# Patient Record
Sex: Female | Born: 1960 | Race: White | Hispanic: No | Marital: Married | Smoking: Current every day smoker
Health system: Southern US, Community
[De-identification: ages and names within clinical notes are randomized; demographics above are authoritative.]

## PROBLEM LIST (undated history)

## (undated) DIAGNOSIS — F32A Depression, unspecified: Secondary | ICD-10-CM

## (undated) DIAGNOSIS — K5792 Diverticulitis of intestine, part unspecified, without perforation or abscess without bleeding: Secondary | ICD-10-CM

## (undated) DIAGNOSIS — G8929 Other chronic pain: Secondary | ICD-10-CM

## (undated) DIAGNOSIS — M545 Low back pain, unspecified: Secondary | ICD-10-CM

## (undated) DIAGNOSIS — E785 Hyperlipidemia, unspecified: Secondary | ICD-10-CM

## (undated) DIAGNOSIS — IMO0002 Reserved for concepts with insufficient information to code with codable children: Secondary | ICD-10-CM

## (undated) DIAGNOSIS — M329 Systemic lupus erythematosus, unspecified: Secondary | ICD-10-CM

## (undated) DIAGNOSIS — F419 Anxiety disorder, unspecified: Secondary | ICD-10-CM

## (undated) DIAGNOSIS — N3281 Overactive bladder: Secondary | ICD-10-CM

## (undated) DIAGNOSIS — F329 Major depressive disorder, single episode, unspecified: Secondary | ICD-10-CM

## (undated) DIAGNOSIS — N3090 Cystitis, unspecified without hematuria: Secondary | ICD-10-CM

## (undated) DIAGNOSIS — K219 Gastro-esophageal reflux disease without esophagitis: Secondary | ICD-10-CM

## (undated) DIAGNOSIS — M359 Systemic involvement of connective tissue, unspecified: Secondary | ICD-10-CM

## (undated) DIAGNOSIS — M542 Cervicalgia: Secondary | ICD-10-CM

## (undated) DIAGNOSIS — U071 COVID-19: Secondary | ICD-10-CM

## (undated) HISTORY — PX: COLONOSCOPY: SHX174

## (undated) HISTORY — PX: TONSILLECTOMY: SUR1361

## (undated) HISTORY — DX: Overactive bladder: N32.81

## (undated) HISTORY — PX: ABDOMINAL HYSTERECTOMY: SHX81

## (undated) HISTORY — DX: Cystitis, unspecified without hematuria: N30.90

---

## 2003-11-11 ENCOUNTER — Emergency Department: Payer: Self-pay | Admitting: Unknown Physician Specialty

## 2003-11-15 ENCOUNTER — Ambulatory Visit: Payer: Self-pay | Admitting: Unknown Physician Specialty

## 2004-11-02 ENCOUNTER — Emergency Department: Payer: Self-pay | Admitting: General Practice

## 2004-11-28 ENCOUNTER — Emergency Department: Payer: Self-pay | Admitting: Emergency Medicine

## 2006-04-23 ENCOUNTER — Emergency Department: Payer: Self-pay | Admitting: Emergency Medicine

## 2006-05-15 ENCOUNTER — Emergency Department: Payer: Self-pay | Admitting: Emergency Medicine

## 2012-01-07 DIAGNOSIS — F411 Generalized anxiety disorder: Secondary | ICD-10-CM | POA: Insufficient documentation

## 2012-02-18 DIAGNOSIS — M329 Systemic lupus erythematosus, unspecified: Secondary | ICD-10-CM | POA: Insufficient documentation

## 2012-06-04 DIAGNOSIS — M797 Fibromyalgia: Secondary | ICD-10-CM | POA: Insufficient documentation

## 2012-08-17 DIAGNOSIS — R32 Unspecified urinary incontinence: Secondary | ICD-10-CM | POA: Insufficient documentation

## 2012-08-17 DIAGNOSIS — G8929 Other chronic pain: Secondary | ICD-10-CM | POA: Insufficient documentation

## 2012-10-04 DIAGNOSIS — M1990 Unspecified osteoarthritis, unspecified site: Secondary | ICD-10-CM | POA: Insufficient documentation

## 2012-10-04 DIAGNOSIS — K5792 Diverticulitis of intestine, part unspecified, without perforation or abscess without bleeding: Secondary | ICD-10-CM | POA: Insufficient documentation

## 2013-11-11 DIAGNOSIS — L930 Discoid lupus erythematosus: Secondary | ICD-10-CM | POA: Insufficient documentation

## 2014-01-25 DIAGNOSIS — E785 Hyperlipidemia, unspecified: Secondary | ICD-10-CM | POA: Insufficient documentation

## 2014-11-24 DIAGNOSIS — Z0289 Encounter for other administrative examinations: Secondary | ICD-10-CM | POA: Insufficient documentation

## 2015-06-06 DIAGNOSIS — L930 Discoid lupus erythematosus: Secondary | ICD-10-CM | POA: Insufficient documentation

## 2015-06-06 DIAGNOSIS — Z79899 Other long term (current) drug therapy: Secondary | ICD-10-CM | POA: Insufficient documentation

## 2015-06-09 ENCOUNTER — Ambulatory Visit: Payer: Self-pay | Admitting: Family Medicine

## 2015-06-20 DIAGNOSIS — E7800 Pure hypercholesterolemia, unspecified: Secondary | ICD-10-CM | POA: Insufficient documentation

## 2015-09-07 ENCOUNTER — Other Ambulatory Visit: Payer: Self-pay | Admitting: Family Medicine

## 2015-09-07 DIAGNOSIS — G8929 Other chronic pain: Secondary | ICD-10-CM

## 2015-09-07 DIAGNOSIS — M542 Cervicalgia: Principal | ICD-10-CM

## 2015-09-07 DIAGNOSIS — M546 Pain in thoracic spine: Secondary | ICD-10-CM

## 2015-09-07 DIAGNOSIS — R9389 Abnormal findings on diagnostic imaging of other specified body structures: Secondary | ICD-10-CM

## 2015-09-20 ENCOUNTER — Ambulatory Visit
Admission: RE | Admit: 2015-09-20 | Discharge: 2015-09-20 | Disposition: A | Discharge status: Home / Self Care | Payer: Medicaid Other | Source: Ambulatory Visit | Attending: Family Medicine | Admitting: Family Medicine

## 2015-09-20 DIAGNOSIS — M542 Cervicalgia: Secondary | ICD-10-CM | POA: Insufficient documentation

## 2015-09-20 DIAGNOSIS — G8929 Other chronic pain: Secondary | ICD-10-CM

## 2015-09-20 DIAGNOSIS — M5124 Other intervertebral disc displacement, thoracic region: Secondary | ICD-10-CM | POA: Diagnosis not present

## 2015-09-20 DIAGNOSIS — R9389 Abnormal findings on diagnostic imaging of other specified body structures: Secondary | ICD-10-CM

## 2015-09-20 DIAGNOSIS — M519 Unspecified thoracic, thoracolumbar and lumbosacral intervertebral disc disorder: Secondary | ICD-10-CM | POA: Diagnosis not present

## 2015-09-20 DIAGNOSIS — R937 Abnormal findings on diagnostic imaging of other parts of musculoskeletal system: Secondary | ICD-10-CM | POA: Diagnosis not present

## 2015-09-20 DIAGNOSIS — M4802 Spinal stenosis, cervical region: Secondary | ICD-10-CM | POA: Diagnosis not present

## 2015-09-20 DIAGNOSIS — M546 Pain in thoracic spine: Secondary | ICD-10-CM

## 2015-10-09 ENCOUNTER — Ambulatory Visit
Admission: EM | Admit: 2015-10-09 | Discharge: 2015-10-09 | Disposition: A | Discharge status: Home / Self Care | Payer: Medicaid Other | Attending: Family Medicine | Admitting: Family Medicine

## 2015-10-09 DIAGNOSIS — M5090 Cervical disc disorder, unspecified, unspecified cervical region: Secondary | ICD-10-CM

## 2015-10-09 DIAGNOSIS — M542 Cervicalgia: Secondary | ICD-10-CM | POA: Diagnosis not present

## 2015-10-09 HISTORY — DX: Systemic lupus erythematosus, unspecified: M32.9

## 2015-10-09 HISTORY — DX: Major depressive disorder, single episode, unspecified: F32.9

## 2015-10-09 HISTORY — DX: Anxiety disorder, unspecified: F41.9

## 2015-10-09 HISTORY — DX: Depression, unspecified: F32.A

## 2015-10-09 HISTORY — DX: Hyperlipidemia, unspecified: E78.5

## 2015-10-09 HISTORY — DX: Reserved for concepts with insufficient information to code with codable children: IMO0002

## 2015-10-09 MED ORDER — CYCLOBENZAPRINE HCL 10 MG PO TABS: 10.0000 mg | ORAL_TABLET | Freq: Three times a day (TID) | ORAL | 0 refills | Status: DC | PRN

## 2015-10-09 MED ORDER — KETOROLAC TROMETHAMINE 60 MG/2ML IM SOLN
60.0000 mg | Freq: Once | INTRAMUSCULAR | Status: AC
  Administered 2015-10-09: 60 mg via INTRAMUSCULAR

## 2015-10-09 NOTE — ED Triage Notes (Signed)
Patient complains of anxiety and stress. Patient states that she went off of her Cymbalta cold [place] one month ago. Patient states that she is going to see her primary MD tomorrow about starting something else. Patient states that the neck pain has been a constant pain for a while and is scheduled to see a pain management provider in November. Patient states that she cannot handle the stress or pain any longer.

## 2015-10-09 NOTE — ED Provider Notes (Signed)
[facility]    CSN: [id number] Arrival date & time: 10/09/15  1242  First Provider Contact:  None       History   Chief Complaint Chief Complaint  Patient presents with  . Neck Pain  . Anxiety    HPI Tanya [name] is a 55 y.o. female.   The history is provided by the patient.  Neck Pain  Pain location:  Generalized neck Quality:  Stiffness and aching Stiffness is present:  All day Pain radiates to:  Head Pain severity:  Moderate Pain is:  Same all the time Onset quality:  Gradual Timing:  Constant Progression:  Worsening Chronicity:  Chronic Context: not fall, not jumping from heights, not lifting a heavy object, not MCA, not MVC, not pedestrian accident and not recent injury   Relieved by:  Nothing Associated symptoms: no bladder incontinence, no bowel incontinence, no headaches, no numbness, no paresis, no tingling and no weakness   Anxiety  Pertinent negatives include no headaches.    Past Medical History:  Diagnosis Date  . Anxiety   . Depression   . Hyperlipidemia   . Lupus ([place])     There are no active problems to display for this patient.   Past Surgical History:  Procedure Laterality Date  . ABDOMINAL HYSTERECTOMY      OB History    No data available       Home Medications    Prior to Admission medications   Medication Sig Start Date End Date Taking? Authorizing Provider  aspirin 81 MG chewable tablet Chew 81 mg by mouth daily.   Yes Historical Provider, MD  atorvastatin (LIPITOR) 40 MG tablet Take 40 mg by mouth daily.   Yes Historical Provider, MD  gabapentin (NEURONTIN) 300 MG capsule Take 300 mg by mouth 3 (three) times daily.   Yes Historical Provider, MD  hydrOXYzine (ATARAX/VISTARIL) 25 MG tablet Take 25 mg by mouth 3 (three) times daily as needed.   Yes Historical Provider, MD  cyclobenzaprine (FLEXERIL) 10 MG tablet Take 1 tablet (10 mg total) by mouth 3 (three) times daily as needed for muscle spasms. 10/09/15    Norval Gable, MD    Family History History reviewed. No pertinent family history.  Social History Social History  Substance Use Topics  . Smoking status: Current Every Day Smoker    Packs/day: 0.50    Types: Cigarettes  . Smokeless tobacco: Never Used  . Alcohol use No     Allergies   Trazodone and nefazodone and Tramadol   Review of Systems Review of Systems  Gastrointestinal: Negative for bowel incontinence.  Genitourinary: Negative for bladder incontinence.  Musculoskeletal: Positive for neck pain.  Neurological: Negative for tingling, weakness, numbness and headaches.     Physical Exam Triage Vital Signs ED Triage Vitals  Enc Vitals Group     BP 10/09/15 1313 (!) 149/92     Pulse Rate 10/09/15 1313 88     Resp 10/09/15 1313 18     Temp 10/09/15 1313 97.2 F (36.2 C)     Temp Source 10/09/15 1313 Tympanic     SpO2 10/09/15 1313 99 %     Weight 10/09/15 1315 145 lb (65.8 kg)     Height 10/09/15 1315 5\' 3"  (1.6 m)     Head Circumference --      Peak Flow --      Pain Score 10/09/15 1321 10     Pain Loc --      Pain  Edu? --      Excl. in [place]? --    No data found.   Updated Vital Signs BP (!) 149/92 (BP Location: Left Arm)   Pulse 88   Temp 97.2 F (36.2 C) (Tympanic)   Resp 18   Ht 5\' 3"  (1.6 m)   Wt 145 lb (65.8 kg)   SpO2 99%   BMI 25.69 kg/m   Visual Acuity Right Eye Distance:   Left Eye Distance:   Bilateral Distance:    Right Eye Near:   Left Eye Near:    Bilateral Near:     Physical Exam  Constitutional: She appears well-developed and well-nourished. No distress.  Musculoskeletal:       Cervical back: She exhibits tenderness (over the paraspinous muscles bilaterally) and spasm. She exhibits normal range of motion, no bony tenderness, no swelling, no edema, no deformity, no laceration, no pain and normal pulse.  Skin: She is not diaphoretic.  Nursing note and vitals reviewed.    UC Treatments / Results  Labs (all labs ordered  are listed, but only abnormal results are displayed) Labs Reviewed - No data to display  EKG  EKG Interpretation None       Radiology No results found.  Procedures Procedures (including critical care time)  Medications Ordered in UC Medications  ketorolac (TORADOL) injection 60 mg (60 mg Intramuscular Given 10/09/15 1404)     Initial Impression / Assessment and Plan / UC Course  I have reviewed the triage vital signs and the nursing notes.  Pertinent labs & imaging results that were available during my care of the patient were reviewed by me and considered in my medical decision making (see chart for details).  Clinical Course      Final Clinical Impressions(s) / UC Diagnoses   Final diagnoses:  Neck pain  Cervical disc disease    New Prescriptions Discharge Medication List as of 10/09/2015  2:30 PM    START taking these medications   Details  cyclobenzaprine (FLEXERIL) 10 MG tablet Take 1 tablet (10 mg total) by mouth 3 (three) times daily as needed for muscle spasms., Starting Mon 10/09/2015, Normal       1. diagnosis reviewed with patient; patient given toradol 60mg  IM x1 with improvement of symptoms 2. rx as per orders above; reviewed possible side effects, interactions, risks and benefits  3. Recommend supportive treatment with otc analgesics 4. Follow-up prn if symptoms worsen or don't improve   Norval Gable, MD 10/09/15 2115

## 2015-11-23 ENCOUNTER — Encounter: Payer: Self-pay | Admitting: Pain Medicine

## 2015-11-23 ENCOUNTER — Ambulatory Visit: Payer: Medicaid Other | Attending: Pain Medicine | Admitting: Pain Medicine

## 2015-11-23 ENCOUNTER — Other Ambulatory Visit
Admission: RE | Admit: 2015-11-23 | Discharge: 2015-11-23 | Disposition: A | Discharge status: Home / Self Care | Payer: Medicaid Other | Source: Ambulatory Visit | Attending: Pain Medicine | Admitting: Pain Medicine

## 2015-11-23 ENCOUNTER — Ambulatory Visit
Admission: RE | Admit: 2015-11-23 | Discharge: 2015-11-23 | Disposition: A | Discharge status: Home / Self Care | Payer: Medicaid Other | Source: Ambulatory Visit | Attending: Pain Medicine | Admitting: Pain Medicine

## 2015-11-23 VITALS — BP 128/80 | HR 80 | Temp 97.7°F | Resp 16 | Ht 62.0 in | Wt 145.0 lb

## 2015-11-23 DIAGNOSIS — Z0001 Encounter for general adult medical examination with abnormal findings: Secondary | ICD-10-CM | POA: Insufficient documentation

## 2015-11-23 DIAGNOSIS — M25512 Pain in left shoulder: Secondary | ICD-10-CM

## 2015-11-23 DIAGNOSIS — Z79899 Other long term (current) drug therapy: Secondary | ICD-10-CM | POA: Diagnosis not present

## 2015-11-23 DIAGNOSIS — M150 Primary generalized (osteo)arthritis: Secondary | ICD-10-CM

## 2015-11-23 DIAGNOSIS — M79602 Pain in left arm: Secondary | ICD-10-CM

## 2015-11-23 DIAGNOSIS — F1190 Opioid use, unspecified, uncomplicated: Secondary | ICD-10-CM

## 2015-11-23 DIAGNOSIS — M25511 Pain in right shoulder: Secondary | ICD-10-CM | POA: Diagnosis not present

## 2015-11-23 DIAGNOSIS — M19011 Primary osteoarthritis, right shoulder: Secondary | ICD-10-CM | POA: Insufficient documentation

## 2015-11-23 DIAGNOSIS — M159 Polyosteoarthritis, unspecified: Secondary | ICD-10-CM

## 2015-11-23 DIAGNOSIS — M79603 Pain in arm, unspecified: Secondary | ICD-10-CM

## 2015-11-23 DIAGNOSIS — M545 Low back pain, unspecified: Secondary | ICD-10-CM | POA: Insufficient documentation

## 2015-11-23 DIAGNOSIS — M549 Dorsalgia, unspecified: Secondary | ICD-10-CM

## 2015-11-23 DIAGNOSIS — M5412 Radiculopathy, cervical region: Secondary | ICD-10-CM | POA: Insufficient documentation

## 2015-11-23 DIAGNOSIS — M797 Fibromyalgia: Secondary | ICD-10-CM | POA: Insufficient documentation

## 2015-11-23 DIAGNOSIS — Z79891 Long term (current) use of opiate analgesic: Secondary | ICD-10-CM | POA: Insufficient documentation

## 2015-11-23 DIAGNOSIS — F419 Anxiety disorder, unspecified: Secondary | ICD-10-CM | POA: Insufficient documentation

## 2015-11-23 DIAGNOSIS — G8929 Other chronic pain: Secondary | ICD-10-CM | POA: Insufficient documentation

## 2015-11-23 DIAGNOSIS — G894 Chronic pain syndrome: Secondary | ICD-10-CM | POA: Diagnosis not present

## 2015-11-23 DIAGNOSIS — M542 Cervicalgia: Secondary | ICD-10-CM

## 2015-11-23 DIAGNOSIS — M19012 Primary osteoarthritis, left shoulder: Secondary | ICD-10-CM | POA: Diagnosis not present

## 2015-11-23 DIAGNOSIS — M546 Pain in thoracic spine: Secondary | ICD-10-CM | POA: Insufficient documentation

## 2015-11-23 DIAGNOSIS — M79601 Pain in right arm: Secondary | ICD-10-CM

## 2015-11-23 LAB — C-REACTIVE PROTEIN

## 2015-11-23 LAB — COMPREHENSIVE METABOLIC PANEL
ALK PHOS: 35 U/L — AB (ref 38–126)
ALT: 15 U/L (ref 14–54)
ANION GAP: 12 (ref 5–15)
AST: 23 U/L (ref 15–41)
Albumin: 4.6 g/dL (ref 3.5–5.0)
BUN: 10 mg/dL (ref 6–20)
CALCIUM: 9.5 mg/dL (ref 8.9–10.3)
CO2: 24 mmol/L (ref 22–32)
CREATININE: 0.67 mg/dL (ref 0.44–1.00)
Chloride: 102 mmol/L (ref 101–111)
Glucose, Bld: 79 mg/dL (ref 65–99)
Potassium: 4 mmol/L (ref 3.5–5.1)
SODIUM: 138 mmol/L (ref 135–145)
Total Bilirubin: 0.5 mg/dL (ref 0.3–1.2)
Total Protein: 7.6 g/dL (ref 6.5–8.1)

## 2015-11-23 LAB — SEDIMENTATION RATE: SED RATE: 8 mm/h (ref 0–30)

## 2015-11-23 LAB — MAGNESIUM: MAGNESIUM: 2.1 mg/dL (ref 1.7–2.4)

## 2015-11-23 LAB — VITAMIN B12: Vitamin B-12: 318 pg/mL (ref 180–914)

## 2015-11-23 NOTE — Patient Instructions (Signed)

## 2015-11-23 NOTE — Progress Notes (Signed)
Patient's Name: Tanya [name]  MRN: [id number]  Referring Provider: Ricardo [name]*  DOB: [birth year]  PCP: Tanya Mouse, NP  DOS: 11/23/2015  Note by: Kathlen Brunswick. Dossie Arbour, MD  Service setting: Ambulatory outpatient  Specialty: Interventional Pain Management  Location: [facility] (AMB) Pain Management Facility    Patient type: New Patient   Primary Reason(s) for Visit: Initial Patient Evaluation CC: Neck Pain; Back Pain (all of back); and Shoulder Pain (bilateral right is worse)  HPI  [name] is a 55 y.o. year old, female patient, who comes today for an initial evaluation. She has Anxiety; Chronic upper back pain (Location of Tertiary source of pain) (Bilateral) (R>L); Chronic discoid lupus erythematosus; Chronic low back pain (Bilateral) (R>L); Chronic neck pain (Location of Secondary source of pain) (Bilateral) (R>L); Discoid lupus; Diverticulitis; Encounter for long-term (current) use of high-risk medication; Fibromyalgia; Generalized anxiety disorder; Hypercholesterolemia; Hyperlipidemia; Major depressive disorder, recurrent ([place]); Pain medication agreement signed; Osteoarthritis; SLE (systemic lupus erythematosus) ([place]); Urinary incontinence; Long term prescription benzodiazepine use; Long term current use of opiate analgesic; Long term prescription opiate use; Opiate use (80 MME/Day); Chronic pain syndrome; Chronic upper extremity pain (Right); Chronic cervical radicular pain (Right); Chronic shoulder pain (Location of Primary Source of Pain) (Bilateral) (R>L); and 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 Her primarily concern today is the Neck Pain; Back Pain (all of back); and Shoulder Pain (bilateral right is worse)  Pain Assessment: Self-Reported Pain Score: 5 /10             Reported level is compatible with observation.       Pain Type: Chronic pain Pain Location: Neck (entire back) Pain Orientation: Right Pain Descriptors / Indicators: Numbness,  Constant, Burning, Aching Pain Frequency: Constant  Onset and Duration: Gradual and Date of onset: 2012 Cause of pain: Unknown Severity: Getting worse, NAS-11 at its worse: 10/10, NAS-11 at its best: 7/10, NAS-11 now: 10/10 and NAS-11 on the average: 9/10 Timing: Not influenced by the time of the day and During activity or exercise Aggravating Factors: sitting Alleviating Factors: Lying down Associated Problems: Depression, Inability to concentrate, Numbness, Sadness, Spasms, Sweating, Tingling and Pain that wakes patient up Quality of Pain: Aching, Burning, Deep, Throbbing and Tingling Previous Examinations or Tests: MRI scan and X-rays Previous Treatments: The patient denies treatments  The patient comes into the clinics today for the first time for a chronic pain management evaluation. The patient describes the primary pain to be that of the shoulders with the right side being worst on the left. This pain started around 2012. The onset was gradual and the etiology was unknown. The patient denies having had any shoulder surgeries or nerve blocks. The patient's second worst pain is that of the neck which is located in the posterior aspect of the neck and with the right side being worst on the left. She denies any surgeries or nerve blocks. The patient indicates having headaches through the posterior and lateral aspects of the head with the right side being worse than the left in the back and the left being worst on the right in the sites. Next is the upper back pain which is worse on the right side. The patient denies any prior surgeries or nerve blocks. Next is the low back pain with the right being worst on the left no prior surgeries but the patient does admit to having had nerve blocks at [facility]. She indicates having had some lumbar epidural steroid injections which  did not help but she also indicated no side effects from them. In addition to this the patient complains of right upper  extremity pain down to the elbow to the back of the upper arm and through the inside portion of the upper arm she indicates having some numbness and weakness with decreased grip strength.  When asked about the medications the patient indicated taking 900 mg of gabapentin 3 times a day, to extra strength Tylenol, Flexeril 10 2-3 tablets per day, meloxicam 15 mg daily, and seemed about the 30 mg by mouth daily. She denies any prior physical therapy.  Today I took the time to provide the patient with information regarding my pain practice. The patient was informed that my practice is divided into two sections: an interventional pain management section, as well as a completely separate and distinct medication management section. The interventional portion of my practice takes place on Tuesdays and Thursdays, while the medication management is conducted on Mondays and Wednesdays. Because of the amount of documentation required on both them, they are kept separated. This means that there is the possibility that the patient may be scheduled for a procedure on Tuesday, while also having a medication management appointment on Wednesday. I have also informed the patient that because of current staffing and facility limitations, I no longer take patients for medication management only. To illustrate the reasons for this, I gave the patient the example of a surgeon and how inappropriate it would be to refer a patient to his/her practice so that they write for the post-procedure antibiotics on a surgery done by someone else.   The patient was informed that joining my practice means that they are open to any and all interventional therapies. I clarified for the patient that this does not mean that they will be forced to have any procedures done. What it means is that patients looking for a practitioner to simply write for their pain medications and not take advantage of other interventional techniques will be better served  by a different practitioner, other than myself. I made it clear that I prefer to spend my time providing those services that I specialize in.  The patient was also made aware of my Comprehensive Pain Management Safety Guidelines where by joining my practice, they limit all of their nerve blocks and joint injections to those done by our practice, for as long as we are retained to manage their care.   Historic Controlled Substance Pharmacotherapy Review  PMP and historical list of controlled substances: Hydrocodone/APAP 5/325; diazepam 2 mg; oxycodone 5 mg; oxycodone/APAP 5/325; alprazolam 0.5 mg; tramadol 50 mg to my: And lorazepam 0.5 mg Highest analgesic regimen found: Hydrocodone/APAP 5/325 16 tablets per day. Most recent analgesic: Hydrocodone/APAP 5/325 Highest recorded MME/day: 80 mg/day MME/day: 0 mg/day Medications: The patient did not bring the medication(s) to the appointment, as requested in our "New Patient Package" Pharmacodynamics: Desired effects: Analgesia: The patient reports >50% benefit. Reported improvement in function: The patient reports medication allows her to accomplish basic ADLs. Clinically meaningful improvement in function (CMIF): Sustained CMIF goals met Perceived effectiveness: Described as relatively effective, allowing for increase in activities of daily living (ADL) Undesirable effects: Side-effects or Adverse reactions: None reported Historical Monitoring: The patient  reports that she does not use drugs.. No results found for: MDMA, COCAINSCRNUR, PCPSCRNUR, THCU, ETH Historical Background Evaluation: [place] PDMP: Six (6) year initial data search conducted.             [place] Department of  public safety, offender search: Editor, commissioning Information) Non-contributory Risk Assessment Profile: Aberrant behavior: None observed or detected today Risk factors for fatal opioid overdose: None identified today Fatal overdose hazard ratio (HR): Calculation deferred Non-fatal  overdose hazard ratio (HR): Calculation deferred Risk of opioid abuse or dependence: 0.7-3.0% with doses ? 36 MME/day and 6.1-26% with doses ? 120 MME/day. Substance use disorder (SUD) risk level: Pending results of Medical Psychology Evaluation for SUD Opioid risk tool (ORT) (Total Score): 4  ORT Scoring interpretation table:  Score <3 = Low Risk for SUD  Score between 4-7 = Moderate Risk for SUD  Score >8 = High Risk for Opioid Abuse   PHQ-2 Depression Scale:  Total score: 0  PHQ-2 Scoring interpretation table: (Score and probability of major depressive disorder)  Score 0 = No depression  Score 1 = 15.4% Probability  Score 2 = 21.1% Probability  Score 3 = 38.4% Probability  Score 4 = 45.5% Probability  Score 5 = 56.4% Probability  Score 6 = 78.6% Probability   PHQ-9 Depression Scale:  Total score: 0  PHQ-9 Scoring interpretation table:  Score 0-4 = No depression  Score 5-9 = Mild depression  Score 10-14 = Moderate depression  Score 15-19 = Moderately severe depression  Score 20-27 = Severe depression (2.4 times higher risk of SUD and 2.89 times higher risk of overuse)   Pharmacologic Plan: Pending ordered tests and/or consults  Meds  The patient has a current medication list which includes the following prescription(s): atorvastatin, cyclobenzaprine, duloxetine, gabapentin, and meloxicam.  Current Outpatient Prescriptions on File Prior to Visit  Medication Sig  . atorvastatin (LIPITOR) 40 MG tablet Take 40 mg by mouth daily.  . cyclobenzaprine (FLEXERIL) 10 MG tablet Take 1 tablet (10 mg total) by mouth 3 (three) times daily as needed for muscle spasms.  Marland Kitchen gabapentin (NEURONTIN) 300 MG capsule Take 300 mg by mouth 3 (three) times daily.   No current facility-administered medications on file prior to visit.    Imaging Review  Cervical Imaging: Cervical MR wo contrast:  Results for orders placed during the hospital encounter of 09/20/15  MR Cervical Spine Wo Contrast    Narrative CLINICAL DATA:  Long history of neck and back pain.  EXAM: MRI CERVICAL, THORACIC  WITHOUT CONTRAST  TECHNIQUE: Multiplanar and multiecho pulse sequences of the cervical spine, to include the craniocervical junction and cervicothoracic junction and thoracic spine were obtained without intravenous contrast.  COMPARISON:  None.  FINDINGS: MRI CERVICAL SPINE FINDINGS  Examination is limited due to patient motion and poor image quality.  Alignment: Normal.  Vertebrae: Grossly normal.  Cord: Grossly normal.  Posterior Fossa, vertebral arteries, paraspinal tissues: No significant findings.  Disc levels:  C2-3:  No significant findings.  C3-4: Shallow central disc protrusion with mild mass effect on the ventral thecal sac. Mild narrowing of the ventral CSF space. Mild right foraminal narrowing due to uncinate spurring.  C4-5: Bulging annulus, osteophytic ridging and uncinate spurring with mass effect on the ventral thecal sac, asymmetric right. Narrowing of the ventral CSF space. Moderate bilateral foraminal stenosis.  C5-6: Shallow central disc protrusion, osteophytic ridging and uncinate spurring with flattening of the ventral thecal sac and narrowing of the ventral CSF space. Moderate left and mild right foraminal stenosis.  C6-7: Broad-based disc protrusion asymmetrical left with mass effect on the thecal sac and narrowing of the ventral CSF space. No significant foraminal stenosis.  C7-T1: No significant findings.  MRI THORACIC SPINE FINDINGS  Alignment:  Normal  Vertebrae: Normal  Cord:  Normal  Paraspinal and other soft tissues: Unremarkable  Disc levels:  No significant thoracic disc protrusions, spinal or foraminal stenosis. Shallow central disc protrusion noted at T7-8 with minimal impression on the ventral thecal sac.  IMPRESSION: 1. Multilevel disc disease in the cervical spine with disc protrusions, bulging degenerated discs,  osteophytic ridging and uncinate spurring with mild multilevel spinal and foraminal stenosis as discussed above at the individual levels. 2. Normal thoracic spine examination except for mild disc disease and facet disease and a shallow central disc protrusion at T7-8.   Electronically Signed   By: Marijo Sanes M.D.   On: 09/20/2015 11:04    Thoracic Imaging: Thoracic MR wo contrast:  Results for orders placed during the hospital encounter of 09/20/15  MR Thoracic Spine Wo Contrast   Narrative CLINICAL DATA:  Long history of neck and back pain.  EXAM: MRI CERVICAL, THORACIC  WITHOUT CONTRAST  TECHNIQUE: Multiplanar and multiecho pulse sequences of the cervical spine, to include the craniocervical junction and cervicothoracic junction and thoracic spine were obtained without intravenous contrast.  COMPARISON:  None.  FINDINGS: MRI CERVICAL SPINE FINDINGS  Examination is limited due to patient motion and poor image quality.  Alignment: Normal.  Vertebrae: Grossly normal.  Cord: Grossly normal.  Posterior Fossa, vertebral arteries, paraspinal tissues: No significant findings.  Disc levels:  C2-3:  No significant findings.  C3-4: Shallow central disc protrusion with mild mass effect on the ventral thecal sac. Mild narrowing of the ventral CSF space. Mild right foraminal narrowing due to uncinate spurring.  C4-5: Bulging annulus, osteophytic ridging and uncinate spurring with mass effect on the ventral thecal sac, asymmetric right. Narrowing of the ventral CSF space. Moderate bilateral foraminal stenosis.  C5-6: Shallow central disc protrusion, osteophytic ridging and uncinate spurring with flattening of the ventral thecal sac and narrowing of the ventral CSF space. Moderate left and mild right foraminal stenosis.  C6-7: Broad-based disc protrusion asymmetrical left with mass effect on the thecal sac and narrowing of the ventral CSF space. No significant  foraminal stenosis.  C7-T1: No significant findings.  MRI THORACIC SPINE FINDINGS  Alignment:  Normal  Vertebrae: Normal  Cord:  Normal  Paraspinal and other soft tissues: Unremarkable  Disc levels:  No significant thoracic disc protrusions, spinal or foraminal stenosis. Shallow central disc protrusion noted at T7-8 with minimal impression on the ventral thecal sac.  IMPRESSION: 1. 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 2. Normal thoracic spine examination except for mild disc disease and facet disease and a shallow central disc protrusion at T7-8.   Electronically Signed   By: Marijo Sanes M.D.   On: 09/20/2015 11:04    Note: Imaging results reviewed.  ROS  Cardiovascular History: Negative for hypertension, coronary artery diseas, myocardial infraction, anticoagulant therapy or heart failure Pulmonary or Respiratory History: Negative for bronchial asthma, emphysema, chronic smoking, chronic bronchitis, sarcoidosis, tuberculosis or sleep apena Neurological History: Negative for epilepsy, stroke, urinary or fecal inontinence, spina bifida or tethered cord syndrome Review of Past Neurological Studies: No results found for this or any previous visit. Psychological-Psychiatric History: Anxiety, Depression and Panic Attacks Gastrointestinal History: Negative for peptic ulcer disease, hiatal hernia, GERD, IBS, hepatitis, cirrhosis or pancreatitis Genitourinary History: Negative for nephrolithiasis, hematuria, renal failure or chronic kidney disease Hematological History: Negative for anticoagulant therapy, anemia, bruising  or bleeding easily, hemophilia, sickle cell disease or trait, thrombocytopenia or coagulupathies Endocrine History: Negative for diabetes or thyroid  disease Rheumatologic History: Lupus, Rheumatoid arthritis and Fibromyalgia Musculoskeletal History: Negative for myasthenia gravis, muscular dystrophy, multiple sclerosis or malignant hyperthermia Work History: Disabled  Allergies  Ms. Lingenfelter is allergic to trazodone and nefazodone and tramadol.  Laboratory Chemistry  Inflammation Markers Lab Results  Component Value Date   ESRSEDRATE 8 11/23/2015   CRP <0.8 11/23/2015   Renal Function Lab Results  Component Value Date   BUN 10 11/23/2015   CREATININE 0.67 11/23/2015   GFRAA >60 11/23/2015   GFRNONAA >60 11/23/2015   Hepatic Function Lab Results  Component Value Date   AST 23 11/23/2015   ALT 15 11/23/2015   ALBUMIN 4.6 11/23/2015   Electrolytes Lab Results  Component Value Date   NA 138 11/23/2015   K 4.0 11/23/2015   CL 102 11/23/2015   CALCIUM 9.5 11/23/2015   MG 2.1 11/23/2015   Pain Modulating Vitamins Lab Results  Component Value Date   25OHVITD1 28 (L) 11/23/2015   25OHVITD2 <1.0 11/23/2015   25OHVITD3 28 11/23/2015   VITAMINB12 318 11/23/2015   Coagulation Parameters No results found for: INR, LABPROT, APTT, PLT Cardiovascular No results found for: BNP, HGB, HCT Note: Lab results reviewed.  [place]  Drug: Ms. Wolaver  reports that she does not use drugs. Alcohol:  reports that she does not drink alcohol. Tobacco:  reports that she has been smoking Cigarettes.  She has been smoking about 0.50 packs per day. She has never used smokeless tobacco. Medical:  has a past medical history of Anxiety; Depression; Hyperlipidemia; and Lupus. Family: family history includes Cancer in her mother; Diabetes in her brother; Emphysema in her mother; Glaucoma in her father; Heart disease in her father; Hypertension in her mother; Stroke in her mother.  Past Surgical History:  Procedure Laterality Date  . ABDOMINAL HYSTERECTOMY     Active Ambulatory Problems    Diagnosis Date Noted  . Anxiety 11/23/2015  .  Chronic upper back pain (Location of Tertiary source of pain) (Bilateral) (R>L) 08/17/2012  . 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 Chronic low back pain (Bilateral) (R>L) 11/23/2015  . Chronic neck pain (Location of Secondary source of pain) (Bilateral) (R>L) 11/23/2015  . 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 Diverticulitis 10/04/2012  . Encounter for long-term (current) use of high-risk medication 06/06/2015  . Fibromyalgia 06/04/2012  . Generalized anxiety disorder 01/07/2012  . Hypercholesterolemia 06/20/2015  . Hyperlipidemia 01/25/2014  . Major depressive disorder, recurrent ([place]) 09/29/2009  . Pain medication agreement signed 11/24/2014  . Osteoarthritis 10/04/2012  . SLE (systemic lupus erythematosus) ([place]) 02/18/2012  . Urinary incontinence 08/17/2012  . Long term prescription benzodiazepine use 11/23/2015  . Long term current use of opiate analgesic 11/23/2015  . Long term prescription opiate use 11/23/2015  . Opiate use (80 MME/Day) 11/23/2015  . Chronic pain syndrome 11/23/2015  . Chronic upper extremity pain (Right) 11/23/2015  . Chronic cervical radicular pain (Right) 11/23/2015  . Chronic shoulder pain (Location of Primary Source of Pain) (Bilateral) (R>L) 11/23/2015  . 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 No Resolved Ambulatory Problems   Past Medical History:  Diagnosis Date  . Anxiety   . Depression   . Hyperlipidemia   . Lupus    Constitutional Exam  General appearance: Well nourished, well developed, and well hydrated. In no apparent acute distress  Vitals:   11/23/15 1150  BP: 128/80  Pulse: 80  Resp: 16  Temp: 97.7 F (36.5 C)  TempSrc: Oral  SpO2: 97%  Weight: 145 lb (65.8 kg)  Height: 5' 2"  (1.575 m)   BMI Assessment: Estimated body mass index is 26.52 kg/m as calculated from the following:   Height as of this encounter: 5' 2"  (1.575 m).   Weight as of this  encounter: 145 lb (65.8 kg).  BMI interpretation table: BMI level Category Range association with higher incidence of chronic pain  <18 kg/m2 Underweight   18.5-24.9 kg/m2 Ideal body weight   25-29.9 kg/m2 Overweight Increased incidence by 20%  30-34.9 kg/m2 Obese (Class I) Increased incidence by 68%  35-39.9 kg/m2 Severe obesity (Class II) Increased incidence by 136%  >40 kg/m2 Extreme obesity (Class III) Increased incidence by 254%   BMI Readings from Last 4 Encounters:  11/23/15 26.52 kg/m  10/09/15 25.69 kg/m   Wt Readings from Last 4 Encounters:  11/23/15 145 lb (65.8 kg)  10/09/15 145 lb (65.8 kg)  Psych/Mental status: Alert, oriented x 3 (person, place, & time) Eyes: PERLA Respiratory: No evidence of acute respiratory distress  Cervical Spine Exam  Inspection: No masses, redness, or swelling Alignment: Symmetrical Functional ROM: Unrestricted ROM Stability: No instability detected Muscle strength & Tone: Functionally intact Sensory: Unimpaired Palpation: Non-contributory  Upper Extremity (UE) Exam    Side: Right upper extremity  Side: Left upper extremity  Inspection: No masses, redness, swelling, or asymmetry  Inspection: No masses, redness, swelling, or asymmetry  Functional ROM: Unrestricted ROM         Functional ROM: Unrestricted ROM          Muscle strength & Tone: Functionally intact  Muscle strength & Tone: Functionally intact  Sensory: Unimpaired  Sensory: Unimpaired  Palpation: Non-contributory  Palpation: Non-contributory   Thoracic Spine Exam  Inspection: No masses, redness, or swelling Alignment: Symmetrical Functional ROM: Unrestricted ROM Stability: No instability detected Sensory: Unimpaired Muscle strength & Tone: Functionally intact Palpation: Non-contributory  Lumbar Spine Exam  Inspection: No masses, redness, or swelling Alignment: Symmetrical Functional ROM: Unrestricted ROM Stability: No instability detected Muscle strength & Tone:  Functionally intact Sensory: Unimpaired Palpation: Non-contributory Provocative Tests: Lumbar Hyperextension and rotation test: evaluation deferred today       Patrick's Maneuver: evaluation deferred today              Gait & Posture Assessment  Ambulation: Unassisted Gait: Relatively normal for age and body habitus Posture: WNL   Lower Extremity Exam    Side: Right lower extremity  Side: Left lower extremity  Inspection: No masses, redness, swelling, or asymmetry  Inspection: No masses, redness, swelling, or asymmetry  Functional ROM: Unrestricted ROM          Functional ROM: Unrestricted ROM          Muscle strength & Tone: Functionally intact  Muscle strength & Tone: Functionally intact  Sensory: Unimpaired  Sensory: Unimpaired  Palpation: Non-contributory  Palpation: Non-contributory   Assessment  Primary Diagnosis & Pertinent Problem List: The primary encounter diagnosis was Chronic pain syndrome. Diagnoses of Long term prescription benzodiazepine use, Long term current use of opiate analgesic, Long term prescription opiate use, Opiate use, Chronic neck pain, Chronic pain of both upper extremities, Chronic cervical radicular pain, Chronic pain of both shoulders, Chronic upper back pain, Chronic bilateral low back pain without sciatica, Fibromyalgia, Primary osteoarthritis involving multiple joints, and Primary osteoarthritis of both shoulders were also pertinent to this  visit.  Visit Diagnosis: 1. Chronic pain syndrome   2. Long term prescription benzodiazepine use   3. Long term current use of opiate analgesic   4. Long term prescription opiate use   5. Opiate use   6. Chronic neck pain   7. Chronic pain of both upper extremities   8. Chronic cervical radicular pain   9. Chronic pain of both shoulders   10. Chronic upper back pain   11. Chronic bilateral low back pain without sciatica   12. Fibromyalgia   13. Primary osteoarthritis involving multiple joints   14. Primary  osteoarthritis of both shoulders    Plan of Care  Initial Treatment Plan:  Please be advised that as per protocol, today's visit has been an evaluation only. We have not taken over the patient's controlled substance management.  Problem-Specific Plan: No problem-specific Assessment & Plan notes found for this encounter.  Ordered Lab-work, Procedure(s), Referral(s), & Consult(s): Orders Placed This Encounter  Procedures  . DG Shoulder Left  . DG Shoulder Right  . Compliance Drug Analysis, Ur  . Comprehensive metabolic panel  . C-reactive protein  . Magnesium  . Sedimentation rate  . Vitamin B12  . 25-Hydroxyvitamin D Lcms D2+D3  . Ambulatory referral to Psychology   Pharmacotherapy: Medications ordered:  No orders of the defined types were placed in this encounter.  Medications administered during this visit: Ms. Machnik had no medications administered during this visit.   Pharmacotherapy under consideration:  Opioid Analgesics: The patient was informed that there is no guarantee that she would be a candidate for opioid analgesics. The decision will be made following CDC guidelines. This decision will be based on the results of diagnostic studies, as well as Ms. Stryker's risk profile.    Interventional therapies: Ms. Brickhouse was informed that there is no guarantee that she would be a candidate for interventional therapies. The decision will be based on the results of diagnostic studies, as well as Ms. Regis's risk profile.  Procedures under consideration include: Diagnostic bilateral intra-articular shoulder joint injection  Diagnostic bilateral suprascapular nerve block  Possible bilateral suprascapular nerve RFA  Right cervical epidural steroid injection  Diagnostic bilateral cervical facet block possible bilateral cervical facet radiofrequency ablation  Diagnostic bilateral lumbar facet block  Possible bilateral lumbar facet radiofrequency ablation     Requested PM Follow-up: Return for 2nd Visit Eval, After [facility]  Future Appointments Date Time Provider Phillips  12/21/2015 2:00 PM Milinda Pointer, MD [facility] None    Primary Care Physician: Tanya Mouse, NP Location: [facility] Note by: Kathlen Brunswick. Dossie Arbour, M.D, DABA, DABAPM, DABPM, DABIPP, FIPP  Pain Score Disclaimer: We use the NRS-11 scale. This is a self-reported, subjective measurement of pain severity with only modest accuracy. It is used primarily to identify changes within a particular patient. It must be understood that outpatient pain scales are significantly less accurate that those used for research, where they can be applied under ideal controlled circumstances with minimal exposure to variables. In reality, the score is likely to be a combination of pain intensity and pain affect, where pain affect describes the degree of emotional arousal or changes in action readiness caused by the sensory experience of pain. Factors such as social and work situation, setting, emotional state, anxiety levels, expectation, and prior pain experience may influence pain perception and show large inter-individual differences that may also be affected by time variables.  Patient instructions provided during this appointment: Patient Instructions  Pain  Management Discharge Instructions  General Discharge Instructions :  If you need to reach your doctor call: Monday-Friday 8:00 am - 4:00 pm at [phone] or toll free [phone].  After clinic hours [phone] to have operator reach doctor.  Bring all of your medication bottles to all your appointments in the pain clinic.  To cancel or reschedule your appointment with Pain Management please remember to call 24 hours in advance to avoid a fee.  Refer to the educational materials which you have been given on: General Risks, I had my Procedure. Discharge Instructions, Post  Sedation.  Post Procedure Instructions:  The drugs you were given will stay in your system until tomorrow, so for the next 24 hours you should not drive, make any legal decisions or drink any alcoholic beverages.  You may eat anything you prefer, but it is better to start with liquids then soups and crackers, and gradually work up to solid foods.  Please notify your doctor immediately if you have any unusual bleeding, trouble breathing or pain that is not related to your normal pain.  Depending on the type of procedure that was done, some parts of your body may feel week and/or numb.  This usually clears up by tonight or the next day.  Walk with the use of an assistive device or accompanied by an adult for the 24 hours.  You may use ice on the affected area for the first 24 hours.  Put ice in a Ziploc bag and cover with a towel and place against area 15 minutes on 15 minutes off.  You may switch to heat after 24 hours.

## 2015-11-23 NOTE — Progress Notes (Signed)
Safety precautions to be maintained throughout the outpatient stay will include: orient to surroundings, keep bed in low position, maintain call bell within reach at all times, provide assistance with transfer out of bed and ambulation.  

## 2015-11-26 LAB — 25-HYDROXYVITAMIN D LCMS D2+D3: 25-HYDROXY, VITAMIN D-3: 28 ng/mL

## 2015-11-26 LAB — 25-HYDROXY VITAMIN D LCMS D2+D3
25-Hydroxy, Vitamin D-2: <1 ng/mL
25-Hydroxy, Vitamin D: 28 ng/mL — ABNORMAL LOW

## 2015-12-01 LAB — COMPLIANCE DRUG ANALYSIS, UR

## 2015-12-04 DIAGNOSIS — M19012 Primary osteoarthritis, left shoulder: Secondary | ICD-10-CM

## 2015-12-04 DIAGNOSIS — M19011 Primary osteoarthritis, right shoulder: Secondary | ICD-10-CM

## 2015-12-19 ENCOUNTER — Other Ambulatory Visit: Payer: Self-pay | Admitting: Pain Medicine

## 2015-12-19 ENCOUNTER — Encounter: Payer: Self-pay | Admitting: Pain Medicine

## 2015-12-19 DIAGNOSIS — E559 Vitamin D deficiency, unspecified: Secondary | ICD-10-CM | POA: Insufficient documentation

## 2015-12-19 MED ORDER — VITAMIN D3 50 MCG (2000 UT) PO CAPS: ORAL_CAPSULE | ORAL | 99 refills | Status: DC

## 2015-12-19 MED ORDER — VITAMIN D (ERGOCALCIFEROL) 1.25 MG (50000 UNIT) PO CAPS: ORAL_CAPSULE | ORAL | 0 refills | Status: DC

## 2015-12-19 NOTE — Progress Notes (Signed)
-   Low levels of ALP may be seen temporarily after blood transfusions or heart bypass surgery. A deficiency in zinc may cause decreased levels. A rare genetic disorder of bone metabolism called hypophosphatasia can cause severe, protracted low levels of ALP. Malnutrition or protein deficiency as well as Wilson disease could also be possible causes for lowered ALP.

## 2015-12-19 NOTE — Progress Notes (Signed)

## 2015-12-21 ENCOUNTER — Encounter: Payer: Self-pay | Admitting: Pain Medicine

## 2015-12-21 ENCOUNTER — Ambulatory Visit: Payer: Medicaid Other | Attending: Pain Medicine | Admitting: Pain Medicine

## 2015-12-21 VITALS — BP 138/86 | HR 71 | Temp 98.0°F | Resp 18 | Ht 63.0 in | Wt 150.0 lb

## 2015-12-21 DIAGNOSIS — M159 Polyosteoarthritis, unspecified: Secondary | ICD-10-CM

## 2015-12-21 DIAGNOSIS — Z90710 Acquired absence of both cervix and uterus: Secondary | ICD-10-CM | POA: Insufficient documentation

## 2015-12-21 DIAGNOSIS — G8929 Other chronic pain: Secondary | ICD-10-CM

## 2015-12-21 DIAGNOSIS — M549 Dorsalgia, unspecified: Secondary | ICD-10-CM

## 2015-12-21 DIAGNOSIS — M797 Fibromyalgia: Secondary | ICD-10-CM | POA: Diagnosis not present

## 2015-12-21 DIAGNOSIS — M150 Primary generalized (osteo)arthritis: Secondary | ICD-10-CM

## 2015-12-21 DIAGNOSIS — M25512 Pain in left shoulder: Secondary | ICD-10-CM

## 2015-12-21 DIAGNOSIS — M25511 Pain in right shoulder: Secondary | ICD-10-CM | POA: Diagnosis not present

## 2015-12-21 DIAGNOSIS — G894 Chronic pain syndrome: Secondary | ICD-10-CM | POA: Diagnosis not present

## 2015-12-21 DIAGNOSIS — Z79891 Long term (current) use of opiate analgesic: Secondary | ICD-10-CM | POA: Diagnosis not present

## 2015-12-21 DIAGNOSIS — M5412 Radiculopathy, cervical region: Secondary | ICD-10-CM | POA: Insufficient documentation

## 2015-12-21 DIAGNOSIS — Z5189 Encounter for other specified aftercare: Secondary | ICD-10-CM | POA: Insufficient documentation

## 2015-12-21 DIAGNOSIS — M1991 Primary osteoarthritis, unspecified site: Secondary | ICD-10-CM | POA: Insufficient documentation

## 2015-12-21 DIAGNOSIS — F17210 Nicotine dependence, cigarettes, uncomplicated: Secondary | ICD-10-CM | POA: Insufficient documentation

## 2015-12-21 DIAGNOSIS — F1190 Opioid use, unspecified, uncomplicated: Secondary | ICD-10-CM

## 2015-12-21 DIAGNOSIS — M542 Cervicalgia: Secondary | ICD-10-CM

## 2015-12-21 MED ORDER — HYDROCODONE-ACETAMINOPHEN 5-325 MG PO TABS: 1.0000 | ORAL_TABLET | Freq: Two times a day (BID) | ORAL | 0 refills | Status: DC

## 2015-12-21 MED ORDER — GABAPENTIN 300 MG PO CAPS: 300.0000 mg | ORAL_CAPSULE | Freq: Three times a day (TID) | ORAL | 2 refills | Status: DC

## 2015-12-21 MED ORDER — MELOXICAM 15 MG PO TABS: 15.0000 mg | ORAL_TABLET | Freq: Every day | ORAL | 2 refills | Status: DC

## 2015-12-21 MED ORDER — CYCLOBENZAPRINE HCL 10 MG PO TABS: 10.0000 mg | ORAL_TABLET | Freq: Three times a day (TID) | ORAL | 2 refills | Status: DC | PRN

## 2015-12-21 NOTE — Progress Notes (Signed)
Patient's Name: Tanya [name]  MRN: [id number]  Referring Provider: Luciana Axe, NP  DOB: 1960/02/15  PCP: Ricardo Jericho, NP  DOS: 12/21/2015  Note by: Kathlen Brunswick. Dossie Arbour, MD  Service setting: Ambulatory outpatient  Specialty: Interventional Pain Management  Location: [facility] (AMB) Pain Management Facility    Patient type: Established   Primary Reason(s) for Visit: Encounter for evaluation before starting new chronic pain management plan of care (Level of risk: moderate) CC: Neck Pain (bilateral) and Back Pain (bilateral)  HPI  Tanya [name] is a 55 y.o. year old, female patient, who comes today for a follow-up evaluation to review the test results and decide on a treatment plan. She has Anxiety; Chronic upper back pain (Location of Tertiary source of pain) (Bilateral) (R>L); Chronic discoid lupus erythematosus; Chronic low back pain (Bilateral) (R>L); Chronic neck pain (Location of Secondary source of pain) (Bilateral) (R>L); Discoid lupus; Diverticulitis; Encounter for long-term (current) use of high-risk medication; Fibromyalgia; Generalized anxiety disorder; Hypercholesterolemia; Hyperlipidemia; Major depressive disorder, recurrent ([place]); Pain medication agreement signed; Osteoarthritis; SLE (systemic lupus erythematosus) ([place]); Urinary incontinence; Long term prescription benzodiazepine use; Long term current use of opiate analgesic; Long term prescription opiate use; Opiate use (80 MME/Day); Chronic pain syndrome; Chronic upper extremity pain (Right); Chronic cervical radicular pain (Right); Chronic shoulder pain (Location of Primary Source of Pain) (Bilateral) (R>L); 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 Her primarily concern today is the Neck Pain (bilateral) and Back Pain (bilateral)  Pain Assessment: Self-Reported Pain Score: 4 /10             Reported level is compatible with observation.       Pain Type: Chronic  pain Pain Location: Neck (back) Pain Descriptors / Indicators: Numbness, Aching, Constant, Burning Pain Frequency: Constant  Tanya [name] comes in today for a follow-up visit after her initial evaluation on 12/19/2015. Today we went over the results of her tests. These were explained in "Layman's terms". During today's appointment we went over my diagnostic impression, as well as the proposed treatment plan.  In considering the treatment plan options, Tanya [name] was reminded that I no longer take patients for medication management only. I asked her to let me know if she had no intention of taking advantage of the interventional therapies, so that we could make arrangements to provide this space to someone interested. I also made it clear that undergoing interventional therapies for the purpose of getting pain medications is very inappropriate on the part of a patient, and it will not be tolerated in this practice. This type of behavior would suggest true addiction and therefore it requires referral to an addiction specialist.   Further details on both, my assessment(s), as well as the proposed treatment plan, please see below. Controlled Substance Pharmacotherapy Assessment REMS (Risk Evaluation and Mitigation Strategy)  Analgesic: Hydrocodone/APAP 5/325 MME/day: 0 mg/day Pill Count: None expected due to no prior prescriptions written by our practice. Pharmacokinetics: Liberation and absorption (onset of action): WNL Distribution (time to peak effect): WNL Metabolism and excretion (duration of action): WNL         Pharmacodynamics: Desired effects: Analgesia: Tanya [name] reports >50% benefit. Functional ability: Patient reports that medication allows her to accomplish basic ADLs Clinically meaningful improvement in function (CMIF): Sustained CMIF goals met Perceived effectiveness: Described as relatively effective, allowing for increase in activities of daily living  (ADL) Undesirable effects: Side-effects or Adverse reactions: None reported Monitoring: [place] PMP: Online review of  the past 8-monthperiod previously conducted. Not applicable at this point since we have not taken over the patient's medication management yet. List of all UDS test(s) done:  Lab Results  Component Value Date   SUMMARY FINAL 11/23/2015   Last UDS on record: Summary  Date Value Ref Range Status  11/23/2015 FINAL  Final    Comment:    ==================================================================== TOXAS[facility]RE COMP DRUG ANALYSIS,UR ==================================================================== Test                             Result       Flag       Units Drug Present and Declared for Prescription Verification   Gabapentin                     PRESENT      EXPECTED Drug Absent but Declared for Prescription Verification   Cyclobenzaprine                Not Detected UNEXPECTED   Duloxetine                     Not Detected UNEXPECTED ==================================================================== Test                      Result    Flag   Units      Ref Range   Creatinine              22               mg/dL      >=20 ==================================================================== Declared Medications:  The flagging and interpretation on this report are based on the  following declared medications.  Unexpected results may arise from  inaccuracies in the declared medications.  **Note: The testing scope of this panel includes these medications:  Cyclobenzaprine (Flexeril)  Duloxetine (Cymbalta)  Gabapentin (Neurontin)  **Note: The testing scope of this panel does not include following  reported medications:  Atorvastatin (Lipitor)  Docusate (Colace)  Meloxicam (Mobic) ==================================================================== For clinical consultation, please call [phone]  [phone] ====================================================================    UDS interpretation: No unexpected findings.          Medication Assessment Form: Patient introduced to form today Treatment compliance: Treatment may start today if patient agrees with proposed plan. Evaluation of compliance is not applicable at this point Risk Assessment Profile: Aberrant behavior: See initial evaluations. None observed or detected today Comorbid factors increasing risk of overdose: See initial evaluation. No additional risks detected today Risk Mitigation Strategies:  Patient opioid safety counseling: Completed today. Counseling provided to patient as per "Patient Counseling Document". Document signed by patient, attesting to counseling and understanding Patient-Prescriber Agreement (PPA): Obtained today  Controlled substance notification to other providers: Written and sent today  Pharmacologic Plan: Today we may be taking over the patient's pharmacological regimen. See below  Laboratory Chemistry  Inflammation Markers Lab Results  Component Value Date   ESRSEDRATE 8 11/23/2015   CRP <0.8 11/23/2015   Renal Function Lab Results  Component Value Date   BUN 10 11/23/2015   CREATININE 0.67 11/23/2015   GFRAA >60 11/23/2015   GFRNONAA >60 11/23/2015   Hepatic Function Lab Results  Component Value Date   AST 23 11/23/2015   ALT 15 11/23/2015   ALBUMIN 4.6 11/23/2015   Electrolytes Lab Results  Component Value Date   NA 138 11/23/2015   K 4.0 11/23/2015   CL 102 11/23/2015  CALCIUM 9.5 11/23/2015   MG 2.1 11/23/2015   Pain Modulating Vitamins Lab Results  Component Value Date   25OHVITD1 28 (L) 11/23/2015   25OHVITD2 <1.0 11/23/2015   25OHVITD3 28 11/23/2015   VITAMINB12 318 11/23/2015   Coagulation Parameters No results found for: INR, LABPROT, APTT, PLT Cardiovascular No results found for: BNP, HGB, HCT Note: Lab results reviewed.  Recent Diagnostic Imaging  Review  Dg Shoulder Right Result Date: 11/23/2015 CLINICAL DATA:  Gradual worsening of bilateral shoulder pain. History of lupus. No history of previous shoulder injury nor of surgery. EXAM: LEFT SHOULDER - 2+ VIEW; RIGHT SHOULDER - 2+ VIEW COMPARISON:  None in PACs FINDINGS: Right shoulder: The bones are subjectively osteopenic. The glenohumeral and AC joint spaces are reasonably well maintained. The articular surfaces of the humeral head and adjacent glenoid appear normal. The subacromial subdeltoid space is normal. The observed portions of the right clavicle and upper right ribs are normal. Left shoulder: The bones are subjectively osteopenic similar to that on the right. The glenohumeral and AC joint spaces are preserved. The articular surfaces of the humeral head and acetabulum remains smoothly rounded. The subacromial subdeltoid space is normal. The observed portions of the left clavicle and upper left ribs are normal. IMPRESSION: There is no acute or significant chronic bony abnormality of either shoulder. Electronically Signed   By: David  Martinique M.D.   On: 11/23/2015 14:26   Dg Shoulder Left Result Date: 11/23/2015 CLINICAL DATA:  Gradual worsening of bilateral shoulder pain. History of lupus. No history of previous shoulder injury nor of surgery. EXAM: LEFT SHOULDER - 2+ VIEW; RIGHT SHOULDER - 2+ VIEW COMPARISON:  None in PACs FINDINGS: Right shoulder: The bones are subjectively osteopenic. The glenohumeral and AC joint spaces are reasonably well maintained. The articular surfaces of the humeral head and adjacent glenoid appear normal. The subacromial subdeltoid space is normal. The observed portions of the right clavicle and upper right ribs are normal. Left shoulder: The bones are subjectively osteopenic similar to that on the right. The glenohumeral and AC joint spaces are preserved. The articular surfaces of the humeral head and acetabulum remains smoothly rounded. The subacromial subdeltoid space  is normal. The observed portions of the left clavicle and upper left ribs are normal. IMPRESSION: There is no acute or significant chronic bony abnormality of either shoulder. Electronically Signed   By: David  Martinique M.D.   On: 11/23/2015 14:26   Cervical Imaging: Cervical MR wo contrast:  Results for orders placed during the hospital encounter of 09/20/15  MR Cervical Spine Wo Contrast   Narrative CLINICAL DATA:  Long history of neck and back pain.  EXAM: MRI CERVICAL, THORACIC  WITHOUT CONTRAST  TECHNIQUE: Multiplanar and multiecho pulse sequences of the cervical spine, to include the craniocervical junction and cervicothoracic junction and thoracic spine were obtained without intravenous contrast.  COMPARISON:  None.  FINDINGS: MRI CERVICAL SPINE FINDINGS  Examination is limited due to patient motion and poor image quality.  Alignment: Normal.  Vertebrae: Grossly normal.  Cord: Grossly normal.  Posterior Fossa, vertebral arteries, paraspinal tissues: No significant findings.  Disc levels:  C2-3:  No significant findings.  C3-4: Shallow central disc protrusion with mild mass effect on the ventral thecal sac. Mild narrowing of the ventral CSF space. Mild right foraminal narrowing due to uncinate spurring.  C4-5: Bulging annulus, osteophytic ridging and uncinate spurring with mass effect on the ventral thecal sac, asymmetric right. Narrowing of the ventral CSF space. Moderate bilateral foraminal stenosis.  C5-6: Shallow central disc protrusion, osteophytic ridging and uncinate spurring with flattening of the ventral thecal sac and narrowing of the ventral CSF space. Moderate left and mild right foraminal stenosis.  C6-7: Broad-based disc protrusion asymmetrical left with mass effect on the thecal sac and narrowing of the ventral CSF space. No significant foraminal stenosis.  C7-T1: No significant findings.  MRI THORACIC SPINE FINDINGS  Alignment:   Normal  Vertebrae: Normal  Cord:  Normal  Paraspinal and other soft tissues: Unremarkable  Disc levels:  No significant thoracic disc protrusions, spinal or foraminal stenosis. Shallow central disc protrusion noted at T7-8 with minimal impression on the ventral thecal sac.  IMPRESSION: 1. 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 2. Normal thoracic spine examination except for mild disc disease and facet disease and a shallow central disc protrusion at T7-8.   Electronically Signed   By: Marijo Sanes M.D.   On: 09/20/2015 11:04    Shoulder Imaging: Shoulder-R DG:  Results for orders placed during the hospital encounter of 11/23/15  DG Shoulder Right   Narrative CLINICAL DATA:  Gradual worsening of bilateral shoulder pain. History of lupus. No history of previous shoulder injury nor of surgery.  EXAM: LEFT SHOULDER - 2+ VIEW; RIGHT SHOULDER - 2+ VIEW  COMPARISON:  None in PACs  FINDINGS: Right shoulder: The bones are subjectively osteopenic. The glenohumeral and AC joint spaces are reasonably well maintained. The articular surfaces of the humeral head and adjacent glenoid appear normal. The subacromial subdeltoid space is normal. The observed portions of the right clavicle and upper right ribs are normal.  Left shoulder: The bones are subjectively osteopenic similar to that on the right. The glenohumeral and AC joint spaces are preserved. The articular surfaces of the humeral head and acetabulum remains smoothly rounded. The subacromial subdeltoid space is normal. The observed portions of the left clavicle and upper left ribs are normal.  IMPRESSION: There is no acute or significant chronic bony abnormality of either shoulder.   Electronically Signed   By: David   Martinique M.D.   On: 11/23/2015 14:26    Shoulder-L DG:  Results for orders placed during the hospital encounter of 11/23/15  DG Shoulder Left   Narrative CLINICAL DATA:  Gradual worsening of bilateral shoulder pain. History of lupus. No history of previous shoulder injury nor of surgery.  EXAM: LEFT SHOULDER - 2+ VIEW; RIGHT SHOULDER - 2+ VIEW  COMPARISON:  None in PACs  FINDINGS: Right shoulder: The bones are subjectively osteopenic. The glenohumeral and AC joint spaces are reasonably well maintained. The articular surfaces of the humeral head and adjacent glenoid appear normal. The subacromial subdeltoid space is normal. The observed portions of the right clavicle and upper right ribs are normal.  Left shoulder: The bones are subjectively osteopenic similar to that on the right. The glenohumeral and AC joint spaces are preserved. The articular surfaces of the humeral head and acetabulum remains smoothly rounded. The subacromial subdeltoid space is normal. The observed portions of the left clavicle and upper left ribs are normal.  IMPRESSION: There is no acute or significant chronic bony abnormality of either shoulder.   Electronically Signed   By: David  Martinique M.D.   On: 11/23/2015 14:26    Thoracic Imaging: Thoracic MR wo contrast:  Results for orders placed during the hospital encounter of 09/20/15  MR  Thoracic Spine Wo Contrast   Narrative CLINICAL DATA:  Long history of neck and back pain.  EXAM: MRI CERVICAL, THORACIC  WITHOUT CONTRAST  TECHNIQUE: Multiplanar and multiecho pulse sequences of the cervical spine, to include the craniocervical junction and cervicothoracic junction and thoracic spine were obtained without intravenous contrast.  COMPARISON:  None.  FINDINGS: MRI CERVICAL SPINE FINDINGS  Examination is limited due to patient motion and poor image quality.  Alignment: Normal.  Vertebrae: Grossly normal.  Cord: Grossly  normal.  Posterior Fossa, vertebral arteries, paraspinal tissues: No significant findings.  Disc levels:  C2-3:  No significant findings.  C3-4: Shallow central disc protrusion with mild mass effect on the ventral thecal sac. Mild narrowing of the ventral CSF space. Mild right foraminal narrowing due to uncinate spurring.  C4-5: Bulging annulus, osteophytic ridging and uncinate spurring with mass effect on the ventral thecal sac, asymmetric right. Narrowing of the ventral CSF space. Moderate bilateral foraminal stenosis.  C5-6: Shallow central disc protrusion, osteophytic ridging and uncinate spurring with flattening of the ventral thecal sac and narrowing of the ventral CSF space. Moderate left and mild right foraminal stenosis.  C6-7: Broad-based disc protrusion asymmetrical left with mass effect on the thecal sac and narrowing of the ventral CSF space. No significant foraminal stenosis.  C7-T1: No significant findings.  MRI THORACIC SPINE FINDINGS  Alignment:  Normal  Vertebrae: Normal  Cord:  Normal  Paraspinal and other soft tissues: Unremarkable  Disc levels:  No significant thoracic disc protrusions, spinal or foraminal stenosis. Shallow central disc protrusion noted at T7-8 with minimal impression on the ventral thecal sac.  IMPRESSION: 1. 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 2. Normal thoracic spine examination except for mild disc disease and facet disease and a shallow central disc protrusion at T7-8.   Electronically Signed   By: Marijo Sanes M.D.   On: 09/20/2015 11:04    Note: Imaging results reviewed.  Meds  The patient has a current medication list which includes the following prescription(s): vitamin d3, cyclobenzaprine, duloxetine, gabapentin,  hydrocodone-acetaminophen, ibuprofen, meloxicam, mometasone, pimecrolimus, and vitamin d (ergocalciferol).  Current Outpatient Prescriptions on File Prior to Visit  Medication Sig  . Cholecalciferol (VITAMIN D3) 2000 units capsule Take 1 capsule (2,000 Units total) by mouth daily.  . DULoxetine (CYMBALTA) 30 MG capsule TAKE 1 CAPSULE(30 MG) BY MOUTH EVERY DAY  . Vitamin D, Ergocalciferol, (DRISDOL) 50000 units CAPS capsule Take 1 capsule (50,000 Units total) by mouth 2 (two) times a week. X 6 weeks.   No current facility-administered medications on file prior to visit.    ROS  Constitutional: Denies any fever or chills Gastrointestinal: No reported hemesis, hematochezia, vomiting, or acute GI distress Musculoskeletal: Denies any acute onset joint swelling, redness, loss of ROM, or weakness Neurological: No reported episodes of acute onset apraxia, aphasia, dysarthria, agnosia, amnesia, paralysis, loss of coordination, or loss of consciousness  Allergies  Tanya [name] is allergic to cyclobenzaprine; methocarbamol; trazodone and nefazodone; bupropion; oxycodone-acetaminophen; and tramadol.  [employer]  Drug: Tanya [name]  reports that she does not use drugs. Alcohol:  reports that she does not drink alcohol. Tobacco:  reports that she has been smoking Cigarettes.  She has been smoking about 0.50 packs per day. She has never used smokeless tobacco. Medical:  has a past medical history of Anxiety; Depression; Hyperlipidemia; and Lupus. Family:  family history includes Cancer in her mother; Diabetes in her brother; Emphysema in her mother; Glaucoma in her father; Heart disease in her father; Hypertension in her mother; Stroke in her mother.  Past Surgical History:  Procedure Laterality Date  . ABDOMINAL HYSTERECTOMY     Constitutional Exam  General appearance: Well nourished, well developed, and well hydrated. In no apparent acute distress Vitals:   12/21/15 1352  BP: 138/86  Pulse: 71   Resp: 18  Temp: 98 F (36.7 C)  SpO2: 100%  Weight: 150 lb (68 kg)  Height: 5' 3"  (1.6 m)   BMI Assessment: Estimated body mass index is 26.57 kg/m as calculated from the following:   Height as of this encounter: 5' 3"  (1.6 m).   Weight as of this encounter: 150 lb (68 kg).  BMI interpretation table: BMI level Category Range association with higher incidence of chronic pain  <18 kg/m2 Underweight   18.5-24.9 kg/m2 Ideal body weight   25-29.9 kg/m2 Overweight Increased incidence by 20%  30-34.9 kg/m2 Obese (Class I) Increased incidence by 68%  35-39.9 kg/m2 Severe obesity (Class II) Increased incidence by 136%  >40 kg/m2 Extreme obesity (Class III) Increased incidence by 254%   BMI Readings from Last 4 Encounters:  12/21/15 26.57 kg/m  11/23/15 26.52 kg/m  10/09/15 25.69 kg/m   Wt Readings from Last 4 Encounters:  12/21/15 150 lb (68 kg)  11/23/15 145 lb (65.8 kg)  10/09/15 145 lb (65.8 kg)  Psych/Mental status: Alert, oriented x 3 (person, place, & time) Eyes: PERLA Respiratory: No evidence of acute respiratory distress  Cervical Spine Exam  Inspection: No masses, redness, or swelling Alignment: Symmetrical Functional ROM: Unrestricted ROM Stability: No instability detected Muscle strength & Tone: Functionally intact Sensory: Unimpaired Palpation: Non-contributory  Upper Extremity (UE) Exam    Side: Right upper extremity  Side: Left upper extremity  Inspection: No masses, redness, swelling, or asymmetry  Inspection: No masses, redness, swelling, or asymmetry  Functional ROM: Unrestricted ROM          Functional ROM: Unrestricted ROM          Muscle strength & Tone: Functionally intact  Muscle strength & Tone: Functionally intact  Sensory: Unimpaired  Sensory: Unimpaired  Palpation: Non-contributory  Palpation: Non-contributory   Thoracic Spine Exam  Inspection: No masses, redness, or swelling Alignment: Symmetrical Functional ROM: Unrestricted  ROM Stability: No instability detected Sensory: Unimpaired Muscle strength & Tone: Functionally intact Palpation: Non-contributory  Lumbar Spine Exam  Inspection: No masses, redness, or swelling Alignment: Symmetrical Functional ROM: Unrestricted ROM Stability: No instability detected Muscle strength & Tone: Functionally intact Sensory: Unimpaired Palpation: Non-contributory Provocative Tests: Lumbar Hyperextension and rotation test: evaluation deferred today       Patrick's Maneuver: evaluation deferred today              Gait & Posture Assessment  Ambulation: Unassisted Gait: Relatively normal for age and body habitus Posture: WNL   Lower Extremity Exam    Side: Right lower extremity  Side: Left lower extremity  Inspection: No masses, redness, swelling, or asymmetry  Inspection: No masses, redness, swelling, or asymmetry  Functional ROM: Unrestricted ROM          Functional ROM: Unrestricted ROM          Muscle strength & Tone: Functionally intact  Muscle strength & Tone: Functionally intact  Sensory: Unimpaired  Sensory: Unimpaired  Palpation: Non-contributory  Palpation: Non-contributory   Assessment & Plan  Primary Diagnosis & Pertinent Problem List:  The primary encounter diagnosis was Chronic pain syndrome. Diagnoses of Long term prescription opiate use, Opiate use (80 MME/Day), Chronic shoulder pain (Location of Primary Source of Pain) (Bilateral) (R>L), Chronic neck pain (Location of Secondary source of pain) (Bilateral) (R>L), Chronic upper back pain (Location of Tertiary source of pain) (Bilateral) (R>L), Primary osteoarthritis involving multiple joints, and Fibromyalgia were also pertinent to this visit.  Visit Diagnosis: 1. Chronic pain syndrome   2. Long term prescription opiate use   3. Opiate use (80 MME/Day)   4. Chronic shoulder pain (Location of Primary Source of Pain) (Bilateral) (R>L)   5. Chronic neck pain (Location of Secondary source of pain) (Bilateral)  (R>L)   6. Chronic upper back pain (Location of Tertiary source of pain) (Bilateral) (R>L)   7. Primary osteoarthritis involving multiple joints   8. Fibromyalgia    Problems updated and reviewed during this visit: No problems updated. Problem-specific Plan(s): No problem-specific Assessment & Plan notes found for this encounter.  No new Assessment & Plan notes have been filed under this hospital service since the last note was generated. Service: Pain Management  Plan of Care  Pharmacotherapy (Medications Ordered): Meds ordered this encounter  Medications  . meloxicam (MOBIC) 15 MG tablet    Sig: Take 1 tablet (15 mg total) by mouth daily.    Dispense:  30 tablet    Refill:  2    Do not add this medication to the electronic "Automatic Refill" notification system. Patient may have prescription filled one day early if pharmacy is closed on scheduled refill date.  . cyclobenzaprine (FLEXERIL) 10 MG tablet    Sig: Take 1 tablet (10 mg total) by mouth 3 (three) times daily as needed for muscle spasms.    Dispense:  90 tablet    Refill:  2    Do not add this medication to the electronic "Automatic Refill" notification system. Patient may have prescription filled one day early if pharmacy is closed on scheduled refill date.  . gabapentin (NEURONTIN) 300 MG capsule    Sig: Take 1 capsule (300 mg total) by mouth 3 (three) times daily.    Dispense:  90 capsule    Refill:  2    Do not add this medication to the electronic "Automatic Refill" notification system. Patient may have prescription filled one day early if pharmacy is closed on scheduled refill date.  Marland Kitchen HYDROcodone-acetaminophen (NORCO/VICODIN) 5-325 MG tablet    Sig: Take 1 tablet by mouth 2 (two) times daily.    Dispense:  60 tablet    Refill:  0    Do not place this medication, or any other prescription from our practice, on "Automatic Refill". Patient may have prescription filled one day early if pharmacy is closed on scheduled  refill date. Do not fill until: 12/21/15 To last until:01/20/16   Lab-work, procedure(s), and/or referral(s): Orders Placed This Encounter  Procedures  . Cervical Epidural Injection    Pharmacotherapy: Opioid Analgesics: We'll take over management today. See above orders Membrane stabilizer: We have discussed the possibility of optimizing this mode of therapy, if tolerated Muscle relaxant: We have discussed the possibility of a trial NSAID: We have discussed the possibility of a trial Other analgesic(s): To be determined at a later time   Interventional therapies: Planned, scheduled, and/or pending:    Diagnostic right-sided cervical epidural steroid injection under fluoroscopic guidance and IV sedation.    Considering:   Diagnostic bilateral intra-articular shoulder joint injection  Diagnostic bilateral suprascapular nerve block  Possible bilateral suprascapular nerve RFA  Right cervical epidural steroid injection  Diagnostic bilateral cervical facet block possible bilateral cervical facet radiofrequency ablation  Diagnostic bilateral lumbar facet block  Possible bilateral lumbar facet radiofrequency ablation    PRN Procedures:   To be determined at a later time   Provider-requested follow-up: Return in about 2 months (around 02/20/2016) for in addition, procedure (ASAP).  Future Appointments Date Time Provider [place]  12/27/2015 9:00 AM Milinda Pointer, MD [facility] None  02/06/2016 1:45 PM Milinda Pointer, MD [facility] None    Primary Care Physician: Ricardo Jericho, NP Location: [facility] Note by: Kathlen Brunswick. Dossie Arbour, M.D, DABA, DABAPM, DABPM, DABIPP, FIPP  Pain Score Disclaimer: We use the NRS-11 scale. This is a self-reported, subjective measurement of pain severity with only modest accuracy. It is used primarily to identify changes within a particular patient. It must be understood that outpatient pain scales are  significantly less accurate that those used for research, where they can be applied under ideal controlled circumstances with minimal exposure to variables. In reality, the score is likely to be a combination of pain intensity and pain affect, where pain affect describes the degree of emotional arousal or changes in action readiness caused by the sensory experience of pain. Factors such as social and work situation, setting, emotional state, anxiety levels, expectation, and prior pain experience may influence pain perception and show large inter-individual differences that may also be affected by time variables.  Patient instructions provided during this appointment: Patient Instructions   Steps to Quit Smoking Smoking tobacco can be bad for your health. It can also affect almost every organ in your body. Smoking puts you and people around you at risk for many serious long-lasting (chronic) diseases. Quitting smoking is hard, but it is one of the best things that you can do for your health. It is never too late to quit. What are the benefits of quitting smoking? When you quit smoking, you lower your risk for getting serious diseases and conditions. They can include:  Lung cancer or lung disease.  Heart disease.  Stroke.  Heart attack.  Not being able to have children (infertility).  Weak bones (osteoporosis) and broken bones (fractures). If you have coughing, wheezing, and shortness of breath, those symptoms may get better when you quit. You may also get sick less often. If you are pregnant, quitting smoking can help to lower your chances of having a baby of low birth weight. What can I do to help me quit smoking? Talk with your doctor about what can help you quit smoking. Some things you can do (strategies) include:  Quitting smoking totally, instead of slowly cutting back how much you smoke over a period of time.  Going to in-person counseling. You are more likely to quit if you go to  many counseling sessions.  Using resources and support systems, such as:  Online chats with a Social worker.  Phone quitlines.  Printed Furniture conservator/restorer.  Support groups or group counseling.  Text messaging programs.  Mobile phone apps or applications.  Taking medicines. Some of these medicines may have nicotine in them. If you are pregnant or breastfeeding, do not take any medicines to quit smoking unless your doctor says it is okay. Talk with your doctor about counseling or other things that can help you. Talk with your doctor about using more than one strategy at the same time, such as taking medicines while you are also going to in-person counseling.  This can help make quitting easier. What things can I do to make it easier to quit? Quitting smoking might feel very hard at first, but there is a lot that you can do to make it easier. Take these steps:  Talk to your family and friends. Ask them to support and encourage you.  Call phone quitlines, reach out to support groups, or work with a Social worker.  Ask people who smoke to not smoke around you.  Avoid places that make you want (trigger) to smoke, such as:  Bars.  Parties.  Smoke-break areas at work.  Spend time with people who do not smoke.  Lower the stress in your life. Stress can make you want to smoke. Try these things to help your stress:  Getting regular exercise.  Deep-breathing exercises.  Yoga.  Meditating.  Doing a body scan. To do this, close your eyes, focus on one area of your body at a time from head to toe, and notice which parts of your body are tense. Try to relax the muscles in those areas.  Download or buy apps on your mobile phone or tablet that can help you stick to your quit plan. There are many free apps, such as QuitGuide from the [employer] Office manager for Disease Control and Prevention). You can find more support from smokefree.gov and other websites. This information is not intended to replace  advice given to you by your health care provider. Make sure you discuss any questions you have with your health care provider. Document Released: 11/03/2008 Document Revised: 09/05/2015 Document Reviewed: 05/24/2014 Elsevier Interactive Patient Education  2017 [place]  What are the risk, side effects and possible complications? Generally speaking, most procedures are safe.  However, with any procedure there are risks, side effects, and the possibility of complications.  The risks and complications are dependent upon the sites that are lesioned, or the type of nerve block to be performed.  The closer the procedure is to the spine, the more serious the risks are.  Great care is taken when placing the radio frequency needles, block needles or lesioning probes, but sometimes complications can occur. 1. Infection: Any time there is an injection through the skin, there is a risk of infection.  This is why sterile conditions are used for these blocks.  There are four possible types of infection. 1. Localized skin infection. 2. Central Nervous System Infection-This can be in the form of Meningitis, which can be deadly. 3. Epidural Infections-This can be in the form of an epidural abscess, which can cause pressure inside of the spine, causing compression of the spinal cord with subsequent paralysis. This would require an emergency surgery to decompress, and there are no guarantees that the patient would recover from the paralysis. 4. Discitis-This is an infection of the intervertebral discs.  It occurs in about 1% of discography procedures.  It is difficult to treat and it may lead to surgery.        2. Pain: the needles have to go through skin and soft tissues, will cause soreness.       3. Damage to internal structures:  The nerves to be lesioned may be near blood vessels or    other nerves which can be potentially damaged.       4. Bleeding: Bleeding is more common if  the patient is taking blood thinners such as  aspirin, Coumadin, Ticiid, Plavix, etc., or if he/she have some genetic predisposition  such as hemophilia.  Bleeding into the spinal canal can cause compression of the spinal  cord with subsequent paralysis.  This would require an emergency surgery to  decompress and there are no guarantees that the patient would recover from the  paralysis.       5. Pneumothorax:  Puncturing of a lung is a possibility, every time a needle is introduced in  the area of the chest or upper back.  Pneumothorax refers to free air around the  collapsed lung(s), inside of the thoracic cavity (chest cavity).  Another two possible  complications related to a similar event would include: Hemothorax and Chylothorax.   These are variations of the Pneumothorax, where instead of air around the collapsed  lung(s), you may have blood or chyle, respectively.       6. Spinal headaches: They may occur with any procedures in the area of the spine.       7. Persistent CSF (Cerebro-Spinal Fluid) leakage: This is a rare problem, but may occur  with prolonged intrathecal or epidural catheters either due to the formation of a fistulous  track or a dural tear.       8. Nerve damage: By working so close to the spinal cord, there is always a possibility of  nerve damage, which could be as serious as a permanent spinal cord injury with  paralysis.       9. Death:  Although rare, severe deadly allergic reactions known as "Anaphylactic  reaction" can occur to any of the medications used.      10. Worsening of the symptoms:  We can always make thing worse.  What are the chances of something like this happening? Chances of any of this occuring are extremely low.  By statistics, you have more of a chance of getting killed in a motor vehicle accident: while driving to the hospital than any of the above occurring .  Nevertheless, you should be aware that they are possibilities.  In general, it is similar to  taking a shower.  Everybody knows that you can slip, hit your head and get killed.  Does that mean that you should not shower again?  Nevertheless always keep in mind that statistics do not mean anything if you happen to be on the wrong side of them.  Even if a procedure has a 1 (one) in a 1,000,000 (million) chance of going wrong, it you happen to be that one..Also, keep in mind that by statistics, you have more of a chance of having something go wrong when taking medications.  Who should not have this procedure? If you are on a blood thinning medication (e.g. Coumadin, Plavix, see list of "Blood Thinners"), or if you have an active infection going on, you should not have the procedure.  If you are taking any blood thinners, please inform your physician.  How should I prepare for this procedure?  Do not eat or drink anything at least six hours prior to the procedure.  Bring a driver with you .  It cannot be a taxi.  Come accompanied by an adult that can drive you back, and that is strong enough to help you if your legs get weak or numb from the local anesthetic.  Take all of your medicines the morning of the procedure with just enough water to swallow them.  If you have diabetes, make sure that you are scheduled to have your procedure done first thing in the morning, whenever possible.  If you have diabetes, take only  half of your insulin dose and notify our nurse that you have done so as soon as you arrive at the clinic.  If you are diabetic, but only take blood sugar pills (oral hypoglycemic), then do not take them on the morning of your procedure.  You may take them after you have had the procedure.  Do not take aspirin or any aspirin-containing medications, at least eleven (11) days prior to the procedure.  They may prolong bleeding.  Wear loose fitting clothing that may be easy to take off and that you would not mind if it got stained with Betadine or blood.  Do not wear any jewelry or  perfume  Remove any nail coloring.  It will interfere with some of our monitoring equipment.  NOTE: Remember that this is not meant to be interpreted as a complete list of all possible complications.  Unforeseen problems may occur.  BLOOD THINNERS The following drugs contain aspirin or other products, which can cause increased bleeding during surgery and should not be taken for 2 weeks prior to and 1 week after surgery.  If you should need take something for relief of minor pain, you may take acetaminophen which is found in Tylenol,m Datril, Anacin-3 and Panadol. It is not blood thinner. The products listed below are.  Do not take any of the products listed below in addition to any listed on your instruction sheet.  A.P.C or A.P.C with Codeine Codeine Phosphate Capsules #3 Ibuprofen Ridaura  ABC compound Congesprin Imuran rimadil  Advil Cope Indocin Robaxisal  Alka-Seltzer Effervescent Pain Reliever and Antacid Coricidin or Coricidin-D  Indomethacin Rufen  Alka-Seltzer plus Cold Medicine Cosprin Ketoprofen S-A-C Tablets  Anacin Analgesic Tablets or Capsules Coumadin Korlgesic Salflex  Anacin Extra Strength Analgesic tablets or capsules CP-2 Tablets Lanoril Salicylate  Anaprox Cuprimine Capsules Levenox Salocol  Anexsia-D Dalteparin Magan Salsalate  Anodynos Darvon compound Magnesium Salicylate Sine-off  Ansaid Dasin Capsules Magsal Sodium Salicylate  Anturane Depen Capsules Marnal Soma  APF Arthritis pain formula Dewitt's Pills Measurin Stanback  Argesic Dia-Gesic Meclofenamic Sulfinpyrazone  Arthritis Bayer Timed Release Aspirin Diclofenac Meclomen Sulindac  Arthritis pain formula Anacin Dicumarol Medipren Supac  Analgesic (Safety coated) Arthralgen Diffunasal Mefanamic Suprofen  Arthritis Strength Bufferin Dihydrocodeine Mepro Compound Suprol  Arthropan liquid Dopirydamole Methcarbomol with Aspirin Synalgos  ASA tablets/Enseals Disalcid Micrainin Tagament  Ascriptin Doan's Midol  Talwin  Ascriptin A/D Dolene Mobidin Tanderil  Ascriptin Extra Strength Dolobid Moblgesic Ticlid  Ascriptin with Codeine Doloprin or Doloprin with Codeine Momentum Tolectin  Asperbuf Duoprin Mono-gesic Trendar  Aspergum Duradyne Motrin or Motrin IB Triminicin  Aspirin plain, buffered or enteric coated Durasal Myochrisine Trigesic  Aspirin Suppositories Easprin Nalfon Trillsate  Aspirin with Codeine Ecotrin Regular or Extra Strength Naprosyn Uracel  Atromid-S Efficin Naproxen Ursinus  Auranofin Capsules Elmiron Neocylate Vanquish  Axotal Emagrin Norgesic Verin  Azathioprine Empirin or Empirin with Codeine Normiflo Vitamin E  Azolid Emprazil Nuprin Voltaren  Bayer Aspirin plain, buffered or children's or timed BC Tablets or powders Encaprin Orgaran Warfarin Sodium  Buff-a-Comp Enoxaparin Orudis Zorpin  Buff-a-Comp with Codeine Equegesic Os-Cal-Gesic   Buffaprin Excedrin plain, buffered or Extra Strength Oxalid   Bufferin Arthritis Strength Feldene Oxphenbutazone   Bufferin plain or Extra Strength Feldene Capsules Oxycodone with Aspirin   Bufferin with Codeine Fenoprofen Fenoprofen Pabalate or Pabalate-SF   Buffets II Flogesic Panagesic   Buffinol plain or Extra Strength Florinal or Florinal with Codeine Panwarfarin   Buf-Tabs Flurbiprofen Penicillamine   Butalbital Compound Four-way cold tablets Penicillin  Butazolidin Fragmin Pepto-Bismol   Carbenicillin Geminisyn Percodan   Carna Arthritis Reliever Geopen Persantine   Carprofen Gold's salt Persistin   Chloramphenicol Goody's Phenylbutazone   Chloromycetin Haltrain Piroxlcam   Clmetidine heparin Plaquenil   Cllnoril Hyco-pap Ponstel   Clofibrate Hydroxy chloroquine Propoxyphen         Before stopping any of these medications, be sure to consult the physician who ordered them.  Some, such as Coumadin (Warfarin) are ordered to prevent or treat serious conditions such as "deep thrombosis", "pumonary embolisms", and other heart  problems.  The amount of time that you may need off of the medication may also vary with the medication and the reason for which you were taking it.  If you are taking any of these medications, please make sure you notify your pain physician before you undergo any procedures.         Epidural Steroid Injection Patient Information  Description: The epidural space surrounds the nerves as they exit the spinal cord.  In some patients, the nerves can be compressed and inflamed by a bulging disc or a tight spinal canal (spinal stenosis).  By injecting steroids into the epidural space, we can bring irritated nerves into direct contact with a potentially helpful medication.  These steroids act directly on the irritated nerves and can reduce swelling and inflammation which often leads to decreased pain.  Epidural steroids may be injected anywhere along the spine and from the neck to the low back depending upon the location of your pain.   After numbing the skin with local anesthetic (like Novocaine), a small needle is passed into the epidural space slowly.  You may experience a sensation of pressure while this is being done.  The entire block usually last less than 10 minutes.  Conditions which may be treated by epidural steroids:   Low back and leg pain  Neck and arm pain  Spinal stenosis  Post-laminectomy syndrome  Herpes zoster (shingles) pain  Pain from compression fractures  Preparation for the injection:  1. Do not eat any solid food or dairy products within 8 hours of your appointment.  2. You may drink clear liquids up to 3 hours before appointment.  Clear liquids include water, black coffee, juice or soda.  No milk or cream please. 3. You may take your regular medication, including pain medications, with a sip of water before your appointment  Diabetics should hold regular insulin (if taken separately) and take 1/2 normal NPH dos the morning of the procedure.  Carry some sugar  containing items with you to your appointment. 4. A driver must accompany you and be prepared to drive you home after your procedure.  5. Bring all your current medications with your. 6. An IV may be inserted and sedation may be given at the discretion of the physician.   7. A blood pressure cuff, EKG and other monitors will often be applied during the procedure.  Some patients may need to have extra oxygen administered for a short period. 8. You will be asked to provide medical information, including your allergies, prior to the procedure.  We must know immediately if you are taking blood thinners (like Coumadin/Warfarin)  Or if you are allergic to IV iodine contrast (dye). We must know if you could possible be pregnant.  Possible side-effects:  Bleeding from needle site  Infection (rare, may require surgery)  Nerve injury (rare)  Numbness & tingling (temporary)  Difficulty urinating (rare, temporary)  Spinal headache ( a headache  worse with upright posture)  Light -headedness (temporary)  Pain at injection site (several days)  Decreased blood pressure (temporary)  Weakness in arm/leg (temporary)  Pressure sensation in back/neck (temporary)  Call if you experience:  Fever/chills associated with headache or increased back/neck pain.  Headache worsened by an upright position.  New onset weakness or numbness of an extremity below the injection site  Hives or difficulty breathing (go to the emergency room)  Inflammation or drainage at the infection site  Severe back/neck pain  Any new symptoms which are concerning to you  Please note:  Although the local anesthetic injected can often make your back or neck feel good for several hours after the injection, the pain will likely return.  It takes 3-7 days for steroids to work in the epidural space.  You may not notice any pain relief for at least that one week.  If effective, we will often do a series of three injections  spaced 3-6 weeks apart to maximally decrease your pain.  After the initial series, we generally will wait several months before considering a repeat injection of the same type.  If you have any questions, please call [phone] [facility]

## 2015-12-21 NOTE — Progress Notes (Signed)
Safety precautions to be maintained throughout the outpatient stay will include: orient to surroundings, keep bed in low position, maintain call bell within reach at all times, provide assistance with transfer out of bed and ambulation.  

## 2015-12-21 NOTE — Patient Instructions (Addendum)
Steps to Quit Smoking Smoking tobacco can be bad for your health. It can also affect almost every organ in your body. Smoking puts you and people around you at risk for many serious long-lasting (chronic) diseases. Quitting smoking is hard, but it is one of the best things that you can do for your health. It is never too late to quit. What are the benefits of quitting smoking? When you quit smoking, you lower your risk for getting serious diseases and conditions. They can include:  Lung cancer or lung disease.  Heart disease.  Stroke.  Heart attack.  Not being able to have children (infertility).  Weak bones (osteoporosis) and broken bones (fractures). If you have coughing, wheezing, and shortness of breath, those symptoms may get better when you quit. You may also get sick less often. If you are pregnant, quitting smoking can help to lower your chances of having a baby of low birth weight. What can I do to help me quit smoking? Talk with your doctor about what can help you quit smoking. Some things you can do (strategies) include:  Quitting smoking totally, instead of slowly cutting back how much you smoke over a period of time.  Going to in-person counseling. You are more likely to quit if you go to many counseling sessions.  Using resources and support systems, such as:  Online chats with a counselor.  Phone quitlines.  Printed self-help materials.  Support groups or group counseling.  Text messaging programs.  Mobile phone apps or applications.  Taking medicines. Some of these medicines may have nicotine in them. If you are pregnant or breastfeeding, do not take any medicines to quit smoking unless your doctor says it is okay. Talk with your doctor about counseling or other things that can help you. Talk with your doctor about using more than one strategy at the same time, such as taking medicines while you are also going to in-person counseling. This can help make quitting  easier. What things can I do to make it easier to quit? Quitting smoking might feel very hard at first, but there is a lot that you can do to make it easier. Take these steps:  Talk to your family and friends. Ask them to support and encourage you.  Call phone quitlines, reach out to support groups, or work with a counselor.  Ask people who smoke to not smoke around you.  Avoid places that make you want (trigger) to smoke, such as:  Bars.  Parties.  Smoke-break areas at work.  Spend time with people who do not smoke.  Lower the stress in your life. Stress can make you want to smoke. Try these things to help your stress:  Getting regular exercise.  Deep-breathing exercises.  Yoga.  Meditating.  Doing a body scan. To do this, close your eyes, focus on one area of your body at a time from head to toe, and notice which parts of your body are tense. Try to relax the muscles in those areas.  Download or buy apps on your mobile phone or tablet that can help you stick to your quit plan. There are many free apps, such as QuitGuide from the CDC (Centers for Disease Control and Prevention). You can find more support from smokefree.gov and other websites. This information is not intended to replace advice given to you by your health care provider. Make sure you discuss any questions you have with your health care provider. Document Released: 11/03/2008 Document Revised: 09/05/2015 Document   Reviewed: 05/24/2014 Elsevier Interactive Patient Education  2017 [place]  What are the risk, side effects and possible complications? Generally speaking, most procedures are safe.  However, with any procedure there are risks, side effects, and the possibility of complications.  The risks and complications are dependent upon the sites that are lesioned, or the type of nerve block to be performed.  The closer the procedure is to the spine, the more serious the risks  are.  Great care is taken when placing the radio frequency needles, block needles or lesioning probes, but sometimes complications can occur. 1. Infection: Any time there is an injection through the skin, there is a risk of infection.  This is why sterile conditions are used for these blocks.  There are four possible types of infection. 1. Localized skin infection. 2. Central Nervous System Infection-This can be in the form of Meningitis, which can be deadly. 3. Epidural Infections-This can be in the form of an epidural abscess, which can cause pressure inside of the spine, causing compression of the spinal cord with subsequent paralysis. This would require an emergency surgery to decompress, and there are no guarantees that the patient would recover from the paralysis. 4. Discitis-This is an infection of the intervertebral discs.  It occurs in about 1% of discography procedures.  It is difficult to treat and it may lead to surgery.        2. Pain: the needles have to go through skin and soft tissues, will cause soreness.       3. Damage to internal structures:  The nerves to be lesioned may be near blood vessels or    other nerves which can be potentially damaged.       4. Bleeding: Bleeding is more common if the patient is taking blood thinners such as  aspirin, Coumadin, Ticiid, Plavix, etc., or if he/she have some genetic predisposition  such as hemophilia. Bleeding into the spinal canal can cause compression of the spinal  cord with subsequent paralysis.  This would require an emergency surgery to  decompress and there are no guarantees that the patient would recover from the  paralysis.       5. Pneumothorax:  Puncturing of a lung is a possibility, every time a needle is introduced in  the area of the chest or upper back.  Pneumothorax refers to free air around the  collapsed lung(s), inside of the thoracic cavity (chest cavity).  Another two possible  complications related to a similar event would  include: Hemothorax and Chylothorax.   These are variations of the Pneumothorax, where instead of air around the collapsed  lung(s), you may have blood or chyle, respectively.       6. Spinal headaches: They may occur with any procedures in the area of the spine.       7. Persistent CSF (Cerebro-Spinal Fluid) leakage: This is a rare problem, but may occur  with prolonged intrathecal or epidural catheters either due to the formation of a fistulous  track or a dural tear.       8. Nerve damage: By working so close to the spinal cord, there is always a possibility of  nerve damage, which could be as serious as a permanent spinal cord injury with  paralysis.       9. Death:  Although rare, severe deadly allergic reactions known as "Anaphylactic  reaction" can occur to any of the medications used.      10.  Worsening of the symptoms:  We can always make thing worse.  What are the chances of something like this happening? Chances of any of this occuring are extremely low.  By statistics, you have more of a chance of getting killed in a motor vehicle accident: while driving to the hospital than any of the above occurring .  Nevertheless, you should be aware that they are possibilities.  In general, it is similar to taking a shower.  Everybody knows that you can slip, hit your head and get killed.  Does that mean that you should not shower again?  Nevertheless always keep in mind that statistics do not mean anything if you happen to be on the wrong side of them.  Even if a procedure has a 1 (one) in a 1,000,000 (million) chance of going wrong, it you happen to be that one..Also, keep in mind that by statistics, you have more of a chance of having something go wrong when taking medications.  Who should not have this procedure? If you are on a blood thinning medication (e.g. Coumadin, Plavix, see list of "Blood Thinners"), or if you have an active infection going on, you should not have the procedure.  If you are  taking any blood thinners, please inform your physician.  How should I prepare for this procedure?  Do not eat or drink anything at least six hours prior to the procedure.  Bring a driver with you .  It cannot be a taxi.  Come accompanied by an adult that can drive you back, and that is strong enough to help you if your legs get weak or numb from the local anesthetic.  Take all of your medicines the morning of the procedure with just enough water to swallow them.  If you have diabetes, make sure that you are scheduled to have your procedure done first thing in the morning, whenever possible.  If you have diabetes, take only half of your insulin dose and notify our nurse that you have done so as soon as you arrive at the clinic.  If you are diabetic, but only take blood sugar pills (oral hypoglycemic), then do not take them on the morning of your procedure.  You may take them after you have had the procedure.  Do not take aspirin or any aspirin-containing medications, at least eleven (11) days prior to the procedure.  They may prolong bleeding.  Wear loose fitting clothing that may be easy to take off and that you would not mind if it got stained with Betadine or blood.  Do not wear any jewelry or perfume  Remove any nail coloring.  It will interfere with some of our monitoring equipment.  NOTE: Remember that this is not meant to be interpreted as a complete list of all possible complications.  Unforeseen problems may occur.  BLOOD THINNERS The following drugs contain aspirin or other products, which can cause increased bleeding during surgery and should not be taken for 2 weeks prior to and 1 week after surgery.  If you should need take something for relief of minor pain, you may take acetaminophen which is found in Tylenol,m Datril, Anacin-3 and Panadol. It is not blood thinner. The products listed below are.  Do not take any of the products listed below in addition to any listed on  your instruction sheet.  A.P.C or A.P.C with Codeine Codeine Phosphate Capsules #3 Ibuprofen Ridaura  ABC compound Congesprin Imuran rimadil  Advil Cope Indocin Robaxisal  Alka-Seltzer  Effervescent Pain Reliever and Antacid Coricidin or Coricidin-D  Indomethacin Rufen  Alka-Seltzer plus Cold Medicine Cosprin Ketoprofen S-A-C Tablets  Anacin Analgesic Tablets or Capsules Coumadin Korlgesic Salflex  Anacin Extra Strength Analgesic tablets or capsules CP-2 Tablets Lanoril Salicylate  Anaprox Cuprimine Capsules Levenox Salocol  Anexsia-D Dalteparin Magan Salsalate  Anodynos Darvon compound Magnesium Salicylate Sine-off  Ansaid Dasin Capsules Magsal Sodium Salicylate  Anturane Depen Capsules Marnal Soma  APF Arthritis pain formula Dewitt's Pills Measurin Stanback  Argesic Dia-Gesic Meclofenamic Sulfinpyrazone  Arthritis Bayer Timed Release Aspirin Diclofenac Meclomen Sulindac  Arthritis pain formula Anacin Dicumarol Medipren Supac  Analgesic (Safety coated) Arthralgen Diffunasal Mefanamic Suprofen  Arthritis Strength Bufferin Dihydrocodeine Mepro Compound Suprol  Arthropan liquid Dopirydamole Methcarbomol with Aspirin Synalgos  ASA tablets/Enseals Disalcid Micrainin Tagament  Ascriptin Doan's Midol Talwin  Ascriptin A/D Dolene Mobidin Tanderil  Ascriptin Extra Strength Dolobid Moblgesic Ticlid  Ascriptin with Codeine Doloprin or Doloprin with Codeine Momentum Tolectin  Asperbuf Duoprin Mono-gesic Trendar  Aspergum Duradyne Motrin or Motrin IB Triminicin  Aspirin plain, buffered or enteric coated Durasal Myochrisine Trigesic  Aspirin Suppositories Easprin Nalfon Trillsate  Aspirin with Codeine Ecotrin Regular or Extra Strength Naprosyn Uracel  Atromid-S Efficin Naproxen Ursinus  Auranofin Capsules Elmiron Neocylate Vanquish  Axotal Emagrin Norgesic Verin  Azathioprine Empirin or Empirin with Codeine Normiflo Vitamin E  Azolid Emprazil Nuprin Voltaren  Bayer Aspirin plain, buffered or  children's or timed BC Tablets or powders Encaprin Orgaran Warfarin Sodium  Buff-a-Comp Enoxaparin Orudis Zorpin  Buff-a-Comp with Codeine Equegesic Os-Cal-Gesic   Buffaprin Excedrin plain, buffered or Extra Strength Oxalid   Bufferin Arthritis Strength Feldene Oxphenbutazone   Bufferin plain or Extra Strength Feldene Capsules Oxycodone with Aspirin   Bufferin with Codeine Fenoprofen Fenoprofen Pabalate or Pabalate-SF   Buffets II Flogesic Panagesic   Buffinol plain or Extra Strength Florinal or Florinal with Codeine Panwarfarin   Buf-Tabs Flurbiprofen Penicillamine   Butalbital Compound Four-way cold tablets Penicillin   Butazolidin Fragmin Pepto-Bismol   Carbenicillin Geminisyn Percodan   Carna Arthritis Reliever Geopen Persantine   Carprofen Gold's salt Persistin   Chloramphenicol Goody's Phenylbutazone   Chloromycetin Haltrain Piroxlcam   Clmetidine heparin Plaquenil   Cllnoril Hyco-pap Ponstel   Clofibrate Hydroxy chloroquine Propoxyphen         Before stopping any of these medications, be sure to consult the physician who ordered them.  Some, such as Coumadin (Warfarin) are ordered to prevent or treat serious conditions such as "deep thrombosis", "pumonary embolisms", and other heart problems.  The amount of time that you may need off of the medication may also vary with the medication and the reason for which you were taking it.  If you are taking any of these medications, please make sure you notify your pain physician before you undergo any procedures.         Epidural Steroid Injection Patient Information  Description: The epidural space surrounds the nerves as they exit the spinal cord.  In some patients, the nerves can be compressed and inflamed by a bulging disc or a tight spinal canal (spinal stenosis).  By injecting steroids into the epidural space, we can bring irritated nerves into direct contact with a potentially helpful medication.  These steroids act directly on  the irritated nerves and can reduce swelling and inflammation which often leads to decreased pain.  Epidural steroids may be injected anywhere along the spine and from the neck to the low back depending upon the location of your pain.   After numbing  the skin with local anesthetic (like Novocaine), a small needle is passed into the epidural space slowly.  You may experience a sensation of pressure while this is being done.  The entire block usually last less than 10 minutes.  Conditions which may be treated by epidural steroids:   Low back and leg pain  Neck and arm pain  Spinal stenosis  Post-laminectomy syndrome  Herpes zoster (shingles) pain  Pain from compression fractures  Preparation for the injection:  1. Do not eat any solid food or dairy products within 8 hours of your appointment.  2. You may drink clear liquids up to 3 hours before appointment.  Clear liquids include water, black coffee, juice or soda.  No milk or cream please. 3. You may take your regular medication, including pain medications, with a sip of water before your appointment  Diabetics should hold regular insulin (if taken separately) and take 1/2 normal NPH dos the morning of the procedure.  Carry some sugar containing items with you to your appointment. 4. A driver must accompany you and be prepared to drive you home after your procedure.  5. Bring all your current medications with your. 6. An IV may be inserted and sedation may be given at the discretion of the physician.   7. A blood pressure cuff, EKG and other monitors will often be applied during the procedure.  Some patients may need to have extra oxygen administered for a short period. 8. You will be asked to provide medical information, including your allergies, prior to the procedure.  We must know immediately if you are taking blood thinners (like Coumadin/Warfarin)  Or if you are allergic to IV iodine contrast (dye). We must know if you could possible  be pregnant.  Possible side-effects:  Bleeding from needle site  Infection (rare, may require surgery)  Nerve injury (rare)  Numbness & tingling (temporary)  Difficulty urinating (rare, temporary)  Spinal headache ( a headache worse with upright posture)  Light -headedness (temporary)  Pain at injection site (several days)  Decreased blood pressure (temporary)  Weakness in arm/leg (temporary)  Pressure sensation in back/neck (temporary)  Call if you experience:  Fever/chills associated with headache or increased back/neck pain.  Headache worsened by an upright position.  New onset weakness or numbness of an extremity below the injection site  Hives or difficulty breathing (go to the emergency room)  Inflammation or drainage at the infection site  Severe back/neck pain  Any new symptoms which are concerning to you  Please note:  Although the local anesthetic injected can often make your back or neck feel good for several hours after the injection, the pain will likely return.  It takes 3-7 days for steroids to work in the epidural space.  You may not notice any pain relief for at least that one week.  If effective, we will often do a series of three injections spaced 3-6 weeks apart to maximally decrease your pain.  After the initial series, we generally will wait several months before considering a repeat injection of the same type.  If you have any questions, please call [phone] [facility]

## 2015-12-25 ENCOUNTER — Telehealth: Payer: Self-pay

## 2015-12-25 NOTE — Telephone Encounter (Signed)
Patient left a voicemail stating her medication needs prior authorization at [facility].

## 2015-12-27 ENCOUNTER — Ambulatory Visit
Admission: RE | Admit: 2015-12-27 | Discharge: 2015-12-27 | Disposition: A | Discharge status: Home / Self Care | Payer: Medicaid Other | Source: Ambulatory Visit | Attending: Pain Medicine | Admitting: Pain Medicine

## 2015-12-27 ENCOUNTER — Encounter: Payer: Self-pay | Admitting: Pain Medicine

## 2015-12-27 ENCOUNTER — Ambulatory Visit (HOSPITAL_BASED_OUTPATIENT_CLINIC_OR_DEPARTMENT_OTHER): Payer: Medicaid Other | Admitting: Pain Medicine

## 2015-12-27 VITALS — BP 131/74 | HR 62 | Temp 98.2°F | Resp 18 | Ht 63.0 in | Wt 150.0 lb

## 2015-12-27 DIAGNOSIS — M25511 Pain in right shoulder: Secondary | ICD-10-CM | POA: Insufficient documentation

## 2015-12-27 DIAGNOSIS — M542 Cervicalgia: Secondary | ICD-10-CM

## 2015-12-27 DIAGNOSIS — M5412 Radiculopathy, cervical region: Secondary | ICD-10-CM

## 2015-12-27 DIAGNOSIS — M25512 Pain in left shoulder: Secondary | ICD-10-CM | POA: Diagnosis not present

## 2015-12-27 DIAGNOSIS — G8929 Other chronic pain: Secondary | ICD-10-CM

## 2015-12-27 MED ORDER — SODIUM CHLORIDE 0.9 % IJ SOLN
INTRAMUSCULAR | Status: AC
  Filled 2015-12-27: qty 10

## 2015-12-27 MED ORDER — DEXAMETHASONE SODIUM PHOSPHATE 4 MG/ML IJ SOLN
10.0000 mg | Freq: Once | INTRAMUSCULAR | Status: AC
  Administered 2015-12-27: 10 mg
  Filled 2015-12-27: qty 3

## 2015-12-27 MED ORDER — MIDAZOLAM HCL 5 MG/5ML IJ SOLN
1.0000 mg | INTRAMUSCULAR | Status: DC | PRN
  Administered 2015-12-27: 2 mg via INTRAVENOUS
  Filled 2015-12-27: qty 5

## 2015-12-27 MED ORDER — ROPIVACAINE HCL 2 MG/ML IJ SOLN
2.0000 mL | Freq: Once | INTRAMUSCULAR | Status: AC
Start: 2015-12-27 — End: 2015-12-27
  Administered 2015-12-27: 2 mL via EPIDURAL
  Filled 2015-12-27: qty 10

## 2015-12-27 MED ORDER — LIDOCAINE HCL (PF) 1 % IJ SOLN
10.0000 mL | Freq: Once | INTRAMUSCULAR | Status: AC
  Administered 2015-12-27: 10 mL
  Filled 2015-12-27: qty 10

## 2015-12-27 MED ORDER — LACTATED RINGERS IV SOLN: 1000.0000 mL | Freq: Once | INTRAVENOUS | Status: DC

## 2015-12-27 MED ORDER — FENTANYL CITRATE (PF) 100 MCG/2ML IJ SOLN
25.0000 ug | INTRAMUSCULAR | Status: DC | PRN
  Administered 2015-12-27: 50 ug via INTRAVENOUS
  Filled 2015-12-27: qty 2

## 2015-12-27 MED ORDER — IOPAMIDOL (ISOVUE-M 200) INJECTION 41%
10.0000 mL | Freq: Once | INTRAMUSCULAR | Status: AC
  Administered 2015-12-27: 10 mL via EPIDURAL
  Filled 2015-12-27: qty 10

## 2015-12-27 MED ORDER — SODIUM CHLORIDE 0.9% FLUSH
2.0000 mL | Freq: Once | INTRAVENOUS | Status: AC
  Administered 2015-12-27: 2 mL

## 2015-12-27 NOTE — Progress Notes (Signed)
Patient's Name: Tanya [name]  MRN: [id number]  Referring Provider: Ricardo Jericho*  DOB: April 26, 1960  PCP: Ricardo Jericho, NP  DOS: 12/27/2015  Note by: Kathlen Brunswick. Dossie Arbour, MD  Service setting: Ambulatory outpatient  Location: [facility] (AMB) Pain Management Facility  Visit type: Procedure  Specialty: Interventional Pain Management  Patient type: Established   Primary Reason for Visit: Interventional Pain Management Treatment. CC: Neck Pain (mid) and Back Pain (lower)  Procedure:  Anesthesia, Analgesia, Anxiolysis:  Type: Diagnostic, Inter-Laminar, Epidural Steroid Injection Region: Posterior Cervico-thoracic Region Level: C7-T1 Laterality: Right-Sided Paramedial  Type: Local Anesthesia with Moderate (Conscious) Sedation Local Anesthetic: Lidocaine 1% Route: Intravenous (IV) IV Access: Secured Sedation: Meaningful verbal contact was maintained at all times during the procedure  Indication(s): Analgesia and Anxiety  Indications: 1. Chronic cervical radicular pain (Right)   2. Chronic neck pain (Location of Secondary source of pain) (Bilateral) (R>L)   3. Chronic shoulder pain (Location of Primary Source of Pain) (Bilateral) (R>L)    Pain Score: Pre-procedure: 4 /10 Post-procedure: 0-No pain/10  Pre-Procedure Assessment:  Tanya [name] is a 55 y.o. (year old), female patient, seen today for interventional treatment. She  has a past surgical history that includes Abdominal hysterectomy.. Her primarily concern today is the Neck Pain (mid) and Back Pain (lower) The primary encounter diagnosis was Chronic cervical radicular pain (Right). Diagnoses of Chronic neck pain (Location of Secondary source of pain) (Bilateral) (R>L) and Chronic shoulder pain (Location of Primary Source of Pain) (Bilateral) (R>L) were also pertinent to this visit.  Pain Type: Chronic pain Pain Location: Neck Pain Orientation: Right Pain Descriptors / Indicators: Numbness, Aching, Constant,  Burning Pain Frequency: Constant  Date of Last Visit: 12/21/15 Service Provided on Last Visit: Med Refill  Coagulation Parameters No results found for: INR, LABPROT, APTT, PLT Verification of the correct person, correct site (including marking of site), and correct procedure were performed and confirmed by the patient.  Consent: Before the procedure and under the influence of no sedative(s), amnesic(s), or anxiolytics, the patient was informed of the treatment options, risks and possible complications. To fulfill our ethical and legal obligations, as recommended by the American Medical Association's Code of Ethics, I have informed the patient of my clinical impression; the nature and purpose of the treatment or procedure; the risks, benefits, and possible complications of the intervention; the alternatives, including doing nothing; the risk(s) and benefit(s) of the alternative treatment(s) or procedure(s); and the risk(s) and benefit(s) of doing nothing. The patient was provided information about the general risks and possible complications associated with the procedure. These may include, but are not limited to: failure to achieve desired goals, infection, bleeding, organ or nerve damage, allergic reactions, paralysis, and death. In addition, the patient was informed of those risks and complications associated to Spine-related procedures, such as failure to decrease pain; infection (i.e.: Meningitis, epidural or intraspinal abscess); bleeding (i.e.: epidural hematoma, subarachnoid hemorrhage, or any other type of intraspinal or peri-dural bleeding); organ or nerve damage (i.e.: Any type of peripheral nerve, nerve root, or spinal cord injury) with subsequent damage to sensory, motor, and/or autonomic systems, resulting in permanent pain, numbness, and/or weakness of one or several areas of the body; allergic reactions; (i.e.: anaphylactic reaction); and/or death. Furthermore, the patient was informed  of those risks and complications associated with the medications. These include, but are not limited to: allergic reactions (i.e.: anaphylactic or anaphylactoid reaction(s)); adrenal axis suppression; blood sugar elevation that in diabetics may result in ketoacidosis or comma;  water retention that in patients with history of congestive heart failure may result in shortness of breath, pulmonary edema, and decompensation with resultant heart failure; weight gain; swelling or edema; medication-induced neural toxicity; particulate matter embolism and blood vessel occlusion with resultant organ, and/or nervous system infarction; and/or aseptic necrosis of one or more joints. Finally, the patient was informed that Medicine is not an exact science; therefore, there is also the possibility of unforeseen or unpredictable risks and/or possible complications that may result in a catastrophic outcome. The patient indicated having understood very clearly. We have given the patient no guarantees and we have made no promises. Enough time was given to the patient to ask questions, all of which were answered to the patient's satisfaction. Tanya [name] has indicated that she wanted to continue with the procedure.  Consent Attestation: I, the ordering provider, attest that I have discussed with the patient the benefits, risks, side-effects, alternatives, likelihood of achieving goals, and potential problems during recovery for the procedure that I have provided informed consent.  Pre-Procedure Preparation:  Safety Precautions: Allergies reviewed. The patient was asked about blood thinners, or active infections, both of which were denied. The patient was asked to confirm the procedure and laterality, before marking the site, and again before commencing the procedure. Appropriate site, procedure, and patient were confirmed by following the Joint Commission's Universal Protocol (UP.01.01.01), in the form of a "Time Out". The  patient was asked to participate by confirming the accuracy of the "Time Out" information. Patient was assessed for positional comfort and pressure points before starting the procedure. Allergies: She is allergic to cyclobenzaprine; methocarbamol; trazodone and nefazodone; bupropion; oxycodone-acetaminophen; and tramadol. Allergy Precautions: None required Infection Control Precautions: Sterile technique used. Standard Universal Precautions were taken as recommended by the Department of [facility] for Disease Control and Prevention (CDC). Standard pre-surgical skin prep was conducted. Respiratory hygiene and cough etiquette was practiced. Hand hygiene observed. Safe injection practices and needle disposal techniques followed. SDV (single dose vial) medications used. Medications properly checked for expiration dates and contaminants. Personal protective equipment (PPE) used as per protocol. Monitoring:  As per clinic protocol. Vitals:   12/27/15 1024 12/27/15 1029 12/27/15 1034 12/27/15 1039  BP: 115/79 131/72 121/67 119/74  Pulse: 63 63 69 62  Resp: 11 13 14 11   Temp:      TempSrc:      SpO2: 95% 91% 95% 94%  Weight:      Height:      Calculated BMI: Body mass index is 26.57 kg/m. Time-out: "Time-out" completed before starting procedure, as per protocol.  Imaging Review  Cervical Imaging: Cervical MR wo contrast:  Results for orders placed during the hospital encounter of 09/20/15  MR Cervical Spine Wo Contrast   Narrative CLINICAL DATA:  Long history of neck and back pain.  EXAM: MRI CERVICAL, THORACIC  WITHOUT CONTRAST  TECHNIQUE: Multiplanar and multiecho pulse sequences of the cervical spine, to include the craniocervical junction and cervicothoracic junction and thoracic spine were obtained without intravenous contrast.  COMPARISON:  None.  FINDINGS: MRI CERVICAL SPINE FINDINGS  Examination is limited due to patient motion and poor image quality.  Alignment:  Normal.  Vertebrae: Grossly normal.  Cord: Grossly normal.  Posterior Fossa, vertebral arteries, paraspinal tissues: No significant findings.  Disc levels:  C2-3:  No significant findings.  C3-4: Shallow central disc protrusion with mild mass effect on the ventral thecal sac. Mild narrowing of the ventral CSF space. Mild right foraminal narrowing due to  uncinate spurring.  C4-5: Bulging annulus, osteophytic ridging and uncinate spurring with mass effect on the ventral thecal sac, asymmetric right. Narrowing of the ventral CSF space. Moderate bilateral foraminal stenosis.  C5-6: Shallow central disc protrusion, osteophytic ridging and uncinate spurring with flattening of the ventral thecal sac and narrowing of the ventral CSF space. Moderate left and mild right foraminal stenosis.  C6-7: Broad-based disc protrusion asymmetrical left with mass effect on the thecal sac and narrowing of the ventral CSF space. No significant foraminal stenosis.  C7-T1: No significant findings.  MRI THORACIC SPINE FINDINGS  Alignment:  Normal  Vertebrae: Normal  Cord:  Normal  Paraspinal and other soft tissues: Unremarkable  Disc levels:  No significant thoracic disc protrusions, spinal or foraminal stenosis. Shallow central disc protrusion noted at T7-8 with minimal impression on the ventral thecal sac.  IMPRESSION: 1. 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 2. Normal thoracic spine examination except for mild disc disease and facet disease and a shallow central disc protrusion at T7-8.   Electronically Signed   By: Marijo Sanes M.D.   On: 09/20/2015 11:04    Description of Procedure Process:   Time-out: "Time-out" completed before starting procedure, as per protocol. Position:  Prone with head of the table was raised to facilitate breathing. Target Area: For Epidural Steroid injections the target is the interlaminar space, initially targeting the lower border of the superior vertebral body lamina. Approach: Paramedial approach. Area Prepped: Entire PosteriorCervical Region Prepping solution: ChloraPrep (2% chlorhexidine gluconate and 70% isopropyl alcohol) Safety Precautions: Aspiration looking for blood return was conducted prior to all injections. At no point did we inject any substances, as a needle was being advanced. No attempts were made at seeking any paresthesias. Safe injection practices and needle disposal techniques used. Medications properly checked for expiration dates. SDV (single dose vial) medications used. Description of the Procedure: Protocol guidelines were followed. The procedure needle was introduced through the skin, ipsilateral to the reported pain, and advanced to the target area. Bone was contacted and the needle walked caudad, until the lamina was cleared. The epidural space was identified using "loss-of-resistance technique" with 2-3 ml of PF-NaCl (0.9% NSS), in a 5cc LOR glass syringe. EBL: None Materials & Medications:  Needle(s) Used: 20g - 10cm, Tuohy-style epidural needle Medication(s): see below.  Imaging Guidance (Spinal):  Type of Imaging Technique: Fluoroscopy Guidance (Spinal) Indication(s): Assistance in needle guidance and placement for procedures requiring needle placement in or near specific anatomical locations not easily accessible without such assistance. Exposure Time: Please see nurses notes. Contrast: Before injecting any contrast, we confirmed that the patient did not have an allergy to iodine, shellfish, or radiological contrast. Once satisfactory needle placement was completed at the desired level, radiological contrast was injected. Contrast injected under live fluoroscopy. No contrast complications. See chart for type and  volume of contrast used. Fluoroscopic Guidance: I was personally present during the use of fluoroscopy. "Tunnel Vision Technique" used to obtain the best possible view of the target area. Parallax error corrected before commencing the procedure. "Direction-depth-direction" technique used to introduce the needle under continuous pulsed fluoroscopy. Once target was reached, antero-posterior, oblique, and lateral fluoroscopic projection used confirm needle placement in all planes. Images permanently stored in EMR. Interpretation: I personally interpreted the imaging intraoperatively. Adequate needle placement confirmed in multiple planes. Appropriate spread of contrast into  desired area was observed. No evidence of afferent or efferent intravascular uptake. No intrathecal or subarachnoid spread observed. Permanent images saved into the patient's record.  Antibiotic Prophylaxis:  Indication(s): No indications identified. Type:  Antibiotics Given (last 72 hours)    None      Post-operative Assessment:  Complications: No immediate post-treatment complications observed by team, or reported by patient. Disposition: The patient tolerated the entire procedure well. A repeat set of vitals were taken after the procedure and the patient was kept under observation following institutional policy, for this type of procedure. Post-procedural neurological assessment was performed, showing return to baseline, prior to discharge. The patient was provided with post-procedure discharge instructions, including a section on how to identify potential problems. Should any problems arise concerning this procedure, the patient was given instructions to immediately contact us, at any time, without hesitation. In any case, we plan to contact the patient by telephone for a follow-up status report regarding this interventional procedure. Comments:  No additional relevant information.  Plan of Care  Discharge to: Discharge  home  Medications ordered for procedure: Meds ordered this encounter  Medications  . fentaNYL (SUBLIMAZE) injection 25-50 mcg    Make sure Narcan is available in the pyxis when using this medication. In the event of respiratory depression (RR< 8/min): Titrate NARCAN (naloxone) in increments of 0.1 to 0.2 mg IV at 2-3 minute intervals, until desired degree of reversal.  . lactated ringers infusion 1,000 mL  . midazolam (VERSED) 5 MG/5ML injection 1-2 mg    Make sure Flumazenil is available in the pyxis when using this medication. If oversedation occurs, administer 0.2 mg IV over 15 sec. If after 45 sec no response, administer 0.2 mg again over 1 min; may repeat at 1 min intervals; not to exceed 4 doses (1 mg)  . dexamethasone (DECADRON) injection 10 mg  . iopamidol (ISOVUE-M) 41 % intrathecal injection 10 mL  . lidocaine (PF) (XYLOCAINE) 1 % injection 10 mL  . sodium chloride flush (NS) 0.9 % injection 2 mL  . ropivacaine (PF) 2 mg/mL (0.2%) (NAROPIN) injection 2 mL  . sodium chloride 0.9 % injection    Florene Glen, Patti: cabinet override   Medications administered: (For more details, see medical record) We administered fentaNYL, midazolam, dexamethasone, iopamidol, lidocaine (PF), sodium chloride flush, and ropivacaine (PF) 2 mg/mL (0.2%). Lab-work, Procedure(s), & Referral(s) Ordered: Orders Placed This Encounter  Procedures  . Cervical Epidural Injection  . DG C-Arm 1-60 Min-No Report   Imaging Ordered: No results found for this or any previous visit. New Prescriptions   No medications on file   Physician-requested Follow-up:  Return in about 2 weeks (around 01/10/2016) for Post-Procedure evaluation.  Future Appointments Date Time Provider [place]  02/06/2016 1:45 PM Milinda Pointer, MD [facility] None   Primary Care Physician: Ricardo Jericho, NP Location: [facility] Note by: Kathlen Brunswick. Dossie Arbour, M.D, DABA, DABAPM, DABPM, DABIPP,  FIPP Date: 12/27/15; Time: 10:55 AM  Disclaimer:  Medicine is not an exact science. The only guarantee in medicine is that nothing is guaranteed. It is important to note that the decision to proceed with this intervention was based on the information collected from the patient. The Data and conclusions were drawn from the patient's questionnaire, the interview, and the physical examination. Because the information was provided in large part by the patient, it cannot be guaranteed that it has not been purposely or unconsciously manipulated. Every effort has been made to obtain as much relevant  data as possible for this evaluation. It is important to note that the conclusions that lead to this procedure are derived in large part from the available data. Always take into account that the treatment will also be dependent on availability of resources and existing treatment guidelines, considered by other Pain Management Practitioners as being common knowledge and practice, at the time of the intervention. For Medico-Legal purposes, it is also important to point out that variation in procedural techniques and pharmacological choices are the acceptable norm. The indications, contraindications, technique, and results of the above procedure should only be interpreted and judged by a Board-Certified Interventional Pain Specialist with extensive familiarity and expertise in the same exact procedure and technique. Attempts at providing opinions without similar or greater experience and expertise than that of the treating physician will be considered as inappropriate and unethical, and shall result in a formal complaint to the state medical board and applicable specialty societies.  Instructions provided at this appointment: Patient Instructions  Epidural Steroid Injection Patient Information  Description: The epidural space surrounds the nerves as they exit the spinal cord.  In some patients, the nerves can be  compressed and inflamed by a bulging disc or a tight spinal canal (spinal stenosis).  By injecting steroids into the epidural space, we can bring irritated nerves into direct contact with a potentially helpful medication.  These steroids act directly on the irritated nerves and can reduce swelling and inflammation which often leads to decreased pain.  Epidural steroids may be injected anywhere along the spine and from the neck to the low back depending upon the location of your pain.   After numbing the skin with local anesthetic (like Novocaine), a small needle is passed into the epidural space slowly.  You may experience a sensation of pressure while this is being done.  The entire block usually last less than 10 minutes.  Conditions which may be treated by epidural steroids:   Low back and leg pain  Neck and arm pain  Spinal stenosis  Post-laminectomy syndrome  Herpes zoster (shingles) pain  Pain from compression fractures  Preparation for the injection:  1. Do not eat any solid food or dairy products within 8 hours of your appointment.  2. You may drink clear liquids up to 3 hours before appointment.  Clear liquids include water, black coffee, juice or soda.  No milk or cream please. 3. You may take your regular medication, including pain medications, with a sip of water before your appointment  Diabetics should hold regular insulin (if taken separately) and take 1/2 normal NPH dos the morning of the procedure.  Carry some sugar containing items with you to your appointment. 4. A driver must accompany you and be prepared to drive you home after your procedure.  5. Bring all your current medications with your. 6. An IV may be inserted and sedation may be given at the discretion of the physician.   7. A blood pressure cuff, EKG and other monitors will often be applied during the procedure.  Some patients may need to have extra oxygen administered for a short period. 8. You will be asked  to provide medical information, including your allergies, prior to the procedure.  We must know immediately if you are taking blood thinners (like Coumadin/Warfarin)  Or if you are allergic to IV iodine contrast (dye). We must know if you could possible be pregnant.  Possible side-effects:  Bleeding from needle site  Infection (rare, may require surgery)  Nerve  injury (rare)  Numbness & tingling (temporary)  Difficulty urinating (rare, temporary)  Spinal headache ( a headache worse with upright posture)  Light -headedness (temporary)  Pain at injection site (several days)  Decreased blood pressure (temporary)  Weakness in arm/leg (temporary)  Pressure sensation in back/neck (temporary)  Call if you experience:  Fever/chills associated with headache or increased back/neck pain.  Headache worsened by an upright position.  New onset weakness or numbness of an extremity below the injection site  Hives or difficulty breathing (go to the emergency room)  Inflammation or drainage at the infection site  Severe back/neck pain  Any new symptoms which are concerning to you  Please note:  Although the local anesthetic injected can often make your back or neck feel good for several hours after the injection, the pain will likely return.  It takes 3-7 days for steroids to work in the epidural space.  You may not notice any pain relief for at least that one week.  If effective, we will often do a series of three injections spaced 3-6 weeks apart to maximally decrease your pain.  After the initial series, we generally will wait several months before considering a repeat injection of the same type.  If you have any questions, please call [phone] [facility]  General Discharge Instructions :  If you need to reach your doctor call: Monday-Friday 8:00 am - 4:00 pm at [phone] or toll free  [phone].  After clinic hours [phone] to have operator reach doctor.  Bring all of your medication bottles to all your appointments in the pain clinic.  To cancel or reschedule your appointment with Pain Management please remember to call 24 hours in advance to avoid a fee.  Refer to the educational materials which you have been given on: General Risks, I had my Procedure. Discharge Instructions, Post Sedation.  Post Procedure Instructions:  The drugs you were given will stay in your system until tomorrow, so for the next 24 hours you should not drive, make any legal decisions or drink any alcoholic beverages.  You may eat anything you prefer, but it is better to start with liquids then soups and crackers, and gradually work up to solid foods.  Please notify your doctor immediately if you have any unusual bleeding, trouble breathing or pain that is not related to your normal pain.  Depending on the type of procedure that was done, some parts of your body may feel week and/or numb.  This usually clears up by tonight or the next day.  Walk with the use of an assistive device or accompanied by an adult for the 24 hours.  You may use ice on the affected area for the first 24 hours.  Put ice in a Ziploc bag and cover with a towel and place against area 15 minutes on 15 minutes off.  You may switch to heat after 24 hours.

## 2015-12-27 NOTE — Patient Instructions (Signed)
Epidural Steroid Injection Patient Information  Description: The epidural space surrounds the nerves as they exit the spinal cord.  In some patients, the nerves can be compressed and inflamed by a bulging disc or a tight spinal canal (spinal stenosis).  By injecting steroids into the epidural space, we can bring irritated nerves into direct contact with a potentially helpful medication.  These steroids act directly on the irritated nerves and can reduce swelling and inflammation which often leads to decreased pain.  Epidural steroids may be injected anywhere along the spine and from the neck to the low back depending upon the location of your pain.   After numbing the skin with local anesthetic (like Novocaine), a small needle is passed into the epidural space slowly.  You may experience a sensation of pressure while this is being done.  The entire block usually last less than 10 minutes.  Conditions which may be treated by epidural steroids:   Low back and leg pain  Neck and arm pain  Spinal stenosis  Post-laminectomy syndrome  Herpes zoster (shingles) pain  Pain from compression fractures  Preparation for the injection:  1. Do not eat any solid food or dairy products within 8 hours of your appointment.  2. You may drink clear liquids up to 3 hours before appointment.  Clear liquids include water, black coffee, juice or soda.  No milk or cream please. 3. You may take your regular medication, including pain medications, with a sip of water before your appointment  Diabetics should hold regular insulin (if taken separately) and take 1/2 normal NPH dos the morning of the procedure.  Carry some sugar containing items with you to your appointment. 4. A driver must accompany you and be prepared to drive you home after your procedure.  5. Bring all your current medications with your. 6. An IV may be inserted and sedation may be given at the discretion of the physician.   7. A blood pressure  cuff, EKG and other monitors will often be applied during the procedure.  Some patients may need to have extra oxygen administered for a short period. 8. You will be asked to provide medical information, including your allergies, prior to the procedure.  We must know immediately if you are taking blood thinners (like Coumadin/Warfarin)  Or if you are allergic to IV iodine contrast (dye). We must know if you could possible be pregnant.  Possible side-effects:  Bleeding from needle site  Infection (rare, may require surgery)  Nerve injury (rare)  Numbness & tingling (temporary)  Difficulty urinating (rare, temporary)  Spinal headache ( a headache worse with upright posture)  Light -headedness (temporary)  Pain at injection site (several days)  Decreased blood pressure (temporary)  Weakness in arm/leg (temporary)  Pressure sensation in back/neck (temporary)  Call if you experience:  Fever/chills associated with headache or increased back/neck pain.  Headache worsened by an upright position.  New onset weakness or numbness of an extremity below the injection site  Hives or difficulty breathing (go to the emergency room)  Inflammation or drainage at the infection site  Severe back/neck pain  Any new symptoms which are concerning to you  Please note:  Although the local anesthetic injected can often make your back or neck feel good for several hours after the injection, the pain will likely return.  It takes 3-7 days for steroids to work in the epidural space.  You may not notice any pain relief for at least that one week.    If effective, we will often do a series of three injections spaced 3-6 weeks apart to maximally decrease your pain.  After the initial series, we generally will wait several months before considering a repeat injection of the same type.  If you have any questions, please call [phone] [facility]  Discharge Instructions  General Discharge Instructions :  If you need to reach your doctor call: Monday-Friday 8:00 am - 4:00 pm at [phone] or toll free [phone].  After clinic hours [phone] to have operator reach doctor.  Bring all of your medication bottles to all your appointments in the pain clinic.  To cancel or reschedule your appointment with Pain Management please remember to call 24 hours in advance to avoid a fee.  Refer to the educational materials which you have been given on: General Risks, I had my Procedure. Discharge Instructions, Post Sedation.  Post Procedure Instructions:  The drugs you were given will stay in your system until tomorrow, so for the next 24 hours you should not drive, make any legal decisions or drink any alcoholic beverages.  You may eat anything you prefer, but it is better to start with liquids then soups and crackers, and gradually work up to solid foods.  Please notify your doctor immediately if you have any unusual bleeding, trouble breathing or pain that is not related to your normal pain.  Depending on the type of procedure that was done, some parts of your body may feel week and/or numb.  This usually clears up by tonight or the next day.  Walk with the use of an assistive device or accompanied by an adult for the 24 hours.  You may use ice on the affected area for the first 24 hours.  Put ice in a Ziploc bag and cover with a towel and place against area 15 minutes on 15 minutes off.  You may switch to heat after 24 hours. 

## 2015-12-27 NOTE — Progress Notes (Signed)
Safety precautions to be maintained throughout the outpatient stay will include: orient to surroundings, keep bed in low position, maintain call bell within reach at all times, provide assistance with transfer out of bed and ambulation.  

## 2015-12-28 ENCOUNTER — Telehealth: Payer: Self-pay | Admitting: *Deleted

## 2015-12-28 NOTE — Telephone Encounter (Signed)
No problems post procedure. 

## 2016-02-06 ENCOUNTER — Ambulatory Visit: Payer: Medicaid Other | Attending: Pain Medicine | Admitting: Pain Medicine

## 2016-02-06 ENCOUNTER — Encounter (INDEPENDENT_AMBULATORY_CARE_PROVIDER_SITE_OTHER): Payer: Self-pay

## 2016-02-06 ENCOUNTER — Encounter: Payer: Self-pay | Admitting: Pain Medicine

## 2016-02-06 ENCOUNTER — Ambulatory Visit: Payer: Medicaid Other | Admitting: Pain Medicine

## 2016-02-06 VITALS — BP 160/79 | HR 90 | Temp 97.7°F | Resp 18 | Ht 63.0 in | Wt 143.0 lb

## 2016-02-06 DIAGNOSIS — M542 Cervicalgia: Secondary | ICD-10-CM | POA: Diagnosis not present

## 2016-02-06 DIAGNOSIS — Z79899 Other long term (current) drug therapy: Secondary | ICD-10-CM | POA: Diagnosis not present

## 2016-02-06 DIAGNOSIS — G8929 Other chronic pain: Secondary | ICD-10-CM

## 2016-02-06 DIAGNOSIS — Z823 Family history of stroke: Secondary | ICD-10-CM | POA: Insufficient documentation

## 2016-02-06 DIAGNOSIS — M19011 Primary osteoarthritis, right shoulder: Secondary | ICD-10-CM | POA: Insufficient documentation

## 2016-02-06 DIAGNOSIS — M549 Dorsalgia, unspecified: Secondary | ICD-10-CM

## 2016-02-06 DIAGNOSIS — M5412 Radiculopathy, cervical region: Secondary | ICD-10-CM | POA: Diagnosis not present

## 2016-02-06 DIAGNOSIS — M19012 Primary osteoarthritis, left shoulder: Secondary | ICD-10-CM | POA: Diagnosis not present

## 2016-02-06 DIAGNOSIS — G894 Chronic pain syndrome: Secondary | ICD-10-CM

## 2016-02-06 DIAGNOSIS — Z833 Family history of diabetes mellitus: Secondary | ICD-10-CM | POA: Insufficient documentation

## 2016-02-06 DIAGNOSIS — F419 Anxiety disorder, unspecified: Secondary | ICD-10-CM | POA: Diagnosis not present

## 2016-02-06 DIAGNOSIS — Z79891 Long term (current) use of opiate analgesic: Secondary | ICD-10-CM | POA: Insufficient documentation

## 2016-02-06 DIAGNOSIS — F3340 Major depressive disorder, recurrent, in remission, unspecified: Secondary | ICD-10-CM | POA: Insufficient documentation

## 2016-02-06 DIAGNOSIS — M329 Systemic lupus erythematosus, unspecified: Secondary | ICD-10-CM | POA: Insufficient documentation

## 2016-02-06 DIAGNOSIS — F411 Generalized anxiety disorder: Secondary | ICD-10-CM | POA: Insufficient documentation

## 2016-02-06 DIAGNOSIS — M797 Fibromyalgia: Secondary | ICD-10-CM | POA: Diagnosis not present

## 2016-02-06 DIAGNOSIS — F17210 Nicotine dependence, cigarettes, uncomplicated: Secondary | ICD-10-CM | POA: Diagnosis not present

## 2016-02-06 DIAGNOSIS — E785 Hyperlipidemia, unspecified: Secondary | ICD-10-CM | POA: Insufficient documentation

## 2016-02-06 DIAGNOSIS — M25512 Pain in left shoulder: Secondary | ICD-10-CM

## 2016-02-06 DIAGNOSIS — Z825 Family history of asthma and other chronic lower respiratory diseases: Secondary | ICD-10-CM | POA: Insufficient documentation

## 2016-02-06 DIAGNOSIS — Z809 Family history of malignant neoplasm, unspecified: Secondary | ICD-10-CM | POA: Diagnosis not present

## 2016-02-06 DIAGNOSIS — M25511 Pain in right shoulder: Secondary | ICD-10-CM

## 2016-02-06 DIAGNOSIS — Z8249 Family history of ischemic heart disease and other diseases of the circulatory system: Secondary | ICD-10-CM | POA: Diagnosis not present

## 2016-02-06 DIAGNOSIS — F339 Major depressive disorder, recurrent, unspecified: Secondary | ICD-10-CM

## 2016-02-06 DIAGNOSIS — F1190 Opioid use, unspecified, uncomplicated: Secondary | ICD-10-CM

## 2016-02-06 MED ORDER — MELOXICAM 15 MG PO TABS: 15.0000 mg | ORAL_TABLET | Freq: Every day | ORAL | 2 refills | Status: DC

## 2016-02-06 MED ORDER — CYCLOBENZAPRINE HCL 10 MG PO TABS: 10.0000 mg | ORAL_TABLET | Freq: Three times a day (TID) | ORAL | 2 refills | Status: DC | PRN

## 2016-02-06 MED ORDER — HYDROCODONE-ACETAMINOPHEN 5-325 MG PO TABS: 1.0000 | ORAL_TABLET | Freq: Two times a day (BID) | ORAL | 0 refills | Status: DC

## 2016-02-06 MED ORDER — GABAPENTIN 300 MG PO CAPS: 300.0000 mg | ORAL_CAPSULE | Freq: Three times a day (TID) | ORAL | 2 refills | Status: DC

## 2016-02-06 NOTE — Progress Notes (Signed)
Patient's Name: Tanya [name]  MRN: [id number]  Referring Provider: Luciana Axe, NP  DOB: Apr 28, 1960  PCP: Ricardo Jericho, NP  DOS: 02/06/2016  Note by: Kathlen Brunswick. Dossie Arbour, MD  Service setting: Ambulatory outpatient  Specialty: Interventional Pain Management  Location: [facility] (AMB) Pain Management Facility    Patient type: Established   Primary Reason(s) for Visit: Encounter for prescription drug management & post-procedure evaluation of chronic illness with mild to moderate exacerbation(Level of risk: moderate) CC: Neck Pain  HPI  Tanya [name] is a 56 y.o. year old, female patient, who comes today for a post-procedure evaluation and medication management. She has Anxiety; Chronic upper back pain (Location of Tertiary source of pain) (Bilateral) (R>L); Chronic discoid lupus erythematosus; Chronic low back pain (Bilateral) (R>L); Chronic neck pain (Location of Secondary source of pain) (Bilateral) (R>L); Discoid lupus; Diverticulitis; Encounter for long-term (current) use of high-risk medication; Fibromyalgia; Generalized anxiety disorder; Hypercholesterolemia; Hyperlipidemia; Major depressive disorder, recurrent ([place]); Pain medication agreement signed; Osteoarthritis; SLE (systemic lupus erythematosus) ([place]); Urinary incontinence; Long term prescription benzodiazepine use; Long term current use of opiate analgesic; Long term prescription opiate use; Opiate use (80 MME/Day); Chronic pain syndrome; Chronic upper extremity pain (Right); Chronic cervical radicular pain (Right); Chronic shoulder pain (Location of Primary Source of Pain) (Bilateral) (R>L); 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 Her primarily concern today is the Neck Pain  Pain Assessment: Self-Reported Pain Score: 10-Worst pain ever (Pain scale description given and explained to patient, she maintains that here level is 10)/10 Clinically the patient looks like a  2/10 Reported level is inconsistent with clinical observations. Information on the proper use of the pain score provided to the patient today. Pain Type: Chronic pain Pain Location: Neck Pain Orientation: Right, Left Pain Descriptors / Indicators:  (twisting) Pain Frequency: Constant  Tanya [name] was last seen on 12/27/2015 for a procedure. During today's appointment we reviewed Ms. Toole's post-procedure results, as well as her outpatient medication regimen. Significant improvement in symptoms however, she is having a lot of problems at home that are affecting her psychologically.  Further details on both, my assessment(s), as well as the proposed treatment plan, please see below.  Controlled Substance Pharmacotherapy Assessment REMS (Risk Evaluation and Mitigation Strategy)  Analgesic: Hydrocodone/APAP 5/325 one tablet by mouth twice a day (10 mg/day of hydrocodone) MME/day:41m/day DLandis Martins RN  02/06/2016  2:00 PM  Sign at close encounter Nursing Pain Medication Assessment:  Safety precautions to be maintained throughout the outpatient stay will include: orient to surroundings, keep bed in low position, maintain call bell within reach at all times, provide assistance with transfer out of bed and ambulation.  Medication Inspection Compliance: Pill count conducted under aseptic conditions, in front of the patient. Neither the pills nor the bottle was removed from the patient's sight at any time. Once count was completed pills were immediately returned to the patient in their original bottle.  Medication: Hydrocodone/APAP Pill Count: 0 of 60 pills remain Bottle Appearance: Standard pharmacy container. Clearly labeled. Filled Date: 12 /01 / 2017 Medication last intake:02-06-16 at 1000  Requesting psych referral   Pharmacokinetics: Liberation and absorption (onset of action): WNL Distribution (time to peak effect): WNL Metabolism and excretion (duration of action): WNL          Pharmacodynamics: Desired effects: Analgesia: Ms. SHoreports >50% benefit. Functional ability: Patient reports that medication allows her to accomplish basic ADLs Clinically meaningful improvement in function (CMIF): Sustained CMIF  goals met Perceived effectiveness: Described as relatively effective, allowing for increase in activities of daily living (ADL) Undesirable effects: Side-effects or Adverse reactions: None reported Monitoring: [place] PMP: Online review of the past 49-monthperiod conducted. Compliant with practice rules and regulations List of all UDS test(s) done:  Lab Results  Component Value Date   SUMMARY FINAL 11/23/2015   Last UDS on record: No results found for: TOXASSSELUR UDS interpretation: Compliant          Medication Assessment Form: Reviewed. Patient indicates being compliant with therapy Treatment compliance: Compliant Risk Assessment Profile: Aberrant behavior: See prior evaluations. None observed or detected today Comorbid factors increasing risk of overdose: See prior notes. No additional risks detected today Risk of substance use disorder (SUD): Low Opioid Risk Tool (ORT) Total Score:    Interpretation Table:  Score <3 = Low Risk for SUD  Score between 4-7 = Moderate Risk for SUD  Score >8 = High Risk for Opioid Abuse   Risk Mitigation Strategies:  Patient Counseling: Covered Patient-Prescriber Agreement (PPA): Present and active  Notification to other healthcare providers: Done  Pharmacologic Plan: No change in therapy, at this time  Post-Procedure Assessment  12/27/2015 Procedure: Diagnostic right-sided cervical epidural steroid injection #1 under fluoroscopic guidance and IV sedation Post-procedure pain score: 0/10 (100% relief) Influential Factors: BMI: 25.33 kg/m Intra-procedural challenges: None observed Assessment challenges: None detected         Post-procedural side-effects, adverse reactions, or complications: None  reported Reported issues: None  Sedation: Sedation provided. When no sedatives are used, the analgesic levels obtained are directly associated to the effectiveness of the local anesthetics. However, when sedation is provided, the level of analgesia obtained during the initial 1 hour following the intervention, is believed to be the result of a combination of factors. These factors may include, but are not limited to: 1. The effectiveness of the local anesthetics used. 2. The effects of the analgesic(s) and/or anxiolytic(s) used. 3. The degree of discomfort experienced by the patient at the time of the procedure. 4. The patients ability and reliability in recalling and recording the events. 5. The presence and influence of possible secondary gains and/or psychosocial factors. Reported result: Relief experienced during the 1st hour after the procedure: 100 % (Ultra-Short Term Relief) Interpretative annotation: Analgesia during this period is likely to be Local Anesthetic and/or IV Sedative (Analgesic/Anxiolitic) related.          Effects of local anesthetic: The analgesic effects attained during this period are directly associated to the localized infiltration of local anesthetics and therefore cary significant diagnostic value as to the etiological location, or anatomical origin, of the pain. Expected duration of relief is directly dependent on the pharmacodynamics of the local anesthetic used. Long-acting (4-6 hours) anesthetics used.  Reported result: Relief during the next 4 to 6 hour after the procedure: 100 % (Short-Term Relief) Interpretative annotation: Complete relief would suggest area to be the source of the pain.          Long-term benefit: Defined as the period of time past the expected duration of local anesthetics. With the possible exception of prolonged sympathetic blockade from the local anesthetics, benefits during this period are typically attributed to, or associated with, other  factors such as analgesic sensory neuropraxia, antiinflammatory effects, or beneficial biochemical changes provided by agents other than the local anesthetics Reported result: Extended relief following procedure: 75 % (hurting bad for the last two weeks) (Long-Term Relief) Interpretative annotation: Good relief. This could suggest inflammation  to be a significant component in the etiology to the pain.          Current benefits: Defined as persistent relief that continues at this point in time.   Reported results: Treated area: >50 % Ms. Loyer reports improvement in function Interpretative annotation: Ongoing benefits would suggest effective therapeutic approach  Interpretation: Results would suggest that repeating the procedure may be necessary, for therapeutic reasons  Laboratory Chemistry  Inflammation Markers Lab Results  Component Value Date   ESRSEDRATE 8 11/23/2015   CRP <0.8 11/23/2015   Renal Function Lab Results  Component Value Date   BUN 10 11/23/2015   CREATININE 0.67 11/23/2015   GFRAA >60 11/23/2015   GFRNONAA >60 11/23/2015   Hepatic Function Lab Results  Component Value Date   AST 23 11/23/2015   ALT 15 11/23/2015   ALBUMIN 4.6 11/23/2015   Electrolytes Lab Results  Component Value Date   NA 138 11/23/2015   K 4.0 11/23/2015   CL 102 11/23/2015   CALCIUM 9.5 11/23/2015   MG 2.1 11/23/2015   Pain Modulating Vitamins Lab Results  Component Value Date   25OHVITD1 28 (L) 11/23/2015   25OHVITD2 <1.0 11/23/2015   25OHVITD3 28 11/23/2015   VITAMINB12 318 11/23/2015   Coagulation Parameters No results found for: INR, LABPROT, APTT, PLT Cardiovascular No results found for: BNP, HGB, HCT Note: Lab results reviewed.  Recent Diagnostic Imaging Review  Dg C-arm 1-60 Min-no Report  Result Date: 12/27/2015 There is no report for this exam.  Note: Imaging results reviewed.          Meds  The patient has a current medication list which includes the  following prescription(s): vitamin d3, cyclobenzaprine, duloxetine, gabapentin, ibuprofen, meloxicam, mometasone, pimecrolimus, and hydrocodone-acetaminophen.  Current Outpatient Prescriptions on File Prior to Visit  Medication Sig  . Cholecalciferol (VITAMIN D3) 2000 units capsule Take 1 capsule (2,000 Units total) by mouth daily.  . DULoxetine (CYMBALTA) 30 MG capsule TAKE 1 CAPSULE(30 MG) BY MOUTH EVERY DAY  . ibuprofen (ADVIL,MOTRIN) 200 MG tablet Take 400 mg by mouth as needed.  . mometasone (ELOCON) 0.1 % cream Apply 1 application topically daily.  . pimecrolimus (ELIDEL) 1 % cream Apply 1 application topically 2 (two) times daily.   No current facility-administered medications on file prior to visit.    ROS  Constitutional: Denies any fever or chills Gastrointestinal: No reported hemesis, hematochezia, vomiting, or acute GI distress Musculoskeletal: Denies any acute onset joint swelling, redness, loss of ROM, or weakness Neurological: No reported episodes of acute onset apraxia, aphasia, dysarthria, agnosia, amnesia, paralysis, loss of coordination, or loss of consciousness  Allergies  Ms. Onofrio is allergic to methocarbamol; trazodone and nefazodone; bupropion; oxycodone-acetaminophen; and tramadol.  [place]  Drug: Ms. Pixley  reports that she does not use drugs. Alcohol:  reports that she does not drink alcohol. Tobacco:  reports that she has been smoking Cigarettes.  She has been smoking about 0.50 packs per day. She has never used smokeless tobacco. Medical:  has a past medical history of Anxiety; Depression; Hyperlipidemia; and Lupus. Family: family history includes Cancer in her mother; Diabetes in her brother; Emphysema in her mother; Glaucoma in her father; Heart disease in her father; Hypertension in her mother; Stroke in her mother.  Past Surgical History:  Procedure Laterality Date  . ABDOMINAL HYSTERECTOMY     Constitutional Exam  General appearance: Well  nourished, well developed, and well hydrated. In no apparent acute distress Vitals:   02/06/16 1351  BP: (!) 160/79  Pulse: 90  Resp: 18  Temp: 97.7 F (36.5 C)  TempSrc: Oral  SpO2: 100%  Weight: 143 lb (64.9 kg)  Height: 5' 3" (1.6 m)   BMI Assessment: Estimated body mass index is 25.33 kg/m as calculated from the following:   Height as of this encounter: 5' 3" (1.6 m).   Weight as of this encounter: 143 lb (64.9 kg).  BMI interpretation table: BMI level Category Range association with higher incidence of chronic pain  <18 kg/m2 Underweight   18.5-24.9 kg/m2 Ideal body weight   25-29.9 kg/m2 Overweight Increased incidence by 20%  30-34.9 kg/m2 Obese (Class I) Increased incidence by 68%  35-39.9 kg/m2 Severe obesity (Class II) Increased incidence by 136%  >40 kg/m2 Extreme obesity (Class III) Increased incidence by 254%   BMI Readings from Last 4 Encounters:  02/06/16 25.33 kg/m  12/27/15 26.57 kg/m  12/21/15 26.57 kg/m  11/23/15 26.52 kg/m   Wt Readings from Last 4 Encounters:  02/06/16 143 lb (64.9 kg)  12/27/15 150 lb (68 kg)  12/21/15 150 lb (68 kg)  11/23/15 145 lb (65.8 kg)  Psych/Mental status: Alert, oriented x 3 (person, place, & time) Eyes: PERLA Respiratory: No evidence of acute respiratory distress  Cervical Spine Exam  Inspection: No masses, redness, or swelling Alignment: Symmetrical Functional ROM: Unrestricted ROM Stability: No instability detected Muscle strength & Tone: Functionally intact Sensory: Unimpaired Palpation: Non-contributory  Upper Extremity (UE) Exam    Side: Right upper extremity  Side: Left upper extremity  Inspection: No masses, redness, swelling, or asymmetry  Inspection: No masses, redness, swelling, or asymmetry  Functional ROM: Unrestricted ROM          Functional ROM: Unrestricted ROM          Muscle strength & Tone: Functionally intact  Muscle strength & Tone: Functionally intact  Sensory: Unimpaired  Sensory:  Unimpaired  Palpation: Non-contributory  Palpation: Non-contributory   Thoracic Spine Exam  Inspection: No masses, redness, or swelling Alignment: Symmetrical Functional ROM: Unrestricted ROM Stability: No instability detected Sensory: Unimpaired Muscle strength & Tone: Functionally intact Palpation: Non-contributory  Lumbar Spine Exam  Inspection: No masses, redness, or swelling Alignment: Symmetrical Functional ROM: Unrestricted ROM Stability: No instability detected Muscle strength & Tone: Functionally intact Sensory: Unimpaired Palpation: Non-contributory Provocative Tests: Lumbar Hyperextension and rotation test: evaluation deferred today       Patrick's Maneuver: evaluation deferred today              Gait & Posture Assessment  Ambulation: Unassisted Gait: Relatively normal for age and body habitus Posture: WNL   Lower Extremity Exam    Side: Right lower extremity  Side: Left lower extremity  Inspection: No masses, redness, swelling, or asymmetry  Inspection: No masses, redness, swelling, or asymmetry  Functional ROM: Unrestricted ROM          Functional ROM: Unrestricted ROM          Muscle strength & Tone: Functionally intact  Muscle strength & Tone: Functionally intact  Sensory: Unimpaired  Sensory: Unimpaired  Palpation: Non-contributory  Palpation: Non-contributory   Assessment  Primary Diagnosis & Pertinent Problem List: The primary encounter diagnosis was Chronic pain syndrome. Diagnoses of Fibromyalgia, Chronic shoulder pain (Location of Primary Source of Pain) (Bilateral) (R>L), Chronic neck pain (Location of Secondary source of pain) (Bilateral) (R>L), Chronic upper back pain (Location of Tertiary source of pain) (Bilateral) (R>L), Long term prescription opiate use, Opiate use (80 MME/Day), Generalized anxiety disorder, Recurrent major depressive  disorder, remission status unspecified ([place]), and Chronic cervical radicular pain (Right) were also pertinent to this  visit.  Status Diagnosis  Controlled Controlled Controlled 1. Chronic pain syndrome   2. Fibromyalgia   3. Chronic shoulder pain (Location of Primary Source of Pain) (Bilateral) (R>L)   4. Chronic neck pain (Location of Secondary source of pain) (Bilateral) (R>L)   5. Chronic upper back pain (Location of Tertiary source of pain) (Bilateral) (R>L)   6. Long term prescription opiate use   7. Opiate use (80 MME/Day)   8. Generalized anxiety disorder   9. Recurrent major depressive disorder, remission status unspecified ([place])   10. Chronic cervical radicular pain (Right)      Plan of Care  Pharmacotherapy (Medications Ordered): Meds ordered this encounter  Medications  . meloxicam (MOBIC) 15 MG tablet    Sig: Take 1 tablet (15 mg total) by mouth daily.    Dispense:  30 tablet    Refill:  2    Do not add this medication to the electronic "Automatic Refill" notification system. Patient may have prescription filled one day early if pharmacy is closed on scheduled refill date.  Marland Kitchen HYDROcodone-acetaminophen (NORCO/VICODIN) 5-325 MG tablet    Sig: Take 1 tablet by mouth 2 (two) times daily.    Dispense:  60 tablet    Refill:  0    Do not place this medication, or any other prescription from our practice, on "Automatic Refill". Patient may have prescription filled one day early if pharmacy is closed on scheduled refill date. Do not fill until: 02/06/16 To last until: 03/07/16  . gabapentin (NEURONTIN) 300 MG capsule    Sig: Take 1 capsule (300 mg total) by mouth 3 (three) times daily.    Dispense:  90 capsule    Refill:  2    Do not add this medication to the electronic "Automatic Refill" notification system. Patient may have prescription filled one day early if pharmacy is closed on scheduled refill date.  . cyclobenzaprine (FLEXERIL) 10 MG tablet    Sig: Take 1 tablet (10 mg total) by mouth 3 (three) times daily as needed for muscle spasms.    Dispense:  90 tablet    Refill:  2    Do  not add this medication to the electronic "Automatic Refill" notification system. Patient may have prescription filled one day early if pharmacy is closed on scheduled refill date.   New Prescriptions   No medications on file   Medications administered today: Ms. Coppinger had no medications administered during this visit. Lab-work, procedure(s), and/or referral(s): Orders Placed This Encounter  Procedures  . Cervical Epidural Injection  . Cervical Epidural Injection  . Ambulatory referral to Psychiatry  . Ambulatory referral to Psychology   Imaging and/or referral(s): AMB REFERRAL TO PSYCHIATRY AMB REFERRAL TO PSYCHOLOGY  Interventional therapies: Planned, scheduled, and/or pending:   Therapeutic right-sided cervical epidural steroid injection #2 under fluoroscopic guidance and IV sedation   Considering:   Diagnostic bilateral intra-articular shoulder joint injection  Diagnostic bilateral suprascapular nerve block  Possible bilateral suprascapular nerve RFA  right-sided cervical epidural steroid injection #1 under fluoroscopic guidance and IV sedation Diagnostic bilateral cervical facet block possible bilateral cervical facet radiofrequency ablation  Diagnostic bilateral lumbar facet block  Possible bilateral lumbar facet radiofrequency ablation    Palliative PRN treatment(s):   Palliative right-sided cervical epidural steroid injection under fluoroscopic guidance and IV sedation   Provider-requested follow-up: Return in about 1 month (around 03/08/2016) for (MD) Med-Mgmt, in  addition, procedure (ASAP).  Future Appointments Date Time Provider [place]  02/12/2016 8:45 AM Milinda Pointer, MD [facility] None  03/07/2016 11:30 AM Milinda Pointer, MD [facility] None   Primary Care Physician: Ricardo Jericho, NP Location: [facility] Outpatient Pain Management Facility Note by: Kathlen Brunswick. Dossie Arbour, M.D, DABA, DABAPM, DABPM, DABIPP, FIPP Date: 02/06/2016; Time: 3:28  PM  Pain Score Disclaimer: We use the NRS-11 scale. This is a self-reported, subjective measurement of pain severity with only modest accuracy. It is used primarily to identify changes within a particular patient. It must be understood that outpatient pain scales are significantly less accurate that those used for research, where they can be applied under ideal controlled circumstances with minimal exposure to variables. In reality, the score is likely to be a combination of pain intensity and pain affect, where pain affect describes the degree of emotional arousal or changes in action readiness caused by the sensory experience of pain. Factors such as social and work situation, setting, emotional state, anxiety levels, expectation, and prior pain experience may influence pain perception and show large inter-individual differences that may also be affected by time variables.  Patient instructions provided during this appointment: Patient Instructions  Epidural Steroid Injection Patient Information  Description: The epidural space surrounds the nerves as they exit the spinal cord.  In some patients, the nerves can be compressed and inflamed by a bulging disc or a tight spinal canal (spinal stenosis).  By injecting steroids into the epidural space, we can bring irritated nerves into direct contact with a potentially helpful medication.  These steroids act directly on the irritated nerves and can reduce swelling and inflammation which often leads to decreased pain.  Epidural steroids may be injected anywhere along the spine and from the neck to the low back depending upon the location of your pain.   After numbing the skin with local anesthetic (like Novocaine), a small needle is passed into the epidural space slowly.  You may experience a sensation of pressure while this is being done.  The entire block usually last less than 10 minutes.  Conditions which may be treated by epidural steroids:   Low back  and leg pain  Neck and arm pain  Spinal stenosis  Post-laminectomy syndrome  Herpes zoster (shingles) pain  Pain from compression fractures  Preparation for the injection:  1. Do not eat any solid food or dairy products within 8 hours of your appointment.  2. You may drink clear liquids up to 3 hours before appointment.  Clear liquids include water, black coffee, juice or soda.  No milk or cream please. 3. You may take your regular medication, including pain medications, with a sip of water before your appointment  Diabetics should hold regular insulin (if taken separately) and take 1/2 normal NPH dos the morning of the procedure.  Carry some sugar containing items with you to your appointment. 4. A driver must accompany you and be prepared to drive you home after your procedure.  5. Bring all your current medications with your. 6. An IV may be inserted and sedation may be given at the discretion of the physician.   7. A blood pressure cuff, EKG and other monitors will often be applied during the procedure.  Some patients may need to have extra oxygen administered for a short period. 8. You will be asked to provide medical information, including your allergies, prior to the procedure.  We must know immediately if you are taking blood thinners (like Coumadin/Warfarin)  Or if you are allergic to IV iodine contrast (dye). We must know if you could possible be pregnant.  Possible side-effects:  Bleeding from needle site  Infection (rare, may require surgery)  Nerve injury (rare)  Numbness & tingling (temporary)  Difficulty urinating (rare, temporary)  Spinal headache ( a headache worse with upright posture)  Light -headedness (temporary)  Pain at injection site (several days)  Decreased blood pressure (temporary)  Weakness in arm/leg (temporary)  Pressure sensation in back/neck (temporary)  Call if you experience:  Fever/chills associated with headache or increased  back/neck pain.  Headache worsened by an upright position.  New onset weakness or numbness of an extremity below the injection site  Hives or difficulty breathing (go to the emergency room)  Inflammation or drainage at the infection site  Severe back/neck pain  Any new symptoms which are concerning to you  Please note:  Although the local anesthetic injected can often make your back or neck feel good for several hours after the injection, the pain will likely return.  It takes 3-7 days for steroids to work in the epidural space.  You may not notice any pain relief for at least that one week.  If effective, we will often do a series of three injections spaced 3-6 weeks apart to maximally decrease your pain.  After the initial series, we generally will wait several months before considering a repeat injection of the same type.  If you have any questions, please call [phone] [facility]   Hydrocodone prescription given to last until 03-07-2016

## 2016-02-06 NOTE — Progress Notes (Signed)
Nursing Pain Medication Assessment:  Safety precautions to be maintained throughout the outpatient stay will include: orient to surroundings, keep bed in low position, maintain call bell within reach at all times, provide assistance with transfer out of bed and ambulation.  Medication Inspection Compliance: Pill count conducted under aseptic conditions, in front of the patient. Neither the pills nor the bottle was removed from the patient's sight at any time. Once count was completed pills were immediately returned to the patient in their original bottle.  Medication: Hydrocodone/APAP Pill Count: 0 of 60 pills remain Bottle Appearance: Standard pharmacy container. Clearly labeled. Filled Date: 12 /01 / 2017 Medication last intake:02-06-16 at 1000  Requesting psych referral

## 2016-02-06 NOTE — Patient Instructions (Addendum)
Epidural Steroid Injection Patient Information  Description: The epidural space surrounds the nerves as they exit the spinal cord.  In some patients, the nerves can be compressed and inflamed by a bulging disc or a tight spinal canal (spinal stenosis).  By injecting steroids into the epidural space, we can bring irritated nerves into direct contact with a potentially helpful medication.  These steroids act directly on the irritated nerves and can reduce swelling and inflammation which often leads to decreased pain.  Epidural steroids may be injected anywhere along the spine and from the neck to the low back depending upon the location of your pain.   After numbing the skin with local anesthetic (like Novocaine), a small needle is passed into the epidural space slowly.  You may experience a sensation of pressure while this is being done.  The entire block usually last less than 10 minutes.  Conditions which may be treated by epidural steroids:   Low back and leg pain  Neck and arm pain  Spinal stenosis  Post-laminectomy syndrome  Herpes zoster (shingles) pain  Pain from compression fractures  Preparation for the injection:  1. Do not eat any solid food or dairy products within 8 hours of your appointment.  2. You may drink clear liquids up to 3 hours before appointment.  Clear liquids include water, black coffee, juice or soda.  No milk or cream please. 3. You may take your regular medication, including pain medications, with a sip of water before your appointment  Diabetics should hold regular insulin (if taken separately) and take 1/2 normal NPH dos the morning of the procedure.  Carry some sugar containing items with you to your appointment. 4. A driver must accompany you and be prepared to drive you home after your procedure.  5. Bring all your current medications with your. 6. An IV may be inserted and sedation may be given at the discretion of the physician.   7. A blood pressure  cuff, EKG and other monitors will often be applied during the procedure.  Some patients may need to have extra oxygen administered for a short period. 8. You will be asked to provide medical information, including your allergies, prior to the procedure.  We must know immediately if you are taking blood thinners (like Coumadin/Warfarin)  Or if you are allergic to IV iodine contrast (dye). We must know if you could possible be pregnant.  Possible side-effects:  Bleeding from needle site  Infection (rare, may require surgery)  Nerve injury (rare)  Numbness & tingling (temporary)  Difficulty urinating (rare, temporary)  Spinal headache ( a headache worse with upright posture)  Light -headedness (temporary)  Pain at injection site (several days)  Decreased blood pressure (temporary)  Weakness in arm/leg (temporary)  Pressure sensation in back/neck (temporary)  Call if you experience:  Fever/chills associated with headache or increased back/neck pain.  Headache worsened by an upright position.  New onset weakness or numbness of an extremity below the injection site  Hives or difficulty breathing (go to the emergency room)  Inflammation or drainage at the infection site  Severe back/neck pain  Any new symptoms which are concerning to you  Please note:  Although the local anesthetic injected can often make your back or neck feel good for several hours after the injection, the pain will likely return.  It takes 3-7 days for steroids to work in the epidural space.  You may not notice any pain relief for at least that one week.    If effective, we will often do a series of three injections spaced 3-6 weeks apart to maximally decrease your pain.  After the initial series, we generally will wait several months before considering a repeat injection of the same type.  If you have any questions, please call [phone] [facility]   Hydrocodone  prescription given to last until 03-07-2016

## 2016-02-12 ENCOUNTER — Other Ambulatory Visit: Payer: Self-pay | Admitting: Pain Medicine

## 2016-02-12 ENCOUNTER — Ambulatory Visit
Admission: RE | Admit: 2016-02-12 | Discharge: 2016-02-12 | Disposition: A | Discharge status: Home / Self Care | Payer: Medicaid Other | Source: Ambulatory Visit | Attending: Pain Medicine | Admitting: Pain Medicine

## 2016-02-12 ENCOUNTER — Ambulatory Visit (HOSPITAL_BASED_OUTPATIENT_CLINIC_OR_DEPARTMENT_OTHER): Payer: Medicaid Other | Admitting: Pain Medicine

## 2016-02-12 ENCOUNTER — Encounter: Payer: Self-pay | Admitting: Pain Medicine

## 2016-02-12 VITALS — BP 130/86 | HR 77 | Temp 96.9°F | Resp 16 | Ht 63.0 in | Wt 145.0 lb

## 2016-02-12 DIAGNOSIS — M5412 Radiculopathy, cervical region: Secondary | ICD-10-CM

## 2016-02-12 DIAGNOSIS — Z79899 Other long term (current) drug therapy: Secondary | ICD-10-CM | POA: Insufficient documentation

## 2016-02-12 DIAGNOSIS — G8929 Other chronic pain: Secondary | ICD-10-CM | POA: Diagnosis not present

## 2016-02-12 DIAGNOSIS — F17210 Nicotine dependence, cigarettes, uncomplicated: Secondary | ICD-10-CM | POA: Diagnosis not present

## 2016-02-12 DIAGNOSIS — R52 Pain, unspecified: Secondary | ICD-10-CM | POA: Diagnosis present

## 2016-02-12 MED ORDER — IOPAMIDOL (ISOVUE-M 200) INJECTION 41%
10.0000 mL | Freq: Once | INTRAMUSCULAR | Status: AC
  Administered 2016-02-12: 10 mL via EPIDURAL
  Filled 2016-02-12: qty 10

## 2016-02-12 MED ORDER — LACTATED RINGERS IV SOLN
1000.0000 mL | Freq: Once | INTRAVENOUS | Status: AC
  Administered 2016-02-12: 1000 mL via INTRAVENOUS

## 2016-02-12 MED ORDER — DEXAMETHASONE SODIUM PHOSPHATE 4 MG/ML IJ SOLN
INTRAMUSCULAR | Status: AC
  Filled 2016-02-12: qty 3

## 2016-02-12 MED ORDER — FENTANYL CITRATE (PF) 100 MCG/2ML IJ SOLN
25.0000 ug | INTRAMUSCULAR | Status: DC | PRN
Start: 2016-02-12 — End: 2016-03-07
  Administered 2016-02-12: 50 ug via INTRAVENOUS
  Filled 2016-02-12: qty 2

## 2016-02-12 MED ORDER — DEXAMETHASONE SODIUM PHOSPHATE 10 MG/ML IJ SOLN
10.0000 mg | Freq: Once | INTRAMUSCULAR | Status: AC
  Administered 2016-02-12: 10 mg
  Filled 2016-02-12: qty 1

## 2016-02-12 MED ORDER — SODIUM CHLORIDE 0.9% FLUSH
1.0000 mL | Freq: Once | INTRAVENOUS | Status: AC
  Administered 2016-02-12: 1 mL

## 2016-02-12 MED ORDER — LIDOCAINE HCL (PF) 1 % IJ SOLN
10.0000 mL | Freq: Once | INTRAMUSCULAR | Status: AC
  Administered 2016-02-12: 10 mL
  Filled 2016-02-12: qty 10

## 2016-02-12 MED ORDER — MIDAZOLAM HCL 5 MG/5ML IJ SOLN
1.0000 mg | INTRAMUSCULAR | Status: DC | PRN
  Administered 2016-02-12: 3 mg via INTRAVENOUS
  Filled 2016-02-12: qty 5

## 2016-02-12 MED ORDER — ROPIVACAINE HCL 2 MG/ML IJ SOLN
1.0000 mL | Freq: Once | INTRAMUSCULAR | Status: AC
Start: 2016-02-12 — End: 2016-02-12
  Administered 2016-02-12: 1 mL via EPIDURAL
  Filled 2016-02-12: qty 10

## 2016-02-12 NOTE — Progress Notes (Signed)
Patient's Name: Tanya [name]  MRN: [id number]  Referring Provider: Milinda Pointer, MD  DOB: [birth year]  PCP: Ricardo Jericho, NP  DOS: 02/12/2016  Note by: Kathlen Brunswick. Dossie Arbour, MD  Service setting: Ambulatory outpatient  Location: [facility] (AMB) Pain Management Facility  Visit type: Procedure  Specialty: Interventional Pain Management  Patient type: Established   Primary Reason for Visit: Interventional Pain Management Treatment. CC: Neck Pain  Procedure:  Anesthesia, Analgesia, Anxiolysis:  Type: Therapeutic, Inter-Laminar, Epidural Steroid Injection Region: Posterior Cervico-thoracic Region Level: C7-T1 Laterality: Right-Sided Paramedial  Type: Local Anesthesia with Moderate (Conscious) Sedation Local Anesthetic: Lidocaine 1% Route: Intravenous (IV) IV Access: Secured Sedation: Meaningful verbal contact was maintained at all times during the procedure  Indication(s): Analgesia and Anxiety  Indications: 1. Chronic cervical radicular pain (Right)    Pain Score: Pre-procedure: 3 /10 Post-procedure: 0-No pain/10  Pre-op Assessment:  Previous date of service: 02/06/16 Service provided: Med Refill Ms. Gowen is a 56 y.o. (year old), female patient, seen today for interventional treatment. She  has a past surgical history that includes Abdominal hysterectomy. Her primarily concern today is the Neck Pain  Initial Vital Signs: Blood pressure 123/74, pulse 84, temperature (!) 96.2 F (35.7 C), resp. rate 18, height 5\' 3"  (1.6 m), weight 145 lb (65.8 kg), SpO2 99 %. BMI: 25.69 kg/m  Risk Assessment: Allergies: Reviewed. She is allergic to methocarbamol; trazodone and nefazodone; bupropion; oxycodone-acetaminophen; and tramadol. Allergy Precautions: None required Coagulopathies: "Reviewed. None identified.  Blood-thinner therapy: None at this time Active Infection(s): Reviewed. None identified. Ms. Stockman is afebrile  Site Confirmation: Ms. Fejes was asked to  confirm the procedure and laterality before marking the site Procedure checklist: Completed Consent: Before the procedure and under the influence of no sedative(s), amnesic(s), or anxiolytics, the patient was informed of the treatment options, risks and possible complications. To fulfill our ethical and legal obligations, as recommended by the American Medical Association's Code of Ethics, I have informed the patient of my clinical impression; the nature and purpose of the treatment or procedure; the risks, benefits, and possible complications of the intervention; the alternatives, including doing nothing; the risk(s) and benefit(s) of the alternative treatment(s) or procedure(s); and the risk(s) and benefit(s) of doing nothing. The patient was provided information about the general risks and possible complications associated with the procedure. These may include, but are not limited to: failure to achieve desired goals, infection, bleeding, organ or nerve damage, allergic reactions, paralysis, and death. In addition, the patient was informed of those risks and complications associated to Spine-related procedures, such as failure to decrease pain; infection (i.e.: Meningitis, epidural or intraspinal abscess); bleeding (i.e.: epidural hematoma, subarachnoid hemorrhage, or any other type of intraspinal or peri-dural bleeding); organ or nerve damage (i.e.: Any type of peripheral nerve, nerve root, or spinal cord injury) with subsequent damage to sensory, motor, and/or autonomic systems, resulting in permanent pain, numbness, and/or weakness of one or several areas of the body; allergic reactions; (i.e.: anaphylactic reaction); and/or death. Furthermore, the patient was informed of those risks and complications associated with the medications. These include, but are not limited to: allergic reactions (i.e.: anaphylactic or anaphylactoid reaction(s)); adrenal axis suppression; blood sugar elevation that in diabetics  may result in ketoacidosis or comma; water retention that in patients with history of congestive heart failure may result in shortness of breath, pulmonary edema, and decompensation with resultant heart failure; weight gain; swelling or edema; medication-induced neural toxicity; particulate matter embolism and blood vessel occlusion with resultant organ,  and/or nervous system infarction; and/or aseptic necrosis of one or more joints. Finally, the patient was informed that Medicine is not an exact science; therefore, there is also the possibility of unforeseen or unpredictable risks and/or possible complications that may result in a catastrophic outcome. The patient indicated having understood very clearly. We have given the patient no guarantees and we have made no promises. Enough time was given to the patient to ask questions, all of which were answered to the patient's satisfaction. Ms. Hemker has indicated that she wanted to continue with the procedure. Attestation: I, the ordering provider, attest that I have discussed with the patient the benefits, risks, side-effects, alternatives, likelihood of achieving goals, and potential problems during recovery for the procedure that I have provided informed consent. Date: 02/12/2016; Time: 9:39 AM  Pre-Procedure Preparation:  Monitoring: As per clinic protocol. Respiration, ETCO2, SpO2, BP, heart rate and rhythm monitor placed and checked for adequate function Safety Precautions: Patient was assessed for positional comfort and pressure points before starting the procedure. Time-out: I initiated and conducted the "Time-out" before starting the procedure, as per protocol. The patient was asked to participate by confirming the accuracy of the "Time Out" information. Verification of the correct person, site, and procedure were performed and confirmed by me, the nursing staff, and the patient. "Time-out" conducted as per Joint Commission's Universal Protocol  (UP.01.01.01). "Time-out" Date & Time: 02/12/2016; 1019 hrs.  Description of Procedure Process:   Position: Prone with head of the table was raised to facilitate breathing. Target Area: For Epidural Steroid injections the target is the interlaminar space, initially targeting the lower border of the superior vertebral body lamina. Approach: Paramedial approach. Area Prepped: Entire PosteriorCervical Region Prepping solution: ChloraPrep (2% chlorhexidine gluconate and 70% isopropyl alcohol) Safety Precautions: Aspiration looking for blood return was conducted prior to all injections. At no point did we inject any substances, as a needle was being advanced. No attempts were made at seeking any paresthesias. Safe injection practices and needle disposal techniques used. Medications properly checked for expiration dates. SDV (single dose vial) medications used. Description of the Procedure: Protocol guidelines were followed. The procedure needle was introduced through the skin, ipsilateral to the reported pain, and advanced to the target area. Bone was contacted and the needle walked caudad, until the lamina was cleared. The epidural space was identified using "loss-of-resistance technique" with 2-3 ml of PF-NaCl (0.9% NSS), in a 5cc LOR glass syringe. Start Time: 1020 hrs. End Time: 1025 hrs. Materials:  Needle(s) Type: Epidural needle Gauge: 22G Length: 3.5-in Medication(s): We administered fentaNYL, lactated ringers, midazolam, dexamethasone, iopamidol, lidocaine (PF), sodium chloride flush, and ropivacaine (PF) 2 mg/mL (0.2%). Please see chart orders for dosing details.  Imaging Guidance (Spinal):  Type of Imaging Technique: Fluoroscopy Guidance (Spinal) Indication(s): Assistance in needle guidance and placement for procedures requiring needle placement in or near specific anatomical locations not easily accessible without such assistance. Exposure Time: Please see nurses notes. Contrast: Before  injecting any contrast, we confirmed that the patient did not have an allergy to iodine, shellfish, or radiological contrast. Once satisfactory needle placement was completed at the desired level, radiological contrast was injected. Contrast injected under live fluoroscopy. No contrast complications. See chart for type and volume of contrast used. Fluoroscopic Guidance: I was personally present during the use of fluoroscopy. "Tunnel Vision Technique" used to obtain the best possible view of the target area. Parallax error corrected before commencing the procedure. "Direction-depth-direction" technique used to introduce the needle under continuous pulsed  fluoroscopy. Once target was reached, antero-posterior, oblique, and lateral fluoroscopic projection used confirm needle placement in all planes. Images permanently stored in EMR. Interpretation: I personally interpreted the imaging intraoperatively. Adequate needle placement confirmed in multiple planes. Appropriate spread of contrast into desired area was observed. No evidence of afferent or efferent intravascular uptake. No intrathecal or subarachnoid spread observed. Permanent images saved into the patient's record.  Antibiotic Prophylaxis:  Indication(s): None identified Antibiotic given: None  Post-operative Assessment:  EBL: None Complications: No immediate post-treatment complications observed by team, or reported by patient. Note: The patient tolerated the entire procedure well. A repeat set of vitals were taken after the procedure and the patient was kept under observation following institutional policy, for this type of procedure. Post-procedural neurological assessment was performed, showing return to baseline, prior to discharge. The patient was provided with post-procedure discharge instructions, including a section on how to identify potential problems. Should any problems arise concerning this procedure, the patient was given instructions  to immediately contact us, at any time, without hesitation. In any case, we plan to contact the patient by telephone for a follow-up status report regarding this interventional procedure. Comments:  No additional relevant information.  Plan of Care  Disposition: Discharge home  Discharge Date & Time: 02/12/2016; 1058 hrs.  Physician-requested Follow-up:  Return in about 2 weeks (around 02/26/2016) for Post-Procedure evaluation.  Future Appointments Date Time Provider [place]  03/07/2016 11:30 AM Milinda Pointer, MD [facility] None   Medications ordered for procedure: Meds ordered this encounter  Medications  . fentaNYL (SUBLIMAZE) injection 25-50 mcg    Make sure Narcan is available in the pyxis when using this medication. In the event of respiratory depression (RR< 8/min): Titrate NARCAN (naloxone) in increments of 0.1 to 0.2 mg IV at 2-3 minute intervals, until desired degree of reversal.  . lactated ringers infusion 1,000 mL  . midazolam (VERSED) 5 MG/5ML injection 1-2 mg    Make sure Flumazenil is available in the pyxis when using this medication. If oversedation occurs, administer 0.2 mg IV over 15 sec. If after 45 sec no response, administer 0.2 mg again over 1 min; may repeat at 1 min intervals; not to exceed 4 doses (1 mg)  . dexamethasone (DECADRON) injection 10 mg  . iopamidol (ISOVUE-M) 41 % intrathecal injection 10 mL  . lidocaine (PF) (XYLOCAINE) 1 % injection 10 mL  . sodium chloride flush (NS) 0.9 % injection 1 mL  . ropivacaine (PF) 2 mg/mL (0.2%) (NAROPIN) injection 1 mL  . dexamethasone (DECADRON) 4 MG/ML injection    Willeen Cass L: cabinet override   Medications administered: We administered fentaNYL, lactated ringers, midazolam, dexamethasone, iopamidol, lidocaine (PF), sodium chloride flush, and ropivacaine (PF) 2 mg/mL (0.2%).  See the medical record for exact dosing, route, and time of administration.  Lab-work, Procedure(s), & Referral(s)  Ordered: Orders Placed This Encounter  Procedures  . Discharge instructions  . Follow-up  . Informed Consent Details: Transcribe to consent form and obtain patient signature  . Provider attestation of informed consent for procedure/surgical case  . Verify informed consent   Imaging Ordered: Results for orders placed in visit on 12/27/15  DG C-Arm 1-60 Min-No Report   Narrative There is no report for this exam.   New Prescriptions   No medications on file   Primary Care Physician: Ricardo Jericho, NP Location: [facility] Note by: Kathlen Brunswick. Dossie Arbour, M.D, DABA, DABAPM, DABPM, DABIPP, FIPP Date: 02/12/2016; Time: 12:21 PM  Disclaimer:  Medicine is not an Chief Strategy Officer. The only guarantee in medicine is that nothing is guaranteed. It is important to note that the decision to proceed with this intervention was based on the information collected from the patient. The Data and conclusions were drawn from the patient's questionnaire, the interview, and the physical examination. Because the information was provided in large part by the patient, it cannot be guaranteed that it has not been purposely or unconsciously manipulated. Every effort has been made to obtain as much relevant data as possible for this evaluation. It is important to note that the conclusions that lead to this procedure are derived in large part from the available data. Always take into account that the treatment will also be dependent on availability of resources and existing treatment guidelines, considered by other Pain Management Practitioners as being common knowledge and practice, at the time of the intervention. For Medico-Legal purposes, it is also important to point out that variation in procedural techniques and pharmacological choices are the acceptable norm. The indications, contraindications, technique, and results of the above procedure should only be interpreted and judged by a  Board-Certified Interventional Pain Specialist with extensive familiarity and expertise in the same exact procedure and technique. Attempts at providing opinions without similar or greater experience and expertise than that of the treating physician will be considered as inappropriate and unethical, and shall result in a formal complaint to the state medical board and applicable specialty societies.  Instructions provided at this appointment: Patient Instructions  Steps to Quit Smoking Smoking tobacco can be bad for your health. It can also affect almost every organ in your body. Smoking puts you and people around you at risk for many serious long-lasting (chronic) diseases. Quitting smoking is hard, but it is one of the best things that you can do for your health. It is never too late to quit. What are the benefits of quitting smoking? When you quit smoking, you lower your risk for getting serious diseases and conditions. They can include:  Lung cancer or lung disease.  Heart disease.  Stroke.  Heart attack.  Not being able to have children (infertility).  Weak bones (osteoporosis) and broken bones (fractures). If you have coughing, wheezing, and shortness of breath, those symptoms may get better when you quit. You may also get sick less often. If you are pregnant, quitting smoking can help to lower your chances of having a baby of low birth weight. What can I do to help me quit smoking? Talk with your doctor about what can help you quit smoking. Some things you can do (strategies) include:  Quitting smoking totally, instead of slowly cutting back how much you smoke over a period of time.  Going to in-person counseling. You are more likely to quit if you go to many counseling sessions.  Using resources and support systems, such as:  Online chats with a Social worker.  Phone quitlines.  Printed Furniture conservator/restorer.  Support groups or group counseling.  Text messaging  programs.  Mobile phone apps or applications.  Taking medicines. Some of these medicines may have nicotine in them. If you are pregnant or breastfeeding, do not take any medicines to quit smoking unless your doctor says it is okay. Talk with your doctor about counseling or other things that can help you. Talk with your doctor about using more than one strategy at the same time, such as taking medicines while you are also going to in-person counseling. This can help make quitting easier. What  things can I do to make it easier to quit? Quitting smoking might feel very hard at first, but there is a lot that you can do to make it easier. Take these steps:  Talk to your family and friends. Ask them to support and encourage you.  Call phone quitlines, reach out to support groups, or work with a Social worker.  Ask people who smoke to not smoke around you.  Avoid places that make you want (trigger) to smoke, such as:  Bars.  Parties.  Smoke-break areas at work.  Spend time with people who do not smoke.  Lower the stress in your life. Stress can make you want to smoke. Try these things to help your stress:  Getting regular exercise.  Deep-breathing exercises.  Yoga.  Meditating.  Doing a body scan. To do this, close your eyes, focus on one area of your body at a time from head to toe, and notice which parts of your body are tense. Try to relax the muscles in those areas.  Download or buy apps on your mobile phone or tablet that can help you stick to your quit plan. There are many free apps, such as QuitGuide from the [employer] Office manager for Disease Control and Prevention). You can find more support from smokefree.gov and other websites. This information is not intended to replace advice given to you by your health care provider. Make sure you discuss any questions you have with your health care provider. Document Released: 11/03/2008 Document Revised: 09/05/2015 Document Reviewed:  05/24/2014 Elsevier Interactive Patient Education  2017 [place]. Pain Management Discharge Instructions  General Discharge Instructions :  If you need to reach your doctor call: Monday-Friday 8:00 am - 4:00 pm at [phone] or toll free [phone].  After clinic hours [phone] to have operator reach doctor.  Bring all of your medication bottles to all your appointments in the pain clinic.  To cancel or reschedule your appointment with Pain Management please remember to call 24 hours in advance to avoid a fee.  Refer to the educational materials which you have been given on: General Risks, I had my Procedure. Discharge Instructions, Post Sedation.  Post Procedure Instructions:  The drugs you were given will stay in your system until tomorrow, so for the next 24 hours you should not drive, make any legal decisions or drink any alcoholic beverages.  You may eat anything you prefer, but it is better to start with liquids then soups and crackers, and gradually work up to solid foods.  Please notify your doctor immediately if you have any unusual bleeding, trouble breathing or pain that is not related to your normal pain.  Depending on the type of procedure that was done, some parts of your body may feel week and/or numb.  This usually clears up by tonight or the next day.  Walk with the use of an assistive device or accompanied by an adult for the 24 hours.  You may use ice on the affected area for the first 24 hours.  Put ice in a Ziploc bag and cover with a towel and place against area 15 minutes on 15 minutes off.  You may switch to heat after 24 hours. Epidural Steroid Injection An epidural steroid injection is a shot of steroid medicine and numbing medicine that is given into the space between the spinal cord and the bones in your back (epidural space). The shot helps relieve pain caused by an irritated or swollen nerve root. The amount of pain  relief you get from the  injection depends on what is causing the nerve to be swollen and irritated, and how long your pain lasts. You are more likely to benefit from this injection if your pain is strong and comes on suddenly rather than if you have had pain for a long time. Tell a health care provider about: Any allergies you have. All medicines you are taking, including vitamins, herbs, eye drops, creams, and over-the-counter medicines. Any problems you or family members have had with anesthetic medicines. Any blood disorders you have. Any surgeries you have had. Any medical conditions you have. Whether you are pregnant or may be pregnant. What are the risks? Generally, this is a safe procedure. However, problems may occur, including: Headache. Bleeding. Infection. Allergic reaction to medicines. Damage to your nerves. What happens before the procedure? Staying hydrated  Follow instructions from your health care provider about hydration, which may include: Up to 2 hours before the procedure - you may continue to drink clear liquids, such as water, clear fruit juice, black coffee, and plain tea. Eating and drinking restrictions  Follow instructions from your health care provider about eating and drinking, which may include: 8 hours before the procedure - stop eating heavy meals or foods such as meat, fried foods, or fatty foods. 6 hours before the procedure - stop eating light meals or foods, such as toast or cereal. 6 hours before the procedure - stop drinking milk or drinks that contain milk. 2 hours before the procedure - stop drinking clear liquids. Medicine You may be given medicines to lower anxiety. Ask your health care provider about: Changing or stopping your regular medicines. This is especially important if you are taking diabetes medicines or blood thinners. Taking medicines such as aspirin and ibuprofen. These medicines can thin your blood. Do not take these medicines before your procedure if  your health care provider instructs you not to. General instructions Plan to have someone take you home from the hospital or clinic. What happens during the procedure? You may receive a medicine to help you relax (sedative). You will be asked to lie on your abdomen. The injection site will be cleaned. A numbing medicine (local anesthetic) will be used to numb the injection site. A needle will be inserted through your skin into the epidural space. You may feel some discomfort when this happens. An X-ray machine will be used to make sure the needle is put as close as possible to the affected nerve. A steroid medicine and a local anesthetic will be injected into the epidural space. The needle will be removed. A bandage (dressing) will be put over the injection site. What happens after the procedure? Your blood pressure, heart rate, breathing rate, and blood oxygen level will be monitored until the medicines you were given have worn off. Your arm or leg may feel weak or numb for a few hours. The injection site may feel sore. Do not drive for 24 hours if you received a sedative. This information is not intended to replace advice given to you by your health care provider. Make sure you discuss any questions you have with your health care provider. Document Released: 04/16/2007 Document Revised: 06/21/2015 Document Reviewed: 04/25/2015 Elsevier Interactive Patient Education  2017 [employer].

## 2016-02-12 NOTE — Patient Instructions (Addendum)
Steps to Quit Smoking Smoking tobacco can be bad for your health. It can also affect almost every organ in your body. Smoking puts you and people around you at risk for many serious long-lasting (chronic) diseases. Quitting smoking is hard, but it is one of the best things that you can do for your health. It is never too late to quit. What are the benefits of quitting smoking? When you quit smoking, you lower your risk for getting serious diseases and conditions. They can include:  Lung cancer or lung disease.  Heart disease.  Stroke.  Heart attack.  Not being able to have children (infertility).  Weak bones (osteoporosis) and broken bones (fractures). If you have coughing, wheezing, and shortness of breath, those symptoms may get better when you quit. You may also get sick less often. If you are pregnant, quitting smoking can help to lower your chances of having a baby of low birth weight. What can I do to help me quit smoking? Talk with your doctor about what can help you quit smoking. Some things you can do (strategies) include:  Quitting smoking totally, instead of slowly cutting back how much you smoke over a period of time.  Going to in-person counseling. You are more likely to quit if you go to many counseling sessions.  Using resources and support systems, such as:  Online chats with a counselor.  Phone quitlines.  Printed self-help materials.  Support groups or group counseling.  Text messaging programs.  Mobile phone apps or applications.  Taking medicines. Some of these medicines may have nicotine in them. If you are pregnant or breastfeeding, do not take any medicines to quit smoking unless your doctor says it is okay. Talk with your doctor about counseling or other things that can help you. Talk with your doctor about using more than one strategy at the same time, such as taking medicines while you are also going to in-person counseling. This can help make quitting  easier. What things can I do to make it easier to quit? Quitting smoking might feel very hard at first, but there is a lot that you can do to make it easier. Take these steps:  Talk to your family and friends. Ask them to support and encourage you.  Call phone quitlines, reach out to support groups, or work with a counselor.  Ask people who smoke to not smoke around you.  Avoid places that make you want (trigger) to smoke, such as:  Bars.  Parties.  Smoke-break areas at work.  Spend time with people who do not smoke.  Lower the stress in your life. Stress can make you want to smoke. Try these things to help your stress:  Getting regular exercise.  Deep-breathing exercises.  Yoga.  Meditating.  Doing a body scan. To do this, close your eyes, focus on one area of your body at a time from head to toe, and notice which parts of your body are tense. Try to relax the muscles in those areas.  Download or buy apps on your mobile phone or tablet that can help you stick to your quit plan. There are many free apps, such as QuitGuide from the CDC (Centers for Disease Control and Prevention). You can find more support from smokefree.gov and other websites. This information is not intended to replace advice given to you by your health care provider. Make sure you discuss any questions you have with your health care provider. Document Released: 11/03/2008 Document Revised: 09/05/2015 Document   Reviewed: 05/24/2014 Elsevier Interactive Patient Education  2017 [place]. Pain Management Discharge Instructions  General Discharge Instructions :  If you need to reach your doctor call: Monday-Friday 8:00 am - 4:00 pm at [phone] or toll free [phone].  After clinic hours [phone] to have operator reach doctor.  Bring all of your medication bottles to all your appointments in the pain clinic.  To cancel or reschedule your appointment with Pain Management please remember to call  24 hours in advance to avoid a fee.  Refer to the educational materials which you have been given on: General Risks, I had my Procedure. Discharge Instructions, Post Sedation.  Post Procedure Instructions:  The drugs you were given will stay in your system until tomorrow, so for the next 24 hours you should not drive, make any legal decisions or drink any alcoholic beverages.  You may eat anything you prefer, but it is better to start with liquids then soups and crackers, and gradually work up to solid foods.  Please notify your doctor immediately if you have any unusual bleeding, trouble breathing or pain that is not related to your normal pain.  Depending on the type of procedure that was done, some parts of your body may feel week and/or numb.  This usually clears up by tonight or the next day.  Walk with the use of an assistive device or accompanied by an adult for the 24 hours.  You may use ice on the affected area for the first 24 hours.  Put ice in a Ziploc bag and cover with a towel and place against area 15 minutes on 15 minutes off.  You may switch to heat after 24 hours. Epidural Steroid Injection An epidural steroid injection is a shot of steroid medicine and numbing medicine that is given into the space between the spinal cord and the bones in your back (epidural space). The shot helps relieve pain caused by an irritated or swollen nerve root. The amount of pain relief you get from the injection depends on what is causing the nerve to be swollen and irritated, and how long your pain lasts. You are more likely to benefit from this injection if your pain is strong and comes on suddenly rather than if you have had pain for a long time. Tell a health care provider about: Any allergies you have. All medicines you are taking, including vitamins, herbs, eye drops, creams, and over-the-counter medicines. Any problems you or family members have had with anesthetic medicines. Any blood  disorders you have. Any surgeries you have had. Any medical conditions you have. Whether you are pregnant or may be pregnant. What are the risks? Generally, this is a safe procedure. However, problems may occur, including: Headache. Bleeding. Infection. Allergic reaction to medicines. Damage to your nerves. What happens before the procedure? Staying hydrated  Follow instructions from your health care provider about hydration, which may include: Up to 2 hours before the procedure - you may continue to drink clear liquids, such as water, clear fruit juice, black coffee, and plain tea. Eating and drinking restrictions  Follow instructions from your health care provider about eating and drinking, which may include: 8 hours before the procedure - stop eating heavy meals or foods such as meat, fried foods, or fatty foods. 6 hours before the procedure - stop eating light meals or foods, such as toast or cereal. 6 hours before the procedure - stop drinking milk or drinks that contain milk. 2 hours before the procedure -  stop drinking clear liquids. Medicine You may be given medicines to lower anxiety. Ask your health care provider about: Changing or stopping your regular medicines. This is especially important if you are taking diabetes medicines or blood thinners. Taking medicines such as aspirin and ibuprofen. These medicines can thin your blood. Do not take these medicines before your procedure if your health care provider instructs you not to. General instructions Plan to have someone take you home from the hospital or clinic. What happens during the procedure? You may receive a medicine to help you relax (sedative). You will be asked to lie on your abdomen. The injection site will be cleaned. A numbing medicine (local anesthetic) will be used to numb the injection site. A needle will be inserted through your skin into the epidural space. You may feel some discomfort when this happens.  An X-ray machine will be used to make sure the needle is put as close as possible to the affected nerve. A steroid medicine and a local anesthetic will be injected into the epidural space. The needle will be removed. A bandage (dressing) will be put over the injection site. What happens after the procedure? Your blood pressure, heart rate, breathing rate, and blood oxygen level will be monitored until the medicines you were given have worn off. Your arm or leg may feel weak or numb for a few hours. The injection site may feel sore. Do not drive for 24 hours if you received a sedative. This information is not intended to replace advice given to you by your health care provider. Make sure you discuss any questions you have with your health care provider. Document Released: 04/16/2007 Document Revised: 06/21/2015 Document Reviewed: 04/25/2015 Elsevier Interactive Patient Education  2017 [employer].

## 2016-02-13 ENCOUNTER — Telehealth: Payer: Self-pay | Admitting: *Deleted

## 2016-02-13 NOTE — Telephone Encounter (Signed)
Spoke with patient re; procedure from yesterday.  Denies any questions or concerns.

## 2016-03-07 ENCOUNTER — Encounter: Payer: Self-pay | Admitting: Pain Medicine

## 2016-03-07 ENCOUNTER — Ambulatory Visit: Payer: Medicaid Other | Attending: Pain Medicine | Admitting: Pain Medicine

## 2016-03-07 VITALS — BP 116/76 | HR 108 | Temp 97.8°F | Resp 16 | Ht 63.0 in | Wt 145.0 lb

## 2016-03-07 DIAGNOSIS — L930 Discoid lupus erythematosus: Secondary | ICD-10-CM | POA: Diagnosis not present

## 2016-03-07 DIAGNOSIS — M791 Myalgia: Secondary | ICD-10-CM

## 2016-03-07 DIAGNOSIS — Z823 Family history of stroke: Secondary | ICD-10-CM | POA: Diagnosis not present

## 2016-03-07 DIAGNOSIS — M5412 Radiculopathy, cervical region: Secondary | ICD-10-CM

## 2016-03-07 DIAGNOSIS — M25512 Pain in left shoulder: Secondary | ICD-10-CM | POA: Diagnosis not present

## 2016-03-07 DIAGNOSIS — Z90710 Acquired absence of both cervix and uterus: Secondary | ICD-10-CM | POA: Insufficient documentation

## 2016-03-07 DIAGNOSIS — M797 Fibromyalgia: Secondary | ICD-10-CM | POA: Diagnosis not present

## 2016-03-07 DIAGNOSIS — M25511 Pain in right shoulder: Secondary | ICD-10-CM | POA: Diagnosis not present

## 2016-03-07 DIAGNOSIS — Z79891 Long term (current) use of opiate analgesic: Secondary | ICD-10-CM | POA: Insufficient documentation

## 2016-03-07 DIAGNOSIS — M542 Cervicalgia: Secondary | ICD-10-CM | POA: Diagnosis not present

## 2016-03-07 DIAGNOSIS — Z833 Family history of diabetes mellitus: Secondary | ICD-10-CM | POA: Diagnosis not present

## 2016-03-07 DIAGNOSIS — M1991 Primary osteoarthritis, unspecified site: Secondary | ICD-10-CM | POA: Diagnosis not present

## 2016-03-07 DIAGNOSIS — M549 Dorsalgia, unspecified: Secondary | ICD-10-CM | POA: Diagnosis not present

## 2016-03-07 DIAGNOSIS — F17210 Nicotine dependence, cigarettes, uncomplicated: Secondary | ICD-10-CM | POA: Insufficient documentation

## 2016-03-07 DIAGNOSIS — M159 Polyosteoarthritis, unspecified: Secondary | ICD-10-CM

## 2016-03-07 DIAGNOSIS — Z79899 Other long term (current) drug therapy: Secondary | ICD-10-CM | POA: Insufficient documentation

## 2016-03-07 DIAGNOSIS — M150 Primary generalized (osteo)arthritis: Secondary | ICD-10-CM

## 2016-03-07 DIAGNOSIS — G8929 Other chronic pain: Secondary | ICD-10-CM

## 2016-03-07 DIAGNOSIS — G894 Chronic pain syndrome: Secondary | ICD-10-CM | POA: Diagnosis not present

## 2016-03-07 DIAGNOSIS — Z5181 Encounter for therapeutic drug level monitoring: Secondary | ICD-10-CM | POA: Insufficient documentation

## 2016-03-07 DIAGNOSIS — F1190 Opioid use, unspecified, uncomplicated: Secondary | ICD-10-CM

## 2016-03-07 DIAGNOSIS — M7918 Myalgia, other site: Secondary | ICD-10-CM

## 2016-03-07 MED ORDER — GABAPENTIN 300 MG PO CAPS: 300.0000 mg | ORAL_CAPSULE | Freq: Three times a day (TID) | ORAL | 0 refills | Status: DC

## 2016-03-07 MED ORDER — HYDROCODONE-ACETAMINOPHEN 5-325 MG PO TABS: 1.0000 | ORAL_TABLET | Freq: Two times a day (BID) | ORAL | 0 refills | Status: DC

## 2016-03-07 MED ORDER — CYCLOBENZAPRINE HCL 10 MG PO TABS: 10.0000 mg | ORAL_TABLET | Freq: Three times a day (TID) | ORAL | 2 refills | Status: DC | PRN

## 2016-03-07 MED ORDER — MELOXICAM 15 MG PO TABS: 15.0000 mg | ORAL_TABLET | Freq: Every day | ORAL | 0 refills | Status: DC

## 2016-03-07 NOTE — Patient Instructions (Addendum)
Pain Score  Introduction: The pain score used by this practice is the Verbal Numerical Rating Scale (VNRS-11). This is an 11-point scale. It is for adults and children 10 years or older. There are significant differences in how the pain score is reported, used, and applied. Forget everything you learned in the past and learn this scoring system.  General Information: The scale should reflect your current level of pain. Unless you are specifically asked for the level of your worst pain, or your average pain. If you are asked for one of these two, then it should be understood that it is over the past 24 hours.  Basic Activities of Daily Living (ADL): Personal hygiene, dressing, eating, transferring, and using restroom.  Instructions: Most patients tend to report their level of pain as a combination of two factors, their physical pain and their psychosocial pain. This last one is also known as "suffering" and it is reflection of how physical pain affects you socially and psychologically. From now on, report them separately. From this point on, when asked to report your pain level, report only your physical pain. Use the following table for reference.  Pain Clinic Pain Levels (0-5/10)  Pain Level Score Description  No Pain 0   Mild pain 1 Nagging, annoying, but does not interfere with basic activities of daily living (ADL). Patients are able to eat, bathe, get dressed, toileting (being able to get on and off the toilet and perform personal hygiene functions), transfer (move in and out of bed or a chair without assistance), and maintain continence (able to control bladder and bowel functions). Blood pressure and heart rate are unaffected. A normal heart rate for a healthy adult ranges from 60 to 100 bpm (beats per minute).   Mild to moderate pain 2 Noticeable and distracting. Impossible to hide from other people. More frequent flare-ups. Still possible to adapt and function close to normal. It can be very  annoying and may have occasional stronger flare-ups. With discipline, patients may get used to it and adapt.   Moderate pain 3 Interferes significantly with activities of daily living (ADL). It becomes difficult to feed, bathe, get dressed, get on and off the toilet or to perform personal hygiene functions. Difficult to get in and out of bed or a chair without assistance. Very distracting. With effort, it can be ignored when deeply involved in activities.   Moderately severe pain 4 Impossible to ignore for more than a few minutes. With effort, patients may still be able to manage work or participate in some social activities. Very difficult to concentrate. Signs of autonomic nervous system discharge are evident: dilated pupils (mydriasis); mild sweating (diaphoresis); sleep interference. Heart rate becomes elevated (>115 bpm). Diastolic blood pressure (lower number) rises above 100 mmHg. Patients find relief in laying down and not moving.   Severe pain 5 Intense and extremely unpleasant. Associated with frowning face and frequent crying. Pain overwhelms the senses.  Ability to do any activity or maintain social relationships becomes significantly limited. Conversation becomes difficult. Pacing back and forth is common, as getting into a comfortable position is nearly impossible. Pain wakes you up from deep sleep. Physical signs will be obvious: pupillary dilation; increased sweating; goosebumps; brisk reflexes; cold, clammy hands and feet; nausea, vomiting or dry heaves; loss of appetite; significant sleep disturbance with inability to fall asleep or to remain asleep. When persistent, significant weight loss is observed due to the complete loss of appetite and sleep deprivation.  Blood pressure and heart   rate becomes significantly elevated. Caution: If elevated blood pressure triggers a pounding headache associated with blurred vision, then the patient should immediately seek attention at an urgent or  emergency care unit, as these may be signs of an impending stroke.    Emergency Department Pain Levels (6-10/10)  Emergency Room Pain 6 Severely limiting. Requires emergency care and should not be seen or managed at an outpatient pain management facility. Communication becomes difficult and requires great effort. Assistance to reach the emergency department may be required. Facial flushing and profuse sweating along with potentially dangerous increases in heart rate and blood pressure will be evident.   Distressing pain 7 Self-care is very difficult. Assistance is required to transport, or use restroom. Assistance to reach the emergency department will be required. Tasks requiring coordination, such as bathing and getting dressed become very difficult.   Disabling pain 8 Self-care is no longer possible. At this level, pain is disabling. The individual is unable to do even the most "basic" activities such as walking, eating, bathing, dressing, transferring to a bed, or toileting. Fine motor skills are lost. It is difficult to think clearly.   Incapacitating pain 9 Pain becomes incapacitating. Thought processing is no longer possible. Difficult to remember your own name. Control of movement and coordination are lost.   The worst pain imaginable 10 At this level, most patients pass out from pain. When this level is reached, collapse of the autonomic nervous system occurs, leading to a sudden drop in blood pressure and heart rate. This in turn results in a temporary and dramatic drop in blood flow to the brain, leading to a loss of consciousness. Fainting is one of the body's self defense mechanisms. Passing out puts the brain in a calmed state and causes it to shut down for a while, in order to begin the healing process.    Summary: 1. Refer to this scale when providing [place] with your pain level. 2. Be accurate and careful when reporting your pain level. This will help with your care. 3. Over-reporting  your pain level will lead to loss of credibility. 4. Even a level of 1/10 means that there is pain and will be treated at our facility. 5. High, inaccurate reporting will be documented as "Symptom Exaggeration", leading to loss of credibility and suspicions of possible secondary gains such as obtaining more narcotics, or wanting to appear disabled, for fraudulent reasons. 6. Only pain levels of 5 or below will be seen at our facility. 7. Pain levels of 6 and above will be sent to the Emergency Department and the appointment cancelled.  GENERAL RISKS AND COMPLICATIONS  What are the risk, side effects and possible complications? Generally speaking, most procedures are safe.  However, with any procedure there are risks, side effects, and the possibility of complications.  The risks and complications are dependent upon the sites that are lesioned, or the type of nerve block to be performed.  The closer the procedure is to the spine, the more serious the risks are.  Great care is taken when placing the radio frequency needles, block needles or lesioning probes, but sometimes complications can occur. Infection: Any time there is an injection through the skin, there is a risk of infection.  This is why sterile conditions are used for these blocks.  There are four possible types of infection. Localized skin infection. Central Nervous System Infection-This can be in the form of Meningitis, which can be deadly. Epidural Infections-This can be in the form of  an epidural abscess, which can cause pressure inside of the spine, causing compression of the spinal cord with subsequent paralysis. This would require an emergency surgery to decompress, and there are no guarantees that the patient would recover from the paralysis. Discitis-This is an infection of the intervertebral discs.  It occurs in about 1% of discography procedures.  It is difficult to treat and it may lead to surgery.        2. Pain: the needles  have to go through skin and soft tissues, will cause soreness.       3. Damage to internal structures:  The nerves to be lesioned may be near blood vessels or    other nerves which can be potentially damaged.       4. Bleeding: Bleeding is more common if the patient is taking blood thinners such as  aspirin, Coumadin, Ticiid, Plavix, etc., or if he/she have some genetic predisposition  such as hemophilia. Bleeding into the spinal canal can cause compression of the spinal  cord with subsequent paralysis.  This would require an emergency surgery to  decompress and there are no guarantees that the patient would recover from the  paralysis.       5. Pneumothorax:  Puncturing of a lung is a possibility, every time a needle is introduced in  the area of the chest or upper back.  Pneumothorax refers to free air around the  collapsed lung(s), inside of the thoracic cavity (chest cavity).  Another two possible  complications related to a similar event would include: Hemothorax and Chylothorax.   These are variations of the Pneumothorax, where instead of air around the collapsed  lung(s), you may have blood or chyle, respectively.       6. Spinal headaches: They may occur with any procedures in the area of the spine.       7. Persistent CSF (Cerebro-Spinal Fluid) leakage: This is a rare problem, but may occur  with prolonged intrathecal or epidural catheters either due to the formation of a fistulous  track or a dural tear.       8. Nerve damage: By working so close to the spinal cord, there is always a possibility of  nerve damage, which could be as serious as a permanent spinal cord injury with  paralysis.       9. Death:  Although rare, severe deadly allergic reactions known as "Anaphylactic  reaction" can occur to any of the medications used.      10. Worsening of the symptoms:  We can always make thing worse.  What are the chances of something like this happening? Chances of any of this occuring are extremely  low.  By statistics, you have more of a chance of getting killed in a motor vehicle accident: while driving to the hospital than any of the above occurring .  Nevertheless, you should be aware that they are possibilities.  In general, it is similar to taking a shower.  Everybody knows that you can slip, hit your head and get killed.  Does that mean that you should not shower again?  Nevertheless always keep in mind that statistics do not mean anything if you happen to be on the wrong side of them.  Even if a procedure has a 1 (one) in a 1,000,000 (million) chance of going wrong, it you happen to be that one..Also, keep in mind that by statistics, you have more of a chance of having something go wrong when taking medications.  Who should not have this procedure? If you are on a blood thinning medication (e.g. Coumadin, Plavix, see list of "Blood Thinners"), or if you have an active infection going on, you should not have the procedure.  If you are taking any blood thinners, please inform your physician.  How should I prepare for this procedure? Do not eat or drink anything at least six hours prior to the procedure. Bring a driver with you .  It cannot be a taxi. Come accompanied by an adult that can drive you back, and that is strong enough to help you if your legs get weak or numb from the local anesthetic. Take all of your medicines the morning of the procedure with just enough water to swallow them. If you have diabetes, make sure that you are scheduled to have your procedure done first thing in the morning, whenever possible. If you have diabetes, take only half of your insulin dose and notify our nurse that you have done so as soon as you arrive at the clinic. If you are diabetic, but only take blood sugar pills (oral hypoglycemic), then do not take them on the morning of your procedure.  You may take them after you have had the procedure. Do not take aspirin or any aspirin-containing medications,  at least eleven (11) days prior to the procedure.  They may prolong bleeding. Wear loose fitting clothing that may be easy to take off and that you would not mind if it got stained with Betadine or blood. Do not wear any jewelry or perfume Remove any nail coloring.  It will interfere with some of our monitoring equipment.  NOTE: Remember that this is not meant to be interpreted as a complete list of all possible complications.  Unforeseen problems may occur.  BLOOD THINNERS The following drugs contain aspirin or other products, which can cause increased bleeding during surgery and should not be taken for 2 weeks prior to and 1 week after surgery.  If you should need take something for relief of minor pain, you may take acetaminophen which is found in Tylenol,m Datril, Anacin-3 and Panadol. It is not blood thinner. The products listed below are.  Do not take any of the products listed below in addition to any listed on your instruction sheet.  A.P.C or A.P.C with Codeine Codeine Phosphate Capsules #3 Ibuprofen Ridaura  ABC compound Congesprin Imuran rimadil  Advil Cope Indocin Robaxisal  Alka-Seltzer Effervescent Pain Reliever and Antacid Coricidin or Coricidin-D  Indomethacin Rufen  Alka-Seltzer plus Cold Medicine Cosprin Ketoprofen S-A-C Tablets  Anacin Analgesic Tablets or Capsules Coumadin Korlgesic Salflex  Anacin Extra Strength Analgesic tablets or capsules CP-2 Tablets Lanoril Salicylate  Anaprox Cuprimine Capsules Levenox Salocol  Anexsia-D Dalteparin Magan Salsalate  Anodynos Darvon compound Magnesium Salicylate Sine-off  Ansaid Dasin Capsules Magsal Sodium Salicylate  Anturane Depen Capsules Marnal Soma  APF Arthritis pain formula Dewitt's Pills Measurin Stanback  Argesic Dia-Gesic Meclofenamic Sulfinpyrazone  Arthritis Bayer Timed Release Aspirin Diclofenac Meclomen Sulindac  Arthritis pain formula Anacin Dicumarol Medipren Supac  Analgesic (Safety coated) Arthralgen Diffunasal  Mefanamic Suprofen  Arthritis Strength Bufferin Dihydrocodeine Mepro Compound Suprol  Arthropan liquid Dopirydamole Methcarbomol with Aspirin Synalgos  ASA tablets/Enseals Disalcid Micrainin Tagament  Ascriptin Doan's Midol Talwin  Ascriptin A/D Dolene Mobidin Tanderil  Ascriptin Extra Strength Dolobid Moblgesic Ticlid  Ascriptin with Codeine Doloprin or Doloprin with Codeine Momentum Tolectin  Asperbuf Duoprin Mono-gesic Trendar  Aspergum Duradyne Motrin or Motrin IB Triminicin  Aspirin plain, buffered or enteric  coated Durasal Myochrisine Trigesic  Aspirin Suppositories Easprin Nalfon Trillsate  Aspirin with Codeine Ecotrin Regular or Extra Strength Naprosyn Uracel  Atromid-S Efficin Naproxen Ursinus  Auranofin Capsules Elmiron Neocylate Vanquish  Axotal Emagrin Norgesic Verin  Azathioprine Empirin or Empirin with Codeine Normiflo Vitamin E  Azolid Emprazil Nuprin Voltaren  Bayer Aspirin plain, buffered or children's or timed BC Tablets or powders Encaprin Orgaran Warfarin Sodium  Buff-a-Comp Enoxaparin Orudis Zorpin  Buff-a-Comp with Codeine Equegesic Os-Cal-Gesic   Buffaprin Excedrin plain, buffered or Extra Strength Oxalid   Bufferin Arthritis Strength Feldene Oxphenbutazone   Bufferin plain or Extra Strength Feldene Capsules Oxycodone with Aspirin   Bufferin with Codeine Fenoprofen Fenoprofen Pabalate or Pabalate-SF   Buffets II Flogesic Panagesic   Buffinol plain or Extra Strength Florinal or Florinal with Codeine Panwarfarin   Buf-Tabs Flurbiprofen Penicillamine   Butalbital Compound Four-way cold tablets Penicillin   Butazolidin Fragmin Pepto-Bismol   Carbenicillin Geminisyn Percodan   Carna Arthritis Reliever Geopen Persantine   Carprofen Gold's salt Persistin   Chloramphenicol Goody's Phenylbutazone   Chloromycetin Haltrain Piroxlcam   Clmetidine heparin Plaquenil   Cllnoril Hyco-pap Ponstel   Clofibrate Hydroxy chloroquine Propoxyphen         Before stopping any of  these medications, be sure to consult the physician who ordered them.  Some, such as Coumadin (Warfarin) are ordered to prevent or treat serious conditions such as "deep thrombosis", "pumonary embolisms", and other heart problems.  The amount of time that you may need off of the medication may also vary with the medication and the reason for which you were taking it.  If you are taking any of these medications, please make sure you notify your pain physician before you undergo any procedures.         Epidural Steroid Injection Patient Information  Description: The epidural space surrounds the nerves as they exit the spinal cord.  In some patients, the nerves can be compressed and inflamed by a bulging disc or a tight spinal canal (spinal stenosis).  By injecting steroids into the epidural space, we can bring irritated nerves into direct contact with a potentially helpful medication.  These steroids act directly on the irritated nerves and can reduce swelling and inflammation which often leads to decreased pain.  Epidural steroids may be injected anywhere along the spine and from the neck to the low back depending upon the location of your pain.   After numbing the skin with local anesthetic (like Novocaine), a small needle is passed into the epidural space slowly.  You may experience a sensation of pressure while this is being done.  The entire block usually last less than 10 minutes.  Conditions which may be treated by epidural steroids:  Low back and leg pain Neck and arm pain Spinal stenosis Post-laminectomy syndrome Herpes zoster (shingles) pain Pain from compression fractures  Preparation for the injection:  Do not eat any solid food or dairy products within 8 hours of your appointment.  You may drink clear liquids up to 3 hours before appointment.  Clear liquids include water, black coffee, juice or soda.  No milk or cream please. You may take your regular medication, including pain  medications, with a sip of water before your appointment  Diabetics should hold regular insulin (if taken separately) and take 1/2 normal NPH dos the morning of the procedure.  Carry some sugar containing items with you to your appointment. A driver must accompany you and be prepared to drive you home after  your procedure.  Bring all your current medications with your. An IV may be inserted and sedation may be given at the discretion of the physician.   A blood pressure cuff, EKG and other monitors will often be applied during the procedure.  Some patients may need to have extra oxygen administered for a short period. You will be asked to provide medical information, including your allergies, prior to the procedure.  We must know immediately if you are taking blood thinners (like Coumadin/Warfarin)  Or if you are allergic to IV iodine contrast (dye). We must know if you could possible be pregnant.  Possible side-effects: Bleeding from needle site Infection (rare, may require surgery) Nerve injury (rare) Numbness & tingling (temporary) Difficulty urinating (rare, temporary) Spinal headache ( a headache worse with upright posture) Light -headedness (temporary) Pain at injection site (several days) Decreased blood pressure (temporary) Weakness in arm/leg (temporary) Pressure sensation in back/neck (temporary)  Call if you experience: Fever/chills associated with headache or increased back/neck pain. Headache worsened by an upright position. New onset weakness or numbness of an extremity below the injection site Hives or difficulty breathing (go to the emergency room) Inflammation or drainage at the infection site Severe back/neck pain Any new symptoms which are concerning to you  Please note:  Although the local anesthetic injected can often make your back or neck feel good for several hours after the injection, the pain will likely return.  It takes 3-7 days for steroids to work in the  epidural space.  You may not notice any pain relief for at least that one week.  If effective, we will often do a series of three injections spaced 3-6 weeks apart to maximally decrease your pain.  After the initial series, we generally will wait several months before considering a repeat injection of the same type.  If you have any questions, please call [phone] [facility]

## 2016-03-07 NOTE — Progress Notes (Signed)
Patient's Name: Tanya [name]  MRN: [id number]  Referring Provider: Ricardo Jericho*  DOB: 10-14-1960  PCP: Ricardo Jericho, NP  DOS: 03/07/2016  Note by: Kathlen Brunswick. Dossie Arbour, MD  Service setting: Ambulatory outpatient  Specialty: Interventional Pain Management  Location: [facility] (AMB) Pain Management Facility    Patient type: Established   Primary Reason(s) for Visit: Encounter for prescription drug management & post-procedure evaluation of chronic illness with mild to moderate exacerbation(Level of risk: moderate) CC: Neck Pain  HPI  [name] is a 56 y.o. year old, female patient, who comes today for a post-procedure evaluation and medication management. She has Anxiety; Chronic upper back pain (Location of Tertiary source of pain) (Bilateral) (R>L); Chronic discoid lupus erythematosus; Chronic low back pain (Bilateral) (R>L); Chronic neck pain (Location of Secondary source of pain) (Bilateral) (R>L); Discoid lupus; Diverticulitis; Encounter for long-term (current) use of high-risk medication; Fibromyalgia; Generalized anxiety disorder; Hypercholesterolemia; Hyperlipidemia; Major depressive disorder, recurrent ([place]); Pain medication agreement signed; Osteoarthritis; SLE (systemic lupus erythematosus) ([place]); Urinary incontinence; Long term prescription benzodiazepine use; Long term current use of opiate analgesic; Long term prescription opiate use; Opiate use (80 MME/Day); Chronic pain syndrome; Chronic upper extremity pain (Right); Chronic cervical radicular pain (Right); Chronic shoulder pain (Location of Primary Source of Pain) (Bilateral) (R>L); 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 Her primarily concern today is the Neck Pain  Pain Assessment: Self-Reported Pain Score: 4 /10 Clinically the patient looks like a 1/10 Reported level is inconsistent with clinical observations. Information on the  proper use of the pain scale provided to the patient today Pain Type: Chronic pain Pain Location: Neck Pain Orientation: Mid Pain Descriptors / Indicators: Throbbing, Tightness Pain Frequency: Constant  [name] was last seen on 02/12/2016 for a procedure. During today's appointment we reviewed [name]'s post-procedure results, as well as her outpatient medication regimen.  Further details on both, my assessment(s), as well as the proposed treatment plan, please see below.  Controlled Substance Pharmacotherapy Assessment REMS (Risk Evaluation and Mitigation Strategy)  Analgesic:Hydrocodone/APAP 5/325 one tablet by mouth twice a day (10 mg/day of hydrocodone) MME/day:31m/day DLandis Martins RN  03/07/2016 11:49 AM  Sign at close encounter Nursing Pain Medication Assessment:  Safety precautions to be maintained throughout the outpatient stay will include: orient to surroundings, keep bed in low position, maintain call bell within reach at all times, provide assistance with transfer out of bed and ambulation.  Medication Inspection Compliance: Pill count conducted under aseptic conditions, in front of the patient. Neither the pills nor the bottle was removed from the patient's sight at any time. Once count was completed pills were immediately returned to the patient in their original bottle.  Medication: Hydrocodone/APAP Pill/Patch Count: 0 of 60 pills remain Bottle Appearance: Standard pharmacy container. Clearly labeled. Filled Date: 01 / 16 / 2018 Last Medication intake:  Ran out of medicine more than 48 hours ago  LHumphreys;dose was 3 days ago.   Pharmacokinetics: Liberation and absorption (onset of action): WNL Distribution (time to peak effect): WNL Metabolism and excretion (duration of action): WNL         Pharmacodynamics: Desired effects: Analgesia: Ms. SColclasurereports >50% benefit. Functional ability: Patient reports that medication allows her to accomplish  basic ADLs Clinically meaningful improvement in function (CMIF): Sustained CMIF goals met Perceived effectiveness: Described as relatively effective, allowing for increase in activities of daily living (ADL) Undesirable effects: Side-effects or Adverse reactions: None reported  Monitoring: [place] PMP: Online review of the past 35-monthperiod conducted. Compliant with practice rules and regulations List of all UDS test(s) done:  Lab Results  Component Value Date   SUMMARY FINAL 11/23/2015   Last UDS on record: No results found for: TOXASSSELUR UDS interpretation: Compliant          Medication Assessment Form: Reviewed. Patient indicates being compliant with therapy Treatment compliance: Compliant Risk Assessment Profile: Aberrant behavior: See prior evaluations. None observed or detected today Comorbid factors increasing risk of overdose: See prior notes. No additional risks detected today Risk of substance use disorder (SUD): Low Opioid Risk Tool (ORT) Total Score: 1  Interpretation Table:  Score <3 = Low Risk for SUD  Score between 4-7 = Moderate Risk for SUD  Score >8 = High Risk for Opioid Abuse   Risk Mitigation Strategies:  Patient Counseling: Covered Patient-Prescriber Agreement (PPA): Present and active  Notification to other healthcare providers: Done  Pharmacologic Plan: No change in therapy, at this time  Post-Procedure Assessment  02/12/2016 Procedure: Right-sided cervical epidural steroid injection #2 under fluoroscopic guidance and IV sedation Post-procedure pain score: 0/10 (100% relief) Influential Factors: BMI: 25.69 kg/m Intra-procedural challenges: None observed Assessment challenges: Results reported today are inconsistent with those reported on procedure day, immediately before discharge. Previously the patient had reported 100% relief of the pain, before leaving the facility Post-procedural side-effects, adverse reactions, or complications: None  reported Reported issues: None  Sedation: Sedation provided. When no sedatives are used, the analgesic levels obtained are directly associated to the effectiveness of the local anesthetics. However, when sedation is provided, the level of analgesia obtained during the initial 1 hour following the intervention, is believed to be the result of a combination of factors. These factors may include, but are not limited to: 1. The effectiveness of the local anesthetics used. 2. The effects of the analgesic(s) and/or anxiolytic(s) used. 3. The degree of discomfort experienced by the patient at the time of the procedure. 4. The patients ability and reliability in recalling and recording the events. 5. The presence and influence of possible secondary gains and/or psychosocial factors. Reported result: Relief experienced during the 1st hour after the procedure: 50 % (Ultra-Short Term Relief) Interpretative annotation: Inaccurate and unreliable report. Patient does not appear to have understood instructions on differential evaluation of treated vs untreated area, leading to an inaccurate global report  Effects of local anesthetic: The analgesic effects attained during this period are directly associated to the localized infiltration of local anesthetics and therefore cary significant diagnostic value as to the etiological location, or anatomical origin, of the pain. Expected duration of relief is directly dependent on the pharmacodynamics of the local anesthetic used. Long-acting (4-6 hours) anesthetics used.  Reported result: Relief during the next 4 to 6 hour after the procedure: 50 % (Short-Term Relief) Interpretative annotation: Inaccurate and unreliable report. Patient does not appear to have understood instructions on differential evaluation of treated vs untreated area, leading to an inaccurate global report  Long-term benefit: Defined as the period of time past the expected duration of local anesthetics.  With the possible exception of prolonged sympathetic blockade from the local anesthetics, benefits during this period are typically attributed to, or associated with, other factors such as analgesic sensory neuropraxia, antiinflammatory effects, or beneficial biochemical changes provided by agents other than the local anesthetics Reported result: Extended relief following procedure: 100 % (100% lasted until 2 days ago) (Long-Term Relief) Interpretative annotation: Good relief. This could suggest inflammation to  be a significant component in the etiology to the pain.          Current benefits: Defined as persistent relief that continues at this point in time.   Reported results: Treated area: <50 %       100% of upper extremity pain remains gone. The only pain left is in the neck, suggesting possible facet etiology. Interpretative annotation: Partial benefit. This would suggest further treatment needed  Interpretation: Results would suggest a successful diagnostic intervention.          Laboratory Chemistry  Inflammation Markers Lab Results  Component Value Date   ESRSEDRATE 8 11/23/2015   CRP <0.8 11/23/2015   Renal Function Lab Results  Component Value Date   BUN 10 11/23/2015   CREATININE 0.67 11/23/2015   GFRAA >60 11/23/2015   GFRNONAA >60 11/23/2015   Hepatic Function Lab Results  Component Value Date   AST 23 11/23/2015   ALT 15 11/23/2015   ALBUMIN 4.6 11/23/2015   Electrolytes Lab Results  Component Value Date   NA 138 11/23/2015   K 4.0 11/23/2015   CL 102 11/23/2015   CALCIUM 9.5 11/23/2015   MG 2.1 11/23/2015   Pain Modulating Vitamins Lab Results  Component Value Date   25OHVITD1 28 (L) 11/23/2015   25OHVITD2 <1.0 11/23/2015   25OHVITD3 28 11/23/2015   VITAMINB12 318 11/23/2015   Coagulation Parameters No results found for: INR, LABPROT, APTT, PLT Cardiovascular No results found for: BNP, HGB, HCT Note: Lab results reviewed.  Recent Diagnostic  Imaging Review  Dg C-arm 1-60 Min-no Report  Result Date: 02/12/2016 There is no Radiologist interpretation  for this exam.  Note: Imaging results reviewed.          Meds  The patient has a current medication list which includes the following prescription(s): vitamin d3, cyclobenzaprine, duloxetine, gabapentin, hydrocodone-acetaminophen, meloxicam, mometasone, and pimecrolimus.  Current Outpatient Prescriptions on File Prior to Visit  Medication Sig  . Cholecalciferol (VITAMIN D3) 2000 units capsule Take 1 capsule (2,000 Units total) by mouth daily.  . DULoxetine (CYMBALTA) 30 MG capsule TAKE 1 CAPSULE(30 MG) BY MOUTH EVERY DAY  . mometasone (ELOCON) 0.1 % cream Apply 1 application topically daily.  . pimecrolimus (ELIDEL) 1 % cream Apply 1 application topically 2 (two) times daily.   No current facility-administered medications on file prior to visit.    ROS  Constitutional: Denies any fever or chills Gastrointestinal: No reported hemesis, hematochezia, vomiting, or acute GI distress Musculoskeletal: Denies any acute onset joint swelling, redness, loss of ROM, or weakness Neurological: No reported episodes of acute onset apraxia, aphasia, dysarthria, agnosia, amnesia, paralysis, loss of coordination, or loss of consciousness  Allergies  Ms. Vosler is allergic to methocarbamol; trazodone and nefazodone; bupropion; oxycodone-acetaminophen; and tramadol.  [place]  Drug: Ms. Lama  reports that she does not use drugs. Alcohol:  reports that she does not drink alcohol. Tobacco:  reports that she has been smoking Cigarettes.  She has been smoking about 0.50 packs per day. She has never used smokeless tobacco. Medical:  has a past medical history of Anxiety; Depression; Hyperlipidemia; and Lupus. Family: family history includes Cancer in her mother; Diabetes in her brother; Emphysema in her mother; Glaucoma in her father; Heart disease in her father; Hypertension in her mother;  Stroke in her mother.  Past Surgical History:  Procedure Laterality Date  . ABDOMINAL HYSTERECTOMY     Constitutional Exam  General appearance: Well nourished, well developed, and well hydrated. In no apparent  acute distress Vitals:   03/07/16 1141  BP: 116/76  Pulse: (!) 108  Resp: 16  Temp: 97.8 F (36.6 C)  TempSrc: Oral  SpO2: 97%  Weight: 145 lb (65.8 kg)  Height: 5' 3"  (1.6 m)   BMI Assessment: Estimated body mass index is 25.69 kg/m as calculated from the following:   Height as of this encounter: 5' 3"  (1.6 m).   Weight as of this encounter: 145 lb (65.8 kg).  BMI interpretation table: BMI level Category Range association with higher incidence of chronic pain  <18 kg/m2 Underweight   18.5-24.9 kg/m2 Ideal body weight   25-29.9 kg/m2 Overweight Increased incidence by 20%  30-34.9 kg/m2 Obese (Class I) Increased incidence by 68%  35-39.9 kg/m2 Severe obesity (Class II) Increased incidence by 136%  >40 kg/m2 Extreme obesity (Class III) Increased incidence by 254%   BMI Readings from Last 4 Encounters:  03/07/16 25.69 kg/m  02/12/16 25.69 kg/m  02/06/16 25.33 kg/m  12/27/15 26.57 kg/m   Wt Readings from Last 4 Encounters:  03/07/16 145 lb (65.8 kg)  02/12/16 145 lb (65.8 kg)  02/06/16 143 lb (64.9 kg)  12/27/15 150 lb (68 kg)  Psych/Mental status: Alert, oriented x 3 (person, place, & time)       Eyes: PERLA Respiratory: No evidence of acute respiratory distress  Cervical Spine Exam  Inspection: No masses, redness, or swelling Alignment: Symmetrical Functional ROM: Diminished ROM Stability: No instability detected Muscle strength & Tone: Functionally intact Sensory: Movement-associated discomfort Palpation: Tender  Upper Extremity (UE) Exam    Side: Right upper extremity  Side: Left upper extremity  Inspection: No masses, redness, swelling, or asymmetry. No contractures  Inspection: No masses, redness, swelling, or asymmetry. No contractures   Functional ROM: Unrestricted ROM          Functional ROM: Unrestricted ROM          Muscle strength & Tone: Functionally intact  Muscle strength & Tone: Functionally intact  Sensory: Unimpaired  Sensory: Unimpaired  Palpation: Euthermic  Palpation: Euthermic  Specialized Test(s): Deferred         Specialized Test(s): Deferred          Thoracic Spine Exam  Inspection: No masses, redness, or swelling Alignment: Symmetrical Functional ROM: Unrestricted ROM Stability: No instability detected Sensory: Unimpaired Muscle strength & Tone: Functionally intact Palpation: Non-contributory  Lumbar Spine Exam  Inspection: No masses, redness, or swelling Alignment: Symmetrical Functional ROM: Unrestricted ROM Stability: No instability detected Muscle strength & Tone: Functionally intact Sensory: Unimpaired Palpation: Non-contributory Provocative Tests: Lumbar Hyperextension and rotation test: evaluation deferred today       Patrick's Maneuver: evaluation deferred today              Gait & Posture Assessment  Ambulation: Unassisted Gait: Relatively normal for age and body habitus Posture: WNL   Lower Extremity Exam    Side: Right lower extremity  Side: Left lower extremity  Inspection: No masses, redness, swelling, or asymmetry. No contractures  Inspection: No masses, redness, swelling, or asymmetry. No contractures  Functional ROM: Unrestricted ROM          Functional ROM: Unrestricted ROM          Muscle strength & Tone: Functionally intact  Muscle strength & Tone: Functionally intact  Sensory: Unimpaired  Sensory: Unimpaired  Palpation: No palpable anomalies  Palpation: No palpable anomalies   Assessment  Primary Diagnosis & Pertinent Problem List: The primary encounter diagnosis was Chronic shoulder pain (Location of Primary  Source of Pain) (Bilateral) (R>L). Diagnoses of Chronic neck pain (Location of Secondary source of pain) (Bilateral) (R>L), Chronic upper back pain (Location of  Tertiary source of pain) (Bilateral) (R>L), Long term current use of opiate analgesic, Opiate use (80 MME/Day), Fibromyalgia, Primary osteoarthritis involving multiple joints, Musculoskeletal pain, Chronic cervical radicular pain (Right), and Chronic pain syndrome were also pertinent to this visit.  Status Diagnosis  Controlled Controlled Controlled 1. Chronic shoulder pain (Location of Primary Source of Pain) (Bilateral) (R>L)   2. Chronic neck pain (Location of Secondary source of pain) (Bilateral) (R>L)   3. Chronic upper back pain (Location of Tertiary source of pain) (Bilateral) (R>L)   4. Long term current use of opiate analgesic   5. Opiate use (80 MME/Day)   6. Fibromyalgia   7. Primary osteoarthritis involving multiple joints   8. Musculoskeletal pain   9. Chronic cervical radicular pain (Right)   10. Chronic pain syndrome      Plan of Care  Pharmacotherapy (Medications Ordered): Meds ordered this encounter  Medications  . meloxicam (MOBIC) 15 MG tablet    Sig: Take 1 tablet (15 mg total) by mouth daily.    Dispense:  90 tablet    Refill:  0    Do not add this medication to the electronic "Automatic Refill" notification system. Patient may have prescription filled one day early if pharmacy is closed on scheduled refill date.  Marland Kitchen HYDROcodone-acetaminophen (NORCO/VICODIN) 5-325 MG tablet    Sig: Take 1 tablet by mouth 2 (two) times daily.    Dispense:  60 tablet    Refill:  0    Do not place this medication, or any other prescription from our practice, on "Automatic Refill". Patient may have prescription filled one day early if pharmacy is closed on scheduled refill date. Do not fill until: 03/07/16 To last until: 04/06/16  . gabapentin (NEURONTIN) 300 MG capsule    Sig: Take 1 capsule (300 mg total) by mouth 3 (three) times daily.    Dispense:  270 capsule    Refill:  0    Do not add this medication to the electronic "Automatic Refill" notification system. Patient may have  prescription filled one day early if pharmacy is closed on scheduled refill date.  . cyclobenzaprine (FLEXERIL) 10 MG tablet    Sig: Take 1 tablet (10 mg total) by mouth 3 (three) times daily as needed for muscle spasms.    Dispense:  90 tablet    Refill:  2    Do not add this medication to the electronic "Automatic Refill" notification system. Patient may have prescription filled one day early if pharmacy is closed on scheduled refill date.   New Prescriptions   No medications on file   Medications administered today: Ms. Aleshire had no medications administered during this visit. Lab-work, procedure(s), and/or referral(s): Orders Placed This Encounter  Procedures  . Cervical Epidural Injection   Imaging and/or referral(s): None  Interventional therapies: Planned, scheduled, and/or pending:   Right-sided cervical epidural steroid injection #3 under fluoroscopic guidance and IV sedation   Considering:   Diagnostic bilateral intra-articular shoulder joint injection  Diagnostic bilateral suprascapular nerve block  Possible bilateral suprascapular nerve RFA  Right-sided cervical epidural steroid injection #3 under fluoroscopic guidance and IV sedation Diagnostic bilateral cervical facet block possible bilateral cervical facet radiofrequency ablation  Diagnostic bilateral lumbar facet block  Possible bilateral lumbar facet radiofrequency ablation    Palliative PRN treatment(s):   Palliative right-sided cervical epidural steroid injection under  fluoroscopic guidance and IV sedation   Provider-requested follow-up: Return for procedure (ASAP): Right CESI #3.  Future Appointments Date Time Provider [place]  03/18/2016 8:00 AM Milinda Pointer, MD [facility] None  04/15/2016 1:00 PM Milinda Pointer, MD [facility] None   Primary Care Physician: Ricardo Jericho, NP Location: [facility] Facility Note by: Kathlen Brunswick. Dossie Arbour, M.D, DABA, DABAPM,  DABPM, DABIPP, FIPP Date: 03/07/2016; Time: 8:27 AM  Pain Score Disclaimer: We use the NRS-11 scale. This is a self-reported, subjective measurement of pain severity with only modest accuracy. It is used primarily to identify changes within a particular patient. It must be understood that outpatient pain scales are significantly less accurate that those used for research, where they can be applied under ideal controlled circumstances with minimal exposure to variables. In reality, the score is likely to be a combination of pain intensity and pain affect, where pain affect describes the degree of emotional arousal or changes in action readiness caused by the sensory experience of pain. Factors such as social and work situation, setting, emotional state, anxiety levels, expectation, and prior pain experience may influence pain perception and show large inter-individual differences that may also be affected by time variables.  Patient instructions provided during this appointment: Patient Instructions   Pain Score  Introduction: The pain score used by this practice is the Verbal Numerical Rating Scale (VNRS-11). This is an 11-point scale. It is for adults and children 10 years or older. There are significant differences in how the pain score is reported, used, and applied. Forget everything you learned in the past and learn this scoring system.  General Information: The scale should reflect your current level of pain. Unless you are specifically asked for the level of your worst pain, or your average pain. If you are asked for one of these two, then it should be understood that it is over the past 24 hours.  Basic Activities of Daily Living (ADL): Personal hygiene, dressing, eating, transferring, and using restroom.  Instructions: Most patients tend to report their level of pain as a combination of two factors, their physical pain and their psychosocial pain. This last one is also known as "suffering"  and it is reflection of how physical pain affects you socially and psychologically. From now on, report them separately. From this point on, when asked to report your pain level, report only your physical pain. Use the following table for reference.  Pain Clinic Pain Levels (0-5/10)  Pain Level Score Description  No Pain 0   Mild pain 1 Nagging, annoying, but does not interfere with basic activities of daily living (ADL). Patients are able to eat, bathe, get dressed, toileting (being able to get on and off the toilet and perform personal hygiene functions), transfer (move in and out of bed or a chair without assistance), and maintain continence (able to control bladder and bowel functions). Blood pressure and heart rate are unaffected. A normal heart rate for a healthy adult ranges from 60 to 100 bpm (beats per minute).   Mild to moderate pain 2 Noticeable and distracting. Impossible to hide from other people. More frequent flare-ups. Still possible to adapt and function close to normal. It can be very annoying and may have occasional stronger flare-ups. With discipline, patients may get used to it and adapt.   Moderate pain 3 Interferes significantly with activities of daily living (ADL). It becomes difficult to feed, bathe, get dressed, get on and off the toilet or to perform personal  hygiene functions. Difficult to get in and out of bed or a chair without assistance. Very distracting. With effort, it can be ignored when deeply involved in activities.   Moderately severe pain 4 Impossible to ignore for more than a few minutes. With effort, patients may still be able to manage work or participate in some social activities. Very difficult to concentrate. Signs of autonomic nervous system discharge are evident: dilated pupils (mydriasis); mild sweating (diaphoresis); sleep interference. Heart rate becomes elevated (>115 bpm). Diastolic blood pressure (lower number) rises above 100 mmHg. Patients find  relief in laying down and not moving.   Severe pain 5 Intense and extremely unpleasant. Associated with frowning face and frequent crying. Pain overwhelms the senses.  Ability to do any activity or maintain social relationships becomes significantly limited. Conversation becomes difficult. Pacing back and forth is common, as getting into a comfortable position is nearly impossible. Pain wakes you up from deep sleep. Physical signs will be obvious: pupillary dilation; increased sweating; goosebumps; brisk reflexes; cold, clammy hands and feet; nausea, vomiting or dry heaves; loss of appetite; significant sleep disturbance with inability to fall asleep or to remain asleep. When persistent, significant weight loss is observed due to the complete loss of appetite and sleep deprivation.  Blood pressure and heart rate becomes significantly elevated. Caution: If elevated blood pressure triggers a pounding headache associated with blurred vision, then the patient should immediately seek attention at an urgent or emergency care unit, as these may be signs of an impending stroke.    Emergency Department Pain Levels (6-10/10)  Emergency Room Pain 6 Severely limiting. Requires emergency care and should not be seen or managed at an outpatient pain management facility. Communication becomes difficult and requires great effort. Assistance to reach the emergency department may be required. Facial flushing and profuse sweating along with potentially dangerous increases in heart rate and blood pressure will be evident.   Distressing pain 7 Self-care is very difficult. Assistance is required to transport, or use restroom. Assistance to reach the emergency department will be required. Tasks requiring coordination, such as bathing and getting dressed become very difficult.   Disabling pain 8 Self-care is no longer possible. At this level, pain is disabling. The individual is unable to do even the most "basic" activities such  as walking, eating, bathing, dressing, transferring to a bed, or toileting. Fine motor skills are lost. It is difficult to think clearly.   Incapacitating pain 9 Pain becomes incapacitating. Thought processing is no longer possible. Difficult to remember your own name. Control of movement and coordination are lost.   The worst pain imaginable 10 At this level, most patients pass out from pain. When this level is reached, collapse of the autonomic nervous system occurs, leading to a sudden drop in blood pressure and heart rate. This in turn results in a temporary and dramatic drop in blood flow to the brain, leading to a loss of consciousness. Fainting is one of the body's self defense mechanisms. Passing out puts the brain in a calmed state and causes it to shut down for a while, in order to begin the healing process.    Summary: 1. Refer to this scale when providing [place] with your pain level. 2. Be accurate and careful when reporting your pain level. This will help with your care. 3. Over-reporting your pain level will lead to loss of credibility. 4. Even a level of 1/10 means that there is pain and will be treated at our facility. 5.  High, inaccurate reporting will be documented as "Symptom Exaggeration", leading to loss of credibility and suspicions of possible secondary gains such as obtaining more narcotics, or wanting to appear disabled, for fraudulent reasons. 6. Only pain levels of 5 or below will be seen at our facility. 7. Pain levels of 6 and above will be sent to the Emergency Department and the appointment cancelled.  GENERAL RISKS AND COMPLICATIONS  What are the risk, side effects and possible complications? Generally speaking, most procedures are safe.  However, with any procedure there are risks, side effects, and the possibility of complications.  The risks and complications are dependent upon the sites that are lesioned, or the type of nerve block to be performed.  The closer the  procedure is to the spine, the more serious the risks are.  Great care is taken when placing the radio frequency needles, block needles or lesioning probes, but sometimes complications can occur. Infection: Any time there is an injection through the skin, there is a risk of infection.  This is why sterile conditions are used for these blocks.  There are four possible types of infection. Localized skin infection. Central Nervous System Infection-This can be in the form of Meningitis, which can be deadly. Epidural Infections-This can be in the form of an epidural abscess, which can cause pressure inside of the spine, causing compression of the spinal cord with subsequent paralysis. This would require an emergency surgery to decompress, and there are no guarantees that the patient would recover from the paralysis. Discitis-This is an infection of the intervertebral discs.  It occurs in about 1% of discography procedures.  It is difficult to treat and it may lead to surgery.        2. Pain: the needles have to go through skin and soft tissues, will cause soreness.       3. Damage to internal structures:  The nerves to be lesioned may be near blood vessels or    other nerves which can be potentially damaged.       4. Bleeding: Bleeding is more common if the patient is taking blood thinners such as  aspirin, Coumadin, Ticiid, Plavix, etc., or if he/she have some genetic predisposition  such as hemophilia. Bleeding into the spinal canal can cause compression of the spinal  cord with subsequent paralysis.  This would require an emergency surgery to  decompress and there are no guarantees that the patient would recover from the  paralysis.       5. Pneumothorax:  Puncturing of a lung is a possibility, every time a needle is introduced in  the area of the chest or upper back.  Pneumothorax refers to free air around the  collapsed lung(s), inside of the thoracic cavity (chest cavity).  Another two possible   complications related to a similar event would include: Hemothorax and Chylothorax.   These are variations of the Pneumothorax, where instead of air around the collapsed  lung(s), you may have blood or chyle, respectively.       6. Spinal headaches: They may occur with any procedures in the area of the spine.       7. Persistent CSF (Cerebro-Spinal Fluid) leakage: This is a rare problem, but may occur  with prolonged intrathecal or epidural catheters either due to the formation of a fistulous  track or a dural tear.       8. Nerve damage: By working so close to the spinal cord, there is always a possibility of  nerve damage, which could be as serious as a permanent spinal cord injury with  paralysis.       9. Death:  Although rare, severe deadly allergic reactions known as "Anaphylactic  reaction" can occur to any of the medications used.      10. Worsening of the symptoms:  We can always make thing worse.  What are the chances of something like this happening? Chances of any of this occuring are extremely low.  By statistics, you have more of a chance of getting killed in a motor vehicle accident: while driving to the hospital than any of the above occurring .  Nevertheless, you should be aware that they are possibilities.  In general, it is similar to taking a shower.  Everybody knows that you can slip, hit your head and get killed.  Does that mean that you should not shower again?  Nevertheless always keep in mind that statistics do not mean anything if you happen to be on the wrong side of them.  Even if a procedure has a 1 (one) in a 1,000,000 (million) chance of going wrong, it you happen to be that one..Also, keep in mind that by statistics, you have more of a chance of having something go wrong when taking medications.  Who should not have this procedure? If you are on a blood thinning medication (e.g. Coumadin, Plavix, see list of "Blood Thinners"), or if you have an active infection going on,  you should not have the procedure.  If you are taking any blood thinners, please inform your physician.  How should I prepare for this procedure? Do not eat or drink anything at least six hours prior to the procedure. Bring a driver with you .  It cannot be a taxi. Come accompanied by an adult that can drive you back, and that is strong enough to help you if your legs get weak or numb from the local anesthetic. Take all of your medicines the morning of the procedure with just enough water to swallow them. If you have diabetes, make sure that you are scheduled to have your procedure done first thing in the morning, whenever possible. If you have diabetes, take only half of your insulin dose and notify our nurse that you have done so as soon as you arrive at the clinic. If you are diabetic, but only take blood sugar pills (oral hypoglycemic), then do not take them on the morning of your procedure.  You may take them after you have had the procedure. Do not take aspirin or any aspirin-containing medications, at least eleven (11) days prior to the procedure.  They may prolong bleeding. Wear loose fitting clothing that may be easy to take off and that you would not mind if it got stained with Betadine or blood. Do not wear any jewelry or perfume Remove any nail coloring.  It will interfere with some of our monitoring equipment.  NOTE: Remember that this is not meant to be interpreted as a complete list of all possible complications.  Unforeseen problems may occur.  BLOOD THINNERS The following drugs contain aspirin or other products, which can cause increased bleeding during surgery and should not be taken for 2 weeks prior to and 1 week after surgery.  If you should need take something for relief of minor pain, you may take acetaminophen which is found in Tylenol,m Datril, Anacin-3 and Panadol. It is not blood thinner. The products listed below are.  Do not take any of  the products listed below in  addition to any listed on your instruction sheet.  A.P.C or A.P.C with Codeine Codeine Phosphate Capsules #3 Ibuprofen Ridaura  ABC compound Congesprin Imuran rimadil  Advil Cope Indocin Robaxisal  Alka-Seltzer Effervescent Pain Reliever and Antacid Coricidin or Coricidin-D  Indomethacin Rufen  Alka-Seltzer plus Cold Medicine Cosprin Ketoprofen S-A-C Tablets  Anacin Analgesic Tablets or Capsules Coumadin Korlgesic Salflex  Anacin Extra Strength Analgesic tablets or capsules CP-2 Tablets Lanoril Salicylate  Anaprox Cuprimine Capsules Levenox Salocol  Anexsia-D Dalteparin Magan Salsalate  Anodynos Darvon compound Magnesium Salicylate Sine-off  Ansaid Dasin Capsules Magsal Sodium Salicylate  Anturane Depen Capsules Marnal Soma  APF Arthritis pain formula Dewitt's Pills Measurin Stanback  Argesic Dia-Gesic Meclofenamic Sulfinpyrazone  Arthritis Bayer Timed Release Aspirin Diclofenac Meclomen Sulindac  Arthritis pain formula Anacin Dicumarol Medipren Supac  Analgesic (Safety coated) Arthralgen Diffunasal Mefanamic Suprofen  Arthritis Strength Bufferin Dihydrocodeine Mepro Compound Suprol  Arthropan liquid Dopirydamole Methcarbomol with Aspirin Synalgos  ASA tablets/Enseals Disalcid Micrainin Tagament  Ascriptin Doan's Midol Talwin  Ascriptin A/D Dolene Mobidin Tanderil  Ascriptin Extra Strength Dolobid Moblgesic Ticlid  Ascriptin with Codeine Doloprin or Doloprin with Codeine Momentum Tolectin  Asperbuf Duoprin Mono-gesic Trendar  Aspergum Duradyne Motrin or Motrin IB Triminicin  Aspirin plain, buffered or enteric coated Durasal Myochrisine Trigesic  Aspirin Suppositories Easprin Nalfon Trillsate  Aspirin with Codeine Ecotrin Regular or Extra Strength Naprosyn Uracel  Atromid-S Efficin Naproxen Ursinus  Auranofin Capsules Elmiron Neocylate Vanquish  Axotal Emagrin Norgesic Verin  Azathioprine Empirin or Empirin with Codeine Normiflo Vitamin E  Azolid Emprazil Nuprin Voltaren  Bayer  Aspirin plain, buffered or children's or timed BC Tablets or powders Encaprin Orgaran Warfarin Sodium  Buff-a-Comp Enoxaparin Orudis Zorpin  Buff-a-Comp with Codeine Equegesic Os-Cal-Gesic   Buffaprin Excedrin plain, buffered or Extra Strength Oxalid   Bufferin Arthritis Strength Feldene Oxphenbutazone   Bufferin plain or Extra Strength Feldene Capsules Oxycodone with Aspirin   Bufferin with Codeine Fenoprofen Fenoprofen Pabalate or Pabalate-SF   Buffets II Flogesic Panagesic   Buffinol plain or Extra Strength Florinal or Florinal with Codeine Panwarfarin   Buf-Tabs Flurbiprofen Penicillamine   Butalbital Compound Four-way cold tablets Penicillin   Butazolidin Fragmin Pepto-Bismol   Carbenicillin Geminisyn Percodan   Carna Arthritis Reliever Geopen Persantine   Carprofen Gold's salt Persistin   Chloramphenicol Goody's Phenylbutazone   Chloromycetin Haltrain Piroxlcam   Clmetidine heparin Plaquenil   Cllnoril Hyco-pap Ponstel   Clofibrate Hydroxy chloroquine Propoxyphen         Before stopping any of these medications, be sure to consult the physician who ordered them.  Some, such as Coumadin (Warfarin) are ordered to prevent or treat serious conditions such as "deep thrombosis", "pumonary embolisms", and other heart problems.  The amount of time that you may need off of the medication may also vary with the medication and the reason for which you were taking it.  If you are taking any of these medications, please make sure you notify your pain physician before you undergo any procedures.         Epidural Steroid Injection Patient Information  Description: The epidural space surrounds the nerves as they exit the spinal cord.  In some patients, the nerves can be compressed and inflamed by a bulging disc or a tight spinal canal (spinal stenosis).  By injecting steroids into the epidural space, we can bring irritated nerves into direct contact with a potentially helpful medication.   These steroids act directly on the irritated nerves and can  reduce swelling and inflammation which often leads to decreased pain.  Epidural steroids may be injected anywhere along the spine and from the neck to the low back depending upon the location of your pain.   After numbing the skin with local anesthetic (like Novocaine), a small needle is passed into the epidural space slowly.  You may experience a sensation of pressure while this is being done.  The entire block usually last less than 10 minutes.  Conditions which may be treated by epidural steroids:  Low back and leg pain Neck and arm pain Spinal stenosis Post-laminectomy syndrome Herpes zoster (shingles) pain Pain from compression fractures  Preparation for the injection:  Do not eat any solid food or dairy products within 8 hours of your appointment.  You may drink clear liquids up to 3 hours before appointment.  Clear liquids include water, black coffee, juice or soda.  No milk or cream please. You may take your regular medication, including pain medications, with a sip of water before your appointment  Diabetics should hold regular insulin (if taken separately) and take 1/2 normal NPH dos the morning of the procedure.  Carry some sugar containing items with you to your appointment. A driver must accompany you and be prepared to drive you home after your procedure.  Bring all your current medications with your. An IV may be inserted and sedation may be given at the discretion of the physician.   A blood pressure cuff, EKG and other monitors will often be applied during the procedure.  Some patients may need to have extra oxygen administered for a short period. You will be asked to provide medical information, including your allergies, prior to the procedure.  We must know immediately if you are taking blood thinners (like Coumadin/Warfarin)  Or if you are allergic to IV iodine contrast (dye). We must know if you could possible be  pregnant.  Possible side-effects: Bleeding from needle site Infection (rare, may require surgery) Nerve injury (rare) Numbness & tingling (temporary) Difficulty urinating (rare, temporary) Spinal headache ( a headache worse with upright posture) Light -headedness (temporary) Pain at injection site (several days) Decreased blood pressure (temporary) Weakness in arm/leg (temporary) Pressure sensation in back/neck (temporary)  Call if you experience: Fever/chills associated with headache or increased back/neck pain. Headache worsened by an upright position. New onset weakness or numbness of an extremity below the injection site Hives or difficulty breathing (go to the emergency room) Inflammation or drainage at the infection site Severe back/neck pain Any new symptoms which are concerning to you  Please note:  Although the local anesthetic injected can often make your back or neck feel good for several hours after the injection, the pain will likely return.  It takes 3-7 days for steroids to work in the epidural space.  You may not notice any pain relief for at least that one week.  If effective, we will often do a series of three injections spaced 3-6 weeks apart to maximally decrease your pain.  After the initial series, we generally will wait several months before considering a repeat injection of the same type.  If you have any questions, please call [phone] [facility]

## 2016-03-07 NOTE — Progress Notes (Signed)
Nursing Pain Medication Assessment:  Safety precautions to be maintained throughout the outpatient stay will include: orient to surroundings, keep bed in low position, maintain call bell within reach at all times, provide assistance with transfer out of bed and ambulation.  Medication Inspection Compliance: Pill count conducted under aseptic conditions, in front of the patient. Neither the pills nor the bottle was removed from the patient's sight at any time. Once count was completed pills were immediately returned to the patient in their original bottle.  Medication: Hydrocodone/APAP Pill/Patch Count: 0 of 60 pills remain Bottle Appearance: Standard pharmacy container. Clearly labeled. Filled Date: 01 / 16 / 2018 Last Medication intake:  Ran out of medicine more than 48 hours ago  Cabazon ;dose was 3 days ago.

## 2016-03-18 ENCOUNTER — Encounter: Payer: Self-pay | Admitting: Pain Medicine

## 2016-03-18 ENCOUNTER — Ambulatory Visit
Admission: RE | Admit: 2016-03-18 | Discharge: 2016-03-18 | Disposition: A | Discharge status: Home / Self Care | Payer: Medicaid Other | Source: Ambulatory Visit | Attending: Pain Medicine | Admitting: Pain Medicine

## 2016-03-18 ENCOUNTER — Ambulatory Visit (HOSPITAL_BASED_OUTPATIENT_CLINIC_OR_DEPARTMENT_OTHER): Payer: Medicaid Other | Admitting: Pain Medicine

## 2016-03-18 VITALS — BP 118/76 | HR 72 | Temp 98.3°F | Resp 15 | Ht 63.0 in | Wt 145.0 lb

## 2016-03-18 DIAGNOSIS — M542 Cervicalgia: Secondary | ICD-10-CM

## 2016-03-18 DIAGNOSIS — F17210 Nicotine dependence, cigarettes, uncomplicated: Secondary | ICD-10-CM | POA: Diagnosis not present

## 2016-03-18 DIAGNOSIS — Z79899 Other long term (current) drug therapy: Secondary | ICD-10-CM | POA: Insufficient documentation

## 2016-03-18 DIAGNOSIS — M5412 Radiculopathy, cervical region: Secondary | ICD-10-CM | POA: Insufficient documentation

## 2016-03-18 DIAGNOSIS — M25511 Pain in right shoulder: Secondary | ICD-10-CM

## 2016-03-18 DIAGNOSIS — G8929 Other chronic pain: Secondary | ICD-10-CM

## 2016-03-18 DIAGNOSIS — M25512 Pain in left shoulder: Secondary | ICD-10-CM

## 2016-03-18 MED ORDER — ROPIVACAINE HCL 2 MG/ML IJ SOLN
INTRAMUSCULAR | Status: AC
  Filled 2016-03-18: qty 20

## 2016-03-18 MED ORDER — MIDAZOLAM HCL 5 MG/5ML IJ SOLN
1.0000 mg | INTRAMUSCULAR | Status: DC | PRN
  Administered 2016-03-18: 2 mg via INTRAVENOUS

## 2016-03-18 MED ORDER — LACTATED RINGERS IV SOLN
1000.0000 mL | Freq: Once | INTRAVENOUS | Status: AC
  Administered 2016-03-18: 1000 mL via INTRAVENOUS

## 2016-03-18 MED ORDER — FENTANYL CITRATE (PF) 100 MCG/2ML IJ SOLN
INTRAMUSCULAR | Status: AC
  Filled 2016-03-18: qty 2

## 2016-03-18 MED ORDER — LIDOCAINE HCL (PF) 1 % IJ SOLN
10.0000 mL | Freq: Once | INTRAMUSCULAR | Status: AC
  Administered 2016-03-18: 5 mL

## 2016-03-18 MED ORDER — MIDAZOLAM HCL 5 MG/5ML IJ SOLN
INTRAMUSCULAR | Status: AC
  Filled 2016-03-18: qty 5

## 2016-03-18 MED ORDER — SODIUM CHLORIDE 0.9% FLUSH: 1.0000 mL | Freq: Once | INTRAVENOUS | Status: DC

## 2016-03-18 MED ORDER — SODIUM CHLORIDE 0.9 % IJ SOLN
INTRAMUSCULAR | Status: AC
  Filled 2016-03-18: qty 10

## 2016-03-18 MED ORDER — DEXAMETHASONE SODIUM PHOSPHATE 4 MG/ML IJ SOLN
10.0000 mg | Freq: Once | INTRAMUSCULAR | Status: AC
  Administered 2016-03-18: 10 mg

## 2016-03-18 MED ORDER — ROPIVACAINE HCL 5 MG/ML IJ SOLN: 0.5000 mL | Freq: Once | INTRAMUSCULAR | Status: DC

## 2016-03-18 MED ORDER — DEXAMETHASONE SODIUM PHOSPHATE 4 MG/ML IJ SOLN
INTRAMUSCULAR | Status: AC
  Filled 2016-03-18: qty 3

## 2016-03-18 MED ORDER — IOPAMIDOL (ISOVUE-M 200) INJECTION 41%
10.0000 mL | Freq: Once | INTRAMUSCULAR | Status: AC
  Administered 2016-03-18: 10 mL via EPIDURAL
  Filled 2016-03-18: qty 10

## 2016-03-18 MED ORDER — FENTANYL CITRATE (PF) 100 MCG/2ML IJ SOLN
25.0000 ug | INTRAMUSCULAR | Status: DC | PRN
  Administered 2016-03-18: 50 ug via INTRAVENOUS

## 2016-03-18 NOTE — Patient Instructions (Addendum)
Steps to Quit Smoking Smoking tobacco can be bad for your health. It can also affect almost every organ in your body. Smoking puts you and people around you at risk for many serious long-lasting (chronic) diseases. Quitting smoking is hard, but it is one of the best things that you can do for your health. It is never too late to quit. What are the benefits of quitting smoking? When you quit smoking, you lower your risk for getting serious diseases and conditions. They can include:  Lung cancer or lung disease.  Heart disease.  Stroke.  Heart attack.  Not being able to have children (infertility).  Weak bones (osteoporosis) and broken bones (fractures). If you have coughing, wheezing, and shortness of breath, those symptoms may get better when you quit. You may also get sick less often. If you are pregnant, quitting smoking can help to lower your chances of having a baby of low birth weight. What can I do to help me quit smoking? Talk with your doctor about what can help you quit smoking. Some things you can do (strategies) include:  Quitting smoking totally, instead of slowly cutting back how much you smoke over a period of time.  Going to in-person counseling. You are more likely to quit if you go to many counseling sessions.  Using resources and support systems, such as:  Online chats with a counselor.  Phone quitlines.  Printed self-help materials.  Support groups or group counseling.  Text messaging programs.  Mobile phone apps or applications.  Taking medicines. Some of these medicines may have nicotine in them. If you are pregnant or breastfeeding, do not take any medicines to quit smoking unless your doctor says it is okay. Talk with your doctor about counseling or other things that can help you. Talk with your doctor about using more than one strategy at the same time, such as taking medicines while you are also going to in-person counseling. This can help make quitting  easier. What things can I do to make it easier to quit? Quitting smoking might feel very hard at first, but there is a lot that you can do to make it easier. Take these steps:  Talk to your family and friends. Ask them to support and encourage you.  Call phone quitlines, reach out to support groups, or work with a counselor.  Ask people who smoke to not smoke around you.  Avoid places that make you want (trigger) to smoke, such as:  Bars.  Parties.  Smoke-break areas at work.  Spend time with people who do not smoke.  Lower the stress in your life. Stress can make you want to smoke. Try these things to help your stress:  Getting regular exercise.  Deep-breathing exercises.  Yoga.  Meditating.  Doing a body scan. To do this, close your eyes, focus on one area of your body at a time from head to toe, and notice which parts of your body are tense. Try to relax the muscles in those areas.  Download or buy apps on your mobile phone or tablet that can help you stick to your quit plan. There are many free apps, such as QuitGuide from the CDC (Centers for Disease Control and Prevention). You can find more support from smokefree.gov and other websites. This information is not intended to replace advice given to you by your health care provider. Make sure you discuss any questions you have with your health care provider. Document Released: 11/03/2008 Document Revised: 09/05/2015 Document   Reviewed: 05/24/2014 Elsevier Interactive Patient Education  2017 [employer]. Pain Management Discharge Instructions  General Discharge Instructions :  If you need to reach your doctor call: Monday-Friday 8:00 am - 4:00 pm at [phone] or toll free [phone].  After clinic hours [phone] to have operator reach doctor.  Bring all of your medication bottles to all your appointments in the pain clinic.  To cancel or reschedule your appointment with Pain Management please remember to call  24 hours in advance to avoid a fee.  Refer to the educational materials which you have been given on: General Risks, I had my Procedure. Discharge Instructions, Post Sedation.  Post Procedure Instructions:  The drugs you were given will stay in your system until tomorrow, so for the next 24 hours you should not drive, make any legal decisions or drink any alcoholic beverages.  You may eat anything you prefer, but it is better to start with liquids then soups and crackers, and gradually work up to solid foods.  Please notify your doctor immediately if you have any unusual bleeding, trouble breathing or pain that is not related to your normal pain.  Depending on the type of procedure that was done, some parts of your body may feel week and/or numb.  This usually clears up by tonight or the next day.  Walk with the use of an assistive device or accompanied by an adult for the 24 hours.  You may use ice on the affected area for the first 24 hours.  Put ice in a Ziploc bag and cover with a towel and place against area 15 minutes on 15 minutes off.  You may switch to heat after 24 hours. Epidural Steroid Injection An epidural steroid injection is a shot of steroid medicine and numbing medicine that is given into the space between the spinal cord and the bones in your back (epidural space). The shot helps relieve pain caused by an irritated or swollen nerve root. The amount of pain relief you get from the injection depends on what is causing the nerve to be swollen and irritated, and how long your pain lasts. You are more likely to benefit from this injection if your pain is strong and comes on suddenly rather than if you have had pain for a long time. Tell a health care provider about:  Any allergies you have.  All medicines you are taking, including vitamins, herbs, eye drops, creams, and over-the-counter medicines.  Any problems you or family members have had with anesthetic medicines.  Any  blood disorders you have.  Any surgeries you have had.  Any medical conditions you have.  Whether you are pregnant or may be pregnant. What are the risks? Generally, this is a safe procedure. However, problems may occur, including:  Headache.  Bleeding.  Infection.  Allergic reaction to medicines.  Damage to your nerves. What happens before the procedure? Staying hydrated  Follow instructions from your health care provider about hydration, which may include:  Up to 2 hours before the procedure - you may continue to drink clear liquids, such as water, clear fruit juice, black coffee, and plain tea. Eating and drinking restrictions  Follow instructions from your health care provider about eating and drinking, which may include:  8 hours before the procedure - stop eating heavy meals or foods such as meat, fried foods, or fatty foods.  6 hours before the procedure - stop eating light meals or foods, such as toast or cereal.  6 hours before  the procedure - stop drinking milk or drinks that contain milk.  2 hours before the procedure - stop drinking clear liquids. Medicine  You may be given medicines to lower anxiety.  Ask your health care provider about:  Changing or stopping your regular medicines. This is especially important if you are taking diabetes medicines or blood thinners.  Taking medicines such as aspirin and ibuprofen. These medicines can thin your blood. Do not take these medicines before your procedure if your health care provider instructs you not to. General instructions  Plan to have someone take you home from the hospital or clinic. What happens during the procedure?  You may receive a medicine to help you relax (sedative).  You will be asked to lie on your abdomen.  The injection site will be cleaned.  A numbing medicine (local anesthetic) will be used to numb the injection site.  A needle will be inserted through your skin into the epidural  space. You may feel some discomfort when this happens. An X-ray machine will be used to make sure the needle is put as close as possible to the affected nerve.  A steroid medicine and a local anesthetic will be injected into the epidural space.  The needle will be removed.  A bandage (dressing) will be put over the injection site. What happens after the procedure?  Your blood pressure, heart rate, breathing rate, and blood oxygen level will be monitored until the medicines you were given have worn off.  Your arm or leg may feel weak or numb for a few hours.  The injection site may feel sore.  Do not drive for 24 hours if you received a sedative. This information is not intended to replace advice given to you by your health care provider. Make sure you discuss any questions you have with your health care provider. Document Released: 04/16/2007 Document Revised: 06/21/2015 Document Reviewed: 04/25/2015 Elsevier Interactive Patient Education  2017 [employer].

## 2016-03-18 NOTE — Progress Notes (Signed)
Safety precautions to be maintained throughout the outpatient stay will include: orient to surroundings, keep bed in low position, maintain call bell within reach at all times, provide assistance with transfer out of bed and ambulation.  

## 2016-03-18 NOTE — Progress Notes (Signed)
Patient's Name: Tanya [name]  MRN: [id number]  Referring Provider: Milinda Pointer, MD  DOB: 26-Dec-1960  PCP: Ricardo Jericho, NP  DOS: 03/18/2016  Note by: Kathlen Brunswick. Dossie Arbour, MD  Service setting: Ambulatory outpatient  Location: [facility] (AMB) Pain Management Facility  Visit type: Procedure  Specialty: Interventional Pain Management  Patient type: Established   Primary Reason for Visit: Interventional Pain Management Treatment. CC: Neck Pain (left)  Procedure:  Anesthesia, Analgesia, Anxiolysis:  Type: Therapeutic, Inter-Laminar, Epidural Steroid Injection Region: Posterior Cervico-thoracic Region Level: C7-T1 Laterality: Left-Sided Paramedial (the patient was originally scheduled to come in for a right sided procedure, but today she indicates that her right side is doing well and she would like to have the left side done today.)   Type: Local Anesthesia with Moderate (Conscious) Sedation Local Anesthetic: Lidocaine 1% Route: Intravenous (IV) IV Access: Secured Sedation: Meaningful verbal contact was maintained at all times during the procedure  Indication(s): Analgesia and Anxiety  Indications: 1. Chronic cervical radicular pain (Right)   2. Chronic shoulder pain (Location of Primary Source of Pain) (Bilateral) (R>L)   3. Chronic neck pain (Location of Secondary source of pain) (Bilateral) (R>L)    Pain Score: Pre-procedure: 3 /10 Post-procedure: 0-No pain/10  Pre-op Assessment:  Previous date of service: 03/07/16 Service provided: Med Refill, Evaluation Tanya [name] is a 56 y.o. (year old), female patient, seen today for interventional treatment. She  has a past surgical history that includes Abdominal hysterectomy. Her primarily concern today is the Neck Pain (left)  Initial Vital Signs: Blood pressure 127/61, pulse 79, temperature (!) 96.2 F (35.7 C), resp. rate 20, height 5\' 3"  (1.6 m), weight 145 lb (65.8 kg), SpO2 100 %. BMI: 25.69 kg/m  Risk  Assessment: Allergies: Reviewed. She is allergic to methocarbamol; trazodone and nefazodone; bupropion; oxycodone-acetaminophen; and tramadol.  Allergy Precautions: None required Coagulopathies: "Reviewed. None identified.  Blood-thinner therapy: None at this time Active Infection(s): Reviewed. None identified. Tanya [name] is afebrile  Site Confirmation: Tanya [name] was asked to confirm the procedure and laterality before marking the site Procedure checklist: Completed Consent: Before the procedure and under the influence of no sedative(s), amnesic(s), or anxiolytics, the patient was informed of the treatment options, risks and possible complications. To fulfill our ethical and legal obligations, as recommended by the American Medical Association's Code of Ethics, I have informed the patient of my clinical impression; the nature and purpose of the treatment or procedure; the risks, benefits, and possible complications of the intervention; the alternatives, including doing nothing; the risk(s) and benefit(s) of the alternative treatment(s) or procedure(s); and the risk(s) and benefit(s) of doing nothing. The patient was provided information about the general risks and possible complications associated with the procedure. These may include, but are not limited to: failure to achieve desired goals, infection, bleeding, organ or nerve damage, allergic reactions, paralysis, and death. In addition, the patient was informed of those risks and complications associated to Spine-related procedures, such as failure to decrease pain; infection (i.e.: Meningitis, epidural or intraspinal abscess); bleeding (i.e.: epidural hematoma, subarachnoid hemorrhage, or any other type of intraspinal or peri-dural bleeding); organ or nerve damage (i.e.: Any type of peripheral nerve, nerve root, or spinal cord injury) with subsequent damage to sensory, motor, and/or autonomic systems, resulting in permanent pain, numbness,  and/or weakness of one or several areas of the body; allergic reactions; (i.e.: anaphylactic reaction); and/or death. Furthermore, the patient was informed of those risks and complications associated with the medications. These include, but are  not limited to: allergic reactions (i.e.: anaphylactic or anaphylactoid reaction(s)); adrenal axis suppression; blood sugar elevation that in diabetics may result in ketoacidosis or comma; water retention that in patients with history of congestive heart failure may result in shortness of breath, pulmonary edema, and decompensation with resultant heart failure; weight gain; swelling or edema; medication-induced neural toxicity; particulate matter embolism and blood vessel occlusion with resultant organ, and/or nervous system infarction; and/or aseptic necrosis of one or more joints. Finally, the patient was informed that Medicine is not an exact science; therefore, there is also the possibility of unforeseen or unpredictable risks and/or possible complications that may result in a catastrophic outcome. The patient indicated having understood very clearly. We have given the patient no guarantees and we have made no promises. Enough time was given to the patient to ask questions, all of which were answered to the patient's satisfaction. Tanya [name] has indicated that she wanted to continue with the procedure. Attestation: I, the ordering provider, attest that I have discussed with the patient the benefits, risks, side-effects, alternatives, likelihood of achieving goals, and potential problems during recovery for the procedure that I have provided informed consent. Date: 03/18/2016; Time: 8:08 AM  Pre-Procedure Preparation:  Monitoring: As per clinic protocol. Respiration, ETCO2, SpO2, BP, heart rate and rhythm monitor placed and checked for adequate function Safety Precautions: Patient was assessed for positional comfort and pressure points before starting the  procedure. Time-out: I initiated and conducted the "Time-out" before starting the procedure, as per protocol. The patient was asked to participate by confirming the accuracy of the "Time Out" information. Verification of the correct person, site, and procedure were performed and confirmed by me, the nursing staff, and the patient. "Time-out" conducted as per Joint Commission's Universal Protocol (UP.01.01.01). "Time-out" Date & Time: 03/18/2016; 0846 hrs.  Description of Procedure Process:   Position: Prone with head of the table was raised to facilitate breathing. Target Area: For Epidural Steroid injections the target is the interlaminar space, initially targeting the lower border of the superior vertebral body lamina. Approach: Paramedial approach. Area Prepped: Entire PosteriorCervical Region Prepping solution: ChloraPrep (2% chlorhexidine gluconate and 70% isopropyl alcohol) Safety Precautions: Aspiration looking for blood return was conducted prior to all injections. At no point did we inject any substances, as a needle was being advanced. No attempts were made at seeking any paresthesias. Safe injection practices and needle disposal techniques used. Medications properly checked for expiration dates. SDV (single dose vial) medications used. Description of the Procedure: Protocol guidelines were followed. The procedure needle was introduced through the skin, ipsilateral to the reported pain, and advanced to the target area. Bone was contacted and the needle walked caudad, until the lamina was cleared. The epidural space was identified using "loss-of-resistance technique" with 2-3 ml of PF-NaCl (0.9% NSS), in a 5cc LOR glass syringe. Vitals:   03/18/16 0856 03/18/16 0906 03/18/16 0916 03/18/16 0926  BP: 125/86 129/72 123/78 118/76  Pulse:  70 65 72  Resp: 16 16 16 15   Temp: 98.2 F (36.8 C)   98.3 F (36.8 C)  SpO2: 98% 99% 99% 98%  Weight:      Height:        Start Time: 0847 hrs. End  Time: 0853 hrs. Materials:  Needle(s) Type: Epidural needle Gauge: 17G Length: 3.5-in Medication(s): We administered fentaNYL, lactated ringers, midazolam, dexamethasone, iopamidol, and lidocaine (PF). Please see chart orders for dosing details.  Imaging Guidance (Spinal):  Type of Imaging Technique: Fluoroscopy Guidance (Spinal) Indication(s): Assistance  in needle guidance and placement for procedures requiring needle placement in or near specific anatomical locations not easily accessible without such assistance. Exposure Time: Please see nurses notes. Contrast: Before injecting any contrast, we confirmed that the patient did not have an allergy to iodine, shellfish, or radiological contrast. Once satisfactory needle placement was completed at the desired level, radiological contrast was injected. Contrast injected under live fluoroscopy. No contrast complications. See chart for type and volume of contrast used. Fluoroscopic Guidance: I was personally present during the use of fluoroscopy. "Tunnel Vision Technique" used to obtain the best possible view of the target area. Parallax error corrected before commencing the procedure. "Direction-depth-direction" technique used to introduce the needle under continuous pulsed fluoroscopy. Once target was reached, antero-posterior, oblique, and lateral fluoroscopic projection used confirm needle placement in all planes. Images permanently stored in EMR. Interpretation: I personally interpreted the imaging intraoperatively. Adequate needle placement confirmed in multiple planes. Appropriate spread of contrast into desired area was observed. No evidence of afferent or efferent intravascular uptake. No intrathecal or subarachnoid spread observed. Permanent images saved into the patient's record.  Antibiotic Prophylaxis:  Indication(s): None identified Antibiotic given: None  Post-operative Assessment:  EBL: None Complications: No immediate post-treatment  complications observed by team, or reported by patient. Note: The patient tolerated the entire procedure well. A repeat set of vitals were taken after the procedure and the patient was kept under observation following institutional policy, for this type of procedure. Post-procedural neurological assessment was performed, showing return to baseline, prior to discharge. The patient was provided with post-procedure discharge instructions, including a section on how to identify potential problems. Should any problems arise concerning this procedure, the patient was given instructions to immediately contact us, at any time, without hesitation. In any case, we plan to contact the patient by telephone for a follow-up status report regarding this interventional procedure. Comments:  No additional relevant information.  Plan of Care  Disposition: Discharge home  Discharge Date & Time: 03/18/2016; 0930 hrs.  Physician-requested Follow-up:  Return in about 2 weeks (around 04/01/2016) for Post-Procedure evaluation.  Future Appointments Date Time Provider [place]  04/15/2016 1:00 PM Milinda Pointer, MD [facility] None   Medications ordered for procedure: Meds ordered this encounter  Medications  . fentaNYL (SUBLIMAZE) injection 25-50 mcg    Make sure Narcan is available in the pyxis when using this medication. In the event of respiratory depression (RR< 8/min): Titrate NARCAN (naloxone) in increments of 0.1 to 0.2 mg IV at 2-3 minute intervals, until desired degree of reversal.  . lactated ringers infusion 1,000 mL  . midazolam (VERSED) 5 MG/5ML injection 1-2 mg    Make sure Flumazenil is available in the pyxis when using this medication. If oversedation occurs, administer 0.2 mg IV over 15 sec. If after 45 sec no response, administer 0.2 mg again over 1 min; may repeat at 1 min intervals; not to exceed 4 doses (1 mg)  . dexamethasone (DECADRON) injection 10 mg  . iopamidol (ISOVUE-M) 41 %  intrathecal injection 10 mL  . lidocaine (PF) (XYLOCAINE) 1 % injection 10 mL  . sodium chloride flush (NS) 0.9 % injection 1 mL  . ropivacaine (PF) 5 mg/mL (0.5%) (NAROPIN) injection 0.5 mL   Medications administered: We administered fentaNYL, lactated ringers, midazolam, dexamethasone, iopamidol, and lidocaine (PF).  See the medical record for exact dosing, route, and time of administration.  Lab-work, Procedure(s), & Referral(s) Ordered: Orders Placed This Encounter  Procedures  . DG C-Arm 1-60 Min-No Report  .  Discharge instructions  . Follow-up  . Provider attestation of informed consent for procedure/surgical case  . Verify informed consent  . Informed Consent Details: Transcribe to consent form and obtain patient signature   Imaging Ordered: Results for orders placed during the hospital encounter of 02/12/16  DG C-Arm 1-60 Min-No Report   Narrative There is no Radiologist interpretation  for this exam.   New Prescriptions   No medications on file   Primary Care Physician: Ricardo Jericho, NP Location: [facility] Note by: Kathlen Brunswick. Dossie Arbour, M.D, DABA, DABAPM, DABPM, DABIPP, FIPP Date: 03/18/2016; Time: 10:20 AM  Disclaimer:  Medicine is not an Chief Strategy Officer. The only guarantee in medicine is that nothing is guaranteed. It is important to note that the decision to proceed with this intervention was based on the information collected from the patient. The Data and conclusions were drawn from the patient's questionnaire, the interview, and the physical examination. Because the information was provided in large part by the patient, it cannot be guaranteed that it has not been purposely or unconsciously manipulated. Every effort has been made to obtain as much relevant data as possible for this evaluation. It is important to note that the conclusions that lead to this procedure are derived in large part from the available data. Always take into  account that the treatment will also be dependent on availability of resources and existing treatment guidelines, considered by other Pain Management Practitioners as being common knowledge and practice, at the time of the intervention. For Medico-Legal purposes, it is also important to point out that variation in procedural techniques and pharmacological choices are the acceptable norm. The indications, contraindications, technique, and results of the above procedure should only be interpreted and judged by a Board-Certified Interventional Pain Specialist with extensive familiarity and expertise in the same exact procedure and technique. Attempts at providing opinions without similar or greater experience and expertise than that of the treating physician will be considered as inappropriate and unethical, and shall result in a formal complaint to the state medical board and applicable specialty societies.  Instructions provided at this appointment: Patient Instructions  Steps to Quit Smoking Smoking tobacco can be bad for your health. It can also affect almost every organ in your body. Smoking puts you and people around you at risk for many serious long-lasting (chronic) diseases. Quitting smoking is hard, but it is one of the best things that you can do for your health. It is never too late to quit. What are the benefits of quitting smoking? When you quit smoking, you lower your risk for getting serious diseases and conditions. They can include:  Lung cancer or lung disease.  Heart disease.  Stroke.  Heart attack.  Not being able to have children (infertility).  Weak bones (osteoporosis) and broken bones (fractures). If you have coughing, wheezing, and shortness of breath, those symptoms may get better when you quit. You may also get sick less often. If you are pregnant, quitting smoking can help to lower your chances of having a baby of low birth weight. What can I do to help me quit  smoking? Talk with your doctor about what can help you quit smoking. Some things you can do (strategies) include:  Quitting smoking totally, instead of slowly cutting back how much you smoke over a period of time.  Going to in-person counseling. You are more likely to quit if you go to many counseling sessions.  Using resources and support systems, such  as:  Online chats with a Social worker.  Phone quitlines.  Printed Furniture conservator/restorer.  Support groups or group counseling.  Text messaging programs.  Mobile phone apps or applications.  Taking medicines. Some of these medicines may have nicotine in them. If you are pregnant or breastfeeding, do not take any medicines to quit smoking unless your doctor says it is okay. Talk with your doctor about counseling or other things that can help you. Talk with your doctor about using more than one strategy at the same time, such as taking medicines while you are also going to in-person counseling. This can help make quitting easier. What things can I do to make it easier to quit? Quitting smoking might feel very hard at first, but there is a lot that you can do to make it easier. Take these steps:  Talk to your family and friends. Ask them to support and encourage you.  Call phone quitlines, reach out to support groups, or work with a Social worker.  Ask people who smoke to not smoke around you.  Avoid places that make you want (trigger) to smoke, such as:  Bars.  Parties.  Smoke-break areas at work.  Spend time with people who do not smoke.  Lower the stress in your life. Stress can make you want to smoke. Try these things to help your stress:  Getting regular exercise.  Deep-breathing exercises.  Yoga.  Meditating.  Doing a body scan. To do this, close your eyes, focus on one area of your body at a time from head to toe, and notice which parts of your body are tense. Try to relax the muscles in those areas.  Download or buy  apps on your mobile phone or tablet that can help you stick to your quit plan. There are many free apps, such as QuitGuide from the [employer] Office manager for Disease Control and Prevention). You can find more support from smokefree.gov and other websites. This information is not intended to replace advice given to you by your health care provider. Make sure you discuss any questions you have with your health care provider. Document Released: 11/03/2008 Document Revised: 09/05/2015 Document Reviewed: 05/24/2014 Elsevier Interactive Patient Education  2017 [employer]. Pain Management Discharge Instructions  General Discharge Instructions :  If you need to reach your doctor call: Monday-Friday 8:00 am - 4:00 pm at [phone] or toll free [phone].  After clinic hours [phone] to have operator reach doctor.  Bring all of your medication bottles to all your appointments in the pain clinic.  To cancel or reschedule your appointment with Pain Management please remember to call 24 hours in advance to avoid a fee.  Refer to the educational materials which you have been given on: General Risks, I had my Procedure. Discharge Instructions, Post Sedation.  Post Procedure Instructions:  The drugs you were given will stay in your system until tomorrow, so for the next 24 hours you should not drive, make any legal decisions or drink any alcoholic beverages.  You may eat anything you prefer, but it is better to start with liquids then soups and crackers, and gradually work up to solid foods.  Please notify your doctor immediately if you have any unusual bleeding, trouble breathing or pain that is not related to your normal pain.  Depending on the type of procedure that was done, some parts of your body may feel week and/or numb.  This usually clears up by tonight or the next day.  Walk with the use  of an assistive device or accompanied by an adult for the 24 hours.  You may use ice on the affected  area for the first 24 hours.  Put ice in a Ziploc bag and cover with a towel and place against area 15 minutes on 15 minutes off.  You may switch to heat after 24 hours. Epidural Steroid Injection An epidural steroid injection is a shot of steroid medicine and numbing medicine that is given into the space between the spinal cord and the bones in your back (epidural space). The shot helps relieve pain caused by an irritated or swollen nerve root. The amount of pain relief you get from the injection depends on what is causing the nerve to be swollen and irritated, and how long your pain lasts. You are more likely to benefit from this injection if your pain is strong and comes on suddenly rather than if you have had pain for a long time. Tell a health care provider about:  Any allergies you have.  All medicines you are taking, including vitamins, herbs, eye drops, creams, and over-the-counter medicines.  Any problems you or family members have had with anesthetic medicines.  Any blood disorders you have.  Any surgeries you have had.  Any medical conditions you have.  Whether you are pregnant or may be pregnant. What are the risks? Generally, this is a safe procedure. However, problems may occur, including:  Headache.  Bleeding.  Infection.  Allergic reaction to medicines.  Damage to your nerves. What happens before the procedure? Staying hydrated  Follow instructions from your health care provider about hydration, which may include:  Up to 2 hours before the procedure - you may continue to drink clear liquids, such as water, clear fruit juice, black coffee, and plain tea. Eating and drinking restrictions  Follow instructions from your health care provider about eating and drinking, which may include:  8 hours before the procedure - stop eating heavy meals or foods such as meat, fried foods, or fatty foods.  6 hours before the procedure - stop eating light meals or foods, such as  toast or cereal.  6 hours before the procedure - stop drinking milk or drinks that contain milk.  2 hours before the procedure - stop drinking clear liquids. Medicine  You may be given medicines to lower anxiety.  Ask your health care provider about:  Changing or stopping your regular medicines. This is especially important if you are taking diabetes medicines or blood thinners.  Taking medicines such as aspirin and ibuprofen. These medicines can thin your blood. Do not take these medicines before your procedure if your health care provider instructs you not to. General instructions  Plan to have someone take you home from the hospital or clinic. What happens during the procedure?  You may receive a medicine to help you relax (sedative).  You will be asked to lie on your abdomen.  The injection site will be cleaned.  A numbing medicine (local anesthetic) will be used to numb the injection site.  A needle will be inserted through your skin into the epidural space. You may feel some discomfort when this happens. An X-ray machine will be used to make sure the needle is put as close as possible to the affected nerve.  A steroid medicine and a local anesthetic will be injected into the epidural space.  The needle will be removed.  A bandage (dressing) will be put over the injection site. What happens after the procedure?  Your blood pressure, heart rate, breathing rate, and blood oxygen level will be monitored until the medicines you were given have worn off.  Your arm or leg may feel weak or numb for a few hours.  The injection site may feel sore.  Do not drive for 24 hours if you received a sedative. This information is not intended to replace advice given to you by your health care provider. Make sure you discuss any questions you have with your health care provider. Document Released: 04/16/2007 Document Revised: 06/21/2015 Document Reviewed: 04/25/2015 Elsevier  Interactive Patient Education  2017 [employer].

## 2016-03-19 ENCOUNTER — Telehealth: Payer: Self-pay | Admitting: *Deleted

## 2016-03-19 NOTE — Telephone Encounter (Signed)
Patient verbalizes no questions or concerns re; procedure on yesterday. 

## 2016-04-15 ENCOUNTER — Ambulatory Visit: Payer: Medicaid Other | Attending: Pain Medicine | Admitting: Pain Medicine

## 2016-04-15 ENCOUNTER — Encounter: Payer: Self-pay | Admitting: Pain Medicine

## 2016-04-15 VITALS — BP 132/78 | HR 91 | Temp 97.7°F | Ht 63.0 in | Wt 153.0 lb

## 2016-04-15 DIAGNOSIS — Z90710 Acquired absence of both cervix and uterus: Secondary | ICD-10-CM | POA: Insufficient documentation

## 2016-04-15 DIAGNOSIS — Z823 Family history of stroke: Secondary | ICD-10-CM | POA: Diagnosis not present

## 2016-04-15 DIAGNOSIS — F1190 Opioid use, unspecified, uncomplicated: Secondary | ICD-10-CM

## 2016-04-15 DIAGNOSIS — G894 Chronic pain syndrome: Secondary | ICD-10-CM | POA: Insufficient documentation

## 2016-04-15 DIAGNOSIS — M47892 Other spondylosis, cervical region: Secondary | ICD-10-CM | POA: Diagnosis not present

## 2016-04-15 DIAGNOSIS — M47812 Spondylosis without myelopathy or radiculopathy, cervical region: Secondary | ICD-10-CM | POA: Diagnosis not present

## 2016-04-15 DIAGNOSIS — M791 Myalgia: Secondary | ICD-10-CM | POA: Diagnosis not present

## 2016-04-15 DIAGNOSIS — M25512 Pain in left shoulder: Secondary | ICD-10-CM | POA: Diagnosis not present

## 2016-04-15 DIAGNOSIS — E7800 Pure hypercholesterolemia, unspecified: Secondary | ICD-10-CM | POA: Insufficient documentation

## 2016-04-15 DIAGNOSIS — M25511 Pain in right shoulder: Secondary | ICD-10-CM | POA: Insufficient documentation

## 2016-04-15 DIAGNOSIS — M545 Low back pain: Secondary | ICD-10-CM | POA: Diagnosis not present

## 2016-04-15 DIAGNOSIS — M542 Cervicalgia: Secondary | ICD-10-CM | POA: Diagnosis not present

## 2016-04-15 DIAGNOSIS — M150 Primary generalized (osteo)arthritis: Secondary | ICD-10-CM | POA: Diagnosis not present

## 2016-04-15 DIAGNOSIS — G8929 Other chronic pain: Secondary | ICD-10-CM

## 2016-04-15 DIAGNOSIS — Z8249 Family history of ischemic heart disease and other diseases of the circulatory system: Secondary | ICD-10-CM | POA: Insufficient documentation

## 2016-04-15 DIAGNOSIS — Z809 Family history of malignant neoplasm, unspecified: Secondary | ICD-10-CM | POA: Insufficient documentation

## 2016-04-15 DIAGNOSIS — M1288 Other specific arthropathies, not elsewhere classified, other specified site: Secondary | ICD-10-CM

## 2016-04-15 DIAGNOSIS — Z833 Family history of diabetes mellitus: Secondary | ICD-10-CM | POA: Insufficient documentation

## 2016-04-15 DIAGNOSIS — M159 Polyosteoarthritis, unspecified: Secondary | ICD-10-CM

## 2016-04-15 DIAGNOSIS — F411 Generalized anxiety disorder: Secondary | ICD-10-CM | POA: Diagnosis not present

## 2016-04-15 DIAGNOSIS — M1991 Primary osteoarthritis, unspecified site: Secondary | ICD-10-CM | POA: Insufficient documentation

## 2016-04-15 DIAGNOSIS — Z5181 Encounter for therapeutic drug level monitoring: Secondary | ICD-10-CM | POA: Diagnosis present

## 2016-04-15 DIAGNOSIS — M5412 Radiculopathy, cervical region: Secondary | ICD-10-CM

## 2016-04-15 DIAGNOSIS — M797 Fibromyalgia: Secondary | ICD-10-CM | POA: Diagnosis not present

## 2016-04-15 DIAGNOSIS — M549 Dorsalgia, unspecified: Secondary | ICD-10-CM | POA: Diagnosis not present

## 2016-04-15 DIAGNOSIS — Z79891 Long term (current) use of opiate analgesic: Secondary | ICD-10-CM | POA: Diagnosis not present

## 2016-04-15 DIAGNOSIS — M7918 Myalgia, other site: Secondary | ICD-10-CM

## 2016-04-15 DIAGNOSIS — F17210 Nicotine dependence, cigarettes, uncomplicated: Secondary | ICD-10-CM | POA: Diagnosis not present

## 2016-04-15 MED ORDER — HYDROCODONE-ACETAMINOPHEN 5-325 MG PO TABS: 1.0000 | ORAL_TABLET | Freq: Two times a day (BID) | ORAL | 0 refills | Status: DC | PRN

## 2016-04-15 MED ORDER — HYDROCODONE-ACETAMINOPHEN 5-325 MG PO TABS: 1.0000 | ORAL_TABLET | Freq: Two times a day (BID) | ORAL | 0 refills | Status: DC

## 2016-04-15 MED ORDER — GABAPENTIN 300 MG PO CAPS: 300.0000 mg | ORAL_CAPSULE | Freq: Three times a day (TID) | ORAL | 0 refills | Status: DC

## 2016-04-15 MED ORDER — MELOXICAM 15 MG PO TABS: 15.0000 mg | ORAL_TABLET | Freq: Every day | ORAL | 0 refills | Status: DC

## 2016-04-15 MED ORDER — CYCLOBENZAPRINE HCL 10 MG PO TABS: 10.0000 mg | ORAL_TABLET | Freq: Three times a day (TID) | ORAL | 2 refills | Status: DC | PRN

## 2016-04-15 NOTE — Progress Notes (Signed)
Nursing Pain Medication Assessment:  Safety precautions to be maintained throughout the outpatient stay will include: orient to surroundings, keep bed in low position, maintain call bell within reach at all times, provide assistance with transfer out of bed and ambulation.  Medication Inspection Compliance: Pill count conducted under aseptic conditions, in front of the patient. Neither the pills nor the bottle was removed from the patient's sight at any time. Once count was completed pills were immediately returned to the patient in their original bottle.  Medication: Hydrocodone/APAP Pill/Patch Count: 0 of 60 pills remain Bottle Appearance: Standard pharmacy container. Clearly labeled. Filled [id number] 02/15 Last Medication intake:  Ran out of medicine more than 48 hours ago

## 2016-04-15 NOTE — Progress Notes (Signed)
Nursing Pain Medication Assessment:  Safety precautions to be maintained throughout the outpatient stay will include: orient to surroundings, keep bed in low position, maintain call bell within reach at all times, provide assistance with transfer out of bed and ambulation.  Medication Inspection Compliance: Tanya [name] did not comply with our request to bring her pills to be counted. She was reminded that bringing the medication bottles, even when empty, is a requirement. Pill/Patch Count: None available to be counted. Bottle Appearance: No container available. Did not bring bottle(s) to appointment. Medication: None brought in. Filled Date: N/A Last Medication intake:  Ran out of medicine more than 48 hours ago 04/12/16 pm

## 2016-04-15 NOTE — Progress Notes (Signed)
Patient's Name: Tanya [name]  MRN: [id number]  Referring Provider: Ricardo Jericho*  DOB: 1960-05-09  PCP: Ricardo Jericho, NP  DOS: 04/15/2016  Note by: Kathlen Brunswick. Dossie Arbour, MD  Service setting: Ambulatory outpatient  Specialty: Interventional Pain Management  Location: [facility] (AMB) Pain Management Facility    Patient type: Established   Primary Reason(s) for Visit: Encounter for prescription drug management & post-procedure evaluation of chronic illness with mild to moderate exacerbation(Level of risk: moderate) CC: Neck Pain (mid)  HPI  Tanya [name] is a 56 y.o. year old, female patient, who comes today for a post-procedure evaluation and medication management. She has Anxiety; Chronic upper back pain (Location of Secondary source of pain) (Bilateral) (R>L); Chronic discoid lupus erythematosus; Chronic low back pain (Location of Tertiary source of pain) (Bilateral) (R>L); Chronic neck pain (Location of Primary Source of Pain) (Bilateral) (R>L); Discoid lupus; Diverticulitis; Encounter for long-term (current) use of high-risk medication; Fibromyalgia; Generalized anxiety disorder; Hypercholesterolemia; Hyperlipidemia; Major depressive disorder, recurrent ([place]); Pain medication agreement signed; Osteoarthritis; SLE (systemic lupus erythematosus) ([place]); Urinary incontinence; Long term prescription benzodiazepine use; Long term current use of opiate analgesic; Long term prescription opiate use; Opiate use (10 MME/Day); Chronic pain syndrome; Chronic upper extremity pain (Right); Chronic cervical radicular pain (Right); Chronic shoulder pain (Bilateral) (R>L); 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 Her primarily concern today is the Neck Pain (mid)  Pain Assessment: Self-Reported Pain Score: 4 /10  Clinically the patient looks like a 2/10 Reported level is inconsistent with clinical observations. Information on the proper use of the pain scale provided to the patient today Pain Descriptors / Indicators: Constant, Aching, Discomfort, Sore, Throbbing (Swells base of neck) Pain Frequency: Constant  Tanya [name] was last seen on 03/18/2016 for a procedure. During today's appointment we reviewed Tanya [name]'s post-procedure results, as well as her outpatient medication regimen. At this point, we have completed a series of 3 cervical epidural steroid injections. The first 2 were done on the right side and the third on the left. She indicates that the first 2 done on the right side did work a lot better than the one on the left. The pain is currently limited to the neck region. The shoulder pain is no longer her primary pain, in fact he has been gone since we started doing the cervical epidural steroid injections suggesting that this shoulder pain was radicular in nature. In addition, the patient's upper extremity symptoms are also gone. Today we have reviewed her x-rays and cervical MRI and based on her symptoms, it is my opinion that her pain is originating in the cervical facet joints. I will be bringing her back for a diagnostic bilateral cervical facet block under fluoroscopic guidance. If she gets good relief of the pain with this, she may be a good candidate for radiofrequency ablation.  Further details on both, my assessment(s), as well as the proposed treatment plan, please see below.  Controlled Substance Pharmacotherapy Assessment REMS (Risk Evaluation and Mitigation Strategy)  Analgesic:Hydrocodone/APAP 5/325 one tablet by mouth twice a day (10 mg/dayof hydrocodone) MME/day:93m/day Tanya Specking RN  04/15/2016  1:19 PM  Sign at close encounter Nursing Pain Medication Assessment:  Safety precautions to be maintained throughout the outpatient stay will include: orient to  surroundings, keep bed in low position, maintain call bell within reach at all times, provide assistance with transfer out of bed  and ambulation.  Medication Inspection Compliance: Tanya [name] did not comply with our request to bring her pills to be counted. She was reminded that bringing the medication bottles, even when empty, is a requirement. Pill/Patch Count: None available to be counted. Bottle Appearance: No container available. Did not bring bottle(s) to appointment. Medication: None brought in. Filled Date: N/A Last Medication intake:  Ran out of medicine more than 48 hours ago 04/12/16 pm   Pharmacokinetics: Liberation and absorption (onset of action): WNL Distribution (time to peak effect): WNL Metabolism and excretion (duration of action): WNL         Pharmacodynamics: Desired effects: Analgesia: Tanya [name] reports >50% benefit. Functional ability: Patient reports that medication allows her to accomplish basic ADLs Clinically meaningful improvement in function (CMIF): Sustained CMIF goals met Perceived effectiveness: Described as relatively effective, allowing for increase in activities of daily living (ADL) Undesirable effects: Side-effects or Adverse reactions: None reported Monitoring: [place] PMP: Online review of the past 70-monthperiod conducted. Compliant with practice rules and regulations List of all UDS test(s) done:  Lab Results  Component Value Date   SUMMARY FINAL 11/23/2015   Last UDS on record: No results found for: TOXASSSELUR UDS interpretation: Compliant          Medication Assessment Form: Reviewed. Patient indicates being compliant with therapy Treatment compliance: Compliant Risk Assessment Profile: Aberrant behavior: See prior evaluations. None observed or detected today Comorbid factors increasing risk of overdose: See prior notes. No additional risks detected today Risk of substance use disorder (SUD): Low Opioid Risk Tool (ORT) Total Score:  1  Interpretation Table:  Score <3 = Low Risk for SUD  Score between 4-7 = Moderate Risk for SUD  Score >8 = High Risk for Opioid Abuse   Risk Mitigation Strategies:  Patient Counseling: Covered Patient-Prescriber Agreement (PPA): Present and active  Notification to other healthcare providers: Done  Pharmacologic Plan: No change in therapy, at this time  Post-Procedure Assessment  03/18/2016 Procedure: Left cervical epidural steroid injection under fluoroscopic guidance and IV sedation Pre-procedure pain score:  3/10 Post-procedure pain score: 0/10 (100% relief) Influential Factors: BMI: 27.10 kg/m Intra-procedural challenges: None observed Assessment challenges: None detected         Post-procedural side-effects, adverse reactions, or complications: None reported Reported issues: None  Sedation: Sedation provided. When no sedatives are used, the analgesic levels obtained are directly associated to the effectiveness of the local anesthetics. However, when sedation is provided, the level of analgesia obtained during the initial 1 hour following the intervention, is believed to be the result of a combination of factors. These factors may include, but are not limited to: 1. The effectiveness of the local anesthetics used. 2. The effects of the analgesic(s) and/or anxiolytic(s) used. 3. The degree of discomfort experienced by the patient at the time of the procedure. 4. The patients ability and reliability in recalling and recording the events. 5. The presence and influence of possible secondary gains and/or psychosocial factors. Reported result: Relief experienced during the 1st hour after the procedure: 100 % (Ultra-Short Term Relief) Interpretative annotation: Analgesia during this period is likely to be Local Anesthetic and/or IV Sedative (Analgesic/Anxiolitic) related.          Effects of local anesthetic: The analgesic effects attained during this period are directly associated to  the localized infiltration of local anesthetics and therefore cary significant diagnostic value as to the etiological location, or anatomical origin, of the pain. Expected duration of relief is directly dependent on  the pharmacodynamics of the local anesthetic used. Long-acting (4-6 hours) anesthetics used.  Reported result: Relief during the next 4 to 6 hour after the procedure: 50 % (Short-Term Relief) Interpretative annotation: Complete relief would suggest area to be the source of the pain.          Long-term benefit: Defined as the period of time past the expected duration of local anesthetics. With the possible exception of prolonged sympathetic blockade from the local anesthetics, benefits during this period are typically attributed to, or associated with, other factors such as analgesic sensory neuropraxia, antiinflammatory effects, or beneficial biochemical changes provided by agents other than the local anesthetics Reported result: Extended relief following procedure: 0 % (states procedure did not feel like the first two) (Long-Term Relief) Interpretative annotation: No long-term benefit. This could suggest limited inflammatory component to the pain with possible mechanical aggravating factors.          Current benefits: Defined as persistent relief that continues at this point in time.   Reported results: Treated area: 0 %       The patient indicates that the cervical epidural steroid injections done on the right side seemed to have worked a lot better than the one done on the left. This would suggest that in terms of the pathology, this is worse on the right side. Interpretative annotation: Recurrance of symptoms. This would suggest persistent aggravating factors  Interpretation: Results would suggest a successful diagnostic intervention.          Laboratory Chemistry  Inflammation Markers Lab Results  Component Value Date   CRP <0.8 11/23/2015   ESRSEDRATE 8 11/23/2015   (CRP: Acute  Phase) (ESR: Chronic Phase) Renal Function Markers Lab Results  Component Value Date   BUN 10 11/23/2015   CREATININE 0.67 11/23/2015   GFRAA >60 11/23/2015   GFRNONAA >60 11/23/2015   Hepatic Function Markers Lab Results  Component Value Date   AST 23 11/23/2015   ALT 15 11/23/2015   ALBUMIN 4.6 11/23/2015   ALKPHOS 35 (L) 11/23/2015   Electrolytes Lab Results  Component Value Date   NA 138 11/23/2015   K 4.0 11/23/2015   CL 102 11/23/2015   CALCIUM 9.5 11/23/2015   MG 2.1 11/23/2015   Neuropathy Markers Lab Results  Component Value Date   VITAMINB12 318 11/23/2015   Bone Pathology Markers Lab Results  Component Value Date   ALKPHOS 35 (L) 11/23/2015   25OHVITD1 28 (L) 11/23/2015   25OHVITD2 <1.0 11/23/2015   25OHVITD3 28 11/23/2015   CALCIUM 9.5 11/23/2015   Coagulation Parameters No results found for: INR, LABPROT, APTT, PLT Cardiovascular Markers No results found for: BNP, HGB, HCT Note: Lab results reviewed.  Recent Diagnostic Imaging Review  Dg C-arm 1-60 Min-no Report  Result Date: 03/18/2016 Fluoroscopy was utilized by the requesting physician.  No radiographic interpretation.   Note: Imaging results reviewed.          Meds  The patient has a current medication list which includes the following prescription(s): vitamin d3, cyclobenzaprine, duloxetine, gabapentin, hydrocodone-acetaminophen, hydrocodone-acetaminophen, hydrocodone-acetaminophen, meloxicam, mometasone, pimecrolimus, and solifenacin.  Current Outpatient Prescriptions on File Prior to Visit  Medication Sig  . Cholecalciferol (VITAMIN D3) 2000 units capsule Take 1 capsule (2,000 Units total) by mouth daily.  . DULoxetine (CYMBALTA) 30 MG capsule TAKE 1 CAPSULE(30 MG) BY MOUTH EVERY DAY  . mometasone (ELOCON) 0.1 % cream Apply 1 application topically daily.  . pimecrolimus (ELIDEL) 1 % cream Apply 1 application topically 2 (two) times  daily.   No current facility-administered medications  on file prior to visit.    ROS  Constitutional: Denies any fever or chills Gastrointestinal: No reported hemesis, hematochezia, vomiting, or acute GI distress Musculoskeletal: Denies any acute onset joint swelling, redness, loss of ROM, or weakness Neurological: No reported episodes of acute onset apraxia, aphasia, dysarthria, agnosia, amnesia, paralysis, loss of coordination, or loss of consciousness  Allergies  Ms. Abrams is allergic to trazodone; bupropion; methocarbamol; oxycodone-acetaminophen; tramadol; and trazodone and nefazodone.  Learned  Drug: Ms. Phang  reports that she does not use drugs. Alcohol:  reports that she does not drink alcohol. Tobacco:  reports that she has been smoking Cigarettes.  She has been smoking about 0.50 packs per day. She has never used smokeless tobacco. Medical:  has a past medical history of Anxiety; Depression; Hyperlipidemia; Lupus; and Overactive bladder. Family: family history includes Cancer in her mother; Diabetes in her brother; Emphysema in her mother; Glaucoma in her father; Heart disease in her father; Hypertension in her mother; Stroke in her mother.  Past Surgical History:  Procedure Laterality Date  . ABDOMINAL HYSTERECTOMY     Constitutional Exam  General appearance: Well nourished, well developed, and well hydrated. In no apparent acute distress Vitals:   04/15/16 1255  BP: 132/78  Pulse: 91  Temp: 97.7 F (36.5 C)  TempSrc: Oral  SpO2: 99%  Weight: 153 lb (69.4 kg)  Height: 5' 3"  (1.6 m)   BMI Assessment: Estimated body mass index is 27.1 kg/m as calculated from the following:   Height as of this encounter: 5' 3"  (1.6 m).   Weight as of this encounter: 153 lb (69.4 kg).  BMI interpretation table: BMI level Category Range association with higher incidence of chronic pain  <18 kg/m2 Underweight   18.5-24.9 kg/m2 Ideal body weight   25-29.9 kg/m2 Overweight Increased incidence by 20%  30-34.9 kg/m2 Obese (Class I)  Increased incidence by 68%  35-39.9 kg/m2 Severe obesity (Class II) Increased incidence by 136%  >40 kg/m2 Extreme obesity (Class III) Increased incidence by 254%   BMI Readings from Last 4 Encounters:  04/15/16 27.10 kg/m  03/18/16 25.69 kg/m  03/07/16 25.69 kg/m  02/12/16 25.69 kg/m   Wt Readings from Last 4 Encounters:  04/15/16 153 lb (69.4 kg)  03/18/16 145 lb (65.8 kg)  03/07/16 145 lb (65.8 kg)  02/12/16 145 lb (65.8 kg)  Psych/Mental status: Alert, oriented x 3 (person, place, & time)       Eyes: PERLA Respiratory: No evidence of acute respiratory distress  Cervical Spine Exam  Inspection: No masses, redness, or swelling Alignment: Symmetrical Functional ROM: Limited ROM Stability: No instability detected Muscle strength & Tone: Functionally intact Sensory: Movement-associated pain Palpation: Complains of area being tender to palpation  Upper Extremity (UE) Exam    Side: Right upper extremity  Side: Left upper extremity  Inspection: No masses, redness, swelling, or asymmetry. No contractures  Inspection: No masses, redness, swelling, or asymmetry. No contractures  Functional ROM: Unrestricted ROM          Functional ROM: Unrestricted ROM          Muscle strength & Tone: Functionally intact  Muscle strength & Tone: Functionally intact  Sensory: Unimpaired  Sensory: Unimpaired  Palpation: No palpable anomalies  Palpation: No palpable anomalies  Specialized Test(s): Deferred         Specialized Test(s): Deferred          Thoracic Spine Exam  Inspection: No masses, redness, or  swelling Alignment: Symmetrical Functional ROM: Unrestricted ROM Stability: No instability detected Sensory: Unimpaired Muscle strength & Tone: No palpable anomalies  Lumbar Spine Exam  Inspection: No masses, redness, or swelling Alignment: Symmetrical Functional ROM: Unrestricted ROM Stability: No instability detected Muscle strength & Tone: Functionally intact Sensory:  Unimpaired Palpation: No palpable anomalies Provocative Tests: Lumbar Hyperextension and rotation test: evaluation deferred today       Patrick's Maneuver: evaluation deferred today              Gait & Posture Assessment  Ambulation: Unassisted Gait: Relatively normal for age and body habitus Posture: WNL   Lower Extremity Exam    Side: Right lower extremity  Side: Left lower extremity  Inspection: No masses, redness, swelling, or asymmetry. No contractures  Inspection: No masses, redness, swelling, or asymmetry. No contractures  Functional ROM: Unrestricted ROM          Functional ROM: Unrestricted ROM          Muscle strength & Tone: Functionally intact  Muscle strength & Tone: Functionally intact  Sensory: Unimpaired  Sensory: Unimpaired  Palpation: No palpable anomalies  Palpation: No palpable anomalies   Assessment  Primary Diagnosis & Pertinent Problem List: The primary encounter diagnosis was Chronic neck pain (Location of Primary source of pain) (Bilateral) (R>L). Diagnoses of Shoulder radicular pain (Bilateral) (R>L), Chronic upper back pain (Location of Secondary source of pain) (Bilateral) (R>L), Chronic low back pain (Location of Tertiary source of pain) (Bilateral) (R>L), Cervical facet syndrome, Cervical spondylosis, Chronic pain syndrome, Fibromyalgia, Musculoskeletal pain, Primary osteoarthritis involving multiple joints, Long term prescription opiate use, and Opiate use (80 MME/Day) were also pertinent to this visit.  Status Diagnosis  Not improving Improved Improved 1. Chronic neck pain (Location of Primary source of pain) (Bilateral) (R>L)   2. Shoulder radicular pain (Bilateral) (R>L)   3. Chronic upper back pain (Location of Secondary source of pain) (Bilateral) (R>L)   4. Chronic low back pain (Location of Tertiary source of pain) (Bilateral) (R>L)   5. Cervical facet syndrome   6. Cervical spondylosis   7. Chronic pain syndrome   8. Fibromyalgia   9.  Musculoskeletal pain   10. Primary osteoarthritis involving multiple joints   11. Long term prescription opiate use   12. Opiate use (80 MME/Day)      Plan of Care  Pharmacotherapy (Medications Ordered): Meds ordered this encounter  Medications  . HYDROcodone-acetaminophen (NORCO/VICODIN) 5-325 MG tablet    Sig: Take 1 tablet by mouth 2 (two) times daily.    Dispense:  60 tablet    Refill:  0    Do not place this medication, or any other prescription from our practice, on "Automatic Refill". Patient may have prescription filled one day early if pharmacy is closed on scheduled refill date. Do not fill until: 04/15/16 To last until: 05/15/16  . gabapentin (NEURONTIN) 300 MG capsule    Sig: Take 1 capsule (300 mg total) by mouth 3 (three) times daily.    Dispense:  270 capsule    Refill:  0    Do not add this medication to the electronic "Automatic Refill" notification system. Patient may have prescription filled one day early if pharmacy is closed on scheduled refill date.  . cyclobenzaprine (FLEXERIL) 10 MG tablet    Sig: Take 1 tablet (10 mg total) by mouth 3 (three) times daily as needed for muscle spasms.    Dispense:  90 tablet    Refill:  2  Do not add this medication to the electronic "Automatic Refill" notification system. Patient may have prescription filled one day early if pharmacy is closed on scheduled refill date.  . meloxicam (MOBIC) 15 MG tablet    Sig: Take 1 tablet (15 mg total) by mouth daily.    Dispense:  90 tablet    Refill:  0    Do not add this medication to the electronic "Automatic Refill" notification system. Patient may have prescription filled one day early if pharmacy is closed on scheduled refill date.  Marland Kitchen HYDROcodone-acetaminophen (NORCO/VICODIN) 5-325 MG tablet    Sig: Take 1 tablet by mouth 2 (two) times daily as needed for moderate pain.    Dispense:  60 tablet    Refill:  0    Do not place this medication, or any other prescription from our  practice, on "Automatic Refill". Patient may have prescription filled one day early if pharmacy is closed on scheduled refill date. Do not fill until: 05/15/16 To last until: 06/14/16  . HYDROcodone-acetaminophen (NORCO/VICODIN) 5-325 MG tablet    Sig: Take 1 tablet by mouth 2 (two) times daily as needed for moderate pain.    Dispense:  60 tablet    Refill:  0    Do not place this medication, or any other prescription from our practice, on "Automatic Refill". Patient may have prescription filled one day early if pharmacy is closed on scheduled refill date. Do not fill until: 06/14/16 To last until: 07/14/16   New Prescriptions   HYDROCODONE-ACETAMINOPHEN (NORCO/VICODIN) 5-325 MG TABLET    Take 1 tablet by mouth 2 (two) times daily as needed for moderate pain.   HYDROCODONE-ACETAMINOPHEN (NORCO/VICODIN) 5-325 MG TABLET    Take 1 tablet by mouth 2 (two) times daily as needed for moderate pain.   Medications administered today: Ms. Kronberg had no medications administered during this visit. Lab-work, procedure(s), and/or referral(s): Orders Placed This Encounter  Procedures  . CERVICAL FACET (MEDIAL BRANCH NERVE BLOCK)    Imaging and/or referral(s): None  Interventional therapies: Planned, scheduled, and/or pending:   Diagnostic bilateral cervical facet block under fluoroscopic guidance and IV sedation    Considering:   Diagnostic bilateral intra-articular shoulder joint injection  Diagnostic bilateral suprascapular nerve block  Possible bilateral suprascapular nerve RFA  Right-sided cervical epidural steroid injection #3 Diagnostic bilateral cervical facet block  Possible bilateral cervical facet radiofrequency ablation  Diagnostic bilateral lumbar facet block  Possible bilateral lumbar facet radiofrequency ablation    Palliative PRN treatment(s):   Palliative right-sided cervical epidural steroid injection   Provider-requested follow-up: Return in about 3 months (around  07/16/2016) for (MD) Med-Mgmt, in addition, procedure (ASAA).  Future Appointments Date Time Provider [place]  04/24/2016 8:45 AM Milinda Pointer, MD [facility] None  07/04/2016 9:30 AM Milinda Pointer, MD [facility] [place] None   Primary Care Physician: Ricardo Jericho, NP Location: [facility] Outpatient Pain Management Facility Note by: Kathlen Brunswick. Dossie Arbour, M.D, DABA, DABAPM, DABPM, DABIPP, FIPP Date: 04/15/2016; Time: 2:55 PM  Pain Score Disclaimer: We use the NRS-11 scale. This is a self-reported, subjective measurement of pain severity with only modest accuracy. It is used primarily to identify changes within a particular patient. It must be understood that outpatient pain scales are significantly less accurate that those used for research, where they can be applied under ideal controlled circumstances with minimal exposure to variables. In reality, the score is likely to be a combination of pain intensity and pain affect, where pain affect describes the degree of emotional  arousal or changes in action readiness caused by the sensory experience of pain. Factors such as social and work situation, setting, emotional state, anxiety levels, expectation, and prior pain experience may influence pain perception and show large inter-individual differences that may also be affected by time variables.  Patient instructions provided during this appointment: Patient Instructions   Pain Score  Introduction: The pain score used by this practice is the Verbal Numerical Rating Scale (VNRS-11). This is an 11-point scale. It is for adults and children 10 years or older. There are significant differences in how the pain score is reported, used, and applied. Forget everything you learned in the past and learn this scoring system.  General Information: The scale should reflect your current level of pain. Unless you are specifically asked for the level of your worst pain, or your average pain. If you are  asked for one of these two, then it should be understood that it is over the past 24 hours.  Basic Activities of Daily Living (ADL): Personal hygiene, dressing, eating, transferring, and using restroom.  Instructions: Most patients tend to report their level of pain as a combination of two factors, their physical pain and their psychosocial pain. This last one is also known as "suffering" and it is reflection of how physical pain affects you socially and psychologically. From now on, report them separately. From this point on, when asked to report your pain level, report only your physical pain. Use the following table for reference.  Pain Clinic Pain Levels (0-5/10)  Pain Level Score Description  No Pain 0   Mild pain 1 Nagging, annoying, but does not interfere with basic activities of daily living (ADL). Patients are able to eat, bathe, get dressed, toileting (being able to get on and off the toilet and perform personal hygiene functions), transfer (move in and out of bed or a chair without assistance), and maintain continence (able to control bladder and bowel functions). Blood pressure and heart rate are unaffected. A normal heart rate for a healthy adult ranges from 60 to 100 bpm (beats per minute).   Mild to moderate pain 2 Noticeable and distracting. Impossible to hide from other people. More frequent flare-ups. Still possible to adapt and function close to normal. It can be very annoying and may have occasional stronger flare-ups. With discipline, patients may get used to it and adapt.   Moderate pain 3 Interferes significantly with activities of daily living (ADL). It becomes difficult to feed, bathe, get dressed, get on and off the toilet or to perform personal hygiene functions. Difficult to get in and out of bed or a chair without assistance. Very distracting. With effort, it can be ignored when deeply involved in activities.   Moderately severe pain 4 Impossible to ignore for more than a  few minutes. With effort, patients may still be able to manage work or participate in some social activities. Very difficult to concentrate. Signs of autonomic nervous system discharge are evident: dilated pupils (mydriasis); mild sweating (diaphoresis); sleep interference. Heart rate becomes elevated (>115 bpm). Diastolic blood pressure (lower number) rises above 100 mmHg. Patients find relief in laying down and not moving.   Severe pain 5 Intense and extremely unpleasant. Associated with frowning face and frequent crying. Pain overwhelms the senses.  Ability to do any activity or maintain social relationships becomes significantly limited. Conversation becomes difficult. Pacing back and forth is common, as getting into a comfortable position is nearly impossible. Pain wakes you up from deep sleep.  Physical signs will be obvious: pupillary dilation; increased sweating; goosebumps; brisk reflexes; cold, clammy hands and feet; nausea, vomiting or dry heaves; loss of appetite; significant sleep disturbance with inability to fall asleep or to remain asleep. When persistent, significant weight loss is observed due to the complete loss of appetite and sleep deprivation.  Blood pressure and heart rate becomes significantly elevated. Caution: If elevated blood pressure triggers a pounding headache associated with blurred vision, then the patient should immediately seek attention at an urgent or emergency care unit, as these may be signs of an impending stroke.    Emergency Department Pain Levels (6-10/10)  Emergency Room Pain 6 Severely limiting. Requires emergency care and should not be seen or managed at an outpatient pain management facility. Communication becomes difficult and requires great effort. Assistance to reach the emergency department may be required. Facial flushing and profuse sweating along with potentially dangerous increases in heart rate and blood pressure will be evident.   Distressing pain 7  Self-care is very difficult. Assistance is required to transport, or use restroom. Assistance to reach the emergency department will be required. Tasks requiring coordination, such as bathing and getting dressed become very difficult.   Disabling pain 8 Self-care is no longer possible. At this level, pain is disabling. The individual is unable to do even the most "basic" activities such as walking, eating, bathing, dressing, transferring to a bed, or toileting. Fine motor skills are lost. It is difficult to think clearly.   Incapacitating pain 9 Pain becomes incapacitating. Thought processing is no longer possible. Difficult to remember your own name. Control of movement and coordination are lost.   The worst pain imaginable 10 At this level, most patients pass out from pain. When this level is reached, collapse of the autonomic nervous system occurs, leading to a sudden drop in blood pressure and heart rate. This in turn results in a temporary and dramatic drop in blood flow to the brain, leading to a loss of consciousness. Fainting is one of the body's self defense mechanisms. Passing out puts the brain in a calmed state and causes it to shut down for a while, in order to begin the healing process.    Summary: 1. Refer to this scale when providing [place] with your pain level. 2. Be accurate and careful when reporting your pain level. This will help with your care. 3. Over-reporting your pain level will lead to loss of credibility. 4. Even a level of 1/10 means that there is pain and will be treated at our facility. 5. High, inaccurate reporting will be documented as "Symptom Exaggeration", leading to loss of credibility and suspicions of possible secondary gains such as obtaining more narcotics, or wanting to appear disabled, for fraudulent reasons. 6. Only pain levels of 5 or below will be seen at our facility. 7. Pain levels of 6 and above will be sent to the Emergency Department and the appointment  cancelled. _____________________________________________________________________________________________  GENERAL RISKS AND COMPLICATIONS  What are the risk, side effects and possible complications? Generally speaking, most procedures are safe.  However, with any procedure there are risks, side effects, and the possibility of complications.  The risks and complications are dependent upon the sites that are lesioned, or the type of nerve block to be performed.  The closer the procedure is to the spine, the more serious the risks are.  Great care is taken when placing the radio frequency needles, block needles or lesioning probes, but sometimes complications can occur. 1.  Infection: Any time there is an injection through the skin, there is a risk of infection.  This is why sterile conditions are used for these blocks.  There are four possible types of infection. 1. Localized skin infection. 2. Central Nervous System Infection-This can be in the form of Meningitis, which can be deadly. 3. Epidural Infections-This can be in the form of an epidural abscess, which can cause pressure inside of the spine, causing compression of the spinal cord with subsequent paralysis. This would require an emergency surgery to decompress, and there are no guarantees that the patient would recover from the paralysis. 4. Discitis-This is an infection of the intervertebral discs.  It occurs in about 1% of discography procedures.  It is difficult to treat and it may lead to surgery.        2. Pain: the needles have to go through skin and soft tissues, will cause soreness.       3. Damage to internal structures:  The nerves to be lesioned may be near blood vessels or    other nerves which can be potentially damaged.       4. Bleeding: Bleeding is more common if the patient is taking blood thinners such as  aspirin, Coumadin, Ticiid, Plavix, etc., or if he/she have some genetic predisposition  such as hemophilia. Bleeding into  the spinal canal can cause compression of the spinal  cord with subsequent paralysis.  This would require an emergency surgery to  decompress and there are no guarantees that the patient would recover from the  paralysis.       5. Pneumothorax:  Puncturing of a lung is a possibility, every time a needle is introduced in  the area of the chest or upper back.  Pneumothorax refers to free air around the  collapsed lung(s), inside of the thoracic cavity (chest cavity).  Another two possible  complications related to a similar event would include: Hemothorax and Chylothorax.   These are variations of the Pneumothorax, where instead of air around the collapsed  lung(s), you may have blood or chyle, respectively.       6. Spinal headaches: They may occur with any procedures in the area of the spine.       7. Persistent CSF (Cerebro-Spinal Fluid) leakage: This is a rare problem, but may occur  with prolonged intrathecal or epidural catheters either due to the formation of a fistulous  track or a dural tear.       8. Nerve damage: By working so close to the spinal cord, there is always a possibility of  nerve damage, which could be as serious as a permanent spinal cord injury with  paralysis.       9. Death:  Although rare, severe deadly allergic reactions known as "Anaphylactic  reaction" can occur to any of the medications used.      10. Worsening of the symptoms:  We can always make thing worse.  What are the chances of something like this happening? Chances of any of this occuring are extremely low.  By statistics, you have more of a chance of getting killed in a motor vehicle accident: while driving to the hospital than any of the above occurring .  Nevertheless, you should be aware that they are possibilities.  In general, it is similar to taking a shower.  Everybody knows that you can slip, hit your head and get killed.  Does that mean that you should not shower again?  Nevertheless always  keep in mind that  statistics do not mean anything if you happen to be on the wrong side of them.  Even if a procedure has a 1 (one) in a 1,000,000 (million) chance of going wrong, it you happen to be that one..Also, keep in mind that by statistics, you have more of a chance of having something go wrong when taking medications.  Who should not have this procedure? If you are on a blood thinning medication (e.g. Coumadin, Plavix, see list of "Blood Thinners"), or if you have an active infection going on, you should not have the procedure.  If you are taking any blood thinners, please inform your physician.  How should I prepare for this procedure?  Do not eat or drink anything at least six hours prior to the procedure.  Bring a driver with you .  It cannot be a taxi.  Come accompanied by an adult that can drive you back, and that is strong enough to help you if your legs get weak or numb from the local anesthetic.  Take all of your medicines the morning of the procedure with just enough water to swallow them.  If you have diabetes, make sure that you are scheduled to have your procedure done first thing in the morning, whenever possible.  If you have diabetes, take only half of your insulin dose and notify our nurse that you have done so as soon as you arrive at the clinic.  If you are diabetic, but only take blood sugar pills (oral hypoglycemic), then do not take them on the morning of your procedure.  You may take them after you have had the procedure.  Do not take aspirin or any aspirin-containing medications, at least eleven (11) days prior to the procedure.  They may prolong bleeding.  Wear loose fitting clothing that may be easy to take off and that you would not mind if it got stained with Betadine or blood.  Do not wear any jewelry or perfume  Remove any nail coloring.  It will interfere with some of our monitoring equipment.  NOTE: Remember that this is not meant to be interpreted as a complete  list of all possible complications.  Unforeseen problems may occur.  BLOOD THINNERS The following drugs contain aspirin or other products, which can cause increased bleeding during surgery and should not be taken for 2 weeks prior to and 1 week after surgery.  If you should need take something for relief of minor pain, you may take acetaminophen which is found in Tylenol,m Datril, Anacin-3 and Panadol. It is not blood thinner. The products listed below are.  Do not take any of the products listed below in addition to any listed on your instruction sheet.  A.P.C or A.P.C with Codeine Codeine Phosphate Capsules #3 Ibuprofen Ridaura  ABC compound Congesprin Imuran rimadil  Advil Cope Indocin Robaxisal  Alka-Seltzer Effervescent Pain Reliever and Antacid Coricidin or Coricidin-D  Indomethacin Rufen  Alka-Seltzer plus Cold Medicine Cosprin Ketoprofen S-A-C Tablets  Anacin Analgesic Tablets or Capsules Coumadin Korlgesic Salflex  Anacin Extra Strength Analgesic tablets or capsules CP-2 Tablets Lanoril Salicylate  Anaprox Cuprimine Capsules Levenox Salocol  Anexsia-D Dalteparin Magan Salsalate  Anodynos Darvon compound Magnesium Salicylate Sine-off  Ansaid Dasin Capsules Magsal Sodium Salicylate  Anturane Depen Capsules Marnal Soma  APF Arthritis pain formula Dewitt's Pills Measurin Stanback  Argesic Dia-Gesic Meclofenamic Sulfinpyrazone  Arthritis Bayer Timed Release Aspirin Diclofenac Meclomen Sulindac  Arthritis pain formula Anacin Dicumarol Medipren Supac  Analgesic (  Safety coated) Arthralgen Diffunasal Mefanamic Suprofen  Arthritis Strength Bufferin Dihydrocodeine Mepro Compound Suprol  Arthropan liquid Dopirydamole Methcarbomol with Aspirin Synalgos  ASA tablets/Enseals Disalcid Micrainin Tagament  Ascriptin Doan's Midol Talwin  Ascriptin A/D Dolene Mobidin Tanderil  Ascriptin Extra Strength Dolobid Moblgesic Ticlid  Ascriptin with Codeine Doloprin or Doloprin with Codeine Momentum  Tolectin  Asperbuf Duoprin Mono-gesic Trendar  Aspergum Duradyne Motrin or Motrin IB Triminicin  Aspirin plain, buffered or enteric coated Durasal Myochrisine Trigesic  Aspirin Suppositories Easprin Nalfon Trillsate  Aspirin with Codeine Ecotrin Regular or Extra Strength Naprosyn Uracel  Atromid-S Efficin Naproxen Ursinus  Auranofin Capsules Elmiron Neocylate Vanquish  Axotal Emagrin Norgesic Verin  Azathioprine Empirin or Empirin with Codeine Normiflo Vitamin E  Azolid Emprazil Nuprin Voltaren  Bayer Aspirin plain, buffered or children's or timed BC Tablets or powders Encaprin Orgaran Warfarin Sodium  Buff-a-Comp Enoxaparin Orudis Zorpin  Buff-a-Comp with Codeine Equegesic Os-Cal-Gesic   Buffaprin Excedrin plain, buffered or Extra Strength Oxalid   Bufferin Arthritis Strength Feldene Oxphenbutazone   Bufferin plain or Extra Strength Feldene Capsules Oxycodone with Aspirin   Bufferin with Codeine Fenoprofen Fenoprofen Pabalate or Pabalate-SF   Buffets II Flogesic Panagesic   Buffinol plain or Extra Strength Florinal or Florinal with Codeine Panwarfarin   Buf-Tabs Flurbiprofen Penicillamine   Butalbital Compound Four-way cold tablets Penicillin   Butazolidin Fragmin Pepto-Bismol   Carbenicillin Geminisyn Percodan   Carna Arthritis Reliever Geopen Persantine   Carprofen Gold's salt Persistin   Chloramphenicol Goody's Phenylbutazone   Chloromycetin Haltrain Piroxlcam   Clmetidine heparin Plaquenil   Cllnoril Hyco-pap Ponstel   Clofibrate Hydroxy chloroquine Propoxyphen         Before stopping any of these medications, be sure to consult the physician who ordered them.  Some, such as Coumadin (Warfarin) are ordered to prevent or treat serious conditions such as "deep thrombosis", "pumonary embolisms", and other heart problems.  The amount of time that you may need off of the medication may also vary with the medication and the reason for which you were taking it.  If you are taking any  of these medications, please make sure you notify your pain physician before you undergo any procedures.          Facet Joint Block The facet joints connect the bones of the spine (vertebrae). They make it possible for you to bend, twist, and make other movements with your spine. They also keep you from bending too far, twisting too far, and making other excessive movements. A facet joint block is a procedure where a numbing medicine (anesthetic) is injected into a facet joint. Often, a type of anti-inflammatory medicine called a steroid is also injected. A facet joint block may be done to diagnose neck or back pain. If the pain gets better after a facet joint block, it means the pain is probably coming from the facet joint. If the pain does not get better, it means the pain is probably not coming from the facet joint. A facet joint block may also be done to relieve neck or back pain caused by an inflamed facet joint. A facet joint block is only done to relieve pain if the pain does not improve with other methods, such as medicine, exercise programs, and physical therapy. Tell a health care provider about:  Any allergies you have.  All medicines you are taking, including vitamins, herbs, eye drops, creams, and over-the-counter medicines.  Any problems you or family members have had with anesthetic medicines.  Any blood  disorders you have.  Any surgeries you have had.  Any medical conditions you have.  Whether you are pregnant or may be pregnant. What are the risks? Generally, this is a safe procedure. However, problems may occur, including:  Bleeding.  Injury to a nerve near the injection site.  Pain at the injection site.  Weakness or numbness in areas controlled by nerves near the injection site.  Infection.  Temporary fluid retention.  Allergic reactions to medicines or dyes.  Injury to other structures or organs near the injection site. What happens before the  procedure?  Follow instructions from your health care provider about eating or drinking restrictions.  Ask your health care provider about:  Changing or stopping your regular medicines. This is especially important if you are taking diabetes medicines or blood thinners.  Taking medicines such as aspirin and ibuprofen. These medicines can thin your blood. Do not take these medicines before your procedure if your health care provider instructs you not to.  Do not take any new dietary supplements or medicines without asking your health care provider first.  Plan to have someone take you home after the procedure. What happens during the procedure?  You may need to remove your clothing and dress in an open-back gown.  The procedure will be done while you are lying on an X-ray table. You will most likely be asked to lie on your stomach, but you may be asked to lie in a different position if an injection will be made in your neck.  Machines will be used to monitor your oxygen levels, heart rate, and blood pressure.  If an injection will be made in your neck, an IV tube will be inserted into one of your veins. Fluids and medicine will flow directly into your body through the IV tube.  The area over the facet joint where the injection will be made will be cleaned with soap. The surrounding skin will be covered with clean drapes.  A numbing medicine (local anesthetic) will be applied to your skin. Your skin may sting or burn for a moment.  A video X-ray machine (fluoroscopy) will be used to locate the joint. In some cases, a CT scan may be used.  A contrast dye may be injected into the facet joint area to help locate the joint.  When the joint is located, an anesthetic will be injected into the joint through the needle.  Your health care provider will ask you whether you feel pain relief. If you do feel relief, a steroid may be injected to provide pain relief for a longer period of time. If  you do not feel relief or feel only partial relief, additional injections of an anesthetic may be made in other facet joints.  The needle will be removed.  Your skin will be cleaned.  A bandage (dressing) will be applied over each injection site. The procedure may vary among health care providers and hospitals. What happens after the procedure?  You will be observed for 15-30 minutes before being allowed to go home. This information is not intended to replace advice given to you by your health care provider. Make sure you discuss any questions you have with your health care provider. Document Released: 05/29/2006 Document Revised: 02/08/2015 Document Reviewed: 10/03/2014 [employer] Interactive Patient Education  2017 [employer].

## 2016-04-15 NOTE — Patient Instructions (Addendum)
Pain Score  Introduction: The pain score used by this practice is the Verbal Numerical Rating Scale (VNRS-11). This is an 11-point scale. It is for adults and children 10 years or older. There are significant differences in how the pain score is reported, used, and applied. Forget everything you learned in the past and learn this scoring system.  General Information: The scale should reflect your current level of pain. Unless you are specifically asked for the level of your worst pain, or your average pain. If you are asked for one of these two, then it should be understood that it is over the past 24 hours.  Basic Activities of Daily Living (ADL): Personal hygiene, dressing, eating, transferring, and using restroom.  Instructions: Most patients tend to report their level of pain as a combination of two factors, their physical pain and their psychosocial pain. This last one is also known as "suffering" and it is reflection of how physical pain affects you socially and psychologically. From now on, report them separately. From this point on, when asked to report your pain level, report only your physical pain. Use the following table for reference.  Pain Clinic Pain Levels (0-5/10)  Pain Level Score Description  No Pain 0   Mild pain 1 Nagging, annoying, but does not interfere with basic activities of daily living (ADL). Patients are able to eat, bathe, get dressed, toileting (being able to get on and off the toilet and perform personal hygiene functions), transfer (move in and out of bed or a chair without assistance), and maintain continence (able to control bladder and bowel functions). Blood pressure and heart rate are unaffected. A normal heart rate for a healthy adult ranges from 60 to 100 bpm (beats per minute).   Mild to moderate pain 2 Noticeable and distracting. Impossible to hide from other people. More frequent flare-ups. Still possible to adapt and function close to normal. It can be very  annoying and may have occasional stronger flare-ups. With discipline, patients may get used to it and adapt.   Moderate pain 3 Interferes significantly with activities of daily living (ADL). It becomes difficult to feed, bathe, get dressed, get on and off the toilet or to perform personal hygiene functions. Difficult to get in and out of bed or a chair without assistance. Very distracting. With effort, it can be ignored when deeply involved in activities.   Moderately severe pain 4 Impossible to ignore for more than a few minutes. With effort, patients may still be able to manage work or participate in some social activities. Very difficult to concentrate. Signs of autonomic nervous system discharge are evident: dilated pupils (mydriasis); mild sweating (diaphoresis); sleep interference. Heart rate becomes elevated (>115 bpm). Diastolic blood pressure (lower number) rises above 100 mmHg. Patients find relief in laying down and not moving.   Severe pain 5 Intense and extremely unpleasant. Associated with frowning face and frequent crying. Pain overwhelms the senses.  Ability to do any activity or maintain social relationships becomes significantly limited. Conversation becomes difficult. Pacing back and forth is common, as getting into a comfortable position is nearly impossible. Pain wakes you up from deep sleep. Physical signs will be obvious: pupillary dilation; increased sweating; goosebumps; brisk reflexes; cold, clammy hands and feet; nausea, vomiting or dry heaves; loss of appetite; significant sleep disturbance with inability to fall asleep or to remain asleep. When persistent, significant weight loss is observed due to the complete loss of appetite and sleep deprivation.  Blood pressure and heart   rate becomes significantly elevated. Caution: If elevated blood pressure triggers a pounding headache associated with blurred vision, then the patient should immediately seek attention at an urgent or  emergency care unit, as these may be signs of an impending stroke.    Emergency Department Pain Levels (6-10/10)  Emergency Room Pain 6 Severely limiting. Requires emergency care and should not be seen or managed at an outpatient pain management facility. Communication becomes difficult and requires great effort. Assistance to reach the emergency department may be required. Facial flushing and profuse sweating along with potentially dangerous increases in heart rate and blood pressure will be evident.   Distressing pain 7 Self-care is very difficult. Assistance is required to transport, or use restroom. Assistance to reach the emergency department will be required. Tasks requiring coordination, such as bathing and getting dressed become very difficult.   Disabling pain 8 Self-care is no longer possible. At this level, pain is disabling. The individual is unable to do even the most "basic" activities such as walking, eating, bathing, dressing, transferring to a bed, or toileting. Fine motor skills are lost. It is difficult to think clearly.   Incapacitating pain 9 Pain becomes incapacitating. Thought processing is no longer possible. Difficult to remember your own name. Control of movement and coordination are lost.   The worst pain imaginable 10 At this level, most patients pass out from pain. When this level is reached, collapse of the autonomic nervous system occurs, leading to a sudden drop in blood pressure and heart rate. This in turn results in a temporary and dramatic drop in blood flow to the brain, leading to a loss of consciousness. Fainting is one of the body's self defense mechanisms. Passing out puts the brain in a calmed state and causes it to shut down for a while, in order to begin the healing process.    Summary: 1. Refer to this scale when providing us with your pain level. 2. Be accurate and careful when reporting your pain level. This will help with your care. 3. Over-reporting  your pain level will lead to loss of credibility. 4. Even a level of 1/10 means that there is pain and will be treated at our facility. 5. High, inaccurate reporting will be documented as "Symptom Exaggeration", leading to loss of credibility and suspicions of possible secondary gains such as obtaining more narcotics, or wanting to appear disabled, for fraudulent reasons. 6. Only pain levels of 5 or below will be seen at our facility. 7. Pain levels of 6 and above will be sent to the Emergency Department and the appointment cancelled. _____________________________________________________________________________________________  GENERAL RISKS AND COMPLICATIONS  What are the risk, side effects and possible complications? Generally speaking, most procedures are safe.  However, with any procedure there are risks, side effects, and the possibility of complications.  The risks and complications are dependent upon the sites that are lesioned, or the type of nerve block to be performed.  The closer the procedure is to the spine, the more serious the risks are.  Great care is taken when placing the radio frequency needles, block needles or lesioning probes, but sometimes complications can occur. 1. Infection: Any time there is an injection through the skin, there is a risk of infection.  This is why sterile conditions are used for these blocks.  There are four possible types of infection. 1. Localized skin infection. 2. Central Nervous System Infection-This can be in the form of Meningitis, which can be deadly. 3. Epidural Infections-This can   be in the form of an epidural abscess, which can cause pressure inside of the spine, causing compression of the spinal cord with subsequent paralysis. This would require an emergency surgery to decompress, and there are no guarantees that the patient would recover from the paralysis. 4. Discitis-This is an infection of the intervertebral discs.  It occurs in about 1% of  discography procedures.  It is difficult to treat and it may lead to surgery.        2. Pain: the needles have to go through skin and soft tissues, will cause soreness.       3. Damage to internal structures:  The nerves to be lesioned may be near blood vessels or    other nerves which can be potentially damaged.       4. Bleeding: Bleeding is more common if the patient is taking blood thinners such as  aspirin, Coumadin, Ticiid, Plavix, etc., or if he/she have some genetic predisposition  such as hemophilia. Bleeding into the spinal canal can cause compression of the spinal  cord with subsequent paralysis.  This would require an emergency surgery to  decompress and there are no guarantees that the patient would recover from the  paralysis.       5. Pneumothorax:  Puncturing of a lung is a possibility, every time a needle is introduced in  the area of the chest or upper back.  Pneumothorax refers to free air around the  collapsed lung(s), inside of the thoracic cavity (chest cavity).  Another two possible  complications related to a similar event would include: Hemothorax and Chylothorax.   These are variations of the Pneumothorax, where instead of air around the collapsed  lung(s), you may have blood or chyle, respectively.       6. Spinal headaches: They may occur with any procedures in the area of the spine.       7. Persistent CSF (Cerebro-Spinal Fluid) leakage: This is a rare problem, but may occur  with prolonged intrathecal or epidural catheters either due to the formation of a fistulous  track or a dural tear.       8. Nerve damage: By working so close to the spinal cord, there is always a possibility of  nerve damage, which could be as serious as a permanent spinal cord injury with  paralysis.       9. Death:  Although rare, severe deadly allergic reactions known as "Anaphylactic  reaction" can occur to any of the medications used.      10. Worsening of the symptoms:  We can always make thing  worse.  What are the chances of something like this happening? Chances of any of this occuring are extremely low.  By statistics, you have more of a chance of getting killed in a motor vehicle accident: while driving to the hospital than any of the above occurring .  Nevertheless, you should be aware that they are possibilities.  In general, it is similar to taking a shower.  Everybody knows that you can slip, hit your head and get killed.  Does that mean that you should not shower again?  Nevertheless always keep in mind that statistics do not mean anything if you happen to be on the wrong side of them.  Even if a procedure has a 1 (one) in a 1,000,000 (million) chance of going wrong, it you happen to be that one..Also, keep in mind that by statistics, you have more of a chance of having   something go wrong when taking medications.  Who should not have this procedure? If you are on a blood thinning medication (e.g. Coumadin, Plavix, see list of "Blood Thinners"), or if you have an active infection going on, you should not have the procedure.  If you are taking any blood thinners, please inform your physician.  How should I prepare for this procedure?  Do not eat or drink anything at least six hours prior to the procedure.  Bring a driver with you .  It cannot be a taxi.  Come accompanied by an adult that can drive you back, and that is strong enough to help you if your legs get weak or numb from the local anesthetic.  Take all of your medicines the morning of the procedure with just enough water to swallow them.  If you have diabetes, make sure that you are scheduled to have your procedure done first thing in the morning, whenever possible.  If you have diabetes, take only half of your insulin dose and notify our nurse that you have done so as soon as you arrive at the clinic.  If you are diabetic, but only take blood sugar pills (oral hypoglycemic), then do not take them on the morning of your  procedure.  You may take them after you have had the procedure.  Do not take aspirin or any aspirin-containing medications, at least eleven (11) days prior to the procedure.  They may prolong bleeding.  Wear loose fitting clothing that may be easy to take off and that you would not mind if it got stained with Betadine or blood.  Do not wear any jewelry or perfume  Remove any nail coloring.  It will interfere with some of our monitoring equipment.  NOTE: Remember that this is not meant to be interpreted as a complete list of all possible complications.  Unforeseen problems may occur.  BLOOD THINNERS The following drugs contain aspirin or other products, which can cause increased bleeding during surgery and should not be taken for 2 weeks prior to and 1 week after surgery.  If you should need take something for relief of minor pain, you may take acetaminophen which is found in Tylenol,m Datril, Anacin-3 and Panadol. It is not blood thinner. The products listed below are.  Do not take any of the products listed below in addition to any listed on your instruction sheet.  A.P.C or A.P.C with Codeine Codeine Phosphate Capsules #3 Ibuprofen Ridaura  ABC compound Congesprin Imuran rimadil  Advil Cope Indocin Robaxisal  Alka-Seltzer Effervescent Pain Reliever and Antacid Coricidin or Coricidin-D  Indomethacin Rufen  Alka-Seltzer plus Cold Medicine Cosprin Ketoprofen S-A-C Tablets  Anacin Analgesic Tablets or Capsules Coumadin Korlgesic Salflex  Anacin Extra Strength Analgesic tablets or capsules CP-2 Tablets Lanoril Salicylate  Anaprox Cuprimine Capsules Levenox Salocol  Anexsia-D Dalteparin Magan Salsalate  Anodynos Darvon compound Magnesium Salicylate Sine-off  Ansaid Dasin Capsules Magsal Sodium Salicylate  Anturane Depen Capsules Marnal Soma  APF Arthritis pain formula Dewitt's Pills Measurin Stanback  Argesic Dia-Gesic Meclofenamic Sulfinpyrazone  Arthritis Bayer Timed Release Aspirin  Diclofenac Meclomen Sulindac  Arthritis pain formula Anacin Dicumarol Medipren Supac  Analgesic (Safety coated) Arthralgen Diffunasal Mefanamic Suprofen  Arthritis Strength Bufferin Dihydrocodeine Mepro Compound Suprol  Arthropan liquid Dopirydamole Methcarbomol with Aspirin Synalgos  ASA tablets/Enseals Disalcid Micrainin Tagament  Ascriptin Doan's Midol Talwin  Ascriptin A/D Dolene Mobidin Tanderil  Ascriptin Extra Strength Dolobid Moblgesic Ticlid  Ascriptin with Codeine Doloprin or Doloprin with Codeine Momentum Tolectin  Asperbuf Duoprin Mono-gesic Trendar  Aspergum Duradyne Motrin or Motrin IB Triminicin  Aspirin plain, buffered or enteric coated Durasal Myochrisine Trigesic  Aspirin Suppositories Easprin Nalfon Trillsate  Aspirin with Codeine Ecotrin Regular or Extra Strength Naprosyn Uracel  Atromid-S Efficin Naproxen Ursinus  Auranofin Capsules Elmiron Neocylate Vanquish  Axotal Emagrin Norgesic Verin  Azathioprine Empirin or Empirin with Codeine Normiflo Vitamin E  Azolid Emprazil Nuprin Voltaren  Bayer Aspirin plain, buffered or children's or timed BC Tablets or powders Encaprin Orgaran Warfarin Sodium  Buff-a-Comp Enoxaparin Orudis Zorpin  Buff-a-Comp with Codeine Equegesic Os-Cal-Gesic   Buffaprin Excedrin plain, buffered or Extra Strength Oxalid   Bufferin Arthritis Strength Feldene Oxphenbutazone   Bufferin plain or Extra Strength Feldene Capsules Oxycodone with Aspirin   Bufferin with Codeine Fenoprofen Fenoprofen Pabalate or Pabalate-SF   Buffets II Flogesic Panagesic   Buffinol plain or Extra Strength Florinal or Florinal with Codeine Panwarfarin   Buf-Tabs Flurbiprofen Penicillamine   Butalbital Compound Four-way cold tablets Penicillin   Butazolidin Fragmin Pepto-Bismol   Carbenicillin Geminisyn Percodan   Carna Arthritis Reliever Geopen Persantine   Carprofen Gold's salt Persistin   Chloramphenicol Goody's Phenylbutazone   Chloromycetin Haltrain Piroxlcam    Clmetidine heparin Plaquenil   Cllnoril Hyco-pap Ponstel   Clofibrate Hydroxy chloroquine Propoxyphen         Before stopping any of these medications, be sure to consult the physician who ordered them.  Some, such as Coumadin (Warfarin) are ordered to prevent or treat serious conditions such as "deep thrombosis", "pumonary embolisms", and other heart problems.  The amount of time that you may need off of the medication may also vary with the medication and the reason for which you were taking it.  If you are taking any of these medications, please make sure you notify your pain physician before you undergo any procedures.          Facet Joint Block The facet joints connect the bones of the spine (vertebrae). They make it possible for you to bend, twist, and make other movements with your spine. They also keep you from bending too far, twisting too far, and making other excessive movements. A facet joint block is a procedure where a numbing medicine (anesthetic) is injected into a facet joint. Often, a type of anti-inflammatory medicine called a steroid is also injected. A facet joint block may be done to diagnose neck or back pain. If the pain gets better after a facet joint block, it means the pain is probably coming from the facet joint. If the pain does not get better, it means the pain is probably not coming from the facet joint. A facet joint block may also be done to relieve neck or back pain caused by an inflamed facet joint. A facet joint block is only done to relieve pain if the pain does not improve with other methods, such as medicine, exercise programs, and physical therapy. Tell a health care provider about:  Any allergies you have.  All medicines you are taking, including vitamins, herbs, eye drops, creams, and over-the-counter medicines.  Any problems you or family members have had with anesthetic medicines.  Any blood disorders you have.  Any surgeries you have  had.  Any medical conditions you have.  Whether you are pregnant or may be pregnant. What are the risks? Generally, this is a safe procedure. However, problems may occur, including:  Bleeding.  Injury to a nerve near the injection site.  Pain at the injection site.  Weakness  or numbness in areas controlled by nerves near the injection site.  Infection.  Temporary fluid retention.  Allergic reactions to medicines or dyes.  Injury to other structures or organs near the injection site. What happens before the procedure?  Follow instructions from your health care provider about eating or drinking restrictions.  Ask your health care provider about:  Changing or stopping your regular medicines. This is especially important if you are taking diabetes medicines or blood thinners.  Taking medicines such as aspirin and ibuprofen. These medicines can thin your blood. Do not take these medicines before your procedure if your health care provider instructs you not to.  Do not take any new dietary supplements or medicines without asking your health care provider first.  Plan to have someone take you home after the procedure. What happens during the procedure?  You may need to remove your clothing and dress in an open-back gown.  The procedure will be done while you are lying on an X-ray table. You will most likely be asked to lie on your stomach, but you may be asked to lie in a different position if an injection will be made in your neck.  Machines will be used to monitor your oxygen levels, heart rate, and blood pressure.  If an injection will be made in your neck, an IV tube will be inserted into one of your veins. Fluids and medicine will flow directly into your body through the IV tube.  The area over the facet joint where the injection will be made will be cleaned with soap. The surrounding skin will be covered with clean drapes.  A numbing medicine (local anesthetic) will be  applied to your skin. Your skin may sting or burn for a moment.  A video X-ray machine (fluoroscopy) will be used to locate the joint. In some cases, a CT scan may be used.  A contrast dye may be injected into the facet joint area to help locate the joint.  When the joint is located, an anesthetic will be injected into the joint through the needle.  Your health care provider will ask you whether you feel pain relief. If you do feel relief, a steroid may be injected to provide pain relief for a longer period of time. If you do not feel relief or feel only partial relief, additional injections of an anesthetic may be made in other facet joints.  The needle will be removed.  Your skin will be cleaned.  A bandage (dressing) will be applied over each injection site. The procedure may vary among health care providers and hospitals. What happens after the procedure?  You will be observed for 15-30 minutes before being allowed to go home. This information is not intended to replace advice given to you by your health care provider. Make sure you discuss any questions you have with your health care provider. Document Released: 05/29/2006 Document Revised: 02/08/2015 Document Reviewed: 10/03/2014 [employer] Interactive Patient Education  2017 [employer].

## 2016-04-24 ENCOUNTER — Ambulatory Visit
Admission: RE | Admit: 2016-04-24 | Discharge: 2016-04-24 | Disposition: A | Discharge status: Home / Self Care | Payer: Medicaid Other | Source: Ambulatory Visit | Attending: Pain Medicine | Admitting: Pain Medicine

## 2016-04-24 ENCOUNTER — Ambulatory Visit (HOSPITAL_BASED_OUTPATIENT_CLINIC_OR_DEPARTMENT_OTHER): Payer: Medicaid Other | Admitting: Pain Medicine

## 2016-04-24 ENCOUNTER — Encounter: Payer: Self-pay | Admitting: Pain Medicine

## 2016-04-24 VITALS — BP 127/74 | HR 64 | Temp 98.3°F | Resp 15 | Ht 63.0 in | Wt 151.0 lb

## 2016-04-24 DIAGNOSIS — M542 Cervicalgia: Secondary | ICD-10-CM

## 2016-04-24 DIAGNOSIS — Z79899 Other long term (current) drug therapy: Secondary | ICD-10-CM | POA: Diagnosis not present

## 2016-04-24 DIAGNOSIS — M47812 Spondylosis without myelopathy or radiculopathy, cervical region: Secondary | ICD-10-CM | POA: Diagnosis present

## 2016-04-24 DIAGNOSIS — M1288 Other specific arthropathies, not elsewhere classified, other specified site: Secondary | ICD-10-CM | POA: Diagnosis not present

## 2016-04-24 DIAGNOSIS — G8929 Other chronic pain: Secondary | ICD-10-CM | POA: Insufficient documentation

## 2016-04-24 DIAGNOSIS — Z90710 Acquired absence of both cervix and uterus: Secondary | ICD-10-CM | POA: Insufficient documentation

## 2016-04-24 MED ORDER — ROPIVACAINE HCL 2 MG/ML IJ SOLN
9.0000 mL | Freq: Once | INTRAMUSCULAR | Status: DC
  Filled 2016-04-24: qty 10

## 2016-04-24 MED ORDER — LACTATED RINGERS IV SOLN
1000.0000 mL | Freq: Once | INTRAVENOUS | Status: AC
  Administered 2016-04-24: 1000 mL via INTRAVENOUS

## 2016-04-24 MED ORDER — DEXAMETHASONE SODIUM PHOSPHATE 4 MG/ML IJ SOLN
INTRAMUSCULAR | Status: AC
  Filled 2016-04-24: qty 1

## 2016-04-24 MED ORDER — FENTANYL CITRATE (PF) 100 MCG/2ML IJ SOLN
25.0000 ug | INTRAMUSCULAR | Status: DC | PRN
  Administered 2016-04-24: 100 ug via INTRAVENOUS
  Filled 2016-04-24: qty 2

## 2016-04-24 MED ORDER — MIDAZOLAM HCL 5 MG/5ML IJ SOLN
1.0000 mg | INTRAMUSCULAR | Status: DC | PRN
  Administered 2016-04-24: 5 mg via INTRAVENOUS
  Filled 2016-04-24: qty 5

## 2016-04-24 MED ORDER — LIDOCAINE HCL (PF) 1 % IJ SOLN
10.0000 mL | Freq: Once | INTRAMUSCULAR | Status: AC
  Administered 2016-04-24: 5 mL
  Filled 2016-04-24: qty 10

## 2016-04-24 MED ORDER — DEXAMETHASONE SODIUM PHOSPHATE 4 MG/ML IJ SOLN
10.0000 mg | Freq: Once | INTRAMUSCULAR | Status: AC
  Administered 2016-04-24: 4 mg
  Filled 2016-04-24: qty 3

## 2016-04-24 NOTE — Progress Notes (Signed)
Safety precautions to be maintained throughout the outpatient stay will include: orient to surroundings, keep bed in low position, maintain call bell within reach at all times, provide assistance with transfer out of bed and ambulation.  

## 2016-04-24 NOTE — Patient Instructions (Addendum)

## 2016-04-24 NOTE — Progress Notes (Signed)
Patient's Name: Tanya [name]  MRN: [id number]  Referring Provider: Milinda Pointer, MD  DOB: Jun 05, 1960  PCP: Ricardo Jericho, NP  DOS: 04/24/2016  Note by: Kathlen Brunswick. Dossie Arbour, MD  Service setting: Ambulatory outpatient  Location: [facility] (AMB) Pain Management Facility  Visit type: Procedure  Specialty: Interventional Pain Management  Patient type: Established   Primary Reason for Visit: Interventional Pain Management Treatment. CC: Neck Pain  Procedure:  Anesthesia, Analgesia, Anxiolysis:  Type: Diagnostic Cervical Facet Medial Branch Block(s) Region: Posterolateral cervical spine region Level: C3, C4, C5, C6, & C7 Medial Branch Level(s) Laterality: Bilateral Paraspinal  Type: Local Anesthesia with Moderate (Conscious) Sedation Local Anesthetic: Lidocaine 1% Route: Intravenous (IV) IV Access: Secured Sedation: Meaningful verbal contact was maintained at all times during the procedure  Indication(s): Analgesia and Anxiety  Indications: 1. Cervical facet syndrome   2. Cervical spondylosis   3. Chronic neck pain (Location of Primary source of pain) (Bilateral) (R>L)    Pain Score: Pre-procedure: 4 /10 Post-procedure: 0-No pain/10  Pre-op Assessment:  Previous date of service: 04/15/16 Service provided: Evaluation Tanya [name] is a 56 y.o. (year old), female patient, seen today for interventional treatment. She  has a past surgical history that includes Abdominal hysterectomy. Her primarily concern today is the Neck Pain  Initial Vital Signs: Blood pressure 135/77, pulse 88, temperature 97.8 F (36.6 C), temperature source Oral, resp. rate 18, height 5\' 3"  (1.6 m), weight 151 lb (68.5 kg), SpO2 100 %. BMI: 26.75 kg/m  Risk Assessment: Allergies: Reviewed. She is allergic to trazodone; bupropion; methocarbamol; oxycodone-acetaminophen; tramadol; and trazodone and nefazodone.  Allergy Precautions: None required Coagulopathies: "Reviewed. None identified.   Blood-thinner therapy: None at this time Active Infection(s): Reviewed. None identified. Tanya [name] is afebrile  Site Confirmation: Tanya [name] was asked to confirm the procedure and laterality before marking the site Procedure checklist: Completed Consent: Before the procedure and under the influence of no sedative(s), amnesic(s), or anxiolytics, the patient was informed of the treatment options, risks and possible complications. To fulfill our ethical and legal obligations, as recommended by the American Medical Association's Code of Ethics, I have informed the patient of my clinical impression; the nature and purpose of the treatment or procedure; the risks, benefits, and possible complications of the intervention; the alternatives, including doing nothing; the risk(s) and benefit(s) of the alternative treatment(s) or procedure(s); and the risk(s) and benefit(s) of doing nothing. The patient was provided information about the general risks and possible complications associated with the procedure. These may include, but are not limited to: failure to achieve desired goals, infection, bleeding, organ or nerve damage, allergic reactions, paralysis, and death. In addition, the patient was informed of those risks and complications associated to Spine-related procedures, such as failure to decrease pain; infection (i.e.: Meningitis, epidural or intraspinal abscess); bleeding (i.e.: epidural hematoma, subarachnoid hemorrhage, or any other type of intraspinal or peri-dural bleeding); organ or nerve damage (i.e.: Any type of peripheral nerve, nerve root, or spinal cord injury) with subsequent damage to sensory, motor, and/or autonomic systems, resulting in permanent pain, numbness, and/or weakness of one or several areas of the body; allergic reactions; (i.e.: anaphylactic reaction); and/or death. Furthermore, the patient was informed of those risks and complications associated with the medications. These  include, but are not limited to: allergic reactions (i.e.: anaphylactic or anaphylactoid reaction(s)); adrenal axis suppression; blood sugar elevation that in diabetics may result in ketoacidosis or comma; water retention that in patients with history of congestive heart failure may result  in shortness of breath, pulmonary edema, and decompensation with resultant heart failure; weight gain; swelling or edema; medication-induced neural toxicity; particulate matter embolism and blood vessel occlusion with resultant organ, and/or nervous system infarction; and/or aseptic necrosis of one or more joints. Finally, the patient was informed that Medicine is not an exact science; therefore, there is also the possibility of unforeseen or unpredictable risks and/or possible complications that may result in a catastrophic outcome. The patient indicated having understood very clearly. We have given the patient no guarantees and we have made no promises. Enough time was given to the patient to ask questions, all of which were answered to the patient's satisfaction. Tanya [name] has indicated that she wanted to continue with the procedure. Attestation: I, the ordering provider, attest that I have discussed with the patient the benefits, risks, side-effects, alternatives, likelihood of achieving goals, and potential problems during recovery for the procedure that I have provided informed consent. Date: 04/24/2016; Time: 9:20 AM  Pre-Procedure Preparation:  Monitoring: As per clinic protocol. Respiration, ETCO2, SpO2, BP, heart rate and rhythm monitor placed and checked for adequate function Safety Precautions: Patient was assessed for positional comfort and pressure points before starting the procedure. Time-out: I initiated and conducted the "Time-out" before starting the procedure, as per protocol. The patient was asked to participate by confirming the accuracy of the "Time Out" information. Verification of the correct  person, site, and procedure were performed and confirmed by me, the nursing staff, and the patient. "Time-out" conducted as per Joint Commission's Universal Protocol (UP.01.01.01). "Time-out" Date & Time: 04/24/2016; 1001 hrs.  Description of Procedure Process:   Position: Prone with head of the table was raised to facilitate breathing. Target Area: For Cervical Facet blocks, the target is the postero-lateral waist of the articular pillars at the C3, C4, C5, C6, & C7 levels. Approach: Posterior approach. Area Prepped: Entire Posterior Cervico-thoracic Region Prepping solution: ChloraPrep (2% chlorhexidine gluconate and 70% isopropyl alcohol) Safety Precautions: Aspiration looking for blood return was conducted prior to all injections. At no point did we inject any substances, as a needle was being advanced. No attempts were made at seeking any paresthesias. Safe injection practices and needle disposal techniques used. Medications properly checked for expiration dates. SDV (single dose vial) medications used. Description of the Procedure: Protocol guidelines were followed. The patient was placed in position over the fluoroscopy table. The target area was identified and the area prepped in the usual manner. Skin desensitized using vapocoolant spray. Skin & deeper tissues infiltrated with local anesthetic. Appropriate amount of time allowed to pass for local anesthetics to take effect. The procedure needle was introduced through the skin, ipsilateral to the reported pain, and advanced to the target area. Bone was contacted on the posterior aspect of the articular pillars and the needle walked lateral, until the border was cleared. Lateral views taken to make sure the needle tip did not advance past the posterior third of the lateral mass of the posterior columns. The procedure was repeated in identical fashion for each level. Negative aspiration confirmed. Solution injected in intermittent fashion, asking for  systemic symptoms every 0.5cc of injectate. The needles were then removed and the area cleansed, making sure to leave some of the prepping solution back to take advantage of its long term bactericidal properties. Vitals:   04/24/16 1017 04/24/16 1025 04/24/16 1035 04/24/16 1045  BP: 121/83 121/72 123/75 127/74  Pulse: 86 66 75 64  Resp: 20 18 17 15   Temp:  98.3 F (  36.8 C)    TempSrc:  Temporal    SpO2: 97% 95% 94% 99%  Weight:      Height:        Start Time: 1001 hrs. End Time: 1015 hrs. Materials:  Needle(s) Type: Regular needle Gauge: 22G Length: 3.5-in Medication(s): We administered fentaNYL, lactated ringers, midazolam, dexamethasone, dexamethasone, and lidocaine (PF). Please see chart orders for dosing details.  Imaging Guidance (Spinal):  Type of Imaging Technique: Fluoroscopy Guidance (Spinal) Indication(s): Assistance in needle guidance and placement for procedures requiring needle placement in or near specific anatomical locations not easily accessible without such assistance. Exposure Time: Please see nurses notes. Contrast: None used. Fluoroscopic Guidance: I was personally present during the use of fluoroscopy. "Tunnel Vision Technique" used to obtain the best possible view of the target area. Parallax error corrected before commencing the procedure. "Direction-depth-direction" technique used to introduce the needle under continuous pulsed fluoroscopy. Once target was reached, antero-posterior, oblique, and lateral fluoroscopic projection used confirm needle placement in all planes. Images permanently stored in EMR. Interpretation: No contrast injected. I personally interpreted the imaging intraoperatively. Adequate needle placement confirmed in multiple planes. Permanent images saved into the patient's record.  Antibiotic Prophylaxis:  Indication(s): None identified Antibiotic given: None  Post-operative Assessment:  EBL: None Complications: No immediate  post-treatment complications observed by team, or reported by patient. Note: The patient tolerated the entire procedure well. A repeat set of vitals were taken after the procedure and the patient was kept under observation following institutional policy, for this type of procedure. Post-procedural neurological assessment was performed, showing return to baseline, prior to discharge. The patient was provided with post-procedure discharge instructions, including a section on how to identify potential problems. Should any problems arise concerning this procedure, the patient was given instructions to immediately contact us, at any time, without hesitation. In any case, we plan to contact the patient by telephone for a follow-up status report regarding this interventional procedure. Comments:  No additional relevant information.  Plan of Care  Disposition: Discharge home  Discharge Date & Time: 04/24/2016; 1048 hrs.  Physician-requested Follow-up:  Return in about 2 weeks (around 05/08/2016) for Post-Procedure evaluation.  Future Appointments Date Time Provider [place]  05/22/2016 1:15 PM Milinda Pointer, MD [facility] None  07/04/2016 9:30 AM Milinda Pointer, MD [facility] None   Medications ordered for procedure: Meds ordered this encounter  Medications  . fentaNYL (SUBLIMAZE) injection 25-50 mcg    Make sure Narcan is available in the pyxis when using this medication. In the event of respiratory depression (RR< 8/min): Titrate NARCAN (naloxone) in increments of 0.1 to 0.2 mg IV at 2-3 minute intervals, until desired degree of reversal.  . lactated ringers infusion 1,000 mL  . midazolam (VERSED) 5 MG/5ML injection 1-2 mg    Make sure Flumazenil is available in the pyxis when using this medication. If oversedation occurs, administer 0.2 mg IV over 15 sec. If after 45 sec no response, administer 0.2 mg again over 1 min; may repeat at 1 min intervals; not to exceed 4 doses (1 mg)  .  dexamethasone (DECADRON) injection 10 mg  . dexamethasone (DECADRON) injection 10 mg  . lidocaine (PF) (XYLOCAINE) 1 % injection 10 mL  . ropivacaine (PF) 2 mg/mL (0.2%) (NAROPIN) injection 9 mL  . ropivacaine (PF) 2 mg/mL (0.2%) (NAROPIN) injection 9 mL   Medications administered: We administered fentaNYL, lactated ringers, midazolam, dexamethasone, dexamethasone, and lidocaine (PF).  See the medical record for exact dosing, route, and time of administration.  Lab-work, Procedure(s), & Referral(s) Ordered: Orders Placed This Encounter  Procedures  . DG C-Arm 1-60 Min-No Report  . Discharge instructions  . Follow-up  . Informed Consent Details: Transcribe to consent form and obtain patient signature  . Provider attestation of informed consent for procedure/surgical case  . Verify informed consent   Imaging Ordered: Results for orders placed in visit on 03/18/16  DG C-Arm 1-60 Min-No Report   Narrative Fluoroscopy was utilized by the requesting physician.  No radiographic  interpretation.    New Prescriptions   No medications on file   Primary Care Physician: Ricardo Jericho, NP Location: [facility] Note by: Kathlen Brunswick. Dossie Arbour, M.D, DABA, DABAPM, DABPM, DABIPP, FIPP Date: 04/24/2016; Time: 4:23 PM  Disclaimer:  Medicine is not an Chief Strategy Officer. The only guarantee in medicine is that nothing is guaranteed. It is important to note that the decision to proceed with this intervention was based on the information collected from the patient. The Data and conclusions were drawn from the patient's questionnaire, the interview, and the physical examination. Because the information was provided in large part by the patient, it cannot be guaranteed that it has not been purposely or unconsciously manipulated. Every effort has been made to obtain as much relevant data as possible for this evaluation. It is important to note that the conclusions that lead to  this procedure are derived in large part from the available data. Always take into account that the treatment will also be dependent on availability of resources and existing treatment guidelines, considered by other Pain Management Practitioners as being common knowledge and practice, at the time of the intervention. For Medico-Legal purposes, it is also important to point out that variation in procedural techniques and pharmacological choices are the acceptable norm. The indications, contraindications, technique, and results of the above procedure should only be interpreted and judged by a Board-Certified Interventional Pain Specialist with extensive familiarity and expertise in the same exact procedure and technique. Attempts at providing opinions without similar or greater experience and expertise than that of the treating physician will be considered as inappropriate and unethical, and shall result in a formal complaint to the state medical board and applicable specialty societies.  Instructions provided at this appointment: Patient Instructions  Post-Procedure instructions Instructions:  Apply ice: Fill a plastic sandwich bag with crushed ice. Cover it with a small towel and apply to injection site. Apply for 15 minutes then remove x 15 minutes. Repeat sequence on day of procedure, until you go to bed. The purpose is to minimize swelling and discomfort after procedure.  Apply heat: Apply heat to procedure site starting the day following the procedure. The purpose is to treat any soreness and discomfort from the procedure.  Food intake: Start with clear liquids (like water) and advance to regular food, as tolerated.   Physical activities: Keep activities to a minimum for the first 8 hours after the procedure.   Driving: If you have received any sedation, you are not allowed to drive for 24 hours after your procedure.  Blood thinner: Restart your blood thinner 6 hours after your procedure. (Only  for those taking blood thinners)  Insulin: As soon as you can eat, you may resume your normal dosing schedule. (Only for those taking insulin)  Infection prevention: Keep procedure site clean and dry.  Post-procedure Pain Diary: Extremely important that this be done correctly and accurately. Recorded information will be used to determine the next step in treatment.  Pain  evaluated is that of treated area only. Do not include pain from an untreated area.  Complete every hour, on the hour, for the initial 8 hours. Set an alarm to help you do this part accurately.  Do not go to sleep and have it completed later. It will not be accurate.  Follow-up appointment: Keep your follow-up appointment after the procedure. Usually 2 weeks for most procedures. (6 weeks in the case of radiofrequency.) Bring you pain diary.  Expect:  From numbing medicine (AKA: Local Anesthetics): Numbness or decrease in pain.  Onset: Full effect within 15 minutes of injected.  Duration: It will depend on the type of local anesthetic used. On the average, 1 to 8 hours.   From steroids: Decrease in swelling or inflammation. Once inflammation is improved, relief of the pain will follow.  Onset of benefits: Depends on the amount of swelling present. The more swelling, the longer it will take for the benefits to be seen.   Duration: Steroids will stay in the system x 2 weeks. Duration of benefits will depend on multiple posibilities including persistent irritating factors.  From procedure: Some discomfort is to be expected once the numbing medicine wears off. This should be minimal if ice and heat are applied as instructed. Call if:  You experience numbness and weakness that gets worse with time, as opposed to wearing off.  New onset bowel or bladder incontinence. (Spinal procedures only)  Emergency Numbers:  Durning business hours (Monday - Thursday, 8:00 AM - 4:00 PM) (Friday, 9:00 AM - 12:00 Noon): [phone]  [phone]  After hours: [phone]   __________________________________________________________________________________________   Pain Management Discharge Instructions  General Discharge Instructions :  If you need to reach your doctor call: Monday-Friday 8:00 am - 4:00 pm at [phone] or toll free [phone].  After clinic hours [phone] to have operator reach doctor.  Bring all of your medication bottles to all your appointments in the pain clinic.  To cancel or reschedule your appointment with Pain Management please remember to call 24 hours in advance to avoid a fee.  Refer to the educational materials which you have been given on: General Risks, I had my Procedure. Discharge Instructions, Post Sedation.  Post Procedure Instructions:  The drugs you were given will stay in your system until tomorrow, so for the next 24 hours you should not drive, make any legal decisions or drink any alcoholic beverages.  You may eat anything you prefer, but it is better to start with liquids then soups and crackers, and gradually work up to solid foods.  Please notify your doctor immediately if you have any unusual bleeding, trouble breathing or pain that is not related to your normal pain.  Depending on the type of procedure that was done, some parts of your body may feel week and/or numb.  This usually clears up by tonight or the next day.  Walk with the use of an assistive device or accompanied by an adult for the 24 hours.  You may use ice on the affected area for the first 24 hours.  Put ice in a Ziploc bag and cover with a towel and place against area 15 minutes on 15 minutes off.  You may switch to heat after 24 hours.

## 2016-04-25 ENCOUNTER — Telehealth: Payer: Self-pay | Admitting: *Deleted

## 2016-04-25 NOTE — Telephone Encounter (Signed)
Attempted to call for post procedure follow-up, message left. 

## 2016-05-22 ENCOUNTER — Encounter: Payer: Self-pay | Admitting: Pain Medicine

## 2016-05-22 ENCOUNTER — Ambulatory Visit: Payer: Medicaid Other | Attending: Pain Medicine | Admitting: Pain Medicine

## 2016-05-22 VITALS — BP 131/77 | HR 86 | Temp 98.0°F | Resp 18 | Ht 63.0 in | Wt 151.0 lb

## 2016-05-22 DIAGNOSIS — N3281 Overactive bladder: Secondary | ICD-10-CM | POA: Diagnosis not present

## 2016-05-22 DIAGNOSIS — G8929 Other chronic pain: Secondary | ICD-10-CM

## 2016-05-22 DIAGNOSIS — Z79899 Other long term (current) drug therapy: Secondary | ICD-10-CM | POA: Diagnosis not present

## 2016-05-22 DIAGNOSIS — M25512 Pain in left shoulder: Secondary | ICD-10-CM | POA: Diagnosis not present

## 2016-05-22 DIAGNOSIS — M5412 Radiculopathy, cervical region: Secondary | ICD-10-CM | POA: Diagnosis present

## 2016-05-22 DIAGNOSIS — Z825 Family history of asthma and other chronic lower respiratory diseases: Secondary | ICD-10-CM | POA: Diagnosis not present

## 2016-05-22 DIAGNOSIS — G894 Chronic pain syndrome: Secondary | ICD-10-CM | POA: Diagnosis not present

## 2016-05-22 DIAGNOSIS — M47812 Spondylosis without myelopathy or radiculopathy, cervical region: Secondary | ICD-10-CM | POA: Diagnosis not present

## 2016-05-22 DIAGNOSIS — Z823 Family history of stroke: Secondary | ICD-10-CM | POA: Diagnosis not present

## 2016-05-22 DIAGNOSIS — M479 Spondylosis, unspecified: Secondary | ICD-10-CM | POA: Insufficient documentation

## 2016-05-22 DIAGNOSIS — M329 Systemic lupus erythematosus, unspecified: Secondary | ICD-10-CM | POA: Diagnosis not present

## 2016-05-22 DIAGNOSIS — Z833 Family history of diabetes mellitus: Secondary | ICD-10-CM | POA: Insufficient documentation

## 2016-05-22 DIAGNOSIS — M25511 Pain in right shoulder: Secondary | ICD-10-CM | POA: Diagnosis not present

## 2016-05-22 DIAGNOSIS — F17210 Nicotine dependence, cigarettes, uncomplicated: Secondary | ICD-10-CM | POA: Diagnosis not present

## 2016-05-22 DIAGNOSIS — Z8249 Family history of ischemic heart disease and other diseases of the circulatory system: Secondary | ICD-10-CM | POA: Diagnosis not present

## 2016-05-22 DIAGNOSIS — F411 Generalized anxiety disorder: Secondary | ICD-10-CM | POA: Insufficient documentation

## 2016-05-22 DIAGNOSIS — Z809 Family history of malignant neoplasm, unspecified: Secondary | ICD-10-CM | POA: Diagnosis not present

## 2016-05-22 DIAGNOSIS — Z90710 Acquired absence of both cervix and uterus: Secondary | ICD-10-CM | POA: Diagnosis not present

## 2016-05-22 DIAGNOSIS — E7800 Pure hypercholesterolemia, unspecified: Secondary | ICD-10-CM | POA: Insufficient documentation

## 2016-05-22 DIAGNOSIS — M4692 Unspecified inflammatory spondylopathy, cervical region: Secondary | ICD-10-CM

## 2016-05-22 DIAGNOSIS — M542 Cervicalgia: Secondary | ICD-10-CM | POA: Diagnosis not present

## 2016-05-22 DIAGNOSIS — M797 Fibromyalgia: Secondary | ICD-10-CM | POA: Diagnosis not present

## 2016-05-22 DIAGNOSIS — Z888 Allergy status to other drugs, medicaments and biological substances status: Secondary | ICD-10-CM | POA: Insufficient documentation

## 2016-05-22 DIAGNOSIS — E559 Vitamin D deficiency, unspecified: Secondary | ICD-10-CM | POA: Insufficient documentation

## 2016-05-22 NOTE — Patient Instructions (Addendum)
Steps to Quit Smoking Smoking tobacco can be bad for your health. It can also affect almost every organ in your body. Smoking puts you and people around you at risk for many serious long-lasting (chronic) diseases. Quitting smoking is hard, but it is one of the best things that you can do for your health. It is never too late to quit. What are the benefits of quitting smoking? When you quit smoking, you lower your risk for getting serious diseases and conditions. They can include:  Lung cancer or lung disease.  Heart disease.  Stroke.  Heart attack.  Not being able to have children (infertility).  Weak bones (osteoporosis) and broken bones (fractures). If you have coughing, wheezing, and shortness of breath, those symptoms may get better when you quit. You may also get sick less often. If you are pregnant, quitting smoking can help to lower your chances of having a baby of low birth weight. What can I do to help me quit smoking? Talk with your doctor about what can help you quit smoking. Some things you can do (strategies) include:  Quitting smoking totally, instead of slowly cutting back how much you smoke over a period of time.  Going to in-person counseling. You are more likely to quit if you go to many counseling sessions.  Using resources and support systems, such as:  Online chats with a counselor.  Phone quitlines.  Printed self-help materials.  Support groups or group counseling.  Text messaging programs.  Mobile phone apps or applications.  Taking medicines. Some of these medicines may have nicotine in them. If you are pregnant or breastfeeding, do not take any medicines to quit smoking unless your doctor says it is okay. Talk with your doctor about counseling or other things that can help you. Talk with your doctor about using more than one strategy at the same time, such as taking medicines while you are also going to in-person counseling. This can help make quitting  easier. What things can I do to make it easier to quit? Quitting smoking might feel very hard at first, but there is a lot that you can do to make it easier. Take these steps:  Talk to your family and friends. Ask them to support and encourage you.  Call phone quitlines, reach out to support groups, or work with a counselor.  Ask people who smoke to not smoke around you.  Avoid places that make you want (trigger) to smoke, such as:  Bars.  Parties.  Smoke-break areas at work.  Spend time with people who do not smoke.  Lower the stress in your life. Stress can make you want to smoke. Try these things to help your stress:  Getting regular exercise.  Deep-breathing exercises.  Yoga.  Meditating.  Doing a body scan. To do this, close your eyes, focus on one area of your body at a time from head to toe, and notice which parts of your body are tense. Try to relax the muscles in those areas.  Download or buy apps on your mobile phone or tablet that can help you stick to your quit plan. There are many free apps, such as QuitGuide from the CDC (Centers for Disease Control and Prevention). You can find more support from smokefree.gov and other websites. This information is not intended to replace advice given to you by your health care provider. Make sure you discuss any questions you have with your health care provider. Document Released: 11/03/2008 Document Revised: 09/05/2015 Document   Reviewed: 05/24/2014 Elsevier Interactive Patient Education  2017 [employer]. Preparing for Procedure with Sedation Instructions: . Oral Intake: Do not eat or drink anything for at least 8 hours prior to your procedure. . Transportation: Public transportation is not allowed. Bring an adult driver. The driver must be physically present in our waiting room before any procedure can be started. [facility] Physical Assistance: Bring an adult physically capable of assisting you, in the event you need help. This  adult should keep you company at home for at least 6 hours after the procedure. . Blood Pressure Medicine: Take your blood pressure medicine with a sip of water the morning of the procedure. . Blood thinners:  . Diabetics on insulin: Notify the staff so that you can be scheduled 1st case in the morning. If your diabetes requires high dose insulin, take only  of your normal insulin dose the morning of the procedure and notify the staff that you have done so. . Preventing infections: Shower with an antibacterial soap the morning of your procedure. . Build-up your immune system: Take 1000 mg of Vitamin C with every meal (3 times a day) the day prior to your procedure. [facility] Antibiotics: Inform the staff if you have a condition or reason that requires you to take antibiotics before dental procedures. . Pregnancy: If you are pregnant, call and cancel the procedure. . Sickness: If you have a cold, fever, or any active infections, call and cancel the procedure. . Arrival: You must be in the facility at least 30 minutes prior to your scheduled procedure. . Children: Do not bring children with you. . Dress appropriately: Bring dark clothing that you would not mind if they get stained. . Valuables: Do not bring any jewelry or valuables. Procedure appointments are reserved for interventional treatments only. [facility] No Prescription Refills. . No medication changes will be discussed during procedure appointments. . No disability issues will be discussed. ______________________________________________________________________________________________  Preparing for Procedure with Sedation Instructions: . Oral Intake: Do not eat or drink anything for at least 8 hours prior to your procedure. . Transportation: Public transportation is not allowed. Bring an adult driver. The driver must be physically present in our waiting room before any procedure can be started. [facility] Physical Assistance: Bring an adult capable of  physically assisting you, in the event you need help. . Blood Pressure Medicine: Take your blood pressure medicine with a sip of water the morning of the procedure. . Insulin: Take only  of your normal insulin dose. . Preventing infections: Shower with an antibacterial soap the morning of your procedure. . Build-up your immune system: Take 1000 mg of Vitamin C with every meal (3 times a day) the day prior to your procedure. . Pregnancy: If you are pregnant, call and cancel the procedure. . Sickness: If you have a cold, fever, or any active infections, call and cancel the procedure. . Arrival: You must be in the facility at least 30 minutes prior to your scheduled procedure. . Children: Do not bring children with you. . Dress appropriately: Bring dark clothing that you would not mind if they get stained. . Valuables: Do not bring any jewelry or valuables. Procedure appointments are reserved for interventional treatments only. [facility] No Prescription Refills. . No medication changes will be discussed during procedure appointments. No disability issues will be discussed.Radiofrequency Lesioning Radiofrequency lesioning is a procedure that is performed to relieve pain. The procedure is often used for back, neck, or arm pain. Radiofrequency lesioning involves the use of  a machine that creates radio waves to make heat. During the procedure, the heat is applied to the nerve that carries the pain signal. The heat damages the nerve and interferes with the pain signal. Pain relief usually starts about 2 weeks after the procedure and lasts for 6 months to 1 year. Tell a health care provider about:  Any allergies you have.  All medicines you are taking, including vitamins, herbs, eye drops, creams, and over-the-counter medicines.  Any problems you or family members have had with anesthetic medicines.  Any blood disorders you have.  Any surgeries you have had.  Any medical conditions you have.  Whether  you are pregnant or may be pregnant. What are the risks? Generally, this is a safe procedure. However, problems may occur, including:  Pain or soreness at the injection site.  Infection at the injection site.  Damage to nerves or blood vessels. What happens before the procedure?  Ask your health care provider about:  Changing or stopping your regular medicines. This is especially important if you are taking diabetes medicines or blood thinners.  Taking medicines such as aspirin and ibuprofen. These medicines can thin your blood. Do not take these medicines before your procedure if your health care provider instructs you not to.  Follow instructions from your health care provider about eating or drinking restrictions.  Plan to have someone take you home after the procedure.  If you go home right after the procedure, plan to have someone with you for 24 hours. What happens during the procedure?  You will be given one or more of the following:  A medicine to help you relax (sedative).  A medicine to numb the area (local anesthetic).  You will be awake during the procedure. You will need to be able to talk with the health care provider during the procedure.  With the help of a type of X-ray (fluoroscopy), the health care provider will insert a radiofrequency needle into the area to be treated.  Next, a wire that carries the radio waves (electrode) will be put through the radiofrequency needle. An electrical pulse will be sent through the electrode to verify the correct nerve. You will feel a tingling sensation, and you may have muscle twitching.  Then, the tissue that is around the needle tip will be heated by an electric current that is passed using the radiofrequency machine. This will numb the nerves.  A bandage (dressing) will be put on the insertion area after the procedure is done. The procedure may vary among health care providers and hospitals. What happens after the  procedure?  Your blood pressure, heart rate, breathing rate, and blood oxygen level will be monitored often until the medicines you were given have worn off.  Return to your normal activities as directed by your health care provider. This information is not intended to replace advice given to you by your health care provider. Make sure you discuss any questions you have with your health care provider. Document Released: 09/05/2010 Document Revised: 06/15/2015 Document Reviewed: 02/14/2014 Elsevier Interactive Patient Education  2017 [employer].

## 2016-05-22 NOTE — Progress Notes (Signed)
Safety precautions to be maintained throughout the outpatient stay will include: orient to surroundings, keep bed in low position, maintain call bell within reach at all times, provide assistance with transfer out of bed and ambulation.  

## 2016-05-22 NOTE — Progress Notes (Signed)
Patient's Name: Tanya [name]  MRN: [id number]  Referring Provider: Ricardo Jericho*  DOB: 1960-05-26  PCP: Ricardo Jericho, NP  DOS: 05/22/2016  Note by: Kathlen Brunswick. Dossie Arbour, MD  Service setting: Ambulatory outpatient  Specialty: Interventional Pain Management  Location: [facility] (AMB) Pain Management Facility    Patient type: Established   Primary Reason(s) for Visit: Encounter for post-procedure evaluation of chronic illness with mild to moderate exacerbation CC: Neck Pain  HPI  Tanya [name] is a 56 y.o. year old, female patient, who comes today for a post-procedure evaluation. She has Anxiety; Chronic upper back pain (Location of Secondary source of pain) (Bilateral) (R>L); Chronic discoid lupus erythematosus; Chronic low back pain (Location of Tertiary source of pain) (Bilateral) (R>L); Chronic neck pain (Location of Primary Source of Pain) (Bilateral) (R>L); Discoid lupus; Diverticulitis; Encounter for long-term (current) use of high-risk medication; Fibromyalgia; Generalized anxiety disorder; Hypercholesterolemia; Hyperlipidemia; Major depressive disorder, recurrent ([place]); Pain medication agreement signed; Osteoarthritis; SLE (systemic lupus erythematosus) ([place]); Urinary incontinence; Long term prescription benzodiazepine use; Long term current use of opiate analgesic; Long term prescription opiate use; Opiate use (10 MME/Day); Chronic pain syndrome; Chronic upper extremity pain (Right); Chronic cervical radicular pain (Right); Chronic shoulder pain (Bilateral) (R>L); 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 Her primarily concern today is the Neck Pain  Pain Assessment: Self-Reported Pain Score: 3 /10             Reported level is compatible with observation.       Pain Type: Chronic pain Pain  Location: Neck Pain Orientation: Left Pain Descriptors / Indicators: Throbbing, Burning Pain Frequency: Constant  Tanya [name] comes in today for post-procedure evaluation after the treatment done on 04/24/2016.  Further details on both, my assessment(s), as well as the proposed treatment plan, please see below.  Post-Procedure Assessment  04/24/2016 Procedure: Diagnostic bilateral cervical facet block under fluoroscopic guidance and IV sedation Pre-procedure pain score:  4/10 Post-procedure pain score: 0/10 (100% relief) Influential Factors: BMI: 26.75 kg/m Intra-procedural challenges: None observed Assessment challenges: None detected         Post-procedural side-effects, adverse reactions, or complications: None reported Reported issues: None  Sedation: Sedation provided. When no sedatives are used, the analgesic levels obtained are directly associated to the effectiveness of the local anesthetics. However, when sedation is provided, the level of analgesia obtained during the initial 1 hour following the intervention, is believed to be the result of a combination of factors. These factors may include, but are not limited to: 1. The effectiveness of the local anesthetics used. 2. The effects of the analgesic(s) and/or anxiolytic(s) used. 3. The degree of discomfort experienced by the patient at the time of the procedure. 4. The patients ability and reliability in recalling and recording the events. 5. The presence and influence of possible secondary gains and/or psychosocial factors. Reported result: Relief experienced during the 1st hour after the procedure: 100 % (Ultra-Short Term Relief) Interpretative annotation: Analgesia during this period is likely to be Local Anesthetic and/or IV Sedative (Analgesic/Anxiolitic) related.          Effects of local anesthetic: The analgesic effects attained during this period are directly associated to the localized infiltration of local  anesthetics and therefore cary significant diagnostic value as to the etiological location, or anatomical origin, of the pain. Expected duration of relief is directly dependent on the pharmacodynamics of  the local anesthetic used. Long-acting (4-6 hours) anesthetics used.  Reported result: Relief during the next 4 to 6 hour after the procedure: 75 % (Short-Term Relief) Interpretative annotation: Complete relief would suggest area to be the source of the pain.          Long-term benefit: Defined as the period of time past the expected duration of local anesthetics. With the possible exception of prolonged sympathetic blockade from the local anesthetics, benefits during this period are typically attributed to, or associated with, other factors such as analgesic sensory neuropraxia, antiinflammatory effects, or beneficial biochemical changes provided by agents other than the local anesthetics Reported result: Extended relief following procedure: 50 % (lasted 2 days) (Long-Term Relief) Interpretative annotation: Good relief. This could suggest inflammation to be a significant component in the etiology to the pain.          Current benefits: Defined as persistent relief that continues at this point in time.   Reported results: Treated area: <25 %       Interpretative annotation: Ongoing benefits would suggest effective therapeutic approach  Interpretation: Results would suggest a successful diagnostic intervention.          Laboratory Chemistry  Inflammation Markers Lab Results  Component Value Date   CRP <0.8 11/23/2015   ESRSEDRATE 8 11/23/2015   (CRP: Acute Phase) (ESR: Chronic Phase) Renal Function Markers Lab Results  Component Value Date   BUN 10 11/23/2015   CREATININE 0.67 11/23/2015   GFRAA >60 11/23/2015   GFRNONAA >60 11/23/2015   Hepatic Function Markers Lab Results  Component Value Date   AST 23 11/23/2015   ALT 15 11/23/2015   ALBUMIN 4.6 11/23/2015   ALKPHOS 35 (L)  11/23/2015   Electrolytes Lab Results  Component Value Date   NA 138 11/23/2015   K 4.0 11/23/2015   CL 102 11/23/2015   CALCIUM 9.5 11/23/2015   MG 2.1 11/23/2015   Neuropathy Markers Lab Results  Component Value Date   VITAMINB12 318 11/23/2015   Bone Pathology Markers Lab Results  Component Value Date   ALKPHOS 35 (L) 11/23/2015   25OHVITD1 28 (L) 11/23/2015   25OHVITD2 <1.0 11/23/2015   25OHVITD3 28 11/23/2015   CALCIUM 9.5 11/23/2015   Coagulation Parameters No results found for: INR, LABPROT, APTT, PLT Cardiovascular Markers No results found for: BNP, HGB, HCT Note: Lab results reviewed.  Recent Diagnostic Imaging Review  Dg C-arm 1-60 Min-no Report  Result Date: 04/24/2016 Fluoroscopy was utilized by the requesting physician.  No radiographic interpretation.   Note: Imaging results reviewed.          Meds  The patient has a current medication list which includes the following prescription(s): vitamin d3, cyclobenzaprine, duloxetine, gabapentin, hydrocodone-acetaminophen, hydrocodone-acetaminophen, hydrocodone-acetaminophen, meloxicam, mometasone, mometasone, pimecrolimus, solifenacin, solifenacin, and hydrocodone-acetaminophen.  Current Outpatient Prescriptions on File Prior to Visit  Medication Sig  . Cholecalciferol (VITAMIN D3) 2000 units capsule Take 1 capsule (2,000 Units total) by mouth daily.  . cyclobenzaprine (FLEXERIL) 10 MG tablet Take 1 tablet (10 mg total) by mouth 3 (three) times daily as needed for muscle spasms.  . DULoxetine (CYMBALTA) 30 MG capsule TAKE 1 CAPSULE(30 MG) BY MOUTH EVERY DAY  . gabapentin (NEURONTIN) 300 MG capsule Take 1 capsule (300 mg total) by mouth 3 (three) times daily.  Marland Kitchen HYDROcodone-acetaminophen (NORCO/VICODIN) 5-325 MG tablet Take 1 tablet by mouth 2 (two) times daily as needed for moderate pain.  Derrill Memo ON 06/14/2016] HYDROcodone-acetaminophen (NORCO/VICODIN) 5-325 MG tablet Take 1 tablet by  mouth 2 (two) times daily as  needed for moderate pain.  . meloxicam (MOBIC) 15 MG tablet Take 1 tablet (15 mg total) by mouth daily.  . mometasone (ELOCON) 0.1 % cream Apply 1 application topically daily.  . pimecrolimus (ELIDEL) 1 % cream Apply 1 application topically 2 (two) times daily.  . solifenacin (VESICARE) 5 MG tablet Take 5 mg by mouth daily.  Marland Kitchen HYDROcodone-acetaminophen (NORCO/VICODIN) 5-325 MG tablet Take 1 tablet by mouth 2 (two) times daily.   No current facility-administered medications on file prior to visit.    ROS  Constitutional: Denies any fever or chills Gastrointestinal: No reported hemesis, hematochezia, vomiting, or acute GI distress Musculoskeletal: Denies any acute onset joint swelling, redness, loss of ROM, or weakness Neurological: No reported episodes of acute onset apraxia, aphasia, dysarthria, agnosia, amnesia, paralysis, loss of coordination, or loss of consciousness  Allergies  Ms. Larue is allergic to trazodone; bupropion; methocarbamol; oxycodone-acetaminophen; tramadol; and trazodone and nefazodone.  [place]  Drug: Ms. Baity  reports that she does not use drugs. Alcohol:  reports that she does not drink alcohol. Tobacco:  reports that she has been smoking Cigarettes.  She has been smoking about 0.50 packs per day. She has never used smokeless tobacco. Medical:  has a past medical history of Anxiety; Depression; Hyperlipidemia; Lupus; and Overactive bladder. Family: family history includes Cancer in her mother; Diabetes in her brother; Emphysema in her mother; Glaucoma in her father; Heart disease in her father; Hypertension in her mother; Stroke in her mother.  Past Surgical History:  Procedure Laterality Date  . ABDOMINAL HYSTERECTOMY     Constitutional Exam  General appearance: Well nourished, well developed, and well hydrated. In no apparent acute distress Vitals:   05/22/16 1327 05/22/16 1330  BP:  131/77  Pulse: 86   Resp: 18   Temp: 98 F (36.7 C)   SpO2:  100%   Weight: 151 lb (68.5 kg)   Height: 5' 3"  (1.6 m)    BMI Assessment: Estimated body mass index is 26.75 kg/m as calculated from the following:   Height as of this encounter: 5' 3"  (1.6 m).   Weight as of this encounter: 151 lb (68.5 kg).  BMI interpretation table: BMI level Category Range association with higher incidence of chronic pain  <18 kg/m2 Underweight   18.5-24.9 kg/m2 Ideal body weight   25-29.9 kg/m2 Overweight Increased incidence by 20%  30-34.9 kg/m2 Obese (Class I) Increased incidence by 68%  35-39.9 kg/m2 Severe obesity (Class II) Increased incidence by 136%  >40 kg/m2 Extreme obesity (Class III) Increased incidence by 254%   BMI Readings from Last 4 Encounters:  05/22/16 26.75 kg/m  04/24/16 26.75 kg/m  04/15/16 27.10 kg/m  03/18/16 25.69 kg/m   Wt Readings from Last 4 Encounters:  05/22/16 151 lb (68.5 kg)  04/24/16 151 lb (68.5 kg)  04/15/16 153 lb (69.4 kg)  03/18/16 145 lb (65.8 kg)  Psych/Mental status: Alert, oriented x 3 (person, place, & time)       Eyes: PERLA Respiratory: No evidence of acute respiratory distress  Cervical Spine Exam  Inspection: No masses, redness, or swelling Alignment: Symmetrical Functional ROM: Unrestricted ROM      Stability: No instability detected Muscle strength & Tone: Functionally intact Sensory: Unimpaired Palpation: No palpable anomalies              Upper Extremity (UE) Exam    Side: Right upper extremity  Side: Left upper extremity  Inspection: No masses, redness, swelling, or  asymmetry. No contractures  Inspection: No masses, redness, swelling, or asymmetry. No contractures  Functional ROM: Unrestricted ROM          Functional ROM: Unrestricted ROM          Muscle strength & Tone: Functionally intact  Muscle strength & Tone: Functionally intact  Sensory: Unimpaired  Sensory: Unimpaired  Palpation: No palpable anomalies              Palpation: No palpable anomalies              Specialized Test(s):  Deferred         Specialized Test(s): Deferred          Thoracic Spine Exam  Inspection: No masses, redness, or swelling Alignment: Symmetrical Functional ROM: Unrestricted ROM Stability: No instability detected Sensory: Unimpaired Muscle strength & Tone: No palpable anomalies  Lumbar Spine Exam  Inspection: No masses, redness, or swelling Alignment: Symmetrical Functional ROM: Unrestricted ROM      Stability: No instability detected Muscle strength & Tone: Functionally intact Sensory: Unimpaired Palpation: No palpable anomalies       Provocative Tests: Lumbar Hyperextension and rotation test: evaluation deferred today       Patrick's Maneuver: evaluation deferred today                    Gait & Posture Assessment  Ambulation: Unassisted Gait: Relatively normal for age and body habitus Posture: WNL   Lower Extremity Exam    Side: Right lower extremity  Side: Left lower extremity  Inspection: No masses, redness, swelling, or asymmetry. No contractures  Inspection: No masses, redness, swelling, or asymmetry. No contractures  Functional ROM: Unrestricted ROM          Functional ROM: Unrestricted ROM          Muscle strength & Tone: Functionally intact  Muscle strength & Tone: Functionally intact  Sensory: Unimpaired  Sensory: Unimpaired  Palpation: No palpable anomalies  Palpation: No palpable anomalies   Assessment  Primary Diagnosis & Pertinent Problem List: The primary encounter diagnosis was Cervical facet syndrome (Bilateral) (R>L). Diagnoses of Chronic neck pain (Location of Primary Source of Pain) (Bilateral) (R>L) and Cervical spondylosis were also pertinent to this visit.  Status Diagnosis  Improved Improved Controlled 1. Cervical facet syndrome (Bilateral) (R>L)   2. Chronic neck pain (Location of Primary Source of Pain) (Bilateral) (R>L)   3. Cervical spondylosis      Plan of Care  Pharmacotherapy (Medications Ordered): No orders of the defined types were  placed in this encounter.  New Prescriptions   No medications on file   Medications administered today: Ms. Speir had no medications administered during this visit. Lab-work, procedure(s), and/or referral(s): No orders of the defined types were placed in this encounter.  Imaging and/or referral(s): None  Interventional therapies: Planned, scheduled, and/or pending:   Left cervical facet RFA vs. Diagnostic bilateral cervical facet block under fluoroscopic guidance and IV sedation    Considering:   Diagnostic bilateral intra-articular shoulder joint injection  Diagnostic bilateral suprascapular nerve block  Possible bilateral suprascapular nerve RFA  Right-sided cervical epidural steroid injection (Series #2) Diagnostic bilateral cervical facet block  Possible bilateral cervical facet radiofrequency ablation  Diagnostic bilateral lumbar facet block  Possible bilateral lumbar facet radiofrequency ablation    Palliative PRN treatment(s):   Right-sided cervical epidural steroid injection (Series #2)   Provider-requested follow-up: Return for RFA, (ASAA), by MD, in addition, keep scheduled appointment.  Future Appointments Date Time Provider  [place]  06/24/2016 2:00 PM Milinda Pointer, MD [facility] None  07/04/2016 9:30 AM Milinda Pointer, MD [facility] None   Primary Care Physician: Ricardo Jericho, NP Location: [facility] Out[facility] Management Facility Note by: Kathlen Brunswick. Dossie Arbour, M.D, DABA, DABAPM, DABPM, DABIPP, FIPP Date: 05/22/2016; Time: 2:10 PM  Patient instructions provided during this appointment: Patient Instructions  Steps to Quit Smoking Smoking tobacco can be bad for your health. It can also affect almost every organ in your body. Smoking puts you and people around you at risk for many serious long-lasting (chronic) diseases. Quitting smoking is hard, but it is one of the best things that you can do for your health. It is never too late to  quit. What are the benefits of quitting smoking? When you quit smoking, you lower your risk for getting serious diseases and conditions. They can include:  Lung cancer or lung disease.  Heart disease.  Stroke.  Heart attack.  Not being able to have children (infertility).  Weak bones (osteoporosis) and broken bones (fractures). If you have coughing, wheezing, and shortness of breath, those symptoms may get better when you quit. You may also get sick less often. If you are pregnant, quitting smoking can help to lower your chances of having a baby of low birth weight. What can I do to help me quit smoking? Talk with your doctor about what can help you quit smoking. Some things you can do (strategies) include:  Quitting smoking totally, instead of slowly cutting back how much you smoke over a period of time.  Going to in-person counseling. You are more likely to quit if you go to many counseling sessions.  Using resources and support systems, such as:  Online chats with a Social worker.  Phone quitlines.  Printed Furniture conservator/restorer.  Support groups or group counseling.  Text messaging programs.  Mobile phone apps or applications.  Taking medicines. Some of these medicines may have nicotine in them. If you are pregnant or breastfeeding, do not take any medicines to quit smoking unless your doctor says it is okay. Talk with your doctor about counseling or other things that can help you. Talk with your doctor about using more than one strategy at the same time, such as taking medicines while you are also going to in-person counseling. This can help make quitting easier. What things can I do to make it easier to quit? Quitting smoking might feel very hard at first, but there is a lot that you can do to make it easier. Take these steps:  Talk to your family and friends. Ask them to support and encourage you.  Call phone quitlines, reach out to support groups, or work with a  Social worker.  Ask people who smoke to not smoke around you.  Avoid places that make you want (trigger) to smoke, such as:  Bars.  Parties.  Smoke-break areas at work.  Spend time with people who do not smoke.  Lower the stress in your life. Stress can make you want to smoke. Try these things to help your stress:  Getting regular exercise.  Deep-breathing exercises.  Yoga.  Meditating.  Doing a body scan. To do this, close your eyes, focus on one area of your body at a time from head to toe, and notice which parts of your body are tense. Try to relax the muscles in those areas.  Download or buy apps on your mobile phone or tablet that can help you stick to your quit plan. There  are many free apps, such as QuitGuide from the [employer] Office manager for Disease Control and Prevention). You can find more support from smokefree.gov and other websites. This information is not intended to replace advice given to you by your health care provider. Make sure you discuss any questions you have with your health care provider. Document Released: 11/03/2008 Document Revised: 09/05/2015 Document Reviewed: 05/24/2014 Elsevier Interactive Patient Education  2017 [employer]. Preparing for Procedure with Sedation Instructions: . Oral Intake: Do not eat or drink anything for at least 8 hours prior to your procedure. . Transportation: Public transportation is not allowed. Bring an adult driver. The driver must be physically present in our waiting room before any procedure can be started. Marland Kitchen Physical Assistance: Bring an adult physically capable of assisting you, in the event you need help. This adult should keep you company at home for at least 6 hours after the procedure. . Blood Pressure Medicine: Take your blood pressure medicine with a sip of water the morning of the procedure. . Blood thinners:  . Diabetics on insulin: Notify the staff so that you can be scheduled 1st case in the morning. If your diabetes  requires high dose insulin, take only  of your normal insulin dose the morning of the procedure and notify the staff that you have done so. . Preventing infections: Shower with an antibacterial soap the morning of your procedure. . Build-up your immune system: Take 1000 mg of Vitamin C with every meal (3 times a day) the day prior to your procedure. Marland Kitchen Antibiotics: Inform the staff if you have a condition or reason that requires you to take antibiotics before dental procedures. . Pregnancy: If you are pregnant, call and cancel the procedure. . Sickness: If you have a cold, fever, or any active infections, call and cancel the procedure. . Arrival: You must be in the facility at least 30 minutes prior to your scheduled procedure. . Children: Do not bring children with you. . Dress appropriately: Bring dark clothing that you would not mind if they get stained. . Valuables: Do not bring any jewelry or valuables. Procedure appointments are reserved for interventional treatments only. Marland Kitchen No Prescription Refills. . No medication changes will be discussed during procedure appointments. . No disability issues will be discussed. ______________________________________________________________________________________________  Preparing for Procedure with Sedation Instructions: . Oral Intake: Do not eat or drink anything for at least 8 hours prior to your procedure. . Transportation: Public transportation is not allowed. Bring an adult driver. The driver must be physically present in our waiting room before any procedure can be started. Marland Kitchen Physical Assistance: Bring an adult capable of physically assisting you, in the event you need help. . Blood Pressure Medicine: Take your blood pressure medicine with a sip of water the morning of the procedure. . Insulin: Take only  of your normal insulin dose. . Preventing infections: Shower with an antibacterial soap the morning of your procedure. . Build-up your  immune system: Take 1000 mg of Vitamin C with every meal (3 times a day) the day prior to your procedure. . Pregnancy: If you are pregnant, call and cancel the procedure. . Sickness: If you have a cold, fever, or any active infections, call and cancel the procedure. . Arrival: You must be in the facility at least 30 minutes prior to your scheduled procedure. . Children: Do not bring children with you. . Dress appropriately: Bring dark clothing that you would not mind if they get stained. . Valuables: Do not  bring any jewelry or valuables. Procedure appointments are reserved for interventional treatments only. Marland Kitchen No Prescription Refills. . No medication changes will be discussed during procedure appointments. No disability issues will be discussed.Radiofrequency Lesioning Radiofrequency lesioning is a procedure that is performed to relieve pain. The procedure is often used for back, neck, or arm pain. Radiofrequency lesioning involves the use of a machine that creates radio waves to make heat. During the procedure, the heat is applied to the nerve that carries the pain signal. The heat damages the nerve and interferes with the pain signal. Pain relief usually starts about 2 weeks after the procedure and lasts for 6 months to 1 year. Tell a health care provider about:  Any allergies you have.  All medicines you are taking, including vitamins, herbs, eye drops, creams, and over-the-counter medicines.  Any problems you or family members have had with anesthetic medicines.  Any blood disorders you have.  Any surgeries you have had.  Any medical conditions you have.  Whether you are pregnant or may be pregnant. What are the risks? Generally, this is a safe procedure. However, problems may occur, including:  Pain or soreness at the injection site.  Infection at the injection site.  Damage to nerves or blood vessels. What happens before the procedure?  Ask your health care provider  about:  Changing or stopping your regular medicines. This is especially important if you are taking diabetes medicines or blood thinners.  Taking medicines such as aspirin and ibuprofen. These medicines can thin your blood. Do not take these medicines before your procedure if your health care provider instructs you not to.  Follow instructions from your health care provider about eating or drinking restrictions.  Plan to have someone take you home after the procedure.  If you go home right after the procedure, plan to have someone with you for 24 hours. What happens during the procedure?  You will be given one or more of the following:  A medicine to help you relax (sedative).  A medicine to numb the area (local anesthetic).  You will be awake during the procedure. You will need to be able to talk with the health care provider during the procedure.  With the help of a type of X-ray (fluoroscopy), the health care provider will insert a radiofrequency needle into the area to be treated.  Next, a wire that carries the radio waves (electrode) will be put through the radiofrequency needle. An electrical pulse will be sent through the electrode to verify the correct nerve. You will feel a tingling sensation, and you may have muscle twitching.  Then, the tissue that is around the needle tip will be heated by an electric current that is passed using the radiofrequency machine. This will numb the nerves.  A bandage (dressing) will be put on the insertion area after the procedure is done. The procedure may vary among health care providers and hospitals. What happens after the procedure?  Your blood pressure, heart rate, breathing rate, and blood oxygen level will be monitored often until the medicines you were given have worn off.  Return to your normal activities as directed by your health care provider. This information is not intended to replace advice given to you by your health care  provider. Make sure you discuss any questions you have with your health care provider. Document Released: 09/05/2010 Document Revised: 06/15/2015 Document Reviewed: 02/14/2014 [employer] Interactive Patient Education  2017 [employer].

## 2016-06-24 ENCOUNTER — Ambulatory Visit: Payer: Medicaid Other | Admitting: Pain Medicine

## 2016-06-26 ENCOUNTER — Ambulatory Visit
Admission: RE | Admit: 2016-06-26 | Discharge: 2016-06-26 | Disposition: A | Discharge status: Home / Self Care | Payer: Medicaid Other | Source: Ambulatory Visit | Attending: Pain Medicine | Admitting: Pain Medicine

## 2016-06-26 ENCOUNTER — Ambulatory Visit (HOSPITAL_BASED_OUTPATIENT_CLINIC_OR_DEPARTMENT_OTHER): Payer: Medicaid Other | Admitting: Pain Medicine

## 2016-06-26 ENCOUNTER — Encounter: Payer: Self-pay | Admitting: Pain Medicine

## 2016-06-26 VITALS — BP 141/70 | HR 86 | Temp 97.3°F | Resp 16 | Ht 63.0 in | Wt 150.0 lb

## 2016-06-26 DIAGNOSIS — M542 Cervicalgia: Secondary | ICD-10-CM

## 2016-06-26 DIAGNOSIS — G8929 Other chronic pain: Secondary | ICD-10-CM | POA: Insufficient documentation

## 2016-06-26 DIAGNOSIS — M47812 Spondylosis without myelopathy or radiculopathy, cervical region: Secondary | ICD-10-CM | POA: Insufficient documentation

## 2016-06-26 DIAGNOSIS — M4692 Unspecified inflammatory spondylopathy, cervical region: Secondary | ICD-10-CM | POA: Diagnosis not present

## 2016-06-26 DIAGNOSIS — G8918 Other acute postprocedural pain: Secondary | ICD-10-CM | POA: Diagnosis not present

## 2016-06-26 MED ORDER — FENTANYL CITRATE (PF) 100 MCG/2ML IJ SOLN
25.0000 ug | INTRAMUSCULAR | Status: DC | PRN
  Administered 2016-06-26: 100 ug via INTRAVENOUS
  Filled 2016-06-26: qty 2

## 2016-06-26 MED ORDER — MIDAZOLAM HCL 5 MG/5ML IJ SOLN
1.0000 mg | INTRAMUSCULAR | Status: DC | PRN
  Administered 2016-06-26: 5 mg via INTRAVENOUS
  Filled 2016-06-26: qty 5

## 2016-06-26 MED ORDER — ROPIVACAINE HCL 2 MG/ML IJ SOLN
9.0000 mL | Freq: Once | INTRAMUSCULAR | Status: AC
  Administered 2016-06-26: 10 mL
  Filled 2016-06-26: qty 10

## 2016-06-26 MED ORDER — DEXAMETHASONE SODIUM PHOSPHATE 10 MG/ML IJ SOLN
10.0000 mg | Freq: Once | INTRAMUSCULAR | Status: AC
  Administered 2016-06-26: 10 mg
  Filled 2016-06-26: qty 1

## 2016-06-26 MED ORDER — LACTATED RINGERS IV SOLN
1000.0000 mL | Freq: Once | INTRAVENOUS | Status: AC
  Administered 2016-06-26: 1000 mL via INTRAVENOUS

## 2016-06-26 MED ORDER — LIDOCAINE HCL (PF) 1 % IJ SOLN
10.0000 mL | Freq: Once | INTRAMUSCULAR | Status: AC
  Administered 2016-06-26: 5 mL
  Filled 2016-06-26: qty 10

## 2016-06-26 MED ORDER — OXYCODONE-ACETAMINOPHEN 5-325 MG PO TABS: 1.0000 | ORAL_TABLET | Freq: Three times a day (TID) | ORAL | 0 refills | Status: DC | PRN

## 2016-06-26 NOTE — Progress Notes (Signed)
Patient's Name: Tanya [name]  MRN: [id number]  Referring Provider: Ricardo Jericho*  DOB: 1960-06-30  PCP: Ricardo Jericho, NP  DOS: 06/26/2016  Note by: Kathlen Brunswick. Dossie Arbour, MD  Service setting: Ambulatory outpatient  Location: [facility] (AMB) Pain Management Facility  Visit type: Procedure  Specialty: Interventional Pain Management  Patient type: Established   Primary Reason for Visit: Interventional Pain Management Treatment. CC: Neck Pain  Procedure:  Anesthesia, Analgesia, Anxiolysis:  Type: Therapeutic Medial Branch Facet Radiofrequency Ablation Region: Posterolateral Cervical Level: C3, C4, C5, C6, & C7 Medial Branch Level(s) Laterality: Left-Sided Paraspinal  Type: Local Anesthesia with Moderate (Conscious) Sedation Local Anesthetic: Lidocaine 1% Route: Intravenous (IV) IV Access: Secured Sedation: Meaningful verbal contact was maintained at all times during the procedure  Indication(s): Analgesia and Anxiety  Indications: 1. Cervical facet syndrome (Bilateral) (R>L)   2. Chronic neck pain (Location of Primary Source of Pain) (Bilateral) (R>L)   3. Cervical spondylosis   4. Acute postoperative pain    Ms. Ambriz has either failed to respond, was unable to tolerate, or simply did not get enough benefit from other more conservative therapies including, but not limited to: 1. Over-the-counter medications 2. Anti-inflammatory medications 3. Muscle relaxants 4. Membrane stabilizers 5. Opioids 6. Physical therapy 7. Modalities (Heat, ice, etc.) 8. Invasive techniques such as nerve blocks. Ms. Minckler has attained more than 50% relief of the pain from a series of diagnostic injections conducted in separate occasions.  Pain Score: Pre-procedure: 5 /10 Post-procedure: 0-No pain/10  Pre-op Assessment:  Previous date of service: 05/22/16 Service provided: Evaluation Ms. Tison is a 56 y.o. (year old), female patient, seen today for interventional  treatment. She  has a past surgical history that includes Abdominal hysterectomy. Her primarily concern today is the Neck Pain  Initial Vital Signs: Blood pressure 116/75, pulse 86, temperature 97.9 F (36.6 C), temperature source Oral, resp. rate 16, height 5\' 3"  (1.6 m), weight 150 lb (68 kg), SpO2 99 %. BMI: 26.57 kg/m  Risk Assessment: Allergies: Reviewed. She is allergic to trazodone; bupropion; methocarbamol; oxycodone-acetaminophen; tramadol; and trazodone and nefazodone.  Allergy Precautions: None required Coagulopathies: Reviewed. None identified.  Blood-thinner therapy: None at this time Active Infection(s): Reviewed. None identified. Ms. Fels is afebrile  Site Confirmation: Ms. Petree was asked to confirm the procedure and laterality before marking the site Procedure checklist: Completed Consent: Before the procedure and under the influence of no sedative(s), amnesic(s), or anxiolytics, the patient was informed of the treatment options, risks and possible complications. To fulfill our ethical and legal obligations, as recommended by the American Medical Association's Code of Ethics, I have informed the patient of my clinical impression; the nature and purpose of the treatment or procedure; the risks, benefits, and possible complications of the intervention; the alternatives, including doing nothing; the risk(s) and benefit(s) of the alternative treatment(s) or procedure(s); and the risk(s) and benefit(s) of doing nothing. The patient was provided information about the general risks and possible complications associated with the procedure. These may include, but are not limited to: failure to achieve desired goals, infection, bleeding, organ or nerve damage, allergic reactions, paralysis, and death. In addition, the patient was informed of those risks and complications associated to Spine-related procedures, such as failure to decrease pain; infection (i.e.: Meningitis, epidural  or intraspinal abscess); bleeding (i.e.: epidural hematoma, subarachnoid hemorrhage, or any other type of intraspinal or peri-dural bleeding); organ or nerve damage (i.e.: Any type of peripheral nerve, nerve root, or spinal cord injury) with subsequent  damage to sensory, motor, and/or autonomic systems, resulting in permanent pain, numbness, and/or weakness of one or several areas of the body; allergic reactions; (i.e.: anaphylactic reaction); and/or death. Furthermore, the patient was informed of those risks and complications associated with the medications. These include, but are not limited to: allergic reactions (i.e.: anaphylactic or anaphylactoid reaction(s)); adrenal axis suppression; blood sugar elevation that in diabetics may result in ketoacidosis or comma; water retention that in patients with history of congestive heart failure may result in shortness of breath, pulmonary edema, and decompensation with resultant heart failure; weight gain; swelling or edema; medication-induced neural toxicity; particulate matter embolism and blood vessel occlusion with resultant organ, and/or nervous system infarction; and/or aseptic necrosis of one or more joints. Finally, the patient was informed that Medicine is not an exact science; therefore, there is also the possibility of unforeseen or unpredictable risks and/or possible complications that may result in a catastrophic outcome. The patient indicated having understood very clearly. We have given the patient no guarantees and we have made no promises. Enough time was given to the patient to ask questions, all of which were answered to the patient's satisfaction. Ms. Wachtel has indicated that she wanted to continue with the procedure. Attestation: I, the ordering provider, attest that I have discussed with the patient the benefits, risks, side-effects, alternatives, likelihood of achieving goals, and potential problems during recovery for the procedure that I  have provided informed consent. Date: 06/26/2016; Time: 1:39 PM  Pre-Procedure Preparation:  Monitoring: As per clinic protocol. Respiration, ETCO2, SpO2, BP, heart rate and rhythm monitor placed and checked for adequate function Safety Precautions: Patient was assessed for positional comfort and pressure points before starting the procedure. Time-out: I initiated and conducted the "Time-out" before starting the procedure, as per protocol. The patient was asked to participate by confirming the accuracy of the "Time Out" information. Verification of the correct person, site, and procedure were performed and confirmed by me, the nursing staff, and the patient. "Time-out" conducted as per Joint Commission's Universal Protocol (UP.01.01.01). "Time-out" Date & Time: 06/26/2016; 1432 hrs.  Description of Procedure Process:   Position: Prone with head of the table was raised to facilitate breathing. Target Area: For Cervical Facet block(s), the target is the postero-lateral waist of the articular pillars at the C3, C4, C5, C6, & C7 levels. Approach: Posterior approach. Area Prepped: Entire Posterior Cervical Region Prepping solution: Hibiclens (4.0% Chlorhexidine gluconate solution) Safety Precautions: Aspiration looking for blood return was conducted prior to all injections. At no point did we inject any substances, as a needle was being advanced. Safe injection practices and needle disposal techniques used. Medications properly checked for expiration dates. SDV (single dose vial) medications used. Description of the Procedure: Protocol guidelines were followed. The patient was placed in position over the fluoroscopy table. The target area was identified and the area prepped in the usual manner. Skin & deeper tissues infiltrated with local anesthetic. Appropriate amount of time allowed to pass for local anesthetics to take effect. The Radiofrequency needles were used to reach the area of the medial branch at  the waist of the articular pillars using fluoroscopy. Using the [employer], sensory stimulation using 50 Hz was used to locate & identify the nerve, making sure that the needle was positioned such that there was no sensory stimulation below 0.3 V or above 0.7 V. Stimulation using 2 Hz was used to evaluate the motor component. Care was taken not to lesion any nerves that demonstrated motor  stimulation of the lower extremities at an output of less than 2.5 times that of the sensory threshold, or a maximum of 2.0 V. Once satisfactory placement of the needles was achieved, the below described solution was slowly injected after negative aspiration. After waiting for at least 2 minutes, the ablation was performed at 80 degrees C for 60 seconds. The needles were then removed and the area cleansed, making sure to leave some of the prepping solution back to take advantage of its long term bactericidal properties. Vitals:   06/26/16 1513 06/26/16 1523 06/26/16 1533 06/26/16 1543  BP: 107/65 116/75 110/90 (!) 141/70  Pulse:      Resp: 17 16 16 16   Temp:  97.4 F (36.3 C)  97.3 F (36.3 C)  TempSrc:      SpO2: 100% 99% 97% 98%  Weight:      Height:        Start Time: 1432 hrs. End Time: 1513 hrs. Materials & Medications:  Needle(s) Type: Teflon-coated, curved tip, Radiofrequency needle(s) Gauge: 22G Length: 10cm Medication(s): We administered lactated ringers, midazolam, fentaNYL, dexamethasone, lidocaine (PF), and ropivacaine (PF) 2 mg/mL (0.2%). Please see chart orders for dosing details.  Imaging Guidance (Spinal):  Type of Imaging Technique: Fluoroscopy Guidance (Spinal) Indication(s): Assistance in needle guidance and placement for procedures requiring needle placement in or near specific anatomical locations not easily accessible without such assistance. Exposure Time: Please see nurses notes. Contrast: None used. Fluoroscopic Guidance: I was personally present during the  use of fluoroscopy. "Tunnel Vision Technique" used to obtain the best possible view of the target area. Parallax error corrected before commencing the procedure. "Direction-depth-direction" technique used to introduce the needle under continuous pulsed fluoroscopy. Once target was reached, antero-posterior, oblique, and lateral fluoroscopic projection used confirm needle placement in all planes. Images permanently stored in EMR. Interpretation: No contrast injected. I personally interpreted the imaging intraoperatively. Adequate needle placement confirmed in multiple planes. Permanent images saved into the patient's record.  Antibiotic Prophylaxis:  Indication(s): None identified Antibiotic given: None  Post-operative Assessment:  EBL: None Complications: No immediate post-treatment complications observed by team, or reported by patient. Note: The patient tolerated the entire procedure well. A repeat set of vitals were taken after the procedure and the patient was kept under observation following institutional policy, for this type of procedure. Post-procedural neurological assessment was performed, showing return to baseline, prior to discharge. The patient was provided with post-procedure discharge instructions, including a section on how to identify potential problems. Should any problems arise concerning this procedure, the patient was given instructions to immediately contact us, at any time, without hesitation. In any case, we plan to contact the patient by telephone for a follow-up status report regarding this interventional procedure. Comments:  No additional relevant information.  Plan of Care  Disposition: Discharge home  Discharge Date & Time: 06/26/2016; 1548 hrs.  Physician-requested Follow-up:  Return for contralateral RF (in 2-wks), by MD.  Future Appointments Date Time Provider Jeff  07/04/2016 1:30 PM Milinda Pointer, MD [facility] None  09/11/2016 10:15 AM Milinda Pointer, MD [facility] None   Medications ordered for procedure: Meds ordered this encounter  Medications  . lactated ringers infusion 1,000 mL  . midazolam (VERSED) 5 MG/5ML injection 1-2 mg    Make sure Flumazenil is available in the pyxis when using this medication. If oversedation occurs, administer 0.2 mg IV over 15 sec. If after 45 sec no response, administer 0.2 mg again over 1 min; may repeat at 1  min intervals; not to exceed 4 doses (1 mg)  . fentaNYL (SUBLIMAZE) injection 25-50 mcg    Make sure Narcan is available in the pyxis when using this medication. In the event of respiratory depression (RR< 8/min): Titrate NARCAN (naloxone) in increments of 0.1 to 0.2 mg IV at 2-3 minute intervals, until desired degree of reversal.  . dexamethasone (DECADRON) injection 10 mg  . lidocaine (PF) (XYLOCAINE) 1 % injection 10 mL  . ropivacaine (PF) 2 mg/mL (0.2%) (NAROPIN) injection 9 mL  . oxyCODONE-acetaminophen (PERCOCET) 5-325 MG tablet    Sig: Take 1 tablet by mouth every 8 (eight) hours as needed for severe pain.    Dispense:  30 tablet    Refill:  0    For acute post-operative pain. Not to be refilled. To last until: 07/06/16   Medications administered: We administered lactated ringers, midazolam, fentaNYL, dexamethasone, lidocaine (PF), and ropivacaine (PF) 2 mg/mL (0.2%).  See the medical record for exact dosing, route, and time of administration.  Lab-work, Procedure(s), & Referral(s) Ordered: Orders Placed This Encounter  Procedures  . Radiofrequency,Cervical  . Radiofrequency,Cervical  . DG C-Arm 1-60 Min-No Report  . Informed Consent Details: Transcribe to consent form and obtain patient signature  . Provider attestation of informed consent for procedure/surgical case  . Verify informed consent  . Discharge instructions  . Follow-up   Imaging Ordered: Results for orders placed in visit on 04/24/16  DG C-Arm 1-60 Min-No Report   Narrative Fluoroscopy was utilized by the  requesting physician.  No radiographic  interpretation.    New Prescriptions   OXYCODONE-ACETAMINOPHEN (PERCOCET) 5-325 MG TABLET    Take 1 tablet by mouth every 8 (eight) hours as needed for severe pain.   Primary Care Physician: Ricardo Jericho, NP Location: [facility] Note by: Kathlen Brunswick. Dossie Arbour, M.D, DABA, DABAPM, DABPM, DABIPP, FIPP Date: 06/26/2016; Time: 4:12 PM  Disclaimer:  Medicine is not an Chief Strategy Officer. The only guarantee in medicine is that nothing is guaranteed. It is important to note that the decision to proceed with this intervention was based on the information collected from the patient. The Data and conclusions were drawn from the patient's questionnaire, the interview, and the physical examination. Because the information was provided in large part by the patient, it cannot be guaranteed that it has not been purposely or unconsciously manipulated. Every effort has been made to obtain as much relevant data as possible for this evaluation. It is important to note that the conclusions that lead to this procedure are derived in large part from the available data. Always take into account that the treatment will also be dependent on availability of resources and existing treatment guidelines, considered by other Pain Management Practitioners as being common knowledge and practice, at the time of the intervention. For Medico-Legal purposes, it is also important to point out that variation in procedural techniques and pharmacological choices are the acceptable norm. The indications, contraindications, technique, and results of the above procedure should only be interpreted and judged by a Board-Certified Interventional Pain Specialist with extensive familiarity and expertise in the same exact procedure and technique.  Instructions provided at this appointment: Patient Instructions    ____________________________________________________________________________________________  Post-Procedure instructions Instructions:  Apply ice: Fill a plastic sandwich bag with crushed ice. Cover it with a small towel and apply to injection site. Apply for 15 minutes then remove x 15 minutes. Repeat sequence on day of procedure, until you go to bed. The purpose is to  minimize swelling and discomfort after procedure.  Apply heat: Apply heat to procedure site starting the day following the procedure. The purpose is to treat any soreness and discomfort from the procedure.  Food intake: Start with clear liquids (like water) and advance to regular food, as tolerated.   Physical activities: Keep activities to a minimum for the first 8 hours after the procedure.   Driving: If you have received any sedation, you are not allowed to drive for 24 hours after your procedure.  Blood thinner: Restart your blood thinner 6 hours after your procedure. (Only for those taking blood thinners)  Insulin: As soon as you can eat, you may resume your normal dosing schedule. (Only for those taking insulin)  Infection prevention: Keep procedure site clean and dry.  Post-procedure Pain Diary: Extremely important that this be done correctly and accurately. Recorded information will be used to determine the next step in treatment.  Pain evaluated is that of treated area only. Do not include pain from an untreated area.  Complete every hour, on the hour, for the initial 8 hours. Set an alarm to help you do this part accurately.  Do not go to sleep and have it completed later. It will not be accurate.  Follow-up appointment: Keep your follow-up appointment after the procedure. Usually 2 weeks for most procedures. (6 weeks in the case of radiofrequency.) Bring you pain diary.  Expect:  From numbing medicine (AKA: Local Anesthetics): Numbness or decrease in pain.  Onset: Full effect within 15 minutes of  injected.  Duration: It will depend on the type of local anesthetic used. On the average, 1 to 8 hours.   From steroids: Decrease in swelling or inflammation. Once inflammation is improved, relief of the pain will follow.  Onset of benefits: Depends on the amount of swelling present. The more swelling, the longer it will take for the benefits to be seen.   Duration: Steroids will stay in the system x 2 weeks. Duration of benefits will depend on multiple posibilities including persistent irritating factors.  From procedure: Some discomfort is to be expected once the numbing medicine wears off. This should be minimal if ice and heat are applied as instructed. Call if:  You experience numbness and weakness that gets worse with time, as opposed to wearing off.  New onset bowel or bladder incontinence. (Spinal procedures only)  Emergency Numbers:  Durning business hours (Monday - Thursday, 8:00 AM - 4:00 PM) (Friday, 9:00 AM - 12:00 Noon): [phone]  After hours: [phone]  Oxycodone prescription given Post RF Discharge Instructions  You have just completed Radiofrequency Neurotomy.  The following instructions will provide you with information and guidelines for self-care upon discharge.  If at any time you have questions or concerns please call your physician.  General Instructions:  Be alert for signs of possible infection: redness, swelling, heat, red streaks, elevated temperature, fever.  Please notify your doctor immediately if you have unusual bleeding, trouble breathing, or loss of the ability to control your bowel or bladder.  What to expect:  Most procedures involve the use of local anesthetics (numbing medicine), steroids (anti-inflammatory medicines) and possibly sedation (relaxation or nerve medicine).  Sedation may affect your memory, not allowing you to remember the procedure, or the instructions that we give you after it.  Because of this, you doctor may want to  avoid providing you important information after the procedure, since you may not remember.  The doctor will be more than happy to  go over the information upon your return.  Local anesthetics, on the other hand, may cause temporary numbness and weakness of the legs or arms, depending on the location of the block.  This numbness/weaknes may last 4-6 hours ( the duration of the local anesthetic).  During this period of numbness, you must be more careful than usual to prevent any injuries to th extremity.  Steroids will begin to work immediately after injected, but on the average, it will take 6-10 days for the swelling to come down to the point where you will be able to tell a difference in terms of the pain.  In summary, you should expect for your pain to get better within 15-20 minutes after the procedure.  This relieve or numbness should last 4-6 hours, after which, it will wear off.  Once it wears off, you may experience more pain than usual for 4-6 days, or until the steroids "kick in".  This discomfort is due to the procedure itself.  To minimize this, we recommend applying ice (fill a plastic sandwich bag with ice and wrap it on a towel to prevent frostbite) to the area, 15 minutes on and 15 minutes off, the day of the procedure.  This will minimize any swelling.  Starting the next day, you should then start with heat (moist or dry, it does not matter).  Heat therapy should continue until the pain improves (4-6 days).  Be careful not to burn yourself.  In the case of Radiofrequency procedures, you should expect more pain than usual for 5 to 6 weeks after the procedure.  This is how long it takes the burned tissue to heal.  We cannot assess any definite results on the success of the procedure until this recovery period has elapsed.  Post-Procedure Care:  You will want to be careful in moving about.  Muscle spasms in the area of the injection may occur.  Use ice or heat to the area is often helpful.    Occasionally, a spinal headache can develop.  This is different from a normal headache in the sense that it will not go away on it's own, despite normal measures.  If you develop a headache that does not seem to respond to conservative therapy, please let your physician know.  This can be treated with an epidural blood patch.   You may experience some numbness or redness,m however it should be short lived.  If persistent numbness occurs, contact your physician. (Persistent numbness would be defined as lasting more than 4-6 hours).  Use care in moving the effected arm or leg to avoid further injury.  You may be given a sling or crutches to use temporarily.  Some discoloration may be present in your arms or leg for 24-48 hours.  If you experience shortness of breath or if your lips or fingers develop a dusky or "blue" appearance, contact your physician or go to the emergency room.  Diet  No eating limitations, unless stipulated above.  Nevertheless, if you have had sedation, you may experience some nausea.  In this case, it may be wise to wait at least two hours prior to resuming regular diet.  Comfort  You may experience a temporary increase in pain.  You will also experience tenderness at the site of injection, which may be accompanied with muscle spasms.  Use of cold or heat is usually helpful.  Take your prescription as directed.  Always exercise caution when taking pain pills.  Activity:  For the  first 24 hours after the procedure, do not drive a motor vehicle,  Operate heavy machinery or power tools or handle any weapons.  Consider walking with the use of an assistive device or accompanied by an adult for the next 24 hours.  Do not drink alcoholic beverages including beer.  Do not make any important decisions or sign any legal documents. Go home and rest today.  Resume activities tomorrow, as tolerated.  Use caution in moving about as you may experience mild leg weakness.  Use caution in cooking, use  of household electrical appliances and climbing steps.  Medications  May resume pre-procedure medications.  Do not take any drugs, other than what has been prescribed to you.   Other  GENERAL RISKS AND COMPLICATIONS  What are the risk, side effects and possible complications? Generally speaking, most procedures are safe.  However, with any procedure there are risks, side effects, and the possibility of complications.  The risks and complications are dependent upon the sites that are lesioned, or the type of nerve block to be performed.  The closer the procedure is to the spine, the more serious the risks are.  Great care is taken when placing the radio frequency needles, block needles or lesioning probes, but sometimes complications can occur. 1. Infection: Any time there is an injection through the skin, there is a risk of infection.  This is why sterile conditions are used for these blocks.  There are four possible types of infection. 1. Localized skin infection. 2. Central Nervous System Infection-This can be in the form of Meningitis, which can be deadly. 3. Epidural Infections-This can be in the form of an epidural abscess, which can cause pressure inside of the spine, causing compression of the spinal cord with subsequent paralysis. This would require an emergency surgery to decompress, and there are no guarantees that the patient would recover from the paralysis. 4. Discitis-This is an infection of the intervertebral discs.  It occurs in about 1% of discography procedures.  It is difficult to treat and it may lead to surgery.        2. Pain: the needles have to go through skin and soft tissues, will cause soreness.       3. Damage to internal structures:  The nerves to be lesioned may be near blood vessels or    other nerves which can be potentially damaged.       4. Bleeding: Bleeding is more common if the patient is taking blood thinners such as  aspirin, Coumadin, Ticiid, Plavix, etc.,  or if he/she have some genetic predisposition  such as hemophilia. Bleeding into the spinal canal can cause compression of the spinal  cord with subsequent paralysis.  This would require an emergency surgery to  decompress and there are no guarantees that the patient would recover from the  paralysis.       5. Pneumothorax:  Puncturing of a lung is a possibility, every time a needle is introduced in  the area of the chest or upper back.  Pneumothorax refers to free air around the  collapsed lung(s), inside of the thoracic cavity (chest cavity).  Another two possible  complications related to a similar event would include: Hemothorax and Chylothorax.   These are variations of the Pneumothorax, where instead of air around the collapsed  lung(s), you may have blood or chyle, respectively.       6. Spinal headaches: They may occur with any procedures in the area of the spine.  7. Persistent CSF (Cerebro-Spinal Fluid) leakage: This is a rare problem, but may occur  with prolonged intrathecal or epidural catheters either due to the formation of a fistulous  track or a dural tear.       8. Nerve damage: By working so close to the spinal cord, there is always a possibility of  nerve damage, which could be as serious as a permanent spinal cord injury with  paralysis.       9. Death:  Although rare, severe deadly allergic reactions known as "Anaphylactic  reaction" can occur to any of the medications used.      10. Worsening of the symptoms:  We can always make thing worse.  What are the chances of something like this happening? Chances of any of this occuring are extremely low.  By statistics, you have more of a chance of getting killed in a motor vehicle accident: while driving to the hospital than any of the above occurring .  Nevertheless, you should be aware that they are possibilities.  In general, it is similar to taking a shower.  Everybody knows that you can slip, hit your head and get killed.  Does  that mean that you should not shower again?  Nevertheless always keep in mind that statistics do not mean anything if you happen to be on the wrong side of them.  Even if a procedure has a 1 (one) in a 1,000,000 (million) chance of going wrong, it you happen to be that one..Also, keep in mind that by statistics, you have more of a chance of having something go wrong when taking medications.  Who should not have this procedure? If you are on a blood thinning medication (e.g. Coumadin, Plavix, see list of "Blood Thinners"), or if you have an active infection going on, you should not have the procedure.  If you are taking any blood thinners, please inform your physician.  How should I prepare for this procedure?  Do not eat or drink anything at least six hours prior to the procedure.  Bring a driver with you .  It cannot be a taxi.  Come accompanied by an adult that can drive you back, and that is strong enough to help you if your legs get weak or numb from the local anesthetic.  Take all of your medicines the morning of the procedure with just enough water to swallow them.  If you have diabetes, make sure that you are scheduled to have your procedure done first thing in the morning, whenever possible.  If you have diabetes, take only half of your insulin dose and notify our nurse that you have done so as soon as you arrive at the clinic.  If you are diabetic, but only take blood sugar pills (oral hypoglycemic), then do not take them on the morning of your procedure.  You may take them after you have had the procedure.  Do not take aspirin or any aspirin-containing medications, at least eleven (11) days prior to the procedure.  They may prolong bleeding.  Wear loose fitting clothing that may be easy to take off and that you would not mind if it got stained with Betadine or blood.  Do not wear any jewelry or perfume  Remove any nail coloring.  It will interfere with some of our monitoring  equipment.  NOTE: Remember that this is not meant to be interpreted as a complete list of all possible complications.  Unforeseen problems may occur.  BLOOD THINNERS  The following drugs contain aspirin or other products, which can cause increased bleeding during surgery and should not be taken for 2 weeks prior to and 1 week after surgery.  If you should need take something for relief of minor pain, you may take acetaminophen which is found in Tylenol,m Datril, Anacin-3 and Panadol. It is not blood thinner. The products listed below are.  Do not take any of the products listed below in addition to any listed on your instruction sheet.  A.P.C or A.P.C with Codeine Codeine Phosphate Capsules #3 Ibuprofen Ridaura  ABC compound Congesprin Imuran rimadil  Advil Cope Indocin Robaxisal  Alka-Seltzer Effervescent Pain Reliever and Antacid Coricidin or Coricidin-D  Indomethacin Rufen  Alka-Seltzer plus Cold Medicine Cosprin Ketoprofen S-A-C Tablets  Anacin Analgesic Tablets or Capsules Coumadin Korlgesic Salflex  Anacin Extra Strength Analgesic tablets or capsules CP-2 Tablets Lanoril Salicylate  Anaprox Cuprimine Capsules Levenox Salocol  Anexsia-D Dalteparin Magan Salsalate  Anodynos Darvon compound Magnesium Salicylate Sine-off  Ansaid Dasin Capsules Magsal Sodium Salicylate  Anturane Depen Capsules Marnal Soma  APF Arthritis pain formula Dewitt's Pills Measurin Stanback  Argesic Dia-Gesic Meclofenamic Sulfinpyrazone  Arthritis Bayer Timed Release Aspirin Diclofenac Meclomen Sulindac  Arthritis pain formula Anacin Dicumarol Medipren Supac  Analgesic (Safety coated) Arthralgen Diffunasal Mefanamic Suprofen  Arthritis Strength Bufferin Dihydrocodeine Mepro Compound Suprol  Arthropan liquid Dopirydamole Methcarbomol with Aspirin Synalgos  ASA tablets/Enseals Disalcid Micrainin Tagament  Ascriptin Doan's Midol Talwin  Ascriptin A/D Dolene Mobidin Tanderil  Ascriptin Extra Strength Dolobid  Moblgesic Ticlid  Ascriptin with Codeine Doloprin or Doloprin with Codeine Momentum Tolectin  Asperbuf Duoprin Mono-gesic Trendar  Aspergum Duradyne Motrin or Motrin IB Triminicin  Aspirin plain, buffered or enteric coated Durasal Myochrisine Trigesic  Aspirin Suppositories Easprin Nalfon Trillsate  Aspirin with Codeine Ecotrin Regular or Extra Strength Naprosyn Uracel  Atromid-S Efficin Naproxen Ursinus  Auranofin Capsules Elmiron Neocylate Vanquish  Axotal Emagrin Norgesic Verin  Azathioprine Empirin or Empirin with Codeine Normiflo Vitamin E  Azolid Emprazil Nuprin Voltaren  Bayer Aspirin plain, buffered or children's or timed BC Tablets or powders Encaprin Orgaran Warfarin Sodium  Buff-a-Comp Enoxaparin Orudis Zorpin  Buff-a-Comp with Codeine Equegesic Os-Cal-Gesic   Buffaprin Excedrin plain, buffered or Extra Strength Oxalid   Bufferin Arthritis Strength Feldene Oxphenbutazone   Bufferin plain or Extra Strength Feldene Capsules Oxycodone with Aspirin   Bufferin with Codeine Fenoprofen Fenoprofen Pabalate or Pabalate-SF   Buffets II Flogesic Panagesic   Buffinol plain or Extra Strength Florinal or Florinal with Codeine Panwarfarin   Buf-Tabs Flurbiprofen Penicillamine   Butalbital Compound Four-way cold tablets Penicillin   Butazolidin Fragmin Pepto-Bismol   Carbenicillin Geminisyn Percodan   Carna Arthritis Reliever Geopen Persantine   Carprofen Gold's salt Persistin   Chloramphenicol Goody's Phenylbutazone   Chloromycetin Haltrain Piroxlcam   Clmetidine heparin Plaquenil   Cllnoril Hyco-pap Ponstel   Clofibrate Hydroxy chloroquine Propoxyphen         Before stopping any of these medications, be sure to consult the physician who ordered them.  Some, such as Coumadin (Warfarin) are ordered to prevent or treat serious conditions such as "deep thrombosis", "pumonary embolisms", and other heart problems.  The amount of time that you may need off of the medication may also vary with  the medication and the reason for which you were taking it.  If you are taking any of these medications, please make sure you notify your pain physician before you undergo any procedures.          ____________________________________________________________________________________________

## 2016-06-26 NOTE — Progress Notes (Signed)
Safety precautions to be maintained throughout the outpatient stay will include: orient to surroundings, keep bed in low position, maintain call bell within reach at all times, provide assistance with transfer out of bed and ambulation.  

## 2016-06-26 NOTE — Patient Instructions (Addendum)
____________________________________________________________________________________________  Post-Procedure instructions Instructions:  Apply ice: Fill a plastic sandwich bag with crushed ice. Cover it with a small towel and apply to injection site. Apply for 15 minutes then remove x 15 minutes. Repeat sequence on day of procedure, until you go to bed. The purpose is to minimize swelling and discomfort after procedure.  Apply heat: Apply heat to procedure site starting the day following the procedure. The purpose is to treat any soreness and discomfort from the procedure.  Food intake: Start with clear liquids (like water) and advance to regular food, as tolerated.   Physical activities: Keep activities to a minimum for the first 8 hours after the procedure.   Driving: If you have received any sedation, you are not allowed to drive for 24 hours after your procedure.  Blood thinner: Restart your blood thinner 6 hours after your procedure. (Only for those taking blood thinners)  Insulin: As soon as you can eat, you may resume your normal dosing schedule. (Only for those taking insulin)  Infection prevention: Keep procedure site clean and dry.  Post-procedure Pain Diary: Extremely important that this be done correctly and accurately. Recorded information will be used to determine the next step in treatment.  Pain evaluated is that of treated area only. Do not include pain from an untreated area.  Complete every hour, on the hour, for the initial 8 hours. Set an alarm to help you do this part accurately.  Do not go to sleep and have it completed later. It will not be accurate.  Follow-up appointment: Keep your follow-up appointment after the procedure. Usually 2 weeks for most procedures. (6 weeks in the case of radiofrequency.) Bring you pain diary.  Expect:  From numbing medicine (AKA: Local Anesthetics): Numbness or decrease in pain.  Onset: Full effect within 15 minutes of  injected.  Duration: It will depend on the type of local anesthetic used. On the average, 1 to 8 hours.   From steroids: Decrease in swelling or inflammation. Once inflammation is improved, relief of the pain will follow.  Onset of benefits: Depends on the amount of swelling present. The more swelling, the longer it will take for the benefits to be seen.   Duration: Steroids will stay in the system x 2 weeks. Duration of benefits will depend on multiple posibilities including persistent irritating factors.  From procedure: Some discomfort is to be expected once the numbing medicine wears off. This should be minimal if ice and heat are applied as instructed. Call if:  You experience numbness and weakness that gets worse with time, as opposed to wearing off.  New onset bowel or bladder incontinence. (Spinal procedures only)  Emergency Numbers:  Durning business hours (Monday - Thursday, 8:00 AM - 4:00 PM) (Friday, 9:00 AM - 12:00 Noon): [phone]  After hours: [phone]  Oxycodone prescription given Post RF Discharge Instructions  You have just completed Radiofrequency Neurotomy.  The following instructions will provide you with information and guidelines for self-care upon discharge.  If at any time you have questions or concerns please call your physician.  General Instructions:  Be alert for signs of possible infection: redness, swelling, heat, red streaks, elevated temperature, fever.  Please notify your doctor immediately if you have unusual bleeding, trouble breathing, or loss of the ability to control your bowel or bladder.  What to expect:  Most procedures involve the use of local anesthetics (numbing medicine), steroids (anti-inflammatory medicines) and possibly sedation (relaxation or nerve medicine).  Sedation may  affect your memory, not allowing you to remember the procedure, or the instructions that we give you after it.  Because of this, you doctor may want to  avoid providing you important information after the procedure, since you may not remember.  The doctor will be more than happy to go over the information upon your return.  Local anesthetics, on the other hand, may cause temporary numbness and weakness of the legs or arms, depending on the location of the block.  This numbness/weaknes may last 4-6 hours ( the duration of the local anesthetic).  During this period of numbness, you must be more careful than usual to prevent any injuries to th extremity.  Steroids will begin to work immediately after injected, but on the average, it will take 6-10 days for the swelling to come down to the point where you will be able to tell a difference in terms of the pain.  In summary, you should expect for your pain to get better within 15-20 minutes after the procedure.  This relieve or numbness should last 4-6 hours, after which, it will wear off.  Once it wears off, you may experience more pain than usual for 4-6 days, or until the steroids "kick in".  This discomfort is due to the procedure itself.  To minimize this, we recommend applying ice (fill a plastic sandwich bag with ice and wrap it on a towel to prevent frostbite) to the area, 15 minutes on and 15 minutes off, the day of the procedure.  This will minimize any swelling.  Starting the next day, you should then start with heat (moist or dry, it does not matter).  Heat therapy should continue until the pain improves (4-6 days).  Be careful not to burn yourself.  In the case of Radiofrequency procedures, you should expect more pain than usual for 5 to 6 weeks after the procedure.  This is how long it takes the burned tissue to heal.  We cannot assess any definite results on the success of the procedure until this recovery period has elapsed.  Post-Procedure Care:  You will want to be careful in moving about.  Muscle spasms in the area of the injection may occur.  Use ice or heat to the area is often helpful.    Occasionally, a spinal headache can develop.  This is different from a normal headache in the sense that it will not go away on it's own, despite normal measures.  If you develop a headache that does not seem to respond to conservative therapy, please let your physician know.  This can be treated with an epidural blood patch.   You may experience some numbness or redness,m however it should be short lived.  If persistent numbness occurs, contact your physician. (Persistent numbness would be defined as lasting more than 4-6 hours).  Use care in moving the effected arm or leg to avoid further injury.  You may be given a sling or crutches to use temporarily.  Some discoloration may be present in your arms or leg for 24-48 hours.  If you experience shortness of breath or if your lips or fingers develop a dusky or "blue" appearance, contact your physician or go to the emergency room.  Diet  No eating limitations, unless stipulated above.  Nevertheless, if you have had sedation, you may experience some nausea.  In this case, it may be wise to wait at least two hours prior to resuming regular diet.  Comfort  You may experience  a temporary increase in pain.  You will also experience tenderness at the site of injection, which may be accompanied with muscle spasms.  Use of cold or heat is usually helpful.  Take your prescription as directed.  Always exercise caution when taking pain pills.  Activity:  For the first 24 hours after the procedure, do not drive a motor vehicle,  Operate heavy machinery or power tools or handle any weapons.  Consider walking with the use of an assistive device or accompanied by an adult for the next 24 hours.  Do not drink alcoholic beverages including beer.  Do not make any important decisions or sign any legal documents. Go home and rest today.  Resume activities tomorrow, as tolerated.  Use caution in moving about as you may experience mild leg weakness.  Use caution in cooking, use  of household electrical appliances and climbing steps.  Medications  May resume pre-procedure medications.  Do not take any drugs, other than what has been prescribed to you.   Other  GENERAL RISKS AND COMPLICATIONS  What are the risk, side effects and possible complications? Generally speaking, most procedures are safe.  However, with any procedure there are risks, side effects, and the possibility of complications.  The risks and complications are dependent upon the sites that are lesioned, or the type of nerve block to be performed.  The closer the procedure is to the spine, the more serious the risks are.  Great care is taken when placing the radio frequency needles, block needles or lesioning probes, but sometimes complications can occur. 1. Infection: Any time there is an injection through the skin, there is a risk of infection.  This is why sterile conditions are used for these blocks.  There are four possible types of infection. 1. Localized skin infection. 2. Central Nervous System Infection-This can be in the form of Meningitis, which can be deadly. 3. Epidural Infections-This can be in the form of an epidural abscess, which can cause pressure inside of the spine, causing compression of the spinal cord with subsequent paralysis. This would require an emergency surgery to decompress, and there are no guarantees that the patient would recover from the paralysis. 4. Discitis-This is an infection of the intervertebral discs.  It occurs in about 1% of discography procedures.  It is difficult to treat and it may lead to surgery.        2. Pain: the needles have to go through skin and soft tissues, will cause soreness.       3. Damage to internal structures:  The nerves to be lesioned may be near blood vessels or    other nerves which can be potentially damaged.       4. Bleeding: Bleeding is more common if the patient is taking blood thinners such as  aspirin, Coumadin, Ticiid, Plavix, etc.,  or if he/she have some genetic predisposition  such as hemophilia. Bleeding into the spinal canal can cause compression of the spinal  cord with subsequent paralysis.  This would require an emergency surgery to  decompress and there are no guarantees that the patient would recover from the  paralysis.       5. Pneumothorax:  Puncturing of a lung is a possibility, every time a needle is introduced in  the area of the chest or upper back.  Pneumothorax refers to free air around the  collapsed lung(s), inside of the thoracic cavity (chest cavity).  Another two possible  complications related to a similar  event would include: Hemothorax and Chylothorax.   These are variations of the Pneumothorax, where instead of air around the collapsed  lung(s), you may have blood or chyle, respectively.       6. Spinal headaches: They may occur with any procedures in the area of the spine.       7. Persistent CSF (Cerebro-Spinal Fluid) leakage: This is a rare problem, but may occur  with prolonged intrathecal or epidural catheters either due to the formation of a fistulous  track or a dural tear.       8. Nerve damage: By working so close to the spinal cord, there is always a possibility of  nerve damage, which could be as serious as a permanent spinal cord injury with  paralysis.       9. Death:  Although rare, severe deadly allergic reactions known as "Anaphylactic  reaction" can occur to any of the medications used.      10. Worsening of the symptoms:  We can always make thing worse.  What are the chances of something like this happening? Chances of any of this occuring are extremely low.  By statistics, you have more of a chance of getting killed in a motor vehicle accident: while driving to the hospital than any of the above occurring .  Nevertheless, you should be aware that they are possibilities.  In general, it is similar to taking a shower.  Everybody knows that you can slip, hit your head and get killed.  Does  that mean that you should not shower again?  Nevertheless always keep in mind that statistics do not mean anything if you happen to be on the wrong side of them.  Even if a procedure has a 1 (one) in a 1,000,000 (million) chance of going wrong, it you happen to be that one..Also, keep in mind that by statistics, you have more of a chance of having something go wrong when taking medications.  Who should not have this procedure? If you are on a blood thinning medication (e.g. Coumadin, Plavix, see list of "Blood Thinners"), or if you have an active infection going on, you should not have the procedure.  If you are taking any blood thinners, please inform your physician.  How should I prepare for this procedure?  Do not eat or drink anything at least six hours prior to the procedure.  Bring a driver with you .  It cannot be a taxi.  Come accompanied by an adult that can drive you back, and that is strong enough to help you if your legs get weak or numb from the local anesthetic.  Take all of your medicines the morning of the procedure with just enough water to swallow them.  If you have diabetes, make sure that you are scheduled to have your procedure done first thing in the morning, whenever possible.  If you have diabetes, take only half of your insulin dose and notify our nurse that you have done so as soon as you arrive at the clinic.  If you are diabetic, but only take blood sugar pills (oral hypoglycemic), then do not take them on the morning of your procedure.  You may take them after you have had the procedure.  Do not take aspirin or any aspirin-containing medications, at least eleven (11) days prior to the procedure.  They may prolong bleeding.  Wear loose fitting clothing that may be easy to take off and that you would not mind if it  got stained with Betadine or blood.  Do not wear any jewelry or perfume  Remove any nail coloring.  It will interfere with some of our monitoring  equipment.  NOTE: Remember that this is not meant to be interpreted as a complete list of all possible complications.  Unforeseen problems may occur.  BLOOD THINNERS The following drugs contain aspirin or other products, which can cause increased bleeding during surgery and should not be taken for 2 weeks prior to and 1 week after surgery.  If you should need take something for relief of minor pain, you may take acetaminophen which is found in Tylenol,m Datril, Anacin-3 and Panadol. It is not blood thinner. The products listed below are.  Do not take any of the products listed below in addition to any listed on your instruction sheet.  A.P.C or A.P.C with Codeine Codeine Phosphate Capsules #3 Ibuprofen Ridaura  ABC compound Congesprin Imuran rimadil  Advil Cope Indocin Robaxisal  Alka-Seltzer Effervescent Pain Reliever and Antacid Coricidin or Coricidin-D  Indomethacin Rufen  Alka-Seltzer plus Cold Medicine Cosprin Ketoprofen S-A-C Tablets  Anacin Analgesic Tablets or Capsules Coumadin Korlgesic Salflex  Anacin Extra Strength Analgesic tablets or capsules CP-2 Tablets Lanoril Salicylate  Anaprox Cuprimine Capsules Levenox Salocol  Anexsia-D Dalteparin Magan Salsalate  Anodynos Darvon compound Magnesium Salicylate Sine-off  Ansaid Dasin Capsules Magsal Sodium Salicylate  Anturane Depen Capsules Marnal Soma  APF Arthritis pain formula Dewitt's Pills Measurin Stanback  Argesic Dia-Gesic Meclofenamic Sulfinpyrazone  Arthritis Bayer Timed Release Aspirin Diclofenac Meclomen Sulindac  Arthritis pain formula Anacin Dicumarol Medipren Supac  Analgesic (Safety coated) Arthralgen Diffunasal Mefanamic Suprofen  Arthritis Strength Bufferin Dihydrocodeine Mepro Compound Suprol  Arthropan liquid Dopirydamole Methcarbomol with Aspirin Synalgos  ASA tablets/Enseals Disalcid Micrainin Tagament  Ascriptin Doan's Midol Talwin  Ascriptin A/D Dolene Mobidin Tanderil  Ascriptin Extra Strength Dolobid  Moblgesic Ticlid  Ascriptin with Codeine Doloprin or Doloprin with Codeine Momentum Tolectin  Asperbuf Duoprin Mono-gesic Trendar  Aspergum Duradyne Motrin or Motrin IB Triminicin  Aspirin plain, buffered or enteric coated Durasal Myochrisine Trigesic  Aspirin Suppositories Easprin Nalfon Trillsate  Aspirin with Codeine Ecotrin Regular or Extra Strength Naprosyn Uracel  Atromid-S Efficin Naproxen Ursinus  Auranofin Capsules Elmiron Neocylate Vanquish  Axotal Emagrin Norgesic Verin  Azathioprine Empirin or Empirin with Codeine Normiflo Vitamin E  Azolid Emprazil Nuprin Voltaren  Bayer Aspirin plain, buffered or children's or timed BC Tablets or powders Encaprin Orgaran Warfarin Sodium  Buff-a-Comp Enoxaparin Orudis Zorpin  Buff-a-Comp with Codeine Equegesic Os-Cal-Gesic   Buffaprin Excedrin plain, buffered or Extra Strength Oxalid   Bufferin Arthritis Strength Feldene Oxphenbutazone   Bufferin plain or Extra Strength Feldene Capsules Oxycodone with Aspirin   Bufferin with Codeine Fenoprofen Fenoprofen Pabalate or Pabalate-SF   Buffets II Flogesic Panagesic   Buffinol plain or Extra Strength Florinal or Florinal with Codeine Panwarfarin   Buf-Tabs Flurbiprofen Penicillamine   Butalbital Compound Four-way cold tablets Penicillin   Butazolidin Fragmin Pepto-Bismol   Carbenicillin Geminisyn Percodan   Carna Arthritis Reliever Geopen Persantine   Carprofen Gold's salt Persistin   Chloramphenicol Goody's Phenylbutazone   Chloromycetin Haltrain Piroxlcam   Clmetidine heparin Plaquenil   Cllnoril Hyco-pap Ponstel   Clofibrate Hydroxy chloroquine Propoxyphen         Before stopping any of these medications, be sure to consult the physician who ordered them.  Some, such as Coumadin (Warfarin) are ordered to prevent or treat serious conditions such as "deep thrombosis", "pumonary embolisms", and other heart problems.  The amount of time  that you may need off of the medication may also vary with  the medication and the reason for which you were taking it.  If you are taking any of these medications, please make sure you notify your pain physician before you undergo any procedures.          ____________________________________________________________________________________________

## 2016-06-27 ENCOUNTER — Telehealth: Payer: Self-pay

## 2016-06-27 ENCOUNTER — Telehealth: Payer: Self-pay | Admitting: Pain Medicine

## 2016-06-27 NOTE — Telephone Encounter (Signed)
Could not get meds filled at [facility] in [place], needs PA for meds

## 2016-06-27 NOTE — Telephone Encounter (Signed)
Denies any needs at this time. "I have no pain. Instructed to call if needed.

## 2016-06-27 NOTE — Telephone Encounter (Signed)
PA for oxycodone/acetaminophen faxed 06-27-16

## 2016-07-04 ENCOUNTER — Encounter: Payer: Medicaid Other | Admitting: Pain Medicine

## 2016-07-04 NOTE — Progress Notes (Deleted)
Patient's Name: Tanya [name]  MRN: [id number]  Referring Provider: Ricardo [name]*  DOB: 04/27/1960  PCP: Tanya Jericho, NP  DOS: 07/04/2016  Note by: Kathlen Brunswick. Dossie Arbour, MD  Service setting: Ambulatory outpatient  Specialty: Interventional Pain Management  Location: [facility] (AMB) Pain Management Facility    Patient type: Established   Primary Reason(s) for Visit: Encounter for prescription drug management & post-procedure evaluation of chronic illness with mild to moderate exacerbation(Level of risk: moderate) CC: No chief complaint on file.  HPI  [name] is a 56 y.o. year old, female patient, who comes today for a post-procedure evaluation and medication management. She has Anxiety; Chronic upper back pain (Location of Secondary source of pain) (Bilateral) (R>L); Chronic discoid lupus erythematosus; Chronic low back pain (Location of Tertiary source of pain) (Bilateral) (R>L); Chronic neck pain (Location of Primary Source of Pain) (Bilateral) (R>L); Discoid lupus; Diverticulitis; Encounter for long-term (current) use of high-risk medication; Fibromyalgia; Generalized anxiety disorder; Hypercholesterolemia; Hyperlipidemia; Major depressive disorder, recurrent ([place]); Pain medication agreement signed; Osteoarthritis; SLE (systemic lupus erythematosus) ([place]); Urinary incontinence; Long term prescription benzodiazepine use; Long term current use of opiate analgesic; Long term prescription opiate use; Opiate use (10 MME/Day); Chronic pain syndrome; Chronic upper extremity pain (Right); Chronic cervical radicular pain (Right); Chronic shoulder pain (Bilateral) (R>L); 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 Her primarily concern today is the No chief complaint on file.  Pain  Assessment: Self-Reported Pain Score:  /10             Reported level is compatible with observation.          [name] was last seen on 06/27/2016 for a procedure. During today's appointment we reviewed [name]'s post-procedure results, as well as her outpatient medication regimen.  Further details on both, my assessment(s), as well as the proposed treatment plan, please see below.  Controlled Substance Pharmacotherapy Assessment REMS (Risk Evaluation and Mitigation Strategy)  Analgesic:Hydrocodone/APAP 5/325 one tablet by mouth twice a day (10 mg/dayof hydrocodone) MME/day:51m/day  No notes on file Pharmacokinetics: Liberation and absorption (onset of action): WNL Distribution (time to peak effect): WNL Metabolism and excretion (duration of action): WNL         Pharmacodynamics: Desired effects: Analgesia: Ms. SCoggeshallreports >50% benefit. Functional ability: Patient reports that medication allows her to accomplish basic ADLs Clinically meaningful improvement in function (CMIF): Sustained CMIF goals met Perceived effectiveness: Described as relatively effective, allowing for increase in activities of daily living (ADL) Undesirable effects: Side-effects or Adverse reactions: None reported Monitoring: [place] PMP: Online review of the past 155-montheriod conducted. Compliant with practice rules and regulations List of all UDS test(s) done:  Lab Results  Component Value Date   SUMMARY FINAL 11/23/2015   Last UDS on record: No results found for: TOXASSSELUR UDS interpretation: Compliant          Medication Assessment Form: Reviewed. Patient indicates being compliant with therapy Treatment compliance: Compliant Risk Assessment Profile: Aberrant behavior: See prior evaluations. None observed or detected today Comorbid factors increasing risk of overdose: See prior notes. No additional risks detected today Risk of substance use disorder (SUD): Low Opioid Risk Tool  (ORT) Total Score:    Interpretation Table:  Score <3 = Low Risk for SUD  Score between 4-7 = Moderate Risk for SUD  Score >8 = High Risk for Opioid  Abuse   Risk Mitigation Strategies:  Patient Counseling: Covered Patient-Prescriber Agreement (PPA): Present and active  Notification to other healthcare providers: Done  Pharmacologic Plan: No change in therapy, at this time  Post-Procedure Assessment  06/27/2016 Procedure: Left cervical facet radiofrequency ablation under fluoroscopic guidance and IV sedation Pre-procedure pain score:  5/10 Post-procedure pain score: 0/10 (100% relief) Influential Factors: BMI:   Intra-procedural challenges: None observed Assessment challenges: None detected         Post-procedural adverse reactions or complications: None reported Reported side-effects: None  Sedation: Sedation provided. When no sedatives are used, the analgesic levels obtained are directly associated to the effectiveness of the local anesthetics. However, when sedation is provided, the level of analgesia obtained during the initial 1 hour following the intervention, is believed to be the result of a combination of factors. These factors may include, but are not limited to: 1. The effectiveness of the local anesthetics used. 2. The effects of the analgesic(s) and/or anxiolytic(s) used. 3. The degree of discomfort experienced by the patient at the time of the procedure. 4. The patients ability and reliability in recalling and recording the events. 5. The presence and influence of possible secondary gains and/or psychosocial factors. Reported result: Relief experienced during the 1st hour after the procedure:   (Ultra-Short Term Relief) Interpretative annotation: Analgesia during this period is likely to be Local Anesthetic and/or IV Sedative (Analgesic/Anxiolitic) related.          Effects of local anesthetic: The analgesic effects attained during this period are directly associated to  the localized infiltration of local anesthetics and therefore cary significant diagnostic value as to the etiological location, or anatomical origin, of the pain. Expected duration of relief is directly dependent on the pharmacodynamics of the local anesthetic used. Long-acting (4-6 hours) anesthetics used.  Reported result: Relief during the next 4 to 6 hour after the procedure:   (Short-Term Relief) Interpretative annotation: Complete relief would suggest area to be the source of the pain.          Long-term benefit: Defined as the period of time past the expected duration of local anesthetics. With the possible exception of prolonged sympathetic blockade from the local anesthetics, benefits during this period are typically attributed to, or associated with, other factors such as analgesic sensory neuropraxia, antiinflammatory effects, or beneficial biochemical changes provided by agents other than the local anesthetics Reported result: Extended relief following procedure:   (Long-Term Relief) Interpretative annotation: Good relief. This could suggest inflammation to be a significant component in the etiology to the pain.          Current benefits: Defined as persistent relief that continues at this point in time.   Reported results: Treated area: *** %       Interpretative annotation: Ongoing benefits would suggest effective therapeutic approach  Interpretation: Results would suggest a successful diagnostic intervention.          Laboratory Chemistry  Inflammation Markers Lab Results  Component Value Date   CRP <0.8 11/23/2015   ESRSEDRATE 8 11/23/2015   (CRP: Acute Phase) (ESR: Chronic Phase) Renal Function Markers Lab Results  Component Value Date   BUN 10 11/23/2015   CREATININE 0.67 11/23/2015   GFRAA >60 11/23/2015   GFRNONAA >60 11/23/2015   Hepatic Function Markers Lab Results  Component Value Date   AST 23 11/23/2015   ALT 15 11/23/2015   ALBUMIN 4.6 11/23/2015    ALKPHOS 35 (L) 11/23/2015   Electrolytes Lab Results  Component  Value Date   NA 138 11/23/2015   K 4.0 11/23/2015   CL 102 11/23/2015   CALCIUM 9.5 11/23/2015   MG 2.1 11/23/2015   Neuropathy Markers Lab Results  Component Value Date   VITAMINB12 318 11/23/2015   Bone Pathology Markers Lab Results  Component Value Date   ALKPHOS 35 (L) 11/23/2015   25OHVITD1 28 (L) 11/23/2015   25OHVITD2 <1.0 11/23/2015   25OHVITD3 28 11/23/2015   CALCIUM 9.5 11/23/2015   Coagulation Parameters No results found for: INR, LABPROT, APTT, PLT Cardiovascular Markers No results found for: BNP, HGB, HCT Note: Lab results reviewed.  Recent Diagnostic Imaging Review  Dg C-arm 1-60 Min-no Report  Result Date: 06/26/2016 Fluoroscopy was utilized by the requesting physician.  No radiographic interpretation.   Note: Imaging results reviewed.          Meds  The patient has a current medication list which includes the following prescription(s): vitamin d3, cyclobenzaprine, duloxetine, gabapentin, hydrocodone-acetaminophen, hydrocodone-acetaminophen, hydrocodone-acetaminophen, hydrocodone-acetaminophen, meloxicam, and oxycodone-acetaminophen.  Current Outpatient Prescriptions on File Prior to Visit  Medication Sig  . Cholecalciferol (VITAMIN D3) 2000 units capsule Take 1 capsule (2,000 Units total) by mouth daily.  . cyclobenzaprine (FLEXERIL) 10 MG tablet Take 1 tablet (10 mg total) by mouth 3 (three) times daily as needed for muscle spasms.  . DULoxetine (CYMBALTA) 30 MG capsule TAKE 1 CAPSULE(30 MG) BY MOUTH EVERY DAY  . gabapentin (NEURONTIN) 300 MG capsule Take 1 capsule (300 mg total) by mouth 3 (three) times daily.  Marland Kitchen HYDROcodone-acetaminophen (NORCO/VICODIN) 5-325 MG tablet Take 1 tablet by mouth 2 (two) times daily.  Marland Kitchen HYDROcodone-acetaminophen (NORCO/VICODIN) 5-325 MG tablet Take 1 tablet by mouth 2 (two) times daily as needed for moderate pain.  Marland Kitchen HYDROcodone-acetaminophen (NORCO/VICODIN)  5-325 MG tablet Take 1 tablet by mouth 2 (two) times daily as needed for moderate pain.  Marland Kitchen HYDROcodone-acetaminophen (NORCO/VICODIN) 5-325 MG tablet Take by mouth.  . meloxicam (MOBIC) 15 MG tablet Take 1 tablet (15 mg total) by mouth daily.  Marland Kitchen oxyCODONE-acetaminophen (PERCOCET) 5-325 MG tablet Take 1 tablet by mouth every 8 (eight) hours as needed for severe pain.   No current facility-administered medications on file prior to visit.    ROS  Constitutional: Denies any fever or chills Gastrointestinal: No reported hemesis, hematochezia, vomiting, or acute GI distress Musculoskeletal: Denies any acute onset joint swelling, redness, loss of ROM, or weakness Neurological: No reported episodes of acute onset apraxia, aphasia, dysarthria, agnosia, amnesia, paralysis, loss of coordination, or loss of consciousness  Allergies  Ms. Burleigh is allergic to trazodone; bupropion; methocarbamol; oxycodone-acetaminophen; tramadol; and trazodone and nefazodone.  [place]  Drug: Ms. Wrightson  reports that she does not use drugs. Alcohol:  reports that she does not drink alcohol. Tobacco:  reports that she has been smoking Cigarettes.  She has been smoking about 0.50 packs per day. She has never used smokeless tobacco. Medical:  has a past medical history of Anxiety; Depression; Hyperlipidemia; Lupus; and Overactive bladder. Family: family history includes Cancer in her mother; Diabetes in her brother; Emphysema in her mother; Glaucoma in her father; Heart disease in her father; Hypertension in her mother; Stroke in her mother.  Past Surgical History:  Procedure Laterality Date  . ABDOMINAL HYSTERECTOMY     Constitutional Exam  General appearance: Well nourished, well developed, and well hydrated. In no apparent acute distress There were no vitals filed for this visit. BMI Assessment: Estimated body mass index is 26.57 kg/m as calculated from the following:  Height as of 06/26/16: _0  (1.6 m).    Weight as of 06/26/16: 150 lb (68 kg).  BMI interpretation table: BMI level Category Range association with higher incidence of chronic pain  <18 kg/m2 Underweight   18.5-24.9 kg/m2 Ideal body weight   25-29.9 kg/m2 Overweight Increased incidence by 20%  30-34.9 kg/m2 Obese (Class I) Increased incidence by 68%  35-39.9 kg/m2 Severe obesity (Class II) Increased incidence by 136%  >40 kg/m2 Extreme obesity (Class III) Increased incidence by 254%   BMI Readings from Last 4 Encounters:  06/26/16 26.57 kg/m  05/22/16 26.75 kg/m  04/24/16 26.75 kg/m  04/15/16 27.10 kg/m   Wt Readings from Last 4 Encounters:  06/26/16 150 lb (68 kg)  05/22/16 151 lb (68.5 kg)  04/24/16 151 lb (68.5 kg)  04/15/16 153 lb (69.4 kg)  Psych/Mental status: Alert, oriented x 3 (person, place, & time)       Eyes: PERLA Respiratory: No evidence of acute respiratory distress  Cervical Spine Exam  Inspection: No masses, redness, or swelling Alignment: Symmetrical Functional ROM: Unrestricted ROM      Stability: No instability detected Muscle strength & Tone: Functionally intact Sensory: Unimpaired Palpation: No palpable anomalies              Upper Extremity (UE) Exam    Side: Right upper extremity  Side: Left upper extremity  Inspection: No masses, redness, swelling, or asymmetry. No contractures  Inspection: No masses, redness, swelling, or asymmetry. No contractures  Functional ROM: Unrestricted ROM          Functional ROM: Unrestricted ROM          Muscle strength & Tone: Functionally intact  Muscle strength & Tone: Functionally intact  Sensory: Unimpaired  Sensory: Unimpaired  Palpation: No palpable anomalies              Palpation: No palpable anomalies              Specialized Test(s): Deferred         Specialized Test(s): Deferred          Thoracic Spine Exam  Inspection: No masses, redness, or swelling Alignment: Symmetrical Functional ROM: Unrestricted ROM Stability: No instability  detected Sensory: Unimpaired Muscle strength & Tone: No palpable anomalies  Lumbar Spine Exam  Inspection: No masses, redness, or swelling Alignment: Symmetrical Functional ROM: Unrestricted ROM      Stability: No instability detected Muscle strength & Tone: Functionally intact Sensory: Unimpaired Palpation: No palpable anomalies       Provocative Tests: Lumbar Hyperextension and rotation test: evaluation deferred today       Patrick's Maneuver: evaluation deferred today                    Gait & Posture Assessment  Ambulation: Unassisted Gait: Relatively normal for age and body habitus Posture: WNL   Lower Extremity Exam    Side: Right lower extremity  Side: Left lower extremity  Inspection: No masses, redness, swelling, or asymmetry. No contractures  Inspection: No masses, redness, swelling, or asymmetry. No contractures  Functional ROM: Unrestricted ROM          Functional ROM: Unrestricted ROM          Muscle strength & Tone: Functionally intact  Muscle strength & Tone: Functionally intact  Sensory: Unimpaired  Sensory: Unimpaired  Palpation: No palpable anomalies  Palpation: No palpable anomalies   Assessment  Primary Diagnosis & Pertinent Problem List: There were no encounter diagnoses.  Status  Diagnosis  Controlled Controlled Controlled No diagnosis found.  Problems updated and reviewed during this visit: No problems updated. Plan of Care  Pharmacotherapy (Medications Ordered): No orders of the defined types were placed in this encounter.  New Prescriptions   No medications on file   Medications administered today: Ms. Beal had no medications administered during this visit. Lab-work, procedure(s), and/or referral(s): No orders of the defined types were placed in this encounter.  Imaging and/or referral(s): None  Interventional therapies: Planned, scheduled, and/or pending:   Not at this time.   Considering:   ***   Palliative PRN treatment(s):    ***   Provider-requested follow-up: No Follow-up on file.  Future Appointments Date Time Provider [place]  09/11/2016 10:15 AM Milinda Pointer, MD [facility] None   Primary Care Physician: Tanya Jericho, NP Location: [facility] Note by: Kathlen Brunswick Dossie Arbour, M.D, DABA, DABAPM, DABPM, DABIPP, FIPP Date: 07/04/2016; Time: 1:33 PM  Patient instructions provided during this appointment: There are no Patient Instructions on file for this visit.

## 2016-07-11 ENCOUNTER — Encounter: Payer: Self-pay | Admitting: Pain Medicine

## 2016-07-11 ENCOUNTER — Other Ambulatory Visit: Payer: Self-pay | Admitting: Pain Medicine

## 2016-07-11 ENCOUNTER — Ambulatory Visit: Payer: Medicaid Other | Attending: Pain Medicine | Admitting: Pain Medicine

## 2016-07-11 VITALS — BP 114/73 | HR 93 | Temp 98.0°F | Resp 16 | Ht 63.0 in | Wt 150.0 lb

## 2016-07-11 DIAGNOSIS — E7800 Pure hypercholesterolemia, unspecified: Secondary | ICD-10-CM | POA: Insufficient documentation

## 2016-07-11 DIAGNOSIS — M47812 Spondylosis without myelopathy or radiculopathy, cervical region: Secondary | ICD-10-CM

## 2016-07-11 DIAGNOSIS — M791 Myalgia: Secondary | ICD-10-CM | POA: Diagnosis not present

## 2016-07-11 DIAGNOSIS — M7918 Myalgia, other site: Secondary | ICD-10-CM

## 2016-07-11 DIAGNOSIS — M79601 Pain in right arm: Secondary | ICD-10-CM | POA: Diagnosis not present

## 2016-07-11 DIAGNOSIS — F329 Major depressive disorder, single episode, unspecified: Secondary | ICD-10-CM | POA: Insufficient documentation

## 2016-07-11 DIAGNOSIS — G894 Chronic pain syndrome: Secondary | ICD-10-CM | POA: Diagnosis present

## 2016-07-11 DIAGNOSIS — F1190 Opioid use, unspecified, uncomplicated: Secondary | ICD-10-CM

## 2016-07-11 DIAGNOSIS — M5489 Other dorsalgia: Secondary | ICD-10-CM | POA: Diagnosis not present

## 2016-07-11 DIAGNOSIS — G8929 Other chronic pain: Secondary | ICD-10-CM

## 2016-07-11 DIAGNOSIS — F419 Anxiety disorder, unspecified: Secondary | ICD-10-CM | POA: Diagnosis not present

## 2016-07-11 DIAGNOSIS — L930 Discoid lupus erythematosus: Secondary | ICD-10-CM | POA: Insufficient documentation

## 2016-07-11 DIAGNOSIS — Z79891 Long term (current) use of opiate analgesic: Secondary | ICD-10-CM

## 2016-07-11 DIAGNOSIS — F411 Generalized anxiety disorder: Secondary | ICD-10-CM | POA: Diagnosis not present

## 2016-07-11 DIAGNOSIS — F17210 Nicotine dependence, cigarettes, uncomplicated: Secondary | ICD-10-CM | POA: Insufficient documentation

## 2016-07-11 DIAGNOSIS — Z79899 Other long term (current) drug therapy: Secondary | ICD-10-CM | POA: Insufficient documentation

## 2016-07-11 DIAGNOSIS — M545 Low back pain: Secondary | ICD-10-CM | POA: Diagnosis not present

## 2016-07-11 DIAGNOSIS — M797 Fibromyalgia: Secondary | ICD-10-CM

## 2016-07-11 DIAGNOSIS — M549 Dorsalgia, unspecified: Secondary | ICD-10-CM | POA: Diagnosis not present

## 2016-07-11 DIAGNOSIS — K5792 Diverticulitis of intestine, part unspecified, without perforation or abscess without bleeding: Secondary | ICD-10-CM | POA: Diagnosis not present

## 2016-07-11 DIAGNOSIS — M4692 Unspecified inflammatory spondylopathy, cervical region: Secondary | ICD-10-CM

## 2016-07-11 DIAGNOSIS — R32 Unspecified urinary incontinence: Secondary | ICD-10-CM | POA: Diagnosis not present

## 2016-07-11 DIAGNOSIS — Z90710 Acquired absence of both cervix and uterus: Secondary | ICD-10-CM | POA: Insufficient documentation

## 2016-07-11 DIAGNOSIS — M542 Cervicalgia: Secondary | ICD-10-CM | POA: Insufficient documentation

## 2016-07-11 MED ORDER — GABAPENTIN 300 MG PO CAPS: 300.0000 mg | ORAL_CAPSULE | Freq: Three times a day (TID) | ORAL | 0 refills | Status: DC

## 2016-07-11 MED ORDER — CYCLOBENZAPRINE HCL 10 MG PO TABS: 10.0000 mg | ORAL_TABLET | Freq: Three times a day (TID) | ORAL | 2 refills | Status: DC | PRN

## 2016-07-11 MED ORDER — OXYCODONE HCL 5 MG PO TABS: 5.0000 mg | ORAL_TABLET | Freq: Two times a day (BID) | ORAL | 0 refills | Status: DC

## 2016-07-11 MED ORDER — OXYCODONE HCL 5 MG PO TABS
5.0000 mg | ORAL_TABLET | Freq: Two times a day (BID) | ORAL | 0 refills | Status: DC
Start: 2016-09-12 — End: 2016-10-08

## 2016-07-11 NOTE — Progress Notes (Signed)
Patient's Name: Tanya [name]  MRN: [id number]  Referring Provider: Ricardo [name]*  DOB: January 24, 1960  PCP: Tanya Jericho, NP  DOS: 07/11/2016  Note by: Tanya [name]. Tanya Arbour, MD  Service setting: Ambulatory outpatient  Specialty: Interventional Pain Management  Location: [facility] (AMB) Pain Management Facility    Patient type: Established   Primary Reason(s) for Visit: Encounter for prescription drug management & post-procedure evaluation of chronic illness with mild to moderate exacerbation(Level of risk: moderate) CC: Neck Pain  HPI  Tanya [name] is a 56 y.o. year old, female patient, who comes today for a post-procedure evaluation and medication management. She has Anxiety; Chronic upper back pain (Location of Secondary source of pain) (Bilateral) (R>L); Chronic discoid lupus erythematosus; Chronic low back pain (Location of Tertiary source of pain) (Bilateral) (R>L); Chronic neck pain (Location of Primary Source of Pain) (Bilateral) (R>L); Discoid lupus; Diverticulitis; Encounter for long-term (current) use of high-risk medication; Fibromyalgia; Generalized anxiety disorder; Hypercholesterolemia; Hyperlipidemia; Major depressive disorder, recurrent ([place]); Pain medication agreement signed; Osteoarthritis; SLE (systemic lupus erythematosus) ([place]); Urinary incontinence; Long term prescription benzodiazepine use; Long term current use of opiate analgesic; Long term prescription opiate use; Opiate use (10 MME/Day); Chronic pain syndrome; Chronic upper extremity pain (Right); Chronic cervical radicular pain (Right); Chronic shoulder pain (Bilateral) (R>L); 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 Her primarily concern today is the Neck Pain  Pain Assessment: Self-Reported Pain  Score: 1 /10             Reported level is compatible with observation.       Pain Location: Neck Pain Orientation: Right Pain Descriptors / Indicators: Dull Pain Frequency: Constant  Tanya [name] was last seen on 07/04/2016 for a procedure. During today's appointment we reviewed Tanya [name]'s post-procedure results, as well as her outpatient medication regimen.  Further details on both, my assessment(s), as well as the proposed treatment plan, please see below.  Controlled Substance Pharmacotherapy Assessment REMS (Risk Evaluation and Mitigation Strategy)  Analgesic: Oxycodone IR 5 mg 1 tablet by mouth twice a day (10 mg/day of oxycodone) (15 MME/Day). The patient was previously taking hydrocodone 5/325 one tablet by mouth twice a day but we will be switching her today to the oxycodone to eliminate the APAP. MME/day: 15 mg/day.  Tanya Specking, RN  07/11/2016  1:22 PM  Sign at close encounter Nursing Pain Medication Assessment:  Safety precautions to be maintained throughout the outpatient stay will include: orient to surroundings, keep bed in low position, maintain call bell within reach at all times, provide assistance with transfer out of bed and ambulation.  Medication Inspection Compliance: Pill count conducted under aseptic conditions, in front of the patient. Neither the pills nor the bottle was removed from the patient's sight at any time. Once count was completed pills were immediately returned to the patient in their original bottle.  Medication #1: Hydrocodone/APAP Pill/Patch Count: 0 of 60 pills remain Pill/Patch Appearance: Markings consistent with prescribed medication Bottle Appearance: Standard pharmacy container. Clearly labeled. Filled Date: 05 / 27 / 2018 Last Medication intake:  Ran out of medicine more than 48 hours ago  Medication #2: Oxycodone/APAP Pill/Patch Count: 0 of 30 pills remain Pill/Patch Appearance: Markings consistent with prescribed  medication Bottle Appearance: Standard pharmacy container. Clearly labeled. Filled Date: 06 / 06 / 2018 Last Medication intake:  Today   Pharmacokinetics: Liberation  and absorption (onset of action): WNL Distribution (time to peak effect): WNL Metabolism and excretion (duration of action): WNL         Pharmacodynamics: Desired effects: Analgesia: Tanya [name] reports >50% benefit. Functional ability: Patient reports that medication allows her to accomplish basic ADLs Clinically meaningful improvement in function (CMIF): Sustained CMIF goals met Perceived effectiveness: Described as relatively effective, allowing for increase in activities of daily living (ADL) Undesirable effects: Side-effects or Adverse reactions: None reported Monitoring: [place] PMP: Online review of the past 67-monthperiod conducted. Compliant with practice rules and regulations List of all UDS test(s) done:  Lab Results  Component Value Date   SUMMARY FINAL 11/23/2015   Last UDS on record: Summary  Date Value Ref Range Status  11/23/2015 FINAL  Final    Comment:    ==================================================================== TOXASSURE COMP DRUG ANALYSIS,UR ==================================================================== Test                             Result       Flag       Units Drug Present and Declared for Prescription Verification   Gabapentin                     PRESENT      EXPECTED Drug Absent but Declared for Prescription Verification   Cyclobenzaprine                Not Detected UNEXPECTED   Duloxetine                     Not Detected UNEXPECTED ==================================================================== Test                      Result    Flag   Units      Ref Range   Creatinine              22               mg/dL      >=20 ==================================================================== Declared Medications:  The flagging and interpretation on this report are based  on the  following declared medications.  Unexpected results may arise from  inaccuracies in the declared medications.  **Note: The testing scope of this panel includes these medications:  Cyclobenzaprine (Flexeril)  Duloxetine (Cymbalta)  Gabapentin (Neurontin)  **Note: The testing scope of this panel does not include following  reported medications:  Atorvastatin (Lipitor)  Docusate (Colace)  Meloxicam (Mobic) ==================================================================== For clinical consultation, please call ([phone] ====================================================================    UDS interpretation: Compliant          Medication Assessment Form: Reviewed. Patient indicates being compliant with therapy Treatment compliance: Compliant Risk Assessment Profile: Aberrant behavior: See prior evaluations. None observed or detected today Comorbid factors increasing risk of overdose: See prior notes. No additional risks detected today Risk of substance use disorder (SUD): Low Opioid Risk Tool (ORT) Total Score: 3  Interpretation Table:  Score <3 = Low Risk for SUD  Score between 4-7 = Moderate Risk for SUD  Score >8 = High Risk for Opioid Abuse   Risk Mitigation Strategies:  Patient Counseling: Covered Patient-Prescriber Agreement (PPA): Present and active  Notification to other healthcare providers: Done  Pharmacologic Plan: No change in therapy, at this time  Post-Procedure Assessment  07/04/2016 Procedure: Therapeutic left sided cervical facet RFA under fluoroscopic guidance and IV sedation Pre-procedure pain score:  5/10 Post-procedure pain score: 0/10 (  100% relief) Influential Factors: BMI: 26.57 kg/m Intra-procedural challenges: None observed Assessment challenges: None detected         Post-procedural adverse reactions or complications: None reported Reported side-effects: None  Sedation: Sedation provided. When no sedatives are used, the  analgesic levels obtained are directly associated to the effectiveness of the local anesthetics. However, when sedation is provided, the level of analgesia obtained during the initial 1 hour following the intervention, is believed to be the result of a combination of factors. These factors may include, but are not limited to: 1. The effectiveness of the local anesthetics used. 2. The effects of the analgesic(s) and/or anxiolytic(s) used. 3. The degree of discomfort experienced by the patient at the time of the procedure. 4. The patients ability and reliability in recalling and recording the events. 5. The presence and influence of possible secondary gains and/or psychosocial factors. Reported result: Relief experienced during the 1st hour after the procedure: 100 % (Ultra-Short Term Relief) Interpretative annotation: Analgesia during this period is likely to be Local Anesthetic and/or IV Sedative (Analgesic/Anxiolitic) related.          Effects of local anesthetic: The analgesic effects attained during this period are directly associated to the localized infiltration of local anesthetics and therefore cary significant diagnostic value as to the etiological location, or anatomical origin, of the pain. Expected duration of relief is directly dependent on the pharmacodynamics of the local anesthetic used. Long-acting (4-6 hours) anesthetics used.  Reported result: Relief during the next 4 to 6 hour after the procedure: 100 % (Short-Term Relief) Interpretative annotation: Complete relief would suggest area to be the source of the pain.          Long-term benefit: Defined as the period of time past the expected duration of local anesthetics. With the possible exception of prolonged sympathetic blockade from the local anesthetics, benefits during this period are typically attributed to, or associated with, other factors such as analgesic sensory neuropraxia, antiinflammatory effects, or beneficial biochemical  changes provided by agents other than the local anesthetics Reported result: Extended relief following procedure: 75 % (Long-Term Relief) Interpretative annotation: Good relief. This could suggest inflammation to be a significant component in the etiology to the pain.          Current benefits: Defined as persistent relief that continues at this point in time.   Reported results: Treated area: 100 % relief of the pain on the treated left side. She continues to have pain on the right side and she would like for [place] to proceed with a radiofrequency on that side. Ms. Friberg reports improvement in function Interpretative annotation: Long-term benefit would suggest adequate RF ablation  Interpretation: Results would suggest a successful therapeutic intervention. Today we will schedule the patient to return for a right sided Cervical facet RFA  Laboratory Chemistry  Inflammation Markers Lab Results  Component Value Date   CRP 6.0 (H) 07/11/2016   ESRSEDRATE 7 07/11/2016   (CRP: Acute Phase) (ESR: Chronic Phase) Renal Function Markers Lab Results  Component Value Date   BUN 11 07/11/2016   CREATININE 0.70 07/11/2016   GFRAA 114 07/11/2016   GFRNONAA 99 07/11/2016   Hepatic Function Markers Lab Results  Component Value Date   AST 14 07/11/2016   ALT 10 07/11/2016   ALBUMIN 4.4 07/11/2016   ALKPHOS 35 (L) 07/11/2016   Electrolytes Lab Results  Component Value Date   NA 142 07/11/2016   K 4.6 07/11/2016   CL 103 07/11/2016  CALCIUM 9.7 07/11/2016   MG 2.2 07/11/2016   Neuropathy Markers Lab Results  Component Value Date   VITAMINB12 310 07/11/2016   Bone Pathology Markers Lab Results  Component Value Date   ALKPHOS 35 (L) 07/11/2016   25OHVITD1 WILL FOLLOW 07/11/2016   25OHVITD2 WILL FOLLOW 07/11/2016   25OHVITD3 WILL FOLLOW 07/11/2016   CALCIUM 9.7 07/11/2016   Coagulation Parameters No results found for: INR, LABPROT, APTT, PLT Cardiovascular Markers No results  found for: BNP, HGB, HCT Note: Lab results reviewed.  Recent Diagnostic Imaging Review  Dg C-arm 1-60 Min-no Report  Result Date: 06/26/2016 Fluoroscopy was utilized by the requesting physician.  No radiographic interpretation.   Note: Imaging results reviewed.          Meds  The patient has a current medication list which includes the following prescription(s): vitamin d3, cyclobenzaprine, duloxetine, gabapentin, oxycodone, oxycodone, and oxycodone.  Current Outpatient Prescriptions on File Prior to Visit  Medication Sig  . Cholecalciferol (VITAMIN D3) 2000 units capsule Take 1 capsule (2,000 Units total) by mouth daily.  . DULoxetine (CYMBALTA) 30 MG capsule TAKE 1 CAPSULE(30 MG) BY MOUTH EVERY DAY   No current facility-administered medications on file prior to visit.    ROS  Constitutional: Denies any fever or chills Gastrointestinal: No reported hemesis, hematochezia, vomiting, or acute GI distress Musculoskeletal: Denies any acute onset joint swelling, redness, loss of ROM, or weakness Neurological: No reported episodes of acute onset apraxia, aphasia, dysarthria, agnosia, amnesia, paralysis, loss of coordination, or loss of consciousness  Allergies  Ms. Haralson is allergic to trazodone; bupropion; methocarbamol; oxycodone-acetaminophen; tramadol; and trazodone and nefazodone.  [place]  Drug: Ms. Bowerman  reports that she does not use drugs. Alcohol:  reports that she does not drink alcohol. Tobacco:  reports that she has been smoking Cigarettes.  She has been smoking about 0.50 packs per day. She has never used smokeless tobacco. Medical:  has a past medical history of Anxiety; Depression; Hyperlipidemia; Lupus; and Overactive bladder. Family: family history includes Cancer in her mother; Diabetes in her brother; Emphysema in her mother; Glaucoma in her father; Heart disease in her father; Hypertension in her mother; Stroke in her mother.  Past Surgical History:   Procedure Laterality Date  . ABDOMINAL HYSTERECTOMY     Constitutional Exam  General appearance: Well nourished, well developed, and well hydrated. In no apparent acute distress Vitals:   07/11/16 1306  BP: 114/73  Pulse: 93  Resp: 16  Temp: 98 F (36.7 C)  TempSrc: Oral  SpO2: 99%  Weight: 150 lb (68 kg)  Height: 5' 3"  (1.6 m)   BMI Assessment: Estimated body mass index is 26.57 kg/m as calculated from the following:   Height as of this encounter: 5' 3"  (1.6 m).   Weight as of this encounter: 150 lb (68 kg).  BMI interpretation table: BMI level Category Range association with higher incidence of chronic pain  <18 kg/m2 Underweight   18.5-24.9 kg/m2 Ideal body weight   25-29.9 kg/m2 Overweight Increased incidence by 20%  30-34.9 kg/m2 Obese (Class I) Increased incidence by 68%  35-39.9 kg/m2 Severe obesity (Class II) Increased incidence by 136%  >40 kg/m2 Extreme obesity (Class III) Increased incidence by 254%   BMI Readings from Last 4 Encounters:  07/11/16 26.57 kg/m  06/26/16 26.57 kg/m  05/22/16 26.75 kg/m  04/24/16 26.75 kg/m   Wt Readings from Last 4 Encounters:  07/11/16 150 lb (68 kg)  06/26/16 150 lb (68 kg)  05/22/16 151 lb (  68.5 kg)  04/24/16 151 lb (68.5 kg)  Psych/Mental status: Alert, oriented x 3 (person, place, & time)       Eyes: PERLA Respiratory: No evidence of acute respiratory distress  Cervical Spine Exam  Inspection: No masses, redness, or swelling Alignment: Symmetrical Functional ROM: Improved after treatment      Stability: No instability detected Muscle strength & Tone: Functionally intact Sensory: Movement-associated pain Palpation: No palpable anomalies              Upper Extremity (UE) Exam    Side: Right upper extremity  Side: Left upper extremity  Inspection: No masses, redness, swelling, or asymmetry. No contractures  Inspection: No masses, redness, swelling, or asymmetry. No contractures  Functional ROM: Unrestricted  ROM          Functional ROM: Unrestricted ROM          Muscle strength & Tone: Functionally intact  Muscle strength & Tone: Functionally intact  Sensory: Unimpaired  Sensory: Unimpaired  Palpation: No palpable anomalies              Palpation: No palpable anomalies              Specialized Test(s): Deferred         Specialized Test(s): Deferred          Thoracic Spine Exam  Inspection: No masses, redness, or swelling Alignment: Symmetrical Functional ROM: Unrestricted ROM Stability: No instability detected Sensory: Unimpaired Muscle strength & Tone: No palpable anomalies  Lumbar Spine Exam  Inspection: No masses, redness, or swelling Alignment: Symmetrical Functional ROM: Unrestricted ROM      Stability: No instability detected Muscle strength & Tone: Functionally intact Sensory: Unimpaired Palpation: No palpable anomalies       Provocative Tests: Lumbar Hyperextension and rotation test: evaluation deferred today       Patrick's Maneuver: evaluation deferred today                    Gait & Posture Assessment  Ambulation: Unassisted Gait: Relatively normal for age and body habitus Posture: WNL   Lower Extremity Exam    Side: Right lower extremity  Side: Left lower extremity  Inspection: No masses, redness, swelling, or asymmetry. No contractures  Inspection: No masses, redness, swelling, or asymmetry. No contractures  Functional ROM: Unrestricted ROM          Functional ROM: Unrestricted ROM          Muscle strength & Tone: Functionally intact  Muscle strength & Tone: Functionally intact  Sensory: Unimpaired  Sensory: Unimpaired  Palpation: No palpable anomalies  Palpation: No palpable anomalies   Assessment  Primary Diagnosis & Pertinent Problem List: The primary encounter diagnosis was Chronic neck pain (Location of Primary Source of Pain) (Bilateral) (R>L). Diagnoses of Chronic upper back pain (Location of Secondary source of pain) (Bilateral) (R>L), Chronic low back pain  (Location of Tertiary source of pain) (Bilateral) (R>L), Cervical facet syndrome (Bilateral) (R>L), Fibromyalgia, Musculoskeletal pain, Chronic pain syndrome, Long term prescription opiate use, and Opiate use (10 MME/Day) were also pertinent to this visit.  Status Diagnosis  Improving Improving Controlled 1. Chronic neck pain (Location of Primary Source of Pain) (Bilateral) (R>L)   2. Chronic upper back pain (Location of Secondary source of pain) (Bilateral) (R>L)   3. Chronic low back pain (Location of Tertiary source of pain) (Bilateral) (R>L)   4. Cervical facet syndrome (Bilateral) (R>L)   5. Fibromyalgia   6. Musculoskeletal pain   7.  Chronic pain syndrome   8. Long term prescription opiate use   9. Opiate use (10 MME/Day)     Problems updated and reviewed during this visit: No problems updated. Plan of Care  Pharmacotherapy (Medications Ordered): Meds ordered this encounter  Medications  . oxyCODONE (OXY IR/ROXICODONE) 5 MG immediate release tablet    Sig: Take 1 tablet (5 mg total) by mouth 2 (two) times daily.    Dispense:  60 tablet    Refill:  0    Do not place this medication, or any other prescription from our practice, on "Automatic Refill". Patient may have prescription filled one day early if pharmacy is closed on scheduled refill date. Do not fill until: 07/14/16 To last until: 08/13/16  . oxyCODONE (OXY IR/ROXICODONE) 5 MG immediate release tablet    Sig: Take 1 tablet (5 mg total) by mouth 2 (two) times daily.    Dispense:  60 tablet    Refill:  0    Do not place this medication, or any other prescription from our practice, on "Automatic Refill". Patient may have prescription filled one day early if pharmacy is closed on scheduled refill date. Do not fill until: 08/13/16 To last until: 09/12/16  . oxyCODONE (OXY IR/ROXICODONE) 5 MG immediate release tablet    Sig: Take 1 tablet (5 mg total) by mouth 2 (two) times daily.    Dispense:  60 tablet    Refill:  0     Do not place this medication, or any other prescription from our practice, on "Automatic Refill". Patient may have prescription filled one day early if pharmacy is closed on scheduled refill date. Do not fill until: 09/12/16 To last until: 10/12/16  . gabapentin (NEURONTIN) 300 MG capsule    Sig: Take 1 capsule (300 mg total) by mouth 3 (three) times daily.    Dispense:  270 capsule    Refill:  0    Do not add this medication to the electronic "Automatic Refill" notification system. Patient may have prescription filled one day early if pharmacy is closed on scheduled refill date.  . cyclobenzaprine (FLEXERIL) 10 MG tablet    Sig: Take 1 tablet (10 mg total) by mouth 3 (three) times daily as needed for muscle spasms.    Dispense:  90 tablet    Refill:  2    Do not add this medication to the electronic "Automatic Refill" notification system. Patient may have prescription filled one day early if pharmacy is closed on scheduled refill date.   New Prescriptions   OXYCODONE (OXY IR/ROXICODONE) 5 MG IMMEDIATE RELEASE TABLET    Take 1 tablet (5 mg total) by mouth 2 (two) times daily.   OXYCODONE (OXY IR/ROXICODONE) 5 MG IMMEDIATE RELEASE TABLET    Take 1 tablet (5 mg total) by mouth 2 (two) times daily.   OXYCODONE (OXY IR/ROXICODONE) 5 MG IMMEDIATE RELEASE TABLET    Take 1 tablet (5 mg total) by mouth 2 (two) times daily.   Medications administered today: Ms. Aerts had no medications administered during this visit. Lab-work, procedure(s), and/or referral(s): Orders Placed This Encounter  Procedures  . Radiofrequency,Cervical  . ToxASSURE Select 13 (MW), Urine  . Comprehensive metabolic panel  . C-reactive protein  . Sedimentation rate  . Magnesium  . 25-Hydroxyvitamin D Lcms D2+D3  . Vitamin B12   Imaging and/or referral(s): None  Interventional therapies: Planned, scheduled, and/or pending:   Therapeutic right-sided cervical facet RFA under fluoroscopic guidance and IV sedation  Considering:   Diagnostic bilateral intra-articular shoulder joint injection Diagnostic bilateral suprascapular nerve block Possible bilateral suprascapular nerve RFA Right-sided cervical epidural steroid injection(Series #2) Diagnostic bilateral cervical facet block Possible bilateral cervical facet radiofrequencyablation  Diagnostic bilateral lumbar facet block Possible bilateral lumbar facet radiofrequency ablation    Palliative PRN treatment(s):   Right-sided cervical epidural steroid injection(Series #2)   Provider-requested follow-up: Return for RFA, procedure (w/ sedation):, (ASAA), by MD, in addition, Med-Mgmt, (3 mo), by NP.  Future Appointments Date Time Provider [place]  09/11/2016 10:15 AM Milinda Pointer, MD [facility] None  10/08/2016 1:30 PM Vevelyn Francois, NP [facility] None   Primary Care Physician: Tanya Jericho, NP Location: [facility] Note by: Tanya [name] Tanya [name], M.D, DABA, DABAPM, DABPM, DABIPP, FIPP Date: 07/11/2016; Time: 2:03 PM  Patient instructions provided during this appointment: Patient Instructions    ____________________________________________________________________________________________  Appointment Policy Summary  It is our goal and responsibility to provide the medical community with assistance in the evaluation and management of patients with chronic pain. Unfortunately our resources are limited. Because we do not have an unlimited amount of time, or available appointments, we are required to closely monitor and manage their use. The following rules exist to maximize their use:  Patient's responsibilities: 1. Punctuality: You are required to be physically present and registered in our facility at least 30 minutes before your appointment. 2. Tardiness: The cutoff is your appointment time. If you have an appointment scheduled for 10:00 AM and you arrive at 10:01, you will be  required to reschedule your appointment.  3. Plan ahead: Always assume that you will encounter traffic on your way in. Plan for it. If you are dependent on a driver, make sure they understand these rules and the need to arrive early. 4. Other appointments and responsibilities: Avoid scheduling any other appointments before or after your pain clinic appointments.  5. Be prepared: Write down everything that you need to discuss with your healthcare provider and give this information to the admitting nurse. Write down the medications that you will need refilled. Bring your pills and bottles (even the empty ones), to all of your appointments, except for those where a procedure is scheduled. 6. No children or pets: Find someone to take care of them. It is not appropriate to bring them in. 7. Scheduling changes: We request "advanced notification" of any changes or cancellations. 8. Advanced notification: Defined as a time period of more than 24 hours prior to the originally scheduled appointment. This allows for the appointment to be offered to other patients. 9. Rescheduling: When a visit is rescheduled, it will require the cancellation of the original appointment. For this reason they both fall within the category of "Cancellations".  10. Cancellations: They require advanced notification. Any cancellation less than 24 hours before the  appointment will be recorded as a "No Show". 11. No Show: Defined as an unkept appointment where the patient failed to notify or declare to the practice their intention or inability to keep the appointment.  Corrective process for repeat offenders:  1. Tardiness: Three (3) episodes of rescheduling due to late arrivals will be recorded as one (1) "No Show". 2. Cancellation or reschedule: Three (3) cancellations or rescheduling will be recorded as one (1) "No Show". 3. "No Shows": Three (3) "No Shows" within a 12 month period will result in discharge from the  practice.  ____________________________________________________________________________________________   ____________________________________________________________________________________________  Medication Rules  Applies to: All patients receiving prescriptions (written or  electronic).  Pharmacy of record: Pharmacy where electronic prescriptions will be sent. If written prescriptions are taken to a different pharmacy, please inform the nursing staff. The pharmacy listed in the electronic medical record should be the one where you would like electronic prescriptions to be sent.  Prescription refills: Only during scheduled appointments. Applies to both, written and electronic prescriptions.  NOTE: The following applies primarily to controlled substances (Opioid Pain Medications)  Patient's responsibilities: 1. Pain Pills: Bring all pain pills to every appointment (except for procedure appointments). 2. Pill Bottles: Bring pills in original pharmacy bottle. Always bring newest bottle. Bring bottle, even if empty. 3. Medication refills: You are responsible for knowing and keeping track of what medications you need refilled. The day before your appointment, write a list of all prescriptions that need to be refilled. Bring that list to your appointment and give it to the admitting nurse. Prescriptions will be written only during appointments. If you forget a medication, it will not be "Called in", "Faxed", or "electronically sent". You will need to get another appointment to get these prescribed. 4. Prescription Accuracy: You are responsible for carefully inspecting your prescriptions before leaving our office. Have the discharge nurse carefully go over each prescription with you, before taking them home. Make sure that your name is accurately spelled, that your address is correct. Check the name and dose of your medication to make sure it is accurate. Check the number of pills, and the written  instructions to make sure they are clear and accurate. Make sure that you are given enough medication to last until your next medication refill appointment. 5. Taking Medication: Take medication as prescribed. Never take more pills than instructed. Never take medication more frequently than prescribed. Taking less pills or less frequently is permitted and encouraged, when it comes to controlled substances (written prescriptions).  6. Inform other Doctors: Always inform, all of your healthcare providers, of all the medications you take. 7. Pain Medication from other Providers: You are not allowed to accept any additional pain medication from any other Doctor or Healthcare provider. There are two exceptions to this rule. (see below) In the event that you require additional pain medication, you are responsible for notifying us, as stated below. 8. Medication Agreement: You are responsible for carefully reading and following our Medication Agreement. This must be signed before receiving any prescriptions from our practice. Safely store a copy of your signed Agreement. Violations to the Agreement will result in no further prescriptions. (Additional copies of our Medication Agreement are available upon request.) 9. Laws, Rules, & Regulations: All patients are expected to follow all Federal and [employer], Rules, [employer]. Ignorance of the Laws does not constitute a valid excuse.  Exceptions: There are only two exceptions to the rule of not receiving pain medications from other Healthcare Providers. 1. Exception #1 (Emergencies): In the event of an emergency (i.e.: accident requiring emergency care), you are allowed to receive additional pain medication. However, you are responsible for: As soon as you are able, call our office [phone], at any time of the day or night, and leave a message stating your name, the date and nature of the emergency, and the name and dose of the medication  prescribed. In the event that your call is answered by a member of our staff, make sure to document and save the date, time, and the name of the person that took your information.  2. Exception #2 (Planned Surgery): In the event that  you are scheduled by another doctor or dentist to have any type of surgery or procedure, you are allowed (for a period no longer than 30 days), to receive additional pain medication, for the acute post-op pain. However, in this case, you are responsible for picking up a copy of our "Post-op Pain Management for Surgeons" handout, and giving it to your surgeon or dentist. This document is available at our office, and does not require an appointment to obtain it. Simply go to our office during business hours (Monday-Thursday from 8:00 AM to 4:00 PM) (Friday 8:00 AM to 12:00 Noon) or if you have a scheduled appointment with [place], prior to your surgery, and ask for it by name. In addition, you will need to provide [place] with your name, name of your surgeon, type of surgery, and date of procedure or surgery.  ____________________________________________________________________________________________ ____________________________________________________________________________________________  Pain Scale  Introduction: The pain score used by this practice is the Verbal Numerical Rating Scale (VNRS-11). This is an 11-point scale. It is for adults and children 10 years or older. There are significant differences in how the pain score is reported, used, and applied. Forget everything you learned in the past and learn this scoring system.  General Information: The scale should reflect your current level of pain. Unless you are specifically asked for the level of your worst pain, or your average pain. If you are asked for one of these two, then it should be understood that it is over the past 24 hours.  Basic Activities of Daily Living (ADL): Personal hygiene, dressing, eating, transferring,  and using restroom.  Instructions: Most patients tend to report their level of pain as a combination of two factors, their physical pain and their psychosocial pain. This last one is also known as "suffering" and it is reflection of how physical pain affects you socially and psychologically. From now on, report them separately. From this point on, when asked to report your pain level, report only your physical pain. Use the following table for reference.  Pain Clinic Pain Levels (0-5/10)  Pain Level Score  Description  No Pain 0   Mild pain 1 Nagging, annoying, but does not interfere with basic activities of daily living (ADL). Patients are able to eat, bathe, get dressed, toileting (being able to get on and off the toilet and perform personal hygiene functions), transfer (move in and out of bed or a chair without assistance), and maintain continence (able to control bladder and bowel functions). Blood pressure and heart rate are unaffected. A normal heart rate for a healthy adult ranges from 60 to 100 bpm (beats per minute).   Mild to moderate pain 2 Noticeable and distracting. Impossible to hide from other people. More frequent flare-ups. Still possible to adapt and function close to normal. It can be very annoying and may have occasional stronger flare-ups. With discipline, patients may get used to it and adapt.   Moderate pain 3 Interferes significantly with activities of daily living (ADL). It becomes difficult to feed, bathe, get dressed, get on and off the toilet or to perform personal hygiene functions. Difficult to get in and out of bed or a chair without assistance. Very distracting. With effort, it can be ignored when deeply involved in activities.   Moderately severe pain 4 Impossible to ignore for more than a few minutes. With effort, patients may still be able to manage work or participate in some social activities. Very difficult to concentrate. Signs of autonomic nervous system  discharge  are evident: dilated pupils (mydriasis); mild sweating (diaphoresis); sleep interference. Heart rate becomes elevated (>115 bpm). Diastolic blood pressure (lower number) rises above 100 mmHg. Patients find relief in laying down and not moving.   Severe pain 5 Intense and extremely unpleasant. Associated with frowning face and frequent crying. Pain overwhelms the senses.  Ability to do any activity or maintain social relationships becomes significantly limited. Conversation becomes difficult. Pacing back and forth is common, as getting into a comfortable position is nearly impossible. Pain wakes you up from deep sleep. Physical signs will be obvious: pupillary dilation; increased sweating; goosebumps; brisk reflexes; cold, clammy hands and feet; nausea, vomiting or dry heaves; loss of appetite; significant sleep disturbance with inability to fall asleep or to remain asleep. When persistent, significant weight loss is observed due to the complete loss of appetite and sleep deprivation.  Blood pressure and heart rate becomes significantly elevated. Caution: If elevated blood pressure triggers a pounding headache associated with blurred vision, then the patient should immediately seek attention at an urgent or emergency care unit, as these may be signs of an impending stroke.    Emergency Department Pain Levels (6-10/10)  Emergency Room Pain 6 Severely limiting. Requires emergency care and should not be seen or managed at an outpatient pain management facility. Communication becomes difficult and requires great effort. Assistance to reach the emergency department may be required. Facial flushing and profuse sweating along with potentially dangerous increases in heart rate and blood pressure will be evident.   Distressing pain 7 Self-care is very difficult. Assistance is required to transport, or use restroom. Assistance to reach the emergency department will be required. Tasks requiring coordination,  such as bathing and getting dressed become very difficult.   Disabling pain 8 Self-care is no longer possible. At this level, pain is disabling. The individual is unable to do even the most "basic" activities such as walking, eating, bathing, dressing, transferring to a bed, or toileting. Fine motor skills are lost. It is difficult to think clearly.   Incapacitating pain 9 Pain becomes incapacitating. Thought processing is no longer possible. Difficult to remember your own name. Control of movement and coordination are lost.   The worst pain imaginable 10 At this level, most patients pass out from pain. When this level is reached, collapse of the autonomic nervous system occurs, leading to a sudden drop in blood pressure and heart rate. This in turn results in a temporary and dramatic drop in blood flow to the brain, leading to a loss of consciousness. Fainting is one of the body's self defense mechanisms. Passing out puts the brain in a calmed state and causes it to shut down for a while, in order to begin the healing process.    Summary: 1. Refer to this scale when providing [place] with your pain level. 2. Be accurate and careful when reporting your pain level. This will help with your care. 3. Over-reporting your pain level will lead to loss of credibility. 4. Even a level of 1/10 means that there is pain and will be treated at our facility. 5. High, inaccurate reporting will be documented as "Symptom Exaggeration", leading to loss of credibility and suspicions of possible secondary gains such as obtaining more narcotics, or wanting to appear disabled, for fraudulent reasons. 6. Only pain levels of 5 or below will be seen at our facility. 7. Pain levels of 6 and above will be sent to the Emergency Department and the appointment  cancelled. ____________________________________________________________________________________________  ____________________________________________________________________________________________  Preparing for Procedure with Sedation Instructions: . Oral Intake: Do not eat or drink anything for at least 8 hours prior to your procedure. . Transportation: Public transportation is not allowed. Bring an adult driver. The driver must be physically present in our waiting room before any procedure can be started. Marland Kitchen Physical Assistance: Bring an adult physically capable of assisting you, in the event you need help. This adult should keep you company at home for at least 6 hours after the procedure. . Blood Pressure Medicine: Take your blood pressure medicine with a sip of water the morning of the procedure. . Blood thinners:  . Diabetics on insulin: Notify the staff so that you can be scheduled 1st case in the morning. If your diabetes requires high dose insulin, take only  of your normal insulin dose the morning of the procedure and notify the staff that you have done so. . Preventing infections: Shower with an antibacterial soap the morning of your procedure. . Build-up your immune system: Take 1000 mg of Vitamin C with every meal (3 times a day) the day prior to your procedure. Marland Kitchen Antibiotics: Inform the staff if you have a condition or reason that requires you to take antibiotics before dental procedures. . Pregnancy: If you are pregnant, call and cancel the procedure. . Sickness: If you have a cold, fever, or any active infections, call and cancel the procedure. . Arrival: You must be in the facility at least 30 minutes prior to your scheduled procedure. . Children: Do not bring children with you. . Dress appropriately: Bring dark clothing that you would not mind if they get stained. . Valuables: Do not bring any jewelry or valuables. Procedure appointments are reserved for interventional  treatments only. Marland Kitchen No Prescription Refills. . No medication changes will be discussed during procedure appointments. . No disability issues will be discussed. ____________________________________________________________________________________________  GENERAL RISKS AND COMPLICATIONS  What are the risk, side effects and possible complications? Generally speaking, most procedures are safe.  However, with any procedure there are risks, side effects, and the possibility of complications.  The risks and complications are dependent upon the sites that are lesioned, or the type of nerve block to be performed.  The closer the procedure is to the spine, the more serious the risks are.  Great care is taken when placing the radio frequency needles, block needles or lesioning probes, but sometimes complications can occur. 1. Infection: Any time there is an injection through the skin, there is a risk of infection.  This is why sterile conditions are used for these blocks.  There are four possible types of infection. 1. Localized skin infection. 2. Central Nervous System Infection-This can be in the form of Meningitis, which can be deadly. 3. Epidural Infections-This can be in the form of an epidural abscess, which can cause pressure inside of the spine, causing compression of the spinal cord with subsequent paralysis. This would require an emergency surgery to decompress, and there are no guarantees that the patient would recover from the paralysis. 4. Discitis-This is an infection of the intervertebral discs.  It occurs in about 1% of discography procedures.  It is difficult to treat and it may lead to surgery.        2. Pain: the needles have to go through skin and soft tissues, will cause soreness.       3. Damage to internal structures:  The nerves to be lesioned may be near blood vessels or    other nerves  which can be potentially damaged.       4. Bleeding: Bleeding is more common if the patient is  taking blood thinners such as  aspirin, Coumadin, Ticiid, Plavix, etc., or if he/she have some genetic predisposition  such as hemophilia. Bleeding into the spinal canal can cause compression of the spinal  cord with subsequent paralysis.  This would require an emergency surgery to  decompress and there are no guarantees that the patient would recover from the  paralysis.       5. Pneumothorax:  Puncturing of a lung is a possibility, every time a needle is introduced in  the area of the chest or upper back.  Pneumothorax refers to free air around the  collapsed lung(s), inside of the thoracic cavity (chest cavity).  Another two possible  complications related to a similar event would include: Hemothorax and Chylothorax.   These are variations of the Pneumothorax, where instead of air around the collapsed  lung(s), you may have blood or chyle, respectively.       6. Spinal headaches: They may occur with any procedures in the area of the spine.       7. Persistent CSF (Cerebro-Spinal Fluid) leakage: This is a rare problem, but may occur  with prolonged intrathecal or epidural catheters either due to the formation of a fistulous  track or a dural tear.       8. Nerve damage: By working so close to the spinal cord, there is always a possibility of  nerve damage, which could be as serious as a permanent spinal cord injury with  paralysis.       9. Death:  Although rare, severe deadly allergic reactions known as "Anaphylactic  reaction" can occur to any of the medications used.      10. Worsening of the symptoms:  We can always make thing worse.  What are the chances of something like this happening? Chances of any of this occuring are extremely low.  By statistics, you have more of a chance of getting killed in a motor vehicle accident: while driving to the hospital than any of the above occurring .  Nevertheless, you should be aware that they are possibilities.  In general, it is similar to taking a shower.   Everybody knows that you can slip, hit your head and get killed.  Does that mean that you should not shower again?  Nevertheless always keep in mind that statistics do not mean anything if you happen to be on the wrong side of them.  Even if a procedure has a 1 (one) in a 1,000,000 (million) chance of going wrong, it you happen to be that one..Also, keep in mind that by statistics, you have more of a chance of having something go wrong when taking medications.  Who should not have this procedure? If you are on a blood thinning medication (e.g. Coumadin, Plavix, see list of "Blood Thinners"), or if you have an active infection going on, you should not have the procedure.  If you are taking any blood thinners, please inform your physician.  How should I prepare for this procedure?  Do not eat or drink anything at least six hours prior to the procedure.  Bring a driver with you .  It cannot be a taxi.  Come accompanied by an adult that can drive you back, and that is strong enough to help you if your legs get weak or numb from the local anesthetic.  Take all of  your medicines the morning of the procedure with just enough water to swallow them.  If you have diabetes, make sure that you are scheduled to have your procedure done first thing in the morning, whenever possible.  If you have diabetes, take only half of your insulin dose and notify our nurse that you have done so as soon as you arrive at the clinic.  If you are diabetic, but only take blood sugar pills (oral hypoglycemic), then do not take them on the morning of your procedure.  You may take them after you have had the procedure.  Do not take aspirin or any aspirin-containing medications, at least eleven (11) days prior to the procedure.  They may prolong bleeding.  Wear loose fitting clothing that may be easy to take off and that you would not mind if it got stained with Betadine or blood.  Do not wear any jewelry or perfume  Remove  any nail coloring.  It will interfere with some of our monitoring equipment.  NOTE: Remember that this is not meant to be interpreted as a complete list of all possible complications.  Unforeseen problems may occur.  BLOOD THINNERS The following drugs contain aspirin or other products, which can cause increased bleeding during surgery and should not be taken for 2 weeks prior to and 1 week after surgery.  If you should need take something for relief of minor pain, you may take acetaminophen which is found in Tylenol,m Datril, Anacin-3 and Panadol. It is not blood thinner. The products listed below are.  Do not take any of the products listed below in addition to any listed on your instruction sheet.  A.P.C or A.P.C with Codeine Codeine Phosphate Capsules #3 Ibuprofen Ridaura  ABC compound Congesprin Imuran rimadil  Advil Cope Indocin Robaxisal  Alka-Seltzer Effervescent Pain Reliever and Antacid Coricidin or Coricidin-D  Indomethacin Rufen  Alka-Seltzer plus Cold Medicine Cosprin Ketoprofen S-A-C Tablets  Anacin Analgesic Tablets or Capsules Coumadin Korlgesic Salflex  Anacin Extra Strength Analgesic tablets or capsules CP-2 Tablets Lanoril Salicylate  Anaprox Cuprimine Capsules Levenox Salocol  Anexsia-D Dalteparin Magan Salsalate  Anodynos Darvon compound Magnesium Salicylate Sine-off  Ansaid Dasin Capsules Magsal Sodium Salicylate  Anturane Depen Capsules Marnal Soma  APF Arthritis pain formula Dewitt's Pills Measurin Stanback  Argesic Dia-Gesic Meclofenamic Sulfinpyrazone  Arthritis Bayer Timed Release Aspirin Diclofenac Meclomen Sulindac  Arthritis pain formula Anacin Dicumarol Medipren Supac  Analgesic (Safety coated) Arthralgen Diffunasal Mefanamic Suprofen  Arthritis Strength Bufferin Dihydrocodeine Mepro Compound Suprol  Arthropan liquid Dopirydamole Methcarbomol with Aspirin Synalgos  ASA tablets/Enseals Disalcid Micrainin Tagament  Ascriptin Doan's Midol Talwin  Ascriptin A/D  Dolene Mobidin Tanderil  Ascriptin Extra Strength Dolobid Moblgesic Ticlid  Ascriptin with Codeine Doloprin or Doloprin with Codeine Momentum Tolectin  Asperbuf Duoprin Mono-gesic Trendar  Aspergum Duradyne Motrin or Motrin IB Triminicin  Aspirin plain, buffered or enteric coated Durasal Myochrisine Trigesic  Aspirin Suppositories Easprin Nalfon Trillsate  Aspirin with Codeine Ecotrin Regular or Extra Strength Naprosyn Uracel  Atromid-S Efficin Naproxen Ursinus  Auranofin Capsules Elmiron Neocylate Vanquish  Axotal Emagrin Norgesic Verin  Azathioprine Empirin or Empirin with Codeine Normiflo Vitamin E  Azolid Emprazil Nuprin Voltaren  Bayer Aspirin plain, buffered or children's or timed BC Tablets or powders Encaprin Orgaran Warfarin Sodium  Buff-a-Comp Enoxaparin Orudis Zorpin  Buff-a-Comp with Codeine Equegesic Os-Cal-Gesic   Buffaprin Excedrin plain, buffered or Extra Strength Oxalid   Bufferin Arthritis Strength Feldene Oxphenbutazone   Bufferin plain or Extra Strength Feldene Capsules Oxycodone with  Aspirin   Bufferin with Codeine Fenoprofen Fenoprofen Pabalate or Pabalate-SF   Buffets II Flogesic Panagesic   Buffinol plain or Extra Strength Florinal or Florinal with Codeine Panwarfarin   Buf-Tabs Flurbiprofen Penicillamine   Butalbital Compound Four-way cold tablets Penicillin   Butazolidin Fragmin Pepto-Bismol   Carbenicillin Geminisyn Percodan   Carna Arthritis Reliever Geopen Persantine   Carprofen Gold's salt Persistin   Chloramphenicol Goody's Phenylbutazone   Chloromycetin Haltrain Piroxlcam   Clmetidine heparin Plaquenil   Cllnoril Hyco-pap Ponstel   Clofibrate Hydroxy chloroquine Propoxyphen         Before stopping any of these medications, be sure to consult the physician who ordered them.  Some, such as Coumadin (Warfarin) are ordered to prevent or treat serious conditions such as "deep thrombosis", "pumonary embolisms", and other heart problems.  The amount of time  that you may need off of the medication may also vary with the medication and the reason for which you were taking it.  If you are taking any of these medications, please make sure you notify your pain physician before you undergo any procedures.         Radiofrequency Lesioning Radiofrequency lesioning is a procedure that is performed to relieve pain. The procedure is often used for back, neck, or arm pain. Radiofrequency lesioning involves the use of a machine that creates radio waves to make heat. During the procedure, the heat is applied to the nerve that carries the pain signal. The heat damages the nerve and interferes with the pain signal. Pain relief usually starts about 2 weeks after the procedure and lasts for 6 months to 1 year. Tell a health care provider about:  Any allergies you have.  All medicines you are taking, including vitamins, herbs, eye drops, creams, and over-the-counter medicines.  Any problems you or family members have had with anesthetic medicines.  Any blood disorders you have.  Any surgeries you have had.  Any medical conditions you have.  Whether you are pregnant or may be pregnant. What are the risks? Generally, this is a safe procedure. However, problems may occur, including:  Pain or soreness at the injection site.  Infection at the injection site.  Damage to nerves or blood vessels.  What happens before the procedure?  Ask your health care provider about: ? Changing or stopping your regular medicines. This is especially important if you are taking diabetes medicines or blood thinners. ? Taking medicines such as aspirin and ibuprofen. These medicines can thin your blood. Do not take these medicines before your procedure if your health care provider instructs you not to.  Follow instructions from your health care provider about eating or drinking restrictions.  Plan to have someone take you home after the procedure.  If you go home right  after the procedure, plan to have someone with you for 24 hours. What happens during the procedure?  You will be given one or more of the following: ? A medicine to help you relax (sedative). ? A medicine to numb the area (local anesthetic).  You will be awake during the procedure. You will need to be able to talk with the health care provider during the procedure.  With the help of a type of X-ray (fluoroscopy), the health care provider will insert a radiofrequency needle into the area to be treated.  Next, a wire that carries the radio waves (electrode) will be put through the radiofrequency needle. An electrical pulse will be sent through the electrode to verify the  correct nerve. You will feel a tingling sensation, and you may have muscle twitching.  Then, the tissue that is around the needle tip will be heated by an electric current that is passed using the radiofrequency machine. This will numb the nerves.  A bandage (dressing) will be put on the insertion area after the procedure is done. The procedure may vary among health care providers and hospitals. What happens after the procedure?  Your blood pressure, heart rate, breathing rate, and blood oxygen level will be monitored often until the medicines you were given have worn off.  Return to your normal activities as directed by your health care provider. This information is not intended to replace advice given to you by your health care provider. Make sure you discuss any questions you have with your health care provider. Document Released: 09/05/2010 Document Revised: 06/15/2015 Document Reviewed: 02/14/2014 [employer]vier Interactive Patient Education  [employer].

## 2016-07-11 NOTE — Progress Notes (Signed)
Nursing Pain Medication Assessment:  Safety precautions to be maintained throughout the outpatient stay will include: orient to surroundings, keep bed in low position, maintain call bell within reach at all times, provide assistance with transfer out of bed and ambulation.  Medication Inspection Compliance: Pill count conducted under aseptic conditions, in front of the patient. Neither the pills nor the bottle was removed from the patient's sight at any time. Once count was completed pills were immediately returned to the patient in their original bottle.  Medication #1: Hydrocodone/APAP Pill/Patch Count: 0 of 60 pills remain Pill/Patch Appearance: Markings consistent with prescribed medication Bottle Appearance: Standard pharmacy container. Clearly labeled. Filled Date: 05 / 27 / 2018 Last Medication intake:  Ran out of medicine more than 48 hours ago  Medication #2: Oxycodone/APAP Pill/Patch Count: 0 of 30 pills remain Pill/Patch Appearance: Markings consistent with prescribed medication Bottle Appearance: Standard pharmacy container. Clearly labeled. Filled Date: 06 / 06 / 2018 Last Medication intake:  Today

## 2016-07-11 NOTE — Patient Instructions (Addendum)
____________________________________________________________________________________________  Appointment Policy Summary  It is our goal and responsibility to provide the medical community with assistance in the evaluation and management of patients with chronic pain. Unfortunately our resources are limited. Because we do not have an unlimited amount of time, or available appointments, we are required to closely monitor and manage their use. The following rules exist to maximize their use:  Patient's responsibilities: 1. Punctuality: You are required to be physically present and registered in our facility at least 30 minutes before your appointment. 2. Tardiness: The cutoff is your appointment time. If you have an appointment scheduled for 10:00 AM and you arrive at 10:01, you will be required to reschedule your appointment.  3. Plan ahead: Always assume that you will encounter traffic on your way in. Plan for it. If you are dependent on a driver, make sure they understand these rules and the need to arrive early. 4. Other appointments and responsibilities: Avoid scheduling any other appointments before or after your pain clinic appointments.  5. Be prepared: Write down everything that you need to discuss with your healthcare provider and give this information to the admitting nurse. Write down the medications that you will need refilled. Bring your pills and bottles (even the empty ones), to all of your appointments, except for those where a procedure is scheduled. 6. No children or pets: Find someone to take care of them. It is not appropriate to bring them in. 7. Scheduling changes: We request "advanced notification" of any changes or cancellations. 8. Advanced notification: Defined as a time period of more than 24 hours prior to the originally scheduled appointment. This allows for the appointment to be offered to other patients. 9. Rescheduling: When a visit is rescheduled, it will require the  cancellation of the original appointment. For this reason they both fall within the category of "Cancellations".  10. Cancellations: They require advanced notification. Any cancellation less than 24 hours before the  appointment will be recorded as a "No Show". 11. No Show: Defined as an unkept appointment where the patient failed to notify or declare to the practice their intention or inability to keep the appointment.  Corrective process for repeat offenders:  1. Tardiness: Three (3) episodes of rescheduling due to late arrivals will be recorded as one (1) "No Show". 2. Cancellation or reschedule: Three (3) cancellations or rescheduling will be recorded as one (1) "No Show". 3. "No Shows": Three (3) "No Shows" within a 12 month period will result in discharge from the practice.  ____________________________________________________________________________________________   ____________________________________________________________________________________________  Medication Rules  Applies to: All patients receiving prescriptions (written or electronic).  Pharmacy of record: Pharmacy where electronic prescriptions will be sent. If written prescriptions are taken to a different pharmacy, please inform the nursing staff. The pharmacy listed in the electronic medical record should be the one where you would like electronic prescriptions to be sent.  Prescription refills: Only during scheduled appointments. Applies to both, written and electronic prescriptions.  NOTE: The following applies primarily to controlled substances (Opioid Pain Medications)  Patient's responsibilities: 1. Pain Pills: Bring all pain pills to every appointment (except for procedure appointments). 2. Pill Bottles: Bring pills in original pharmacy bottle. Always bring newest bottle. Bring bottle, even if empty. 3. Medication refills: You are responsible for knowing and keeping track of what medications you need  refilled. The day before your appointment, write a list of all prescriptions that need to be refilled. Bring that list to your appointment and give it to  the admitting nurse. Prescriptions will be written only during appointments. If you forget a medication, it will not be "Called in", "Faxed", or "electronically sent". You will need to get another appointment to get these prescribed. 4. Prescription Accuracy: You are responsible for carefully inspecting your prescriptions before leaving our office. Have the discharge nurse carefully go over each prescription with you, before taking them home. Make sure that your name is accurately spelled, that your address is correct. Check the name and dose of your medication to make sure it is accurate. Check the number of pills, and the written instructions to make sure they are clear and accurate. Make sure that you are given enough medication to last until your next medication refill appointment. 5. Taking Medication: Take medication as prescribed. Never take more pills than instructed. Never take medication more frequently than prescribed. Taking less pills or less frequently is permitted and encouraged, when it comes to controlled substances (written prescriptions).  6. Inform other Doctors: Always inform, all of your healthcare providers, of all the medications you take. 7. Pain Medication from other Providers: You are not allowed to accept any additional pain medication from any other Doctor or Healthcare provider. There are two exceptions to this rule. (see below) In the event that you require additional pain medication, you are responsible for notifying us, as stated below. 8. Medication Agreement: You are responsible for carefully reading and following our Medication Agreement. This must be signed before receiving any prescriptions from our practice. Safely store a copy of your signed Agreement. Violations to the Agreement will result in no further prescriptions.  (Additional copies of our Medication Agreement are available upon request.) 9. Laws, Rules, & Regulations: All patients are expected to follow all Federal and [employer], Rules, [employer]. Ignorance of the Laws does not constitute a valid excuse.  Exceptions: There are only two exceptions to the rule of not receiving pain medications from other Healthcare Providers. 1. Exception #1 (Emergencies): In the event of an emergency (i.e.: accident requiring emergency care), you are allowed to receive additional pain medication. However, you are responsible for: As soon as you are able, call our office [phone], at any time of the day or night, and leave a message stating your name, the date and nature of the emergency, and the name and dose of the medication prescribed. In the event that your call is answered by a member of our staff, make sure to document and save the date, time, and the name of the person that took your information.  2. Exception #2 (Planned Surgery): In the event that you are scheduled by another doctor or dentist to have any type of surgery or procedure, you are allowed (for a period no longer than 30 days), to receive additional pain medication, for the acute post-op pain. However, in this case, you are responsible for picking up a copy of our "Post-op Pain Management for Surgeons" handout, and giving it to your surgeon or dentist. This document is available at our office, and does not require an appointment to obtain it. Simply go to our office during business hours (Monday-Thursday from 8:00 AM to 4:00 PM) (Friday 8:00 AM to 12:00 Noon) or if you have a scheduled appointment with [place], prior to your surgery, and ask for it by name. In addition, you will need to provide [place] with your name, name of your surgeon, type of surgery, and date of procedure or  surgery.  ____________________________________________________________________________________________ ____________________________________________________________________________________________  Pain Scale  Introduction: The pain score used by this practice is the Verbal Numerical Rating Scale (VNRS-11). This is an 11-point scale. It is for adults and children 10 years or older. There are significant differences in how the pain score is reported, used, and applied. Forget everything you learned in the past and learn this scoring system.  General Information: The scale should reflect your current level of pain. Unless you are specifically asked for the level of your worst pain, or your average pain. If you are asked for one of these two, then it should be understood that it is over the past 24 hours.  Basic Activities of Daily Living (ADL): Personal hygiene, dressing, eating, transferring, and using restroom.  Instructions: Most patients tend to report their level of pain as a combination of two factors, their physical pain and their psychosocial pain. This last one is also known as "suffering" and it is reflection of how physical pain affects you socially and psychologically. From now on, report them separately. From this point on, when asked to report your pain level, report only your physical pain. Use the following table for reference.  Pain Clinic Pain Levels (0-5/10)  Pain Level Score  Description  No Pain 0   Mild pain 1 Nagging, annoying, but does not interfere with basic activities of daily living (ADL). Patients are able to eat, bathe, get dressed, toileting (being able to get on and off the toilet and perform personal hygiene functions), transfer (move in and out of bed or a chair without assistance), and maintain continence (able to control bladder and bowel functions). Blood pressure and heart rate are unaffected. A normal heart rate for a healthy adult ranges from 60 to 100 bpm  (beats per minute).   Mild to moderate pain 2 Noticeable and distracting. Impossible to hide from other people. More frequent flare-ups. Still possible to adapt and function close to normal. It can be very annoying and may have occasional stronger flare-ups. With discipline, patients may get used to it and adapt.   Moderate pain 3 Interferes significantly with activities of daily living (ADL). It becomes difficult to feed, bathe, get dressed, get on and off the toilet or to perform personal hygiene functions. Difficult to get in and out of bed or a chair without assistance. Very distracting. With effort, it can be ignored when deeply involved in activities.   Moderately severe pain 4 Impossible to ignore for more than a few minutes. With effort, patients may still be able to manage work or participate in some social activities. Very difficult to concentrate. Signs of autonomic nervous system discharge are evident: dilated pupils (mydriasis); mild sweating (diaphoresis); sleep interference. Heart rate becomes elevated (>115 bpm). Diastolic blood pressure (lower number) rises above 100 mmHg. Patients find relief in laying down and not moving.   Severe pain 5 Intense and extremely unpleasant. Associated with frowning face and frequent crying. Pain overwhelms the senses.  Ability to do any activity or maintain social relationships becomes significantly limited. Conversation becomes difficult. Pacing back and forth is common, as getting into a comfortable position is nearly impossible. Pain wakes you up from deep sleep. Physical signs will be obvious: pupillary dilation; increased sweating; goosebumps; brisk reflexes; cold, clammy hands and feet; nausea, vomiting or dry heaves; loss of appetite; significant sleep disturbance with inability to fall asleep or to remain asleep. When persistent, significant weight loss is observed due to the complete loss of appetite and sleep deprivation.  Blood pressure and heart  rate  becomes significantly elevated. Caution: If elevated blood pressure triggers a pounding headache associated with blurred vision, then the patient should immediately seek attention at an urgent or emergency care unit, as these may be signs of an impending stroke.    Emergency Department Pain Levels (6-10/10)  Emergency Room Pain 6 Severely limiting. Requires emergency care and should not be seen or managed at an outpatient pain management facility. Communication becomes difficult and requires great effort. Assistance to reach the emergency department may be required. Facial flushing and profuse sweating along with potentially dangerous increases in heart rate and blood pressure will be evident.   Distressing pain 7 Self-care is very difficult. Assistance is required to transport, or use restroom. Assistance to reach the emergency department will be required. Tasks requiring coordination, such as bathing and getting dressed become very difficult.   Disabling pain 8 Self-care is no longer possible. At this level, pain is disabling. The individual is unable to do even the most "basic" activities such as walking, eating, bathing, dressing, transferring to a bed, or toileting. Fine motor skills are lost. It is difficult to think clearly.   Incapacitating pain 9 Pain becomes incapacitating. Thought processing is no longer possible. Difficult to remember your own name. Control of movement and coordination are lost.   The worst pain imaginable 10 At this level, most patients pass out from pain. When this level is reached, collapse of the autonomic nervous system occurs, leading to a sudden drop in blood pressure and heart rate. This in turn results in a temporary and dramatic drop in blood flow to the brain, leading to a loss of consciousness. Fainting is one of the body's self defense mechanisms. Passing out puts the brain in a calmed state and causes it to shut down for a while, in order to begin the  healing process.    Summary: 1. Refer to this scale when providing [place] with your pain level. 2. Be accurate and careful when reporting your pain level. This will help with your care. 3. Over-reporting your pain level will lead to loss of credibility. 4. Even a level of 1/10 means that there is pain and will be treated at our facility. 5. High, inaccurate reporting will be documented as "Symptom Exaggeration", leading to loss of credibility and suspicions of possible secondary gains such as obtaining more narcotics, or wanting to appear disabled, for fraudulent reasons. 6. Only pain levels of 5 or below will be seen at our facility. 7. Pain levels of 6 and above will be sent to the Emergency Department and the appointment cancelled. ____________________________________________________________________________________________  ____________________________________________________________________________________________  Preparing for Procedure with Sedation Instructions: . Oral Intake: Do not eat or drink anything for at least 8 hours prior to your procedure. . Transportation: Public transportation is not allowed. Bring an adult driver. The driver must be physically present in our waiting room before any procedure can be started. Marland Kitchen Physical Assistance: Bring an adult physically capable of assisting you, in the event you need help. This adult should keep you company at home for at least 6 hours after the procedure. . Blood Pressure Medicine: Take your blood pressure medicine with a sip of water the morning of the procedure. . Blood thinners:  . Diabetics on insulin: Notify the staff so that you can be scheduled 1st case in the morning. If your diabetes requires high dose insulin, take only  of your normal insulin dose the morning of the procedure and notify the staff that you have done  so. . Preventing infections: Shower with an antibacterial soap the morning of your procedure. . Build-up your  immune system: Take 1000 mg of Vitamin C with every meal (3 times a day) the day prior to your procedure. Marland Kitchen Antibiotics: Inform the staff if you have a condition or reason that requires you to take antibiotics before dental procedures. . Pregnancy: If you are pregnant, call and cancel the procedure. . Sickness: If you have a cold, fever, or any active infections, call and cancel the procedure. . Arrival: You must be in the facility at least 30 minutes prior to your scheduled procedure. . Children: Do not bring children with you. . Dress appropriately: Bring dark clothing that you would not mind if they get stained. . Valuables: Do not bring any jewelry or valuables. Procedure appointments are reserved for interventional treatments only. Marland Kitchen No Prescription Refills. . No medication changes will be discussed during procedure appointments. . No disability issues will be discussed. ____________________________________________________________________________________________  GENERAL RISKS AND COMPLICATIONS  What are the risk, side effects and possible complications? Generally speaking, most procedures are safe.  However, with any procedure there are risks, side effects, and the possibility of complications.  The risks and complications are dependent upon the sites that are lesioned, or the type of nerve block to be performed.  The closer the procedure is to the spine, the more serious the risks are.  Great care is taken when placing the radio frequency needles, block needles or lesioning probes, but sometimes complications can occur. 1. Infection: Any time there is an injection through the skin, there is a risk of infection.  This is why sterile conditions are used for these blocks.  There are four possible types of infection. 1. Localized skin infection. 2. Central Nervous System Infection-This can be in the form of Meningitis, which can be deadly. 3. Epidural Infections-This can be in the form of an  epidural abscess, which can cause pressure inside of the spine, causing compression of the spinal cord with subsequent paralysis. This would require an emergency surgery to decompress, and there are no guarantees that the patient would recover from the paralysis. 4. Discitis-This is an infection of the intervertebral discs.  It occurs in about 1% of discography procedures.  It is difficult to treat and it may lead to surgery.        2. Pain: the needles have to go through skin and soft tissues, will cause soreness.       3. Damage to internal structures:  The nerves to be lesioned may be near blood vessels or    other nerves which can be potentially damaged.       4. Bleeding: Bleeding is more common if the patient is taking blood thinners such as  aspirin, Coumadin, Ticiid, Plavix, etc., or if he/she have some genetic predisposition  such as hemophilia. Bleeding into the spinal canal can cause compression of the spinal  cord with subsequent paralysis.  This would require an emergency surgery to  decompress and there are no guarantees that the patient would recover from the  paralysis.       5. Pneumothorax:  Puncturing of a lung is a possibility, every time a needle is introduced in  the area of the chest or upper back.  Pneumothorax refers to free air around the  collapsed lung(s), inside of the thoracic cavity (chest cavity).  Another two possible  complications related to a similar event would include: Hemothorax and Chylothorax.   These  are variations of the Pneumothorax, where instead of air around the collapsed  lung(s), you may have blood or chyle, respectively.       6. Spinal headaches: They may occur with any procedures in the area of the spine.       7. Persistent CSF (Cerebro-Spinal Fluid) leakage: This is a rare problem, but may occur  with prolonged intrathecal or epidural catheters either due to the formation of a fistulous  track or a dural tear.       8. Nerve damage: By working so close  to the spinal cord, there is always a possibility of  nerve damage, which could be as serious as a permanent spinal cord injury with  paralysis.       9. Death:  Although rare, severe deadly allergic reactions known as "Anaphylactic  reaction" can occur to any of the medications used.      10. Worsening of the symptoms:  We can always make thing worse.  What are the chances of something like this happening? Chances of any of this occuring are extremely low.  By statistics, you have more of a chance of getting killed in a motor vehicle accident: while driving to the hospital than any of the above occurring .  Nevertheless, you should be aware that they are possibilities.  In general, it is similar to taking a shower.  Everybody knows that you can slip, hit your head and get killed.  Does that mean that you should not shower again?  Nevertheless always keep in mind that statistics do not mean anything if you happen to be on the wrong side of them.  Even if a procedure has a 1 (one) in a 1,000,000 (million) chance of going wrong, it you happen to be that one..Also, keep in mind that by statistics, you have more of a chance of having something go wrong when taking medications.  Who should not have this procedure? If you are on a blood thinning medication (e.g. Coumadin, Plavix, see list of "Blood Thinners"), or if you have an active infection going on, you should not have the procedure.  If you are taking any blood thinners, please inform your physician.  How should I prepare for this procedure?  Do not eat or drink anything at least six hours prior to the procedure.  Bring a driver with you .  It cannot be a taxi.  Come accompanied by an adult that can drive you back, and that is strong enough to help you if your legs get weak or numb from the local anesthetic.  Take all of your medicines the morning of the procedure with just enough water to swallow them.  If you have diabetes, make sure that you  are scheduled to have your procedure done first thing in the morning, whenever possible.  If you have diabetes, take only half of your insulin dose and notify our nurse that you have done so as soon as you arrive at the clinic.  If you are diabetic, but only take blood sugar pills (oral hypoglycemic), then do not take them on the morning of your procedure.  You may take them after you have had the procedure.  Do not take aspirin or any aspirin-containing medications, at least eleven (11) days prior to the procedure.  They may prolong bleeding.  Wear loose fitting clothing that may be easy to take off and that you would not mind if it got stained with Betadine or blood.  Do  not wear any jewelry or perfume  Remove any nail coloring.  It will interfere with some of our monitoring equipment.  NOTE: Remember that this is not meant to be interpreted as a complete list of all possible complications.  Unforeseen problems may occur.  BLOOD THINNERS The following drugs contain aspirin or other products, which can cause increased bleeding during surgery and should not be taken for 2 weeks prior to and 1 week after surgery.  If you should need take something for relief of minor pain, you may take acetaminophen which is found in Tylenol,m Datril, Anacin-3 and Panadol. It is not blood thinner. The products listed below are.  Do not take any of the products listed below in addition to any listed on your instruction sheet.  A.P.C or A.P.C with Codeine Codeine Phosphate Capsules #3 Ibuprofen Ridaura  ABC compound Congesprin Imuran rimadil  Advil Cope Indocin Robaxisal  Alka-Seltzer Effervescent Pain Reliever and Antacid Coricidin or Coricidin-D  Indomethacin Rufen  Alka-Seltzer plus Cold Medicine Cosprin Ketoprofen S-A-C Tablets  Anacin Analgesic Tablets or Capsules Coumadin Korlgesic Salflex  Anacin Extra Strength Analgesic tablets or capsules CP-2 Tablets Lanoril Salicylate  Anaprox Cuprimine Capsules  Levenox Salocol  Anexsia-D Dalteparin Magan Salsalate  Anodynos Darvon compound Magnesium Salicylate Sine-off  Ansaid Dasin Capsules Magsal Sodium Salicylate  Anturane Depen Capsules Marnal Soma  APF Arthritis pain formula Dewitt's Pills Measurin Stanback  Argesic Dia-Gesic Meclofenamic Sulfinpyrazone  Arthritis Bayer Timed Release Aspirin Diclofenac Meclomen Sulindac  Arthritis pain formula Anacin Dicumarol Medipren Supac  Analgesic (Safety coated) Arthralgen Diffunasal Mefanamic Suprofen  Arthritis Strength Bufferin Dihydrocodeine Mepro Compound Suprol  Arthropan liquid Dopirydamole Methcarbomol with Aspirin Synalgos  ASA tablets/Enseals Disalcid Micrainin Tagament  Ascriptin Doan's Midol Talwin  Ascriptin A/D Dolene Mobidin Tanderil  Ascriptin Extra Strength Dolobid Moblgesic Ticlid  Ascriptin with Codeine Doloprin or Doloprin with Codeine Momentum Tolectin  Asperbuf Duoprin Mono-gesic Trendar  Aspergum Duradyne Motrin or Motrin IB Triminicin  Aspirin plain, buffered or enteric coated Durasal Myochrisine Trigesic  Aspirin Suppositories Easprin Nalfon Trillsate  Aspirin with Codeine Ecotrin Regular or Extra Strength Naprosyn Uracel  Atromid-S Efficin Naproxen Ursinus  Auranofin Capsules Elmiron Neocylate Vanquish  Axotal Emagrin Norgesic Verin  Azathioprine Empirin or Empirin with Codeine Normiflo Vitamin E  Azolid Emprazil Nuprin Voltaren  Bayer Aspirin plain, buffered or children's or timed BC Tablets or powders Encaprin Orgaran Warfarin Sodium  Buff-a-Comp Enoxaparin Orudis Zorpin  Buff-a-Comp with Codeine Equegesic Os-Cal-Gesic   Buffaprin Excedrin plain, buffered or Extra Strength Oxalid   Bufferin Arthritis Strength Feldene Oxphenbutazone   Bufferin plain or Extra Strength Feldene Capsules Oxycodone with Aspirin   Bufferin with Codeine Fenoprofen Fenoprofen Pabalate or Pabalate-SF   Buffets II Flogesic Panagesic   Buffinol plain or Extra Strength Florinal or Florinal with  Codeine Panwarfarin   Buf-Tabs Flurbiprofen Penicillamine   Butalbital Compound Four-way cold tablets Penicillin   Butazolidin Fragmin Pepto-Bismol   Carbenicillin Geminisyn Percodan   Carna Arthritis Reliever Geopen Persantine   Carprofen Gold's salt Persistin   Chloramphenicol Goody's Phenylbutazone   Chloromycetin Haltrain Piroxlcam   Clmetidine heparin Plaquenil   Cllnoril Hyco-pap Ponstel   Clofibrate Hydroxy chloroquine Propoxyphen         Before stopping any of these medications, be sure to consult the physician who ordered them.  Some, such as Coumadin (Warfarin) are ordered to prevent or treat serious conditions such as "deep thrombosis", "pumonary embolisms", and other heart problems.  The amount of time that you may need off of the medication  may also vary with the medication and the reason for which you were taking it.  If you are taking any of these medications, please make sure you notify your pain physician before you undergo any procedures.         Radiofrequency Lesioning Radiofrequency lesioning is a procedure that is performed to relieve pain. The procedure is often used for back, neck, or arm pain. Radiofrequency lesioning involves the use of a machine that creates radio waves to make heat. During the procedure, the heat is applied to the nerve that carries the pain signal. The heat damages the nerve and interferes with the pain signal. Pain relief usually starts about 2 weeks after the procedure and lasts for 6 months to 1 year. Tell a health care provider about:  Any allergies you have.  All medicines you are taking, including vitamins, herbs, eye drops, creams, and over-the-counter medicines.  Any problems you or family members have had with anesthetic medicines.  Any blood disorders you have.  Any surgeries you have had.  Any medical conditions you have.  Whether you are pregnant or may be pregnant. What are the risks? Generally, this is a safe  procedure. However, problems may occur, including:  Pain or soreness at the injection site.  Infection at the injection site.  Damage to nerves or blood vessels.  What happens before the procedure?  Ask your health care provider about: ? Changing or stopping your regular medicines. This is especially important if you are taking diabetes medicines or blood thinners. ? Taking medicines such as aspirin and ibuprofen. These medicines can thin your blood. Do not take these medicines before your procedure if your health care provider instructs you not to.  Follow instructions from your health care provider about eating or drinking restrictions.  Plan to have someone take you home after the procedure.  If you go home right after the procedure, plan to have someone with you for 24 hours. What happens during the procedure?  You will be given one or more of the following: ? A medicine to help you relax (sedative). ? A medicine to numb the area (local anesthetic).  You will be awake during the procedure. You will need to be able to talk with the health care provider during the procedure.  With the help of a type of X-ray (fluoroscopy), the health care provider will insert a radiofrequency needle into the area to be treated.  Next, a wire that carries the radio waves (electrode) will be put through the radiofrequency needle. An electrical pulse will be sent through the electrode to verify the correct nerve. You will feel a tingling sensation, and you may have muscle twitching.  Then, the tissue that is around the needle tip will be heated by an electric current that is passed using the radiofrequency machine. This will numb the nerves.  A bandage (dressing) will be put on the insertion area after the procedure is done. The procedure may vary among health care providers and hospitals. What happens after the procedure?  Your blood pressure, heart rate, breathing rate, and blood oxygen level  will be monitored often until the medicines you were given have worn off.  Return to your normal activities as directed by your health care provider. This information is not intended to replace advice given to you by your health care provider. Make sure you discuss any questions you have with your health care provider. Document Released: 09/05/2010 Document Revised: 06/15/2015 Document Reviewed: 02/14/2014 Elsevier Interactive  Patient Education  [employer].

## 2016-07-16 LAB — COMPREHENSIVE METABOLIC PANEL
ALT: 10 IU/L (ref 0–32)
AST: 14 IU/L (ref 0–40)
Albumin/Globulin Ratio: 1.8 (ref 1.2–2.2)
Albumin: 4.4 g/dL (ref 3.5–5.5)
Alkaline Phosphatase: 35 IU/L — ABNORMAL LOW (ref 39–117)
BILIRUBIN TOTAL: 0.3 mg/dL (ref 0.0–1.2)
BUN / CREAT RATIO: 16 (ref 9–23)
BUN: 11 mg/dL (ref 6–24)
CHLORIDE: 103 mmol/L (ref 96–106)
CO2: 23 mmol/L (ref 20–29)
Calcium: 9.7 mg/dL (ref 8.7–10.2)
Creatinine, Ser: 0.7 mg/dL (ref 0.57–1.00)
GFR calc non Af Amer: 99 mL/min/{1.73_m2} (ref >59)
GFR, EST AFRICAN AMERICAN: 114 mL/min/{1.73_m2} (ref >59)
GLOBULIN, TOTAL: 2.4 g/dL (ref 1.5–4.5)
Glucose: 78 mg/dL (ref 65–99)
POTASSIUM: 4.6 mmol/L (ref 3.5–5.2)
SODIUM: 142 mmol/L (ref 134–144)
TOTAL PROTEIN: 6.8 g/dL (ref 6.0–8.5)

## 2016-07-16 LAB — 25-HYDROXY VITAMIN D LCMS D2+D3
25-Hydroxy, Vitamin D-2: <1 ng/mL
25-Hydroxy, Vitamin D: 9.6 ng/mL — ABNORMAL LOW

## 2016-07-16 LAB — 25-HYDROXYVITAMIN D LCMS D2+D3: 25-HYDROXY, VITAMIN D-3: 9.4 ng/mL

## 2016-07-16 LAB — MAGNESIUM: Magnesium: 2.2 mg/dL (ref 1.6–2.3)

## 2016-07-16 LAB — C-REACTIVE PROTEIN: CRP: 6 mg/L — AB (ref 0.0–4.9)

## 2016-07-16 LAB — VITAMIN B12: Vitamin B-12: 310 pg/mL (ref 232–1245)

## 2016-07-16 LAB — SEDIMENTATION RATE: Sed Rate: 7 mm/hr (ref 0–40)

## 2016-07-17 LAB — TOXASSURE SELECT 13 (MW), URINE

## 2016-09-08 ENCOUNTER — Emergency Department: Payer: Medicaid Other

## 2016-09-08 ENCOUNTER — Emergency Department
Admission: EM | Admit: 2016-09-08 | Discharge: 2016-09-08 | Disposition: A | Discharge status: Home / Self Care | Payer: Medicaid Other | Attending: Emergency Medicine | Admitting: Emergency Medicine

## 2016-09-08 DIAGNOSIS — N390 Urinary tract infection, site not specified: Secondary | ICD-10-CM

## 2016-09-08 DIAGNOSIS — Z79899 Other long term (current) drug therapy: Secondary | ICD-10-CM | POA: Diagnosis not present

## 2016-09-08 DIAGNOSIS — F17210 Nicotine dependence, cigarettes, uncomplicated: Secondary | ICD-10-CM | POA: Insufficient documentation

## 2016-09-08 DIAGNOSIS — G8929 Other chronic pain: Secondary | ICD-10-CM | POA: Diagnosis not present

## 2016-09-08 DIAGNOSIS — Z79891 Long term (current) use of opiate analgesic: Secondary | ICD-10-CM | POA: Diagnosis not present

## 2016-09-08 DIAGNOSIS — R319 Hematuria, unspecified: Secondary | ICD-10-CM | POA: Diagnosis present

## 2016-09-08 LAB — CBC
HEMATOCRIT: 40 % (ref 35.0–47.0)
HEMOGLOBIN: 13.4 g/dL (ref 12.0–16.0)
MCH: 29.9 pg (ref 26.0–34.0)
MCHC: 33.4 g/dL (ref 32.0–36.0)
MCV: 89.5 fL (ref 80.0–100.0)
Platelets: 375 10*3/uL (ref 150–440)
RBC: 4.47 MIL/uL (ref 3.80–5.20)
RDW: 13.1 % (ref 11.5–14.5)
WBC: 19.4 10*3/uL — AB (ref 3.6–11.0)

## 2016-09-08 LAB — URINALYSIS, COMPLETE (UACMP) WITH MICROSCOPIC
BILIRUBIN URINE: NEGATIVE
GLUCOSE, UA: NEGATIVE mg/dL
KETONES UR: NEGATIVE mg/dL
NITRITE: NEGATIVE
PH: 6 (ref 5.0–8.0)
Protein, ur: NEGATIVE mg/dL
Specific Gravity, Urine: 1.004 — ABNORMAL LOW (ref 1.005–1.030)

## 2016-09-08 LAB — COMPREHENSIVE METABOLIC PANEL
ALBUMIN: 4.4 g/dL (ref 3.5–5.0)
ALK PHOS: 36 U/L — AB (ref 38–126)
ALT: 16 U/L (ref 14–54)
ANION GAP: 9 (ref 5–15)
AST: 21 U/L (ref 15–41)
BILIRUBIN TOTAL: 0.2 mg/dL — AB (ref 0.3–1.2)
BUN: 14 mg/dL (ref 6–20)
CALCIUM: 9.2 mg/dL (ref 8.9–10.3)
CO2: 26 mmol/L (ref 22–32)
Chloride: 102 mmol/L (ref 101–111)
Creatinine, Ser: 0.85 mg/dL (ref 0.44–1.00)
GFR calc Af Amer: >60 mL/min (ref >60)
GLUCOSE: 95 mg/dL (ref 65–99)
Potassium: 3.4 mmol/L — ABNORMAL LOW (ref 3.5–5.1)
Sodium: 137 mmol/L (ref 135–145)
TOTAL PROTEIN: 7.4 g/dL (ref 6.5–8.1)

## 2016-09-08 LAB — LIPASE, BLOOD: Lipase: 37 U/L (ref 11–51)

## 2016-09-08 MED ORDER — SODIUM CHLORIDE 0.9 % IV SOLN
1000.0000 mL | Freq: Once | INTRAVENOUS | Status: AC
  Administered 2016-09-08: 1000 mL via INTRAVENOUS

## 2016-09-08 MED ORDER — DEXTROSE 5 % IV SOLN
1.0000 g | Freq: Once | INTRAVENOUS | Status: AC
  Administered 2016-09-08: 1 g via INTRAVENOUS
  Filled 2016-09-08: qty 10

## 2016-09-08 MED ORDER — CEPHALEXIN 500 MG PO CAPS: 500.0000 mg | ORAL_CAPSULE | Freq: Two times a day (BID) | ORAL | 0 refills | Status: DC

## 2016-09-08 NOTE — ED Provider Notes (Signed)
[facility] Emergency Department Provider Note   ____________________________________________    I have reviewed the triage vital signs and the nursing notes.   HISTORY  Chief Complaint Hematuria and Pelvic Pain     HPI Tanya [name] is a 56 y.o. female who presents with complaints of dysuria. She also noted when she wiped today after urinating small amount of blood on the tissue. She denies vaginal bleeding. She denies pain in her back. She complains primarily of cramping in her suprapubic area. No fevers or chills reported. No nausea or vomiting. Tolerating by mouth's well. Denies vaginal discharge   Past Medical History:  Diagnosis Date  . Anxiety   . Depression   . Hyperlipidemia   . Lupus   . Overactive bladder     Patient Active Problem List   Diagnosis Date Noted  . Acute postoperative pain 06/26/2016  . Cervical spondylosis 04/15/2016  . Cervical facet syndrome (Bilateral) (R>L) 04/15/2016  . Shoulder radicular pain (Bilateral) (R>L) 04/15/2016  . Musculoskeletal pain 03/07/2016  . Vitamin D insufficiency 12/19/2015  . 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 Anxiety 11/23/2015  . Chronic low back pain (Location of Tertiary source of pain) (Bilateral) (R>L) 11/23/2015  . Chronic neck pain (Location of Primary Source of Pain) (Bilateral) (R>L) 11/23/2015  . Long term prescription benzodiazepine use 11/23/2015  . Long term current use of opiate analgesic 11/23/2015  . Long term prescription opiate use 11/23/2015  . Opiate use (10 MME/Day) 11/23/2015  . Chronic pain syndrome 11/23/2015  . Chronic upper extremity pain (Right) 11/23/2015  . Chronic cervical radicular pain (Right) 11/23/2015  . Chronic shoulder pain (Bilateral) (R>L) 11/23/2015  . Hypercholesterolemia 06/20/2015  . 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 Encounter for long-term (current) use of high-risk medication 06/06/2015  . Pain  medication agreement signed 11/24/2014  . Hyperlipidemia 01/25/2014  . 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 Diverticulitis 10/04/2012  . Osteoarthritis 10/04/2012  . Chronic upper back pain (Location of Secondary source of pain) (Bilateral) (R>L) 08/17/2012  . Urinary incontinence 08/17/2012  . Fibromyalgia 06/04/2012  . SLE (systemic lupus erythematosus) ([place]) 02/18/2012  . Generalized anxiety disorder 01/07/2012  . Major depressive disorder, recurrent ([place]) 09/29/2009    Past Surgical History:  Procedure Laterality Date  . ABDOMINAL HYSTERECTOMY      Prior to Admission medications   Medication Sig Start Date End Date Taking? Authorizing Provider  cephALEXin (KEFLEX) 500 MG capsule Take 1 capsule (500 mg total) by mouth 2 (two) times daily. 09/08/16   Lavonia Drafts, MD  Cholecalciferol (VITAMIN D3) 2000 units capsule Take 1 capsule (2,000 Units total) by mouth daily. 12/19/15   Milinda Pointer, MD  cyclobenzaprine (FLEXERIL) 10 MG tablet Take 1 tablet (10 mg total) by mouth 3 (three) times daily as needed for muscle spasms. 07/14/16 10/12/16  Milinda Pointer, MD  DULoxetine (CYMBALTA) 30 MG capsule TAKE 1 CAPSULE(30 MG) BY MOUTH EVERY DAY 10/18/15 10/07/16  [provider]  gabapentin (NEURONTIN) 300 MG capsule Take 1 capsule (300 mg total) by mouth 3 (three) times daily. 07/14/16 10/12/16  Milinda Pointer, MD  oxyCODONE (OXY IR/ROXICODONE) 5 MG immediate release tablet Take 1 tablet (5 mg total) by mouth 2 (two) times daily. 07/14/16 08/13/16  Milinda Pointer, MD  oxyCODONE (OXY IR/ROXICODONE) 5 MG immediate release tablet Take 1 tablet (5 mg total) by mouth 2 (two) times daily. 08/13/16 09/12/16  Milinda Pointer, MD  oxyCODONE (OXY IR/ROXICODONE) 5 MG immediate release tablet Take  1 tablet (5 mg total) by mouth 2 (two) times daily. 09/12/16 10/12/16  Milinda Pointer, MD     Allergies Trazodone; Bupropion; Methocarbamol; Oxycodone-acetaminophen; Tramadol; and Trazodone and  nefazodone  Family History  Problem Relation Age of Onset  . Cancer Mother   . Hypertension Mother   . Emphysema Mother   . Stroke Mother   . Glaucoma Father   . Heart disease Father   . Diabetes Brother     Social History Social History  Substance Use Topics  . Smoking status: Current Every Day Smoker    Packs/day: 0.50    Types: Cigarettes  . Smokeless tobacco: Never Used  . Alcohol use No    Review of Systems  Constitutional: No fever/chills Eyes: No visual changes.  ENT: No sore throat. Cardiovascular: Denies chest pain. Respiratory: Denies shortness of breath. Gastrointestinal: As above Genitourinary: As above Musculoskeletal: Negative for back pain. Skin: Negative for rash. Neurological: Negative for headaches or weakness   ____________________________________________   PHYSICAL EXAM:  VITAL SIGNS: ED Triage Vitals [09/08/16 1737]  Enc Vitals Group     BP 119/75     Pulse Rate 98     Resp 18     Temp 98.4 F (36.9 C)     Temp Source Oral     SpO2 100 %     Weight 65.8 kg (145 lb)     Height 1.6 m (5\' 3" )     Head Circumference      Peak Flow      Pain Score 9     Pain Loc      Pain Edu?      Excl. in [place]?     Constitutional: Alert and oriented. No acute distress. Pleasant and interactive Eyes: Conjunctivae are normal.   Nose: No congestion/rhinnorhea. Mouth/Throat: Mucous membranes are moist.    Cardiovascular: Normal rate, regular rhythm. Grossly normal heart sounds.  Good peripheral circulation. Respiratory: Normal respiratory effort.  No retractions. Lungs CTAB. Gastrointestinal: Soft and nontender. No distention.  No CVA tenderness.Normal exam Genitourinary: deferred Musculoskeletal: No lower extremity tenderness nor edema.  Warm and well perfused Neurologic:  Normal speech and language. No gross focal neurologic deficits are appreciated.  Skin:  Skin is warm, dry and intact. No rash noted. Psychiatric: Mood and affect are normal.  Speech and behavior are normal.  ____________________________________________   LABS (all labs ordered are listed, but only abnormal results are displayed)  Labs Reviewed  URINALYSIS, COMPLETE (UACMP) WITH MICROSCOPIC - Abnormal; Notable for the following:       Result Value   Color, Urine STRAW (*)    APPearance CLEAR (*)    Specific Gravity, Urine 1.004 (*)    Hgb urine dipstick MODERATE (*)    Leukocytes, UA LARGE (*)    Bacteria, UA RARE (*)    Squamous Epithelial / LPF 0-5 (*)    All other components within normal limits  COMPREHENSIVE METABOLIC PANEL - Abnormal; Notable for the following:    Potassium 3.4 (*)    Alkaline Phosphatase 36 (*)    Total Bilirubin 0.2 (*)    All other components within normal limits  CBC - Abnormal; Notable for the following:    WBC 19.4 (*)    All other components within normal limits  LIPASE, BLOOD   ____________________________________________  EKG  None ____________________________________________  RADIOLOGY  CT renal stone study ____________________________________________   PROCEDURES  Procedure(s) performed: No    Critical Care performed: No ____________________________________________   INITIAL  IMPRESSION / ASSESSMENT AND PLAN / ED COURSE  Pertinent labs & imaging results that were available during my care of the patient were reviewed by me and considered in my medical decision making (see chart for details).  Patient well-appearing and in no acute distress. Exam is reassuring. Vital signs are normal. Urinalysis most consistent with UTI. Patient does have an elevated white blood cell count but again normal vitals, no evidence of sepsis. We will give a dose of IV Rocephin in the emergency department obtain CT renal stone study and reevaluate  Patient CT scan is unremarkable. Patient feels much better after IV fluids. Feel she is appropriate for discharge with antibiotics. She knows to return if any worsening of her  condition    ____________________________________________   FINAL CLINICAL IMPRESSION(S) / ED DIAGNOSES  Final diagnoses:  Lower urinary tract infectious disease      NEW MEDICATIONS STARTED DURING THIS VISIT:  Discharge Medication List as of 09/08/2016 10:35 PM    START taking these medications   Details  cephALEXin (KEFLEX) 500 MG capsule Take 1 capsule (500 mg total) by mouth 2 (two) times daily., Starting Sun 09/08/2016, Print         Note:  This document was prepared using Dragon voice recognition software and may include unintentional dictation errors.    Lavonia Drafts, MD 09/08/16 [id number]

## 2016-09-08 NOTE — ED Triage Notes (Signed)
PT reporting hematuria and pelvic pain that started yesterday. Pt alert and oriented X4, active, cooperative, pt in NAD. RR even and unlabored, color WNL.

## 2016-09-11 ENCOUNTER — Ambulatory Visit: Payer: Medicaid Other | Admitting: Pain Medicine

## 2016-09-12 ENCOUNTER — Ambulatory Visit: Payer: Medicaid Other | Admitting: Pain Medicine

## 2016-10-08 ENCOUNTER — Encounter: Payer: Self-pay | Admitting: Nurse Practitioner

## 2016-10-08 ENCOUNTER — Ambulatory Visit: Payer: Medicaid Other | Attending: Nurse Practitioner | Admitting: Nurse Practitioner

## 2016-10-08 VITALS — BP 126/73 | HR 93 | Temp 98.3°F | Resp 16 | Ht 63.0 in | Wt 142.8 lb

## 2016-10-08 DIAGNOSIS — Z79891 Long term (current) use of opiate analgesic: Secondary | ICD-10-CM | POA: Insufficient documentation

## 2016-10-08 DIAGNOSIS — M791 Myalgia: Secondary | ICD-10-CM | POA: Diagnosis not present

## 2016-10-08 DIAGNOSIS — M797 Fibromyalgia: Secondary | ICD-10-CM | POA: Diagnosis not present

## 2016-10-08 DIAGNOSIS — M4722 Other spondylosis with radiculopathy, cervical region: Secondary | ICD-10-CM | POA: Diagnosis not present

## 2016-10-08 DIAGNOSIS — F17210 Nicotine dependence, cigarettes, uncomplicated: Secondary | ICD-10-CM | POA: Diagnosis not present

## 2016-10-08 DIAGNOSIS — M4692 Unspecified inflammatory spondylopathy, cervical region: Secondary | ICD-10-CM

## 2016-10-08 DIAGNOSIS — Z833 Family history of diabetes mellitus: Secondary | ICD-10-CM | POA: Insufficient documentation

## 2016-10-08 DIAGNOSIS — Z79899 Other long term (current) drug therapy: Secondary | ICD-10-CM | POA: Insufficient documentation

## 2016-10-08 DIAGNOSIS — Z809 Family history of malignant neoplasm, unspecified: Secondary | ICD-10-CM | POA: Insufficient documentation

## 2016-10-08 DIAGNOSIS — F329 Major depressive disorder, single episode, unspecified: Secondary | ICD-10-CM | POA: Insufficient documentation

## 2016-10-08 DIAGNOSIS — Z8249 Family history of ischemic heart disease and other diseases of the circulatory system: Secondary | ICD-10-CM | POA: Insufficient documentation

## 2016-10-08 DIAGNOSIS — Z825 Family history of asthma and other chronic lower respiratory diseases: Secondary | ICD-10-CM | POA: Insufficient documentation

## 2016-10-08 DIAGNOSIS — R32 Unspecified urinary incontinence: Secondary | ICD-10-CM | POA: Diagnosis not present

## 2016-10-08 DIAGNOSIS — K7689 Other specified diseases of liver: Secondary | ICD-10-CM | POA: Insufficient documentation

## 2016-10-08 DIAGNOSIS — Z888 Allergy status to other drugs, medicaments and biological substances status: Secondary | ICD-10-CM | POA: Diagnosis not present

## 2016-10-08 DIAGNOSIS — N3281 Overactive bladder: Secondary | ICD-10-CM | POA: Diagnosis not present

## 2016-10-08 DIAGNOSIS — M25512 Pain in left shoulder: Secondary | ICD-10-CM | POA: Insufficient documentation

## 2016-10-08 DIAGNOSIS — F411 Generalized anxiety disorder: Secondary | ICD-10-CM | POA: Insufficient documentation

## 2016-10-08 DIAGNOSIS — I700 Atherosclerosis of aorta: Secondary | ICD-10-CM | POA: Diagnosis not present

## 2016-10-08 DIAGNOSIS — E559 Vitamin D deficiency, unspecified: Secondary | ICD-10-CM | POA: Insufficient documentation

## 2016-10-08 DIAGNOSIS — G8929 Other chronic pain: Secondary | ICD-10-CM

## 2016-10-08 DIAGNOSIS — M5412 Radiculopathy, cervical region: Secondary | ICD-10-CM

## 2016-10-08 DIAGNOSIS — Z885 Allergy status to narcotic agent status: Secondary | ICD-10-CM | POA: Insufficient documentation

## 2016-10-08 DIAGNOSIS — G8918 Other acute postprocedural pain: Secondary | ICD-10-CM | POA: Diagnosis not present

## 2016-10-08 DIAGNOSIS — M542 Cervicalgia: Secondary | ICD-10-CM

## 2016-10-08 DIAGNOSIS — Z8489 Family history of other specified conditions: Secondary | ICD-10-CM | POA: Insufficient documentation

## 2016-10-08 DIAGNOSIS — M25511 Pain in right shoulder: Secondary | ICD-10-CM | POA: Insufficient documentation

## 2016-10-08 DIAGNOSIS — G894 Chronic pain syndrome: Secondary | ICD-10-CM | POA: Diagnosis not present

## 2016-10-08 DIAGNOSIS — Z823 Family history of stroke: Secondary | ICD-10-CM | POA: Diagnosis not present

## 2016-10-08 DIAGNOSIS — M47812 Spondylosis without myelopathy or radiculopathy, cervical region: Secondary | ICD-10-CM

## 2016-10-08 DIAGNOSIS — Z90710 Acquired absence of both cervix and uterus: Secondary | ICD-10-CM | POA: Diagnosis not present

## 2016-10-08 DIAGNOSIS — E7800 Pure hypercholesterolemia, unspecified: Secondary | ICD-10-CM | POA: Insufficient documentation

## 2016-10-08 DIAGNOSIS — L930 Discoid lupus erythematosus: Secondary | ICD-10-CM | POA: Insufficient documentation

## 2016-10-08 DIAGNOSIS — M7918 Myalgia, other site: Secondary | ICD-10-CM

## 2016-10-08 MED ORDER — GABAPENTIN 300 MG PO CAPS: 300.0000 mg | ORAL_CAPSULE | Freq: Three times a day (TID) | ORAL | 0 refills | Status: DC

## 2016-10-08 MED ORDER — CYCLOBENZAPRINE HCL 10 MG PO TABS: 10.0000 mg | ORAL_TABLET | Freq: Three times a day (TID) | ORAL | 2 refills | Status: DC | PRN

## 2016-10-08 MED ORDER — OXYCODONE HCL 5 MG PO TABS: 5.0000 mg | ORAL_TABLET | Freq: Two times a day (BID) | ORAL | 0 refills | Status: DC

## 2016-10-08 MED ORDER — OXYCODONE HCL 5 MG PO TABS
5.0000 mg | ORAL_TABLET | Freq: Two times a day (BID) | ORAL | 0 refills | Status: DC
Start: 2016-11-11 — End: 2017-01-07

## 2016-10-08 NOTE — Progress Notes (Signed)
Patient's Name: Tanya [name]  MRN: [id number]  Referring Provider: Ricardo Jericho*  DOB: Aug 15, 1960  PCP: Ricardo Jericho, NP  DOS: 10/08/2016  Note by: Vevelyn Francois NP  Service setting: Ambulatory outpatient  Specialty: Interventional Pain Management  Location: [facility] (AMB) Pain Management Facility    Patient type: Established    Primary Reason(s) for Visit: Encounter for prescription drug management. (Level of risk: moderate)  CC: Neck Pain (left)  HPI  Tanya [name] is a 56 y.o. year old, female patient, who comes today for a medication management evaluation. She has Anxiety; Chronic upper back pain (Location of Secondary source of pain) (Bilateral) (R>L); Chronic discoid lupus erythematosus; Chronic low back pain (Location of Tertiary source of pain) (Bilateral) (R>L); Chronic neck pain (Location of Primary Source of Pain) (Bilateral) (R>L); Discoid lupus; Diverticulitis; Encounter for long-term (current) use of high-risk medication; Fibromyalgia; Generalized anxiety disorder; Hypercholesterolemia; Hyperlipidemia; Major depressive disorder, recurrent ([place]); Pain medication agreement signed; Osteoarthritis; SLE (systemic lupus erythematosus) ([place]); Urinary incontinence; Long term prescription benzodiazepine use; Long term current use of opiate analgesic; Long term prescription opiate use; Opiate use (10 MME/Day); Chronic pain syndrome; Chronic upper extremity pain (Right); Chronic cervical radicular pain (Right); Chronic shoulder pain (Bilateral) (R>L); 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 Her primarily concern today is the Neck Pain (left)  Pain Assessment: Location: Left Neck Radiating: shoulders Onset:   Duration: Chronic pain Quality: Aching, Burning,  Constant, Discomfort, Sore Severity: 7 /10 (self-reported pain score)  Note: Reported level is compatible with observation.                   Effect on ADL: can use arm much Timing:   Modifying factors: rest  Tanya [name] was last scheduled for an appointment on Visit date not found for medication management. During today's appointment we reviewed Tanya [name]'s chronic pain status, as well as her outpatient medication regimen. She states that the pain radiates into her upper back into her shoulder. She denies any arm or hand pain. She has burning and tingling. She admits that the left side is worse than the right. She does not feel like she needs a right RFA.    The patient  reports that she does not use drugs. Her body mass index is 25.3 kg/m.  Further details on both, my assessment(s), as well as the proposed treatment plan, please see below.  Controlled Substance Pharmacotherapy Assessment REMS (Risk Evaluation and Mitigation Strategy)  Analgesic:Oxycodone IR 5 mg 1 tablet by mouth twice a day (10 mg/day of oxycodone). MME/day: 21m/day.  GIgnatius Specking RN  10/08/2016  1:54 PM  Sign at close encounter Nursing Pain Medication Assessment:  Safety precautions to be maintained throughout the outpatient stay will include: orient to surroundings, keep bed in low position, maintain call bell within reach at all times, provide assistance with transfer out of bed and ambulation.  Medication Inspection Compliance: Pill count conducted under aseptic conditions, in front of the patient. Neither the pills nor the bottle was removed from the patient's sight at any time. Once count was completed pills were immediately returned to the patient in their original bottle.  Medication: See above Pill/Patch Count: 0 of 60 pills remain Pill/Patch Appearance: Markings consistent with prescribed medication Bottle Appearance: Standard pharmacy container. Clearly labeled. Filled Date: 08 / 23 / 2018 Last  Medication  intake:  Today   Pharmacokinetics: Liberation and absorption (onset of action): WNL Distribution (time to peak effect): WNL Metabolism and excretion (duration of action): WNL         Pharmacodynamics: Desired effects: Analgesia: Tanya [name] reports >50% benefit. Functional ability: Patient reports that medication allows her to accomplish basic ADLs Clinically meaningful improvement in function (CMIF): Sustained CMIF goals met Perceived effectiveness: Described as relatively effective, allowing for increase in activities of daily living (ADL) Undesirable effects: Side-effects or Adverse reactions: None reported Monitoring: [place] PMP: Online review of the past 11-monthperiod conducted. Compliant with practice rules and regulations List of all UDS test(s) done:  Lab Results  Component Value Date   SUMMARY FINAL 07/11/2016   SUMMARY FINAL 11/23/2015   Last UDS on record: Summary  Date Value Ref Range Status  07/11/2016 FINAL  Final    Comment:    ==================================================================== TOXASSURE SELECT 13 (MW) ==================================================================== Test                             Result       Flag       Units   NO DRUGS DETECTED. ==================================================================== Test                      Result    Flag   Units      Ref Range   Creatinine              26               mg/dL      >=20 ==================================================================== Declared Medications:  Medication list was not provided. ==================================================================== For clinical consultation, please call [phone] ====================================================================    UDS interpretation: Compliant          Medication Assessment Form: Reviewed. Patient indicates being compliant with therapy Treatment compliance: Compliant Risk Assessment  Profile: Aberrant behavior: See prior evaluations. None observed or detected today Comorbid factors increasing risk of overdose: See prior notes. No additional risks detected today Risk of substance use disorder (SUD): Low     Opioid Risk Tool - 10/08/16 1352      Family History of Substance Abuse   Alcohol Positive Female   Illegal Drugs Negative   Rx Drugs Negative     Personal History of Substance Abuse   Alcohol Negative   Illegal Drugs Negative     Age   Age between 137-45years  No     Psychological Disease   Psychological Disease Positive   ADD Negative   OCD Negative   Bipolar Negative   Schizophrenia Negative   Depression Positive     Total Score   Opioid Risk Tool Scoring 4   Opioid Risk Interpretation Moderate Risk     ORT Scoring interpretation table:  Score <3 = Low Risk for SUD  Score between 4-7 = Moderate Risk for SUD  Score >8 = High Risk for Opioid Abuse   Risk Mitigation Strategies:  Patient Counseling: Covered Patient-Prescriber Agreement (PPA): Present and active  Notification to other healthcare providers: Done  Pharmacologic Plan: No change in therapy, at this time  Laboratory Chemistry  Inflammation Markers (CRP: Acute Phase) (ESR: Chronic Phase) Lab Results  Component Value Date   CRP 6.0 (H) 07/11/2016   ESRSEDRATE 7 07/11/2016                 Renal Function Markers Lab Results  Component  Value Date   BUN 14 09/08/2016   CREATININE 0.85 09/08/2016   GFRAA >60 09/08/2016   GFRNONAA >60 09/08/2016                 Hepatic Function Markers Lab Results  Component Value Date   AST 21 09/08/2016   ALT 16 09/08/2016   ALBUMIN 4.4 09/08/2016   ALKPHOS 36 (L) 09/08/2016                 Electrolytes Lab Results  Component Value Date   NA 137 09/08/2016   K 3.4 (L) 09/08/2016   CL 102 09/08/2016   CALCIUM 9.2 09/08/2016   MG 2.2 07/11/2016                 Neuropathy Markers Lab Results  Component Value Date   VITAMINB12  310 07/11/2016                 Bone Pathology Markers Lab Results  Component Value Date   ALKPHOS 36 (L) 09/08/2016   25OHVITD1 9.6 (L) 07/11/2016   25OHVITD2 <1.0 07/11/2016   25OHVITD3 9.4 07/11/2016   CALCIUM 9.2 09/08/2016                 Coagulation Parameters Lab Results  Component Value Date   PLT 375 09/08/2016                 Cardiovascular Markers Lab Results  Component Value Date   HGB 13.4 09/08/2016   HCT 40.0 09/08/2016                 Note: Lab results reviewed.  Recent Diagnostic Imaging Review  Ct Renal Stone Study  Result Date: 09/08/2016 CLINICAL DATA:  Hematuria.  Abdominopelvic pain. EXAM: CT ABDOMEN AND PELVIS WITHOUT CONTRAST TECHNIQUE: Multidetector CT imaging of the abdomen and pelvis was performed following the standard protocol without IV contrast. COMPARISON:  None. FINDINGS: Lower chest: Motion artifact.  Scattered subsegmental atelectasis. Hepatobiliary: Scattered low-density lesions throughout the liver largest measuring 1.5 cm in the anterior right lobe. Larger lesions represent simple cysts, all smaller lesions are too small to characterize but likely also cysts. Gallbladder physiologically distended, no calcified stone. No biliary dilatation. Pancreas: No ductal dilatation or inflammation. Spleen: Normal in size without focal abnormality. Adrenals/Urinary Tract: No adrenal nodule. No perinephric edema. Prominence of both renal collecting systems likely secondary to distended urinary bladder. No bladder wall thickening. No urolithiasis. Stomach/Bowel: Tiny hiatal hernia. Stomach is otherwise unremarkable. No bowel obstruction, wall thickening or inflammation. Normal appendix. Moderate colonic stool burden with diffuse colonic diverticulosis, most prominent in the distal descending sigmoid colon. No acute diverticulitis. Vascular/Lymphatic: Aortic and branch atherosclerosis. No aneurysm. No adenopathy. Reproductive: Status post hysterectomy. No adnexal  masses. Other: No free air, free fluid, or intra-abdominal fluid collection. Musculoskeletal: There are no acute or suspicious osseous abnormalities. Multilevel facet arthropathy in the lumbar spine. Degenerative disc disease at L5-S1. IMPRESSION: 1. No renal stones or obstructive uropathy. No acute abnormality in the abdomen/pelvis. 2. Moderate diffuse colonic diverticulosis without acute inflammation. 3. Multiple hepatic cysts. 4.  Aortic Atherosclerosis (ICD10-I70.0). Electronically Signed   By: Jeb Levering M.D.   On: 09/08/2016 21:59   Note: Imaging results reviewed.          Meds   Current Outpatient Prescriptions:  .  Cholecalciferol (VITAMIN D3) 2000 units capsule, Take 1 capsule (2,000 Units total) by mouth daily., Disp: 30 capsule, Rfl: PRN .  [START ON 10/12/2016] cyclobenzaprine (  FLEXERIL) 10 MG tablet, Take 1 tablet (10 mg total) by mouth 3 (three) times daily as needed for muscle spasms., Disp: 90 tablet, Rfl: 2 .  [START ON 10/12/2016] gabapentin (NEURONTIN) 300 MG capsule, Take 1 capsule (300 mg total) by mouth 3 (three) times daily., Disp: 270 capsule, Rfl: 0 .  [START ON 10/12/2016] oxyCODONE (OXY IR/ROXICODONE) 5 MG immediate release tablet, Take 1 tablet (5 mg total) by mouth 2 (two) times daily., Disp: 60 tablet, Rfl: 0 .  DULoxetine (CYMBALTA) 30 MG capsule, TAKE 1 CAPSULE(30 MG) BY MOUTH EVERY DAY, Disp: , Rfl:  .  [START ON 11/11/2016] oxyCODONE (OXY IR/ROXICODONE) 5 MG immediate release tablet, Take 1 tablet (5 mg total) by mouth 2 (two) times daily., Disp: 60 tablet, Rfl: 0 .  [START ON 12/11/2016] oxyCODONE (OXY IR/ROXICODONE) 5 MG immediate release tablet, Take 1 tablet (5 mg total) by mouth 2 (two) times daily., Disp: 60 tablet, Rfl: 0  ROS  Constitutional: Denies any fever or chills Gastrointestinal: No reported hemesis, hematochezia, vomiting, or acute GI distress Musculoskeletal: Denies any acute onset joint swelling, redness, loss of ROM, or weakness Neurological:  No reported episodes of acute onset apraxia, aphasia, dysarthria, agnosia, amnesia, paralysis, loss of coordination, or loss of consciousness  Allergies  Tanya [name] is allergic to trazodone; bupropion; methocarbamol; oxycodone-acetaminophen; tramadol; and trazodone and nefazodone.  [place]  Drug: Tanya [name]  reports that she does not use drugs. Alcohol:  reports that she does not drink alcohol. Tobacco:  reports that she has been smoking Cigarettes.  She has been smoking about 0.50 packs per day. She has never used smokeless tobacco. Medical:  has a past medical history of Anxiety; Bladder infection; Depression; Hyperlipidemia; Lupus; and Overactive bladder. Surgical: Tanya [name]  has a past surgical history that includes Abdominal hysterectomy. Family: family history includes Cancer in her mother; Diabetes in her brother; Emphysema in her mother; Glaucoma in her father; Heart disease in her father; Hypertension in her mother; Stroke in her mother.  Constitutional Exam  General appearance: Well nourished, well developed, and well hydrated. In no apparent acute distress Vitals:   10/08/16 1344  BP: 126/73  Pulse: 93  Resp: 16  Temp: 98.3 F (36.8 C)  SpO2: 99%  Weight: 142 lb 12.8 oz (64.8 kg)  Height: 5' 3"  (1.6 m)   BMI Assessment: Estimated body mass index is 25.3 kg/m as calculated from the following:   Height as of this encounter: 5' 3"  (1.6 m).   Weight as of this encounter: 142 lb 12.8 oz (64.8 kg). Psych/Mental status: Alert, oriented x 3 (person, place, & time)       Eyes: PERLA Respiratory: No evidence of acute respiratory distress  Cervical Spine Area Exam  Skin & Axial Inspection: No masses, redness, edema, swelling, or associated skin lesions Alignment: Symmetrical Functional ROM: Unrestricted ROM      Stability: No instability detected Muscle Tone/Strength: Functionally intact. No obvious neuro-muscular anomalies detected. Sensory (Neurological):  Unimpaired Palpation: Complains of area being tender to palpation              Upper Extremity (UE) Exam    Side: Right upper extremity  Side: Left upper extremity  Skin & Extremity Inspection: Skin color, temperature, and hair growth are WNL. No peripheral edema or cyanosis. No masses, redness, swelling, asymmetry, or associated skin lesions. No contractures.  Skin & Extremity Inspection: Skin color, temperature, and hair growth are WNL. No peripheral edema or cyanosis. No masses, redness, swelling, asymmetry, or  associated skin lesions. No contractures.  Functional ROM: Unrestricted ROM          Functional ROM: Unrestricted ROM          Muscle Tone/Strength: Functionally intact. No obvious neuro-muscular anomalies detected.  Muscle Tone/Strength: Functionally intact. No obvious neuro-muscular anomalies detected.  Sensory (Neurological): Unimpaired          Sensory (Neurological): Unimpaired          Palpation: No palpable anomalies              Palpation: No palpable anomalies              Specialized Test(s): Deferred         Specialized Test(s): Deferred          Thoracic Spine Area Exam  Skin & Axial Inspection: No masses, redness, or swelling Alignment: Symmetrical Functional ROM: Unrestricted ROM Stability: No instability detected Muscle Tone/Strength: Functionally intact. No obvious neuro-muscular anomalies detected. Sensory (Neurological): Unimpaired Muscle strength & Tone: No palpable anomalies  Assessment  Primary Diagnosis & Pertinent Problem List: The primary encounter diagnosis was Chronic neck pain (Location of Primary Source of Pain) (Bilateral) (R>L). Diagnoses of Chronic cervical radicular pain (Right), Cervical spondylosis, Cervical facet syndrome (Bilateral) (R>L), Musculoskeletal pain, Fibromyalgia, and Chronic pain syndrome were also pertinent to this visit.  Status Diagnosis  Controlled Controlled Controlled 1. Chronic neck pain (Location of Primary Source of Pain)  (Bilateral) (R>L)   2. Chronic cervical radicular pain (Right)   3. Cervical spondylosis   4. Cervical facet syndrome (Bilateral) (R>L)   5. Musculoskeletal pain   6. Fibromyalgia   7. Chronic pain syndrome     Problems updated and reviewed during this visit: No problems updated. Plan of Care  Pharmacotherapy (Medications Ordered): Meds ordered this encounter  Medications  . oxyCODONE (OXY IR/ROXICODONE) 5 MG immediate release tablet    Sig: Take 1 tablet (5 mg total) by mouth 2 (two) times daily.    Dispense:  60 tablet    Refill:  0    Do not place this medication, or any other prescription from our practice, on "Automatic Refill". Patient may have prescription filled one day early if pharmacy is closed on scheduled refill date. Do not fill until: 10/12/2016 To last until:11/11/2016    Order Specific Question:   Supervising Provider    Answer:   Milinda Pointer [phone]  . oxyCODONE (OXY IR/ROXICODONE) 5 MG immediate release tablet    Sig: Take 1 tablet (5 mg total) by mouth 2 (two) times daily.    Dispense:  60 tablet    Refill:  0    Do not place this medication, or any other prescription from our practice, on "Automatic Refill". Patient may have prescription filled one day early if pharmacy is closed on scheduled refill date. Do not fill until: 11/11/2016 To last until:12/11/2016    Order Specific Question:   Supervising Provider    Answer:   Milinda Pointer [phone]  . oxyCODONE (OXY IR/ROXICODONE) 5 MG immediate release tablet    Sig: Take 1 tablet (5 mg total) by mouth 2 (two) times daily.    Dispense:  60 tablet    Refill:  0    Do not place this medication, or any other prescription from our practice, on "Automatic Refill". Patient may have prescription filled one day early if pharmacy is closed on scheduled refill date. Do not fill until: 12/11/2016 To last until: 01/10/2017    Order Specific Question:  Supervising Provider    Answer:   Milinda Pointer  [phone]  . cyclobenzaprine (FLEXERIL) 10 MG tablet    Sig: Take 1 tablet (10 mg total) by mouth 3 (three) times daily as needed for muscle spasms.    Dispense:  90 tablet    Refill:  2    Do not add this medication to the electronic "Automatic Refill" notification system. Patient may have prescription filled one day early if pharmacy is closed on scheduled refill date.    Order Specific Question:   Supervising Provider    Answer:   Milinda Pointer [phone]  . gabapentin (NEURONTIN) 300 MG capsule    Sig: Take 1 capsule (300 mg total) by mouth 3 (three) times daily.    Dispense:  270 capsule    Refill:  0    Do not add this medication to the electronic "Automatic Refill" notification system. Patient may have prescription filled one day early if pharmacy is closed on scheduled refill date.    Order Specific Question:   Supervising Provider    Answer:   Milinda Pointer [[id number]]   New Prescriptions   No medications on file   Medications administered today: Tanya [name] had no medications administered during this visit. Lab-work, procedure(s), and/or referral(s): No orders of the defined types were placed in this encounter.  Imaging and/or referral(s): None  Interventional therapies: Planned, scheduled, and/or pending:   PT would like to have the left cervical facet RFA repeated instead of having the right RFA   Considering:   Diagnostic bilateral intra-articular shoulder joint injection Diagnostic bilateral suprascapular nerve block Possible bilateral suprascapular nerve RFA Right-sided cervical epidural steroid injection(Series #2) Diagnostic bilateral cervical facet block Possible bilateral cervical facet radiofrequencyablation  Diagnostic bilateral lumbar facet block Possible bilateral lumbar facet radiofrequency ablation    Palliative PRN treatment(s):   Right-sided cervical epidural steroid injection(Series #2)   Provider-requested follow-up: Return in  about 3 months (around 01/07/2017) for MedMgmt.  Future Appointments Date Time Provider [place]  12/24/2016 10:30 AM Milinda Pointer, MD [facility] None  01/07/2017 1:45 PM Vevelyn Francois, NP [facility] None   Primary Care Physician: Ricardo Jericho, NP Location: [facility] Outpatient Pain Management Facility Note by: Vevelyn Francois NP Date: 10/08/2016; Time: 4:02 PM  Pain Score Disclaimer: We use the NRS-11 scale. This is a self-reported, subjective measurement of pain severity with only modest accuracy. It is used primarily to identify changes within a particular patient. It must be understood that outpatient pain scales are significantly less accurate that those used for research, where they can be applied under ideal controlled circumstances with minimal exposure to variables. In reality, the score is likely to be a combination of pain intensity and pain affect, where pain affect describes the degree of emotional arousal or changes in action readiness caused by the sensory experience of pain. Factors such as social and work situation, setting, emotional state, anxiety levels, expectation, and prior pain experience may influence pain perception and show large inter-individual differences that may also be affected by time variables.  Patient instructions provided during this appointment: Patient Instructions    ____________________________________________________________________________________________  Medication Rules  Applies to: All patients receiving prescriptions (written or electronic).  Pharmacy of record: Pharmacy where electronic prescriptions will be sent. If written prescriptions are taken to a different pharmacy, please inform the nursing staff. The pharmacy listed in the electronic medical record should be the one where you would like electronic prescriptions to be sent.  Prescription refills: Only  during scheduled appointments. Applies to both, written and  electronic prescriptions.  NOTE: The following applies primarily to controlled substances (Opioid* Pain Medications).   Patient's responsibilities: 1. Pain Pills: Bring all pain pills to every appointment (except for procedure appointments). 2. Pill Bottles: Bring pills in original pharmacy bottle. Always bring newest bottle. Bring bottle, even if empty. 3. Medication refills: You are responsible for knowing and keeping track of what medications you need refilled. The day before your appointment, write a list of all prescriptions that need to be refilled. Bring that list to your appointment and give it to the admitting nurse. Prescriptions will be written only during appointments. If you forget a medication, it will not be "Called in", "Faxed", or "electronically sent". You will need to get another appointment to get these prescribed. 4. Prescription Accuracy: You are responsible for carefully inspecting your prescriptions before leaving our office. Have the discharge nurse carefully go over each prescription with you, before taking them home. Make sure that your name is accurately spelled, that your address is correct. Check the name and dose of your medication to make sure it is accurate. Check the number of pills, and the written instructions to make sure they are clear and accurate. Make sure that you are given enough medication to last until your next medication refill appointment. 5. Taking Medication: Take medication as prescribed. Never take more pills than instructed. Never take medication more frequently than prescribed. Taking less pills or less frequently is permitted and encouraged, when it comes to controlled substances (written prescriptions).  6. Inform other Doctors: Always inform, all of your healthcare providers, of all the medications you take. 7. Pain Medication from other Providers: You are not allowed to accept any additional pain medication from any other Doctor or Healthcare  provider. There are two exceptions to this rule. (see below) In the event that you require additional pain medication, you are responsible for notifying us, as stated below. 8. Medication Agreement: You are responsible for carefully reading and following our Medication Agreement. This must be signed before receiving any prescriptions from our practice. Safely store a copy of your signed Agreement. Violations to the Agreement will result in no further prescriptions. (Additional copies of our Medication Agreement are available upon request.) 9. Laws, Rules, & Regulations: All patients are expected to follow all Federal and [employer], Rules, [employer]. Ignorance of the Laws does not constitute a valid excuse. The use of any illegal substances is prohibited. 10. Adopted CDC guidelines & recommendations: Target dosing levels will be at or below 60 MME/day. Use of benzodiazepines** is not recommended.  Exceptions: There are only two exceptions to the rule of not receiving pain medications from other Healthcare Providers. 1. Exception #1 (Emergencies): In the event of an emergency (i.e.: accident requiring emergency care), you are allowed to receive additional pain medication. However, you are responsible for: As soon as you are able, call our office [phone], at any time of the day or night, and leave a message stating your name, the date and nature of the emergency, and the name and dose of the medication prescribed. In the event that your call is answered by a member of our staff, make sure to document and save the date, time, and the name of the person that took your information.  2. Exception #2 (Planned Surgery): In the event that you are scheduled by another doctor or dentist to have any type of surgery or procedure, you are allowed (for  a period no longer than 30 days), to receive additional pain medication, for the acute post-op pain. However, in this case, you are responsible for  picking up a copy of our "Post-op Pain Management for Surgeons" handout, and giving it to your surgeon or dentist. This document is available at our office, and does not require an appointment to obtain it. Simply go to our office during business hours (Monday-Thursday from 8:00 AM to 4:00 PM) (Friday 8:00 AM to 12:00 Noon) or if you have a scheduled appointment with [place], prior to your surgery, and ask for it by name. In addition, you will need to provide [place] with your name, name of your surgeon, type of surgery, and date of procedure or surgery.  *Opioid medications include: morphine, codeine, oxycodone, oxymorphone, hydrocodone, hydromorphone, meperidine, tramadol, tapentadol, buprenorphine, fentanyl, methadone. **Benzodiazepine medications include: diazepam (Valium), alprazolam (Xanax), clonazepam (Klonopine), lorazepam (Ativan), clorazepate (Tranxene), chlordiazepoxide (Librium), estazolam (Prosom), oxazepam (Serax), temazepam (Restoril), triazolam (Halcion)  ____________________________________________________________________________________________  BMI Assessment: Estimated body mass index is 25.3 kg/m as calculated from the following:   Height as of this encounter: 5' 3"  (1.6 m).   Weight as of this encounter: 142 lb 12.8 oz (64.8 kg).  BMI interpretation table: BMI level Category Range association with higher incidence of chronic pain  <18 kg/m2 Underweight   18.5-24.9 kg/m2 Ideal body weight   25-29.9 kg/m2 Overweight Increased incidence by 20%  30-34.9 kg/m2 Obese (Class I) Increased incidence by 68%  35-39.9 kg/m2 Severe obesity (Class II) Increased incidence by 136%  >40 kg/m2 Extreme obesity (Class III) Increased incidence by 254%   BMI Readings from Last 4 Encounters:  10/08/16 25.30 kg/m  09/08/16 25.69 kg/m  07/11/16 26.57 kg/m  06/26/16 26.57 kg/m   Wt Readings from Last 4 Encounters:  10/08/16 142 lb 12.8 oz (64.8 kg)  09/08/16 145 lb (65.8 kg)  07/11/16 150 lb (68  kg)  06/26/16 150 lb (68 kg)

## 2016-10-08 NOTE — Progress Notes (Signed)
Nursing Pain Medication Assessment:  Safety precautions to be maintained throughout the outpatient stay will include: orient to surroundings, keep bed in low position, maintain call bell within reach at all times, provide assistance with transfer out of bed and ambulation.  Medication Inspection Compliance: Pill count conducted under aseptic conditions, in front of the patient. Neither the pills nor the bottle was removed from the patient's sight at any time. Once count was completed pills were immediately returned to the patient in their original bottle.  Medication: See above Pill/Patch Count: 0 of 60 pills remain Pill/Patch Appearance: Markings consistent with prescribed medication Bottle Appearance: Standard pharmacy container. Clearly labeled. Filled Date: 08 / 23 / 2018 Last Medication intake:  Today

## 2016-10-08 NOTE — Patient Instructions (Addendum)
____________________________________________________________________________________________  Medication Rules  Applies to: All patients receiving prescriptions (written or electronic).  Pharmacy of record: Pharmacy where electronic prescriptions will be sent. If written prescriptions are taken to a different pharmacy, please inform the nursing staff. The pharmacy listed in the electronic medical record should be the one where you would like electronic prescriptions to be sent.  Prescription refills: Only during scheduled appointments. Applies to both, written and electronic prescriptions.  NOTE: The following applies primarily to controlled substances (Opioid* Pain Medications).   Patient's responsibilities: 1. Pain Pills: Bring all pain pills to every appointment (except for procedure appointments). 2. Pill Bottles: Bring pills in original pharmacy bottle. Always bring newest bottle. Bring bottle, even if empty. 3. Medication refills: You are responsible for knowing and keeping track of what medications you need refilled. The day before your appointment, write a list of all prescriptions that need to be refilled. Bring that list to your appointment and give it to the admitting nurse. Prescriptions will be written only during appointments. If you forget a medication, it will not be "Called in", "Faxed", or "electronically sent". You will need to get another appointment to get these prescribed. 4. Prescription Accuracy: You are responsible for carefully inspecting your prescriptions before leaving our office. Have the discharge nurse carefully go over each prescription with you, before taking them home. Make sure that your name is accurately spelled, that your address is correct. Check the name and dose of your medication to make sure it is accurate. Check the number of pills, and the written instructions to make sure they are clear and accurate. Make sure that you are given enough medication to  last until your next medication refill appointment. 5. Taking Medication: Take medication as prescribed. Never take more pills than instructed. Never take medication more frequently than prescribed. Taking less pills or less frequently is permitted and encouraged, when it comes to controlled substances (written prescriptions).  6. Inform other Doctors: Always inform, all of your healthcare providers, of all the medications you take. 7. Pain Medication from other Providers: You are not allowed to accept any additional pain medication from any other Doctor or Healthcare provider. There are two exceptions to this rule. (see below) In the event that you require additional pain medication, you are responsible for notifying us, as stated below. 8. Medication Agreement: You are responsible for carefully reading and following our Medication Agreement. This must be signed before receiving any prescriptions from our practice. Safely store a copy of your signed Agreement. Violations to the Agreement will result in no further prescriptions. (Additional copies of our Medication Agreement are available upon request.) 9. Laws, Rules, & Regulations: All patients are expected to follow all Federal and State Laws, Statutes, Rules, & Regulations. Ignorance of the Laws does not constitute a valid excuse. The use of any illegal substances is prohibited. 10. Adopted CDC guidelines & recommendations: Target dosing levels will be at or below 60 MME/day. Use of benzodiazepines** is not recommended.  Exceptions: There are only two exceptions to the rule of not receiving pain medications from other Healthcare Providers. 1. Exception #1 (Emergencies): In the event of an emergency (i.e.: accident requiring emergency care), you are allowed to receive additional pain medication. However, you are responsible for: As soon as you are able, call our office [phone], at any time of the day or night, and leave a message stating your  name, the date and nature of the emergency, and the name and dose of the medication   prescribed. In the event that your call is answered by a member of our staff, make sure to document and save the date, time, and the name of the person that took your information.  2. Exception #2 (Planned Surgery): In the event that you are scheduled by another doctor or dentist to have any type of surgery or procedure, you are allowed (for a period no longer than 30 days), to receive additional pain medication, for the acute post-op pain. However, in this case, you are responsible for picking up a copy of our "Post-op Pain Management for Surgeons" handout, and giving it to your surgeon or dentist. This document is available at our office, and does not require an appointment to obtain it. Simply go to our office during business hours (Monday-Thursday from 8:00 AM to 4:00 PM) (Friday 8:00 AM to 12:00 Noon) or if you have a scheduled appointment with [place], prior to your surgery, and ask for it by name. In addition, you will need to provide [place] with your name, name of your surgeon, type of surgery, and date of procedure or surgery.  *Opioid medications include: morphine, codeine, oxycodone, oxymorphone, hydrocodone, hydromorphone, meperidine, tramadol, tapentadol, buprenorphine, fentanyl, methadone. **Benzodiazepine medications include: diazepam (Valium), alprazolam (Xanax), clonazepam (Klonopine), lorazepam (Ativan), clorazepate (Tranxene), chlordiazepoxide (Librium), estazolam (Prosom), oxazepam (Serax), temazepam (Restoril), triazolam (Halcion)  ____________________________________________________________________________________________  BMI Assessment: Estimated body mass index is 25.3 kg/m as calculated from the following:   Height as of this encounter: 5\' 3"  (1.6 m).   Weight as of this encounter: 142 lb 12.8 oz (64.8 kg).  BMI interpretation table: BMI level Category Range association with higher incidence of chronic  pain  <18 kg/m2 Underweight   18.5-24.9 kg/m2 Ideal body weight   25-29.9 kg/m2 Overweight Increased incidence by 20%  30-34.9 kg/m2 Obese (Class I) Increased incidence by 68%  35-39.9 kg/m2 Severe obesity (Class II) Increased incidence by 136%  >40 kg/m2 Extreme obesity (Class III) Increased incidence by 254%   BMI Readings from Last 4 Encounters:  10/08/16 25.30 kg/m  09/08/16 25.69 kg/m  07/11/16 26.57 kg/m  06/26/16 26.57 kg/m   Wt Readings from Last 4 Encounters:  10/08/16 142 lb 12.8 oz (64.8 kg)  09/08/16 145 lb (65.8 kg)  07/11/16 150 lb (68 kg)  06/26/16 150 lb (68 kg)

## 2016-12-24 ENCOUNTER — Ambulatory Visit: Payer: Medicaid Other | Admitting: Pain Medicine

## 2016-12-24 ENCOUNTER — Encounter: Payer: Self-pay | Admitting: Pain Medicine

## 2016-12-24 ENCOUNTER — Ambulatory Visit
Admission: RE | Admit: 2016-12-24 | Discharge: 2016-12-24 | Disposition: A | Discharge status: Home / Self Care | Payer: Medicaid Other | Source: Ambulatory Visit | Attending: Pain Medicine | Admitting: Pain Medicine

## 2016-12-24 ENCOUNTER — Other Ambulatory Visit: Payer: Self-pay

## 2016-12-24 VITALS — BP 111/74 | HR 59 | Temp 97.8°F | Resp 20 | Ht 63.0 in | Wt 135.0 lb

## 2016-12-24 DIAGNOSIS — Z885 Allergy status to narcotic agent status: Secondary | ICD-10-CM | POA: Diagnosis not present

## 2016-12-24 DIAGNOSIS — M542 Cervicalgia: Secondary | ICD-10-CM | POA: Insufficient documentation

## 2016-12-24 DIAGNOSIS — G8929 Other chronic pain: Secondary | ICD-10-CM | POA: Diagnosis not present

## 2016-12-24 DIAGNOSIS — Z888 Allergy status to other drugs, medicaments and biological substances status: Secondary | ICD-10-CM | POA: Insufficient documentation

## 2016-12-24 DIAGNOSIS — Z90710 Acquired absence of both cervix and uterus: Secondary | ICD-10-CM | POA: Diagnosis not present

## 2016-12-24 DIAGNOSIS — F419 Anxiety disorder, unspecified: Secondary | ICD-10-CM | POA: Diagnosis not present

## 2016-12-24 DIAGNOSIS — G8918 Other acute postprocedural pain: Secondary | ICD-10-CM | POA: Diagnosis not present

## 2016-12-24 DIAGNOSIS — M488X2 Other specified spondylopathies, cervical region: Secondary | ICD-10-CM | POA: Diagnosis not present

## 2016-12-24 DIAGNOSIS — M47812 Spondylosis without myelopathy or radiculopathy, cervical region: Secondary | ICD-10-CM | POA: Insufficient documentation

## 2016-12-24 DIAGNOSIS — Z79891 Long term (current) use of opiate analgesic: Secondary | ICD-10-CM | POA: Insufficient documentation

## 2016-12-24 DIAGNOSIS — Z79899 Other long term (current) drug therapy: Secondary | ICD-10-CM | POA: Diagnosis not present

## 2016-12-24 MED ORDER — HYDROCODONE-ACETAMINOPHEN 5-325 MG PO TABS: 1.0000 | ORAL_TABLET | Freq: Three times a day (TID) | ORAL | 0 refills | Status: AC | PRN

## 2016-12-24 MED ORDER — LACTATED RINGERS IV SOLN
1000.0000 mL | Freq: Once | INTRAVENOUS | Status: AC
  Administered 2016-12-24: 1000 mL via INTRAVENOUS

## 2016-12-24 MED ORDER — ROPIVACAINE HCL 2 MG/ML IJ SOLN
9.0000 mL | Freq: Once | INTRAMUSCULAR | Status: AC
  Administered 2016-12-24: 10 mL via PERINEURAL
  Filled 2016-12-24: qty 10

## 2016-12-24 MED ORDER — FENTANYL CITRATE (PF) 100 MCG/2ML IJ SOLN
25.0000 ug | INTRAMUSCULAR | Status: DC | PRN
Start: 2016-12-24 — End: 2016-12-24
  Administered 2016-12-24: 100 ug via INTRAVENOUS
  Filled 2016-12-24: qty 2

## 2016-12-24 MED ORDER — LIDOCAINE HCL 2 % IJ SOLN
10.0000 mL | Freq: Once | INTRAMUSCULAR | Status: AC
  Administered 2016-12-24: 400 mg
  Filled 2016-12-24: qty 20

## 2016-12-24 MED ORDER — MIDAZOLAM HCL 5 MG/5ML IJ SOLN
1.0000 mg | INTRAMUSCULAR | Status: DC | PRN
  Administered 2016-12-24: 3 mg via INTRAVENOUS
  Filled 2016-12-24: qty 5

## 2016-12-24 MED ORDER — DEXAMETHASONE SODIUM PHOSPHATE 10 MG/ML IJ SOLN
10.0000 mg | Freq: Once | INTRAMUSCULAR | Status: AC
  Administered 2016-12-24: 10 mg
  Filled 2016-12-24: qty 1

## 2016-12-24 NOTE — Progress Notes (Signed)
Patient's Name: Tanya [name]  MRN: [id number]  Referring Provider: Ricardo Jericho*  DOB: May 18, 1960  PCP: Ricardo Jericho, NP  DOS: 12/24/2016  Note by: Gaspar Cola, MD  Service setting: Ambulatory outpatient  Specialty: Interventional Pain Management  Patient type: Established  Location: [facility] (AMB) Pain Management Facility  Visit type: Interventional Procedure   Primary Reason for Visit: Interventional Pain Management Treatment. CC: Neck Pain (lower)  Procedure:  Anesthesia, Analgesia, Anxiolysis:  Type: Therapeutic Medial Branch Facet Radiofrequency Ablation Region: Posterolateral Cervical Level: C3, C4, C5, C6, & C7 Medial Branch Level(s) Laterality: Right-Sided Paraspinal  The left-sided radiofrequency was done on 06/26/2016  Type: Local Anesthesia with Moderate (Conscious) Sedation Local Anesthetic: Lidocaine 1% Route: Intravenous (IV) IV Access: Secured Sedation: Meaningful verbal contact was maintained at all times during the procedure  Indication(s): Analgesia and Anxiety   Indications: 1. Cervical facet syndrome (Bilateral) (R>L)   2. Chronic neck pain (Location of Primary Source of Pain) (Bilateral) (R>L)   3. Cervical spondylosis   4. Acute postoperative pain    Tanya [name] has either failed to respond, was unable to tolerate, or simply did not get enough benefit from other more conservative therapies including, but not limited to: 1. Over-the-counter medications 2. Anti-inflammatory medications 3. Muscle relaxants 4. Membrane stabilizers 5. Opioids 6. Physical therapy 7. Modalities (Heat, ice, etc.) 8. Invasive techniques such as nerve blocks. Tanya [name] has attained more than 50% relief of the pain from a series of diagnostic injections conducted in separate occasions.  Pain Score: Pre-procedure: 8 /10 Post-procedure: 0-No pain/10  Pre-op Assessment:  Tanya [name] is a 56 y.o. (year old), female patient, seen today for  interventional treatment. She  has a past surgical history that includes Abdominal hysterectomy. Tanya [name] has a current medication list which includes the following prescription(s): vitamin d3, cyclobenzaprine, gabapentin, oxycodone, duloxetine, hydrocodone-acetaminophen, oxycodone, and oxycodone, and the following Facility-Administered Medications: fentanyl and midazolam. Her primarily concern today is the Neck Pain (lower)  Initial Vital Signs: There were no vitals taken for this visit. BMI: Estimated body mass index is 23.91 kg/m as calculated from the following:   Height as of this encounter: 5\' 3"  (1.6 m).   Weight as of this encounter: 135 lb (61.2 kg).  Risk Assessment: Allergies: Reviewed. She is allergic to trazodone; bupropion; methocarbamol; oxycodone-acetaminophen; tramadol; and trazodone and nefazodone.  Allergy Precautions: None required Coagulopathies: Reviewed. None identified.  Blood-thinner therapy: None at this time Active Infection(s): Reviewed. None identified. Tanya [name] is afebrile  Site Confirmation: Tanya [name] was asked to confirm the procedure and laterality before marking the site Procedure checklist: Completed Consent: Before the procedure and under the influence of no sedative(s), amnesic(s), or anxiolytics, the patient was informed of the treatment options, risks and possible complications. To fulfill our ethical and legal obligations, as recommended by the American Medical Association's Code of Ethics, I have informed the patient of my clinical impression; the nature and purpose of the treatment or procedure; the risks, benefits, and possible complications of the intervention; the alternatives, including doing nothing; the risk(s) and benefit(s) of the alternative treatment(s) or procedure(s); and the risk(s) and benefit(s) of doing nothing. The patient was provided information about the general risks and possible complications associated with the  procedure. These may include, but are not limited to: failure to achieve desired goals, infection, bleeding, organ or nerve damage, allergic reactions, paralysis, and death. In addition, the patient was informed of those risks and complications associated to Spine-related procedures, such  as failure to decrease pain; infection (i.e.: Meningitis, epidural or intraspinal abscess); bleeding (i.e.: epidural hematoma, subarachnoid hemorrhage, or any other type of intraspinal or peri-dural bleeding); organ or nerve damage (i.e.: Any type of peripheral nerve, nerve root, or spinal cord injury) with subsequent damage to sensory, motor, and/or autonomic systems, resulting in permanent pain, numbness, and/or weakness of one or several areas of the body; allergic reactions; (i.e.: anaphylactic reaction); and/or death. Furthermore, the patient was informed of those risks and complications associated with the medications. These include, but are not limited to: allergic reactions (i.e.: anaphylactic or anaphylactoid reaction(s)); adrenal axis suppression; blood sugar elevation that in diabetics may result in ketoacidosis or comma; water retention that in patients with history of congestive heart failure may result in shortness of breath, pulmonary edema, and decompensation with resultant heart failure; weight gain; swelling or edema; medication-induced neural toxicity; particulate matter embolism and blood vessel occlusion with resultant organ, and/or nervous system infarction; and/or aseptic necrosis of one or more joints. Finally, the patient was informed that Medicine is not an exact science; therefore, there is also the possibility of unforeseen or unpredictable risks and/or possible complications that may result in a catastrophic outcome. The patient indicated having understood very clearly. We have given the patient no guarantees and we have made no promises. Enough time was given to the patient to ask questions, all of  which were answered to the patient's satisfaction. Ms. Verbeke has indicated that she wanted to continue with the procedure. Attestation: I, the ordering provider, attest that I have discussed with the patient the benefits, risks, side-effects, alternatives, likelihood of achieving goals, and potential problems during recovery for the procedure that I have provided informed consent. Date: 12/24/2016; Time: 7:33 AM  Pre-Procedure Preparation:  Monitoring: As per clinic protocol. Respiration, ETCO2, SpO2, BP, heart rate and rhythm monitor placed and checked for adequate function Safety Precautions: Patient was assessed for positional comfort and pressure points before starting the procedure. Time-out: I initiated and conducted the "Time-out" before starting the procedure, as per protocol. The patient was asked to participate by confirming the accuracy of the "Time Out" information. Verification of the correct person, site, and procedure were performed and confirmed by me, the nursing staff, and the patient. "Time-out" conducted as per Joint Commission's Universal Protocol (UP.01.01.01). "Time-out" Date & Time: 12/24/2016; 1233 hrs.  Description of Procedure Process:   Position: Prone with head of the table was raised to facilitate breathing. Target Area: For Cervical Facet block(s), the target is the postero-lateral waist of the articular pillars at the C3, C4, C5, C6, & C7 levels. Approach: Posterior approach. Area Prepped: Entire Posterior Cervical Region Prepping solution: Hibiclens (4.0% Chlorhexidine gluconate solution) Safety Precautions: Aspiration looking for blood return was conducted prior to all injections. At no point did we inject any substances, as a needle was being advanced. Safe injection practices and needle disposal techniques used. Medications properly checked for expiration dates. SDV (single dose vial) medications used. Description of the Procedure: Protocol guidelines were  followed. The patient was placed in position over the procedure table. The target area was identified and the area prepped in the usual manner. The skin and muscle were infiltrated with local anesthetic. Appropriate amount of time allowed to pass for local anesthetics to take effect. Radiofrequency needles were introduced to the target area using fluoroscopic guidance. Using the NeuroTherm NT1100 Radiofrequency Generator, sensory stimulation using 50 Hz was used to locate & identify the nerve, making sure that the needle was positioned  such that there was no sensory stimulation below 0.3 V or above 0.7 V. Stimulation using 2 Hz was used to evaluate the motor component. Care was taken not to lesion any nerves that demonstrated motor stimulation of the lower extremities at an output of less than 2.5 times that of the sensory threshold, or a maximum of 2.0 V. Once satisfactory placement of the needles was achieved, the numbing solution was slowly injected after negative aspiration. After waiting for at least 2 minutes, the ablation was performed at 80 degrees C for 60 seconds, using regular Radiofrequency settings. Once the procedure was completed, the needles were then removed and the area cleansed, making sure to leave some of the prepping solution back to take advantage of its long term bactericidal properties. Intra-operative Compliance: Compliant Vitals:   12/24/16 1258 12/24/16 1308 12/24/16 1313 12/24/16 1323  BP:  109/66 108/62 111/74  Pulse: 72 60 (!) 53 (!) 59  Resp: 10 17 11 20   Temp:    97.8 F (36.6 C)  SpO2: 95% 97% 95% 100%  Weight:      Height:        Start Time: 1234 hrs. End Time: 1257 hrs. Materials & Medications:  Needle(s) Type: Teflon-coated, curved tip, Radiofrequency needle(s) Gauge: 22G Length: 10cm Medication(s): We administered lactated ringers, midazolam, fentaNYL, lidocaine, ropivacaine (PF) 2 mg/mL (0.2%), and dexamethasone. Please see chart orders for dosing  details.  Imaging Guidance (Spinal):  Type of Imaging Technique: Fluoroscopy Guidance (Spinal) Indication(s): Assistance in needle guidance and placement for procedures requiring needle placement in or near specific anatomical locations not easily accessible without such assistance. Exposure Time: Please see nurses notes. Contrast: None used. Fluoroscopic Guidance: I was personally present during the use of fluoroscopy. "Tunnel Vision Technique" used to obtain the best possible view of the target area. Parallax error corrected before commencing the procedure. "Direction-depth-direction" technique used to introduce the needle under continuous pulsed fluoroscopy. Once target was reached, antero-posterior, oblique, and lateral fluoroscopic projection used confirm needle placement in all planes. Images permanently stored in EMR. Interpretation: No contrast injected. I personally interpreted the imaging intraoperatively. Adequate needle placement confirmed in multiple planes. Permanent images saved into the patient's record.  Antibiotic Prophylaxis:  Indication(s): None identified Antibiotic given: None  Post-operative Assessment:  EBL: None Complications: No immediate post-treatment complications observed by team, or reported by patient. Note: The patient tolerated the entire procedure well. A repeat set of vitals were taken after the procedure and the patient was kept under observation following institutional policy, for this type of procedure. Post-procedural neurological assessment was performed, showing return to baseline, prior to discharge. The patient was provided with post-procedure discharge instructions, including a section on how to identify potential problems. Should any problems arise concerning this procedure, the patient was given instructions to immediately contact us, at any time, without hesitation. In any case, we plan to contact the patient by telephone for a follow-up status report  regarding this interventional procedure. Comments:  No additional relevant information.  Plan of Care    Imaging Orders     DG C-Arm 1-60 Min-No Report  Procedure Orders     Radiofrequency,Cervical  Medications ordered for procedure: Meds ordered this encounter  Medications  . lactated ringers infusion 1,000 mL  . midazolam (VERSED) 5 MG/5ML injection 1-2 mg    Make sure Flumazenil is available in the pyxis when using this medication. If oversedation occurs, administer 0.2 mg IV over 15 sec. If after 45 sec no response, administer 0.2  mg again over 1 min; may repeat at 1 min intervals; not to exceed 4 doses (1 mg)  . fentaNYL (SUBLIMAZE) injection 25-50 mcg    Make sure Narcan is available in the pyxis when using this medication. In the event of respiratory depression (RR< 8/min): Titrate NARCAN (naloxone) in increments of 0.1 to 0.2 mg IV at 2-3 minute intervals, until desired degree of reversal.  . lidocaine (XYLOCAINE) 2 % (with pres) injection 200 mg  . ropivacaine (PF) 2 mg/mL (0.2%) (NAROPIN) injection 9 mL  . dexamethasone (DECADRON) injection 10 mg  . HYDROcodone-acetaminophen (NORCO/VICODIN) 5-325 MG tablet    Sig: Take 1 tablet by mouth every 8 (eight) hours as needed for up to 7 days for severe pain.    Dispense:  21 tablet    Refill:  0    For acute post-operative pain. Not to be refilled. To last 7 days.   Medications administered: We administered lactated ringers, midazolam, fentaNYL, lidocaine, ropivacaine (PF) 2 mg/mL (0.2%), and dexamethasone.  See the medical record for exact dosing, route, and time of administration.  This SmartLink is deprecated. Use AVSMEDLIST instead to display the medication list for a patient. Disposition: Discharge home  Discharge Date & Time: 12/24/2016; 1325 hrs.   Physician-requested Follow-up: Return for post-RFA eval (6 wks)(w/ Dionisio David, NP). Future Appointments  Date Time Provider [place]  01/07/2017  1:45 PM Vevelyn Francois, NP [facility] None  02/05/2017  1:15 PM Milinda Pointer, MD [facility] None   Primary Care Physician: Ricardo Jericho, NP Location: [facility] Outpatient Pain Management Facility Note by: Gaspar Cola, MD Date: 12/24/2016; Time: 1:50 PM  Disclaimer:  Medicine is not an exact science. The only guarantee in medicine is that nothing is guaranteed. It is important to note that the decision to proceed with this intervention was based on the information collected from the patient. The Data and conclusions were drawn from the patient's questionnaire, the interview, and the physical examination. Because the information was provided in large part by the patient, it cannot be guaranteed that it has not been purposely or unconsciously manipulated. Every effort has been made to obtain as much relevant data as possible for this evaluation. It is important to note that the conclusions that lead to this procedure are derived in large part from the available data. Always take into account that the treatment will also be dependent on availability of resources and existing treatment guidelines, considered by other Pain Management Practitioners as being common knowledge and practice, at the time of the intervention. For Medico-Legal purposes, it is also important to point out that variation in procedural techniques and pharmacological choices are the acceptable norm. The indications, contraindications, technique, and results of the above procedure should only be interpreted and judged by a Board-Certified Interventional Pain Specialist with extensive familiarity and expertise in the same exact procedure and technique.

## 2016-12-24 NOTE — Patient Instructions (Signed)

## 2016-12-25 ENCOUNTER — Telehealth: Payer: Self-pay

## 2016-12-25 NOTE — Telephone Encounter (Signed)
Denies any needs at this time. States she is feeling better. Instructed to call if needed.

## 2017-01-07 ENCOUNTER — Ambulatory Visit: Payer: Medicaid Other | Attending: Nurse Practitioner | Admitting: Nurse Practitioner

## 2017-01-07 ENCOUNTER — Other Ambulatory Visit: Payer: Self-pay

## 2017-01-07 ENCOUNTER — Encounter: Payer: Self-pay | Admitting: Nurse Practitioner

## 2017-01-07 VITALS — BP 130/75 | HR 84 | Temp 97.6°F | Resp 16 | Ht 63.0 in | Wt 143.0 lb

## 2017-01-07 DIAGNOSIS — F411 Generalized anxiety disorder: Secondary | ICD-10-CM | POA: Insufficient documentation

## 2017-01-07 DIAGNOSIS — M25511 Pain in right shoulder: Secondary | ICD-10-CM | POA: Diagnosis not present

## 2017-01-07 DIAGNOSIS — E559 Vitamin D deficiency, unspecified: Secondary | ICD-10-CM | POA: Insufficient documentation

## 2017-01-07 DIAGNOSIS — M479 Spondylosis, unspecified: Secondary | ICD-10-CM | POA: Insufficient documentation

## 2017-01-07 DIAGNOSIS — R32 Unspecified urinary incontinence: Secondary | ICD-10-CM | POA: Diagnosis not present

## 2017-01-07 DIAGNOSIS — G894 Chronic pain syndrome: Secondary | ICD-10-CM

## 2017-01-07 DIAGNOSIS — G8929 Other chronic pain: Secondary | ICD-10-CM | POA: Diagnosis not present

## 2017-01-07 DIAGNOSIS — N3281 Overactive bladder: Secondary | ICD-10-CM | POA: Diagnosis not present

## 2017-01-07 DIAGNOSIS — F17210 Nicotine dependence, cigarettes, uncomplicated: Secondary | ICD-10-CM | POA: Diagnosis not present

## 2017-01-07 DIAGNOSIS — G8918 Other acute postprocedural pain: Secondary | ICD-10-CM | POA: Diagnosis not present

## 2017-01-07 DIAGNOSIS — M5412 Radiculopathy, cervical region: Secondary | ICD-10-CM

## 2017-01-07 DIAGNOSIS — M25512 Pain in left shoulder: Secondary | ICD-10-CM | POA: Diagnosis not present

## 2017-01-07 DIAGNOSIS — M79601 Pain in right arm: Secondary | ICD-10-CM | POA: Insufficient documentation

## 2017-01-07 DIAGNOSIS — E7800 Pure hypercholesterolemia, unspecified: Secondary | ICD-10-CM | POA: Insufficient documentation

## 2017-01-07 DIAGNOSIS — M542 Cervicalgia: Secondary | ICD-10-CM

## 2017-01-07 DIAGNOSIS — Z79899 Other long term (current) drug therapy: Secondary | ICD-10-CM | POA: Insufficient documentation

## 2017-01-07 DIAGNOSIS — F329 Major depressive disorder, single episode, unspecified: Secondary | ICD-10-CM | POA: Insufficient documentation

## 2017-01-07 DIAGNOSIS — M47812 Spondylosis without myelopathy or radiculopathy, cervical region: Secondary | ICD-10-CM | POA: Insufficient documentation

## 2017-01-07 DIAGNOSIS — M797 Fibromyalgia: Secondary | ICD-10-CM

## 2017-01-07 DIAGNOSIS — M545 Low back pain: Secondary | ICD-10-CM | POA: Diagnosis not present

## 2017-01-07 DIAGNOSIS — M329 Systemic lupus erythematosus, unspecified: Secondary | ICD-10-CM | POA: Insufficient documentation

## 2017-01-07 DIAGNOSIS — Z79891 Long term (current) use of opiate analgesic: Secondary | ICD-10-CM

## 2017-01-07 DIAGNOSIS — M7918 Myalgia, other site: Secondary | ICD-10-CM

## 2017-01-07 MED ORDER — CYCLOBENZAPRINE HCL 10 MG PO TABS
10.0000 mg | ORAL_TABLET | Freq: Three times a day (TID) | ORAL | 2 refills | Status: DC | PRN
Start: 2017-01-07 — End: 2017-04-08

## 2017-01-07 MED ORDER — OXYCODONE HCL 5 MG PO TABS
5.0000 mg | ORAL_TABLET | Freq: Two times a day (BID) | ORAL | 0 refills | Status: DC
Start: 2017-03-11 — End: 2017-04-08

## 2017-01-07 MED ORDER — GABAPENTIN 300 MG PO CAPS: 300.0000 mg | ORAL_CAPSULE | Freq: Three times a day (TID) | ORAL | 0 refills | Status: DC

## 2017-01-07 MED ORDER — OXYCODONE HCL 5 MG PO TABS: 5.0000 mg | ORAL_TABLET | Freq: Two times a day (BID) | ORAL | 0 refills | Status: DC

## 2017-01-07 MED ORDER — HYDROCODONE-ACETAMINOPHEN 5-325 MG PO TABS: 1.0000 | ORAL_TABLET | Freq: Three times a day (TID) | ORAL | 0 refills | Status: AC

## 2017-01-07 NOTE — Progress Notes (Signed)
Patient's Name: Tanya [name]  MRN: [id number]  Referring Provider: Ricardo Jericho*  DOB: 30-Dec-1960  PCP: Ricardo Jericho, NP  DOS: 01/07/2017  Note by: Vevelyn Francois NP  Service setting: Ambulatory outpatient  Specialty: Interventional Pain Management  Location: [facility] (AMB) Pain Management Facility    Patient type: Established    Primary Reason(s) for Visit: Encounter for prescription drug management. (Level of risk: moderate)  CC: Neck Pain  HPI  Tanya [name] is a 55 y.o. year old, female patient, who comes today for a medication management evaluation. She has Anxiety; Chronic upper back pain (Location of Secondary source of pain) (Bilateral) (R>L); Chronic discoid lupus erythematosus; Chronic low back pain (Location of Tertiary source of pain) (Bilateral) (R>L); Chronic neck pain (Location of Primary Source of Pain) (Bilateral) (R>L); Discoid lupus; Diverticulitis; Encounter for long-term (current) use of high-risk medication; Fibromyalgia; Generalized anxiety disorder; Hypercholesterolemia; Hyperlipidemia; Major depressive disorder, recurrent ([place]); Pain medication agreement signed; Osteoarthritis; SLE (systemic lupus erythematosus) ([place]); Urinary incontinence; Long term prescription benzodiazepine use; Long term current use of opiate analgesic; Long term prescription opiate use; Opiate use (10 MME/Day); Chronic pain syndrome; Chronic upper extremity pain (Right); Chronic cervical radicular pain (Right); Chronic shoulder pain (Bilateral) (R>L); 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 Her primarily concern today is the Neck Pain  Pain Assessment: Location:   Neck Radiating: n/a Onset: More than a month ago Duration: Chronic pain Quality: Burning,  Stabbing Severity: 6 /10 (self-reported pain score)  Note: Reported level is compatible with observation. Clinically the patient looks like a 2/10 A 2/10 is viewed as "Mild to Moderate" and described as noticeable and distracting. Impossible to hide from other people. More frequent flare-ups. Still possible to adapt and function close to normal. It can be very annoying and may have occasional stronger flare-ups. With discipline, patients may get used to it and adapt. Information on the proper use of the pain scale provided to the patient today. When using our objective Pain Scale, levels between 6 and 10/10 are said to belong in an emergency room, as it progressively worsens from a 6/10, described as severely limiting, requiring emergency care not usually available at an outpatient pain management facility. At a 6/10 level, communication becomes difficult and requires great effort. Assistance to reach the emergency department may be required. Facial flushing and profuse sweating along with potentially dangerous increases in heart rate and blood pressure will be evident. Effect on ADL:   Timing: Constant Modifying factors: lying , medications  Tanya [name] was last scheduled for an appointment on 10/08/2016 for medication management. During today's appointment we reviewed Tanya [name]'s chronic pain status, as well as her outpatient medication regimen. She is SP cervical facet RFA a few weeks ago. She denies any radiating pain . She states that this has resolved. She has weakness today. She states that she was told that the RFA would be like this. She states that it is painful to touch. She states that she was not sure of what to expect.   The patient  [name] that she does not use drugs. Her body mass index is 25.33 kg/m.  Further details on both, my assessment(s), as well as the proposed treatment plan, please see below.  Controlled Substance Pharmacotherapy Assessment REMS (Risk Evaluation and  Mitigation Strategy)  Analgesic:Oxycodone IR 5 mg 1 tablet by mouth twice  a day (10 mg/dayof oxycodone). MME/day: 33m/day.  WLandis Martins RN  01/07/2017  1:53 PM  Sign at close encounter Nursing Pain Medication Assessment:  Safety precautions to be maintained throughout the outpatient stay will include: orient to surroundings, keep bed in low position, maintain call bell within reach at all times, provide assistance with transfer out of bed and ambulation.  Medication Inspection Compliance: Pill count conducted under aseptic conditions, in front of the patient. Neither the pills nor the bottle was removed from the patient's sight at any time. Once count was completed pills were immediately returned to the patient in their original bottle.  Medication: Oxycodone IR Pill/Patch Count: 0 of 60 pills remain Pill/Patch Appearance: Markings consistent with prescribed medication Bottle Appearance: Standard pharmacy container. Clearly labeled. Filled Date: 11/23 / 2018 Last Medication intake:  Ran out of medicine more than 48 hours ago  Received Hydrocodone for post RF, #21. Ran out 1 week ago.   Pharmacokinetics: Liberation and absorption (onset of action): WNL Distribution (time to peak effect): WNL Metabolism and excretion (duration of action): WNL         Pharmacodynamics: Desired effects: Analgesia: Ms. SMccarthyreports >50% benefit. Functional ability: [name] that medication allows her to accomplish basic ADLs Clinically meaningful improvement in function (CMIF): Sustained CMIF goals met Perceived effectiveness: Described as relatively effective, allowing for increase in activities of daily living (ADL) Undesirable effects: Side-effects or Adverse reactions: None reported Monitoring: [place] PMP: Online review of the past 1109-montheriod conducted. Compliant with practice rules and regulations Last UDS on record: Summary  Date Value Ref Range Status  07/11/2016 FINAL  Final     Comment:    ==================================================================== TOXASSURE SELECT 13 (MW) ==================================================================== Test                             Result       Flag       Units   NO DRUGS DETECTED. ==================================================================== Test                      Result    Flag   Units      Ref Range   Creatinine              26               mg/dL      >=20 ==================================================================== Declared Medications:  Medication list was not provided. ==================================================================== For clinical consultation, please call [phone]====    UDS interpretation: Compliant          Medication Assessment Form: Reviewed. Patient indicates being compliant with therapy Treatment compliance: Compliant Risk Assessment Profile: Aberrant behavior: See prior evaluations. None observed or detected today Comorbid factors increasing risk of overdose: See prior notes. No additional risks detected today Risk of substance use disorder (SUD): Low Opioid Risk Tool - 12/24/16 1048      Family History of Substance Abuse   Alcohol  Negative    Illegal Drugs  Negative    Rx Drugs  Negative      Personal History of Substance Abuse   Alcohol  Negative    Illegal Drugs  Negative    Rx Drugs  Negative      Age   Age between 1649-45ears   No      History of Preadolescent Sexual Abuse   History of Preadolescent Sexual Abuse  Negative or Female  Psychological Disease   Psychological Disease  Negative    Depression  Positive      Total Score   Opioid Risk Tool Scoring  1    Opioid Risk Interpretation  Low Risk      ORT Scoring interpretation table:  Score <3 = Low Risk for SUD  Score between 4-7 = Moderate Risk for SUD  Score >8 = High Risk for Opioid Abuse   Risk  Mitigation Strategies:  Patient Counseling: Covered Patient-Prescriber Agreement (PPA): Present and active  Notification to other healthcare providers: Done  Pharmacologic Plan: No change in therapy, at this time  Laboratory Chemistry  Inflammation Markers (CRP: Acute Phase) (ESR: Chronic Phase) Lab Results  Component Value Date   CRP 6.0 (H) 07/11/2016   ESRSEDRATE 7 07/11/2016                 Rheumatology Markers No results found for: Elayne Guerin, [facility]              Renal Function Markers Lab Results  Component Value Date   BUN 14 09/08/2016   CREATININE 0.85 09/08/2016   GFRAA >60 09/08/2016   GFRNONAA >60 09/08/2016                 Hepatic Function Markers Lab Results  Component Value Date   AST 21 09/08/2016   ALT 16 09/08/2016   ALBUMIN 4.4 09/08/2016   ALKPHOS 36 (L) 09/08/2016   LIPASE 37 09/08/2016                 Electrolytes Lab Results  Component Value Date   NA 137 09/08/2016   K 3.4 (L) 09/08/2016   CL 102 09/08/2016   CALCIUM 9.2 09/08/2016   MG 2.2 07/11/2016                 Neuropathy Markers Lab Results  Component Value Date   VITAMINB12 310 07/11/2016                 Bone Pathology Markers Lab Results  Component Value Date   25OHVITD1 9.6 (L) 07/11/2016   25OHVITD2 <1.0 07/11/2016   25OHVITD3 9.4 07/11/2016                 Coagulation Parameters Lab Results  Component Value Date   PLT 375 09/08/2016                 Cardiovascular Markers Lab Results  Component Value Date   HGB 13.4 09/08/2016   HCT 40.0 09/08/2016                 CA Markers No results found for: CEA, CA125, LABCA2               Note: Lab results reviewed.  Recent Diagnostic Imaging Results  DG C-Arm 1-60 Min-No Report Fluoroscopy was utilized by the requesting physician.  No radiographic  interpretation.   Complexity Note: Imaging results reviewed. Results shared with Ms. Buras, using Layman's terms.                          Meds   Current Outpatient Medications:  .  Cholecalciferol (VITAMIN D3) 2000 units capsule, Take 1 capsule (2,000 Units total) by mouth daily., Disp: 30 capsule, Rfl: PRN .  cyclobenzaprine (FLEXERIL) 10 MG tablet, Take 1 tablet (10 mg total) by mouth 3 (three) times daily as needed for muscle spasms., Disp: 90 tablet,  Rfl: 2 .  gabapentin (NEURONTIN) 300 MG capsule, Take 1 capsule (300 mg total) by mouth 3 (three) times daily., Disp: 270 capsule, Rfl: 0 .  [START ON 03/11/2017] oxyCODONE (OXY IR/ROXICODONE) 5 MG immediate release tablet, Take 1 tablet (5 mg total) by mouth 2 (two) times daily., Disp: 60 tablet, Rfl: 0 .  DULoxetine (CYMBALTA) 30 MG capsule, TAKE 1 CAPSULE(30 MG) BY MOUTH EVERY DAY, Disp: , Rfl:  .  HYDROcodone-acetaminophen (NORCO/VICODIN) 5-325 MG tablet, Take 1 tablet by mouth 3 (three) times daily for 7 days., Disp: 21 tablet, Rfl: 0 .  [START ON 02/09/2017] oxyCODONE (OXY IR/ROXICODONE) 5 MG immediate release tablet, Take 1 tablet (5 mg total) by mouth 2 (two) times daily., Disp: 60 tablet, Rfl: 0 .  [START ON 01/10/2017] oxyCODONE (OXY IR/ROXICODONE) 5 MG immediate release tablet, Take 1 tablet (5 mg total) by mouth 2 (two) times daily., Disp: 60 tablet, Rfl: 0  ROS  Constitutional: Denies any fever or chills Gastrointestinal: No reported hemesis, hematochezia, vomiting, or acute GI distress Musculoskeletal: Denies any acute onset joint swelling, redness, loss of ROM, or weakness Neurological: No reported episodes of acute onset apraxia, aphasia, dysarthria, agnosia, amnesia, paralysis, loss of coordination, or loss of consciousness  Allergies  Tanya [name] is allergic to trazodone; bupropion; methocarbamol; oxycodone-acetaminophen; tramadol; and trazodone and nefazodone.  [place]  Drug: Tanya [name]  [name] that she does not use drugs. Alcohol:  [name] that she does not drink alcohol. Tobacco:  [name] that she has been smoking cigarettes.  She has  been smoking about 0.50 packs per day. she has never used smokeless tobacco. Medical:  has a past medical history of Anxiety, Bladder infection, Depression, Hyperlipidemia, Lupus, and Overactive bladder. Surgical: Tanya [name]  has a past surgical history that includes Abdominal hysterectomy. Family: family history includes Cancer in her mother; Diabetes in her brother; Emphysema in her mother; Glaucoma in her father; Heart disease in her father; Hypertension in her mother; Stroke in her mother.  Constitutional Exam  General appearance: alert, cooperative and in mild distress Vitals:   01/07/17 1346  BP: 130/75  Pulse: 84  Resp: 16  Temp: 97.6 F (36.4 C)  TempSrc: Oral  SpO2: 100%  Weight: 143 lb (64.9 kg)  Height: 5' 3"  (1.6 m)   BMI Assessment: Estimated body mass index is 25.33 kg/m as calculated from the following:   Height as of this encounter: 5' 3"  (1.6 m).   Weight as of this encounter: 143 lb (64.9 kg). Psych/Mental status: Alert, oriented x 3 (person, place, & time)       Eyes: PERLA Respiratory: No evidence of acute respiratory distress  Cervical Spine Area Exam  Skin & Axial Inspection: No masses, redness, edema, swelling, or associated skin lesions Alignment: Symmetrical Functional ROM: Unrestricted ROM      Stability: No instability detected Muscle Tone/Strength: Functionally intact. No obvious neuro-muscular anomalies detected. Sensory (Neurological): Unimpaired Palpation: Complains of area being tender to palpation              Upper Extremity (UE) Exam    Side: Right upper extremity  Side: Left upper extremity  Skin & Extremity Inspection: Skin color, temperature, and hair growth are WNL. No peripheral edema or cyanosis. No masses, redness, swelling, asymmetry, or associated skin lesions. No contractures.  Skin & Extremity Inspection: Skin color, temperature, and hair growth are WNL. No peripheral edema or cyanosis. No masses, redness, swelling, asymmetry, or  associated skin lesions. No contractures.  Functional ROM: Unrestricted ROM  Functional ROM: Unrestricted ROM          Muscle Tone/Strength: Functionally intact. No obvious neuro-muscular anomalies detected.  Muscle Tone/Strength: Functionally intact. No obvious neuro-muscular anomalies detected.  Sensory (Neurological): Unimpaired          Sensory (Neurological): Unimpaired          Palpation: No palpable anomalies              Palpation: No palpable anomalies              Specialized Test(s): Deferred         Specialized Test(s): Deferred          Thoracic Spine Area Exam  Skin & Axial Inspection: No masses, redness, or swelling Alignment: Symmetrical Functional ROM: Unrestricted ROM Stability: No instability detected Muscle Tone/Strength: Functionally intact. No obvious neuro-muscular anomalies detected. Sensory (Neurological): Unimpaired Muscle strength & Tone: No palpable anomalies  Lumbar Spine Area Exam  Skin & Axial Inspection: No masses, redness, or swelling Alignment: Symmetrical Functional ROM: Unrestricted ROM      Stability: No instability detected Muscle Tone/Strength: Functionally intact. No obvious neuro-muscular anomalies detected. Sensory (Neurological): Unimpaired Palpation: No palpable anomalies       Provocative Tests: Lumbar Hyperextension and rotation test: evaluation deferred today       Lumbar Lateral bending test: evaluation deferred today       Patrick's Maneuver: evaluation deferred today                    Gait & Posture Assessment  Ambulation: Unassisted Gait: Relatively normal for age and body habitus Posture: WNL   Lower Extremity Exam    Side: Right lower extremity  Side: Left lower extremity  Skin & Extremity Inspection: Skin color, temperature, and hair growth are WNL. No peripheral edema or cyanosis. No masses, redness, swelling, asymmetry, or associated skin lesions. No contractures.  Skin & Extremity Inspection: Skin color,  temperature, and hair growth are WNL. No peripheral edema or cyanosis. No masses, redness, swelling, asymmetry, or associated skin lesions. No contractures.  Functional ROM: Unrestricted ROM          Functional ROM: Unrestricted ROM          Muscle Tone/Strength: Functionally intact. No obvious neuro-muscular anomalies detected.  Muscle Tone/Strength: Functionally intact. No obvious neuro-muscular anomalies detected.  Sensory (Neurological): Unimpaired  Sensory (Neurological): Unimpaired  Palpation: No palpable anomalies  Palpation: No palpable anomalies   Assessment  Primary Diagnosis & Pertinent Problem List: The primary encounter diagnosis was Chronic neck pain (Location of Primary Source of Pain) (Bilateral) (R>L). Diagnoses of Chronic cervical radicular pain (Right), Chronic low back pain (Location of Tertiary source of pain) (Bilateral) (R>L), Fibromyalgia, Musculoskeletal pain, Chronic pain syndrome, and Long term prescription opiate use were also pertinent to this visit.  Status Diagnosis  Unimproved Unimproved Controlled 1. Chronic neck pain (Location of Primary Source of Pain) (Bilateral) (R>L)   2. Chronic cervical radicular pain (Right)   3. Chronic low back pain (Location of Tertiary source of pain) (Bilateral) (R>L)   4. Fibromyalgia   5. Musculoskeletal pain   6. Chronic pain syndrome   7. Long term prescription opiate use     Problems updated and reviewed during this visit: No problems updated. Plan of Care  Pharmacotherapy (Medications Ordered): Meds ordered this encounter  Medications  . oxyCODONE (OXY IR/ROXICODONE) 5 MG immediate release tablet    Sig: Take 1 tablet (5 mg total) by mouth 2 (two) times daily.  Dispense:  60 tablet    Refill:  0    Do not place this medication, or any other prescription from our practice, on "Automatic Refill". Patient may have prescription filled one day early if pharmacy is closed on scheduled refill date. Do not fill until:  03/11/2017 To last until: 04/10/2017    Order Specific Question:   Supervising Provider    Answer:   Milinda Pointer [phone]  . oxyCODONE (OXY IR/ROXICODONE) 5 MG immediate release tablet    Sig: Take 1 tablet (5 mg total) by mouth 2 (two) times daily.    Dispense:  60 tablet    Refill:  0    Do not place this medication, or any other prescription from our practice, on "Automatic Refill". Patient may have prescription filled one day early if pharmacy is closed on scheduled refill date. Do not fill until:02/09/2017  To last until:03/11/2017    Order Specific Question:   Supervising Provider    Answer:   Milinda Pointer [phone]  . oxyCODONE (OXY IR/ROXICODONE) 5 MG immediate release tablet    Sig: Take 1 tablet (5 mg total) by mouth 2 (two) times daily.    Dispense:  60 tablet    Refill:  0    Do not place this medication, or any other prescription from our practice, on "Automatic Refill". Patient may have prescription filled one day early if pharmacy is closed on scheduled refill date. Do not fill until: 01/10/2017 To last until:02/09/2017    Order Specific Question:   Supervising Provider    Answer:   Milinda Pointer [phone]  . gabapentin (NEURONTIN) 300 MG capsule    Sig: Take 1 capsule (300 mg total) by mouth 3 (three) times daily.    Dispense:  270 capsule    Refill:  0    Do not add this medication to the electronic "Automatic Refill" notification system. Patient may have prescription filled one day early if pharmacy is closed on scheduled refill date.    Order Specific Question:   Supervising Provider    Answer:   Milinda Pointer [phone]  . cyclobenzaprine (FLEXERIL) 10 MG tablet    Sig: Take 1 tablet (10 mg total) by mouth 3 (three) times daily as needed for muscle spasms.    Dispense:  90 tablet    Refill:  2    Do not add this medication to the electronic "Automatic Refill" notification system. Patient may have prescription filled one day early if pharmacy is  closed on scheduled refill date.    Order Specific Question:   Supervising Provider    Answer:   Milinda Pointer [phone]  . HYDROcodone-acetaminophen (NORCO/VICODIN) 5-325 MG tablet    Sig: Take 1 tablet by mouth 3 (three) times daily for 7 days.    Dispense:  21 tablet    Refill:  0    Do not place this medication, or any other prescription from our practice, on "Automatic Refill". Patient may have prescription filled one day early if pharmacy is closed on scheduled refill date. Do not fill until:  To last until:    Order Specific Question:   Supervising Provider    Answer:   Milinda Pointer [phone]  This SmartLink is deprecated. Use AVSMEDLIST instead to display the medication list for a patient. Medications administered today: Tanya [name] had no medications administered during this visit. Lab-work, procedure(s), and/or referral(s): Orders Placed This Encounter  Procedures  . ToxASSURE Select 13 (MW), Urine   Imaging and/or referral(s): None  Interventional therapies: Planned, scheduled, and/or pending:  Not at this time. Norco refilled for the acute pain from RFA   Considering:  Diagnostic bilateral intra-articular shoulder joint injection Diagnostic bilateral suprascapular nerve block Possible bilateral suprascapular nerve RFA Right-sided cervical epidural steroid injection(Series #2) Diagnostic bilateral cervical facet block Possible bilateral cervical facet radiofrequencyablation  Diagnostic bilateral lumbar facet block Possible bilateral lumbar facet radiofrequency ablation    Palliative PRN treatment(s):  Right-sided cervical epidural steroid injection(Series #2)   Provider-requested follow-up: Return in about 3 months (around 04/07/2017) for MedMgmt with Me Donella Stade Edison Pace).  Future Appointments  Date Time Provider [place]  02/05/2017  1:15 PM Milinda Pointer, MD [facility] None  04/03/2017  1:45 PM Vevelyn Francois, NP  [facility] None   Primary Care Physician: Ricardo Jericho, NP Location: [facility] Management Facility Note by: Vevelyn Francois NP Date: 01/07/2017; Time: 1:32 PM  Pain Score Disclaimer: We use the NRS-11 scale. This is a self-reported, subjective measurement of pain severity with only modest accuracy. It is used primarily to identify changes within a particular patient. It must be understood that outpatient pain scales are significantly less accurate that those used for research, where they can be applied under ideal controlled circumstances with minimal exposure to variables. In reality, the score is likely to be a combination of pain intensity and pain affect, where pain affect describes the degree of emotional arousal or changes in action readiness caused by the sensory experience of pain. Factors such as social and work situation, setting, emotional state, anxiety levels, expectation, and prior pain experience may influence pain perception and show large inter-individual differences that may also be affected by time variables.  Patient instructions provided during this appointment: Patient Instructions    ____________________________________________________________________________________________  Medication Rules  Applies to: All patients receiving prescriptions (written or electronic).  Pharmacy of record: Pharmacy where electronic prescriptions will be sent. If written prescriptions are taken to a different pharmacy, please inform the nursing staff. The pharmacy listed in the electronic medical record should be the one where you would like electronic prescriptions to be sent.  Prescription refills: Only during scheduled appointments. Applies to both, written and electronic prescriptions.  NOTE: The following applies primarily to controlled substances (Opioid* Pain Medications).   Patient's responsibilities: 1. Pain Pills: Bring all pain pills to every appointment  (except for procedure appointments). 2. Pill Bottles: Bring pills in original pharmacy bottle. Always bring newest bottle. Bring bottle, even if empty. 3. Medication refills: You are responsible for knowing and keeping track of what medications you need refilled. The day before your appointment, write a list of all prescriptions that need to be refilled. Bring that list to your appointment and give it to the admitting nurse. Prescriptions will be written only during appointments. If you forget a medication, it will not be "Called in", "Faxed", or "electronically sent". You will need to get another appointment to get these prescribed. 4. Prescription Accuracy: You are responsible for carefully inspecting your prescriptions before leaving our office. Have the discharge nurse carefully go over each prescription with you, before taking them home. Make sure that your name is accurately spelled, that your address is correct. Check the name and dose of your medication to make sure it is accurate. Check the number of pills, and the written instructions to make sure they are clear and accurate. Make sure that you are given enough medication to last until your next medication refill appointment. 5. Taking Medication: Take medication as prescribed.  Never take more pills than instructed. Never take medication more frequently than prescribed. Taking less pills or less frequently is permitted and encouraged, when it comes to controlled substances (written prescriptions).  6. Inform other Doctors: Always inform, all of your healthcare providers, of all the medications you take. 7. Pain Medication from other Providers: You are not allowed to accept any additional pain medication from any other Doctor or Healthcare provider. There are two exceptions to this rule. (see below) In the event that you require additional pain medication, you are responsible for notifying us, as stated below. 8. Medication Agreement: You are  responsible for carefully reading and following our Medication Agreement. This must be signed before receiving any prescriptions from our practice. Safely store a copy of your signed Agreement. Violations to the Agreement will result in no further prescriptions. (Additional copies of our Medication Agreement are available upon request.) 9. Laws, Rules, & Regulations: All patients are expected to follow all Federal and [employer], [employer], Rules, [employer]. Ignorance of the Laws does not constitute a valid excuse. The use of any illegal substances is prohibited. 10. Adopted CDC guidelines & recommendations: Target dosing levels will be at or below 60 MME/day. Use of benzodiazepines** is not recommended.  Exceptions: There are only two exceptions to the rule of not receiving pain medications from other Healthcare Providers. 1. Exception #1 (Emergencies): In the event of an emergency (i.e.: accident requiring emergency care), you are allowed to receive additional pain medication. However, you are responsible for: As soon as you are able, call our office [phone], at any time of the day or night, and leave a message stating your name, the date and nature of the emergency, and the name and dose of the medication prescribed. In the event that your call is answered by a member of our staff, make sure to document and save the date, time, and the name of the person that took your information.  2. Exception #2 (Planned Surgery): In the event that you are scheduled by another doctor or dentist to have any type of surgery or procedure, you are allowed (for a period no longer than 30 days), to receive additional pain medication, for the acute post-op pain. However, in this case, you are responsible for picking up a copy of our "Post-op Pain Management for Surgeons" handout, and giving it to your surgeon or dentist. This document is available at our office, and does not require an appointment to obtain it. Simply  go to our office during business hours (Monday-Thursday from 8:00 AM to 4:00 PM) (Friday 8:00 AM to 12:00 Noon) or if you have a scheduled appointment with [place], prior to your surgery, and ask for it by name. In addition, you will need to provide [place] with your name, name of your surgeon, type of surgery, and date of procedure or surgery.  *Opioid medications include: morphine, codeine, oxycodone, oxymorphone, hydrocodone, hydromorphone, meperidine, tramadol, tapentadol, buprenorphine, fentanyl, methadone. **Benzodiazepine medications include: diazepam (Valium), alprazolam (Xanax), clonazepam (Klonopine), lorazepam (Ativan), clorazepate (Tranxene), chlordiazepoxide (Librium), estazolam (Prosom), oxazepam (Serax), temazepam (Restoril), triazolam (Halcion)  ____________________________________________________________________________________________  ____________________________________________________________________________________________  Pain Scale  Introduction: The pain score used by this practice is the Verbal Numerical Rating Scale (VNRS-11). This is an 11-point scale. It is for adults and children 10 years or older. There are significant differences in how the pain score is reported, used, and applied. Forget everything you learned in the past and learn this scoring system.  General Information: The scale should  reflect your current level of pain. Unless you are specifically asked for the level of your worst pain, or your average pain. If you are asked for one of these two, then it should be understood that it is over the past 24 hours.  Basic Activities of Daily Living (ADL): Personal hygiene, dressing, eating, transferring, and using restroom.  Instructions: Most patients tend to report their level of pain as a combination of two factors, their physical pain and their psychosocial pain. This last one is also known as "suffering" and it is reflection of how physical pain affects you socially and  psychologically. From now on, report them separately. From this point on, when asked to report your pain level, report only your physical pain. Use the following table for reference.  Pain Clinic Pain Levels (0-5/10)  Pain Level Score  Description  No Pain 0   Mild pain 1 Nagging, annoying, but does not interfere with basic activities of daily living (ADL). Patients are able to eat, bathe, get dressed, toileting (being able to get on and off the toilet and perform personal hygiene functions), transfer (move in and out of bed or a chair without assistance), and maintain continence (able to control bladder and bowel functions). Blood pressure and heart rate are unaffected. A normal heart rate for a healthy adult ranges from 60 to 100 bpm (beats per minute).   Mild to moderate pain 2 Noticeable and distracting. Impossible to hide from other people. More frequent flare-ups. Still possible to adapt and function close to normal. It can be very annoying and may have occasional stronger flare-ups. With discipline, patients may get used to it and adapt.   Moderate pain 3 Interferes significantly with activities of daily living (ADL). It becomes difficult to feed, bathe, get dressed, get on and off the toilet or to perform personal hygiene functions. Difficult to get in and out of bed or a chair without assistance. Very distracting. With effort, it can be ignored when deeply involved in activities.   Moderately severe pain 4 Impossible to ignore for more than a few minutes. With effort, patients may still be able to manage work or participate in some social activities. Very difficult to concentrate. Signs of autonomic nervous system discharge are evident: dilated pupils (mydriasis); mild sweating (diaphoresis); sleep interference. Heart rate becomes elevated (>115 bpm). Diastolic blood pressure (lower number) rises above 100 mmHg. Patients find relief in laying down and not moving.   Severe pain 5 Intense and  extremely unpleasant. Associated with frowning face and frequent crying. Pain overwhelms the senses.  Ability to do any activity or maintain social relationships becomes significantly limited. Conversation becomes difficult. Pacing back and forth is common, as getting into a comfortable position is nearly impossible. Pain wakes you up from deep sleep. Physical signs will be obvious: pupillary dilation; increased sweating; goosebumps; brisk reflexes; cold, clammy hands and feet; nausea, vomiting or dry heaves; loss of appetite; significant sleep disturbance with inability to fall asleep or to remain asleep. When persistent, significant weight loss is observed due to the complete loss of appetite and sleep deprivation.  Blood pressure and heart rate becomes significantly elevated. Caution: If elevated blood pressure triggers a pounding headache associated with blurred vision, then the patient should immediately seek attention at an urgent or emergency care unit, as these may be signs of an impending stroke.    Emergency Department Pain Levels (6-10/10)  Emergency Room Pain 6 Severely limiting. Requires emergency care and should not be seen  or managed at an outpatient pain management facility. Communication becomes difficult and requires great effort. Assistance to reach the emergency department may be required. Facial flushing and profuse sweating along with potentially dangerous increases in heart rate and blood pressure will be evident.   Distressing pain 7 Self-care is very difficult. Assistance is required to transport, or use restroom. Assistance to reach the emergency department will be required. Tasks requiring coordination, such as bathing and getting dressed become very difficult.   Disabling pain 8 Self-care is no longer possible. At this level, pain is disabling. The individual is unable to do even the most "basic" activities such as walking, eating, bathing, dressing, transferring to a bed, or  toileting. Fine motor skills are lost. It is difficult to think clearly.   Incapacitating pain 9 Pain becomes incapacitating. Thought processing is no longer possible. Difficult to remember your own name. Control of movement and coordination are lost.   The worst pain imaginable 10 At this level, most patients pass out from pain. When this level is reached, collapse of the autonomic nervous system occurs, leading to a sudden drop in blood pressure and heart rate. This in turn results in a temporary and dramatic drop in blood flow to the brain, leading to a loss of consciousness. Fainting is one of the body's self defense mechanisms. Passing out puts the brain in a calmed state and causes it to shut down for a while, in order to begin the healing process.    Summary: 1. Refer to this scale when providing [place] with your pain level. 2. Be accurate and careful when reporting your pain level. This will help with your care. 3. Over-reporting your pain level will lead to loss of credibility. 4. Even a level of 1/10 means that there is pain and will be treated at our facility. 5. High, inaccurate reporting will be documented as "Symptom Exaggeration", leading to loss of credibility and suspicions of possible secondary gains such as obtaining more narcotics, or wanting to appear disabled, for fraudulent reasons. 6. Only pain levels of 5 or below will be seen at our facility. 7. Pain levels of 6 and above will be sent to the Emergency Department and the appointment cancelled. ____________________________________________________________________________________________  Prescription given for hydrocodone and oxycodone x3

## 2017-01-07 NOTE — Progress Notes (Signed)
Nursing Pain Medication Assessment:  Safety precautions to be maintained throughout the outpatient stay will include: orient to surroundings, keep bed in low position, maintain call bell within reach at all times, provide assistance with transfer out of bed and ambulation.  Medication Inspection Compliance: Pill count conducted under aseptic conditions, in front of the patient. Neither the pills nor the bottle was removed from the patient's sight at any time. Once count was completed pills were immediately returned to the patient in their original bottle.  Medication: Oxycodone IR Pill/Patch Count: 0 of 60 pills remain Pill/Patch Appearance: Markings consistent with prescribed medication Bottle Appearance: Standard pharmacy container. Clearly labeled. Filled Date: 11/23 / 2018 Last Medication intake:  Ran out of medicine more than 48 hours ago  Received Hydrocodone for post RF, #21. Ran out 1 week ago.

## 2017-01-07 NOTE — Patient Instructions (Addendum)
____________________________________________________________________________________________  Medication Rules  Applies to: All patients receiving prescriptions (written or electronic).  Pharmacy of record: Pharmacy where electronic prescriptions will be sent. If written prescriptions are taken to a different pharmacy, please inform the nursing staff. The pharmacy listed in the electronic medical record should be the one where you would like electronic prescriptions to be sent.  Prescription refills: Only during scheduled appointments. Applies to both, written and electronic prescriptions.  NOTE: The following applies primarily to controlled substances (Opioid* Pain Medications).   Patient's responsibilities: 1. Pain Pills: Bring all pain pills to every appointment (except for procedure appointments). 2. Pill Bottles: Bring pills in original pharmacy bottle. Always bring newest bottle. Bring bottle, even if empty. 3. Medication refills: You are responsible for knowing and keeping track of what medications you need refilled. The day before your appointment, write a list of all prescriptions that need to be refilled. Bring that list to your appointment and give it to the admitting nurse. Prescriptions will be written only during appointments. If you forget a medication, it will not be "Called in", "Faxed", or "electronically sent". You will need to get another appointment to get these prescribed. 4. Prescription Accuracy: You are responsible for carefully inspecting your prescriptions before leaving our office. Have the discharge nurse carefully go over each prescription with you, before taking them home. Make sure that your name is accurately spelled, that your address is correct. Check the name and dose of your medication to make sure it is accurate. Check the number of pills, and the written instructions to make sure they are clear and accurate. Make sure that you are given enough medication to  last until your next medication refill appointment. 5. Taking Medication: Take medication as prescribed. Never take more pills than instructed. Never take medication more frequently than prescribed. Taking less pills or less frequently is permitted and encouraged, when it comes to controlled substances (written prescriptions).  6. Inform other Doctors: Always inform, all of your healthcare providers, of all the medications you take. 7. Pain Medication from other Providers: You are not allowed to accept any additional pain medication from any other Doctor or Healthcare provider. There are two exceptions to this rule. (see below) In the event that you require additional pain medication, you are responsible for notifying us, as stated below. 8. Medication Agreement: You are responsible for carefully reading and following our Medication Agreement. This must be signed before receiving any prescriptions from our practice. Safely store a copy of your signed Agreement. Violations to the Agreement will result in no further prescriptions. (Additional copies of our Medication Agreement are available upon request.) 9. Laws, Rules, & Regulations: All patients are expected to follow all Federal and State Laws, Statutes, Rules, & Regulations. Ignorance of the Laws does not constitute a valid excuse. The use of any illegal substances is prohibited. 10. Adopted CDC guidelines & recommendations: Target dosing levels will be at or below 60 MME/day. Use of benzodiazepines** is not recommended.  Exceptions: There are only two exceptions to the rule of not receiving pain medications from other Healthcare Providers. 1. Exception #1 (Emergencies): In the event of an emergency (i.e.: accident requiring emergency care), you are allowed to receive additional pain medication. However, you are responsible for: As soon as you are able, call our office [phone], at any time of the day or night, and leave a message stating your  name, the date and nature of the emergency, and the name and dose of the medication   prescribed. In the event that your call is answered by a member of our staff, make sure to document and save the date, time, and the name of the person that took your information.  2. Exception #2 (Planned Surgery): In the event that you are scheduled by another doctor or dentist to have any type of surgery or procedure, you are allowed (for a period no longer than 30 days), to receive additional pain medication, for the acute post-op pain. However, in this case, you are responsible for picking up a copy of our "Post-op Pain Management for Surgeons" handout, and giving it to your surgeon or dentist. This document is available at our office, and does not require an appointment to obtain it. Simply go to our office during business hours (Monday-Thursday from 8:00 AM to 4:00 PM) (Friday 8:00 AM to 12:00 Noon) or if you have a scheduled appointment with us, prior to your surgery, and ask for it by name. In addition, you will need to provide us with your name, name of your surgeon, type of surgery, and date of procedure or surgery.  *Opioid medications include: morphine, codeine, oxycodone, oxymorphone, hydrocodone, hydromorphone, meperidine, tramadol, tapentadol, buprenorphine, fentanyl, methadone. **Benzodiazepine medications include: diazepam (Valium), alprazolam (Xanax), clonazepam (Klonopine), lorazepam (Ativan), clorazepate (Tranxene), chlordiazepoxide (Librium), estazolam (Prosom), oxazepam (Serax), temazepam (Restoril), triazolam (Halcion)  ____________________________________________________________________________________________  ____________________________________________________________________________________________  Pain Scale  Introduction: The pain score used by this practice is the Verbal Numerical Rating Scale (VNRS-11). This is an 11-point scale. It is for adults and children 10 years or older. There are  significant differences in how the pain score is reported, used, and applied. Forget everything you learned in the past and learn this scoring system.  General Information: The scale should reflect your current level of pain. Unless you are specifically asked for the level of your worst pain, or your average pain. If you are asked for one of these two, then it should be understood that it is over the past 24 hours.  Basic Activities of Daily Living (ADL): Personal hygiene, dressing, eating, transferring, and using restroom.  Instructions: Most patients tend to report their level of pain as a combination of two factors, their physical pain and their psychosocial pain. This last one is also known as "suffering" and it is reflection of how physical pain affects you socially and psychologically. From now on, report them separately. From this point on, when asked to report your pain level, report only your physical pain. Use the following table for reference.  Pain Clinic Pain Levels (0-5/10)  Pain Level Score  Description  No Pain 0   Mild pain 1 Nagging, annoying, but does not interfere with basic activities of daily living (ADL). Patients are able to eat, bathe, get dressed, toileting (being able to get on and off the toilet and perform personal hygiene functions), transfer (move in and out of bed or a chair without assistance), and maintain continence (able to control bladder and bowel functions). Blood pressure and heart rate are unaffected. A normal heart rate for a healthy adult ranges from 60 to 100 bpm (beats per minute).   Mild to moderate pain 2 Noticeable and distracting. Impossible to hide from other people. More frequent flare-ups. Still possible to adapt and function close to normal. It can be very annoying and may have occasional stronger flare-ups. With discipline, patients may get used to it and adapt.   Moderate pain 3 Interferes significantly with activities of daily living (ADL). It  becomes difficult   to feed, bathe, get dressed, get on and off the toilet or to perform personal hygiene functions. Difficult to get in and out of bed or a chair without assistance. Very distracting. With effort, it can be ignored when deeply involved in activities.   Moderately severe pain 4 Impossible to ignore for more than a few minutes. With effort, patients may still be able to manage work or participate in some social activities. Very difficult to concentrate. Signs of autonomic nervous system discharge are evident: dilated pupils (mydriasis); mild sweating (diaphoresis); sleep interference. Heart rate becomes elevated (>115 bpm). Diastolic blood pressure (lower number) rises above 100 mmHg. Patients find relief in laying down and not moving.   Severe pain 5 Intense and extremely unpleasant. Associated with frowning face and frequent crying. Pain overwhelms the senses.  Ability to do any activity or maintain social relationships becomes significantly limited. Conversation becomes difficult. Pacing back and forth is common, as getting into a comfortable position is nearly impossible. Pain wakes you up from deep sleep. Physical signs will be obvious: pupillary dilation; increased sweating; goosebumps; brisk reflexes; cold, clammy hands and feet; nausea, vomiting or dry heaves; loss of appetite; significant sleep disturbance with inability to fall asleep or to remain asleep. When persistent, significant weight loss is observed due to the complete loss of appetite and sleep deprivation.  Blood pressure and heart rate becomes significantly elevated. Caution: If elevated blood pressure triggers a pounding headache associated with blurred vision, then the patient should immediately seek attention at an urgent or emergency care unit, as these may be signs of an impending stroke.    Emergency Department Pain Levels (6-10/10)  Emergency Room Pain 6 Severely limiting. Requires emergency care and should not be  seen or managed at an outpatient pain management facility. Communication becomes difficult and requires great effort. Assistance to reach the emergency department may be required. Facial flushing and profuse sweating along with potentially dangerous increases in heart rate and blood pressure will be evident.   Distressing pain 7 Self-care is very difficult. Assistance is required to transport, or use restroom. Assistance to reach the emergency department will be required. Tasks requiring coordination, such as bathing and getting dressed become very difficult.   Disabling pain 8 Self-care is no longer possible. At this level, pain is disabling. The individual is unable to do even the most "basic" activities such as walking, eating, bathing, dressing, transferring to a bed, or toileting. Fine motor skills are lost. It is difficult to think clearly.   Incapacitating pain 9 Pain becomes incapacitating. Thought processing is no longer possible. Difficult to remember your own name. Control of movement and coordination are lost.   The worst pain imaginable 10 At this level, most patients pass out from pain. When this level is reached, collapse of the autonomic nervous system occurs, leading to a sudden drop in blood pressure and heart rate. This in turn results in a temporary and dramatic drop in blood flow to the brain, leading to a loss of consciousness. Fainting is one of the body's self defense mechanisms. Passing out puts the brain in a calmed state and causes it to shut down for a while, in order to begin the healing process.    Summary: 1. Refer to this scale when providing us with your pain level. 2. Be accurate and careful when reporting your pain level. This will help with your care. 3. Over-reporting your pain level will lead to loss of credibility. 4. Even a level of   1/10 means that there is pain and will be treated at our facility. 5. High, inaccurate reporting will be documented as "Symptom  Exaggeration", leading to loss of credibility and suspicions of possible secondary gains such as obtaining more narcotics, or wanting to appear disabled, for fraudulent reasons. 6. Only pain levels of 5 or below will be seen at our facility. 7. Pain levels of 6 and above will be sent to the Emergency Department and the appointment cancelled. ____________________________________________________________________________________________  Prescription given for hydrocodone and oxycodone x3

## 2017-01-11 LAB — TOXASSURE SELECT 13 (MW), URINE

## 2017-02-05 ENCOUNTER — Ambulatory Visit: Payer: Medicaid Other | Admitting: Pain Medicine

## 2017-02-05 NOTE — Progress Notes (Deleted)
Patient's Name: Tanya [name]  MRN: [id number]  Referring Provider: Ricardo Jericho*  DOB: [birth year]  PCP: Ricardo Jericho, NP  DOS: 02/05/2017  Note by: Gaspar Cola, MD  Service setting: Ambulatory outpatient  Specialty: Interventional Pain Management  Location: [facility] (AMB) Pain Management Facility    Patient type: Established   Primary Reason(s) for Visit: Encounter for post-procedure evaluation of chronic illness with mild to moderate exacerbation CC: No chief complaint on file.  HPI  Tanya [name] is a 57 y.o. year old, female patient, who comes today for a post-procedure evaluation. She has Anxiety; Chronic upper back pain (Secondary source of pain) (Bilateral) (R>L); Chronic discoid lupus erythematosus; Chronic low back pain [facility] source of pain) (Bilateral) (R>L); Chronic neck pain (Primary Source of Pain) (Bilateral) (R>L); Discoid lupus; Diverticulitis; Encounter for long-term (current) use of high-risk medication; Fibromyalgia; Generalized anxiety disorder; Hypercholesterolemia; Hyperlipidemia; Major depressive disorder, recurrent ([place]); Pain medication agreement signed; Osteoarthritis; SLE (systemic lupus erythematosus) ([place]); Urinary incontinence; Long term prescription benzodiazepine use; Long term current use of opiate analgesic; Long term prescription opiate use; Opiate use (10 MME/Day); Chronic pain syndrome; Chronic upper extremity pain (Right); Chronic cervical radicular pain (Right); Chronic shoulder pain (Bilateral) (R>L); 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 Her primarily concern today is the No chief complaint on file.  Pain Assessment: Location:     Radiating:   Onset:   Duration:   Quality:   Severity:  /10 (self-reported pain  score)  Note: Reported level is compatible with observation.                         When using our objective Pain Scale, levels between 6 and 10/10 are said to belong in an emergency room, as it progressively worsens from a 6/10, described as severely limiting, requiring emergency care not usually available at an outpatient pain management facility. At a 6/10 level, communication becomes difficult and requires great effort. Assistance to reach the emergency department may be required. Facial flushing and profuse sweating along with potentially dangerous increases in heart rate and blood pressure will be evident. Effect on ADL:   Timing:   Modifying factors:    Tanya [name] comes in today for post-procedure evaluation after the treatment done on 12/24/2016.  Further details on both, my assessment(s), as well as the proposed treatment plan, please see below.  Post-Procedure Assessment  12/24/2016 Procedure: Therapeutic right-sided cervical facet RFA under fluoroscopic guidance and IV sedation  Pre-procedure pain score:  8/10 Post-procedure pain score: 0/10 (100% relief) Influential Factors: BMI:   Intra-procedural challenges: None observed.         Assessment challenges: None detected.              Reported side-effects: None.        Post-procedural adverse reactions or complications: None reported         Sedation: Sedation provided. When no sedatives are used, the analgesic levels obtained are directly associated to the effectiveness of the local anesthetics. However, when sedation is provided, the level of analgesia obtained during the initial 1 hour following the intervention, is believed to be the result of a combination of factors. These factors may include, but are not limited to: 1. The effectiveness of the local anesthetics used. 2. The effects of the analgesic(s) and/or anxiolytic(s) used. 3.  The degree of discomfort experienced by the patient at the time of the procedure. 4. The  patients ability and reliability in recalling and recording the events. 5. The presence and influence of possible secondary gains and/or psychosocial factors. Reported result: Relief experienced during the 1st hour after the procedure:   (Ultra-Short Term Relief)            Interpretative annotation: Clinically appropriate result. Analgesia during this period is likely to be Local Anesthetic and/or IV Sedative (Analgesic/Anxiolytic) related.          Effects of local anesthetic: The analgesic effects attained during this period are directly associated to the localized infiltration of local anesthetics and therefore cary significant diagnostic value as to the etiological location, or anatomical origin, of the pain. Expected duration of relief is directly dependent on the pharmacodynamics of the local anesthetic used. Long-acting (4-6 hours) anesthetics used.  Reported result: Relief during the next 4 to 6 hour after the procedure:   (Short-Term Relief)            Interpretative annotation: Clinically appropriate result. Analgesia during this period is likely to be Local Anesthetic-related.          Long-term benefit: Defined as the period of time past the expected duration of local anesthetics (1 hour for short-acting and 4-6 hours for long-acting). With the possible exception of prolonged sympathetic blockade from the local anesthetics, benefits during this period are typically attributed to, or associated with, other factors such as analgesic sensory neuropraxia, antiinflammatory effects, or beneficial biochemical changes provided by agents other than the local anesthetics.  Reported result: Extended relief following procedure:   (Long-Term Relief)            Interpretative annotation: Clinically appropriate result. Good relief. No permanent benefit expected. Inflammation plays a part in the etiology to the pain.          Current benefits: Defined as reported results that persistent at this point in  time.   Analgesia: *** %            Function: Somewhat improved ROM: Somewhat improved Interpretative annotation: Recurrence of symptoms. No permanent benefit expected. Effective diagnostic intervention.          Interpretation: Results would suggest a successful diagnostic intervention.                  Plan:  Please see "Plan of Care" for details.        Laboratory Chemistry  Inflammation Markers (CRP: Acute Phase) (ESR: Chronic Phase) Lab Results  Component Value Date   CRP 6.0 (H) 07/11/2016   ESRSEDRATE 7 07/11/2016                 Rheumatology Markers No results found for: RF, ANA, Therisa [place], [facility]              Renal Function Markers Lab Results  Component Value Date   BUN 14 09/08/2016   CREATININE 0.85 09/08/2016   GFRAA >60 09/08/2016   GFRNONAA >60 09/08/2016                 Hepatic Function Markers Lab Results  Component Value Date   AST 21 09/08/2016   ALT 16 09/08/2016   ALBUMIN 4.4 09/08/2016   ALKPHOS 36 (L) 09/08/2016   LIPASE 37 09/08/2016                 Electrolytes Lab Results  Component Value Date   NA 137  09/08/2016   K 3.4 (L) 09/08/2016   CL 102 09/08/2016   CALCIUM 9.2 09/08/2016   MG 2.2 07/11/2016                 Neuropathy Markers Lab Results  Component Value Date   VITAMINB12 310 07/11/2016                 Bone Pathology Markers Lab Results  Component Value Date   25OHVITD1 9.6 (L) 07/11/2016   25OHVITD2 <1.0 07/11/2016   25OHVITD3 9.4 07/11/2016                 Coagulation Parameters Lab Results  Component Value Date   PLT 375 09/08/2016                 Cardiovascular Markers Lab Results  Component Value Date   HGB 13.4 09/08/2016   HCT 40.0 09/08/2016                 CA Markers No results found for: CEA, CA125, LABCA2               Note: Lab results reviewed.  Recent Diagnostic Imaging Results  DG C-Arm 1-60 Min-No Report Fluoroscopy was utilized by the requesting physician.   No radiographic  interpretation.   Complexity Note: Imaging results reviewed. Results shared with Tanya [name], using Layman's terms.                         Meds   Current Outpatient Medications:  .  Cholecalciferol (VITAMIN D3) 2000 units capsule, Take 1 capsule (2,000 Units total) by mouth daily., Disp: 30 capsule, Rfl: PRN .  cyclobenzaprine (FLEXERIL) 10 MG tablet, Take 1 tablet (10 mg total) by mouth 3 (three) times daily as needed for muscle spasms., Disp: 90 tablet, Rfl: 2 .  DULoxetine (CYMBALTA) 30 MG capsule, TAKE 1 CAPSULE(30 MG) BY MOUTH EVERY DAY, Disp: , Rfl:  .  gabapentin (NEURONTIN) 300 MG capsule, Take 1 capsule (300 mg total) by mouth 3 (three) times daily., Disp: 270 capsule, Rfl: 0 .  [START ON 03/11/2017] oxyCODONE (OXY IR/ROXICODONE) 5 MG immediate release tablet, Take 1 tablet (5 mg total) by mouth 2 (two) times daily., Disp: 60 tablet, Rfl: 0 .  [START ON 02/09/2017] oxyCODONE (OXY IR/ROXICODONE) 5 MG immediate release tablet, Take 1 tablet (5 mg total) by mouth 2 (two) times daily., Disp: 60 tablet, Rfl: 0 .  oxyCODONE (OXY IR/ROXICODONE) 5 MG immediate release tablet, Take 1 tablet (5 mg total) by mouth 2 (two) times daily., Disp: 60 tablet, Rfl: 0  ROS  Constitutional: Denies any fever or chills Gastrointestinal: No reported hemesis, hematochezia, vomiting, or acute GI distress Musculoskeletal: Denies any acute onset joint swelling, redness, loss of ROM, or weakness Neurological: No reported episodes of acute onset apraxia, aphasia, dysarthria, agnosia, amnesia, paralysis, loss of coordination, or loss of consciousness  Allergies  Tanya [name] is allergic to trazodone; bupropion; methocarbamol; oxycodone-acetaminophen; tramadol; and trazodone and nefazodone.  [place]  Drug: Tanya [name]  reports that she does not use drugs. Alcohol:  reports that she does not drink alcohol. Tobacco:  reports that she has been smoking cigarettes.  She has been smoking about  0.50 packs per day. she has never used smokeless tobacco. Medical:  has a past medical history of Anxiety, Bladder infection, Depression, Hyperlipidemia, Lupus, and Overactive bladder. Surgical: Tanya [name]  has a past surgical history that includes Abdominal hysterectomy. Family: family history  includes Cancer in her mother; Diabetes in her brother; Emphysema in her mother; Glaucoma in her father; Heart disease in her father; Hypertension in her mother; Stroke in her mother.  Constitutional Exam  General appearance: Well nourished, well developed, and well hydrated. In no apparent acute distress There were no vitals filed for this visit. BMI Assessment: Estimated body mass index is 25.33 kg/m as calculated from the following:   Height as of 01/07/17: 5' 3"  (1.6 m).   Weight as of 01/07/17: 143 lb (64.9 kg).  BMI interpretation table: BMI level Category Range association with higher incidence of chronic pain  <18 kg/m2 Underweight   18.5-24.9 kg/m2 Ideal body weight   25-29.9 kg/m2 Overweight Increased incidence by 20%  30-34.9 kg/m2 Obese (Class I) Increased incidence by 68%  35-39.9 kg/m2 Severe obesity (Class II) Increased incidence by 136%  >40 kg/m2 Extreme obesity (Class III) Increased incidence by 254%   BMI Readings from Last 4 Encounters:  01/07/17 25.33 kg/m  12/24/16 23.91 kg/m  10/08/16 25.30 kg/m  09/08/16 25.69 kg/m   Wt Readings from Last 4 Encounters:  01/07/17 143 lb (64.9 kg)  12/24/16 135 lb (61.2 kg)  10/08/16 142 lb 12.8 oz (64.8 kg)  09/08/16 145 lb (65.8 kg)  Psych/Mental status: Alert, oriented x 3 (person, place, & time)       Eyes: PERLA Respiratory: No evidence of acute respiratory distress  Cervical Spine Area Exam  Skin & Axial Inspection: No masses, redness, edema, swelling, or associated skin lesions Alignment: Symmetrical Functional ROM: Unrestricted ROM      Stability: No instability detected Muscle Tone/Strength: Functionally intact.  No obvious neuro-muscular anomalies detected. Sensory (Neurological): Unimpaired Palpation: No palpable anomalies              Upper Extremity (UE) Exam    Side: Right upper extremity  Side: Left upper extremity  Skin & Extremity Inspection: Skin color, temperature, and hair growth are WNL. No peripheral edema or cyanosis. No masses, redness, swelling, asymmetry, or associated skin lesions. No contractures.  Skin & Extremity Inspection: Skin color, temperature, and hair growth are WNL. No peripheral edema or cyanosis. No masses, redness, swelling, asymmetry, or associated skin lesions. No contractures.  Functional ROM: Unrestricted ROM          Functional ROM: Unrestricted ROM          Muscle Tone/Strength: Functionally intact. No obvious neuro-muscular anomalies detected.  Muscle Tone/Strength: Functionally intact. No obvious neuro-muscular anomalies detected.  Sensory (Neurological): Unimpaired          Sensory (Neurological): Unimpaired          Palpation: No palpable anomalies              Palpation: No palpable anomalies              Specialized Test(s): Deferred         Specialized Test(s): Deferred          Thoracic Spine Area Exam  Skin & Axial Inspection: No masses, redness, or swelling Alignment: Symmetrical Functional ROM: Unrestricted ROM Stability: No instability detected Muscle Tone/Strength: Functionally intact. No obvious neuro-muscular anomalies detected. Sensory (Neurological): Unimpaired Muscle strength & Tone: No palpable anomalies  Lumbar Spine Area Exam  Skin & Axial Inspection: No masses, redness, or swelling Alignment: Symmetrical Functional ROM: Unrestricted ROM      Stability: No instability detected Muscle Tone/Strength: Functionally intact. No obvious neuro-muscular anomalies detected. Sensory (Neurological): Unimpaired Palpation: No palpable anomalies  Provocative Tests: Lumbar Hyperextension and rotation test: evaluation deferred today       Lumbar  Lateral bending test: evaluation deferred today       Patrick's Maneuver: evaluation deferred today                    Gait & Posture Assessment  Ambulation: Unassisted Gait: Relatively normal for age and body habitus Posture: WNL   Lower Extremity Exam    Side: Right lower extremity  Side: Left lower extremity  Skin & Extremity Inspection: Skin color, temperature, and hair growth are WNL. No peripheral edema or cyanosis. No masses, redness, swelling, asymmetry, or associated skin lesions. No contractures.  Skin & Extremity Inspection: Skin color, temperature, and hair growth are WNL. No peripheral edema or cyanosis. No masses, redness, swelling, asymmetry, or associated skin lesions. No contractures.  Functional ROM: Unrestricted ROM          Functional ROM: Unrestricted ROM          Muscle Tone/Strength: Functionally intact. No obvious neuro-muscular anomalies detected.  Muscle Tone/Strength: Functionally intact. No obvious neuro-muscular anomalies detected.  Sensory (Neurological): Unimpaired  Sensory (Neurological): Unimpaired  Palpation: No palpable anomalies  Palpation: No palpable anomalies   Assessment  Primary Diagnosis & Pertinent Problem List: The primary encounter diagnosis was Chronic neck pain (Primary Source of Pain) (Bilateral) (R>L). Diagnoses of Chronic upper back pain (Secondary source of pain) (Bilateral) (R>L), Chronic low back pain [facility] source of pain) (Bilateral) (R>L), and Chronic pain syndrome were also pertinent to this visit.  Status Diagnosis  Controlled Controlled Controlled 1. Chronic neck pain (Primary Source of Pain) (Bilateral) (R>L)   2. Chronic upper back pain (Secondary source of pain) (Bilateral) (R>L)   3. Chronic low back pain [facility] source of pain) (Bilateral) (R>L)   4. Chronic pain syndrome     Problems updated and reviewed during this visit: Problem  Chronic low back pain [facility] source of pain) (Bilateral) (R>L)  Chronic neck pain  (Primary Source of Pain) (Bilateral) (R>L)  Chronic upper back pain (Secondary source of pain) (Bilateral) (R>L)   Last Assessment & Plan:  Back pain continues to be problematic, though I suspect that her coexistent fibromyalgia and depression are exacerbating this. Reassuringly her recent MRI spine only showed DJD and her CT abd/pelvis in 04/2012 showed incidental finding of degenerative changes of the spine also.  She has no alarming symptoms such as weight loss, fevers, weakness or loss of sensation. - We discussed the importance of physical therapy to improve muscle tone and reduce stress from her spine however she declined this today and would rather see orthopedics first. - I will send a referral to orthopedics today for further management - Continue Cymbalta, gabapentin and Valium as prescribed   Anxiety   Overview:  cymbalta and busapr  Overview:  Cymbalta;cannnot tolerate higher dose of Cymbalta. Cannot tolerate hydroxyzine. Failed buspar.   Overview:  Cymbalta;. Cannot tolerate hydroxyzine. Failed buspar.     Plan of Care  Pharmacotherapy (Medications Ordered): No orders of the defined types were placed in this encounter.  Medications administered today: Chai L. Sheldon had no medications administered during this visit.  Procedure Orders    No procedure(s) ordered today   Lab Orders  No laboratory test(s) ordered today   Imaging Orders  No imaging studies ordered today   Referral Orders  No referral(s) requested today    Interventional management options: Planned, scheduled, and/or pending:   ***   Considering:  Diagnostic bilateral intra-articular shoulder joint injection Diagnostic bilateral suprascapular nerve block Possible bilateral suprascapular nerve RFA Right-sided cervical epidural steroid injection(Series #2) Diagnostic bilateral cervical facet block Possible bilateral cervical facet radiofrequencyablation  Diagnostic bilateral lumbar  facet block Possible bilateral lumbar facet radiofrequency ablation    Palliative PRN treatment(s):   Right-sided cervical epidural steroid injection(Series #2)    Provider-requested follow-up: No Follow-up on file.  Future Appointments  Date Time Provider [place]  02/05/2017  1:15 PM Milinda Pointer, MD [facility] None  04/01/2017  1:30 PM Vevelyn Francois, NP [facility] None   Primary Care Physician: Ricardo Jericho, NP Location: [facility] Outpatient Pain Management Facility Note by: Gaspar Cola, MD Date: 02/05/2017; Time: 8:02 AM

## 2017-02-10 ENCOUNTER — Encounter: Payer: Self-pay | Admitting: Pain Medicine

## 2017-02-10 ENCOUNTER — Ambulatory Visit: Payer: Medicaid Other | Attending: Pain Medicine | Admitting: Pain Medicine

## 2017-02-10 VITALS — BP 117/68 | HR 77 | Resp 16 | Ht 63.0 in | Wt 150.0 lb

## 2017-02-10 DIAGNOSIS — M5412 Radiculopathy, cervical region: Secondary | ICD-10-CM | POA: Insufficient documentation

## 2017-02-10 DIAGNOSIS — G8918 Other acute postprocedural pain: Secondary | ICD-10-CM | POA: Diagnosis not present

## 2017-02-10 DIAGNOSIS — Z5181 Encounter for therapeutic drug level monitoring: Secondary | ICD-10-CM | POA: Insufficient documentation

## 2017-02-10 DIAGNOSIS — M546 Pain in thoracic spine: Secondary | ICD-10-CM | POA: Insufficient documentation

## 2017-02-10 DIAGNOSIS — N3281 Overactive bladder: Secondary | ICD-10-CM | POA: Diagnosis not present

## 2017-02-10 DIAGNOSIS — M797 Fibromyalgia: Secondary | ICD-10-CM | POA: Insufficient documentation

## 2017-02-10 DIAGNOSIS — G8929 Other chronic pain: Secondary | ICD-10-CM | POA: Diagnosis not present

## 2017-02-10 DIAGNOSIS — E559 Vitamin D deficiency, unspecified: Secondary | ICD-10-CM | POA: Insufficient documentation

## 2017-02-10 DIAGNOSIS — M25512 Pain in left shoulder: Secondary | ICD-10-CM | POA: Insufficient documentation

## 2017-02-10 DIAGNOSIS — M542 Cervicalgia: Secondary | ICD-10-CM | POA: Diagnosis not present

## 2017-02-10 DIAGNOSIS — Z79891 Long term (current) use of opiate analgesic: Secondary | ICD-10-CM | POA: Insufficient documentation

## 2017-02-10 DIAGNOSIS — M549 Dorsalgia, unspecified: Secondary | ICD-10-CM | POA: Diagnosis not present

## 2017-02-10 DIAGNOSIS — G894 Chronic pain syndrome: Secondary | ICD-10-CM | POA: Diagnosis not present

## 2017-02-10 DIAGNOSIS — E785 Hyperlipidemia, unspecified: Secondary | ICD-10-CM | POA: Diagnosis not present

## 2017-02-10 DIAGNOSIS — M25511 Pain in right shoulder: Secondary | ICD-10-CM | POA: Insufficient documentation

## 2017-02-10 DIAGNOSIS — E7800 Pure hypercholesterolemia, unspecified: Secondary | ICD-10-CM | POA: Diagnosis not present

## 2017-02-10 DIAGNOSIS — Z79899 Other long term (current) drug therapy: Secondary | ICD-10-CM | POA: Insufficient documentation

## 2017-02-10 DIAGNOSIS — M329 Systemic lupus erythematosus, unspecified: Secondary | ICD-10-CM | POA: Diagnosis not present

## 2017-02-10 DIAGNOSIS — F411 Generalized anxiety disorder: Secondary | ICD-10-CM | POA: Insufficient documentation

## 2017-02-10 DIAGNOSIS — M545 Low back pain: Secondary | ICD-10-CM | POA: Diagnosis not present

## 2017-02-10 DIAGNOSIS — F17210 Nicotine dependence, cigarettes, uncomplicated: Secondary | ICD-10-CM | POA: Insufficient documentation

## 2017-02-10 DIAGNOSIS — M47892 Other spondylosis, cervical region: Secondary | ICD-10-CM | POA: Insufficient documentation

## 2017-02-10 DIAGNOSIS — F339 Major depressive disorder, recurrent, unspecified: Secondary | ICD-10-CM | POA: Insufficient documentation

## 2017-02-10 NOTE — Progress Notes (Signed)
Patient's Name: Tanya [name]  MRN: [id number]  Referring Provider: Ricardo Jericho*  DOB: Mar 05, 1960  PCP: Ricardo Jericho, NP  DOS: 02/10/2017  Note by: Gaspar Cola, MD  Service setting: Ambulatory outpatient  Specialty: Interventional Pain Management  Location: [facility] (AMB) Pain Management Facility    Patient type: Established   Primary Reason(s) for Visit: Encounter for post-procedure evaluation of chronic illness with mild to moderate exacerbation CC: Neck Pain  HPI  Tanya [name] is a 57 y.o. year old, female patient, who comes today for a post-procedure evaluation. She has Anxiety; Chronic upper back pain (Secondary source of pain) (Bilateral) (R>L); Chronic discoid lupus erythematosus; Chronic low back pain [facility] source of pain) (Bilateral) (R>L); Chronic neck pain (Primary Source of Pain) (Bilateral) (R>L); Discoid lupus; Diverticulitis; Encounter for long-term (current) use of high-risk medication; Fibromyalgia; Generalized anxiety disorder; Hypercholesterolemia; Hyperlipidemia; Major depressive disorder, recurrent ([place]); Pain medication agreement signed; Osteoarthritis; SLE (systemic lupus erythematosus) ([place]); Urinary incontinence; Long term prescription benzodiazepine use; Long term current use of opiate analgesic; Long term prescription opiate use; Opiate use (10 MME/Day); Chronic pain syndrome; Chronic upper extremity pain (Right); Chronic cervical radicular pain (Right); Chronic shoulder pain (Bilateral) (R>L); 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 Her primarily concern today is the Neck Pain  Pain Assessment: Location: Left, Right Neck Radiating: into back and shoulders Onset: More than a month ago Duration: Chronic pain Quality: Pins and  needles Severity: 0-No pain/10 (self-reported pain score)  Note: Reported level is compatible with observation.                               Timing: Intermittent Modifying factors: procedures   Tanya [name] comes in today for post-procedure evaluation after the treatment done on 12/24/2016.  Further details on both, my assessment(s), as well as the proposed treatment plan, please see below.  Post-Procedure Assessment  12/24/2016 Procedure: Therapeutic right-sided cervical facet RFA under fluoroscopic guidance and IV sedation  Pre-procedure pain score:  8/10 Post-procedure pain score: 0/10 (100% relief) Influential Factors: BMI: 26.57 kg/m Intra-procedural challenges: None observed.         Assessment challenges: None detected.              Reported side-effects: None.        Post-procedural adverse reactions or complications: None reported         Sedation: Sedation provided. When no sedatives are used, the analgesic levels obtained are directly associated to the effectiveness of the local anesthetics. However, when sedation is provided, the level of analgesia obtained during the initial 1 hour following the intervention, is believed to be the result of a combination of factors. These factors may include, but are not limited to: 1. The effectiveness of the local anesthetics used. 2. The effects of the analgesic(s) and/or anxiolytic(s) used. 3. The degree of discomfort experienced by the patient at the time of the procedure. 4. The patients ability and reliability in recalling and recording the events. 5. The presence and influence of possible secondary gains and/or psychosocial factors. Reported result: Relief experienced during the 1st hour after the procedure: 100 % (Ultra-Short Term Relief) Tanya [name] has indicated area to have been numb during this time. Interpretative annotation: Clinically appropriate result. Analgesia during this period is likely to be Local Anesthetic and/or  IV Sedative (Analgesic/Anxiolytic) related.          Effects of local anesthetic: The analgesic effects attained during this period are directly associated to the localized infiltration of local anesthetics and therefore cary significant diagnostic value as to the etiological location, or anatomical origin, of the pain. Expected duration of relief is directly dependent on the pharmacodynamics of the local anesthetic used. Long-acting (4-6 hours) anesthetics used.  Reported result: Relief during the next 4 to 6 hour after the procedure: 100 % (Short-Term Relief) Tanya [name] has indicated area to have been numb during this time. Interpretative annotation: Clinically appropriate result. Analgesia during this period is likely to be Local Anesthetic-related.          Long-term benefit: Defined as the period of time past the expected duration of local anesthetics (1 hour for short-acting and 4-6 hours for long-acting). With the possible exception of prolonged sympathetic blockade from the local anesthetics, benefits during this period are typically attributed to, or associated with, other factors such as analgesic sensory neuropraxia, antiinflammatory effects, or beneficial biochemical changes provided by agents other than the local anesthetics.  Reported result: Extended relief following procedure: 100 % (Long-Term Relief)            Interpretative annotation: Clinically appropriate result. Good relief. No permanent benefit expected. Inflammation plays a part in the etiology to the pain. Benefit could signal adequate RF ablation.  Current benefits: Defined as reported results that persistent at this point in time.   Analgesia: 100 % [name] reports improvement of axial symptoms. Function: Tanya [name] reports improvement in function ROM: Tanya [name] reports improvement in ROM Interpretative annotation: Ongoing benefit.    Effective therapeutic approach. Benefit could signal adequate RF  ablation.  Interpretation: Results would suggest a successful therapeutic intervention.                  Plan:  Set up procedure as a PRN palliative treatment option for this patient.        Laboratory Chemistry  Inflammation Markers (CRP: Acute Phase) (ESR: Chronic Phase) Lab Results  Component Value Date   CRP 6.0 (H) 07/11/2016   ESRSEDRATE 7 07/11/2016                 Rheumatology Markers No results found for: RF, ANA, Therisa Doyne, [facility]              Renal Function Markers Lab Results  Component Value Date   BUN 14 09/08/2016   CREATININE 0.85 09/08/2016   GFRAA >60 09/08/2016   GFRNONAA >60 09/08/2016                 Hepatic Function Markers Lab Results  Component Value Date   AST 21 09/08/2016   ALT 16 09/08/2016   ALBUMIN 4.4 09/08/2016   ALKPHOS 36 (L) 09/08/2016   LIPASE 37 09/08/2016                 Electrolytes Lab Results  Component Value Date   NA 137 09/08/2016   K 3.4 (L) 09/08/2016   CL 102 09/08/2016   CALCIUM 9.2 09/08/2016   MG 2.2 07/11/2016                 Neuropathy Markers Lab Results  Component Value Date   VITAMINB12 310 07/11/2016                 Bone Pathology Markers Lab Results  Component Value Date  25OHVITD1 9.6 (L) 07/11/2016   25OHVITD2 <1.0 07/11/2016   25OHVITD3 9.4 07/11/2016                 Coagulation Parameters Lab Results  Component Value Date   PLT 375 09/08/2016                 Cardiovascular Markers Lab Results  Component Value Date   HGB 13.4 09/08/2016   HCT 40.0 09/08/2016                 CA Markers No results found for: CEA, CA125, LABCA2               Note: Lab results reviewed.  Recent Diagnostic Imaging Results  DG C-Arm 1-60 Min-No Report Fluoroscopy was utilized by the requesting physician.  No radiographic  interpretation.   Complexity Note: Imaging results reviewed. Results shared with Ms. Koslowski, using Layman's terms.                         Meds    Current Outpatient Medications:  .  Cholecalciferol (VITAMIN D3) 2000 units capsule, Take 1 capsule (2,000 Units total) by mouth daily., Disp: 30 capsule, Rfl: PRN .  cyclobenzaprine (FLEXERIL) 10 MG tablet, Take 1 tablet (10 mg total) by mouth 3 (three) times daily as needed for muscle spasms., Disp: 90 tablet, Rfl: 2 .  DULoxetine (CYMBALTA) 30 MG capsule, TAKE 1 CAPSULE(30 MG) BY MOUTH EVERY DAY, Disp: , Rfl:  .  gabapentin (NEURONTIN) 300 MG capsule, Take 1 capsule (300 mg total) by mouth 3 (three) times daily., Disp: 270 capsule, Rfl: 0 .  [START ON 03/11/2017] oxyCODONE (OXY IR/ROXICODONE) 5 MG immediate release tablet, Take 1 tablet (5 mg total) by mouth 2 (two) times daily., Disp: 60 tablet, Rfl: 0 .  oxyCODONE (OXY IR/ROXICODONE) 5 MG immediate release tablet, Take 1 tablet (5 mg total) by mouth 2 (two) times daily., Disp: 60 tablet, Rfl: 0 .  oxyCODONE (OXY IR/ROXICODONE) 5 MG immediate release tablet, Take 1 tablet (5 mg total) by mouth 2 (two) times daily., Disp: 60 tablet, Rfl: 0  ROS  Constitutional: Denies any fever or chills Gastrointestinal: No reported hemesis, hematochezia, vomiting, or acute GI distress Musculoskeletal: Denies any acute onset joint swelling, redness, loss of ROM, or weakness Neurological: No reported episodes of acute onset apraxia, aphasia, dysarthria, agnosia, amnesia, paralysis, loss of coordination, or loss of consciousness  Allergies  Ms. Palmatier is allergic to trazodone; bupropion; methocarbamol; oxycodone-acetaminophen; tramadol; and trazodone and nefazodone.  [place]  Drug: Ms. Urbanik  reports that she does not use drugs. Alcohol:  reports that she does not drink alcohol. Tobacco:  reports that she has been smoking cigarettes.  She has been smoking about 0.50 packs per day. she has never used smokeless tobacco. Medical:  has a past medical history of Anxiety, Bladder infection, Depression, Hyperlipidemia, Lupus, and Overactive  bladder. Surgical: Ms. Stryker  has a past surgical history that includes Abdominal hysterectomy. Family: family history includes Cancer in her mother; Diabetes in her brother; Emphysema in her mother; Glaucoma in her father; Heart disease in her father; Hypertension in her mother; Stroke in her mother.  Constitutional Exam  General appearance: Well nourished, well developed, and well hydrated. In no apparent acute distress Vitals:   02/10/17 0811  BP: 117/68  Pulse: 77  Resp: 16  SpO2: 100%  Weight: 150 lb (68 kg)  Height: '5\' 3"'$  (1.6 m)   BMI Assessment:  Estimated body mass index is 26.57 kg/m as calculated from the following:   Height as of this encounter: _0  (1.6 m).   Weight as of this encounter: 150 lb (68 kg).  BMI interpretation table: BMI level Category Range association with higher incidence of chronic pain  <18 kg/m2 Underweight   18.5-24.9 kg/m2 Ideal body weight   25-29.9 kg/m2 Overweight Increased incidence by 20%  30-34.9 kg/m2 Obese (Class I) Increased incidence by 68%  35-39.9 kg/m2 Severe obesity (Class II) Increased incidence by 136%  >40 kg/m2 Extreme obesity (Class III) Increased incidence by 254%   BMI Readings from Last 4 Encounters:  02/10/17 26.57 kg/m  01/07/17 25.33 kg/m  12/24/16 23.91 kg/m  10/08/16 25.30 kg/m   Wt Readings from Last 4 Encounters:  02/10/17 150 lb (68 kg)  01/07/17 143 lb (64.9 kg)  12/24/16 135 lb (61.2 kg)  10/08/16 142 lb 12.8 oz (64.8 kg)  Psych/Mental status: Alert, oriented x 3 (person, place, & time)       Eyes: PERLA Respiratory: No evidence of acute respiratory distress  Cervical Spine Area Exam  Skin & Axial Inspection: No masses, redness, edema, swelling, or associated skin lesions Alignment: Symmetrical Functional ROM: Improved after treatment      Stability: No instability detected Muscle Tone/Strength: Functionally intact. No obvious neuro-muscular anomalies detected. Sensory (Neurological):  Unimpaired Palpation: No palpable anomalies              Upper Extremity (UE) Exam    Side: Right upper extremity  Side: Left upper extremity  Skin & Extremity Inspection: Skin color, temperature, and hair growth are WNL. No peripheral edema or cyanosis. No masses, redness, swelling, asymmetry, or associated skin lesions. No contractures.  Skin & Extremity Inspection: Skin color, temperature, and hair growth are WNL. No peripheral edema or cyanosis. No masses, redness, swelling, asymmetry, or associated skin lesions. No contractures.  Functional ROM: Unrestricted ROM          Functional ROM: Unrestricted ROM          Muscle Tone/Strength: Functionally intact. No obvious neuro-muscular anomalies detected.  Muscle Tone/Strength: Functionally intact. No obvious neuro-muscular anomalies detected.  Sensory (Neurological): Unimpaired          Sensory (Neurological): Unimpaired          Palpation: No palpable anomalies              Palpation: No palpable anomalies              Specialized Test(s): Deferred         Specialized Test(s): Deferred          Thoracic Spine Area Exam  Skin & Axial Inspection: No masses, redness, or swelling Alignment: Symmetrical Functional ROM: Unrestricted ROM Stability: No instability detected Muscle Tone/Strength: Functionally intact. No obvious neuro-muscular anomalies detected. Sensory (Neurological): Unimpaired Muscle strength & Tone: No palpable anomalies  Lumbar Spine Area Exam  Skin & Axial Inspection: No masses, redness, or swelling Alignment: Symmetrical Functional ROM: Unrestricted ROM      Stability: No instability detected Muscle Tone/Strength: Functionally intact. No obvious neuro-muscular anomalies detected. Sensory (Neurological): Unimpaired Palpation: No palpable anomalies       Provocative Tests: Lumbar Hyperextension and rotation test: evaluation deferred today       Lumbar Lateral bending test: evaluation deferred today       Patrick's  Maneuver: evaluation deferred today  Gait & Posture Assessment  Ambulation: Unassisted Gait: Relatively normal for age and body habitus Posture: WNL   Lower Extremity Exam    Side: Right lower extremity  Side: Left lower extremity  Skin & Extremity Inspection: Skin color, temperature, and hair growth are WNL. No peripheral edema or cyanosis. No masses, redness, swelling, asymmetry, or associated skin lesions. No contractures.  Skin & Extremity Inspection: Skin color, temperature, and hair growth are WNL. No peripheral edema or cyanosis. No masses, redness, swelling, asymmetry, or associated skin lesions. No contractures.  Functional ROM: Unrestricted ROM          Functional ROM: Unrestricted ROM          Muscle Tone/Strength: Functionally intact. No obvious neuro-muscular anomalies detected.  Muscle Tone/Strength: Functionally intact. No obvious neuro-muscular anomalies detected.  Sensory (Neurological): Unimpaired  Sensory (Neurological): Unimpaired  Palpation: No palpable anomalies  Palpation: No palpable anomalies   Assessment  Primary Diagnosis & Pertinent Problem List: The primary encounter diagnosis was Chronic neck pain (Primary Source of Pain) (Bilateral) (R>L). Diagnoses of Chronic upper back pain (Secondary source of pain) (Bilateral) (R>L) and Chronic low back pain [facility] source of pain) (Bilateral) (R>L) were also pertinent to this visit.  Status Diagnosis  Resolved Resolved Controlled 1. Chronic neck pain (Primary Source of Pain) (Bilateral) (R>L)   2. Chronic upper back pain (Secondary source of pain) (Bilateral) (R>L)   3. Chronic low back pain [facility] source of pain) (Bilateral) (R>L)     Problems updated and reviewed during this visit: No problems updated. Plan of Care  Pharmacotherapy (Medications Ordered): No orders of the defined types were placed in this encounter.  Medications administered today: Katrenia L. Cuyler had no medications  administered during this visit.  Procedure Orders    No procedure(s) ordered today   Lab Orders  No laboratory test(s) ordered today   Imaging Orders  No imaging studies ordered today   Referral Orders  No referral(s) requested today    Interventional management options: Planned, scheduled, and/or pending:   None at this time.   Considering:   Diagnostic bilateral intra-articular shoulder joint injection Diagnostic bilateral suprascapular nerve block Possible bilateral suprascapular nerve RFA Right-sided cervical epidural steroid injection(Series #2) Diagnostic bilateral cervical facet block Possible bilateral cervical facet radiofrequencyablation  Diagnostic bilateral lumbar facet block Possible bilateral lumbar facet radiofrequency ablation    Palliative PRN treatment(s):   Right-sided cervical epidural steroid injection(Series #2)  Palliative/therapeutic right-sided cervical facet RFA #2 under fluoroscopic guidance and IV sedation    Provider-requested follow-up: Return for Med-Mgmt, w/ Dionisio David, NP.  Future Appointments  Date Time Provider [place]  04/01/2017  1:30 PM Vevelyn Francois, NP [facility] None   Primary Care Physician: Ricardo Jericho, NP Location: [facility] Outpatient Pain Management Facility Note by: Gaspar Cola, MD Date: 02/10/2017; Time: 8:23 AM

## 2017-04-01 ENCOUNTER — Ambulatory Visit: Payer: Medicaid Other | Admitting: Nurse Practitioner

## 2017-04-03 ENCOUNTER — Encounter: Payer: Medicaid Other | Admitting: Nurse Practitioner

## 2017-04-08 ENCOUNTER — Ambulatory Visit: Payer: Medicaid Other | Attending: Nurse Practitioner | Admitting: Nurse Practitioner

## 2017-04-08 ENCOUNTER — Encounter: Payer: Self-pay | Admitting: Nurse Practitioner

## 2017-04-08 ENCOUNTER — Other Ambulatory Visit: Payer: Self-pay

## 2017-04-08 VITALS — BP 117/97 | HR 86 | Temp 97.6°F | Resp 16 | Ht 63.0 in | Wt 145.0 lb

## 2017-04-08 DIAGNOSIS — M47812 Spondylosis without myelopathy or radiculopathy, cervical region: Secondary | ICD-10-CM | POA: Diagnosis not present

## 2017-04-08 DIAGNOSIS — E7800 Pure hypercholesterolemia, unspecified: Secondary | ICD-10-CM | POA: Diagnosis not present

## 2017-04-08 DIAGNOSIS — M542 Cervicalgia: Secondary | ICD-10-CM

## 2017-04-08 DIAGNOSIS — M479 Spondylosis, unspecified: Secondary | ICD-10-CM | POA: Insufficient documentation

## 2017-04-08 DIAGNOSIS — M25511 Pain in right shoulder: Secondary | ICD-10-CM | POA: Insufficient documentation

## 2017-04-08 DIAGNOSIS — R32 Unspecified urinary incontinence: Secondary | ICD-10-CM | POA: Diagnosis not present

## 2017-04-08 DIAGNOSIS — E559 Vitamin D deficiency, unspecified: Secondary | ICD-10-CM | POA: Diagnosis not present

## 2017-04-08 DIAGNOSIS — M25512 Pain in left shoulder: Secondary | ICD-10-CM | POA: Insufficient documentation

## 2017-04-08 DIAGNOSIS — M7918 Myalgia, other site: Secondary | ICD-10-CM

## 2017-04-08 DIAGNOSIS — M797 Fibromyalgia: Secondary | ICD-10-CM | POA: Insufficient documentation

## 2017-04-08 DIAGNOSIS — M5412 Radiculopathy, cervical region: Secondary | ICD-10-CM | POA: Insufficient documentation

## 2017-04-08 DIAGNOSIS — G894 Chronic pain syndrome: Secondary | ICD-10-CM

## 2017-04-08 DIAGNOSIS — Z5181 Encounter for therapeutic drug level monitoring: Secondary | ICD-10-CM | POA: Diagnosis present

## 2017-04-08 DIAGNOSIS — Z79899 Other long term (current) drug therapy: Secondary | ICD-10-CM | POA: Insufficient documentation

## 2017-04-08 DIAGNOSIS — Z888 Allergy status to other drugs, medicaments and biological substances status: Secondary | ICD-10-CM | POA: Diagnosis not present

## 2017-04-08 DIAGNOSIS — G8929 Other chronic pain: Secondary | ICD-10-CM | POA: Diagnosis not present

## 2017-04-08 DIAGNOSIS — F411 Generalized anxiety disorder: Secondary | ICD-10-CM | POA: Diagnosis not present

## 2017-04-08 DIAGNOSIS — M329 Systemic lupus erythematosus, unspecified: Secondary | ICD-10-CM | POA: Diagnosis not present

## 2017-04-08 DIAGNOSIS — Z79891 Long term (current) use of opiate analgesic: Secondary | ICD-10-CM | POA: Diagnosis not present

## 2017-04-08 DIAGNOSIS — F17210 Nicotine dependence, cigarettes, uncomplicated: Secondary | ICD-10-CM | POA: Diagnosis not present

## 2017-04-08 MED ORDER — OXYCODONE HCL 5 MG PO TABS: 5.0000 mg | ORAL_TABLET | Freq: Two times a day (BID) | ORAL | 0 refills | Status: DC

## 2017-04-08 MED ORDER — CYCLOBENZAPRINE HCL 10 MG PO TABS: 10.0000 mg | ORAL_TABLET | Freq: Three times a day (TID) | ORAL | 2 refills | Status: DC | PRN

## 2017-04-08 MED ORDER — GABAPENTIN 300 MG PO CAPS: 300.0000 mg | ORAL_CAPSULE | Freq: Three times a day (TID) | ORAL | 0 refills | Status: DC

## 2017-04-08 NOTE — Patient Instructions (Addendum)
You have been given 3 Rx for Oxycodone IR to last until 07/09/17. Rx for gabapentin and flexeril have been escribed to your pharmacy.____________________________________________________________________________________________  Medication Rules  Applies to: All patients receiving prescriptions (written or electronic).  Pharmacy of record: Pharmacy where electronic prescriptions will be sent. If written prescriptions are taken to a different pharmacy, please inform the nursing staff. The pharmacy listed in the electronic medical record should be the one where you would like electronic prescriptions to be sent.  Prescription refills: Only during scheduled appointments. Applies to both, written and electronic prescriptions.  NOTE: The following applies primarily to controlled substances (Opioid* Pain Medications).   Patient's responsibilities: 1. Pain Pills: Bring all pain pills to every appointment (except for procedure appointments). 2. Pill Bottles: Bring pills in original pharmacy bottle. Always bring newest bottle. Bring bottle, even if empty. 3. Medication refills: You are responsible for knowing and keeping track of what medications you need refilled. The day before your appointment, write a list of all prescriptions that need to be refilled. Bring that list to your appointment and give it to the admitting nurse. Prescriptions will be written only during appointments. If you forget a medication, it will not be "Called in", "Faxed", or "electronically sent". You will need to get another appointment to get these prescribed. 4. Prescription Accuracy: You are responsible for carefully inspecting your prescriptions before leaving our office. Have the discharge nurse carefully go over each prescription with you, before taking them home. Make sure that your name is accurately spelled, that your address is correct. Check the name and dose of your medication to make sure it is accurate. Check the number of  pills, and the written instructions to make sure they are clear and accurate. Make sure that you are given enough medication to last until your next medication refill appointment. 5. Taking Medication: Take medication as prescribed. Never take more pills than instructed. Never take medication more frequently than prescribed. Taking less pills or less frequently is permitted and encouraged, when it comes to controlled substances (written prescriptions).  6. Inform other Doctors: Always inform, all of your healthcare providers, of all the medications you take. 7. Pain Medication from other Providers: You are not allowed to accept any additional pain medication from any other Doctor or Healthcare provider. There are two exceptions to this rule. (see below) In the event that you require additional pain medication, you are responsible for notifying us, as stated below. 8. Medication Agreement: You are responsible for carefully reading and following our Medication Agreement. This must be signed before receiving any prescriptions from our practice. Safely store a copy of your signed Agreement. Violations to the Agreement will result in no further prescriptions. (Additional copies of our Medication Agreement are available upon request.) 9. Laws, Rules, & Regulations: All patients are expected to follow all Federal and [employer], [employer], Rules, [employer]. Ignorance of the Laws does not constitute a valid excuse. The use of any illegal substances is prohibited. 10. Adopted CDC guidelines & recommendations: Target dosing levels will be at or below 60 MME/day. Use of benzodiazepines** is not recommended.  Exceptions: There are only two exceptions to the rule of not receiving pain medications from other Healthcare Providers. 1. Exception #1 (Emergencies): In the event of an emergency (i.e.: accident requiring emergency care), you are allowed to receive additional pain medication. However, you are responsible for:  As soon as you are able, call our office [phone], at any time of the day or  night, and leave a message stating your name, the date and nature of the emergency, and the name and dose of the medication prescribed. In the event that your call is answered by a member of our staff, make sure to document and save the date, time, and the name of the person that took your information.  2. Exception #2 (Planned Surgery): In the event that you are scheduled by another doctor or dentist to have any type of surgery or procedure, you are allowed (for a period no longer than 30 days), to receive additional pain medication, for the acute post-op pain. However, in this case, you are responsible for picking up a copy of our "Post-op Pain Management for Surgeons" handout, and giving it to your surgeon or dentist. This document is available at our office, and does not require an appointment to obtain it. Simply go to our office during business hours (Monday-Thursday from 8:00 AM to 4:00 PM) (Friday 8:00 AM to 12:00 Noon) or if you have a scheduled appointment with [place], prior to your surgery, and ask for it by name. In addition, you will need to provide [place] with your name, name of your surgeon, type of surgery, and date of procedure or surgery.  *Opioid medications include: morphine, codeine, oxycodone, oxymorphone, hydrocodone, hydromorphone, meperidine, tramadol, tapentadol, buprenorphine, fentanyl, methadone. **Benzodiazepine medications include: diazepam (Valium), alprazolam (Xanax), clonazepam (Klonopine), lorazepam (Ativan), clorazepate (Tranxene), chlordiazepoxide (Librium), estazolam (Prosom), oxazepam (Serax), temazepam (Restoril), triazolam (Halcion) (Last updated: 03/20/2017) ____________________________________________________________________________________________

## 2017-04-08 NOTE — Progress Notes (Signed)
Nursing Pain Medication Assessment:  Safety precautions to be maintained throughout the outpatient stay will include: orient to surroundings, keep bed in low position, maintain call bell within reach at all times, provide assistance with transfer out of bed and ambulation.  Medication Inspection Compliance: Pill count conducted under aseptic conditions, in front of the patient. Neither the pills nor the bottle was removed from the patient's sight at any time. Once count was completed pills were immediately returned to the patient in their original bottle.  Medication: Oxycodone IR Pill/Patch Count: 0 of 60 pills remain Pill/Patch Appearance: Markings consistent with prescribed medication Bottle Appearance: Standard pharmacy container. Clearly labeled. Filled Date: 2 / 21 / 2019 Last Medication intake:  Day before yesterday

## 2017-04-08 NOTE — Progress Notes (Signed)
Patient's Name: Tanya [name]  MRN: [id number]  Referring Provider: Ricardo Jericho*  DOB: 1960-12-03  PCP: Ricardo Jericho, NP  DOS: 04/08/2017  Note by: Vevelyn Francois NP  Service setting: Ambulatory outpatient  Specialty: Interventional Pain Management  Location: [facility] (AMB) Pain Management Facility    Patient type: Established    Primary Reason(s) for Visit: Encounter for prescription drug management & post-procedure evaluation of chronic illness with mild to moderate exacerbation(Level of risk: moderate) CC: Neck Pain (left)  HPI  Tanya [name] is a 57 y.o. year old, female patient, who comes today for a post-procedure evaluation and medication management. She has Anxiety; Chronic upper back pain (Secondary source of pain) (Bilateral) (R>L); Chronic discoid lupus erythematosus; Chronic low back pain [facility] source of pain) (Bilateral) (R>L); Chronic neck pain (Primary Source of Pain) (Bilateral) (R>L); Discoid lupus; Diverticulitis; Encounter for long-term (current) use of high-risk medication; Fibromyalgia; Generalized anxiety disorder; Hypercholesterolemia; Hyperlipidemia; Major depressive disorder, recurrent ([place]); Pain medication agreement signed; Osteoarthritis; SLE (systemic lupus erythematosus) ([place]); Urinary incontinence; Long term prescription benzodiazepine use; Long term current use of opiate analgesic; Long term prescription opiate use; Opiate use (10 MME/Day); Chronic pain syndrome; Chronic upper extremity pain (Right); Chronic cervical radicular pain (Right); Chronic shoulder pain (Bilateral) (R>L); 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 Her primarily concern today is the Neck Pain (left)  Pain Assessment: Location: Left, Posterior Neck Radiating:  moves down to left shoulder blade area Onset: More than a month ago Duration: Chronic pain Quality: Burning, Aching, Constant Severity: 4 /10 (self-reported pain score)  Note: Reported level is compatible with observation.                          Effect on ADL: required to rest often Timing: Constant Modifying factors: medications, rest  Tanya [name] was last seen on 01/07/2017 for a procedure. During today's appointment we reviewed Tanya [name]'s post-procedure results, as well as her outpatient medication regimen. She states that she is having nerve pain now since her RFA. She states that this occurred after the burning. She feels like she has some scabs or rash on her skin. She does not feel like it was a effective as the facet blocks  Further details on both, my assessment(s), as well as the proposed treatment plan, please see below.  Controlled Substance Pharmacotherapy Assessment REMS (Risk Evaluation and Mitigation Strategy)  Analgesic:Oxycodone IR 5 mg 1 tablet by mouth twice a day (10 mg/dayof oxycodone). MME/day:38m/day.   WRise [name] RN  04/08/2017 11:02 AM  Sign at close encounter Nursing Pain Medication Assessment:  Safety precautions to be maintained throughout the outpatient stay will include: orient to surroundings, keep bed in low position, maintain call bell within reach at all times, provide assistance with transfer out of bed and ambulation.  Medication Inspection Compliance: Pill count conducted under aseptic conditions, in front of the patient. Neither the pills nor the bottle was removed from the patient's sight at any time. Once count was completed pills were immediately returned to the patient in their original bottle.  Medication: Oxycodone IR Pill/Patch Count: 0 of 60 pills remain Pill/Patch Appearance: Markings consistent with prescribed medication Bottle Appearance: Standard pharmacy container. Clearly labeled. Filled Date: 2 / 21 / 2019 Last  Medication intake:  Day before yesterday   Pharmacokinetics: Liberation and absorption (  onset of action): WNL Distribution (time to peak effect): WNL Metabolism and excretion (duration of action): WNL         Pharmacodynamics: Desired effects: Analgesia: Tanya [name] reports >50% benefit. Functional ability: Patient reports that medication allows her to accomplish basic ADLs Clinically meaningful improvement in function (CMIF): Sustained CMIF goals met Perceived effectiveness: Described as relatively effective, allowing for increase in activities of daily living (ADL) Undesirable effects: Side-effects or Adverse reactions: None reported Monitoring: [place] PMP: Online review of the past 52-monthperiod conducted. Compliant with practice rules and regulations Last UDS on record: Summary  Date Value Ref Range Status  01/07/2017 FINAL  Final    Comment:    ==================================================================== TOXASSURE SELECT 13 (MW) ==================================================================== Specimen Alert Note:  Urinary creatinine is low; ability to detect some drugs may be compromised.  Interpret results with caution. ==================================================================== Test                             Result       Flag       Units Drug Absent but Declared for Prescription Verification   Hydrocodone                    Not Detected UNEXPECTED ng/mg creat   Oxycodone                      Not Detected UNEXPECTED ng/mg creat ==================================================================== Test                      Result    Flag   Units      Ref Range   Creatinine              19        L      mg/dL      >=20 ==================================================================== Declared Medications:  The flagging and interpretation on this report are based on the  following declared medications.  Unexpected results may arise from   inaccuracies in the declared medications.  **Note: The testing scope of this panel includes these medications:  Hydrocodone (Hydrocodone-Acetaminophen)  Oxycodone  **Note: The testing scope of this panel does not include following  reported medications:  Acetaminophen (Hydrocodone-Acetaminophen)  Cholecalciferol  Cyclobenzaprine  Duloxetine  Gabapentin ==================================================================== For clinical consultation, please call ([phone] ====================================================================    UDS interpretation: Compliant          Medication Assessment Form: Reviewed. Patient indicates being compliant with therapy Treatment compliance: Compliant Risk Assessment Profile: Aberrant behavior: See prior evaluations. None observed or detected today Comorbid factors increasing risk of overdose: See prior notes. No additional risks detected today Risk of substance use disorder (SUD): Low Opioid Risk Tool - 04/08/17 1059      Family History of Substance Abuse   Alcohol  Negative    Illegal Drugs  Negative    Rx Drugs  Negative      Personal History of Substance Abuse   Alcohol  Negative    Illegal Drugs  Negative    Rx Drugs  Negative      Age   Age between 175-45years   No      Psychological Disease   Psychological Disease  Negative    Depression  Positive      Total Score   Opioid Risk Tool Scoring  1    Opioid Risk Interpretation  Low Risk  ORT Scoring interpretation table:  Score <3 = Low Risk for SUD  Score between 4-7 = Moderate Risk for SUD  Score >8 = High Risk for Opioid Abuse   Risk Mitigation Strategies:  Patient Counseling: Covered Patient-Prescriber Agreement (PPA): Present and active  Notification to other healthcare providers: Done  Pharmacologic Plan: No change in therapy, at this time.              Laboratory Chemistry  Inflammation Markers (CRP: Acute Phase) (ESR: Chronic Phase) Lab  Results  Component Value Date   CRP 6.0 (H) 07/11/2016   ESRSEDRATE 7 07/11/2016                         Rheumatology Markers No results found for: Elayne Guerin, [facility]              Renal Function Markers Lab Results  Component Value Date   BUN 14 09/08/2016   CREATININE 0.85 09/08/2016   GFRAA >60 09/08/2016   GFRNONAA >60 09/08/2016                 Hepatic Function Markers Lab Results  Component Value Date   AST 21 09/08/2016   ALT 16 09/08/2016   ALBUMIN 4.4 09/08/2016   ALKPHOS 36 (L) 09/08/2016   LIPASE 37 09/08/2016                 Electrolytes Lab Results  Component Value Date   NA 137 09/08/2016   K 3.4 (L) 09/08/2016   CL 102 09/08/2016   CALCIUM 9.2 09/08/2016   MG 2.2 07/11/2016                        Neuropathy Markers Lab Results  Component Value Date   VITAMINB12 310 07/11/2016                 Bone Pathology Markers Lab Results  Component Value Date   25OHVITD1 9.6 (L) 07/11/2016   25OHVITD2 <1.0 07/11/2016   25OHVITD3 9.4 07/11/2016                         Coagulation Parameters Lab Results  Component Value Date   PLT 375 09/08/2016                 Cardiovascular Markers Lab Results  Component Value Date   HGB 13.4 09/08/2016   HCT 40.0 09/08/2016                 CA Markers No results found for: CEA, CA125, LABCA2               Note: Lab results reviewed.  Recent Diagnostic Imaging Results  DG C-Arm 1-60 Min-No Report Fluoroscopy was utilized by the requesting physician.  No radiographic  interpretation.   Complexity Note: Imaging results reviewed. Results shared with Tanya [name], using Layman's terms.                         Meds   Current Outpatient Medications:  .  cyclobenzaprine (FLEXERIL) 10 MG tablet, Take 1 tablet (10 mg total) by mouth 3 (three) times daily as needed for muscle spasms., Disp: 90 tablet, Rfl: 2 .  DULoxetine (CYMBALTA) 30 MG capsule, TAKE 1 CAPSULE(30 MG) BY  MOUTH EVERY DAY, Disp: , Rfl:  .  gabapentin (NEURONTIN) 300 MG capsule, Take 1  capsule (300 mg total) by mouth 3 (three) times daily., Disp: 270 capsule, Rfl: 0 .  [START ON 05/10/2017] oxyCODONE (OXY IR/ROXICODONE) 5 MG immediate release tablet, Take 1 tablet (5 mg total) by mouth 2 (two) times daily., Disp: 60 tablet, Rfl: 0 .  [START ON 04/10/2017] oxyCODONE (OXY IR/ROXICODONE) 5 MG immediate release tablet, Take 1 tablet (5 mg total) by mouth 2 (two) times daily., Disp: 60 tablet, Rfl: 0 .  [START ON 06/09/2017] oxyCODONE (OXY IR/ROXICODONE) 5 MG immediate release tablet, Take 1 tablet (5 mg total) by mouth 2 (two) times daily., Disp: 60 tablet, Rfl: 0  ROS  Constitutional: Denies any fever or chills Gastrointestinal: No reported hemesis, hematochezia, vomiting, or acute GI distress Musculoskeletal: Denies any acute onset joint swelling, redness, loss of ROM, or weakness Neurological: No reported episodes of acute onset apraxia, aphasia, dysarthria, agnosia, amnesia, paralysis, loss of coordination, or loss of consciousness  Allergies  Tanya [name] is allergic to trazodone; bupropion; methocarbamol; oxycodone-acetaminophen; tramadol; and trazodone and nefazodone.  Tanya [name]  Drug: Tanya [name]  reports that she does not use drugs. Alcohol:  reports that she does not drink alcohol. Tobacco:  reports that she has been smoking cigarettes.  She has been smoking about 0.50 packs per day. she has never used smokeless tobacco. Medical:  has a past medical history of Anxiety, Bladder infection, Depression, Hyperlipidemia, Lupus, and Overactive bladder. Surgical: Tanya [name]  has a past surgical history that includes Abdominal hysterectomy. Family: family history includes Cancer in her mother; Diabetes in her brother; Emphysema in her mother; Glaucoma in her father; Heart disease in her father; Hypertension in her mother; Stroke in her mother.  Constitutional Exam  General appearance: alert and  cooperative Vitals:   04/08/17 1050  BP: (!) 117/97  Pulse: 86  Resp: 16  Temp: 97.6 F (36.4 C)  TempSrc: Oral  SpO2: 100%  Weight: 145 lb (65.8 kg)  Height: 5' 3"  (1.6 m)  Psych/Mental status: Alert, oriented x 3 (person, place, & time)       Eyes: PERLA Respiratory: No evidence of acute respiratory distress  Cervical Spine Area Exam  Skin & Axial Inspection: needle marks and skin irritation from scratching visible  Alignment: Symmetrical Functional ROM: Unrestricted ROM      Stability: No instability detected Muscle Tone/Strength: Functionally intact. No obvious neuro-muscular anomalies detected. Sensory (Neurological): Unimpaired Palpation: Complains of area being tender to palpation              Upper Extremity (UE) Exam    Side: Right upper extremity  Side: Left upper extremity  Skin & Extremity Inspection: Skin color, temperature, and hair growth are WNL. No peripheral edema or cyanosis. No masses, redness, swelling, asymmetry, or associated skin lesions. No contractures.  Skin & Extremity Inspection: Skin color, temperature, and hair growth are WNL. No peripheral edema or cyanosis. No masses, redness, swelling, asymmetry, or associated skin lesions. No contractures.  Functional ROM: Unrestricted ROM          Functional ROM: Unrestricted ROM          Muscle Tone/Strength: Functionally intact. No obvious neuro-muscular anomalies detected.  Muscle Tone/Strength: Functionally intact. No obvious neuro-muscular anomalies detected.  Sensory (Neurological): Unimpaired          Sensory (Neurological): Unimpaired          Palpation: No palpable anomalies              Palpation: No palpable anomalies  Specialized Test(s): Deferred         Specialized Test(s): Deferred          Thoracic Spine Area Exam  Skin & Axial Inspection: No masses, redness, or swelling Alignment: Symmetrical Functional ROM: Unrestricted ROM Stability: No instability detected Muscle Tone/Strength:  Functionally intact. No obvious neuro-muscular anomalies detected. Sensory (Neurological): Unimpaired Muscle strength & Tone: No palpable anomalies  Lumbar Spine Area Exam  Skin & Axial Inspection: No masses, redness, or swelling Alignment: Symmetrical Functional ROM: Unrestricted ROM      Stability: No instability detected Muscle Tone/Strength: Functionally intact. No obvious neuro-muscular anomalies detected. Sensory (Neurological): Unimpaired Palpation: No palpable anomalies       Provocative Tests: Lumbar Hyperextension and rotation test: evaluation deferred today       Lumbar Lateral bending test: evaluation deferred today       Patrick's Maneuver: evaluation deferred today                    Gait & Posture Assessment  Ambulation: Unassisted Gait: Relatively normal for age and body habitus Posture: WNL   Lower Extremity Exam    Side: Right lower extremity  Side: Left lower extremity  Skin & Extremity Inspection: Skin color, temperature, and hair growth are WNL. No peripheral edema or cyanosis. No masses, redness, swelling, asymmetry, or associated skin lesions. No contractures.  Skin & Extremity Inspection: Skin color, temperature, and hair growth are WNL. No peripheral edema or cyanosis. No masses, redness, swelling, asymmetry, or associated skin lesions. No contractures.  Functional ROM: Unrestricted ROM          Functional ROM: Unrestricted ROM          Muscle Tone/Strength: Functionally intact. No obvious neuro-muscular anomalies detected.  Muscle Tone/Strength: Functionally intact. No obvious neuro-muscular anomalies detected.  Sensory (Neurological): Unimpaired  Sensory (Neurological): Unimpaired  Palpation: No palpable anomalies  Palpation: No palpable anomalies   Assessment  Primary Diagnosis & Pertinent Problem List: The primary encounter diagnosis was Chronic neck pain (Primary Source of Pain) (Bilateral) (R>L). Diagnoses of Cervical spondylosis, Cervical facet syndrome  (Bilateral) (R>L), Chronic pain syndrome, Fibromyalgia, Musculoskeletal pain, and Long term current use of opiate analgesic were also pertinent to this visit.  Status Diagnosis  Stable Stable Stable 1. Chronic neck pain (Primary Source of Pain) (Bilateral) (R>L)   2. Cervical spondylosis   3. Cervical facet syndrome (Bilateral) (R>L)   4. Chronic pain syndrome   5. Fibromyalgia   6. Musculoskeletal pain   7. Long term current use of opiate analgesic     Problems updated and reviewed during this visit: No problems updated. Plan of Care  Pharmacotherapy (Medications Ordered): Meds ordered this encounter  Medications  . gabapentin (NEURONTIN) 300 MG capsule    Sig: Take 1 capsule (300 mg total) by mouth 3 (three) times daily.    Dispense:  270 capsule    Refill:  0    Do not add this medication to the electronic "Automatic Refill" notification system. Patient may have prescription filled one day early if pharmacy is closed on scheduled refill date.    Order Specific Question:   Supervising Provider    Answer:   Milinda Pointer [phone]  . cyclobenzaprine (FLEXERIL) 10 MG tablet    Sig: Take 1 tablet (10 mg total) by mouth 3 (three) times daily as needed for muscle spasms.    Dispense:  90 tablet    Refill:  2    Do not add this medication to  the electronic "Automatic Refill" notification system. Patient may have prescription filled one day early if pharmacy is closed on scheduled refill date.    Order Specific Question:   Supervising Provider    Answer:   Milinda Pointer [phone]  . oxyCODONE (OXY IR/ROXICODONE) 5 MG immediate release tablet    Sig: Take 1 tablet (5 mg total) by mouth 2 (two) times daily.    Dispense:  60 tablet    Refill:  0    Do not place this medication, or any other prescription from our practice, on "Automatic Refill". Patient may have prescription filled one day early if pharmacy is closed on scheduled refill date. Do not fill until: 06/09/2017 To last  until:07/09/2017    Order Specific Question:   Supervising Provider    Answer:   Milinda Pointer [phone]  . oxyCODONE (OXY IR/ROXICODONE) 5 MG immediate release tablet    Sig: Take 1 tablet (5 mg total) by mouth 2 (two) times daily.    Dispense:  60 tablet    Refill:  0    Do not place this medication, or any other prescription from our practice, on "Automatic Refill". Patient may have prescription filled one day early if pharmacy is closed on scheduled refill date. Do not fill until: 05/10/2017 To last until: 06/09/2017    Order Specific Question:   Supervising Provider    Answer:   Milinda Pointer [phone]  . oxyCODONE (OXY IR/ROXICODONE) 5 MG immediate release tablet    Sig: Take 1 tablet (5 mg total) by mouth 2 (two) times daily.    Dispense:  60 tablet    Refill:  0    Do not place this medication, or any other prescription from our practice, on "Automatic Refill". Patient may have prescription filled one day early if pharmacy is closed on scheduled refill date. Do not fill until:04/10/2017 To last until:05/10/2017    Order Specific Question:   Supervising Provider    Answer:   Milinda Pointer [phone]   New Prescriptions   No medications on file   Medications administered today: Tanya [name] had no medications administered during this visit. Lab-work, procedure(s), and/or referral(s): Orders Placed This Encounter  Procedures  . ToxASSURE Select 13 (MW), Urine   Imaging and/or referral(s): None  Interventional therapies: Planned, scheduled, and/or pending: Not at this time.    Considering:  Diagnostic bilateral intra-articular shoulder joint injection Diagnostic bilateral suprascapular nerve block Possible bilateral suprascapular nerve RFA Right-sided cervical epidural steroid injection(Series #2) Diagnostic bilateral cervical facet block Possible bilateral cervical facet radiofrequencyablation  Diagnostic bilateral lumbar facet  block Possible bilateral lumbar facet radiofrequency ablation    Palliative PRN treatment(s):  Right-sided cervical epidural steroid injection(Series #2)      Provider-requested follow-up: Return in about 3 months (around 07/09/2017) for MedMgmt with Me Dionisio David).  Future Appointments  Date Time Provider [place]  07/02/2017 10:45 AM Vevelyn Francois, NP [facility] None   Primary Care Physician: Ricardo Jericho, NP Location: [facility] Management Facility Note by: Vevelyn Francois NP Date: 04/08/2017; Time: 12:35 PM  Pain Score Disclaimer: We use the NRS-11 scale. This is a self-reported, subjective measurement of pain severity with only modest accuracy. It is used primarily to identify changes within a particular patient. It must be understood that outpatient pain scales are significantly less accurate that those used for research, where they can be applied under ideal controlled circumstances with minimal exposure to variables. In reality, the score is likely to  be a combination of pain intensity and pain affect, where pain affect describes the degree of emotional arousal or changes in action readiness caused by the sensory experience of pain. Factors such as social and work situation, setting, emotional state, anxiety levels, expectation, and prior pain experience may influence pain perception and show large inter-individual differences that may also be affected by time variables.  Patient instructions provided during this appointment: Patient Instructions  You have been given 3 Rx for Oxycodone IR to last until 07/09/17. Rx for gabapentin and flexeril have been escribed to your pharmacy.____________________________________________________________________________________________  Medication Rules  Applies to: All patients receiving prescriptions (written or electronic).  Pharmacy of record: Pharmacy where electronic prescriptions will be sent. If  written prescriptions are taken to a different pharmacy, please inform the nursing staff. The pharmacy listed in the electronic medical record should be the one where you would like electronic prescriptions to be sent.  Prescription refills: Only during scheduled appointments. Applies to both, written and electronic prescriptions.  NOTE: The following applies primarily to controlled substances (Opioid* Pain Medications).   Patient's responsibilities: 1. Pain Pills: Bring all pain pills to every appointment (except for procedure appointments). 2. Pill Bottles: Bring pills in original pharmacy bottle. Always bring newest bottle. Bring bottle, even if empty. 3. Medication refills: You are responsible for knowing and keeping track of what medications you need refilled. The day before your appointment, write a list of all prescriptions that need to be refilled. Bring that list to your appointment and give it to the admitting nurse. Prescriptions will be written only during appointments. If you forget a medication, it will not be "Called in", "Faxed", or "electronically sent". You will need to get another appointment to get these prescribed. 4. Prescription Accuracy: You are responsible for carefully inspecting your prescriptions before leaving our office. Have the discharge nurse carefully go over each prescription with you, before taking them home. Make sure that your name is accurately spelled, that your address is correct. Check the name and dose of your medication to make sure it is accurate. Check the number of pills, and the written instructions to make sure they are clear and accurate. Make sure that you are given enough medication to last until your next medication refill appointment. 5. Taking Medication: Take medication as prescribed. Never take more pills than instructed. Never take medication more frequently than prescribed. Taking less pills or less frequently is permitted and encouraged, when it  comes to controlled substances (written prescriptions).  6. Inform other Doctors: Always inform, all of your healthcare providers, of all the medications you take. 7. Pain Medication from other Providers: You are not allowed to accept any additional pain medication from any other Doctor or Healthcare provider. There are two exceptions to this rule. (see below) In the event that you require additional pain medication, you are responsible for notifying us, as stated below. 8. Medication Agreement: You are responsible for carefully reading and following our Medication Agreement. This must be signed before receiving any prescriptions from our practice. Safely store a copy of your signed Agreement. Violations to the Agreement will result in no further prescriptions. (Additional copies of our Medication Agreement are available upon request.) 9. Laws, Rules, & Regulations: All patients are expected to follow all Federal and [employer], [employer], Rules, [employer]. Ignorance of the Laws does not constitute a valid excuse. The use of any illegal substances is prohibited. 10. Adopted CDC guidelines & recommendations: Target dosing levels will be at or  below 60 MME/day. Use of benzodiazepines** is not recommended.  Exceptions: There are only two exceptions to the rule of not receiving pain medications from other Healthcare Providers. 1. Exception #1 (Emergencies): In the event of an emergency (i.e.: accident requiring emergency care), you are allowed to receive additional pain medication. However, you are responsible for: As soon as you are able, call our office [phone], at any time of the day or night, and leave a message stating your name, the date and nature of the emergency, and the name and dose of the medication prescribed. In the event that your call is answered by a member of our staff, make sure to document and save the date, time, and the name of the person that took your information.  2. Exception  #2 (Planned Surgery): In the event that you are scheduled by another doctor or dentist to have any type of surgery or procedure, you are allowed (for a period no longer than 30 days), to receive additional pain medication, for the acute post-op pain. However, in this case, you are responsible for picking up a copy of our "Post-op Pain Management for Surgeons" handout, and giving it to your surgeon or dentist. This document is available at our office, and does not require an appointment to obtain it. Simply go to our office during business hours (Monday-Thursday from 8:00 AM to 4:00 PM) (Friday 8:00 AM to 12:00 Noon) or if you have a scheduled appointment with [place], prior to your surgery, and ask for it by name. In addition, you will need to provide [place] with your name, name of your surgeon, type of surgery, and date of procedure or surgery.  *Opioid medications include: morphine, codeine, oxycodone, oxymorphone, hydrocodone, hydromorphone, meperidine, tramadol, tapentadol, buprenorphine, fentanyl, methadone. **Benzodiazepine medications include: diazepam (Valium), alprazolam (Xanax), clonazepam (Klonopine), lorazepam (Ativan), clorazepate (Tranxene), chlordiazepoxide (Librium), estazolam (Prosom), oxazepam (Serax), temazepam (Restoril), triazolam (Halcion) (Last updated: 03/20/2017) ____________________________________________________________________________________________

## 2017-04-13 LAB — TOXASSURE SELECT 13 (MW), URINE

## 2017-05-13 DIAGNOSIS — R21 Rash and other nonspecific skin eruption: Secondary | ICD-10-CM | POA: Insufficient documentation

## 2017-05-22 ENCOUNTER — Other Ambulatory Visit: Payer: Self-pay | Admitting: Gastroenterology

## 2017-05-22 DIAGNOSIS — R1310 Dysphagia, unspecified: Secondary | ICD-10-CM

## 2017-05-27 ENCOUNTER — Ambulatory Visit
Admission: RE | Admit: 2017-05-27 | Discharge: 2017-05-27 | Disposition: A | Discharge status: Home / Self Care | Payer: Medicaid Other | Source: Ambulatory Visit | Attending: Gastroenterology | Admitting: Gastroenterology

## 2017-05-27 DIAGNOSIS — R1310 Dysphagia, unspecified: Secondary | ICD-10-CM | POA: Diagnosis present

## 2017-06-29 ENCOUNTER — Other Ambulatory Visit: Payer: Self-pay

## 2017-06-29 ENCOUNTER — Ambulatory Visit
Admission: EM | Admit: 2017-06-29 | Discharge: 2017-06-29 | Disposition: A | Discharge status: Home / Self Care | Payer: Medicaid Other | Attending: Family Medicine | Admitting: Family Medicine

## 2017-06-29 DIAGNOSIS — J40 Bronchitis, not specified as acute or chronic: Secondary | ICD-10-CM

## 2017-06-29 MED ORDER — ALBUTEROL SULFATE HFA 108 (90 BASE) MCG/ACT IN AERS: 1.0000 | INHALATION_SPRAY | Freq: Four times a day (QID) | RESPIRATORY_TRACT | 0 refills | Status: DC | PRN

## 2017-06-29 MED ORDER — PREDNISONE 50 MG PO TABS: ORAL_TABLET | ORAL | 0 refills | Status: DC

## 2017-06-29 MED ORDER — DOXYCYCLINE HYCLATE 100 MG PO CAPS: 100.0000 mg | ORAL_CAPSULE | Freq: Two times a day (BID) | ORAL | 0 refills | Status: DC

## 2017-06-29 NOTE — Discharge Instructions (Signed)
Medications as prescribed. ° °Take care ° °Dr. Nitin Mckowen  °

## 2017-06-29 NOTE — ED Provider Notes (Signed)
[facility]    CSN: [id number] Arrival date & time: 06/29/17  1313  History   Chief Complaint Chief Complaint  Patient presents with  . Cough   HPI  57 year old female presents with cough, congestion.  2-week history of severe cough.  Cough is dry.  She reports sinus pain, pressure, and congestion.  Purulent nasal discharge.  Cough is her predominant symptom.  Associated wheezing and SOB.  Subjective fever last night.  Symptoms are severe.  Worsening.  She has had no improvement.  No known exacerbating factors.  No other associated symptoms.  No other complaints.  Past Medical History:  Diagnosis Date  . Anxiety   . Bladder infection    8/18  . Depression   . Hyperlipidemia   . Lupus ([place])   . Overactive bladder     Patient Active Problem List   Diagnosis Date Noted  . Acute postoperative pain 06/26/2016  . Cervical spondylosis 04/15/2016  . Cervical facet syndrome (Bilateral) (R>L) 04/15/2016  . Shoulder radicular pain (Bilateral) (R>L) 04/15/2016  . Musculoskeletal pain 03/07/2016  . Vitamin D insufficiency 12/19/2015  . 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 Anxiety 11/23/2015  . Chronic low back pain [facility] source of pain) (Bilateral) (R>L) 11/23/2015  . Chronic neck pain (Primary Source of Pain) (Bilateral) (R>L) 11/23/2015  . Long term prescription benzodiazepine use 11/23/2015  . Long term current use of opiate analgesic 11/23/2015  . Long term prescription opiate use 11/23/2015  . Opiate use (10 MME/Day) 11/23/2015  . Chronic pain syndrome 11/23/2015  . Chronic upper extremity pain (Right) 11/23/2015  . Chronic cervical radicular pain (Right) 11/23/2015  . Chronic shoulder pain (Bilateral) (R>L) 11/23/2015  . Hypercholesterolemia 06/20/2015  . 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 Encounter for long-term (current) use of high-risk medication 06/06/2015  . Pain medication agreement signed 11/24/2014  .  Hyperlipidemia 01/25/2014  . 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 Diverticulitis 10/04/2012  . Osteoarthritis 10/04/2012  . Chronic upper back pain (Secondary source of pain) (Bilateral) (R>L) 08/17/2012  . Urinary incontinence 08/17/2012  . Fibromyalgia 06/04/2012  . SLE (systemic lupus erythematosus) ([place]) 02/18/2012  . Generalized anxiety disorder 01/07/2012  . Major depressive disorder, recurrent ([place]) 09/29/2009    Past Surgical History:  Procedure Laterality Date  . ABDOMINAL HYSTERECTOMY     OB History   None    Home Medications    Prior to Admission medications   Medication Sig Start Date End Date Taking? Authorizing Provider  DULoxetine (CYMBALTA) 30 MG capsule Take 30 mg by mouth daily.   Yes [provider]  hydroxychloroquine (PLAQUENIL) 200 MG tablet Take by mouth. 09/23/14  Yes [provider]  albuterol (PROVENTIL HFA;VENTOLIN HFA) 108 (90 Base) MCG/ACT inhaler Inhale 1-2 puffs into the lungs every 6 (six) hours as needed for wheezing or shortness of breath. 06/29/17   Coral Spikes, DO  cyclobenzaprine (FLEXERIL) 10 MG tablet Take 1 tablet (10 mg total) by mouth 3 (three) times daily as needed for muscle spasms. 04/08/17 07/07/17  Vevelyn Francois, NP  doxycycline (VIBRAMYCIN) 100 MG capsule Take 1 capsule (100 mg total) by mouth 2 (two) times daily. 06/29/17   Coral Spikes, DO  DULoxetine (CYMBALTA) 30 MG capsule TAKE 1 CAPSULE(30 MG) BY MOUTH EVERY DAY 10/18/15 04/08/17  [provider]  gabapentin (NEURONTIN) 300 MG capsule Take 1 capsule (300 mg total) by mouth 3 (three) times daily. 04/08/17 07/07/17  Vevelyn Francois, NP  oxyCODONE (OXY IR/ROXICODONE)  5 MG immediate release tablet Take 1 tablet (5 mg total) by mouth 2 (two) times daily. 06/09/17 07/09/17  Vevelyn Francois, NP  oxyCODONE (OXY IR/ROXICODONE) 5 MG immediate release tablet Take 1 tablet (5 mg total) by mouth 2 (two) times daily. 05/10/17 06/09/17  Vevelyn Francois, NP  oxyCODONE (OXY IR/ROXICODONE) 5  MG immediate release tablet Take 1 tablet (5 mg total) by mouth 2 (two) times daily. 04/10/17 05/10/17  Vevelyn Francois, NP  predniSONE (DELTASONE) 50 MG tablet 1 tablet daily x 5 days. 06/29/17   Coral Spikes, DO    Family History Family History  Problem Relation Age of Onset  . Cancer Mother   . Hypertension Mother   . Emphysema Mother   . Stroke Mother   . Glaucoma Father   . Heart disease Father   . Diabetes Brother     Social History Social History   Tobacco Use  . Smoking status: Current Every Day Smoker    Packs/day: 0.50    Types: Cigarettes  . Smokeless tobacco: Never Used  Substance Use Topics  . Alcohol use: No  . Drug use: No     Allergies   Trazodone; Bupropion; Methocarbamol; Oxycodone-acetaminophen; Tramadol; and Trazodone and nefazodone   Review of Systems Review of Systems  Constitutional: Positive for fever.  HENT: Positive for congestion.   Respiratory: Positive for cough and wheezing.    Physical Exam Triage Vital Signs ED Triage Vitals  Enc Vitals Group     BP 06/29/17 1321 115/79     Pulse Rate 06/29/17 1321 87     Resp 06/29/17 1321 18     Temp 06/29/17 1321 98.2 F (36.8 C)     Temp Source 06/29/17 1321 Oral     SpO2 06/29/17 1321 98 %     Weight 06/29/17 1322 148 lb (67.1 kg)     Height 06/29/17 1322 5\' 3"  (1.6 m)     Head Circumference --      Peak Flow --      Pain Score 06/29/17 1322 7     Pain Loc --      Pain Edu? --      Excl. in [place]? --    Updated Vital Signs BP 115/79 (BP Location: Left Arm)   Pulse 87   Temp 98.2 F (36.8 C) (Oral)   Resp 18   Ht 5\' 3"  (1.6 m)   Wt 148 lb (67.1 kg)   SpO2 98%   BMI 26.22 kg/m     Physical Exam  Constitutional: She is oriented to person, place, and time. She appears well-developed.  Appears ill.  HENT:  Head: Normocephalic and atraumatic.  Mouth/Throat: Oropharynx is clear and moist.  Eyes: Conjunctivae are normal. Right eye exhibits no discharge. Left eye exhibits no  discharge.  Cardiovascular: Normal rate and regular rhythm.  Pulmonary/Chest:  Mild increased work of breathing.  Diffuse expiratory wheezing.  Neurological: She is alert and oriented to person, place, and time.  Psychiatric: She has a normal mood and affect. Her behavior is normal.  Nursing note and vitals reviewed.  UC Treatments / Results  Labs (all labs ordered are listed, but only abnormal results are displayed) Labs Reviewed - No data to display  EKG None  Radiology No results found.  Procedures Procedures (including critical care time)  Medications Ordered in UC Medications - No data to display  Initial Impression / Assessment and Plan / UC Course  I have reviewed the triage  vital signs and the nursing notes.  Pertinent labs & imaging results that were available during my care of the patient were reviewed by me and considered in my medical decision making (see chart for details).    57 year old female presents with bronchitis.  Likely has underlying COPD as she continues to abuse tobacco.  Treating with prednisone, Doxy.  Albuterol as needed.  Final Clinical Impressions(s) / UC Diagnoses   Final diagnoses:  Bronchitis     Discharge Instructions     Medications as prescribed.  Take care  Dr. Lacinda Axon    ED Prescriptions    Medication Sig Dispense Auth. Provider   predniSONE (DELTASONE) 50 MG tablet 1 tablet daily x 5 days. 5 tablet Amoree Newlon G, DO   doxycycline (VIBRAMYCIN) 100 MG capsule Take 1 capsule (100 mg total) by mouth 2 (two) times daily. 14 capsule Maurica Omura G, DO   albuterol (PROVENTIL HFA;VENTOLIN HFA) 108 (90 Base) MCG/ACT inhaler Inhale 1-2 puffs into the lungs every 6 (six) hours as needed for wheezing or shortness of breath. [place], DO     Controlled Substance Prescriptions [place] Controlled Substance Registry consulted? Not Applicable   Coral Spikes, DO 06/29/17 1357

## 2017-06-29 NOTE — ED Triage Notes (Signed)
Pt with sinus drainage, non-productive cough, blowing green-yellow mucous from nose. Pain across eyes. Pain 7/10

## 2017-07-02 ENCOUNTER — Encounter: Payer: Self-pay | Admitting: Nurse Practitioner

## 2017-07-02 ENCOUNTER — Ambulatory Visit: Payer: Medicaid Other | Attending: Nurse Practitioner | Admitting: Nurse Practitioner

## 2017-07-02 ENCOUNTER — Other Ambulatory Visit: Payer: Self-pay

## 2017-07-02 VITALS — BP 128/87 | HR 95 | Temp 98.1°F | Resp 16 | Ht 63.0 in | Wt 148.0 lb

## 2017-07-02 DIAGNOSIS — E785 Hyperlipidemia, unspecified: Secondary | ICD-10-CM | POA: Insufficient documentation

## 2017-07-02 DIAGNOSIS — M797 Fibromyalgia: Secondary | ICD-10-CM | POA: Diagnosis not present

## 2017-07-02 DIAGNOSIS — F329 Major depressive disorder, single episode, unspecified: Secondary | ICD-10-CM | POA: Diagnosis not present

## 2017-07-02 DIAGNOSIS — K5792 Diverticulitis of intestine, part unspecified, without perforation or abscess without bleeding: Secondary | ICD-10-CM | POA: Diagnosis not present

## 2017-07-02 DIAGNOSIS — Z8249 Family history of ischemic heart disease and other diseases of the circulatory system: Secondary | ICD-10-CM | POA: Diagnosis not present

## 2017-07-02 DIAGNOSIS — Z79891 Long term (current) use of opiate analgesic: Secondary | ICD-10-CM | POA: Insufficient documentation

## 2017-07-02 DIAGNOSIS — Z888 Allergy status to other drugs, medicaments and biological substances status: Secondary | ICD-10-CM | POA: Insufficient documentation

## 2017-07-02 DIAGNOSIS — G894 Chronic pain syndrome: Secondary | ICD-10-CM | POA: Diagnosis not present

## 2017-07-02 DIAGNOSIS — M542 Cervicalgia: Secondary | ICD-10-CM | POA: Diagnosis not present

## 2017-07-02 DIAGNOSIS — M47892 Other spondylosis, cervical region: Secondary | ICD-10-CM | POA: Insufficient documentation

## 2017-07-02 DIAGNOSIS — Z823 Family history of stroke: Secondary | ICD-10-CM | POA: Insufficient documentation

## 2017-07-02 DIAGNOSIS — L930 Discoid lupus erythematosus: Secondary | ICD-10-CM | POA: Diagnosis not present

## 2017-07-02 DIAGNOSIS — M7918 Myalgia, other site: Secondary | ICD-10-CM | POA: Diagnosis not present

## 2017-07-02 DIAGNOSIS — M19012 Primary osteoarthritis, left shoulder: Secondary | ICD-10-CM | POA: Insufficient documentation

## 2017-07-02 DIAGNOSIS — Z7952 Long term (current) use of systemic steroids: Secondary | ICD-10-CM | POA: Insufficient documentation

## 2017-07-02 DIAGNOSIS — F17210 Nicotine dependence, cigarettes, uncomplicated: Secondary | ICD-10-CM | POA: Diagnosis not present

## 2017-07-02 DIAGNOSIS — M545 Low back pain: Secondary | ICD-10-CM | POA: Diagnosis not present

## 2017-07-02 DIAGNOSIS — N3281 Overactive bladder: Secondary | ICD-10-CM | POA: Insufficient documentation

## 2017-07-02 DIAGNOSIS — G8929 Other chronic pain: Secondary | ICD-10-CM

## 2017-07-02 DIAGNOSIS — M19011 Primary osteoarthritis, right shoulder: Secondary | ICD-10-CM | POA: Diagnosis not present

## 2017-07-02 DIAGNOSIS — F411 Generalized anxiety disorder: Secondary | ICD-10-CM | POA: Insufficient documentation

## 2017-07-02 DIAGNOSIS — M47812 Spondylosis without myelopathy or radiculopathy, cervical region: Secondary | ICD-10-CM

## 2017-07-02 DIAGNOSIS — Z825 Family history of asthma and other chronic lower respiratory diseases: Secondary | ICD-10-CM | POA: Diagnosis not present

## 2017-07-02 DIAGNOSIS — E7800 Pure hypercholesterolemia, unspecified: Secondary | ICD-10-CM | POA: Insufficient documentation

## 2017-07-02 DIAGNOSIS — Z90710 Acquired absence of both cervix and uterus: Secondary | ICD-10-CM | POA: Diagnosis not present

## 2017-07-02 DIAGNOSIS — Z885 Allergy status to narcotic agent status: Secondary | ICD-10-CM | POA: Diagnosis not present

## 2017-07-02 DIAGNOSIS — Z809 Family history of malignant neoplasm, unspecified: Secondary | ICD-10-CM | POA: Insufficient documentation

## 2017-07-02 DIAGNOSIS — Z833 Family history of diabetes mellitus: Secondary | ICD-10-CM | POA: Diagnosis not present

## 2017-07-02 DIAGNOSIS — Z79899 Other long term (current) drug therapy: Secondary | ICD-10-CM | POA: Diagnosis not present

## 2017-07-02 MED ORDER — GABAPENTIN 300 MG PO CAPS: 300.0000 mg | ORAL_CAPSULE | Freq: Three times a day (TID) | ORAL | 0 refills | Status: DC

## 2017-07-02 MED ORDER — CYCLOBENZAPRINE HCL 10 MG PO TABS: 10.0000 mg | ORAL_TABLET | Freq: Three times a day (TID) | ORAL | 0 refills | Status: DC | PRN

## 2017-07-02 MED ORDER — OXYCODONE HCL 5 MG PO TABS: 5.0000 mg | ORAL_TABLET | Freq: Two times a day (BID) | ORAL | 0 refills | Status: DC

## 2017-07-02 NOTE — Progress Notes (Signed)
Nursing Pain Medication Assessment:  Safety precautions to be maintained throughout the outpatient stay will include: orient to surroundings, keep bed in low position, maintain call bell within reach at all times, provide assistance with transfer out of bed and ambulation.  Medication Inspection Compliance: Pill count conducted under aseptic conditions, in front of the patient. Neither the pills nor the bottle was removed from the patient's sight at any time. Once count was completed pills were immediately returned to the patient in their original bottle.  Medication: Oxycodone IR Pill/Patch Count: 1 of 60 pills remain Pill/Patch Appearance: Markings consistent with prescribed medication Bottle Appearance: Standard pharmacy container. Clearly labeled. Filled Date: 5 / 20 / 2019 Last Medication intake:  06/28/2017

## 2017-07-02 NOTE — Patient Instructions (Addendum)
Flexeril and gabapentin has been escribed to your pharmacy. You have been given Rx for oxycodone to last until 08/08/2017.  GENERAL RISKS AND COMPLICATIONS  What are the risk, side effects and possible complications? Generally speaking, most procedures are safe.  However, with any procedure there are risks, side effects, and the possibility of complications.  The risks and complications are dependent upon the sites that are lesioned, or the type of nerve block to be performed.  The closer the procedure is to the spine, the more serious the risks are.  Great care is taken when placing the radio frequency needles, block needles or lesioning probes, but sometimes complications can occur. 1. Infection: Any time there is an injection through the skin, there is a risk of infection.  This is why sterile conditions are used for these blocks.  There are four possible types of infection. 1. Localized skin infection. 2. Central Nervous System Infection-This can be in the form of Meningitis, which can be deadly. 3. Epidural Infections-This can be in the form of an epidural abscess, which can cause pressure inside of the spine, causing compression of the spinal cord with subsequent paralysis. This would require an emergency surgery to decompress, and there are no guarantees that the patient would recover from the paralysis. 4. Discitis-This is an infection of the intervertebral discs.  It occurs in about 1% of discography procedures.  It is difficult to treat and it may lead to surgery.        2. Pain: the needles have to go through skin and soft tissues, will cause soreness.       3. Damage to internal structures:  The nerves to be lesioned may be near blood vessels or    other nerves which can be potentially damaged.       4. Bleeding: Bleeding is more common if the patient is taking blood thinners such as  aspirin, Coumadin, Ticiid, Plavix, etc., or if he/she have some genetic predisposition  such as  hemophilia. Bleeding into the spinal canal can cause compression of the spinal  cord with subsequent paralysis.  This would require an emergency surgery to  decompress and there are no guarantees that the patient would recover from the  paralysis.       5. Pneumothorax:  Puncturing of a lung is a possibility, every time a needle is introduced in  the area of the chest or upper back.  Pneumothorax refers to free air around the  collapsed lung(s), inside of the thoracic cavity (chest cavity).  Another two possible  complications related to a similar event would include: Hemothorax and Chylothorax.   These are variations of the Pneumothorax, where instead of air around the collapsed  lung(s), you may have blood or chyle, respectively.       6. Spinal headaches: They may occur with any procedures in the area of the spine.       7. Persistent CSF (Cerebro-Spinal Fluid) leakage: This is a rare problem, but may occur  with prolonged intrathecal or epidural catheters either due to the formation of a fistulous  track or a dural tear.       8. Nerve damage: By working so close to the spinal cord, there is always a possibility of  nerve damage, which could be as serious as a permanent spinal cord injury with  paralysis.       9. Death:  Although rare, severe deadly allergic reactions known as "Anaphylactic  reaction" can occur to any  of the medications used.      10. Worsening of the symptoms:  We can always make thing worse.  What are the chances of something like this happening? Chances of any of this occuring are extremely low.  By statistics, you have more of a chance of getting killed in a motor vehicle accident: while driving to the hospital than any of the above occurring .  Nevertheless, you should be aware that they are possibilities.  In general, it is similar to taking a shower.  Everybody knows that you can slip, hit your head and get killed.  Does that mean that you should not shower again?  Nevertheless  always keep in mind that statistics do not mean anything if you happen to be on the wrong side of them.  Even if a procedure has a 1 (one) in a 1,000,000 (million) chance of going wrong, it you happen to be that one..Also, keep in mind that by statistics, you have more of a chance of having something go wrong when taking medications.  Who should not have this procedure? If you are on a blood thinning medication (e.g. Coumadin, Plavix, see list of "Blood Thinners"), or if you have an active infection going on, you should not have the procedure.  If you are taking any blood thinners, please inform your physician.  How should I prepare for this procedure?  Do not eat or drink anything at least six hours prior to the procedure.  Bring a driver with you .  It cannot be a taxi.  Come accompanied by an adult that can drive you back, and that is strong enough to help you if your legs get weak or numb from the local anesthetic.  Take all of your medicines the morning of the procedure with just enough water to swallow them.  If you have diabetes, make sure that you are scheduled to have your procedure done first thing in the morning, whenever possible.  If you have diabetes, take only half of your insulin dose and notify our nurse that you have done so as soon as you arrive at the clinic.  If you are diabetic, but only take blood sugar pills (oral hypoglycemic), then do not take them on the morning of your procedure.  You may take them after you have had the procedure.  Do not take aspirin or any aspirin-containing medications, at least eleven (11) days prior to the procedure.  They may prolong bleeding.  Wear loose fitting clothing that may be easy to take off and that you would not mind if it got stained with Betadine or blood.  Do not wear any jewelry or perfume  Remove any nail coloring.  It will interfere with some of our monitoring equipment.  NOTE: Remember that this is not meant to be  interpreted as a complete list of all possible complications.  Unforeseen problems may occur.  BLOOD THINNERS The following drugs contain aspirin or other products, which can cause increased bleeding during surgery and should not be taken for 2 weeks prior to and 1 week after surgery.  If you should need take something for relief of minor pain, you may take acetaminophen which is found in Tylenol,m Datril, Anacin-3 and Panadol. It is not blood thinner. The products listed below are.  Do not take any of the products listed below in addition to any listed on your instruction sheet.  A.P.C or A.P.C with Codeine Codeine Phosphate Capsules #3 Ibuprofen Ridaura  ABC compound  Congesprin Imuran rimadil  Advil Cope Indocin Robaxisal  Alka-Seltzer Effervescent Pain Reliever and Antacid Coricidin or Coricidin-D  Indomethacin Rufen  Alka-Seltzer plus Cold Medicine Cosprin Ketoprofen S-A-C Tablets  Anacin Analgesic Tablets or Capsules Coumadin Korlgesic Salflex  Anacin Extra Strength Analgesic tablets or capsules CP-2 Tablets Lanoril Salicylate  Anaprox Cuprimine Capsules Levenox Salocol  Anexsia-D Dalteparin Magan Salsalate  Anodynos Darvon compound Magnesium Salicylate Sine-off  Ansaid Dasin Capsules Magsal Sodium Salicylate  Anturane Depen Capsules Marnal Soma  APF Arthritis pain formula Dewitt's Pills Measurin Stanback  Argesic Dia-Gesic Meclofenamic Sulfinpyrazone  Arthritis Bayer Timed Release Aspirin Diclofenac Meclomen Sulindac  Arthritis pain formula Anacin Dicumarol Medipren Supac  Analgesic (Safety coated) Arthralgen Diffunasal Mefanamic Suprofen  Arthritis Strength Bufferin Dihydrocodeine Mepro Compound Suprol  Arthropan liquid Dopirydamole Methcarbomol with Aspirin Synalgos  ASA tablets/Enseals Disalcid Micrainin Tagament  Ascriptin Doan's Midol Talwin  Ascriptin A/D Dolene Mobidin Tanderil  Ascriptin Extra Strength Dolobid Moblgesic Ticlid  Ascriptin with Codeine Doloprin or Doloprin  with Codeine Momentum Tolectin  Asperbuf Duoprin Mono-gesic Trendar  Aspergum Duradyne Motrin or Motrin IB Triminicin  Aspirin plain, buffered or enteric coated Durasal Myochrisine Trigesic  Aspirin Suppositories Easprin Nalfon Trillsate  Aspirin with Codeine Ecotrin Regular or Extra Strength Naprosyn Uracel  Atromid-S Efficin Naproxen Ursinus  Auranofin Capsules Elmiron Neocylate Vanquish  Axotal Emagrin Norgesic Verin  Azathioprine Empirin or Empirin with Codeine Normiflo Vitamin E  Azolid Emprazil Nuprin Voltaren  Bayer Aspirin plain, buffered or children's or timed BC Tablets or powders Encaprin Orgaran Warfarin Sodium  Buff-a-Comp Enoxaparin Orudis Zorpin  Buff-a-Comp with Codeine Equegesic Os-Cal-Gesic   Buffaprin Excedrin plain, buffered or Extra Strength Oxalid   Bufferin Arthritis Strength Feldene Oxphenbutazone   Bufferin plain or Extra Strength Feldene Capsules Oxycodone with Aspirin   Bufferin with Codeine Fenoprofen Fenoprofen Pabalate or Pabalate-SF   Buffets II Flogesic Panagesic   Buffinol plain or Extra Strength Florinal or Florinal with Codeine Panwarfarin   Buf-Tabs Flurbiprofen Penicillamine   Butalbital Compound Four-way cold tablets Penicillin   Butazolidin Fragmin Pepto-Bismol   Carbenicillin Geminisyn Percodan   Carna Arthritis Reliever Geopen Persantine   Carprofen Gold's salt Persistin   Chloramphenicol Goody's Phenylbutazone   Chloromycetin Haltrain Piroxlcam   Clmetidine heparin Plaquenil   Cllnoril Hyco-pap Ponstel   Clofibrate Hydroxy chloroquine Propoxyphen         Before stopping any of these medications, be sure to consult the physician who ordered them.  Some, such as Coumadin (Warfarin) are ordered to prevent or treat serious conditions such as "deep thrombosis", "pumonary embolisms", and other heart problems.  The amount of time that you may need off of the medication may also vary with the medication and the reason for which you were taking it.   If you are taking any of these medications, please make sure you notify your pain physician before you undergo any procedures.         Facet Blocks Patient Information  Description: The facets are joints in the spine between the vertebrae.  Like any joints in the body, facets can become irritated and painful.  Arthritis can also effect the facets.  By injecting steroids and local anesthetic in and around these joints, we can temporarily block the nerve supply to them.  Steroids act directly on irritated nerves and tissues to reduce selling and inflammation which often leads to decreased pain.  Facet blocks may be done anywhere along the spine from the neck to the low back depending upon the location of your pain.  After numbing the skin with local anesthetic (like Novocaine), a small needle is passed onto the facet joints under x-ray guidance.  You may experience a sensation of pressure while this is being done.  The entire block usually lasts about 15-25 minutes.   Conditions which may be treated by facet blocks:   Low back/buttock pain  Neck/shoulder pain  Certain types of headaches  Preparation for the injection:  1. Do not eat any solid food or dairy products within 8 hours of your appointment. 2. You may drink clear liquid up to 3 hours before appointment.  Clear liquids include water, black coffee, juice or soda.  No milk or cream please. 3. You may take your regular medication, including pain medications, with a sip of water before your appointment.  Diabetics should hold regular insulin (if taken separately) and take 1/2 normal NPH dose the morning of the procedure.  Carry some sugar containing items with you to your appointment. 4. A driver must accompany you and be prepared to drive you home after your procedure. 5. Bring all your current medications with you. 6. An IV may be inserted and sedation may be given at the discretion of the physician. 7. A blood pressure cuff,  EKG and other monitors will often be applied during the procedure.  Some patients may need to have extra oxygen administered for a short period. 8. You will be asked to provide medical information, including your allergies and medications, prior to the procedure.  We must know immediately if you are taking blood thinners (like Coumadin/Warfarin) or if you are allergic to IV iodine contrast (dye).  We must know if you could possible be pregnant.  Possible side-effects:   Bleeding from needle site  Infection (rare, may require surgery)  Nerve injury (rare)  Numbness & tingling (temporary)  Difficulty urinating (rare, temporary)  Spinal headache (a headache worse with upright posture)  Light-headedness (temporary)  Pain at injection site (serveral days)  Decreased blood pressure (rare, temporary)  Weakness in arm/leg (temporary)  Pressure sensation in back/neck (temporary)   Call if you experience:   Fever/chills associated with headache or increased back/neck pain  Headache worsened by an upright position  New onset, weakness or numbness of an extremity below the injection site  Hives or difficulty breathing (go to the emergency room)  Inflammation or drainage at the injection site(s)  Severe back/neck pain greater than usual  New symptoms which are concerning to you  Please note:  Although the local anesthetic injected can often make your back or neck feel good for several hours after the injection, the pain will likely return. It takes 3-7 days for steroids to work.  You may not notice any pain relief for at least one week.  If effective, we will often do a series of 2-3 injections spaced 3-6 weeks apart to maximally decrease your pain.  After the initial series, you may be a candidate for a more permanent nerve block of the facets.  If you have any questions, please call #[phone]) [facility]____________________________________________________________________________________________  Medication Rules  Applies to: All patients receiving prescriptions (written or electronic).  Pharmacy of record: Pharmacy where electronic prescriptions will be sent. If written prescriptions are taken to a different pharmacy, please inform the nursing staff. The pharmacy listed in the electronic medical record should be the one where you would like electronic prescriptions to be sent.  Prescription refills: Only during scheduled appointments. Applies to both, written and  electronic prescriptions.  NOTE: The following applies primarily to controlled substances (Opioid* Pain Medications).   Patient's responsibilities: 80. Pain Pills: Bring all pain pills to every appointment (except for procedure appointments). 24. Pill Bottles: Bring pills in original pharmacy bottle. Always bring newest bottle. Bring bottle, even if empty. 25. Medication refills: You are responsible for knowing and keeping track of what medications you need refilled. The day before your appointment, write a list of all prescriptions that need to be refilled. Bring that list to your appointment and give it to the admitting nurse. Prescriptions will be written only during appointments. If you forget a medication, it will not be "Called in", "Faxed", or "electronically sent". You will need to get another appointment to get these prescribed. 26. Prescription Accuracy: You are responsible for carefully inspecting your prescriptions before leaving our office. Have the discharge nurse carefully go over each prescription with you, before taking them home. Make sure that your name is accurately spelled, that your address is correct. Check the name and dose of your medication to make sure it is accurate. Check the number of pills, and the written instructions to make sure they are clear and accurate. Make sure that you are given enough  medication to last until your next medication refill appointment. 27. Taking Medication: Take medication as prescribed. Never take more pills than instructed. Never take medication more frequently than prescribed. Taking less pills or less frequently is permitted and encouraged, when it comes to controlled substances (written prescriptions).  28. Inform other Doctors: Always inform, all of your healthcare providers, of all the medications you take. 29. Pain Medication from other Providers: You are not allowed to accept any additional pain medication from any other Doctor or Healthcare provider. There are two exceptions to this rule. (see below) In the event that you require additional pain medication, you are responsible for notifying us, as stated below. 30. Medication Agreement: You are responsible for carefully reading and following our Medication Agreement. This must be signed before receiving any prescriptions from our practice. Safely store a copy of your signed Agreement. Violations to the Agreement will result in no further prescriptions. (Additional copies of our Medication Agreement are available upon request.) 31. Laws, Rules, & Regulations: All patients are expected to follow all Federal and [employer], [employer], Rules, [employer]. Ignorance of the Laws does not constitute a valid excuse. The use of any illegal substances is prohibited. 71. Adopted CDC guidelines & recommendations: Target dosing levels will be at or below 60 MME/day. Use of benzodiazepines** is not recommended.  Exceptions: There are only two exceptions to the rule of not receiving pain medications from other Healthcare Providers. 1. Exception #1 (Emergencies): In the event of an emergency (i.e.: accident requiring emergency care), you are allowed to receive additional pain medication. However, you are responsible for: As soon as you are able, call our office [phone], at any time of the day or night, and leave a  message stating your name, the date and nature of the emergency, and the name and dose of the medication prescribed. In the event that your call is answered by a member of our staff, make sure to document and save the date, time, and the name of the person that took your information.  2. Exception #2 (Planned Surgery): In the event that you are scheduled by another doctor or dentist to have any type of surgery or procedure, you are allowed (for a period no longer than 30 days), to  receive additional pain medication, for the acute post-op pain. However, in this case, you are responsible for picking up a copy of our "Post-op Pain Management for Surgeons" handout, and giving it to your surgeon or dentist. This document is available at our office, and does not require an appointment to obtain it. Simply go to our office during business hours (Monday-Thursday from 8:00 AM to 4:00 PM) (Friday 8:00 AM to 12:00 Noon) or if you have a scheduled appointment with [place], prior to your surgery, and ask for it by name. In addition, you will need to provide [place] with your name, name of your surgeon, type of surgery, and date of procedure or surgery.  *Opioid medications include: morphine, codeine, oxycodone, oxymorphone, hydrocodone, hydromorphone, meperidine, tramadol, tapentadol, buprenorphine, fentanyl, methadone. **Benzodiazepine medications include: diazepam (Valium), alprazolam (Xanax), clonazepam (Klonopine), lorazepam (Ativan), clorazepate (Tranxene), chlordiazepoxide (Librium), estazolam (Prosom), oxazepam (Serax), temazepam (Restoril), triazolam (Halcion) (Last updated: 03/20/2017) ____________________________________________________________________________________________

## 2017-07-02 NOTE — Progress Notes (Signed)
Patient's Name: Tanya [name]  MRN: [id number]  Referring Provider: Ricardo Jericho*  DOB: 30-May-1960  PCP: Ricardo Jericho, NP  DOS: 07/02/2017  Note by: Vevelyn Francois NP  Service setting: Ambulatory outpatient  Specialty: Interventional Pain Management  Location: [facility] (AMB) Pain Management Facility    Patient type: Established    Primary Reason(s) for Visit: Encounter for prescription drug management. (Level of risk: moderate)  CC: Neck Pain and Back Pain (lower)  HPI  Tanya [name] is a 57 y.o. year old, female patient, who comes today for a medication management evaluation. She has Anxiety; Chronic upper back pain (Secondary source of pain) (Bilateral) (R>L); Chronic discoid lupus erythematosus; Chronic low back pain [facility] source of pain) (Bilateral) (R>L); Chronic neck pain (Primary Source of Pain) (Bilateral) (R>L); Discoid lupus; Diverticulitis; Encounter for long-term (current) use of high-risk medication; Fibromyalgia; Generalized anxiety disorder; Hypercholesterolemia; Hyperlipidemia; Major depressive disorder, recurrent ([place]); Pain medication agreement signed; Osteoarthritis; SLE (systemic lupus erythematosus) ([place]); Urinary incontinence; Long term prescription benzodiazepine use; Long term current use of opiate analgesic; Long term prescription opiate use; Opiate use (10 MME/Day); Chronic pain syndrome; Chronic upper extremity pain (Right); Chronic cervical radicular pain (Right); Chronic shoulder pain (Bilateral) (R>L); 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 Her primarily concern today is the Neck Pain and Back Pain (lower)  Pain Assessment: Location: Posterior Neck Radiating: down spine to periscapula area and lower back Onset: More than a month  ago Duration: Chronic pain Quality: Constant, Burning Severity: 5 /10 (subjective, self-reported pain score)  Note: Reported level is compatible with observation.                          Timing: Constant Modifying factors: medications, injections BP: 128/87  HR: 95  Tanya [name] was last scheduled for an appointment on 04/08/2017 for medication management. During today's appointment we reviewed Tanya [name]'s chronic pain status, as well as her outpatient medication regimen. She admits that she has bronchitis and this has been going for about 2 weeks. She admits that her neck pain is worse. She admits that the pain is worse in her neck and radiates off to the shoulders and down into her back.   The patient  reports that she does not use drugs. Her body mass index is 26.22 kg/m.  Further details on both, my assessment(s), as well as the proposed treatment plan, please see below.  Controlled Substance Pharmacotherapy Assessment REMS (Risk Evaluation and Mitigation Strategy)  Analgesic:Oxycodone IR 5 mg 1 tablet by mouth twice a day (10 mg/dayof oxycodone). MME/day:70m/day.   WRise [name] RN  07/02/2017 11:07 AM  Sign at close encounter Nursing Pain Medication Assessment:  Safety precautions to be maintained throughout the outpatient stay will include: orient to surroundings, keep bed in low position, maintain call bell within reach at all times, provide assistance with transfer out of bed and ambulation.  Medication Inspection Compliance: Pill count conducted under aseptic conditions, in front of the patient. Neither the pills nor the bottle was removed from the patient's sight at any time. Once count was completed pills were immediately returned to the patient in their original bottle.  Medication: Oxycodone IR Pill/Patch Count: 1 of 60 pills remain Pill/Patch Appearance: Markings consistent with prescribed medication Bottle Appearance: Standard pharmacy container. Clearly  labeled. Filled Date: 5 /  20 / 2019 Last Medication intake:  06/28/2017   Pharmacokinetics: Liberation and absorption (onset of action): WNL Distribution (time to peak effect): WNL Metabolism and excretion (duration of action): WNL         Pharmacodynamics: Desired effects: Analgesia: Tanya [name] reports >50% benefit. Functional ability: Patient reports that medication allows her to accomplish basic ADLs Clinically meaningful improvement in function (CMIF): Sustained CMIF goals met Perceived effectiveness: Described as relatively effective, allowing for increase in activities of daily living (ADL) Undesirable effects: Side-effects or Adverse reactions: None reported Monitoring: [place] PMP: Online review of the past 67-monthperiod conducted. Compliant with practice rules and regulations Last UDS on record: Summary  Date Value Ref Range Status  04/08/2017 FINAL  Final    Comment:    ==================================================================== TOXASSURE SELECT 13 (MW) ==================================================================== Test                             Result       Flag       Units Drug Absent but Declared for Prescription Verification   Oxycodone                      Not Detected UNEXPECTED ng/mg creat   Tramadol                       Not Detected UNEXPECTED ng/mg creat ==================================================================== Test                      Result    Flag   Units      Ref Range   Creatinine              32               mg/dL      >=20 ==================================================================== Declared Medications:  The flagging and interpretation on this report are based on the  following declared medications.  Unexpected results may arise from  inaccuracies in the declared medications.  **Note: The testing scope of this panel includes these medications:  Oxycodone  Oxycodone (Oxycodone Acetaminophen)  Tramadol  **Note:  The testing scope of this panel does not include following  reported medications:  Acetaminophen (Oxycodone Acetaminophen)  Bupropion  Cyclobenzaprine  Duloxetine (Cymbalta)  Gabapentin  Methocarbamol  Trazodone ==================================================================== For clinical consultation, please call [phone] ====================================================================    UDS interpretation: Non-Compliant          Medication Assessment Form: Reviewed. Patient indicates being compliant with therapy Treatment compliance: Deficiencies noted and steps taken to remind the patient of the seriousness of adequate therapy compliance Risk Assessment Profile: Aberrant behavior: See prior evaluations. None observed or detected today Comorbid factors increasing risk of overdose: See prior notes. No additional risks detected today Risk of substance use disorder (SUD): High Opioid Risk Tool - 07/02/17 1115      Family History of Substance Abuse   Alcohol  Negative    Illegal Drugs  Negative    Rx Drugs  Negative      Personal History of Substance Abuse   Alcohol  Negative    Illegal Drugs  Negative    Rx Drugs  Negative      Age   Age between 113-45years   No      Psychological Disease   Psychological Disease  Negative    Depression  Positive      Total Score  Opioid Risk Tool Scoring  1    Opioid Risk Interpretation  Low Risk      ORT Scoring interpretation table:  Score <3 = Low Risk for SUD  Score between 4-7 = Moderate Risk for SUD  Score >8 = High Risk for Opioid Abuse   Risk Mitigation Strategies:  Patient Counseling: Covered Patient-Prescriber Agreement (PPA): Present and active  Notification to other healthcare providers: Done  Pharmacologic Plan: No change in therapy, at this time.             Laboratory Chemistry  Inflammation Markers (CRP: Acute Phase) (ESR: Chronic Phase) Lab Results  Component Value Date   CRP 6.0 (H)  07/11/2016   ESRSEDRATE 7 07/11/2016                         Rheumatology Markers No results found for: RF, ANA, LABURIC, URICUR, LYMEIGGIGMAB, LYMEABIGMQN, HLAB27                      Renal Function Markers Lab Results  Component Value Date   BUN 14 09/08/2016   CREATININE 0.85 09/08/2016   BCR 16 07/11/2016   GFRAA >60 09/08/2016   GFRNONAA >60 09/08/2016                             Hepatic Function Markers Lab Results  Component Value Date   AST 21 09/08/2016   ALT 16 09/08/2016   ALBUMIN 4.4 09/08/2016   ALKPHOS 36 (L) 09/08/2016   LIPASE 37 09/08/2016                        Electrolytes Lab Results  Component Value Date   NA 137 09/08/2016   K 3.4 (L) 09/08/2016   CL 102 09/08/2016   CALCIUM 9.2 09/08/2016   MG 2.2 07/11/2016                        Neuropathy Markers Lab Results  Component Value Date   VITAMINB12 310 07/11/2016                        Bone Pathology Markers Lab Results  Component Value Date   25OHVITD1 9.6 (L) 07/11/2016   25OHVITD2 <1.0 07/11/2016   25OHVITD3 9.4 07/11/2016                         Coagulation Parameters Lab Results  Component Value Date   PLT 375 09/08/2016                        Cardiovascular Markers Lab Results  Component Value Date   HGB 13.4 09/08/2016   HCT 40.0 09/08/2016                         CA Markers No results found for: CEA, CA125, LABCA2                      Note: Lab results reviewed.  Recent Diagnostic Imaging Results  DG Esophagus CLINICAL DATA:  Chronic dysphagia with worsening symptoms x last few weeks, states solids and liquids "just won't go down", no difficulty with medications, known issues with reflux.  EXAM: ESOPHOGRAM / BARIUM SWALLOW / BARIUM TABLET STUDY  TECHNIQUE: Combined double contrast and single contrast examination performed using effervescent crystals, thick barium liquid, and thin barium liquid. The patient was observed with fluoroscopy swallowing a 13  mm barium sulphate tablet.  FLUOROSCOPY TIME:  Fluoroscopy Time:  0.8 minute  Radiation Exposure Index (if provided by the fluoroscopic device): 4 mGy  Number of Acquired Spot Images: 0  COMPARISON:  None.  FINDINGS: There was normal pharyngeal anatomy and motility. Contrast flowed freely through the esophagus without evidence of stricture or mass. There was normal esophageal mucosa without evidence of irregularity or ulceration. Esophageal motility was normal. No evidence of reflux. No definite hiatal hernia was demonstrated.  At the end of the examination a 13 mm barium tablet was administered which transited through the esophagus and esophagogastric junction without delay.  IMPRESSION: 1. Normal barium swallow.  Electronically Signed   By: Kathreen Devoid   On: 05/27/2017 09:37  Complexity Note: Imaging results reviewed. Results shared with Ms. Espe, using Layman's terms.                         Meds   Current Outpatient Medications:  .  albuterol (PROVENTIL HFA;VENTOLIN HFA) 108 (90 Base) MCG/ACT inhaler, Inhale 1-2 puffs into the lungs every 6 (six) hours as needed for wheezing or shortness of breath., Disp: 1 Inhaler, Rfl: 0 .  cyclobenzaprine (FLEXERIL) 10 MG tablet, Take 1 tablet (10 mg total) by mouth 3 (three) times daily as needed for muscle spasms., Disp: 90 tablet, Rfl: 0 .  doxycycline (VIBRAMYCIN) 100 MG capsule, Take 1 capsule (100 mg total) by mouth 2 (two) times daily., Disp: 14 capsule, Rfl: 0 .  DULoxetine (CYMBALTA) 30 MG capsule, TAKE 1 CAPSULE(30 MG) BY MOUTH EVERY DAY, Disp: , Rfl:  .  DULoxetine (CYMBALTA) 30 MG capsule, Take 30 mg by mouth daily., Disp: , Rfl:  .  gabapentin (NEURONTIN) 300 MG capsule, Take 1 capsule (300 mg total) by mouth 3 (three) times daily., Disp: 90 capsule, Rfl: 0 .  hydroxychloroquine (PLAQUENIL) 200 MG tablet, Take by mouth., Disp: , Rfl:  .  [START ON 07/09/2017] oxyCODONE (OXY IR/ROXICODONE) 5 MG immediate release  tablet, Take 1 tablet (5 mg total) by mouth 2 (two) times daily., Disp: 60 tablet, Rfl: 0 .  predniSONE (DELTASONE) 50 MG tablet, 1 tablet daily x 5 days., Disp: 5 tablet, Rfl: 0 .  oxyCODONE (OXY IR/ROXICODONE) 5 MG immediate release tablet, Take 1 tablet (5 mg total) by mouth 2 (two) times daily., Disp: 60 tablet, Rfl: 0 .  oxyCODONE (OXY IR/ROXICODONE) 5 MG immediate release tablet, Take 1 tablet (5 mg total) by mouth 2 (two) times daily., Disp: 60 tablet, Rfl: 0  ROS  Constitutional: Denies any fever or chills Gastrointestinal: No reported hemesis, hematochezia, vomiting, or acute GI distress Musculoskeletal: Denies any acute onset joint swelling, redness, loss of ROM, or weakness Neurological: No reported episodes of acute onset apraxia, aphasia, dysarthria, agnosia, amnesia, paralysis, loss of coordination, or loss of consciousness  Allergies  Ms. Swisher is allergic to trazodone; bupropion; methocarbamol; oxycodone-acetaminophen; tramadol; and trazodone and nefazodone.  [place]  Drug: Ms. Jenniges  reports that she does not use drugs. Alcohol:  reports that she does not drink alcohol. Tobacco:  reports that she has been smoking cigarettes.  She has been smoking about 0.50 packs per day. She has never used smokeless tobacco. Medical:  has a past medical history of Anxiety, Bladder infection, Depression, Hyperlipidemia, Lupus ([place]), and Overactive bladder. Surgical:  Ms. Kawa  has a past surgical history that includes Abdominal hysterectomy. Family: family history includes Cancer in her mother; Diabetes in her brother; Emphysema in her mother; Glaucoma in her father; Heart disease in her father; Hypertension in her mother; Stroke in her mother.  Constitutional Exam  General appearance: Well nourished, well developed, and well hydrated. In no apparent acute distress Vitals:   07/02/17 1103  BP: 128/87  Pulse: 95  Resp: 16  Temp: 98.1 F (36.7 C)  TempSrc: Oral  SpO2: 99%   Weight: 148 lb (67.1 kg)  Height: 5' 3"  (1.6 m)  Psych/Mental status: Alert, oriented x 3 (person, place, & time)       Eyes: PERLA Respiratory: No evidence of acute respiratory distress  Cervical Spine Area Exam  Skin & Axial Inspection: No masses, redness, edema, swelling, or associated skin lesions Alignment: Symmetrical Functional ROM: Unrestricted ROM      Stability: No instability detected Muscle Tone/Strength: Functionally intact. No obvious neuro-muscular anomalies detected. Sensory (Neurological): Unimpaired Palpation: No palpable anomalies              Upper Extremity (UE) Exam    Side: Right upper extremity  Side: Left upper extremity  Skin & Extremity Inspection: Skin color, temperature, and hair growth are WNL. No peripheral edema or cyanosis. No masses, redness, swelling, asymmetry, or associated skin lesions. No contractures.  Skin & Extremity Inspection: Skin color, temperature, and hair growth are WNL. No peripheral edema or cyanosis. No masses, redness, swelling, asymmetry, or associated skin lesions. No contractures.  Functional ROM: Unrestricted ROM          Functional ROM: Unrestricted ROM          Muscle Tone/Strength: Functionally intact. No obvious neuro-muscular anomalies detected.  Muscle Tone/Strength: Functionally intact. No obvious neuro-muscular anomalies detected.  Sensory (Neurological): Unimpaired          Sensory (Neurological): Unimpaired          Palpation: No palpable anomalies              Palpation: No palpable anomalies              Provocative Test(s):  Phalen's test: deferred Tinel's test: deferred Apley's scratch test (touch opposite shoulder):  Action 1 (Across chest): deferred Action 2 (Overhead): deferred Action 3 (LB reach): deferred   Provocative Test(s):  Phalen's test: deferred Tinel's test: deferred Apley's scratch test (touch opposite shoulder):  Action 1 (Across chest): deferred Action 2 (Overhead): deferred Action 3 (LB  reach): deferred    Thoracic Spine Area Exam  Skin & Axial Inspection: No masses, redness, or swelling Alignment: Symmetrical Functional ROM: Unrestricted ROM Stability: No instability detected Muscle Tone/Strength: Functionally intact. No obvious neuro-muscular anomalies detected. Sensory (Neurological): Unimpaired Muscle strength & Tone: No palpable anomalies  Gait & Posture Assessment  Ambulation: Unassisted Gait: Relatively normal for age and body habitus Posture: WNL   Assessment  Primary Diagnosis & Pertinent Problem List: The primary encounter diagnosis was Cervical spondylosis. Diagnoses of Cervical facet syndrome (Bilateral) (R>L), Chronic pain syndrome, Fibromyalgia, Chronic neck pain, and Musculoskeletal pain were also pertinent to this visit.  Status Diagnosis  Controlled Controlled Controlled 1. Cervical spondylosis   2. Cervical facet syndrome (Bilateral) (R>L)   3. Chronic pain syndrome   4. Fibromyalgia   5. Chronic neck pain   6. Musculoskeletal pain     Problems updated and reviewed during this visit: Problem  Rash   New face, arms   Anxiety  Overview:  cymbalta and busapr  Overview:  Cymbalta;cannnot tolerate higher dose of Cymbalta. Cannot tolerate hydroxyzine. Failed buspar.   Overview:  Cymbalta;. Cannot tolerate hydroxyzine. Failed buspar.   Overview:  Cymbalta;. Cannot tolerate hydroxyzine. Failed buspar.     Plan of Care  Pharmacotherapy (Medications Ordered): Meds ordered this encounter  Medications  . oxyCODONE (OXY IR/ROXICODONE) 5 MG immediate release tablet    Sig: Take 1 tablet (5 mg total) by mouth 2 (two) times daily.    Dispense:  60 tablet    Refill:  0    Do not place this medication, or any other prescription from our practice, on "Automatic Refill". Patient may have prescription filled one day early if pharmacy is closed on scheduled refill date. Do not fill until:07/09/2017 To last until:08/08/2017    Order Specific  Question:   Supervising Provider    Answer:   Milinda Pointer [phone]  . gabapentin (NEURONTIN) 300 MG capsule    Sig: Take 1 capsule (300 mg total) by mouth 3 (three) times daily.    Dispense:  90 capsule    Refill:  0    Do not add this medication to the electronic "Automatic Refill" notification system. Patient may have prescription filled one day early if pharmacy is closed on scheduled refill date.    Order Specific Question:   Supervising Provider    Answer:   Milinda Pointer [phone]  . cyclobenzaprine (FLEXERIL) 10 MG tablet    Sig: Take 1 tablet (10 mg total) by mouth 3 (three) times daily as needed for muscle spasms.    Dispense:  90 tablet    Refill:  0    Do not add this medication to the electronic "Automatic Refill" notification system. Patient may have prescription filled one day early if pharmacy is closed on scheduled refill date.    Order Specific Question:   Supervising Provider    Answer:   Milinda Pointer [[id number]]   New Prescriptions   No medications on file   Medications administered today: Bernie L. Binion had no medications administered during this visit. Lab-work, procedure(s), and/or referral(s): Orders Placed This Encounter  Procedures  . CERVICAL FACET (MEDIAL BRANCH NERVE BLOCK)    Imaging and/or referral(s): None  Interventional therapies: Planned, scheduled, and/or pending: Diagnostic bilateral cervical facet block   Considering:  Diagnostic bilateral intra-articular shoulder joint injection Diagnostic bilateral suprascapular nerve block Possible bilateral suprascapular nerve RFA Right-sided cervical epidural steroid injection(Series #2) Diagnostic bilateral cervical facet block Possible bilateral cervical facet radiofrequencyablation  Diagnostic bilateral lumbar facet block Possible bilateral lumbar facet radiofrequency ablation    Palliative PRN treatment(s):  Right-sided cervical epidural steroid  injection(Series #2)   Provider-requested follow-up: Return in about 1 month (around 07/30/2017) for MedMgmt with Me Dionisio David), in addition, w/ Dr. Dossie Arbour, Procedure(w/Sedation).  Future Appointments  Date Time Provider [place]  07/30/2017  9:45 AM Vevelyn Francois, NP [facility] None  08/05/2017  9:45 AM Milinda Pointer, MD [facility] None   Primary Care Physician: Ricardo Jericho, NP Location: [facility] Note by: Vevelyn Francois NP Date: 07/02/2017; Time: 12:28 PM  Pain Score Disclaimer: We use the NRS-11 scale. This is a self-reported, subjective measurement of pain severity with only modest accuracy. It is used primarily to identify changes within a particular patient. It must be understood that outpatient pain scales are significantly less accurate that those used for research, where they can be applied under ideal controlled circumstances with minimal exposure to variables.  In reality, the score is likely to be a combination of pain intensity and pain affect, where pain affect describes the degree of emotional arousal or changes in action readiness caused by the sensory experience of pain. Factors such as social and work situation, setting, emotional state, anxiety levels, expectation, and prior pain experience may influence pain perception and show large inter-individual differences that may also be affected by time variables.  Patient instructions provided during this appointment: Patient Instructions   Flexeril and gabapentin has been escribed to your pharmacy. You have been given Rx for oxycodone to last until 08/08/2017.  GENERAL RISKS AND COMPLICATIONS  What are the risk, side effects and possible complications? Generally speaking, most procedures are safe.  However, with any procedure there are risks, side effects, and the possibility of complications.  The risks and complications are dependent upon the sites that are  lesioned, or the type of nerve block to be performed.  The closer the procedure is to the spine, the more serious the risks are.  Great care is taken when placing the radio frequency needles, block needles or lesioning probes, but sometimes complications can occur. 1. Infection: Any time there is an injection through the skin, there is a risk of infection.  This is why sterile conditions are used for these blocks.  There are four possible types of infection. 1. Localized skin infection. 2. Central Nervous System Infection-This can be in the form of Meningitis, which can be deadly. 3. Epidural Infections-This can be in the form of an epidural abscess, which can cause pressure inside of the spine, causing compression of the spinal cord with subsequent paralysis. This would require an emergency surgery to decompress, and there are no guarantees that the patient would recover from the paralysis. 4. Discitis-This is an infection of the intervertebral discs.  It occurs in about 1% of discography procedures.  It is difficult to treat and it may lead to surgery.        2. Pain: the needles have to go through skin and soft tissues, will cause soreness.       3. Damage to internal structures:  The nerves to be lesioned may be near blood vessels or    other nerves which can be potentially damaged.       4. Bleeding: Bleeding is more common if the patient is taking blood thinners such as  aspirin, Coumadin, Ticiid, Plavix, etc., or if he/she have some genetic predisposition  such as hemophilia. Bleeding into the spinal canal can cause compression of the spinal  cord with subsequent paralysis.  This would require an emergency surgery to  decompress and there are no guarantees that the patient would recover from the  paralysis.       5. Pneumothorax:  Puncturing of a lung is a possibility, every time a needle is introduced in  the area of the chest or upper back.  Pneumothorax refers to free air around the  collapsed  lung(s), inside of the thoracic cavity (chest cavity).  Another two possible  complications related to a similar event would include: Hemothorax and Chylothorax.   These are variations of the Pneumothorax, where instead of air around the collapsed  lung(s), you may have blood or chyle, respectively.       6. Spinal headaches: They may occur with any procedures in the area of the spine.       7. Persistent CSF (Cerebro-Spinal Fluid) leakage: This is a rare problem, but may occur  with  prolonged intrathecal or epidural catheters either due to the formation of a fistulous  track or a dural tear.       8. Nerve damage: By working so close to the spinal cord, there is always a possibility of  nerve damage, which could be as serious as a permanent spinal cord injury with  paralysis.       9. Death:  Although rare, severe deadly allergic reactions known as "Anaphylactic  reaction" can occur to any of the medications used.      10. Worsening of the symptoms:  We can always make thing worse.  What are the chances of something like this happening? Chances of any of this occuring are extremely low.  By statistics, you have more of a chance of getting killed in a motor vehicle accident: while driving to the hospital than any of the above occurring .  Nevertheless, you should be aware that they are possibilities.  In general, it is similar to taking a shower.  Everybody knows that you can slip, hit your head and get killed.  Does that mean that you should not shower again?  Nevertheless always keep in mind that statistics do not mean anything if you happen to be on the wrong side of them.  Even if a procedure has a 1 (one) in a 1,000,000 (million) chance of going wrong, it you happen to be that one..Also, keep in mind that by statistics, you have more of a chance of having something go wrong when taking medications.  Who should not have this procedure? If you are on a blood thinning medication (e.g. Coumadin, Plavix,  see list of "Blood Thinners"), or if you have an active infection going on, you should not have the procedure.  If you are taking any blood thinners, please inform your physician.  How should I prepare for this procedure?  Do not eat or drink anything at least six hours prior to the procedure.  Bring a driver with you .  It cannot be a taxi.  Come accompanied by an adult that can drive you back, and that is strong enough to help you if your legs get weak or numb from the local anesthetic.  Take all of your medicines the morning of the procedure with just enough water to swallow them.  If you have diabetes, make sure that you are scheduled to have your procedure done first thing in the morning, whenever possible.  If you have diabetes, take only half of your insulin dose and notify our nurse that you have done so as soon as you arrive at the clinic.  If you are diabetic, but only take blood sugar pills (oral hypoglycemic), then do not take them on the morning of your procedure.  You may take them after you have had the procedure.  Do not take aspirin or any aspirin-containing medications, at least eleven (11) days prior to the procedure.  They may prolong bleeding.  Wear loose fitting clothing that may be easy to take off and that you would not mind if it got stained with Betadine or blood.  Do not wear any jewelry or perfume  Remove any nail coloring.  It will interfere with some of our monitoring equipment.  NOTE: Remember that this is not meant to be interpreted as a complete list of all possible complications.  Unforeseen problems may occur.  BLOOD THINNERS The following drugs contain aspirin or other products, which can cause increased bleeding during surgery and should  not be taken for 2 weeks prior to and 1 week after surgery.  If you should need take something for relief of minor pain, you may take acetaminophen which is found in Tylenol,m Datril, Anacin-3 and Panadol. It is not  blood thinner. The products listed below are.  Do not take any of the products listed below in addition to any listed on your instruction sheet.  A.P.C or A.P.C with Codeine Codeine Phosphate Capsules #3 Ibuprofen Ridaura  ABC compound Congesprin Imuran rimadil  Advil Cope Indocin Robaxisal  Alka-Seltzer Effervescent Pain Reliever and Antacid Coricidin or Coricidin-D  Indomethacin Rufen  Alka-Seltzer plus Cold Medicine Cosprin Ketoprofen S-A-C Tablets  Anacin Analgesic Tablets or Capsules Coumadin Korlgesic Salflex  Anacin Extra Strength Analgesic tablets or capsules CP-2 Tablets Lanoril Salicylate  Anaprox Cuprimine Capsules Levenox Salocol  Anexsia-D Dalteparin Magan Salsalate  Anodynos Darvon compound Magnesium Salicylate Sine-off  Ansaid Dasin Capsules Magsal Sodium Salicylate  Anturane Depen Capsules Marnal Soma  APF Arthritis pain formula Dewitt's Pills Measurin Stanback  Argesic Dia-Gesic Meclofenamic Sulfinpyrazone  Arthritis Bayer Timed Release Aspirin Diclofenac Meclomen Sulindac  Arthritis pain formula Anacin Dicumarol Medipren Supac  Analgesic (Safety coated) Arthralgen Diffunasal Mefanamic Suprofen  Arthritis Strength Bufferin Dihydrocodeine Mepro Compound Suprol  Arthropan liquid Dopirydamole Methcarbomol with Aspirin Synalgos  ASA tablets/Enseals Disalcid Micrainin Tagament  Ascriptin Doan's Midol Talwin  Ascriptin A/D Dolene Mobidin Tanderil  Ascriptin Extra Strength Dolobid Moblgesic Ticlid  Ascriptin with Codeine Doloprin or Doloprin with Codeine Momentum Tolectin  Asperbuf Duoprin Mono-gesic Trendar  Aspergum Duradyne Motrin or Motrin IB Triminicin  Aspirin plain, buffered or enteric coated Durasal Myochrisine Trigesic  Aspirin Suppositories Easprin Nalfon Trillsate  Aspirin with Codeine Ecotrin Regular or Extra Strength Naprosyn Uracel  Atromid-S Efficin Naproxen Ursinus  Auranofin Capsules Elmiron Neocylate Vanquish  Axotal Emagrin Norgesic Verin  Azathioprine  Empirin or Empirin with Codeine Normiflo Vitamin E  Azolid Emprazil Nuprin Voltaren  Bayer Aspirin plain, buffered or children's or timed BC Tablets or powders Encaprin Orgaran Warfarin Sodium  Buff-a-Comp Enoxaparin Orudis Zorpin  Buff-a-Comp with Codeine Equegesic Os-Cal-Gesic   Buffaprin Excedrin plain, buffered or Extra Strength Oxalid   Bufferin Arthritis Strength Feldene Oxphenbutazone   Bufferin plain or Extra Strength Feldene Capsules Oxycodone with Aspirin   Bufferin with Codeine Fenoprofen Fenoprofen Pabalate or Pabalate-SF   Buffets II Flogesic Panagesic   Buffinol plain or Extra Strength Florinal or Florinal with Codeine Panwarfarin   Buf-Tabs Flurbiprofen Penicillamine   Butalbital Compound Four-way cold tablets Penicillin   Butazolidin Fragmin Pepto-Bismol   Carbenicillin Geminisyn Percodan   Carna Arthritis Reliever Geopen Persantine   Carprofen Gold's salt Persistin   Chloramphenicol Goody's Phenylbutazone   Chloromycetin Haltrain Piroxlcam   Clmetidine heparin Plaquenil   Cllnoril Hyco-pap Ponstel   Clofibrate Hydroxy chloroquine Propoxyphen         Before stopping any of these medications, be sure to consult the physician who ordered them.  Some, such as Coumadin (Warfarin) are ordered to prevent or treat serious conditions such as "deep thrombosis", "pumonary embolisms", and other heart problems.  The amount of time that you may need off of the medication may also vary with the medication and the reason for which you were taking it.  If you are taking any of these medications, please make sure you notify your pain physician before you undergo any procedures.         Facet Blocks Patient Information  Description: The facets are joints in the spine between the vertebrae.  Like any joints  in the body, facets can become irritated and painful.  Arthritis can also effect the facets.  By injecting steroids and local anesthetic in and around these joints, we can  temporarily block the nerve supply to them.  Steroids act directly on irritated nerves and tissues to reduce selling and inflammation which often leads to decreased pain.  Facet blocks may be done anywhere along the spine from the neck to the low back depending upon the location of your pain.   After numbing the skin with local anesthetic (like Novocaine), a small needle is passed onto the facet joints under x-ray guidance.  You may experience a sensation of pressure while this is being done.  The entire block usually lasts about 15-25 minutes.   Conditions which may be treated by facet blocks:   Low back/buttock pain  Neck/shoulder pain  Certain types of headaches  Preparation for the injection:  1. Do not eat any solid food or dairy products within 8 hours of your appointment. 2. You may drink clear liquid up to 3 hours before appointment.  Clear liquids include water, black coffee, juice or soda.  No milk or cream please. 3. You may take your regular medication, including pain medications, with a sip of water before your appointment.  Diabetics should hold regular insulin (if taken separately) and take 1/2 normal NPH dose the morning of the procedure.  Carry some sugar containing items with you to your appointment. 4. A driver must accompany you and be prepared to drive you home after your procedure. 5. Bring all your current medications with you. 6. An IV may be inserted and sedation may be given at the discretion of the physician. 7. A blood pressure cuff, EKG and other monitors will often be applied during the procedure.  Some patients may need to have extra oxygen administered for a short period. 8. You will be asked to provide medical information, including your allergies and medications, prior to the procedure.  We must know immediately if you are taking blood thinners (like Coumadin/Warfarin) or if you are allergic to IV iodine contrast (dye).  We must know if you could possible be  pregnant.  Possible side-effects:   Bleeding from needle site  Infection (rare, may require surgery)  Nerve injury (rare)  Numbness & tingling (temporary)  Difficulty urinating (rare, temporary)  Spinal headache (a headache worse with upright posture)  Light-headedness (temporary)  Pain at injection site (serveral days)  Decreased blood pressure (rare, temporary)  Weakness in arm/leg (temporary)  Pressure sensation in back/neck (temporary)   Call if you experience:   Fever/chills associated with headache or increased back/neck pain  Headache worsened by an upright position  New onset, weakness or numbness of an extremity below the injection site  Hives or difficulty breathing (go to the emergency room)  Inflammation or drainage at the injection site(s)  Severe back/neck pain greater than usual  New symptoms which are concerning to you  Please note:  Although the local anesthetic injected can often make your back or neck feel good for several hours after the injection, the pain will likely return. It takes 3-7 days for steroids to work.  You may not notice any pain relief for at least one week.  If effective, we will often do a series of 2-3 injections spaced 3-6 weeks apart to maximally decrease your pain.  After the initial series, you may be a candidate for a more permanent nerve block of the facets.  If you have  any questions, please call #[phone]) [facility]____________________________________________________________________________________________  Medication Rules  Applies to: All patients receiving prescriptions (written or electronic).  Pharmacy of record: Pharmacy where electronic prescriptions will be sent. If written prescriptions are taken to a different pharmacy, please inform the nursing staff. The pharmacy listed in the electronic medical record should be the one where you would like electronic  prescriptions to be sent.  Prescription refills: Only during scheduled appointments. Applies to both, written and electronic prescriptions.  NOTE: The following applies primarily to controlled substances (Opioid* Pain Medications).   Patient's responsibilities: 78. Pain Pills: Bring all pain pills to every appointment (except for procedure appointments). 24. Pill Bottles: Bring pills in original pharmacy bottle. Always bring newest bottle. Bring bottle, even if empty. 25. Medication refills: You are responsible for knowing and keeping track of what medications you need refilled. The day before your appointment, write a list of all prescriptions that need to be refilled. Bring that list to your appointment and give it to the admitting nurse. Prescriptions will be written only during appointments. If you forget a medication, it will not be "Called in", "Faxed", or "electronically sent". You will need to get another appointment to get these prescribed. 26. Prescription Accuracy: You are responsible for carefully inspecting your prescriptions before leaving our office. Have the discharge nurse carefully go over each prescription with you, before taking them home. Make sure that your name is accurately spelled, that your address is correct. Check the name and dose of your medication to make sure it is accurate. Check the number of pills, and the written instructions to make sure they are clear and accurate. Make sure that you are given enough medication to last until your next medication refill appointment. 27. Taking Medication: Take medication as prescribed. Never take more pills than instructed. Never take medication more frequently than prescribed. Taking less pills or less frequently is permitted and encouraged, when it comes to controlled substances (written prescriptions).  28. Inform other Doctors: Always inform, all of your healthcare providers, of all the medications you take. 29. Pain Medication  from other Providers: You are not allowed to accept any additional pain medication from any other Doctor or Healthcare provider. There are two exceptions to this rule. (see below) In the event that you require additional pain medication, you are responsible for notifying us, as stated below. 30. Medication Agreement: You are responsible for carefully reading and following our Medication Agreement. This must be signed before receiving any prescriptions from our practice. Safely store a copy of your signed Agreement. Violations to the Agreement will result in no further prescriptions. (Additional copies of our Medication Agreement are available upon request.) 31. Laws, Rules, & Regulations: All patients are expected to follow all Federal and [employer], Rules, [employer]. Ignorance of the Laws does not constitute a valid excuse. The use of any illegal substances is prohibited. 60. Adopted CDC guidelines & recommendations: Target dosing levels will be at or below 60 MME/day. Use of benzodiazepines** is not recommended.  Exceptions: There are only two exceptions to the rule of not receiving pain medications from other Healthcare Providers. 1. Exception #1 (Emergencies): In the event of an emergency (i.e.: accident requiring emergency care), you are allowed to receive additional pain medication. However, you are responsible for: As soon as you are able, call our office [phone], at any time of the day or night, and leave a message stating your name, the date and nature  of the emergency, and the name and dose of the medication prescribed. In the event that your call is answered by a member of our staff, make sure to document and save the date, time, and the name of the person that took your information.  2. Exception #2 (Planned Surgery): In the event that you are scheduled by another doctor or dentist to have any type of surgery or procedure, you are allowed (for a period no longer than 30  days), to receive additional pain medication, for the acute post-op pain. However, in this case, you are responsible for picking up a copy of our "Post-op Pain Management for Surgeons" handout, and giving it to your surgeon or dentist. This document is available at our office, and does not require an appointment to obtain it. Simply go to our office during business hours (Monday-Thursday from 8:00 AM to 4:00 PM) (Friday 8:00 AM to 12:00 Noon) or if you have a scheduled appointment with [place], prior to your surgery, and ask for it by name. In addition, you will need to provide [place] with your name, name of your surgeon, type of surgery, and date of procedure or surgery.  *Opioid medications include: morphine, codeine, oxycodone, oxymorphone, hydrocodone, hydromorphone, meperidine, tramadol, tapentadol, buprenorphine, fentanyl, methadone. **Benzodiazepine medications include: diazepam (Valium), alprazolam (Xanax), clonazepam (Klonopine), lorazepam (Ativan), clorazepate (Tranxene), chlordiazepoxide (Librium), estazolam (Prosom), oxazepam (Serax), temazepam (Restoril), triazolam (Halcion) (Last updated: 03/20/2017) ____________________________________________________________________________________________

## 2017-07-03 ENCOUNTER — Ambulatory Visit
Admission: RE | Admit: 2017-07-03 | Discharge: 2017-07-03 | Disposition: A | Discharge status: Home / Self Care | Payer: Medicaid Other | Source: Ambulatory Visit | Attending: Gastroenterology | Admitting: Gastroenterology

## 2017-07-03 ENCOUNTER — Encounter
Admission: RE | Disposition: A | Discharge status: Home / Self Care | Payer: Self-pay | Source: Ambulatory Visit | Attending: Gastroenterology

## 2017-07-03 ENCOUNTER — Encounter: Payer: Self-pay | Admitting: Certified Registered Nurse Anesthetist

## 2017-07-03 ENCOUNTER — Ambulatory Visit: Payer: Medicaid Other | Admitting: Certified Registered Nurse Anesthetist

## 2017-07-03 DIAGNOSIS — R1013 Epigastric pain: Secondary | ICD-10-CM | POA: Insufficient documentation

## 2017-07-03 DIAGNOSIS — E785 Hyperlipidemia, unspecified: Secondary | ICD-10-CM | POA: Diagnosis not present

## 2017-07-03 DIAGNOSIS — M545 Low back pain: Secondary | ICD-10-CM | POA: Diagnosis not present

## 2017-07-03 DIAGNOSIS — G8929 Other chronic pain: Secondary | ICD-10-CM | POA: Diagnosis not present

## 2017-07-03 DIAGNOSIS — R1030 Lower abdominal pain, unspecified: Secondary | ICD-10-CM | POA: Diagnosis present

## 2017-07-03 DIAGNOSIS — Z79899 Other long term (current) drug therapy: Secondary | ICD-10-CM | POA: Diagnosis not present

## 2017-07-03 DIAGNOSIS — Z5309 Procedure and treatment not carried out because of other contraindication: Secondary | ICD-10-CM | POA: Diagnosis not present

## 2017-07-03 DIAGNOSIS — F17200 Nicotine dependence, unspecified, uncomplicated: Secondary | ICD-10-CM | POA: Insufficient documentation

## 2017-07-03 DIAGNOSIS — R05 Cough: Secondary | ICD-10-CM | POA: Insufficient documentation

## 2017-07-03 DIAGNOSIS — K625 Hemorrhage of anus and rectum: Secondary | ICD-10-CM | POA: Diagnosis present

## 2017-07-03 DIAGNOSIS — M329 Systemic lupus erythematosus, unspecified: Secondary | ICD-10-CM | POA: Diagnosis not present

## 2017-07-03 DIAGNOSIS — F419 Anxiety disorder, unspecified: Secondary | ICD-10-CM | POA: Diagnosis not present

## 2017-07-03 DIAGNOSIS — F329 Major depressive disorder, single episode, unspecified: Secondary | ICD-10-CM | POA: Insufficient documentation

## 2017-07-03 HISTORY — DX: Low back pain: M54.5

## 2017-07-03 HISTORY — PX: COLONOSCOPY WITH PROPOFOL: SHX5780

## 2017-07-03 HISTORY — DX: Low back pain, unspecified: M54.50

## 2017-07-03 HISTORY — DX: Other chronic pain: G89.29

## 2017-07-03 SURGERY — COLONOSCOPY WITH PROPOFOL
Anesthesia: General

## 2017-07-03 MED ORDER — PROPOFOL 500 MG/50ML IV EMUL
INTRAVENOUS | Status: AC
  Filled 2017-07-03: qty 50

## 2017-07-03 MED ORDER — IPRATROPIUM-ALBUTEROL 0.5-2.5 (3) MG/3ML IN SOLN
3.0000 mL | Freq: Four times a day (QID) | RESPIRATORY_TRACT | Status: AC | PRN
  Administered 2017-07-03: 3 mL via RESPIRATORY_TRACT

## 2017-07-03 MED ORDER — SODIUM CHLORIDE 0.9 % IV SOLN
INTRAVENOUS | Status: DC
  Administered 2017-07-03: 1000 mL via INTRAVENOUS

## 2017-07-03 MED ORDER — LIDOCAINE HCL (CARDIAC) PF 100 MG/5ML IV SOSY
PREFILLED_SYRINGE | INTRAVENOUS | Status: DC | PRN
  Administered 2017-07-03: 50 mg via INTRAVENOUS

## 2017-07-03 MED ORDER — PROPOFOL 10 MG/ML IV BOLUS
INTRAVENOUS | Status: DC | PRN
  Administered 2017-07-03: 100 mg via INTRAVENOUS
  Administered 2017-07-03 (×2): 23 mg via INTRAVENOUS

## 2017-07-03 MED ORDER — PROPOFOL 500 MG/50ML IV EMUL
INTRAVENOUS | Status: DC | PRN
  Administered 2017-07-03: 130 ug/kg/min via INTRAVENOUS

## 2017-07-03 MED ORDER — LIDOCAINE HCL (PF) 2 % IJ SOLN
INTRAMUSCULAR | Status: AC
  Filled 2017-07-03: qty 10

## 2017-07-03 MED ORDER — IPRATROPIUM-ALBUTEROL 0.5-2.5 (3) MG/3ML IN SOLN
RESPIRATORY_TRACT | Status: AC
  Administered 2017-07-03: 3 mL via RESPIRATORY_TRACT
  Filled 2017-07-03: qty 3

## 2017-07-03 NOTE — H&P (Signed)
Outpatient short stay form Pre-procedure 07/03/2017 11:27 AM [name] MD  Primary Physician: Eulogio Bear NP  Reason for visit: EGD and colonoscopy  History of present illness: Patient is a 57 year old female presenting today for further evaluation of complaint of dyspepsia lower quadrant pain and rectal bleeding.  She also had some complaint of dysphagia however she did have a barium swallow done that was completely normal including passage of a sizing 13 mm tablet.  However on presentation today he relates going to urgent care last week for a cough and upper respiratory tract infection.  She has been on antibiotics as well as inhaler as well as a steroid course.  She takes no aspirin or NSAID.  Patient has been noticing some rectal bleeding as well.  She had a colonoscopy at [facility] in 2014.  Showed diverticulosis.  It is of note that patient has poor dentition with only a couple of teeth on the lower jaw.  He does not use a partial.    Current Facility-Administered Medications:  .  0.9 %  sodium chloride infusion, , Intravenous, Continuous, [name], MD, Last Rate: 20 mL/hr at 07/03/17 1053  Facility-Administered Medications Ordered in Other Encounters:  .  lidocaine (cardiac) 100 mg/23mL (XYLOCAINE) injection 2%, , , Anesthesia Intra-op, Eben Burow, CRNA, 50 mg at 07/03/17 1126  Medications Prior to Admission  Medication Sig Dispense Refill Last Dose  . albuterol (PROVENTIL HFA;VENTOLIN HFA) 108 (90 Base) MCG/ACT inhaler Inhale 1-2 puffs into the lungs every 6 (six) hours as needed for wheezing or shortness of breath. 1 Inhaler 0 Past Week at Unknown time  . cyclobenzaprine (FLEXERIL) 10 MG tablet Take 1 tablet (10 mg total) by mouth 3 (three) times daily as needed for muscle spasms. 90 tablet 0 07/02/2017 at Unknown time  . doxycycline (VIBRAMYCIN) 100 MG capsule Take 1 capsule (100 mg total) by mouth 2 (two) times daily. 14 capsule 0 07/03/2017 at 0600  . DULoxetine  (CYMBALTA) 30 MG capsule Take 30 mg by mouth daily.   07/03/2017 at 0600  . gabapentin (NEURONTIN) 300 MG capsule Take 1 capsule (300 mg total) by mouth 3 (three) times daily. (Patient taking differently: Take 300 mg by mouth at bedtime. 2-3 capsules at night.) 90 capsule 0 07/02/2017 at Unknown time  . hydroxychloroquine (PLAQUENIL) 200 MG tablet Take by mouth.   07/03/2017 at 0600  . oxyCODONE-acetaminophen (PERCOCET/ROXICET) 5-325 MG tablet Take 1 tablet by mouth every 6 (six) hours as needed for severe pain.   Past Week at Unknown time  . predniSONE (DELTASONE) 50 MG tablet 1 tablet daily x 5 days. 5 tablet 0 07/03/2017 at 0600  . DULoxetine (CYMBALTA) 30 MG capsule TAKE 1 CAPSULE(30 MG) BY MOUTH EVERY DAY   Taking  . oxyCODONE (OXY IR/ROXICODONE) 5 MG immediate release tablet Take 1 tablet (5 mg total) by mouth 2 (two) times daily. 60 tablet 0   . oxyCODONE (OXY IR/ROXICODONE) 5 MG immediate release tablet Take 1 tablet (5 mg total) by mouth 2 (two) times daily. 60 tablet 0   . [START ON 07/09/2017] oxyCODONE (OXY IR/ROXICODONE) 5 MG immediate release tablet Take 1 tablet (5 mg total) by mouth 2 (two) times daily. (Patient not taking: Reported on 07/03/2017) 60 tablet 0 Not Taking     Allergies  Allergen Reactions  . Trazodone Other (See Comments)    Agitation  . Bupropion Anxiety  . Methocarbamol Other (See Comments)    Agitation and stiffness  . Oxycodone-Acetaminophen  Other reaction(s): Nausea And Vomiting  . Tramadol Other (See Comments)    Other reaction(s): Nausea And Vomiting Reports she feels "high"  . Trazodone And Nefazodone Other (See Comments)    Agitation      Past Medical History:  Diagnosis Date  . Anxiety   . Bladder infection    8/18  . Chronic lower back pain   . Depression   . Hyperlipidemia   . Lupus ([place])   . Overactive bladder     Review of systems:      Physical Exam    Heart and lungs: Regular rate and rhythm without rub or gallop, right lung  is clear left lung has crackles and wheezes, particularly at the base.    HeentL: normocephalic atraumatic eyes are anicteric    Other:    Pertinant exam for procedure: Soft mild discomfort with palpation left lower quadrant.  Bowel sounds positive normoactive    Planned proceedures: Due to her upper respiratory issue we will hold on doing the EGD today.  We will proceed with the colonoscopy.  Case discussed with anesthesia. I have discussed the risks benefits and complications of procedures to include not limited to bleeding, infection, perforation and the risk of sedation and the patient wishes to proceed.    [name], MD Gastroenterology 07/03/2017  11:27 AM    Jenetta Downer

## 2017-07-03 NOTE — Anesthesia Preprocedure Evaluation (Signed)
Anesthesia Evaluation  Patient identified by MRN, date of birth, ID band Patient awake    Reviewed: Allergy & Precautions, H&P , NPO status , Patient's Chart, lab work & pertinent test results, reviewed documented beta blocker date and time   Airway Mallampati: II   Neck ROM: full    Dental  (+) Poor Dentition   Pulmonary neg pulmonary ROS, Current Smoker,    Pulmonary exam normal        Cardiovascular negative cardio ROS Normal cardiovascular exam Rhythm:regular Rate:Normal     Neuro/Psych PSYCHIATRIC DISORDERS Anxiety Depression  Neuromuscular disease negative neurological ROS  negative psych ROS   GI/Hepatic negative GI ROS, Neg liver ROS,   Endo/Other  negative endocrine ROS  Renal/GU negative Renal ROS  negative genitourinary   Musculoskeletal   Abdominal   Peds  Hematology negative hematology ROS (+)   Anesthesia Other Findings Past Medical History: No date: Anxiety No date: Bladder infection     Comment:  8/18 No date: Chronic lower back pain No date: Depression No date: Hyperlipidemia No date: Lupus ([place]) No date: Overactive bladder Past Surgical History: No date: ABDOMINAL HYSTERECTOMY No date: COLONOSCOPY BMI    Body Mass Index:  26.22 kg/m     Reproductive/Obstetrics negative OB ROS                             Anesthesia Physical Anesthesia Plan  ASA: III  Anesthesia Plan: General   Post-op Pain Management:    Induction:   PONV Risk Score and Plan:   Airway Management Planned:   Additional Equipment:   Intra-op Plan:   Post-operative Plan:   Informed Consent: I have reviewed the patients History and Physical, chart, labs and discussed the procedure including the risks, benefits and alternatives for the proposed anesthesia with the patient or authorized representative who has indicated his/her understanding and acceptance.   Dental Advisory  Given  Plan Discussed with: CRNA  Anesthesia Plan Comments:         Anesthesia Quick Evaluation

## 2017-07-03 NOTE — Anesthesia Post-op Follow-up Note (Signed)
Anesthesia QCDR form completed.        

## 2017-07-03 NOTE — Op Note (Signed)
[facility] Patient Name: Tanya [name] Procedure Date: 07/03/2017 11:13 AM MRN: [id number] Account #: [id number] Date of Birth: [birth year] Admit Type: Outpatient Age: 57 Room: [facility] ENDO ROOM 1 Gender: Female Note Status: Finalized Procedure:            Colonoscopy Providers:            [name], MD Referring MD:         Ricardo Jericho (Referring MD) Complications:        No immediate complications. Procedure:            Pre-Anesthesia Assessment:                       - ASA Grade Assessment: III - A patient with severe                        systemic disease.                       After obtaining informed consent, the colonoscope was                        passed under direct vision. Throughout the procedure,                        the patient's blood pressure, pulse, and oxygen                        saturations were monitored continuously. The                        Colonoscope was introduced through the anus with the                        intention of advancing to the cecum. The scope was                        advanced to the rectum before the procedure was                        aborted. Medications were given. The patient tolerated                        the procedure poorly due to the patient's respiratory                        instability. Findings:      The scope was introduced to about the rectosigmoid junction, when       patient bagan to have alot of coughing, then productive of sputum, and       could not be sedated further or proceed with scope. Procedure aborted.      Procedure aborted. Impression:           - No specimens collected. Recommendation:       - Discharge patient to home.                       - Referral back to PMD for further evaluation and                        treatment.  Follow up in GI in one month, once                        respiratory issue improved. Procedure Code(s):    ---  Professional ---                       [phone], 18, Colonoscopy, flexible; diagnostic, including                        collection of specimen(s) by brushing or washing, when                        performed (separate procedure) CPT copyright 2017 [employer]. All rights reserved. The codes documented in this report are preliminary and upon coder review may  be revised to meet current compliance requirements. [name], MD 07/03/2017 11:51:41 AM This report has been signed electronically. Number of Addenda: 0 Note Initiated On: 07/03/2017 11:13 AM Total Procedure Duration: 0 hours 5 minutes 53 seconds       [facility]

## 2017-07-03 NOTE — Transfer of Care (Signed)
Immediate Anesthesia Transfer of Care Note  Patient: Tanya [name]  Procedure(s) Performed: COLONOSCOPY WITH PROPOFOL (N/A )  Patient Location: PACU and Endoscopy Unit  Anesthesia Type:General  Level of Consciousness: awake, alert , oriented and patient cooperative  Airway & Oxygen Therapy: Patient Spontanous Breathing and Patient connected to nasal cannula oxygen  Post-op Assessment: Report given to RN and Post -op Vital signs reviewed and stable  Post vital signs: Reviewed and stable  Last Vitals:  Vitals Value Taken Time  BP 159/118 07/03/2017 11:52 AM  Temp    Pulse 134 07/03/2017 11:52 AM  Resp 19 07/03/2017 11:52 AM  SpO2 99 % 07/03/2017 11:52 AM  Vitals shown include unvalidated device data.  Last Pain:  Vitals:   07/03/17 1031  TempSrc: Tympanic  PainSc: 0-No pain         Complications: No apparent anesthesia complications

## 2017-07-06 NOTE — Anesthesia Postprocedure Evaluation (Signed)
Anesthesia Post Note  Patient: Tanya [name]  Procedure(s) Performed: COLONOSCOPY WITH PROPOFOL (N/A )  Patient location during evaluation: PACU Anesthesia Type: General Level of consciousness: awake and alert Pain management: pain level controlled Vital Signs Assessment: post-procedure vital signs reviewed and stable Respiratory status: spontaneous breathing, nonlabored ventilation, respiratory function stable and patient connected to nasal cannula oxygen Cardiovascular status: blood pressure returned to baseline and stable Postop Assessment: no apparent nausea or vomiting Anesthetic complications: no     Last Vitals:  Vitals:   07/03/17 1210 07/03/17 1220  BP: 116/63 114/70  Pulse: 89 89  Resp: 16 20  Temp:    SpO2: 100% 100%    Last Pain:  Vitals:   07/03/17 1031  TempSrc: Tympanic  PainSc: 0-No pain                 Molli Barrows

## 2017-07-09 ENCOUNTER — Encounter: Payer: Self-pay | Admitting: Gastroenterology

## 2017-07-30 ENCOUNTER — Other Ambulatory Visit: Payer: Self-pay

## 2017-07-30 ENCOUNTER — Encounter: Payer: Self-pay | Admitting: Nurse Practitioner

## 2017-07-30 ENCOUNTER — Ambulatory Visit: Payer: Medicaid Other | Attending: Nurse Practitioner | Admitting: Nurse Practitioner

## 2017-07-30 VITALS — BP 116/72 | HR 72 | Temp 97.8°F | Resp 18 | Ht 63.0 in | Wt 150.0 lb

## 2017-07-30 DIAGNOSIS — E7800 Pure hypercholesterolemia, unspecified: Secondary | ICD-10-CM | POA: Diagnosis not present

## 2017-07-30 DIAGNOSIS — Z823 Family history of stroke: Secondary | ICD-10-CM | POA: Diagnosis not present

## 2017-07-30 DIAGNOSIS — G894 Chronic pain syndrome: Secondary | ICD-10-CM | POA: Insufficient documentation

## 2017-07-30 DIAGNOSIS — Z9889 Other specified postprocedural states: Secondary | ICD-10-CM | POA: Diagnosis not present

## 2017-07-30 DIAGNOSIS — F411 Generalized anxiety disorder: Secondary | ICD-10-CM | POA: Insufficient documentation

## 2017-07-30 DIAGNOSIS — Z8249 Family history of ischemic heart disease and other diseases of the circulatory system: Secondary | ICD-10-CM | POA: Insufficient documentation

## 2017-07-30 DIAGNOSIS — G8929 Other chronic pain: Secondary | ICD-10-CM | POA: Diagnosis not present

## 2017-07-30 DIAGNOSIS — M25512 Pain in left shoulder: Secondary | ICD-10-CM | POA: Diagnosis not present

## 2017-07-30 DIAGNOSIS — Z79891 Long term (current) use of opiate analgesic: Secondary | ICD-10-CM | POA: Insufficient documentation

## 2017-07-30 DIAGNOSIS — Z836 Family history of other diseases of the respiratory system: Secondary | ICD-10-CM | POA: Diagnosis not present

## 2017-07-30 DIAGNOSIS — R32 Unspecified urinary incontinence: Secondary | ICD-10-CM | POA: Insufficient documentation

## 2017-07-30 DIAGNOSIS — Z809 Family history of malignant neoplasm, unspecified: Secondary | ICD-10-CM | POA: Insufficient documentation

## 2017-07-30 DIAGNOSIS — Z888 Allergy status to other drugs, medicaments and biological substances status: Secondary | ICD-10-CM | POA: Diagnosis not present

## 2017-07-30 DIAGNOSIS — Z79899 Other long term (current) drug therapy: Secondary | ICD-10-CM | POA: Insufficient documentation

## 2017-07-30 DIAGNOSIS — M797 Fibromyalgia: Secondary | ICD-10-CM

## 2017-07-30 DIAGNOSIS — E559 Vitamin D deficiency, unspecified: Secondary | ICD-10-CM | POA: Diagnosis not present

## 2017-07-30 DIAGNOSIS — G8918 Other acute postprocedural pain: Secondary | ICD-10-CM | POA: Diagnosis not present

## 2017-07-30 DIAGNOSIS — Z833 Family history of diabetes mellitus: Secondary | ICD-10-CM | POA: Insufficient documentation

## 2017-07-30 DIAGNOSIS — M47812 Spondylosis without myelopathy or radiculopathy, cervical region: Secondary | ICD-10-CM

## 2017-07-30 DIAGNOSIS — M479 Spondylosis, unspecified: Secondary | ICD-10-CM | POA: Insufficient documentation

## 2017-07-30 DIAGNOSIS — M25511 Pain in right shoulder: Secondary | ICD-10-CM | POA: Insufficient documentation

## 2017-07-30 DIAGNOSIS — N3281 Overactive bladder: Secondary | ICD-10-CM | POA: Diagnosis not present

## 2017-07-30 DIAGNOSIS — M5412 Radiculopathy, cervical region: Secondary | ICD-10-CM | POA: Insufficient documentation

## 2017-07-30 DIAGNOSIS — Z90710 Acquired absence of both cervix and uterus: Secondary | ICD-10-CM | POA: Insufficient documentation

## 2017-07-30 DIAGNOSIS — M7918 Myalgia, other site: Secondary | ICD-10-CM

## 2017-07-30 DIAGNOSIS — F17210 Nicotine dependence, cigarettes, uncomplicated: Secondary | ICD-10-CM | POA: Diagnosis not present

## 2017-07-30 DIAGNOSIS — M329 Systemic lupus erythematosus, unspecified: Secondary | ICD-10-CM | POA: Diagnosis not present

## 2017-07-30 MED ORDER — OXYCODONE HCL 5 MG PO TABS: 5.0000 mg | ORAL_TABLET | Freq: Two times a day (BID) | ORAL | 0 refills | Status: DC

## 2017-07-30 MED ORDER — CYCLOBENZAPRINE HCL 10 MG PO TABS: 10.0000 mg | ORAL_TABLET | Freq: Three times a day (TID) | ORAL | 0 refills | Status: DC | PRN

## 2017-07-30 MED ORDER — GABAPENTIN 300 MG PO CAPS: 300.0000 mg | ORAL_CAPSULE | Freq: Three times a day (TID) | ORAL | 0 refills | Status: DC

## 2017-07-30 NOTE — Progress Notes (Signed)
Nursing Pain Medication Assessment:  Safety precautions to be maintained throughout the outpatient stay will include: orient to surroundings, keep bed in low position, maintain call bell within reach at all times, provide assistance with transfer out of bed and ambulation.  Medication Inspection Compliance: Pill count conducted under aseptic conditions, in front of the patient. Neither the pills nor the bottle was removed from the patient's sight at any time. Once count was completed pills were immediately returned to the patient in their original bottle.  Medication: Oxycodone IR Pill/Patch Count: 6 of 60 pills remain Pill/Patch Appearance: Markings consistent with prescribed medication Bottle Appearance: Standard pharmacy container. Clearly labeled. Filled Date: 06 / 19 / 2019 Last Medication intake:  Yesterday

## 2017-07-30 NOTE — Patient Instructions (Addendum)
Rx for Flexeril and Gabapentin have been escribed to your pharmacy. You have been given 3 Rx for Oxycodone IR to last until 11/06/2017.____________________________________________________________________________________________  Medication Rules  Applies to: All patients receiving prescriptions (written or electronic).  Pharmacy of record: Pharmacy where electronic prescriptions will be sent. If written prescriptions are taken to a different pharmacy, please inform the nursing staff. The pharmacy listed in the electronic medical record should be the one where you would like electronic prescriptions to be sent.  Prescription refills: Only during scheduled appointments. Applies to both, written and electronic prescriptions.  NOTE: The following applies primarily to controlled substances (Opioid* Pain Medications).   Patient's responsibilities: 1. Pain Pills: Bring all pain pills to every appointment (except for procedure appointments). 2. Pill Bottles: Bring pills in original pharmacy bottle. Always bring newest bottle. Bring bottle, even if empty. 3. Medication refills: You are responsible for knowing and keeping track of what medications you need refilled. The day before your appointment, write a list of all prescriptions that need to be refilled. Bring that list to your appointment and give it to the admitting nurse. Prescriptions will be written only during appointments. If you forget a medication, it will not be "Called in", "Faxed", or "electronically sent". You will need to get another appointment to get these prescribed. 4. Prescription Accuracy: You are responsible for carefully inspecting your prescriptions before leaving our office. Have the discharge nurse carefully go over each prescription with you, before taking them home. Make sure that your name is accurately spelled, that your address is correct. Check the name and dose of your medication to make sure it is accurate. Check the number  of pills, and the written instructions to make sure they are clear and accurate. Make sure that you are given enough medication to last until your next medication refill appointment. 5. Taking Medication: Take medication as prescribed. Never take more pills than instructed. Never take medication more frequently than prescribed. Taking less pills or less frequently is permitted and encouraged, when it comes to controlled substances (written prescriptions).  6. Inform other Doctors: Always inform, all of your healthcare providers, of all the medications you take. 7. Pain Medication from other Providers: You are not allowed to accept any additional pain medication from any other Doctor or Healthcare provider. There are two exceptions to this rule. (see below) In the event that you require additional pain medication, you are responsible for notifying us, as stated below. 8. Medication Agreement: You are responsible for carefully reading and following our Medication Agreement. This must be signed before receiving any prescriptions from our practice. Safely store a copy of your signed Agreement. Violations to the Agreement will result in no further prescriptions. (Additional copies of our Medication Agreement are available upon request.) 9. Laws, Rules, & Regulations: All patients are expected to follow all Federal and [employer], Rules, [employer]. Ignorance of the Laws does not constitute a valid excuse. The use of any illegal substances is prohibited. 10. Adopted CDC guidelines & recommendations: Target dosing levels will be at or below 60 MME/day. Use of benzodiazepines** is not recommended.  Exceptions: There are only two exceptions to the rule of not receiving pain medications from other Healthcare Providers. 1. Exception #1 (Emergencies): In the event of an emergency (i.e.: accident requiring emergency care), you are allowed to receive additional pain medication. However, you are responsible  for: As soon as you are able, call our office [phone], at any time of the day or  night, and leave a message stating your name, the date and nature of the emergency, and the name and dose of the medication prescribed. In the event that your call is answered by a member of our staff, make sure to document and save the date, time, and the name of the person that took your information.  2. Exception #2 (Planned Surgery): In the event that you are scheduled by another doctor or dentist to have any type of surgery or procedure, you are allowed (for a period no longer than 30 days), to receive additional pain medication, for the acute post-op pain. However, in this case, you are responsible for picking up a copy of our "Post-op Pain Management for Surgeons" handout, and giving it to your surgeon or dentist. This document is available at our office, and does not require an appointment to obtain it. Simply go to our office during business hours (Monday-Thursday from 8:00 AM to 4:00 PM) (Friday 8:00 AM to 12:00 Noon) or if you have a scheduled appointment with [place], prior to your surgery, and ask for it by name. In addition, you will need to provide [place] with your name, name of your surgeon, type of surgery, and date of procedure or surgery.  *Opioid medications include: morphine, codeine, oxycodone, oxymorphone, hydrocodone, hydromorphone, meperidine, tramadol, tapentadol, buprenorphine, fentanyl, methadone. **Benzodiazepine medications include: diazepam (Valium), alprazolam (Xanax), clonazepam (Klonopine), lorazepam (Ativan), clorazepate (Tranxene), chlordiazepoxide (Librium), estazolam (Prosom), oxazepam (Serax), temazepam (Restoril), triazolam (Halcion) (Last updated: 03/20/2017) ____________________________________________________________________________________________

## 2017-07-30 NOTE — Progress Notes (Signed)
Patient's Name: Tanya [name]  MRN: [id number]  Referring Provider: Ricardo Jericho*  DOB: 1960/11/26  PCP: Ricardo Jericho, NP  DOS: 07/30/2017  Note by: Vevelyn Francois NP  Service setting: Ambulatory outpatient  Specialty: Interventional Pain Management  Location: [facility] (AMB) Pain Management Facility    Patient type: Established    Primary Reason(s) for Visit: Encounter for prescription drug management. (Level of risk: moderate)  CC: Neck Pain and Back Pain (upper)  HPI  Tanya [name] is a 57 y.o. year old, female patient, who comes today for a medication management evaluation. She has Anxiety; Chronic upper back pain (Secondary source of pain) (Bilateral) (R>L); Chronic discoid lupus erythematosus; Chronic low back pain [facility] source of pain) (Bilateral) (R>L); Chronic neck pain (Primary Source of Pain) (Bilateral) (R>L); Discoid lupus; Diverticulitis; Encounter for long-term (current) use of high-risk medication; Fibromyalgia; Generalized anxiety disorder; Hypercholesterolemia; Hyperlipidemia; Major depressive disorder, recurrent ([place]); Pain medication agreement signed; Osteoarthritis; SLE (systemic lupus erythematosus) ([place]); Urinary incontinence; Long term prescription benzodiazepine use; Long term current use of opiate analgesic; Long term prescription opiate use; Opiate use (10 MME/Day); Chronic pain syndrome; Chronic upper extremity pain (Right); Chronic cervical radicular pain (Right); Chronic shoulder pain (Bilateral) (R>L); 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 Her primarily concern today is the Neck Pain and Back Pain (upper)  Pain Assessment: Location:   Neck Radiating: upper back Onset: More than a month ago Duration: Chronic pain Quality:  Constant, Burning Severity: 5 /10 (subjective, self-reported pain score)  Note: Reported level is compatible with observation.                          Timing: Constant Modifying factors: medications, injections, rest, heat , ice BP: 116/72  HR: 72  Tanya [name] was last scheduled for an appointment on 07/02/2017 for medication management. During today's appointment we reviewed Tanya [name]'s chronic pain status, as well as her outpatient medication regimen. She admits that her pain continues. She is scheduled for cervical Facet nerve block. She denies any concerns today. She denies any side effects of her medication.  The patient  reports that she does not use drugs. Her body mass index is 26.57 kg/m.  Further details on both, my assessment(s), as well as the proposed treatment plan, please see below.  Controlled Substance Pharmacotherapy Assessment REMS (Risk Evaluation and Mitigation Strategy)  Analgesic:Oxycodone IR 5 mg 1 tablet by mouth twice a day (10 mg/dayof oxycodone). MME/day:66m/day.    SHart Rochester RN  07/30/2017 10:27 AM  Sign at close encounter Nursing Pain Medication Assessment:  Safety precautions to be maintained throughout the outpatient stay will include: orient to surroundings, keep bed in low position, maintain call bell within reach at all times, provide assistance with transfer out of bed and ambulation.  Medication Inspection Compliance: Pill count conducted under aseptic conditions, in front of the patient. Neither the pills nor the bottle was removed from the patient's sight at any time. Once count was completed pills were immediately returned to the patient in their original bottle.  Medication: Oxycodone IR Pill/Patch Count: 6 of 60 pills remain Pill/Patch Appearance: Markings consistent with prescribed medication Bottle Appearance: Standard pharmacy container. Clearly labeled. Filled Date: 06 / 19 / 2019 Last Medication intake:  Yesterday    Pharmacokinetics: Liberation and absorption (onset of action):  WNL Distribution (time to peak effect): WNL Metabolism and excretion (duration of action): WNL         Pharmacodynamics: Desired effects: Analgesia: Tanya [name] reports >50% benefit. Functional ability: Patient reports that medication allows her to accomplish basic ADLs Clinically meaningful improvement in function (CMIF): Sustained CMIF goals met Perceived effectiveness: Described as relatively effective, allowing for increase in activities of daily living (ADL) Undesirable effects: Side-effects or Adverse reactions: None reported Monitoring: [place] [address] PMP: Online review of the past 69-monthperiod conducted. Compliant with practice rules and regulations Last UDS on record: Summary  Date Value Ref Range Status  04/08/2017 FINAL  Final    Comment:    ==================================================================== TOXASSURE SELECT 13 (MW) ==================================================================== Test                             Result       Flag       Units Drug Absent but Declared for Prescription Verification   Oxycodone                      Not Detected UNEXPECTED ng/mg creat   Tramadol                       Not Detected UNEXPECTED ng/mg creat ==================================================================== Test                      Result    Flag   Units      Ref Range   Creatinine              32               mg/dL      >=20 ==================================================================== Declared Medications:  The flagging and interpretation on this report are based on the  following declared medications.  Unexpected results may arise from  inaccuracies in the declared medications.  **Note: The testing scope of this panel includes these medications:  Oxycodone  Oxycodone (Oxycodone Acetaminophen)  Tramadol  **Note: The testing scope of this panel does not include following  reported  medications:  Acetaminophen (Oxycodone Acetaminophen)  Bupropion  Cyclobenzaprine  Duloxetine (Cymbalta)  Gabapentin  Methocarbamol  Trazodone ==================================================================== For clinical consultation, please call ([phone] ====================================================================    UDS interpretation: Compliant          Medication Assessment Form: Reviewed. Patient indicates being compliant with therapy Treatment compliance: Compliant Risk Assessment Profile: Aberrant behavior: See prior evaluations. None observed or detected today Comorbid factors increasing risk of overdose: See prior notes. No additional risks detected today Risk of substance use disorder (SUD): Low Opioid Risk Tool - 07/02/17 1115      Family History of Substance Abuse   Alcohol  Negative    Illegal Drugs  Negative    Rx Drugs  Negative      Personal History of Substance Abuse   Alcohol  Negative    Illegal Drugs  Negative    Rx Drugs  Negative      Age   Age between 157-45years   No      Psychological Disease   Psychological Disease  Negative    Depression  Positive      Total Score   Opioid Risk Tool Scoring  1    Opioid Risk Interpretation  Low Risk      ORT Scoring interpretation table:  Score <3 = Low Risk for  SUD  Score between 4-7 = Moderate Risk for SUD  Score >8 = High Risk for Opioid Abuse   Risk Mitigation Strategies:  Patient Counseling: Covered Patient-Prescriber Agreement (PPA): Present and active  Notification to other healthcare providers: Done  Pharmacologic Plan: No change in therapy, at this time.             Laboratory Chemistry  Inflammation Markers (CRP: Acute Phase) (ESR: Chronic Phase) Lab Results  Component Value Date   CRP 6.0 (H) 07/11/2016   ESRSEDRATE 7 07/11/2016                         Rheumatology Markers No results found for: RF, ANA, LABURIC, URICUR, LYMEIGGIGMAB, LYMEABIGMQN, HLAB27                       Renal Function Markers Lab Results  Component Value Date   BUN 14 09/08/2016   CREATININE 0.85 09/08/2016   BCR 16 07/11/2016   GFRAA >60 09/08/2016   GFRNONAA >60 09/08/2016                             Hepatic Function Markers Lab Results  Component Value Date   AST 21 09/08/2016   ALT 16 09/08/2016   ALBUMIN 4.4 09/08/2016   ALKPHOS 36 (L) 09/08/2016   LIPASE 37 09/08/2016                        Electrolytes Lab Results  Component Value Date   NA 137 09/08/2016   K 3.4 (L) 09/08/2016   CL 102 09/08/2016   CALCIUM 9.2 09/08/2016   MG 2.2 07/11/2016                        Neuropathy Markers Lab Results  Component Value Date   VITAMINB12 310 07/11/2016                        Bone Pathology Markers Lab Results  Component Value Date   25OHVITD1 9.6 (L) 07/11/2016   25OHVITD2 <1.0 07/11/2016   25OHVITD3 9.4 07/11/2016                         Coagulation Parameters Lab Results  Component Value Date   PLT 375 09/08/2016                        Cardiovascular Markers Lab Results  Component Value Date   HGB 13.4 09/08/2016   HCT 40.0 09/08/2016                         CA Markers No results found for: CEA, CA125, LABCA2                      Note: Lab results reviewed.  Recent Diagnostic Imaging Results  DG Esophagus CLINICAL DATA:  Chronic dysphagia with worsening symptoms x last few weeks, states solids and liquids "just won't go down", no difficulty with medications, known issues with reflux.  EXAM: ESOPHOGRAM / BARIUM SWALLOW / BARIUM TABLET STUDY  TECHNIQUE: Combined double contrast and single contrast examination performed using effervescent crystals, thick barium liquid, and thin barium liquid. The patient was observed with fluoroscopy swallowing a 13 mm barium  sulphate tablet.  FLUOROSCOPY TIME:  Fluoroscopy Time:  0.8 minute  Radiation Exposure Index (if provided by the fluoroscopic device): 4 mGy  Number of Acquired Spot  Images: 0  COMPARISON:  None.  FINDINGS: There was normal pharyngeal anatomy and motility. Contrast flowed freely through the esophagus without evidence of stricture or mass. There was normal esophageal mucosa without evidence of irregularity or ulceration. Esophageal motility was normal. No evidence of reflux. No definite hiatal hernia was demonstrated.  At the end of the examination a 13 mm barium tablet was administered which transited through the esophagus and esophagogastric junction without delay.  IMPRESSION: 1. Normal barium swallow.  Electronically Signed   By: Kathreen Devoid   On: 05/27/2017 09:37  Complexity Note: Imaging results reviewed. Results shared with Ms. Asby, using Layman's terms.                         Meds   Current Outpatient Medications:  .  albuterol (PROVENTIL HFA;VENTOLIN HFA) 108 (90 Base) MCG/ACT inhaler, Inhale 1-2 puffs into the lungs every 6 (six) hours as needed for wheezing or shortness of breath., Disp: 1 Inhaler, Rfl: 0 .  [START ON 08/08/2017] cyclobenzaprine (FLEXERIL) 10 MG tablet, Take 1 tablet (10 mg total) by mouth 3 (three) times daily as needed for muscle spasms., Disp: 90 tablet, Rfl: 0 .  DULoxetine (CYMBALTA) 30 MG capsule, Take 30 mg by mouth daily., Disp: , Rfl:  .  [START ON 08/08/2017] gabapentin (NEURONTIN) 300 MG capsule, Take 1 capsule (300 mg total) by mouth 3 (three) times daily., Disp: 90 capsule, Rfl: 0 .  hydroxychloroquine (PLAQUENIL) 200 MG tablet, Take by mouth., Disp: , Rfl:  .  omeprazole (PRILOSEC) 40 MG capsule, Take 40 mg by mouth daily., Disp: , Rfl:  .  [START ON 08/08/2017] oxyCODONE (OXY IR/ROXICODONE) 5 MG immediate release tablet, Take 1 tablet (5 mg total) by mouth 2 (two) times daily., Disp: 60 tablet, Rfl: 0 .  DULoxetine (CYMBALTA) 30 MG capsule, TAKE 1 CAPSULE(30 MG) BY MOUTH EVERY DAY, Disp: , Rfl:  .  [START ON 10/07/2017] oxyCODONE (OXY IR/ROXICODONE) 5 MG immediate release tablet, Take 1 tablet (5  mg total) by mouth 2 (two) times daily., Disp: 60 tablet, Rfl: 0 .  [START ON 09/07/2017] oxyCODONE (OXY IR/ROXICODONE) 5 MG immediate release tablet, Take 1 tablet (5 mg total) by mouth 2 (two) times daily., Disp: 60 tablet, Rfl: 0  ROS  Constitutional: Denies any fever or chills Gastrointestinal: No reported hemesis, hematochezia, vomiting, or acute GI distress Musculoskeletal: Denies any acute onset joint swelling, redness, loss of ROM, or weakness Neurological: No reported episodes of acute onset apraxia, aphasia, dysarthria, agnosia, amnesia, paralysis, loss of coordination, or loss of consciousness  Allergies  Ms. Obando is allergic to trazodone; bupropion; methocarbamol; oxycodone-acetaminophen; tramadol; and trazodone and nefazodone.  [place]  Drug: Ms. Ferrin  reports that she does not use drugs. Alcohol:  reports that she does not drink alcohol. Tobacco:  reports that she has been smoking cigarettes.  She has been smoking about 0.50 packs per day. She has never used smokeless tobacco. Medical:  has a past medical history of Anxiety, Bladder infection, Chronic lower back pain, Depression, Hyperlipidemia, Lupus ([place]), and Overactive bladder. Surgical: Ms. Napp  has a past surgical history that includes Abdominal hysterectomy; Colonoscopy; and Colonoscopy with propofol (N/A, 07/03/2017). Family: family history includes Cancer in her mother; Diabetes in her brother; Emphysema in her mother; Glaucoma in her father;  Heart disease in her father; Hypertension in her mother; Stroke in her mother.  Constitutional Exam  General appearance: Well nourished, well developed, and well hydrated. In no apparent acute distress Vitals:   07/30/17 1019  BP: 116/72  Pulse: 72  Resp: 18  Temp: 97.8 F (36.6 C)  TempSrc: Oral  SpO2: 97%  Weight: 150 lb (68 kg)  Height: _0  (1.6 m)   BMI Assessment: Estimated body mass index is 26.57 kg/m as calculated from the following:   Height as  of this encounter: _1  (1.6 m).   Weight as of this encounter: 150 lb (68 kg). Psych/Mental status: Alert, oriented x 3 (person, place, & time)       Eyes: PERLA Respiratory: No evidence of acute respiratory distress  Cervical Spine Area Exam  Skin & Axial Inspection: No masses, redness, edema, swelling, or associated skin lesions Alignment: Symmetrical Functional ROM: Unrestricted ROM      Stability: No instability detected Muscle Tone/Strength: Functionally intact. No obvious neuro-muscular anomalies detected. Sensory (Neurological): Unimpaired Palpation: No palpable anomalies              Upper Extremity (UE) Exam    Side: Right upper extremity  Side: Left upper extremity  Skin & Extremity Inspection: Skin color, temperature, and hair growth are WNL. No peripheral edema or cyanosis. No masses, redness, swelling, asymmetry, or associated skin lesions. No contractures.  Skin & Extremity Inspection: Skin color, temperature, and hair growth are WNL. No peripheral edema or cyanosis. No masses, redness, swelling, asymmetry, or associated skin lesions. No contractures.  Functional ROM: Unrestricted ROM          Functional ROM: Unrestricted ROM          Muscle Tone/Strength: Functionally intact. No obvious neuro-muscular anomalies detected.  Muscle Tone/Strength: Functionally intact. No obvious neuro-muscular anomalies detected.  Sensory (Neurological): Unimpaired          Sensory (Neurological): Unimpaired          Palpation: No palpable anomalies              Palpation: No palpable anomalies              Provocative Test(s):  Phalen's test: deferred Tinel's test: deferred Apley's scratch test (touch opposite shoulder):  Action 1 (Across chest): deferred Action 2 (Overhead): deferred Action 3 (LB reach): deferred   Provocative Test(s):  Phalen's test: deferred Tinel's test: deferred Apley's scratch test (touch opposite shoulder):  Action 1 (Across chest): deferred Action 2 (Overhead):  deferred Action 3 (LB reach): deferred    Gait & Posture Assessment  Ambulation: Unassisted Gait: Relatively normal for age and body habitus Posture: WNL    Assessment  Primary Diagnosis & Pertinent Problem List: The primary encounter diagnosis was Cervical spondylosis. Diagnoses of Cervical facet syndrome (Bilateral) (R>L), Chronic shoulder pain (Bilateral) (R>L), Fibromyalgia, Chronic pain syndrome, and Musculoskeletal pain were also pertinent to this visit.  Status Diagnosis  Persistent Persistent Persistent 1. Cervical spondylosis   2. Cervical facet syndrome (Bilateral) (R>L)   3. Chronic shoulder pain (Bilateral) (R>L)   4. Fibromyalgia   5. Chronic pain syndrome   6. Musculoskeletal pain     Problems updated and reviewed during this visit: No problems updated. Plan of Care  Pharmacotherapy (Medications Ordered): Meds ordered this encounter  Medications  . oxyCODONE (OXY IR/ROXICODONE) 5 MG immediate release tablet    Sig: Take 1 tablet (5 mg total) by mouth 2 (two) times daily.    Dispense:  60 tablet    Refill:  0    Do not place this medication, or any other prescription from our practice, on "Automatic Refill". Patient may have prescription filled one day early if pharmacy is closed on scheduled refill date. Do not fill until: 10/07/2017 To last until: 11/06/2017    Order Specific Question:   Supervising Provider    Answer:   Milinda Pointer [phone]  . oxyCODONE (OXY IR/ROXICODONE) 5 MG immediate release tablet    Sig: Take 1 tablet (5 mg total) by mouth 2 (two) times daily.    Dispense:  60 tablet    Refill:  0    Do not place this medication, or any other prescription from our practice, on "Automatic Refill". Patient may have prescription filled one day early if pharmacy is closed on scheduled refill date. Do not fill until:09/07/2017 To last until:10/07/2017    Order Specific Question:   Supervising Provider    Answer:   Milinda Pointer [phone]  .  oxyCODONE (OXY IR/ROXICODONE) 5 MG immediate release tablet    Sig: Take 1 tablet (5 mg total) by mouth 2 (two) times daily.    Dispense:  60 tablet    Refill:  0    Do not place this medication, or any other prescription from our practice, on "Automatic Refill". Patient may have prescription filled one day early if pharmacy is closed on scheduled refill date. Do not fill until:08/08/2017 To last until:09/07/2017    Order Specific Question:   Supervising Provider    Answer:   Milinda Pointer [phone]  . gabapentin (NEURONTIN) 300 MG capsule    Sig: Take 1 capsule (300 mg total) by mouth 3 (three) times daily.    Dispense:  90 capsule    Refill:  0    Do not add this medication to the electronic "Automatic Refill" notification system. Patient may have prescription filled one day early if pharmacy is closed on scheduled refill date.    Order Specific Question:   Supervising Provider    Answer:   Milinda Pointer [phone]  . cyclobenzaprine (FLEXERIL) 10 MG tablet    Sig: Take 1 tablet (10 mg total) by mouth 3 (three) times daily as needed for muscle spasms.    Dispense:  90 tablet    Refill:  0    Do not add this medication to the electronic "Automatic Refill" notification system. Patient may have prescription filled one day early if pharmacy is closed on scheduled refill date.    Order Specific Question:   Supervising Provider    Answer:   Milinda Pointer [[id number]]   New Prescriptions   No medications on file   Medications administered today: Alayziah L. Sales had no medications administered during this visit. Lab-work, procedure(s), and/or referral(s): No orders of the defined types were placed in this encounter.  Imaging and/or referral(s): None  Interventional therapies: Planned, scheduled, and/or pending: Diagnostic bilateral cervical facet block   Considering:  Diagnostic bilateral intra-articular shoulder joint injection Diagnostic bilateral suprascapular  nerve block Possible bilateral suprascapular nerve RFA Right-sided cervical epidural steroid injection(Series #2) Diagnostic bilateral cervical facet block Possible bilateral cervical facet radiofrequencyablation  Diagnostic bilateral lumbar facet block Possible bilateral lumbar facet radiofrequency ablation    Palliative PRN treatment(s):  Right-sided cervical epidural steroid injection(Series      Provider-requested follow-up: Return in about 3 months (around 10/30/2017) for MedMgmt with Me Dionisio David).  Future Appointments  Date Time Provider [place]  08/05/2017  9:45 AM Milinda Pointer,  MD [facility] None  10/30/2017 10:30 AM Vevelyn Francois, NP [facility] None   Primary Care Physician: Ricardo Jericho, NP Location: [facility] Outpatient Pain Management Facility Note by: Vevelyn Francois NP Date: 07/30/2017; Time: 8:44 AM  Pain Score Disclaimer: We use the NRS-11 scale. This is a self-reported, subjective measurement of pain severity with only modest accuracy. It is used primarily to identify changes within a particular patient. It must be understood that outpatient pain scales are significantly less accurate that those used for research, where they can be applied under ideal controlled circumstances with minimal exposure to variables. In reality, the score is likely to be a combination of pain intensity and pain affect, where pain affect describes the degree of emotional arousal or changes in action readiness caused by the sensory experience of pain. Factors such as social and work situation, setting, emotional state, anxiety levels, expectation, and prior pain experience may influence pain perception and show large inter-individual differences that may also be affected by time variables.  Patient instructions provided during this appointment: Patient Instructions  Rx for Flexeril and Gabapentin have been escribed to your pharmacy. You have been given 3 Rx  for Oxycodone IR to last until 11/06/2017.____________________________________________________________________________________________  Medication Rules  Applies to: All patients receiving prescriptions (written or electronic).  Pharmacy of record: Pharmacy where electronic prescriptions will be sent. If written prescriptions are taken to a different pharmacy, please inform the nursing staff. The pharmacy listed in the electronic medical record should be the one where you would like electronic prescriptions to be sent.  Prescription refills: Only during scheduled appointments. Applies to both, written and electronic prescriptions.  NOTE: The following applies primarily to controlled substances (Opioid* Pain Medications).   Patient's responsibilities: 1. Pain Pills: Bring all pain pills to every appointment (except for procedure appointments). 2. Pill Bottles: Bring pills in original pharmacy bottle. Always bring newest bottle. Bring bottle, even if empty. 3. Medication refills: You are responsible for knowing and keeping track of what medications you need refilled. The day before your appointment, write a list of all prescriptions that need to be refilled. Bring that list to your appointment and give it to the admitting nurse. Prescriptions will be written only during appointments. If you forget a medication, it will not be "Called in", "Faxed", or "electronically sent". You will need to get another appointment to get these prescribed. 4. Prescription Accuracy: You are responsible for carefully inspecting your prescriptions before leaving our office. Have the discharge nurse carefully go over each prescription with you, before taking them home. Make sure that your name is accurately spelled, that your address is correct. Check the name and dose of your medication to make sure it is accurate. Check the number of pills, and the written instructions to make sure they are clear and accurate. Make sure  that you are given enough medication to last until your next medication refill appointment. 5. Taking Medication: Take medication as prescribed. Never take more pills than instructed. Never take medication more frequently than prescribed. Taking less pills or less frequently is permitted and encouraged, when it comes to controlled substances (written prescriptions).  6. Inform other Doctors: Always inform, all of your healthcare providers, of all the medications you take. 7. Pain Medication from other Providers: You are not allowed to accept any additional pain medication from any other Doctor or Healthcare provider. There are two exceptions to this rule. (see below) In the event that you require additional pain medication, you are responsible  for notifying us, as stated below. 8. Medication Agreement: You are responsible for carefully reading and following our Medication Agreement. This must be signed before receiving any prescriptions from our practice. Safely store a copy of your signed Agreement. Violations to the Agreement will result in no further prescriptions. (Additional copies of our Medication Agreement are available upon request.) 9. Laws, Rules, & Regulations: All patients are expected to follow all Federal and [employer], Rules, [employer]. Ignorance of the Laws does not constitute a valid excuse. The use of any illegal substances is prohibited. 10. Adopted CDC guidelines & recommendations: Target dosing levels will be at or below 60 MME/day. Use of benzodiazepines** is not recommended.  Exceptions: There are only two exceptions to the rule of not receiving pain medications from other Healthcare Providers. 1. Exception #1 (Emergencies): In the event of an emergency (i.e.: accident requiring emergency care), you are allowed to receive additional pain medication. However, you are responsible for: As soon as you are able, call our office [phone], at any time of the day or  night, and leave a message stating your name, the date and nature of the emergency, and the name and dose of the medication prescribed. In the event that your call is answered by a member of our staff, make sure to document and save the date, time, and the name of the person that took your information.  2. Exception #2 (Planned Surgery): In the event that you are scheduled by another doctor or dentist to have any type of surgery or procedure, you are allowed (for a period no longer than 30 days), to receive additional pain medication, for the acute post-op pain. However, in this case, you are responsible for picking up a copy of our "Post-op Pain Management for Surgeons" handout, and giving it to your surgeon or dentist. This document is available at our office, and does not require an appointment to obtain it. Simply go to our office during business hours (Monday-Thursday from 8:00 AM to 4:00 PM) (Friday 8:00 AM to 12:00 Noon) or if you have a scheduled appointment with [place], prior to your surgery, and ask for it by name. In addition, you will need to provide [place] with your name, name of your surgeon, type of surgery, and date of procedure or surgery.  *Opioid medications include: morphine, codeine, oxycodone, oxymorphone, hydrocodone, hydromorphone, meperidine, tramadol, tapentadol, buprenorphine, fentanyl, methadone. **Benzodiazepine medications include: diazepam (Valium), alprazolam (Xanax), clonazepam (Klonopine), lorazepam (Ativan), clorazepate (Tranxene), chlordiazepoxide (Librium), estazolam (Prosom), oxazepam (Serax), temazepam (Restoril), triazolam (Halcion) (Last updated: 03/20/2017) ____________________________________________________________________________________________

## 2017-08-05 ENCOUNTER — Ambulatory Visit: Payer: Medicaid Other | Admitting: Pain Medicine

## 2017-08-05 DIAGNOSIS — M47812 Spondylosis without myelopathy or radiculopathy, cervical region: Secondary | ICD-10-CM | POA: Insufficient documentation

## 2017-08-05 DIAGNOSIS — M542 Cervicalgia: Secondary | ICD-10-CM | POA: Insufficient documentation

## 2017-08-05 NOTE — Progress Notes (Deleted)
NO SHOW

## 2017-08-21 ENCOUNTER — Ambulatory Visit: Payer: Medicaid Other | Admitting: Pain Medicine

## 2017-08-21 NOTE — Progress Notes (Deleted)
Patient's Name: Tanya [name]  MRN: [id number]  Referring Provider: Ricardo Jericho*  DOB: 09-28-1960  PCP: Ricardo Jericho, NP  DOS: 08/21/2017  Note by: Gaspar Cola, MD  Service setting: Ambulatory outpatient  Specialty: Interventional Pain Management  Patient type: Established  Location: [facility] (AMB) Pain Management Facility  Visit type: Interventional Procedure   Primary Reason for Visit: Interventional Pain Management Treatment. CC: No chief complaint on file.  Procedure:          Anesthesia, Analgesia, Anxiolysis:  Type: Cervical Facet Medial Branch Block(s) #1  Primary Purpose: Diagnostic Region: Posterolateral cervical spine Level: C3, C4, C5, C6, & C7 Medial Branch Level(s). Injecting these levels blocks the C3-4, C4-5, C5-6, and C6-7 cervical facet joints. Laterality: Bilateral  Type: Moderate (Conscious) Sedation combined with Local Anesthesia Indication(s): Analgesia and Anxiety Route: Intravenous (IV) IV Access: Secured Sedation: Meaningful verbal contact was maintained at all times during the procedure  Local Anesthetic: Lidocaine 1-2%   Indications: 1. Spondylosis without myelopathy or radiculopathy, cervical region   2. Cervical facet syndrome (Bilateral) (R>L)   3. Cervicalgia    Pain Score: Pre-procedure:  /10 Post-procedure:  /10  Pre-op Assessment:  Tanya [name] is a 57 y.o. (year old), female patient, seen today for interventional treatment. She  has a past surgical history that includes Abdominal hysterectomy; Colonoscopy; and Colonoscopy with propofol (N/A, 07/03/2017). Tanya [name] has a current medication list which includes the following prescription(s): albuterol, cyclobenzaprine, duloxetine, duloxetine, gabapentin, hydroxychloroquine, omeprazole, oxycodone, oxycodone, and oxycodone. Her primarily concern today is the No chief complaint on file.  Initial Vital Signs:  Pulse/HCG Rate:    Temp:   Resp:   BP:   SpO2:    BMI:  Estimated body mass index is 26.57 kg/m as calculated from the following:   Height as of 07/30/17: 5\' 3"  (1.6 m).   Weight as of 07/30/17: 150 lb (68 kg).  Risk Assessment: Allergies: Reviewed. She is allergic to trazodone; bupropion; methocarbamol; oxycodone-acetaminophen; tramadol; and trazodone and nefazodone.  Allergy Precautions: None required Coagulopathies: Reviewed. None identified.  Blood-thinner therapy: None at this time Active Infection(s): Reviewed. None identified. Tanya [name] is afebrile  Site Confirmation: Tanya [name] was asked to confirm the procedure and laterality before marking the site Procedure checklist: Completed Consent: Before the procedure and under the influence of no sedative(s), amnesic(s), or anxiolytics, the patient was informed of the treatment options, risks and possible complications. To fulfill our ethical and legal obligations, as recommended by the American Medical Association's Code of Ethics, I have informed the patient of my clinical impression; the nature and purpose of the treatment or procedure; the risks, benefits, and possible complications of the intervention; the alternatives, including doing nothing; the risk(s) and benefit(s) of the alternative treatment(s) or procedure(s); and the risk(s) and benefit(s) of doing nothing. The patient was provided information about the general risks and possible complications associated with the procedure. These may include, but are not limited to: failure to achieve desired goals, infection, bleeding, organ or nerve damage, allergic reactions, paralysis, and death. In addition, the patient was informed of those risks and complications associated to Spine-related procedures, such as failure to decrease pain; infection (i.e.: Meningitis, epidural or intraspinal abscess); bleeding (i.e.: epidural hematoma, subarachnoid hemorrhage, or any other type of intraspinal or peri-dural bleeding); organ or nerve damage (i.e.:  Any type of peripheral nerve, nerve root, or spinal cord injury) with subsequent damage to sensory, motor, and/or autonomic systems, resulting in permanent pain, numbness, and/or weakness  of one or several areas of the body; allergic reactions; (i.e.: anaphylactic reaction); and/or death. Furthermore, the patient was informed of those risks and complications associated with the medications. These include, but are not limited to: allergic reactions (i.e.: anaphylactic or anaphylactoid reaction(s)); adrenal axis suppression; blood sugar elevation that in diabetics may result in ketoacidosis or comma; water retention that in patients with history of congestive heart failure may result in shortness of breath, pulmonary edema, and decompensation with resultant heart failure; weight gain; swelling or edema; medication-induced neural toxicity; particulate matter embolism and blood vessel occlusion with resultant organ, and/or nervous system infarction; and/or aseptic necrosis of one or more joints. Finally, the patient was informed that Medicine is not an exact science; therefore, there is also the possibility of unforeseen or unpredictable risks and/or possible complications that may result in a catastrophic outcome. The patient indicated having understood very clearly. We have given the patient no guarantees and we have made no promises. Enough time was given to the patient to ask questions, all of which were answered to the patient's satisfaction. Ms. [name] has indicated that she wanted to continue with the procedure. Attestation: I, the ordering provider, attest that I have discussed with the patient the benefits, risks, side-effects, alternatives, likelihood of achieving goals, and potential problems during recovery for the procedure that I have provided informed consent. Date  Time: {CHL [facility]-PAIN TIME CHOICES:21018001}  Pre-Procedure Preparation:  Monitoring: As per clinic protocol. Respiration, ETCO2,  SpO2, BP, heart rate and rhythm monitor placed and checked for adequate function Safety Precautions: Patient was assessed for positional comfort and pressure points before starting the procedure. Time-out: I initiated and conducted the "Time-out" before starting the procedure, as per protocol. The patient was asked to participate by confirming the accuracy of the "Time Out" information. Verification of the correct person, site, and procedure were performed and confirmed by me, the nursing staff, and the patient. "Time-out" conducted as per Joint Commission's Universal Protocol (UP.01.01.01). Time:    Description of Procedure:          Position: Prone with head of the table raised to facilitate breathing. Laterality: Bilateral. The procedure was performed in identical fashion on both sides. Level: C3, C4, C5, C6, & C7 Medial Branch Level(s). Area Prepped: Posterior Cervico-thoracic Region Prepping solution: ChloraPrep (2% chlorhexidine gluconate and 70% isopropyl alcohol) Safety Precautions: Aspiration looking for blood return was conducted prior to all injections. At no point did we inject any substances, as a needle was being advanced. Before injecting, the patient was told to immediately notify me if she was experiencing any new onset of "ringing in the ears, or metallic taste in the mouth". No attempts were made at seeking any paresthesias. Safe injection practices and needle disposal techniques used. Medications properly checked for expiration dates. SDV (single dose vial) medications used. After the completion of the procedure, all disposable equipment used was discarded in the proper designated medical waste containers. Local Anesthesia: Protocol guidelines were followed. The patient was positioned over the fluoroscopy table. The area was prepped in the usual manner. The time-out was completed. The target area was identified using fluoroscopy. A 12-in long, straight, sterile hemostat was used with  fluoroscopic guidance to locate the targets for each level blocked. Once located, the skin was marked with an approved surgical skin marker. Once all sites were marked, the skin (epidermis, dermis, and hypodermis), as well as deeper tissues (fat, connective tissue and muscle) were infiltrated with a small amount of a short-acting local  anesthetic, loaded on a 10cc syringe with a 25G, 1.5-in  Needle. An appropriate amount of time was allowed for local anesthetics to take effect before proceeding to the next step. Local Anesthetic: Lidocaine 2.0% The unused portion of the local anesthetic was discarded in the proper designated containers. Technical explanation of process:  C3 Medial Branch Nerve Block (MBB): The target area for the C3 dorsal medial articular branch is the lateral concave waist of the articular pillar of C3. Under fluoroscopic guidance, a Quincke needle was inserted until contact was made with os over the postero-lateral aspect of the articular pillar of C3 (target area). After negative aspiration for blood, 0.5 mL of the nerve block solution was injected without difficulty or complication. The needle was removed intact. C4 Medial Branch Nerve Block (MBB): The target area for the C4 dorsal medial articular branch is the lateral concave waist of the articular pillar of C4. Under fluoroscopic guidance, a Quincke needle was inserted until contact was made with os over the postero-lateral aspect of the articular pillar of C4 (target area). After negative aspiration for blood, 0.5 mL of the nerve block solution was injected without difficulty or complication. The needle was removed intact. C5 Medial Branch Nerve Block (MBB): The target area for the C5 dorsal medial articular branch is the lateral concave waist of the articular pillar of C5. Under fluoroscopic guidance, a Quincke needle was inserted until contact was made with os over the postero-lateral aspect of the articular pillar of C5 (target  area). After negative aspiration for blood, 0.5 mL of the nerve block solution was injected without difficulty or complication. The needle was removed intact. C6 Medial Branch Nerve Block (MBB): The target area for the C6 dorsal medial articular branch is the lateral concave waist of the articular pillar of C6. Under fluoroscopic guidance, a Quincke needle was inserted until contact was made with os over the postero-lateral aspect of the articular pillar of C6 (target area). After negative aspiration for blood, 0.5 mL of the nerve block solution was injected without difficulty or complication. The needle was removed intact. C7 Medial Branch Nerve Block (MBB): The target for the C7 dorsal medial articular branch lies on the superior-medial tip of the C7 transverse process. Under fluoroscopic guidance, a Quincke needle was inserted until contact was made with os over the postero-lateral aspect of the articular pillar of C7 (target area). After negative aspiration for blood, 0.5 mL of the nerve block solution was injected without difficulty or complication. The needle was removed intact. Procedural Needles: 22-gauge, 3.5-inch, Quincke needles used for all levels. Nerve block solution: 0.2% PF-Ropivacaine + Triamcinolone (40 mg/mL) diluted to a final concentration of 4 mg of Triamcinolone/mL of Ropivacaine The unused portion of the solution was discarded in the proper designated containers.  Once the entire procedure was completed, the treated area was cleaned, making sure to leave some of the prepping solution back to take advantage of its long term bactericidal properties.  There were no vitals filed for this visit.  Start Time:   hrs. End Time:   hrs.  Imaging Guidance (Spinal):          Type of Imaging Technique: Fluoroscopy Guidance (Spinal) Indication(s): Assistance in needle guidance and placement for procedures requiring needle placement in or near specific anatomical locations not easily  accessible without such assistance. Exposure Time: Please see nurses notes. Contrast: None used. Fluoroscopic Guidance: I was personally present during the use of fluoroscopy. "Tunnel Vision Technique" used to obtain the  best possible view of the target area. Parallax error corrected before commencing the procedure. "Direction-depth-direction" technique used to introduce the needle under continuous pulsed fluoroscopy. Once target was reached, antero-posterior, oblique, and lateral fluoroscopic projection used confirm needle placement in all planes. Images permanently stored in EMR. Interpretation: No contrast injected. I personally interpreted the imaging intraoperatively. Adequate needle placement confirmed in multiple planes. Permanent images saved into the patient's record.  Antibiotic Prophylaxis:   Anti-infectives (From admission, onward)   None     Indication(s): None identified  Post-operative Assessment:  Post-procedure Vital Signs:  Pulse/HCG Rate:    Temp:   Resp:   BP:   SpO2:    EBL: None  Complications: No immediate post-treatment complications observed by team, or reported by patient.  Note: The patient tolerated the entire procedure well. A repeat set of vitals were taken after the procedure and the patient was kept under observation following institutional policy, for this type of procedure. Post-procedural neurological assessment was performed, showing return to baseline, prior to discharge. The patient was provided with post-procedure discharge instructions, including a section on how to identify potential problems. Should any problems arise concerning this procedure, the patient was given instructions to immediately contact us, at any time, without hesitation. In any case, we plan to contact the patient by telephone for a follow-up status report regarding this interventional procedure.  Comments:  No additional relevant information.  Plan of Care   Imaging Orders    No imaging studies ordered today   Procedure Orders    No procedure(s) ordered today    Medications ordered for procedure: No orders of the defined types were placed in this encounter.  Medications administered: Shaelee L. Grills had no medications administered during this visit.  See the medical record for exact dosing, route, and time of administration.  New Prescriptions   No medications on file   Disposition: Discharge home  Discharge Date & Time: 08/21/2017;   hrs.   Physician-requested Follow-up: No follow-ups on file.  Future Appointments  Date Time Provider [place]  08/21/2017  9:30 AM Milinda Pointer, MD [facility] None  10/30/2017 10:30 AM Vevelyn Francois, NP [facility] None   Primary Care Physician: Ricardo Jericho, NP Location: [facility] Note by: Gaspar Cola, MD Date: 08/21/2017; Time: 6:20 AM  Disclaimer:  Medicine is not an Chief Strategy Officer. The only guarantee in medicine is that nothing is guaranteed. It is important to note that the decision to proceed with this intervention was based on the information collected from the patient. The Data and conclusions were drawn from the patient's questionnaire, the interview, and the physical examination. Because the information was provided in large part by the patient, it cannot be guaranteed that it has not been purposely or unconsciously manipulated. Every effort has been made to obtain as much relevant data as possible for this evaluation. It is important to note that the conclusions that lead to this procedure are derived in large part from the available data. Always take into account that the treatment will also be dependent on availability of resources and existing treatment guidelines, considered by other Pain Management Practitioners as being common knowledge and practice, at the time of the intervention. For Medico-Legal purposes, it is also important to point out  that variation in procedural techniques and pharmacological choices are the acceptable norm. The indications, contraindications, technique, and results of the above procedure should only be interpreted and judged by a Board-Certified Interventional Pain Specialist  with extensive familiarity and expertise in the same exact procedure and technique.

## 2017-09-01 DIAGNOSIS — M899 Disorder of bone, unspecified: Secondary | ICD-10-CM | POA: Insufficient documentation

## 2017-09-01 DIAGNOSIS — Z79899 Other long term (current) drug therapy: Secondary | ICD-10-CM | POA: Insufficient documentation

## 2017-09-01 DIAGNOSIS — Z789 Other specified health status: Secondary | ICD-10-CM | POA: Insufficient documentation

## 2017-09-01 NOTE — Progress Notes (Signed)
Patient's Name: Tanya [name]  MRN: [id number]  Referring Provider: Ricardo Jericho*  DOB: [birth year]  PCP: Ricardo Jericho, NP  DOS: 09/03/2017  Note by: Gaspar Cola, MD  Service setting: Ambulatory outpatient  Specialty: Interventional Pain Management  Location: [facility] (AMB) Pain Management Facility    Patient type: Established   Primary Reason(s) for Visit: Evaluation of chronic illnesses with exacerbation, or progression (Level of risk: moderate) CC: Neck Pain (bilateral) and Back Pain (lower lumbar)  HPI  Tanya [name] is a 57 y.o. year old, female patient, who comes today for a follow-up evaluation. She has Anxiety; Chronic upper back pain (Secondary source of pain) (Bilateral) (R>L); Chronic discoid lupus erythematosus; Chronic low back pain [facility] source of pain) (Bilateral) (R>L); Chronic neck pain (Primary Source of Pain) (Bilateral) (R>L); Discoid lupus; Diverticulitis; Encounter for long-term (current) use of high-risk medication; Fibromyalgia; Generalized anxiety disorder; Hypercholesterolemia; Hyperlipidemia; Major depressive disorder, recurrent ([place]); Pain medication agreement signed; Osteoarthritis; SLE (systemic lupus erythematosus) ([place]); Urinary incontinence; Long term prescription benzodiazepine use; Long term current use of opiate analgesic; Long term prescription opiate use; Opiate use (10 MME/Day); Chronic pain syndrome; Chronic upper extremity pain (Right); Chronic cervical radicular pain (Right); Chronic shoulder pain (Bilateral) (R>L); 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 Tanya [name] was last seen on 08/05/2017. Her primarily concern today is the Neck Pain (bilateral) and Back Pain (lower lumbar)  Pain Assessment: Location: Left, Right Neck Radiating: into upper back into spine.  lower back pain is radiating into hips and legs  Onset: More than a month ago Duration: Chronic pain Quality: Discomfort, Stabbing, Burning, Constant Severity: 3 /10 (subjective, self-reported pain score)  Note: Reported level is compatible with observation.                         When using our objective Pain Scale, levels between 6 and 10/10 are said to belong in an emergency room, as it progressively worsens from a 6/10, described as severely limiting, requiring emergency care not usually available at an outpatient pain management facility. At a 6/10 level, communication becomes difficult and requires great effort. Assistance to reach the emergency department may be required. Facial flushing and profuse sweating along with potentially dangerous increases in heart rate and blood pressure will be evident. Effect on ADL: back pain interrupts sleep pattern.  affects gait  Timing: Constant Modifying factors: radio frequency, medications BP: 125/77  HR: 82  Further details on both, my assessment(s), as well as the proposed treatment plan, please see below.  Laboratory Chemistry  Inflammation Markers (CRP: Acute Phase) (ESR: Chronic Phase) Lab Results  Component Value Date   CRP 6.0 (H) 07/11/2016   ESRSEDRATE 7 07/11/2016                         Rheumatology Markers No results found.  Renal Function Markers Lab Results  Component Value Date   BUN 14 09/08/2016   CREATININE 0.85 09/08/2016   BCR 16 07/11/2016   GFRAA >60 09/08/2016   GFRNONAA >60 09/08/2016  Hepatic Function Markers Lab Results  Component Value Date   AST 21 09/08/2016   ALT 16 09/08/2016   ALBUMIN 4.4 09/08/2016   ALKPHOS 36 (L) 09/08/2016   LIPASE 37 09/08/2016                         Electrolytes Lab Results  Component Value Date   NA 137 09/08/2016   K 3.4 (L) 09/08/2016   CL 102 09/08/2016   CALCIUM 9.2 09/08/2016   MG 2.2 07/11/2016                        Neuropathy Markers Lab Results  Component Value Date   VITAMINB12 310 07/11/2016                        Bone Pathology Markers Lab Results  Component Value Date   25OHVITD1 9.6 (L) 07/11/2016   25OHVITD2 <1.0 07/11/2016   25OHVITD3 9.4 07/11/2016                         Coagulation Parameters Lab Results  Component Value Date   PLT 375 09/08/2016                        Cardiovascular Markers Lab Results  Component Value Date   HGB 13.4 09/08/2016   HCT 40.0 09/08/2016                         CA Markers No results found.  Note: Lab results reviewed.  Recent Diagnostic Imaging Review  Cervical Imaging: Cervical MR wo contrast:  Results for orders placed during the hospital encounter of 09/20/15  MR Cervical Spine Wo Contrast   Narrative CLINICAL DATA:  Long history of neck and back pain.  EXAM: MRI CERVICAL, THORACIC  WITHOUT CONTRAST  TECHNIQUE: Multiplanar and multiecho pulse sequences of the cervical spine, to include the craniocervical junction and cervicothoracic junction and thoracic spine were obtained without intravenous contrast.  COMPARISON:  None.  FINDINGS: MRI CERVICAL SPINE FINDINGS  Examination is limited due to patient motion and poor image quality.  Alignment: Normal.  Vertebrae: Grossly normal.  Cord: Grossly normal.  Posterior Fossa, vertebral arteries, paraspinal tissues: No significant findings.  Disc levels:  C2-3:  No significant findings.  C3-4: Shallow central disc protrusion with mild mass effect on the ventral thecal sac. Mild narrowing of the ventral CSF space. Mild right foraminal narrowing due to uncinate spurring.  C4-5: Bulging annulus, osteophytic ridging and uncinate spurring with mass effect on the  ventral thecal sac, asymmetric right. Narrowing of the ventral CSF space. Moderate bilateral foraminal stenosis.  C5-6: Shallow central disc protrusion, osteophytic ridging and uncinate spurring with flattening of the ventral thecal sac and narrowing of the ventral CSF space. Moderate left and mild right foraminal stenosis.  C6-7: Broad-based disc protrusion asymmetrical left with mass effect on the thecal sac and narrowing of the ventral CSF space. No significant foraminal stenosis.  C7-T1: No significant findings.  MRI THORACIC SPINE FINDINGS  Alignment:  Normal  Vertebrae: Normal  Cord:  Normal  Paraspinal and other soft tissues: Unremarkable  Disc levels:  No significant thoracic disc protrusions, spinal or foraminal stenosis. Shallow central disc protrusion noted at T7-8 with minimal impression on the ventral thecal sac.  IMPRESSION: 1. Multilevel disc disease in the cervical spine  with disc protrusions, bulging degenerated discs, osteophytic ridging and uncinate spurring with mild multilevel spinal and foraminal stenosis as discussed above at the individual levels. 2. Normal thoracic spine examination except for mild disc disease and facet disease and a shallow central disc protrusion at T7-8.   Electronically Signed   By: Marijo Sanes M.D.   On: 09/20/2015 11:04    Shoulder Imaging: Shoulder-R DG:  Results for orders placed during the hospital encounter of 11/23/15  DG Shoulder Right   Narrative CLINICAL DATA:  Gradual worsening of bilateral shoulder pain. History of lupus. No history of previous shoulder injury nor of surgery.  EXAM: LEFT SHOULDER - 2+ VIEW; RIGHT SHOULDER - 2+ VIEW  COMPARISON:  None in PACs  FINDINGS: Right shoulder: The bones are subjectively osteopenic. The glenohumeral and AC joint spaces are reasonably well maintained. The articular surfaces of the humeral head and adjacent glenoid appear normal. The subacromial subdeltoid  space is normal. The observed portions of the right clavicle and upper right ribs are normal.  Left shoulder: The bones are subjectively osteopenic similar to that on the right. The glenohumeral and AC joint spaces are preserved. The articular surfaces of the humeral head and acetabulum remains smoothly rounded. The subacromial subdeltoid space is normal. The observed portions of the left clavicle and upper left ribs are normal.  IMPRESSION: There is no acute or significant chronic bony abnormality of either shoulder.   Electronically Signed   By: David  Martinique M.D.   On: 11/23/2015 14:26    Shoulder-L DG:  Results for orders placed during the hospital encounter of 11/23/15  DG Shoulder Left   Narrative CLINICAL DATA:  Gradual worsening of bilateral shoulder pain. History of lupus. No history of previous shoulder injury nor of surgery.  EXAM: LEFT SHOULDER - 2+ VIEW; RIGHT SHOULDER - 2+ VIEW  COMPARISON:  None in PACs  FINDINGS: Right shoulder: The bones are subjectively osteopenic. The glenohumeral and AC joint spaces are reasonably well maintained. The articular surfaces of the humeral head and adjacent glenoid appear normal. The subacromial subdeltoid space is normal. The observed portions of the right clavicle and upper right ribs are normal.  Left shoulder: The bones are subjectively osteopenic similar to that on the right. The glenohumeral and AC joint spaces are preserved. The articular surfaces of the humeral head and acetabulum remains smoothly rounded. The subacromial subdeltoid space is normal. The observed portions of the left clavicle and upper left ribs are normal.  IMPRESSION: There is no acute or significant chronic bony abnormality of either shoulder.   Electronically Signed   By: David  Martinique M.D.   On: 11/23/2015 14:26     Thoracic Imaging: Thoracic MR wo contrast:  Results for orders placed during the hospital encounter of 09/20/15  MR  Thoracic Spine Wo Contrast   Narrative CLINICAL DATA:  Long history of neck and back pain.  EXAM: MRI CERVICAL, THORACIC  WITHOUT CONTRAST  TECHNIQUE: Multiplanar and multiecho pulse sequences of the cervical spine, to include the craniocervical junction and cervicothoracic junction and thoracic spine were obtained without intravenous contrast.  COMPARISON:  None.  FINDINGS: MRI CERVICAL SPINE FINDINGS  Examination is limited due to patient motion and poor image quality.  Alignment: Normal.  Vertebrae: Grossly normal.  Cord: Grossly normal.  Posterior Fossa, vertebral arteries, paraspinal tissues: No significant findings.  Disc levels:  C2-3:  No significant findings.  C3-4: Shallow central disc protrusion with mild mass effect on the ventral thecal sac. Mild narrowing  of the ventral CSF space. Mild right foraminal narrowing due to uncinate spurring.  C4-5: Bulging annulus, osteophytic ridging and uncinate spurring with mass effect on the ventral thecal sac, asymmetric right. Narrowing of the ventral CSF space. Moderate bilateral foraminal stenosis.  C5-6: Shallow central disc protrusion, osteophytic ridging and uncinate spurring with flattening of the ventral thecal sac and narrowing of the ventral CSF space. Moderate left and mild right foraminal stenosis.  C6-7: Broad-based disc protrusion asymmetrical left with mass effect on the thecal sac and narrowing of the ventral CSF space. No significant foraminal stenosis.  C7-T1: No significant findings.  MRI THORACIC SPINE FINDINGS  Alignment:  Normal  Vertebrae: Normal  Cord:  Normal  Paraspinal and other soft tissues: Unremarkable  Disc levels:  No significant thoracic disc protrusions, spinal or foraminal stenosis. Shallow central disc protrusion noted at T7-8 with minimal impression on the ventral thecal sac.  IMPRESSION: 1. Multilevel disc disease in the cervical spine with disc protrusions,  bulging degenerated discs, osteophytic ridging and uncinate spurring with mild multilevel spinal and foraminal stenosis as discussed above at the individual levels. 2. Normal thoracic spine examination except for mild disc disease and facet disease and a shallow central disc protrusion at T7-8.   Electronically Signed   By: Marijo Sanes M.D.   On: 09/20/2015 11:04    Complexity Note: Imaging results reviewed. Results shared with Ms. Lockner, using Layman's terms.                         Meds   Current Outpatient Medications:  .  albuterol (PROVENTIL HFA;VENTOLIN HFA) 108 (90 Base) MCG/ACT inhaler, Inhale 1-2 puffs into the lungs every 6 (six) hours as needed for wheezing or shortness of breath., Disp: 1 Inhaler, Rfl: 0 .  cyclobenzaprine (FLEXERIL) 10 MG tablet, Take 1 tablet (10 mg total) by mouth 3 (three) times daily as needed for muscle spasms., Disp: 90 tablet, Rfl: 0 .  DULoxetine (CYMBALTA) 30 MG capsule, Take 30 mg by mouth daily., Disp: , Rfl:  .  gabapentin (NEURONTIN) 300 MG capsule, Take 1 capsule (300 mg total) by mouth 3 (three) times daily., Disp: 90 capsule, Rfl: 0 .  hydroxychloroquine (PLAQUENIL) 200 MG tablet, Take by mouth., Disp: , Rfl:  .  omeprazole (PRILOSEC) 40 MG capsule, Take 40 mg by mouth daily., Disp: , Rfl:  .  [START ON 10/07/2017] oxyCODONE (OXY IR/ROXICODONE) 5 MG immediate release tablet, Take 1 tablet (5 mg total) by mouth 2 (two) times daily., Disp: 60 tablet, Rfl: 0 .  [START ON 09/07/2017] oxyCODONE (OXY IR/ROXICODONE) 5 MG immediate release tablet, Take 1 tablet (5 mg total) by mouth 2 (two) times daily., Disp: 60 tablet, Rfl: 0 .  oxyCODONE (OXY IR/ROXICODONE) 5 MG immediate release tablet, Take 1 tablet (5 mg total) by mouth 2 (two) times daily., Disp: 60 tablet, Rfl: 0 .  DULoxetine (CYMBALTA) 30 MG capsule, TAKE 1 CAPSULE(30 MG) BY MOUTH EVERY DAY, Disp: , Rfl:   ROS  Constitutional: Denies any fever or chills Gastrointestinal: No reported  hemesis, hematochezia, vomiting, or acute GI distress Musculoskeletal: Denies any acute onset joint swelling, redness, loss of ROM, or weakness Neurological: No reported episodes of acute onset apraxia, aphasia, dysarthria, agnosia, amnesia, paralysis, loss of coordination, or loss of consciousness  Allergies  Ms. Koller is allergic to trazodone; bupropion; methocarbamol; oxycodone-acetaminophen; tramadol; and trazodone and nefazodone.  [place]  Drug: Ms. Ailes  reports that she does not use drugs.  Alcohol:  reports that she does not drink alcohol. Tobacco:  reports that she has been smoking cigarettes. She has been smoking about 0.50 packs per day. She has never used smokeless tobacco. Medical:  has a past medical history of Anxiety, Bladder infection, Chronic lower back pain, Depression, Hyperlipidemia, Lupus ([place]), and Overactive bladder. Surgical: Ms. Rookstool  has a past surgical history that includes Abdominal hysterectomy; Colonoscopy; and Colonoscopy with propofol (N/A, 07/03/2017). Family: family history includes Cancer in her mother; Diabetes in her brother; Emphysema in her mother; Glaucoma in her father; Heart disease in her father; Hypertension in her mother; Stroke in her mother.  Constitutional Exam  General appearance: Well nourished, well developed, and well hydrated. In no apparent acute distress Vitals:   09/03/17 0842  BP: 125/77  Pulse: 82  Resp: 16  Temp: 97.8 F (36.6 C)  TempSrc: Oral  SpO2: 98%  Weight: 144 lb (65.3 kg)  Height: 5' 2"  (1.575 m)   BMI Assessment: Estimated body mass index is 26.34 kg/m as calculated from the following:   Height as of this encounter: 5' 2"  (1.575 m).   Weight as of this encounter: 144 lb (65.3 kg).  BMI interpretation table: BMI level Category Range association with higher incidence of chronic pain  <18 kg/m2 Underweight   18.5-24.9 kg/m2 Ideal body weight   25-29.9 kg/m2 Overweight Increased incidence by 20%   30-34.9 kg/m2 Obese (Class I) Increased incidence by 68%  35-39.9 kg/m2 Severe obesity (Class II) Increased incidence by 136%  >40 kg/m2 Extreme obesity (Class III) Increased incidence by 254%   Patient's current BMI Ideal Body weight  Body mass index is 26.34 kg/m. Ideal body weight: 50.1 kg (110 lb 7.2 oz) Adjusted ideal body weight: 56.2 kg (123 lb 13.9 oz)   BMI Readings from Last 4 Encounters:  09/03/17 26.34 kg/m  07/30/17 26.57 kg/m  07/03/17 26.22 kg/m  07/02/17 26.22 kg/m   Wt Readings from Last 4 Encounters:  09/03/17 144 lb (65.3 kg)  07/30/17 150 lb (68 kg)  07/03/17 148 lb (67.1 kg)  07/02/17 148 lb (67.1 kg)  Psych/Mental status: Alert, oriented x 3 (person, place, & time)       Eyes: PERLA Respiratory: No evidence of acute respiratory distress  Cervical Spine Area Exam  Skin & Axial Inspection: No masses, redness, edema, swelling, or associated skin lesions Alignment: Symmetrical Functional ROM: Guarding      Stability: No instability detected Muscle Tone/Strength: Functionally intact. No obvious neuro-muscular anomalies detected. Sensory (Neurological): Movement-associated pain Palpation: Complains of area being tender to palpation              Upper Extremity (UE) Exam    Side: Right upper extremity  Side: Left upper extremity  Skin & Extremity Inspection: Skin color, temperature, and hair growth are WNL. No peripheral edema or cyanosis. No masses, redness, swelling, asymmetry, or associated skin lesions. No contractures.  Skin & Extremity Inspection: Skin color, temperature, and hair growth are WNL. No peripheral edema or cyanosis. No masses, redness, swelling, asymmetry, or associated skin lesions. No contractures.  Functional ROM: Unrestricted ROM          Functional ROM: Unrestricted ROM          Muscle Tone/Strength: Functionally intact. No obvious neuro-muscular anomalies detected.  Muscle Tone/Strength: Functionally intact. No obvious neuro-muscular  anomalies detected.  Sensory (Neurological): Unimpaired          Sensory (Neurological): Unimpaired          Palpation:  No palpable anomalies              Palpation: No palpable anomalies              Provocative Test(s):  Phalen's test: deferred Tinel's test: deferred Apley's scratch test (touch opposite shoulder):  Action 1 (Across chest): deferred Action 2 (Overhead): deferred Action 3 (LB reach): deferred   Provocative Test(s):  Phalen's test: deferred Tinel's test: deferred Apley's scratch test (touch opposite shoulder):  Action 1 (Across chest): deferred Action 2 (Overhead): deferred Action 3 (LB reach): deferred    Thoracic Spine Area Exam  Skin & Axial Inspection: No masses, redness, or swelling Alignment: Symmetrical Functional ROM: Unrestricted ROM Stability: No instability detected Muscle Tone/Strength: Functionally intact. No obvious neuro-muscular anomalies detected. Sensory (Neurological): Unimpaired Muscle strength & Tone: No palpable anomalies  Lumbar Spine Area Exam  Skin & Axial Inspection: No masses, redness, or swelling Alignment: Symmetrical Functional ROM: Unrestricted ROM       Stability: No instability detected Muscle Tone/Strength: Functionally intact. No obvious neuro-muscular anomalies detected. Sensory (Neurological): Unimpaired Palpation: No palpable anomalies       Provocative Tests: Hyperextension/rotation test: deferred today       Lumbar quadrant test (Kemp's test): deferred today       Lateral bending test: deferred today       Patrick's Maneuver: deferred today                   FABER test: deferred today                   S-I anterior distraction/compression test: deferred today         S-I lateral compression test: deferred today         S-I Thigh-thrust test: deferred today         S-I Gaenslen's test: deferred today          Gait & Posture Assessment  Ambulation: Unassisted Gait: Relatively normal for age and body  habitus Posture: WNL   Lower Extremity Exam    Side: Right lower extremity  Side: Left lower extremity  Stability: No instability observed          Stability: No instability observed          Skin & Extremity Inspection: Skin color, temperature, and hair growth are WNL. No peripheral edema or cyanosis. No masses, redness, swelling, asymmetry, or associated skin lesions. No contractures.  Skin & Extremity Inspection: Skin color, temperature, and hair growth are WNL. No peripheral edema or cyanosis. No masses, redness, swelling, asymmetry, or associated skin lesions. No contractures.  Functional ROM: Unrestricted ROM                  Functional ROM: Unrestricted ROM                  Muscle Tone/Strength: Functionally intact. No obvious neuro-muscular anomalies detected.  Muscle Tone/Strength: Functionally intact. No obvious neuro-muscular anomalies detected.  Sensory (Neurological): Unimpaired  Sensory (Neurological): Unimpaired  Palpation: No palpable anomalies  Palpation: No palpable anomalies   Assessment  Primary Diagnosis & Pertinent Problem List: The primary encounter diagnosis was Chronic neck pain (Primary Source of Pain) (Bilateral) (R>L). Diagnoses of Cervical facet syndrome (Bilateral) (R>L), Spondylosis without myelopathy or radiculopathy, cervical region, Chronic upper back pain (Secondary source of pain) (Bilateral) (R>L), Chronic low back pain (Tertiary source of pain) (Bilateral) (R>L), Chronic pain syndrome, Pharmacologic therapy, Disorder of skeletal system, and Problems influencing health status  were also pertinent to this visit.  Status Diagnosis  Recurring Recurring Persistent 1. Chronic neck pain (Primary Source of Pain) (Bilateral) (R>L)   2. Cervical facet syndrome (Bilateral) (R>L)   3. Spondylosis without myelopathy or radiculopathy, cervical region   4. Chronic upper back pain (Secondary source of pain) (Bilateral) (R>L)   5. Chronic low back pain [facility] source of  pain) (Bilateral) (R>L)   6. Chronic pain syndrome   7. Pharmacologic therapy   8. Disorder of skeletal system   9. Problems influencing health status     Problems updated and reviewed during this visit: No problems updated. Plan of Care  Pharmacotherapy (Medications Ordered): No orders of the defined types were placed in this encounter.  Medications administered today: Seda L. Macconnell had no medications administered during this visit.   Procedure Orders     Radiofrequency,Cervical  Lab Orders     Comp. Metabolic Panel (12)     Magnesium     Vitamin B12     Sedimentation rate     25-Hydroxyvitamin D Lcms D2+D3     C-reactive protein     ToxASSURE Select 13 (MW), Urine Imaging Orders  No imaging studies ordered today   Referral Orders  No referral(s) requested today   Interventional management options: Planned, scheduled, and/or pending:   Palliative right-sided cervical facet RFA#2     Considering:   Diagnostic bilateral intra-articular shoulder joint injection Diagnostic bilateral suprascapular nerve block Possible bilateral suprascapular nerve RFA Right-sided cervical epidural steroid injection(Series #2)  Diagnostic bilateral cervical facet block Possible bilateral cervical facet RFA  Diagnostic bilateral lumbar facet block Possible bilateral lumbar facet RFA     Palliative PRN treatment(s):   Palliative right-sided cervical ESI(Series #2)  Palliative right-sided cervical facet RFA#2     Provider-requested follow-up: Return for RFA (fluoro + sedation): (R) C-FCT RFA #2.  Future Appointments  Date Time Provider [place]  10/23/2017  2:15 PM Milinda Pointer, MD [facility] None  10/30/2017 10:30 AM Vevelyn Francois, NP [facility] None   Primary Care Physician: Ricardo Jericho, NP Location: [facility] Facility Note by: Gaspar Cola, MD Date: 09/03/2017; Time: 2:02 PM

## 2017-09-03 ENCOUNTER — Encounter: Payer: Self-pay | Admitting: Pain Medicine

## 2017-09-03 ENCOUNTER — Ambulatory Visit: Payer: Medicaid Other | Attending: Pain Medicine | Admitting: Pain Medicine

## 2017-09-03 ENCOUNTER — Encounter (INDEPENDENT_AMBULATORY_CARE_PROVIDER_SITE_OTHER): Payer: Self-pay

## 2017-09-03 VITALS — BP 125/77 | HR 82 | Temp 97.8°F | Resp 16 | Ht 62.0 in | Wt 144.0 lb

## 2017-09-03 DIAGNOSIS — G8929 Other chronic pain: Secondary | ICD-10-CM

## 2017-09-03 DIAGNOSIS — F329 Major depressive disorder, single episode, unspecified: Secondary | ICD-10-CM | POA: Diagnosis not present

## 2017-09-03 DIAGNOSIS — Z79899 Other long term (current) drug therapy: Secondary | ICD-10-CM | POA: Insufficient documentation

## 2017-09-03 DIAGNOSIS — M546 Pain in thoracic spine: Secondary | ICD-10-CM | POA: Insufficient documentation

## 2017-09-03 DIAGNOSIS — M19012 Primary osteoarthritis, left shoulder: Secondary | ICD-10-CM | POA: Diagnosis not present

## 2017-09-03 DIAGNOSIS — M47812 Spondylosis without myelopathy or radiculopathy, cervical region: Secondary | ICD-10-CM | POA: Insufficient documentation

## 2017-09-03 DIAGNOSIS — M542 Cervicalgia: Secondary | ICD-10-CM | POA: Diagnosis present

## 2017-09-03 DIAGNOSIS — E7800 Pure hypercholesterolemia, unspecified: Secondary | ICD-10-CM | POA: Diagnosis not present

## 2017-09-03 DIAGNOSIS — Z79891 Long term (current) use of opiate analgesic: Secondary | ICD-10-CM | POA: Insufficient documentation

## 2017-09-03 DIAGNOSIS — N3281 Overactive bladder: Secondary | ICD-10-CM | POA: Diagnosis not present

## 2017-09-03 DIAGNOSIS — M899 Disorder of bone, unspecified: Secondary | ICD-10-CM

## 2017-09-03 DIAGNOSIS — M329 Systemic lupus erythematosus, unspecified: Secondary | ICD-10-CM | POA: Insufficient documentation

## 2017-09-03 DIAGNOSIS — M549 Dorsalgia, unspecified: Secondary | ICD-10-CM | POA: Diagnosis not present

## 2017-09-03 DIAGNOSIS — F411 Generalized anxiety disorder: Secondary | ICD-10-CM | POA: Diagnosis not present

## 2017-09-03 DIAGNOSIS — M19011 Primary osteoarthritis, right shoulder: Secondary | ICD-10-CM | POA: Diagnosis not present

## 2017-09-03 DIAGNOSIS — M797 Fibromyalgia: Secondary | ICD-10-CM | POA: Diagnosis not present

## 2017-09-03 DIAGNOSIS — M545 Low back pain: Secondary | ICD-10-CM

## 2017-09-03 DIAGNOSIS — F17210 Nicotine dependence, cigarettes, uncomplicated: Secondary | ICD-10-CM | POA: Diagnosis not present

## 2017-09-03 DIAGNOSIS — E559 Vitamin D deficiency, unspecified: Secondary | ICD-10-CM | POA: Diagnosis not present

## 2017-09-03 DIAGNOSIS — M5021 Other cervical disc displacement,  high cervical region: Secondary | ICD-10-CM | POA: Diagnosis not present

## 2017-09-03 DIAGNOSIS — M5124 Other intervertebral disc displacement, thoracic region: Secondary | ICD-10-CM | POA: Diagnosis not present

## 2017-09-03 DIAGNOSIS — Z789 Other specified health status: Secondary | ICD-10-CM

## 2017-09-03 DIAGNOSIS — G894 Chronic pain syndrome: Secondary | ICD-10-CM | POA: Diagnosis not present

## 2017-09-03 NOTE — Patient Instructions (Addendum)
____________________________________________________________________________________________  Preparing for Procedure with Sedation  Instructions: . Oral Intake: Do not eat or drink anything for at least 8 hours prior to your procedure. . Transportation: Public transportation is not allowed. Bring an adult driver. The driver must be physically present in our waiting room before any procedure can be started. . Physical Assistance: Bring an adult physically capable of assisting you, in the event you need help. This adult should keep you company at home for at least 6 hours after the procedure. . Blood Pressure Medicine: Take your blood pressure medicine with a sip of water the morning of the procedure. . Blood thinners: Notify our staff if you are taking any blood thinners. Depending on which one you take, there will be specific instructions on how and when to stop it. . Diabetics on insulin: Notify the staff so that you can be scheduled 1st case in the morning. If your diabetes requires high dose insulin, take only  of your normal insulin dose the morning of the procedure and notify the staff that you have done so. . Preventing infections: Shower with an antibacterial soap the morning of your procedure. . Build-up your immune system: Take 1000 mg of Vitamin C with every meal (3 times a day) the day prior to your procedure. . Antibiotics: Inform the staff if you have a condition or reason that requires you to take antibiotics before dental procedures. . Pregnancy: If you are pregnant, call and cancel the procedure. . Sickness: If you have a cold, fever, or any active infections, call and cancel the procedure. . Arrival: You must be in the facility at least 30 minutes prior to your scheduled procedure. . Children: Do not bring children with you. . Dress appropriately: Bring dark clothing that you would not mind if they get stained. . Valuables: Do not bring any jewelry or valuables.  Procedure  appointments are reserved for interventional treatments only. . No Prescription Refills. . No medication changes will be discussed during procedure appointments. . No disability issues will be discussed.  Reasons to call and reschedule or cancel your procedure: (Following these recommendations will minimize the risk of a serious complication.) . Surgeries: Avoid having procedures within 2 weeks of any surgery. (Avoid for 2 weeks before or after any surgery). . Flu Shots: Avoid having procedures within 2 weeks of a flu shots or . (Avoid for 2 weeks before or after immunizations). . Barium: Avoid having a procedure within 7-10 days after having had a radiological study involving the use of radiological contrast. (Myelograms, Barium swallow or enema study). . Heart attacks: Avoid any elective procedures or surgeries for the initial 6 months after a "Myocardial Infarction" (Heart Attack). . Blood thinners: It is imperative that you stop these medications before procedures. Let us know if you if you take any blood thinner.  . Infection: Avoid procedures during or within two weeks of an infection (including chest colds or gastrointestinal problems). Symptoms associated with infections include: Localized redness, fever, chills, night sweats or profuse sweating, burning sensation when voiding, cough, congestion, stuffiness, runny nose, sore throat, diarrhea, nausea, vomiting, cold or Flu symptoms, recent or current infections. It is specially important if the infection is over the area that we intend to treat. . Heart and lung problems: Symptoms that may suggest an active cardiopulmonary problem include: cough, chest pain, breathing difficulties or shortness of breath, dizziness, ankle swelling, uncontrolled high or unusually low blood pressure, and/or palpitations. If you are experiencing any of these symptoms, cancel   your procedure and contact your primary care physician for an evaluation.  Remember:   Regular Business hours are:  Monday to Thursday 8:00 AM to 4:00 PM  Provider's Schedule: Milinda Pointer, MD:  Procedure days: Tuesday and Thursday 7:30 AM to 4:00 PM  Gillis Santa, MD:  Procedure days: Monday and Wednesday 7:30 AM to 4:00 PM ____________________________________________________________________________________________   Facet Blocks Patient Information  Description: The facets are joints in the spine between the vertebrae.  Like any joints in the body, facets can become irritated and painful.  Arthritis can also effect the facets.  By injecting steroids and local anesthetic in and around these joints, we can temporarily block the nerve supply to them.  Steroids act directly on irritated nerves and tissues to reduce selling and inflammation which often leads to decreased pain.  Facet blocks may be done anywhere along the spine from the neck to the low back depending upon the location of your pain.   After numbing the skin with local anesthetic (like Novocaine), a small needle is passed onto the facet joints under x-ray guidance.  You may experience a sensation of pressure while this is being done.  The entire block usually lasts about 15-25 minutes.   Conditions which may be treated by facet blocks:   Low back/buttock pain  Neck/shoulder pain  Certain types of headaches  Preparation for the injection:  1. Do not eat any solid food or dairy products within 8 hours of your appointment. 2. You may drink clear liquid up to 3 hours before appointment.  Clear liquids include water, black coffee, juice or soda.  No milk or cream please. 3. You may take your regular medication, including pain medications, with a sip of water before your appointment.  Diabetics should hold regular insulin (if taken separately) and take 1/2 normal NPH dose the morning of the procedure.  Carry some sugar containing items with you to your appointment. 4. A driver must accompany you and be  prepared to drive you home after your procedure. 5. Bring all your current medications with you. 6. An IV may be inserted and sedation may be given at the discretion of the physician. 7. A blood pressure cuff, EKG and other monitors will often be applied during the procedure.  Some patients may need to have extra oxygen administered for a short period. 8. You will be asked to provide medical information, including your allergies and medications, prior to the procedure.  We must know immediately if you are taking blood thinners (like Coumadin/Warfarin) or if you are allergic to IV iodine contrast (dye).  We must know if you could possible be pregnant.  Possible side-effects:   Bleeding from needle site  Infection (rare, may require surgery)  Nerve injury (rare)  Numbness & tingling (temporary)  Difficulty urinating (rare, temporary)  Spinal headache (a headache worse with upright posture)  Light-headedness (temporary)  Pain at injection site (serveral days)  Decreased blood pressure (rare, temporary)  Weakness in arm/leg (temporary)  Pressure sensation in back/neck (temporary)   Call if you experience:   Fever/chills associated with headache or increased back/neck pain  Headache worsened by an upright position  New onset, weakness or numbness of an extremity below the injection site  Hives or difficulty breathing (go to the emergency room)  Inflammation or drainage at the injection site(s)  Severe back/neck pain greater than usual  New symptoms which are concerning to you  Please note:  Although the local anesthetic injected can often make  your back or neck feel good for several hours after the injection, the pain will likely return. It takes 3-7 days for steroids to work.  You may not notice any pain relief for at least one week.  If effective, we will often do a series of 2-3 injections spaced 3-6 weeks apart to maximally decrease your pain.  After the initial  series, you may be a candidate for a more permanent nerve block of the facets.  If you have any questions, please call [phone] [facility] Clinic[facility] Radiofrequency lesioning is a procedure that is performed to relieve pain. The procedure is often used for back, neck, or arm pain. Radiofrequency lesioning involves the use of a machine that creates radio waves to make heat. During the procedure, the heat is applied to the nerve that carries the pain signal. The heat damages the nerve and interferes with the pain signal. Pain relief usually starts about 2 weeks after the procedure and lasts for 6 months to 1 year. Tell a health care provider about:  Any allergies you have.  All medicines you are taking, including vitamins, herbs, eye drops, creams, and over-the-counter medicines.  Any problems you or family members have had with anesthetic medicines.  Any blood disorders you have.  Any surgeries you have had.  Any medical conditions you have.  Whether you are pregnant or may be pregnant. What are the risks? Generally, this is a safe procedure. However, problems may occur, including:  Pain or soreness at the injection site.  Infection at the injection site.  Damage to nerves or blood vessels.  What happens before the procedure?  Ask your health care provider about: ? Changing or stopping your regular medicines. This is especially important if you are taking diabetes medicines or blood thinners. ? Taking medicines such as aspirin and ibuprofen. These medicines can thin your blood. Do not take these medicines before your procedure if your health care provider instructs you not to.  Follow instructions from your health care provider about eating or drinking restrictions.  Plan to have someone take you home after the procedure.  If you go home right after the procedure, plan to have someone with you for 24 hours. What happens  during the procedure?  You will be given one or more of the following: ? A medicine to help you relax (sedative). ? A medicine to numb the area (local anesthetic).  You will be awake during the procedure. You will need to be able to talk with the health care provider during the procedure.  With the help of a type of X-ray (fluoroscopy), the health care provider will insert a radiofrequency needle into the area to be treated.  Next, a wire that carries the radio waves (electrode) will be put through the radiofrequency needle. An electrical pulse will be sent through the electrode to verify the correct nerve. You will feel a tingling sensation, and you may have muscle twitching.  Then, the tissue that is around the needle tip will be heated by an electric current that is passed using the radiofrequency machine. This will numb the nerves.  A bandage (dressing) will be put on the insertion area after the procedure is done. The procedure may vary among health care providers and hospitals. What happens after the procedure?  Your blood pressure, heart rate, breathing rate, and blood oxygen level will be monitored often until the medicines you were given have worn off.  Return to your normal   activities as directed by your health care provider. This information is not intended to replace advice given to you by your health care provider. Make sure you discuss any questions you have with your health care provider. Document Released: 09/05/2010 Document Revised: 06/15/2015 Document Reviewed: 02/14/2014 Elsevier Interactive Patient Education  2018 [employer]  

## 2017-09-09 LAB — TOXASSURE SELECT 13 (MW), URINE

## 2017-09-11 LAB — COMP. METABOLIC PANEL (12)
A/G RATIO: 2.2 (ref 1.2–2.2)
AST: 15 IU/L (ref 0–40)
Albumin: 4.4 g/dL (ref 3.5–5.5)
Alkaline Phosphatase: 30 IU/L — ABNORMAL LOW (ref 39–117)
BUN / CREAT RATIO: 15 (ref 9–23)
BUN: 10 mg/dL (ref 6–24)
Bilirubin Total: 0.2 mg/dL (ref 0.0–1.2)
CALCIUM: 9.4 mg/dL (ref 8.7–10.2)
CHLORIDE: 101 mmol/L (ref 96–106)
Creatinine, Ser: 0.67 mg/dL (ref 0.57–1.00)
GFR calc Af Amer: 114 mL/min/{1.73_m2} (ref >59)
GFR calc non Af Amer: 99 mL/min/{1.73_m2} (ref >59)
GLOBULIN, TOTAL: 2 g/dL (ref 1.5–4.5)
Glucose: 137 mg/dL — ABNORMAL HIGH (ref 65–99)
POTASSIUM: 4.2 mmol/L (ref 3.5–5.2)
SODIUM: 137 mmol/L (ref 134–144)
TOTAL PROTEIN: 6.4 g/dL (ref 6.0–8.5)

## 2017-09-11 LAB — 25-HYDROXY VITAMIN D LCMS D2+D3
25-Hydroxy, Vitamin D-2: 2.9 ng/mL
25-Hydroxy, Vitamin D-3: 22 ng/mL
25-Hydroxy, Vitamin D: 25 ng/mL — ABNORMAL LOW

## 2017-09-11 LAB — MAGNESIUM: Magnesium: 1.9 mg/dL (ref 1.6–2.3)

## 2017-09-11 LAB — C-REACTIVE PROTEIN: CRP: 2 mg/L (ref 0–10)

## 2017-09-11 LAB — VITAMIN B12: Vitamin B-12: 296 pg/mL (ref 232–1245)

## 2017-09-11 LAB — SEDIMENTATION RATE: Sed Rate: 21 mm/hr (ref 0–40)

## 2017-10-18 ENCOUNTER — Other Ambulatory Visit: Payer: Self-pay | Admitting: Pain Medicine

## 2017-10-18 DIAGNOSIS — M7918 Myalgia, other site: Secondary | ICD-10-CM

## 2017-10-20 ENCOUNTER — Encounter: Payer: Self-pay | Admitting: *Deleted

## 2017-10-21 ENCOUNTER — Encounter: Payer: Self-pay | Admitting: *Deleted

## 2017-10-21 ENCOUNTER — Encounter
Admission: RE | Disposition: A | Discharge status: Home / Self Care | Payer: Self-pay | Source: Ambulatory Visit | Attending: Gastroenterology

## 2017-10-21 ENCOUNTER — Ambulatory Visit: Payer: Medicaid Other | Admitting: Anesthesiology

## 2017-10-21 ENCOUNTER — Ambulatory Visit
Admission: RE | Admit: 2017-10-21 | Discharge: 2017-10-21 | Disposition: A | Discharge status: Home / Self Care | Payer: Medicaid Other | Source: Ambulatory Visit | Attending: Gastroenterology | Admitting: Gastroenterology

## 2017-10-21 DIAGNOSIS — M329 Systemic lupus erythematosus, unspecified: Secondary | ICD-10-CM | POA: Diagnosis not present

## 2017-10-21 DIAGNOSIS — M545 Low back pain: Secondary | ICD-10-CM | POA: Diagnosis not present

## 2017-10-21 DIAGNOSIS — K2970 Gastritis, unspecified, without bleeding: Secondary | ICD-10-CM | POA: Insufficient documentation

## 2017-10-21 DIAGNOSIS — M1990 Unspecified osteoarthritis, unspecified site: Secondary | ICD-10-CM | POA: Insufficient documentation

## 2017-10-21 DIAGNOSIS — E785 Hyperlipidemia, unspecified: Secondary | ICD-10-CM | POA: Insufficient documentation

## 2017-10-21 DIAGNOSIS — R1310 Dysphagia, unspecified: Secondary | ICD-10-CM | POA: Diagnosis not present

## 2017-10-21 DIAGNOSIS — F17210 Nicotine dependence, cigarettes, uncomplicated: Secondary | ICD-10-CM | POA: Insufficient documentation

## 2017-10-21 DIAGNOSIS — K228 Other specified diseases of esophagus: Secondary | ICD-10-CM | POA: Diagnosis not present

## 2017-10-21 DIAGNOSIS — D127 Benign neoplasm of rectosigmoid junction: Secondary | ICD-10-CM | POA: Diagnosis not present

## 2017-10-21 DIAGNOSIS — G8929 Other chronic pain: Secondary | ICD-10-CM | POA: Insufficient documentation

## 2017-10-21 DIAGNOSIS — K5730 Diverticulosis of large intestine without perforation or abscess without bleeding: Secondary | ICD-10-CM | POA: Insufficient documentation

## 2017-10-21 DIAGNOSIS — F419 Anxiety disorder, unspecified: Secondary | ICD-10-CM | POA: Insufficient documentation

## 2017-10-21 DIAGNOSIS — K625 Hemorrhage of anus and rectum: Secondary | ICD-10-CM | POA: Insufficient documentation

## 2017-10-21 DIAGNOSIS — K3189 Other diseases of stomach and duodenum: Secondary | ICD-10-CM | POA: Insufficient documentation

## 2017-10-21 DIAGNOSIS — D123 Benign neoplasm of transverse colon: Secondary | ICD-10-CM | POA: Insufficient documentation

## 2017-10-21 DIAGNOSIS — M797 Fibromyalgia: Secondary | ICD-10-CM | POA: Insufficient documentation

## 2017-10-21 DIAGNOSIS — F329 Major depressive disorder, single episode, unspecified: Secondary | ICD-10-CM | POA: Diagnosis not present

## 2017-10-21 DIAGNOSIS — R1084 Generalized abdominal pain: Secondary | ICD-10-CM | POA: Diagnosis present

## 2017-10-21 DIAGNOSIS — M542 Cervicalgia: Secondary | ICD-10-CM | POA: Diagnosis not present

## 2017-10-21 HISTORY — PX: ESOPHAGOGASTRODUODENOSCOPY: SHX5428

## 2017-10-21 HISTORY — DX: Other chronic pain: G89.29

## 2017-10-21 HISTORY — PX: COLONOSCOPY WITH PROPOFOL: SHX5780

## 2017-10-21 HISTORY — DX: Cervicalgia: M54.2

## 2017-10-21 HISTORY — DX: Diverticulitis of intestine, part unspecified, without perforation or abscess without bleeding: K57.92

## 2017-10-21 SURGERY — EGD (ESOPHAGOGASTRODUODENOSCOPY)
Anesthesia: General

## 2017-10-21 MED ORDER — SODIUM CHLORIDE 0.9 % IV SOLN
INTRAVENOUS | Status: DC
  Administered 2017-10-21: 1000 mL via INTRAVENOUS

## 2017-10-21 MED ORDER — PROPOFOL 500 MG/50ML IV EMUL
INTRAVENOUS | Status: AC
  Filled 2017-10-21: qty 50

## 2017-10-21 MED ORDER — PROPOFOL 500 MG/50ML IV EMUL: INTRAVENOUS | Status: DC | PRN

## 2017-10-21 MED ORDER — SODIUM CHLORIDE 0.9 % IV SOLN: INTRAVENOUS | Status: DC

## 2017-10-21 MED ORDER — LACTATED RINGERS IV SOLN
INTRAVENOUS | Status: DC | PRN
  Administered 2017-10-21: 12:00:00 via INTRAVENOUS

## 2017-10-21 MED ORDER — LACTATED RINGERS IV SOLN
INTRAVENOUS | Status: DC | PRN
  Administered 2017-10-21: 11:00:00 via INTRAVENOUS

## 2017-10-21 MED ORDER — PROPOFOL 10 MG/ML IV BOLUS
INTRAVENOUS | Status: DC | PRN
  Administered 2017-10-21: 520 mg via INTRAVENOUS

## 2017-10-21 MED ORDER — GLYCOPYRROLATE 0.2 MG/ML IJ SOLN
INTRAMUSCULAR | Status: DC | PRN
  Administered 2017-10-21: 0.2 mg via INTRAVENOUS

## 2017-10-21 MED ORDER — LACTATED RINGERS IV SOLN: INTRAVENOUS | Status: DC | PRN

## 2017-10-21 NOTE — H&P (Signed)
Outpatient short stay form Pre-procedure 10/21/2017 11:05 AM Tanya Sails MD  Primary Physician: Eulogio Bear NP  Reason for visit: EGD and colonoscopy  History of present illness: Patient is a 57 year old female presenting today as above.  The office several months ago she had a complaint of dysphagia was placed on a PPI and currently states that that has resolved.  She did have a barium swallow which was normal.  She also has a history however of constipation and generalized abdominal pain and some rectal bleeding.  A colonoscopy was attempted however patient had respiratory distress during this and the procedure had to be halted.  At that time she had had a bronchitis bronchitis had to be treated she is returning for her procedures today.  She states she feels much better today    Current Facility-Administered Medications:  .  0.9 %  sodium chloride infusion, , Intravenous, Continuous, Tanya Sails, MD .  0.9 %  sodium chloride infusion, , Intravenous, Continuous, Tanya Sails, MD, Last Rate: 20 mL/hr at 10/21/17 1045, 1,000 mL at 10/21/17 1045  Medications Prior to Admission  Medication Sig Dispense Refill Last Dose  . cyclobenzaprine (FLEXERIL) 10 MG tablet Take 1 tablet (10 mg total) by mouth 3 (three) times daily as needed for muscle spasms. 90 tablet 0 10/20/2017 at Unknown time  . DULoxetine (CYMBALTA) 30 MG capsule Take 30 mg by mouth daily.   10/20/2017 at Unknown time  . hydroxychloroquine (PLAQUENIL) 200 MG tablet Take by mouth.   10/20/2017 at Unknown time  . omeprazole (PRILOSEC) 40 MG capsule Take 40 mg by mouth daily.   10/20/2017 at Unknown time  . oxyCODONE (OXY IR/ROXICODONE) 5 MG immediate release tablet Take 1 tablet (5 mg total) by mouth 2 (two) times daily. 60 tablet 0 Past Week at Unknown time  . albuterol (PROVENTIL HFA;VENTOLIN HFA) 108 (90 Base) MCG/ACT inhaler Inhale 1-2 puffs into the lungs every 6 (six) hours as needed for wheezing or shortness of  breath. 1 Inhaler 0  at prn  . DULoxetine (CYMBALTA) 30 MG capsule TAKE 1 CAPSULE(30 MG) BY MOUTH EVERY DAY   Taking  . gabapentin (NEURONTIN) 300 MG capsule Take 1 capsule (300 mg total) by mouth 3 (three) times daily. 90 capsule 0 Taking  . nicotine (NICODERM CQ - DOSED IN MG/24 HOURS) 21 mg/24hr patch Place 21 mg onto the skin daily.   Not Taking at Unknown time  . oxyCODONE (OXY IR/ROXICODONE) 5 MG immediate release tablet Take 1 tablet (5 mg total) by mouth 2 (two) times daily. 60 tablet 0 Taking  . oxyCODONE (OXY IR/ROXICODONE) 5 MG immediate release tablet Take 1 tablet (5 mg total) by mouth 2 (two) times daily. 60 tablet 0 Taking  . pantoprazole (PROTONIX) 40 MG tablet Take 40 mg by mouth daily.   Not Taking at Unknown time     Allergies  Allergen Reactions  . Trazodone Other (See Comments)    Agitation  . Bupropion Anxiety  . Methocarbamol Other (See Comments)    Agitation and stiffness  . Oxycodone-Acetaminophen     Other reaction(s): Nausea And Vomiting  . Tramadol Other (See Comments)    Other reaction(s): Nausea And Vomiting Reports she feels "high"  . Trazodone And Nefazodone Other (See Comments)    Agitation      Past Medical History:  Diagnosis Date  . Anxiety   . Bladder infection    8/18  . Chronic lower back pain   . Chronic neck  pain   . Depression   . Diverticulitis   . Hyperlipidemia   . Lupus ([place])   . Overactive bladder     Review of systems:      Physical Exam    Heart and lungs: Regular rate and rhythm without rub or gallop, lungs are bilaterally clear.    HEENT: Normocephalic atraumatic eyes are anicteric    Other:    Pertinant exam for procedure: Soft  nondistended, mild discomfort palpation left lower quadrant.  There are no masses or rebound noted.  All sounds are positive normoactive    Planned proceedures: EGD, colonoscopy and indicated procedures. I have discussed the risks benefits and complications of procedures to include  not limited to bleeding, infection, perforation and the risk of sedation and the patient wishes to proceed.    Tanya Sails, MD Gastroenterology 10/21/2017  11:05 AM

## 2017-10-21 NOTE — Op Note (Signed)
[facility] Gastroenterology Patient Name: Tanya [name] Procedure Date: 10/21/2017 11:11 AM MRN: [id number] Account #: [id number] Date of Birth: [birth year] Admit Type: Outpatient Age: 57 Room: [facility] ENDO ROOM 3 Gender: Female Note Status: Finalized Procedure:            Colonoscopy Indications:          Generalized abdominal pain, Abdominal pain in the left                        lower quadrant, Rectal bleeding Providers:            [name], MD Referring MD:         Dani Gobble. Dema Severin, MD (Referring MD) Medicines:            Monitored Anesthesia Care Complications:        No immediate complications. Procedure:            Pre-Anesthesia Assessment:                       - ASA Grade Assessment: III - A patient with severe                        systemic disease.                       After obtaining informed consent, the colonoscope was                        passed under direct vision. Throughout the procedure,                        the patient's blood pressure, pulse, and oxygen                        saturations were monitored continuously. The                        Colonoscope was introduced through the anus and                        advanced to the the cecum, identified by appendiceal                        orifice and ileocecal valve. The colonoscopy was                        performed without difficulty. The patient tolerated the                        procedure well. The quality of the bowel preparation                        was good. Findings:      Many medium and small-mouthed diverticula were found in the sigmoid       colon, descending colon, transverse colon and cecum.      A single small-mouthed diverticulum was found in the mid sigmoid colon.       Peri-diverticular erythema was seen. Purulent discharge was seen in       association with the diverticular opening, suspicious of diverticulitis.      The retroflexed  view of the distal  rectum and anal verge was normal and       showed no anal or rectal abnormalities.      A 3 mm polyp was found in the hepatic flexure. The polyp was sessile.       The polyp was removed with a cold biopsy forceps. Resection and       retrieval were complete.      Two sessile polyps were found in the recto-sigmoid colon. The polyps       were 1 to 2 mm in size. These polyps were removed with a cold biopsy       forceps. Resection and retrieval were complete. Impression:           - Diverticulosis in the sigmoid colon, in the                        descending colon, in the transverse colon and in the                        cecum.                       - Mild diverticulosis in the mid sigmoid colon.                        Peri-diverticular erythema was seen. Purulent discharge                        was seen in association with the diverticular opening,                        suspicious of diverticulitis.                       - The distal rectum and anal verge are normal on                        retroflexion view.                       - One 3 mm polyp at the hepatic flexure, removed with a                        cold biopsy forceps. Resected and retrieved.                       - Two 1 to 2 mm polyps at the recto-sigmoid colon,                        removed with a cold biopsy forceps. Resected and                        retrieved. Recommendation:       - Cipro (ciprofloxacin) 500 mg PO BID for 7 days.                       - Flagyl (metronidazole) 250 mg PO QID for 7 days.                       - Return to GI clinic in 4 months. Procedure Code(s):    ---  Professional ---                       [phone], Colonoscopy, flexible; with biopsy, single or                        multiple Diagnosis Code(s):    --- Professional ---                       D12.3, Benign neoplasm of transverse colon (hepatic                        flexure or splenic flexure)                       D12.7, Benign neoplasm  of rectosigmoid junction                       R10.84, Generalized abdominal pain                       R10.32, Left lower quadrant pain                       K62.5, Hemorrhage of anus and rectum                       K57.30, Diverticulosis of large intestine without                        perforation or abscess without bleeding CPT copyright 2017 American Medical Association. All rights reserved. The codes documented in this report are preliminary and upon coder review may  be revised to meet current compliance requirements. [name], MD 10/21/2017 12:08:25 PM This report has been signed electronically. Number of Addenda: 0 Note Initiated On: 10/21/2017 11:11 AM Scope Withdrawal Time: 0 hours 10 minutes 18 seconds  Total Procedure Duration: 0 hours 23 minutes 58 seconds       [facility]

## 2017-10-21 NOTE — Anesthesia Post-op Follow-up Note (Signed)
Anesthesia QCDR form completed.        

## 2017-10-21 NOTE — Op Note (Signed)
[facility] Gastroenterology Patient Name: Tanya [name] Procedure Date: 10/21/2017 11:13 AM MRN: [id number] Account #: [id number] Date of Birth: May 16, 1960 Admit Type: Outpatient Age: 57 Room: [facility] ENDO ROOM 3 Gender: Female Note Status: Finalized Procedure:            Upper GI endoscopy Indications:          Dyspepsia, Dysphagia Providers:            [name], MD Referring MD:         Ricardo Jericho (Referring MD) Medicines:            Monitored Anesthesia Care Complications:        No immediate complications. Procedure:            Pre-Anesthesia Assessment:                       - ASA Grade Assessment: III - A patient with severe                        systemic disease.                       After obtaining informed consent, the endoscope was                        passed under direct vision. Throughout the procedure,                        the patient's blood pressure, pulse, and oxygen                        saturations were monitored continuously. The Endoscope                        was introduced through the mouth, and advanced to the                        third part of duodenum. The upper GI endoscopy was                        accomplished without difficulty. The patient tolerated                        the procedure well. Findings:      The Z-line was variable. Biopsies were taken with a cold forceps for       histology.      The exam of the esophagus was otherwise normal.      Diffuse mild inflammation characterized by congestion (edema) and       erythema was found in the gastric body. Biopsies were taken with a cold       forceps for histology.      The cardia and gastric fundus were normal on retroflexion.      Patchy mild mucosal variance characterized by altered texture was found       in the entire duodenum. Biopsies were taken with a cold forceps for       histology. Impression:           - Z-line variable. Biopsied.                     - Bile  gastritis. Biopsied.                       - Mucosal variant in the duodenum. Biopsied. Recommendation:       - Continue present medications.                       - Return to GI clinic in 4 weeks. Procedure Code(s):    --- Professional ---                       [id number], Esophagogastroduodenoscopy, flexible, transoral;                        with biopsy, single or multiple Diagnosis Code(s):    --- Professional ---                       K22.8, Other specified diseases of esophagus                       K29.60, Other gastritis without bleeding                       K31.89, Other diseases of stomach and duodenum                       R10.13, Epigastric pain                       R13.10, Dysphagia, unspecified CPT copyright 2017 American Medical Association. All rights reserved. The codes documented in this report are preliminary and upon coder review may  be revised to meet current compliance requirements. [name], MD 10/21/2017 11:33:50 AM This report has been signed electronically. Number of Addenda: 0 Note Initiated On: 10/21/2017 11:13 AM      [facility]

## 2017-10-21 NOTE — Transfer of Care (Signed)
Immediate Anesthesia Transfer of Care Note  Patient: Tanya [name]  Procedure(s) Performed: ESOPHAGOGASTRODUODENOSCOPY (EGD) (N/A ) COLONOSCOPY WITH PROPOFOL (N/A )  Patient Location: PACU and Endoscopy Unit  Anesthesia Type:General  Level of Consciousness: awake, alert  and oriented  Airway & Oxygen Therapy: Patient Spontanous Breathing  Post-op Assessment: Report given to RN  Post vital signs: stable  Last Vitals:  Vitals Value Taken Time  BP 103/60 10/21/2017 12:11 PM  Temp    Pulse 68 10/21/2017 12:11 PM  Resp 12 10/21/2017 12:11 PM  SpO2 98 % 10/21/2017 12:11 PM  Vitals shown include unvalidated device data.  Last Pain:  Vitals:   10/21/17 1210  TempSrc: (P) Tympanic  PainSc:          Complications: No apparent anesthesia complications

## 2017-10-21 NOTE — Anesthesia Postprocedure Evaluation (Signed)
Anesthesia Post Note  Patient: Tanya [name]  Procedure(s) Performed: ESOPHAGOGASTRODUODENOSCOPY (EGD) (N/A ) COLONOSCOPY WITH PROPOFOL (N/A )  Patient location during evaluation: Endoscopy Anesthesia Type: General Level of consciousness: awake and alert and oriented Pain management: pain level controlled Vital Signs Assessment: post-procedure vital signs reviewed and stable Respiratory status: spontaneous breathing Cardiovascular status: blood pressure returned to baseline Anesthetic complications: no     Last Vitals:  Vitals:   10/21/17 1031 10/21/17 1210  BP: 105/73 103/60  Pulse: 78   Resp: 16 18  Temp: (!) 35.8 C (!) 36.1 C  SpO2: 100%     Last Pain:  Vitals:   10/21/17 1210  TempSrc: Tympanic  PainSc: 0-No pain                 Kabria Hetzer

## 2017-10-21 NOTE — Anesthesia Preprocedure Evaluation (Signed)
Anesthesia Evaluation  Patient identified by MRN, date of birth, ID band Patient awake    Reviewed: Allergy & Precautions, H&P , NPO status , Patient's Chart, lab work & pertinent test results, reviewed documented beta blocker date and time   Airway Mallampati: II   Neck ROM: full    Dental  (+) Poor Dentition   Pulmonary neg pulmonary ROS, Current Smoker, former smoker,    Pulmonary exam normal        Cardiovascular negative cardio ROS Normal cardiovascular exam Rhythm:regular Rate:Normal     Neuro/Psych PSYCHIATRIC DISORDERS Anxiety Depression  Neuromuscular disease negative neurological ROS  negative psych ROS   GI/Hepatic negative GI ROS, Neg liver ROS,   Endo/Other  negative endocrine ROS  Renal/GU negative Renal ROS  negative genitourinary   Musculoskeletal  (+) Arthritis , Osteoarthritis,  Fibromyalgia -, narcotic dependent  Abdominal   Peds  Hematology negative hematology ROS (+)   Anesthesia Other Findings Past Medical History: No date: Anxiety No date: Bladder infection     Comment:  8/18 No date: Chronic lower back pain No date: Depression No date: Hyperlipidemia No date: Lupus ([place]) No date: Overactive bladder Past Surgical History: No date: ABDOMINAL HYSTERECTOMY No date: COLONOSCOPY BMI    Body Mass Index:  26.22 kg/m     Reproductive/Obstetrics negative OB ROS                             Anesthesia Physical  Anesthesia Plan  ASA: III  Anesthesia Plan: General   Post-op Pain Management:    Induction: Intravenous  PONV Risk Score and Plan:   Airway Management Planned: Nasal Cannula  Additional Equipment:   Intra-op Plan:   Post-operative Plan:   Informed Consent: I have reviewed the patients History and Physical, chart, labs and discussed the procedure including the risks, benefits and alternatives for the proposed anesthesia with the patient or  authorized representative who has indicated his/her understanding and acceptance.   Dental Advisory Given  Plan Discussed with: CRNA  Anesthesia Plan Comments:         Anesthesia Quick Evaluation

## 2017-10-22 ENCOUNTER — Encounter: Payer: Self-pay | Admitting: Gastroenterology

## 2017-10-22 LAB — SURGICAL PATHOLOGY

## 2017-10-23 ENCOUNTER — Encounter: Payer: Self-pay | Admitting: Pain Medicine

## 2017-10-23 ENCOUNTER — Other Ambulatory Visit: Payer: Self-pay

## 2017-10-23 ENCOUNTER — Ambulatory Visit: Payer: Medicaid Other | Admitting: Pain Medicine

## 2017-10-23 ENCOUNTER — Ambulatory Visit
Admission: RE | Admit: 2017-10-23 | Discharge: 2017-10-23 | Disposition: A | Discharge status: Home / Self Care | Payer: Medicaid Other | Source: Ambulatory Visit | Attending: Pain Medicine | Admitting: Pain Medicine

## 2017-10-23 VITALS — BP 136/89 | HR 82 | Temp 98.1°F | Resp 18 | Ht 63.0 in | Wt 145.0 lb

## 2017-10-23 DIAGNOSIS — Z9889 Other specified postprocedural states: Secondary | ICD-10-CM | POA: Insufficient documentation

## 2017-10-23 DIAGNOSIS — M47812 Spondylosis without myelopathy or radiculopathy, cervical region: Secondary | ICD-10-CM | POA: Insufficient documentation

## 2017-10-23 DIAGNOSIS — G8929 Other chronic pain: Secondary | ICD-10-CM | POA: Insufficient documentation

## 2017-10-23 DIAGNOSIS — Z888 Allergy status to other drugs, medicaments and biological substances status: Secondary | ICD-10-CM | POA: Diagnosis not present

## 2017-10-23 DIAGNOSIS — G8918 Other acute postprocedural pain: Secondary | ICD-10-CM

## 2017-10-23 DIAGNOSIS — Z90710 Acquired absence of both cervix and uterus: Secondary | ICD-10-CM | POA: Insufficient documentation

## 2017-10-23 DIAGNOSIS — M542 Cervicalgia: Secondary | ICD-10-CM | POA: Insufficient documentation

## 2017-10-23 MED ORDER — MIDAZOLAM HCL 5 MG/5ML IJ SOLN
1.0000 mg | INTRAMUSCULAR | Status: DC | PRN
  Administered 2017-10-23: 3 mg via INTRAVENOUS
  Filled 2017-10-23: qty 5

## 2017-10-23 MED ORDER — FENTANYL CITRATE (PF) 100 MCG/2ML IJ SOLN
25.0000 ug | INTRAMUSCULAR | Status: DC | PRN
  Administered 2017-10-23: 100 ug via INTRAVENOUS
  Filled 2017-10-23: qty 2

## 2017-10-23 MED ORDER — HYDROCODONE-ACETAMINOPHEN 5-325 MG PO TABS: 1.0000 | ORAL_TABLET | Freq: Three times a day (TID) | ORAL | 0 refills | Status: DC | PRN

## 2017-10-23 MED ORDER — DEXAMETHASONE SODIUM PHOSPHATE 10 MG/ML IJ SOLN
10.0000 mg | Freq: Once | INTRAMUSCULAR | Status: AC
  Administered 2017-10-23: 10 mg
  Filled 2017-10-23: qty 1

## 2017-10-23 MED ORDER — LIDOCAINE HCL 2 % IJ SOLN
20.0000 mL | Freq: Once | INTRAMUSCULAR | Status: AC
  Administered 2017-10-23: 400 mg
  Filled 2017-10-23: qty 40

## 2017-10-23 MED ORDER — HYDROCODONE-ACETAMINOPHEN 5-325 MG PO TABS: 1.0000 | ORAL_TABLET | Freq: Three times a day (TID) | ORAL | 0 refills | Status: AC | PRN

## 2017-10-23 MED ORDER — ROPIVACAINE HCL 2 MG/ML IJ SOLN
9.0000 mL | Freq: Once | INTRAMUSCULAR | Status: AC
  Administered 2017-10-23: 9 mL via PERINEURAL
  Filled 2017-10-23: qty 10

## 2017-10-23 MED ORDER — LACTATED RINGERS IV SOLN
1000.0000 mL | Freq: Once | INTRAVENOUS | Status: AC
  Administered 2017-10-23: 1000 mL via INTRAVENOUS

## 2017-10-23 NOTE — Patient Instructions (Signed)

## 2017-10-23 NOTE — Progress Notes (Signed)
Patient's Name: Tanya [name]  MRN: [id number]  Referring Provider: Ricardo Jericho*  DOB: 12/04/1960  PCP: Ricardo Jericho, NP  DOS: 10/23/2017  Note by: Gaspar Cola, MD  Service setting: Ambulatory outpatient  Specialty: Interventional Pain Management  Patient type: Established  Location: [facility] (AMB) Pain Management Facility  Visit type: Interventional Procedure   Primary Reason for Visit: Interventional Pain Management Treatment. CC: Neck Pain (left)  Procedure:          Anesthesia, Analgesia, Anxiolysis:  Type: Cervical Facet, Medial Branch Radiofrequency Ablation  #2 (last one done on 12/24/2016) Primary Purpose: Therapeutic Region: Posterolateral cervical spine region Level: C3, C4, C5, C6, & C7 Medial Branch Level(s). Lesioning of these levels should completely denervate the C3-4, C4-5, C5-6, and the C6-7 cervical facet joints. Laterality: Left Paraspinal  Type: Moderate (Conscious) Sedation combined with Local Anesthesia Indication(s): Analgesia and Anxiety Route: Intravenous (IV) IV Access: Secured Sedation: Meaningful verbal contact was maintained at all times during the procedure  Local Anesthetic: Lidocaine 1-2%  Position: Prone with head of the table was raised to facilitate breathing.   Indications: 1. Spondylosis without myelopathy or radiculopathy, cervical region   2. Cervical facet syndrome (Bilateral) (R>L)   3. Cervical spondylosis   4. Cervicalgia    Ms. Goers has been dealing with the above chronic pain for longer than three months and has either failed to respond, was unable to tolerate, or simply did not get enough benefit from other more conservative therapies including, but not limited to: 1. Over-the-counter medications 2. Anti-inflammatory medications 3. Muscle relaxants 4. Membrane stabilizers 5. Opioids 6. Physical therapy and/or chiropractic manipulation 7. Modalities (Heat, ice, etc.) 8. Invasive techniques such as  nerve blocks. Ms. Freas has attained more than 50% relief of the pain from a series of diagnostic injections conducted in separate occasions.  Pain Score: Pre-procedure: 4 /10 Post-procedure: 0-No pain/10  Pre-op Assessment:  Ms. Yodice is a 57 y.o. (year old), female patient, seen today for interventional treatment. She  has a past surgical history that includes Abdominal hysterectomy; Colonoscopy; Colonoscopy with propofol (N/A, 07/03/2017); Tonsillectomy; Esophagogastroduodenoscopy (N/A, 10/21/2017); and Colonoscopy with propofol (N/A, 10/21/2017). Ms. Loder has a current medication list which includes the following prescription(s): albuterol, cyclobenzaprine, duloxetine, hydroxychloroquine, nicotine, omeprazole, oxycodone, pantoprazole, duloxetine, gabapentin, hydrocodone-acetaminophen, hydrocodone-acetaminophen, oxycodone, and oxycodone, and the following Facility-Administered Medications: fentanyl and midazolam. Her primarily concern today is the Neck Pain (left)  Initial Vital Signs:  Pulse/HCG Rate: 86ECG Heart Rate: 78 Temp: 97.8 F (36.6 C) Resp: 16 BP: 129/77 SpO2: 100 %  BMI: Estimated body mass index is 25.69 kg/m as calculated from the following:   Height as of this encounter: 5\' 3"  (1.6 m).   Weight as of this encounter: 145 lb (65.8 kg).  Risk Assessment: Allergies: Reviewed. She is allergic to trazodone; bupropion; methocarbamol; oxycodone-acetaminophen; tramadol; and trazodone and nefazodone.  Allergy Precautions: None required Coagulopathies: Reviewed. None identified.  Blood-thinner therapy: None at this time Active Infection(s): Reviewed. None identified. Ms. Culliton is afebrile  Site Confirmation: Ms. Ferner was asked to confirm the procedure and laterality before marking the site Procedure checklist: Completed Consent: Before the procedure and under the influence of no sedative(s), amnesic(s), or anxiolytics, the patient was informed of the  treatment options, risks and possible complications. To fulfill our ethical and legal obligations, as recommended by the American Medical Association's Code of Ethics, I have informed the patient of my clinical impression; the nature and purpose of the treatment or procedure; the  risks, benefits, and possible complications of the intervention; the alternatives, including doing nothing; the risk(s) and benefit(s) of the alternative treatment(s) or procedure(s); and the risk(s) and benefit(s) of doing nothing. The patient was provided information about the general risks and possible complications associated with the procedure. These may include, but are not limited to: failure to achieve desired goals, infection, bleeding, organ or nerve damage, allergic reactions, paralysis, and death. In addition, the patient was informed of those risks and complications associated to Spine-related procedures, such as failure to decrease pain; infection (i.e.: Meningitis, epidural or intraspinal abscess); bleeding (i.e.: epidural hematoma, subarachnoid hemorrhage, or any other type of intraspinal or peri-dural bleeding); organ or nerve damage (i.e.: Any type of peripheral nerve, nerve root, or spinal cord injury) with subsequent damage to sensory, motor, and/or autonomic systems, resulting in permanent pain, numbness, and/or weakness of one or several areas of the body; allergic reactions; (i.e.: anaphylactic reaction); and/or death. Furthermore, the patient was informed of those risks and complications associated with the medications. These include, but are not limited to: allergic reactions (i.e.: anaphylactic or anaphylactoid reaction(s)); adrenal axis suppression; blood sugar elevation that in diabetics may result in ketoacidosis or comma; water retention that in patients with history of congestive heart failure may result in shortness of breath, pulmonary edema, and decompensation with resultant heart failure; weight gain;  swelling or edema; medication-induced neural toxicity; particulate matter embolism and blood vessel occlusion with resultant organ, and/or nervous system infarction; and/or aseptic necrosis of one or more joints. Finally, the patient was informed that Medicine is not an exact science; therefore, there is also the possibility of unforeseen or unpredictable risks and/or possible complications that may result in a catastrophic outcome. The patient indicated having understood very clearly. We have given the patient no guarantees and we have made no promises. Enough time was given to the patient to ask questions, all of which were answered to the patient's satisfaction. Ms. Cygan has indicated that she wanted to continue with the procedure. Attestation: I, the ordering provider, attest that I have discussed with the patient the benefits, risks, side-effects, alternatives, likelihood of achieving goals, and potential problems during recovery for the procedure that I have provided informed consent. Date  Time: 10/23/2017  2:29 PM  Pre-Procedure Preparation:  Monitoring: As per clinic protocol. Respiration, ETCO2, SpO2, BP, heart rate and rhythm monitor placed and checked for adequate function Safety Precautions: Patient was assessed for positional comfort and pressure points before starting the procedure. Time-out: I initiated and conducted the "Time-out" before starting the procedure, as per protocol. The patient was asked to participate by confirming the accuracy of the "Time Out" information. Verification of the correct person, site, and procedure were performed and confirmed by me, the nursing staff, and the patient. "Time-out" conducted as per Joint Commission's Universal Protocol (UP.01.01.01). Time: 1502  Description of Procedure:          Laterality: Left Level: C3, C4, C5, C6, & C7 Medial Branch Level(s). Area Prepped: Entire Posterior Cervico-thoracic Region Prepping solution: ChloraPrep (2%  chlorhexidine gluconate and 70% isopropyl alcohol) Safety Precautions: Aspiration looking for blood return was conducted prior to all injections. At no point did we inject any substances, as a needle was being advanced. Before injecting, the patient was told to immediately notify me if she was experiencing any new onset of "ringing in the ears, or metallic taste in the mouth". No attempts were made at seeking any paresthesias. Safe injection practices and needle disposal techniques used. Medications  properly checked for expiration dates. SDV (single dose vial) medications used. After the completion of the procedure, all disposable equipment used was discarded in the proper designated medical waste containers. Local Anesthesia: Protocol guidelines were followed. The patient was positioned over the fluoroscopy table. The area was prepped in the usual manner. The time-out was completed. The target area was identified using fluoroscopy. A 12-in long, straight, sterile hemostat was used with fluoroscopic guidance to locate the targets for each level blocked. Once located, the skin was marked with an approved surgical skin marker. Once all sites were marked, the skin (epidermis, dermis, and hypodermis), as well as deeper tissues (fat, connective tissue and muscle) were infiltrated with a small amount of a short-acting local anesthetic, loaded on a 10cc syringe with a 25G, 1.5-in  Needle. An appropriate amount of time was allowed for local anesthetics to take effect before proceeding to the next step. Local Anesthetic: Lidocaine 2.0% The unused portion of the local anesthetic was discarded in the proper designated containers. Technical explanation of process:  Radiofrequency Ablation (RFA) C3 Medial Branch Nerve RFA: The target area for the C3 dorsal medial articular branch is the lateral concave waist of the articular pillar of C3. Under fluoroscopic guidance, a Radiofrequency needle was inserted until contact was  made with os over the postero-lateral aspect of the articular pillar of C3 (target area). Sensory and motor testing was conducted to properly adjust the position of the needle. Once satisfactory placement of the needle was achieved, the numbing solution was slowly injected after negative aspiration for blood. 2.0 mL of the nerve block solution was injected without difficulty or complication. After waiting for at least 3 minutes, the ablation was performed. Once completed, the needle was removed intact. C4 Medial Branch Nerve RFA: The target area for the C4 dorsal medial articular branch is the lateral concave waist of the articular pillar of C4. Under fluoroscopic guidance, a Radiofrequency needle was inserted until contact was made with os over the postero-lateral aspect of the articular pillar of C4 (target area). Sensory and motor testing was conducted to properly adjust the position of the needle. Once satisfactory placement of the needle was achieved, the numbing solution was slowly injected after negative aspiration for blood. 2.0 mL of the nerve block solution was injected without difficulty or complication. After waiting for at least 3 minutes, the ablation was performed. Once completed, the needle was removed intact. C5 Medial Branch Nerve RFA: The target area for the C5 dorsal medial articular branch is the lateral concave waist of the articular pillar of C5. Under fluoroscopic guidance, a Radiofrequency needle was inserted until contact was made with os over the postero-lateral aspect of the articular pillar of C5 (target area). Sensory and motor testing was conducted to properly adjust the position of the needle. Once satisfactory placement of the needle was achieved, the numbing solution was slowly injected after negative aspiration for blood. 2.0 mL of the nerve block solution was injected without difficulty or complication. After waiting for at least 3 minutes, the ablation was performed. Once  completed, the needle was removed intact. C6 Medial Branch Nerve RFA: The target area for the C6 dorsal medial articular branch is the lateral concave waist of the articular pillar of C6. Under fluoroscopic guidance, a Radiofrequency needle was inserted until contact was made with os over the postero-lateral aspect of the articular pillar of C6 (target area). Sensory and motor testing was conducted to properly adjust the position of the needle. Once satisfactory  placement of the needle was achieved, the numbing solution was slowly injected after negative aspiration for blood. 2.0 mL of the nerve block solution was injected without difficulty or complication. After waiting for at least 3 minutes, the ablation was performed. Once completed, the needle was removed intact. C7 Medial Branch Nerve RFA: The target for the C7 dorsal medial articular branch lies on the superior-medial tip of the C7 transverse process. Under fluoroscopic guidance, a Radiofrequency needle was inserted until contact was made with os over the postero-lateral aspect of the articular pillar of C7 (target area). Sensory and motor testing was conducted to properly adjust the position of the needle. Once satisfactory placement of the needle was achieved, the numbing solution was slowly injected after negative aspiration for blood. 2.0 mL of the nerve block solution was injected without difficulty or complication. After waiting for at least 3 minutes, the ablation was performed. Once completed, the needle was removed intact. Radiofrequency lesioning (ablation):  Radiofrequency Generator: NeuroTherm [id number] Sensory Stimulation Parameters: 50 Hz was used to locate & identify the nerve, making sure that the needle was positioned such that there was no sensory stimulation below 0.3 V or above 0.7 V. Motor Stimulation Parameters: 2 Hz was used to evaluate the motor component. Care was taken not to lesion any nerves that demonstrated motor stimulation  of the lower extremities at an output of less than 2.5 times that of the sensory threshold, or a maximum of 2.0 V. Lesioning Technique Parameters: Standard Radiofrequency settings. (Not bipolar or pulsed.) Temperature Settings: 80 degrees C Lesioning time: 60 seconds Intra-operative Compliance: Compliant Materials & Medications: Needle(s) (Electrode/Cannula) Type: Teflon-coated, curved tip, Radiofrequency needle(s) Gauge: 22G Length: 10cm Numbing solution: 0.2% PF-Ropivacaine + Triamcinolone (40 mg/mL) diluted to a final concentration of 4 mg of Triamcinolone/mL of Ropivacaine The unused portion of the solution was discarded in the proper designated containers.  Once the entire procedure was completed, the treated area was cleaned, making sure to leave some of the prepping solution back to take advantage of its long term bactericidal properties.  Intra-operative Compliance: Compliant  Vitals:   10/23/17 1540 10/23/17 1545 10/23/17 1555 10/23/17 1606  BP: 132/74 (!) 141/87 (!) 130/98 136/89  Pulse: 82     Resp: 15 10 18 18   Temp:  98.7 F (37.1 C)  98.1 F (36.7 C)  SpO2: 97% 96% 99% 99%  Weight:      Height:        Start Time: 1502 hrs. End Time: 1537 hrs.  Imaging Guidance (Spinal):          Type of Imaging Technique: Fluoroscopy Guidance (Spinal) Indication(s): Assistance in needle guidance and placement for procedures requiring needle placement in or near specific anatomical locations not easily accessible without such assistance. Exposure Time: Please see nurses notes. Contrast: None used. Fluoroscopic Guidance: I was personally present during the use of fluoroscopy. "Tunnel Vision Technique" used to obtain the best possible view of the target area. Parallax error corrected before commencing the procedure. "Direction-depth-direction" technique used to introduce the needle under continuous pulsed fluoroscopy. Once target was reached, antero-posterior, oblique, and lateral  fluoroscopic projection used confirm needle placement in all planes. Images permanently stored in EMR. Interpretation: No contrast injected. I personally interpreted the imaging intraoperatively. Adequate needle placement confirmed in multiple planes. Permanent images saved into the patient's record.  Antibiotic Prophylaxis:   Anti-infectives (From admission, onward)   None     Indication(s): None identified  Post-operative Assessment:  Post-procedure Vital Signs:  Pulse/HCG Rate: 8285 Temp: 98.1 F (36.7 C) Resp: 18 BP: 136/89 SpO2: 99 %  EBL: None  Complications: No immediate post-treatment complications observed by team, or reported by patient.  Note: The patient tolerated the entire procedure well. A repeat set of vitals were taken after the procedure and the patient was kept under observation following institutional policy, for this type of procedure. Post-procedural neurological assessment was performed, showing return to baseline, prior to discharge. The patient was provided with post-procedure discharge instructions, including a section on how to identify potential problems. Should any problems arise concerning this procedure, the patient was given instructions to immediately contact us, at any time, without hesitation. In any case, we plan to contact the patient by telephone for a follow-up status report regarding this interventional procedure.  Comments:  No additional relevant information.  Plan of Care   Possible POC:  Therapeutic left-sided cervical facet radiofrequency ablation #2 under fluoroscopic guidance and IV sedation (last one done on 06/26/2016)    Imaging Orders     DG C-Arm 1-60 Min-No Report  Procedure Orders     Radiofrequency,Cervical  Medications ordered for procedure: Meds ordered this encounter  Medications  . lidocaine (XYLOCAINE) 2 % (with pres) injection 400 mg  . midazolam (VERSED) 5 MG/5ML injection 1-2 mg    Make sure Flumazenil is  available in the pyxis when using this medication. If oversedation occurs, administer 0.2 mg IV over 15 sec. If after 45 sec no response, administer 0.2 mg again over 1 min; may repeat at 1 min intervals; not to exceed 4 doses (1 mg)  . fentaNYL (SUBLIMAZE) injection 25-50 mcg    Make sure Narcan is available in the pyxis when using this medication. In the event of respiratory depression (RR< 8/min): Titrate NARCAN (naloxone) in increments of 0.1 to 0.2 mg IV at 2-3 minute intervals, until desired degree of reversal.  . lactated ringers infusion 1,000 mL  . ropivacaine (PF) 2 mg/mL (0.2%) (NAROPIN) injection 9 mL  . dexamethasone (DECADRON) injection 10 mg  . HYDROcodone-acetaminophen (NORCO/VICODIN) 5-325 MG tablet    Sig: Take 1 tablet by mouth every 8 (eight) hours as needed for up to 7 days for severe pain.    Dispense:  21 tablet    Refill:  0    For acute post-operative pain. Not to be refilled. To last 7 days.  Marland Kitchen HYDROcodone-acetaminophen (NORCO/VICODIN) 5-325 MG tablet    Sig: Take 1 tablet by mouth every 8 (eight) hours as needed for up to 7 days for severe pain.    Dispense:  21 tablet    Refill:  0    For acute post-operative pain. Not to be refilled. To last 7 days.   Medications administered: We administered lidocaine, midazolam, fentaNYL, lactated ringers, ropivacaine (PF) 2 mg/mL (0.2%), and dexamethasone.  See the medical record for exact dosing, route, and time of administration.  Disposition: Discharge home  Discharge Date & Time: 10/23/2017; 1607 hrs.   Physician-requested Follow-up: Return for contralateral RFA (2 wks): (R) C-FCT RFA.  Future Appointments  Date Time Provider [place]  10/30/2017 10:30 AM Vevelyn Francois, NP [facility] None  12/04/2017  9:15 AM Vevelyn Francois, NP [facility] None   Primary Care Physician: Ricardo Jericho, NP Location: [facility] Outpatient Pain Management Facility Note by: Gaspar Cola, MD Date: 10/23/2017; Time:  4:23 PM  Disclaimer:  Medicine is not an Chief Strategy Officer. The only guarantee in medicine is that nothing is guaranteed. It is important  to note that the decision to proceed with this intervention was based on the information collected from the patient. The Data and conclusions were drawn from the patient's questionnaire, the interview, and the physical examination. Because the information was provided in large part by the patient, it cannot be guaranteed that it has not been purposely or unconsciously manipulated. Every effort has been made to obtain as much relevant data as possible for this evaluation. It is important to note that the conclusions that lead to this procedure are derived in large part from the available data. Always take into account that the treatment will also be dependent on availability of resources and existing treatment guidelines, considered by other Pain Management Practitioners as being common knowledge and practice, at the time of the intervention. For Medico-Legal purposes, it is also important to point out that variation in procedural techniques and pharmacological choices are the acceptable norm. The indications, contraindications, technique, and results of the above procedure should only be interpreted and judged by a Board-Certified Interventional Pain Specialist with extensive familiarity and expertise in the same exact procedure and technique.

## 2017-10-24 ENCOUNTER — Telehealth: Payer: Self-pay | Admitting: *Deleted

## 2017-10-24 NOTE — Telephone Encounter (Signed)
Callback re; procedure, voicemail left to call our office if there are questions or concerns.

## 2017-10-30 ENCOUNTER — Encounter: Payer: Medicaid Other | Admitting: Nurse Practitioner

## 2017-11-04 ENCOUNTER — Telehealth: Payer: Self-pay

## 2017-11-04 NOTE — Telephone Encounter (Signed)
Advised that increased soreness is expected several weeks after RFA.

## 2017-11-04 NOTE — Telephone Encounter (Signed)
Pt called and is sore on the leftside of her head.after procedure on the 3rd for left side of neck and back Pt wants to know if its normal, please call

## 2017-11-06 ENCOUNTER — Ambulatory Visit: Payer: Medicaid Other | Attending: Nurse Practitioner | Admitting: Nurse Practitioner

## 2017-11-06 ENCOUNTER — Encounter: Payer: Self-pay | Admitting: Nurse Practitioner

## 2017-11-06 ENCOUNTER — Other Ambulatory Visit: Payer: Self-pay

## 2017-11-06 VITALS — BP 120/82 | HR 81 | Temp 97.4°F | Resp 18 | Ht 63.0 in | Wt 145.0 lb

## 2017-11-06 DIAGNOSIS — Z79899 Other long term (current) drug therapy: Secondary | ICD-10-CM | POA: Diagnosis not present

## 2017-11-06 DIAGNOSIS — M19011 Primary osteoarthritis, right shoulder: Secondary | ICD-10-CM | POA: Insufficient documentation

## 2017-11-06 DIAGNOSIS — Z9889 Other specified postprocedural states: Secondary | ICD-10-CM | POA: Insufficient documentation

## 2017-11-06 DIAGNOSIS — Z888 Allergy status to other drugs, medicaments and biological substances status: Secondary | ICD-10-CM | POA: Diagnosis not present

## 2017-11-06 DIAGNOSIS — F411 Generalized anxiety disorder: Secondary | ICD-10-CM | POA: Insufficient documentation

## 2017-11-06 DIAGNOSIS — M542 Cervicalgia: Secondary | ICD-10-CM | POA: Diagnosis not present

## 2017-11-06 DIAGNOSIS — M797 Fibromyalgia: Secondary | ICD-10-CM | POA: Insufficient documentation

## 2017-11-06 DIAGNOSIS — M19012 Primary osteoarthritis, left shoulder: Secondary | ICD-10-CM | POA: Diagnosis not present

## 2017-11-06 DIAGNOSIS — Z87891 Personal history of nicotine dependence: Secondary | ICD-10-CM | POA: Insufficient documentation

## 2017-11-06 DIAGNOSIS — F339 Major depressive disorder, recurrent, unspecified: Secondary | ICD-10-CM | POA: Insufficient documentation

## 2017-11-06 DIAGNOSIS — M47812 Spondylosis without myelopathy or radiculopathy, cervical region: Secondary | ICD-10-CM | POA: Diagnosis not present

## 2017-11-06 DIAGNOSIS — Z5181 Encounter for therapeutic drug level monitoring: Secondary | ICD-10-CM | POA: Insufficient documentation

## 2017-11-06 DIAGNOSIS — M7918 Myalgia, other site: Secondary | ICD-10-CM | POA: Diagnosis not present

## 2017-11-06 DIAGNOSIS — K5792 Diverticulitis of intestine, part unspecified, without perforation or abscess without bleeding: Secondary | ICD-10-CM | POA: Diagnosis not present

## 2017-11-06 DIAGNOSIS — Z79891 Long term (current) use of opiate analgesic: Secondary | ICD-10-CM | POA: Diagnosis not present

## 2017-11-06 DIAGNOSIS — G894 Chronic pain syndrome: Secondary | ICD-10-CM | POA: Insufficient documentation

## 2017-11-06 DIAGNOSIS — M4722 Other spondylosis with radiculopathy, cervical region: Secondary | ICD-10-CM | POA: Diagnosis not present

## 2017-11-06 DIAGNOSIS — Z833 Family history of diabetes mellitus: Secondary | ICD-10-CM | POA: Insufficient documentation

## 2017-11-06 MED ORDER — OXYCODONE HCL 5 MG PO TABS: 5.0000 mg | ORAL_TABLET | Freq: Two times a day (BID) | ORAL | 0 refills | Status: DC

## 2017-11-06 MED ORDER — GABAPENTIN 300 MG PO CAPS: 300.0000 mg | ORAL_CAPSULE | Freq: Three times a day (TID) | ORAL | 2 refills | Status: DC

## 2017-11-06 MED ORDER — CYCLOBENZAPRINE HCL 10 MG PO TABS: 10.0000 mg | ORAL_TABLET | Freq: Three times a day (TID) | ORAL | 2 refills | Status: DC | PRN

## 2017-11-06 NOTE — Progress Notes (Signed)
Nursing Pain Medication Assessment:  Safety precautions to be maintained throughout the outpatient stay will include: orient to surroundings, keep bed in low position, maintain call bell within reach at all times, provide assistance with transfer out of bed and ambulation.   Medication Inspection Compliance: Pill count conducted under aseptic conditions, in front of the patient. Neither the pills nor the bottle was removed from the patient's sight at any time. Once count was completed pills were immediately returned to the patient in their original bottle.  Medication: Oxycodone IR Pill/Patch Count: 0 of 60 pills remain Pill/Patch Appearance: Markings consistent with prescribed medication Bottle Appearance: Standard pharmacy container. Clearly labeled. Filled Date: 09 / 19 / 2019 Last Medication intake:  Yesterday

## 2017-11-06 NOTE — Patient Instructions (Addendum)
____________________________________________________________________________________________  Medication Rules  Applies to: All patients receiving prescriptions (written or electronic).  Pharmacy of record: Pharmacy where electronic prescriptions will be sent. If written prescriptions are taken to a different pharmacy, please inform the nursing staff. The pharmacy listed in the electronic medical record should be the one where you would like electronic prescriptions to be sent.  Prescription refills: Only during scheduled appointments. Applies to both, written and electronic prescriptions.  NOTE: The following applies primarily to controlled substances (Opioid* Pain Medications).   Patient's responsibilities: 1. Pain Pills: Bring all pain pills to every appointment (except for procedure appointments). 2. Pill Bottles: Bring pills in original pharmacy bottle. Always bring newest bottle. Bring bottle, even if empty. 3. Medication refills: You are responsible for knowing and keeping track of what medications you need refilled. The day before your appointment, write a list of all prescriptions that need to be refilled. Bring that list to your appointment and give it to the admitting nurse. Prescriptions will be written only during appointments. If you forget a medication, it will not be "Called in", "Faxed", or "electronically sent". You will need to get another appointment to get these prescribed. 4. Prescription Accuracy: You are responsible for carefully inspecting your prescriptions before leaving our office. Have the discharge nurse carefully go over each prescription with you, before taking them home. Make sure that your name is accurately spelled, that your address is correct. Check the name and dose of your medication to make sure it is accurate. Check the number of pills, and the written instructions to make sure they are clear and accurate. Make sure that you are given enough medication to last  until your next medication refill appointment. 5. Taking Medication: Take medication as prescribed. Never take more pills than instructed. Never take medication more frequently than prescribed. Taking less pills or less frequently is permitted and encouraged, when it comes to controlled substances (written prescriptions).  6. Inform other Doctors: Always inform, all of your healthcare providers, of all the medications you take. 7. Pain Medication from other Providers: You are not allowed to accept any additional pain medication from any other Doctor or Healthcare provider. There are two exceptions to this rule. (see below) In the event that you require additional pain medication, you are responsible for notifying us, as stated below. 8. Medication Agreement: You are responsible for carefully reading and following our Medication Agreement. This must be signed before receiving any prescriptions from our practice. Safely store a copy of your signed Agreement. Violations to the Agreement will result in no further prescriptions. (Additional copies of our Medication Agreement are available upon request.) 9. Laws, Rules, & Regulations: All patients are expected to follow all Federal and State Laws, Statutes, Rules, & Regulations. Ignorance of the Laws does not constitute a valid excuse. The use of any illegal substances is prohibited. 10. Adopted CDC guidelines & recommendations: Target dosing levels will be at or below 60 MME/day. Use of benzodiazepines** is not recommended.  Exceptions: There are only two exceptions to the rule of not receiving pain medications from other Healthcare Providers. 1. Exception #1 (Emergencies): In the event of an emergency (i.e.: accident requiring emergency care), you are allowed to receive additional pain medication. However, you are responsible for: As soon as you are able, call our office [phone], at any time of the day or night, and leave a message stating your name, the  date and nature of the emergency, and the name and dose of the medication   prescribed. In the event that your call is answered by a member of our staff, make sure to document and save the date, time, and the name of the person that took your information.  2. Exception #2 (Planned Surgery): In the event that you are scheduled by another doctor or dentist to have any type of surgery or procedure, you are allowed (for a period no longer than 30 days), to receive additional pain medication, for the acute post-op pain. However, in this case, you are responsible for picking up a copy of our "Post-op Pain Management for Surgeons" handout, and giving it to your surgeon or dentist. This document is available at our office, and does not require an appointment to obtain it. Simply go to our office during business hours (Monday-Thursday from 8:00 AM to 4:00 PM) (Friday 8:00 AM to 12:00 Noon) or if you have a scheduled appointment with [place], prior to your surgery, and ask for it by name. In addition, you will need to provide [place] with your name, name of your surgeon, type of surgery, and date of procedure or surgery.  *Opioid medications include: morphine, codeine, oxycodone, oxymorphone, hydrocodone, hydromorphone, meperidine, tramadol, tapentadol, buprenorphine, fentanyl, methadone. **Benzodiazepine medications include: diazepam (Valium), alprazolam (Xanax), clonazepam (Klonopine), lorazepam (Ativan), clorazepate (Tranxene), chlordiazepoxide (Librium), estazolam (Prosom), oxazepam (Serax), temazepam (Restoril), triazolam (Halcion) (Last updated: 03/20/2017) ____________________________________________________________________________________________   ALL prescriptions were e-scribed to your pharmacy of choice.

## 2017-11-06 NOTE — Progress Notes (Signed)
Patient's Name: Tanya [name]  MRN: [id number]  Referring Provider: Ricardo Jericho*  DOB: [birth year]  PCP: Ricardo Jericho, NP  DOS: 11/06/2017  Note by: Vevelyn Francois NP  Service setting: Ambulatory outpatient  Specialty: Interventional Pain Management  Location: [facility] (AMB) Pain Management Facility    Patient type: Established    Primary Reason(s) for Visit: Encounter for prescription drug management. (Level of risk: moderate)  CC: Pain (burning pain on her head- crown of head down left side)  HPI  Tanya [name] is a 57 y.o. year old, female patient, who comes today for a medication management evaluation. She has Anxiety; Chronic upper back pain (Secondary source of pain) (Bilateral) (R>L); Chronic discoid lupus erythematosus; Chronic low back pain [facility] source of pain) (Bilateral) (R>L); Chronic neck pain (Primary Source of Pain) (Bilateral) (R>L); Discoid lupus; Diverticulitis; Encounter for long-term (current) use of high-risk medication; Fibromyalgia; Generalized anxiety disorder; Hypercholesterolemia; Hyperlipidemia; Major depressive disorder, recurrent ([place]); Pain medication agreement signed; Osteoarthritis; SLE (systemic lupus erythematosus) ([place]); Urinary incontinence; Long term prescription benzodiazepine use; Long term current use of opiate analgesic; Long term prescription opiate use; Opiate use (10 MME/Day); Chronic pain syndrome; Chronic upper extremity pain (Right); Chronic cervical radicular pain (Right); Chronic shoulder pain (Bilateral) (R>L); 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 Her primarily concern today is the Pain (burning pain on her head- crown of head down left side)  Pain Assessment: Location: Left Head Radiating: left side of head- starts at crown area down left side to left ear, near hair line Onset: 1 to 4 weeks ago Duration: Chronic pain Quality: Aching, Burning, Constant Severity: 7 /10 (subjective, self-reported pain score)  Note: Reported level is compatible with observation. Clinically the patient looks like a 2/10 A 2/10 is viewed as "Mild to Moderate" and described as noticeable and distracting. Impossible to hide from other people. More frequent flare-ups. Still possible to adapt and function close to normal. It can be very annoying and may have occasional stronger flare-ups. With discipline, patients may get used to it and adapt. Information on the proper use of the pain scale provided to the patient today. When using our objective Pain Scale, levels between 6 and 10/10 are said to belong in an emergency room, as it progressively worsens from a 6/10, described as severely limiting, requiring emergency care not usually available at an outpatient pain management facility. At a 6/10 level, communication becomes difficult and requires great effort. Assistance to reach the emergency department may be required. Facial flushing and profuse sweating along with potentially dangerous increases in heart rate and blood pressure will be evident. Effect on ADL:   Timing: Constant Modifying factors: meciations, rest, heat BP: 120/82  HR: 81  Tanya [name] was last scheduled for an appointment on 10/30/2017 for medication management. During today's appointment we reviewed Tanya [name]'s chronic pain status, as well as her outpatient medication regimen.  She admits that she is having some burning pain up in her head and down her neck into her shoulder.  She is status post left-sided radiofrequency ablation on 10/23/2017.  The patient  reports that she does not  use drugs. Her body mass index is 25.69 kg/m.  Further details on both, my assessment(s), as well as the  proposed treatment plan, please see below.  Controlled Substance Pharmacotherapy Assessment REMS (Risk Evaluation and Mitigation Strategy)  Analgesic:Oxycodone IR 5 mg 1 tablet by mouth twice a day (10 mg/dayof oxycodone). MME/day:91m/day. SHart Rochester RN  11/06/2017  3:33 PM  Sign at close encounter Nursing Pain Medication Assessment:  Safety precautions to be maintained throughout the outpatient stay will include: orient to surroundings, keep bed in low position, maintain call bell within reach at all times, provide assistance with transfer out of bed and ambulation.   Medication Inspection Compliance: Pill count conducted under aseptic conditions, in front of the patient. Neither the pills nor the bottle was removed from the patient's sight at any time. Once count was completed pills were immediately returned to the patient in their original bottle.  Medication: Oxycodone IR Pill/Patch Count: 0 of 60 pills remain Pill/Patch Appearance: Markings consistent with prescribed medication Bottle Appearance: Standard pharmacy container. Clearly labeled. Filled Date: 09 / 19 / 2019 Last Medication intake:  Yesterday   Pharmacokinetics: Liberation and absorption (onset of action): WNL Distribution (time to peak effect): WNL Metabolism and excretion (duration of action): WNL         Pharmacodynamics: Desired effects: Analgesia: Tanya [name] >50% benefit. Functional ability: Patient reports that medication allows her to accomplish basic ADLs Clinically meaningful improvement in function (CMIF): Sustained CMIF goals met Perceived effectiveness: Described as relatively effective, allowing for increase in activities of daily living (ADL) Undesirable effects: Side-effects or Adverse reactions: None reported Monitoring: [place] PMP: Online review of the past 187-montheriod  conducted. Compliant with practice rules and regulations Last UDS on record: Summary  Date Value Ref Range Status  09/03/2017 FINAL  Final    Comment:    ==================================================================== TOXASSURE SELECT 13 (MW) ==================================================================== Test                             Result       Flag       Units Drug Absent but Declared for Prescription Verification   Oxycodone                      Not Detected UNEXPECTED ng/mg creat ==================================================================== Test                      Result    Flag   Units      Ref Range   Creatinine              39               mg/dL      >=20 ==================================================================== Declared Medications:  The flagging and interpretation on this report are based on the  following declared medications.  Unexpected results may arise from  inaccuracies in the declared medications.  **Note: The testing scope of this panel includes these medications:  Oxycodone  **Note: The testing scope of this panel does not include following  reported medications:  Albuterol (Proventil)  Cyclobenzaprine (Flexeril)  Duloxetine (Cymbalta)  Gabapentin (Neurontin)  Hydroxychloroquine (Plaquenil)  Omeprazole (Prilosec) ==================================================================== For clinical consultation, please call [phone]====    UDS interpretation: Unexpected findings:          Medication Assessment Form: Reviewed. Patient indicates being compliant with therapy Treatment compliance: Deficiencies noted and steps taken to remind the patient of the seriousness of adequate therapy compliance Risk Assessment Profile: Aberrant behavior: See prior evaluations. None observed or  detected today Comorbid factors increasing risk of overdose: See prior notes.  No additional risks detected today Opioid risk tool (ORT) (Total Score): 0 Personal History of Substance Abuse (SUD-Substance use disorder):  Alcohol: Negative  Illegal Drugs: Negative  Rx Drugs: Negative  ORT Risk Level calculation: Low Risk Risk of substance use disorder (SUD): Low Opioid Risk Tool - 11/06/17 1015      Family History of Substance Abuse   Alcohol  Negative    Illegal Drugs  Negative    Rx Drugs  Negative      Personal History of Substance Abuse   Alcohol  Negative    Illegal Drugs  Negative    Rx Drugs  Negative      Age   Age between 20-45 years   No      History of Preadolescent Sexual Abuse   History of Preadolescent Sexual Abuse  Negative or Female      Psychological Disease   Psychological Disease  Negative    Depression  Negative      Total Score   Opioid Risk Tool Scoring  0    Opioid Risk Interpretation  Low Risk      ORT Scoring interpretation table:  Score <3 = Low Risk for SUD  Score between 4-7 = Moderate Risk for SUD  Score >8 = High Risk for Opioid Abuse   Risk Mitigation Strategies:  Patient Counseling: Covered Patient-Prescriber Agreement (PPA): Present and active  Notification to other healthcare providers: Done  Pharmacologic Plan: No change in therapy, at this time.             Laboratory Chemistry  Inflammation Markers (CRP: Acute Phase) (ESR: Chronic Phase) Lab Results  Component Value Date   CRP 2 09/03/2017   ESRSEDRATE 21 09/03/2017                         Rheumatology Markers No results found for: RF, ANA, LABURIC, URICUR, LYMEIGGIGMAB, LYMEABIGMQN, HLAB27                      Renal Function Markers Lab Results  Component Value Date   BUN 10 09/03/2017   CREATININE 0.67 09/03/2017   BCR 15 09/03/2017   GFRAA 114 09/03/2017   GFRNONAA 99 09/03/2017                             Hepatic Function Markers Lab Results  Component Value Date   AST 15 09/03/2017   ALT 16 09/08/2016   ALBUMIN 4.4 09/03/2017    ALKPHOS 30 (L) 09/03/2017   LIPASE 37 09/08/2016                        Electrolytes Lab Results  Component Value Date   NA 137 09/03/2017   K 4.2 09/03/2017   CL 101 09/03/2017   CALCIUM 9.4 09/03/2017   MG 1.9 09/03/2017                        Neuropathy Markers Lab Results  Component Value Date   VITAMINB12 296 09/03/2017                        CNS Tests No results found for: COLORCSF, APPEARCSF, RBCCOUNTCSF, WBCCSF, POLYSCSF, LYMPHSCSF, EOSCSF, PROTEINCSF, GLUCCSF, JCVIRUS, CSFOLI, IGGCSF  Bone Pathology Markers Lab Results  Component Value Date   25OHVITD1 25 (L) 09/03/2017   25OHVITD2 2.9 09/03/2017   25OHVITD3 22 09/03/2017                         Coagulation Parameters Lab Results  Component Value Date   PLT 375 09/08/2016                        Cardiovascular Markers Lab Results  Component Value Date   HGB 13.4 09/08/2016   HCT 40.0 09/08/2016                         CA Markers No results found for: CEA, CA125, LABCA2                      Note: Lab results reviewed.  Recent Diagnostic Imaging Results  DG C-Arm 1-60 Min-No Report Fluoroscopy was utilized by the requesting physician.  No radiographic  interpretation.   Complexity Note: Imaging results reviewed. Results shared with Tanya [name], using Layman's terms.                         Meds   Current Outpatient Medications:  .  albuterol (PROVENTIL HFA;VENTOLIN HFA) 108 (90 Base) MCG/ACT inhaler, Inhale 1-2 puffs into the lungs every 6 (six) hours as needed for wheezing or shortness of breath., Disp: 1 Inhaler, Rfl: 0 .  cyclobenzaprine (FLEXERIL) 10 MG tablet, Take 1 tablet (10 mg total) by mouth 3 (three) times daily as needed for muscle spasms., Disp: 90 tablet, Rfl: 2 .  DULoxetine (CYMBALTA) 30 MG capsule, Take 30 mg by mouth daily., Disp: , Rfl:  .  gabapentin (NEURONTIN) 300 MG capsule, Take 1 capsule (300 mg total) by mouth 3 (three) times daily., Disp: 90  capsule, Rfl: 2 .  hydroxychloroquine (PLAQUENIL) 200 MG tablet, Take by mouth., Disp: , Rfl:  .  nicotine (NICODERM CQ - DOSED IN MG/24 HOURS) 21 mg/24hr patch, Place 21 mg onto the skin daily., Disp: , Rfl:  .  [START ON 01/05/2018] oxyCODONE (OXY IR/ROXICODONE) 5 MG immediate release tablet, Take 1 tablet (5 mg total) by mouth 2 (two) times daily., Disp: 60 tablet, Rfl: 0 .  pantoprazole (PROTONIX) 40 MG tablet, Take 40 mg by mouth daily., Disp: , Rfl:  .  DULoxetine (CYMBALTA) 30 MG capsule, TAKE 1 CAPSULE(30 MG) BY MOUTH EVERY DAY, Disp: , Rfl:  .  [START ON 12/06/2017] oxyCODONE (OXY IR/ROXICODONE) 5 MG immediate release tablet, Take 1 tablet (5 mg total) by mouth 2 (two) times daily., Disp: 60 tablet, Rfl: 0 .  oxyCODONE (OXY IR/ROXICODONE) 5 MG immediate release tablet, Take 1 tablet (5 mg total) by mouth 2 (two) times daily., Disp: 60 tablet, Rfl: 0  ROS  Constitutional: Denies any fever or chills Gastrointestinal: No reported hemesis, hematochezia, vomiting, or acute GI distress Musculoskeletal: Denies any acute onset joint swelling, redness, loss of ROM, or weakness Neurological: No reported episodes of acute onset apraxia, aphasia, dysarthria, agnosia, amnesia, paralysis, loss of coordination, or loss of consciousness  Allergies  Tanya [name] is allergic to trazodone; bupropion; methocarbamol; oxycodone-acetaminophen; tramadol; and trazodone and nefazodone.  Tanya [name]  Drug: Tanya [name]  reports that she does not use drugs. Alcohol:  reports that she does not drink alcohol. Tobacco:  reports that she quit smoking about 4 months ago. Her  smoking use included cigarettes. She has a 35.00 pack-year smoking history. She has never used smokeless tobacco. Medical:  has a past medical history of Anxiety, Bladder infection, Chronic lower back pain, Chronic neck pain, Depression, Diverticulitis, Hyperlipidemia, Lupus ([place]), and Overactive bladder. Surgical: Tanya [name]  has a past  surgical history that includes Abdominal hysterectomy; Colonoscopy; Colonoscopy with propofol (N/A, 07/03/2017); Tonsillectomy; Esophagogastroduodenoscopy (N/A, 10/21/2017); and Colonoscopy with propofol (N/A, 10/21/2017). Family: family history includes Cancer in her mother; Diabetes in her brother; Emphysema in her mother; Glaucoma in her father; Heart disease in her father; Hypertension in her mother; Stroke in her mother.  Constitutional Exam  General appearance: Well nourished, well developed, and well hydrated. In no apparent acute distress Vitals:   11/06/17 1005  BP: 120/82  Pulse: 81  Resp: 18  Temp: (!) 97.4 F (36.3 C)  TempSrc: Oral  SpO2: 98%  Weight: 145 lb (65.8 kg)  Height: 5' 3"  (1.6 m)   BMI Assessment: Estimated body mass index is 25.69 kg/m as calculated from the following:   Height as of this encounter: 5' 3"  (1.6 m).   Weight as of this encounter: 145 lb (65.8 kg). Psych/Mental status: Alert, oriented x 3 (person, place, & time)       Eyes: PERLA Respiratory: No evidence of acute respiratory distress  Cervical Spine Area Exam  Skin & Axial Inspection: No masses, redness, edema, swelling, or associated skin lesions Alignment: Symmetrical Functional ROM: Unrestricted ROM      Stability: No instability detected Muscle Tone/Strength: Functionally intact. No obvious neuro-muscular anomalies detected. Sensory (Neurological): Unimpaired Palpation: Complains of area being tender to palpation              Upper Extremity (UE) Exam    Side: Right upper extremity  Side: Left upper extremity  Skin & Extremity Inspection: Skin color, temperature, and hair growth are WNL. No peripheral edema or cyanosis. No masses, redness, swelling, asymmetry, or associated skin lesions. No contractures.  Skin & Extremity Inspection: Skin color, temperature, and hair growth are WNL. No peripheral edema or cyanosis. No masses, redness, swelling, asymmetry, or associated skin lesions. No  contractures.  Functional ROM: Unrestricted ROM          Functional ROM: Unrestricted ROM          Muscle Tone/Strength: Functionally intact. No obvious neuro-muscular anomalies detected.  Muscle Tone/Strength: Functionally intact. No obvious neuro-muscular anomalies detected.  Sensory (Neurological): Unimpaired          Sensory (Neurological): Unimpaired          Palpation: No palpable anomalies              Palpation: No palpable anomalies              Provocative Test(s):  Phalen's test: deferred Tinel's test: deferred Apley's scratch test (touch opposite shoulder):  Action 1 (Across chest): deferred Action 2 (Overhead): deferred Action 3 (LB reach): deferred   Provocative Test(s):  Phalen's test: deferred Tinel's test: deferred Apley's scratch test (touch opposite shoulder):  Action 1 (Across chest): deferred Action 2 (Overhead): deferred Action 3 (LB reach): deferred    Gait & Posture Assessment  Ambulation: Unassisted Gait: Relatively normal for age and body habitus Posture: WNL   Assessment  Primary Diagnosis & Pertinent Problem List: The primary encounter diagnosis was Cervical spondylosis. Diagnoses of Cervicalgia, Cervical facet syndrome (Bilateral) (R>L), Musculoskeletal pain, Fibromyalgia, Chronic pain syndrome, and Long term prescription opiate use were also pertinent to this visit.  Status  Diagnosis  Persistent Persistent Persistent 1. Cervical spondylosis   2. Cervicalgia   3. Cervical facet syndrome (Bilateral) (R>L)   4. Musculoskeletal pain   5. Fibromyalgia   6. Chronic pain syndrome   7. Long term prescription opiate use     Problems updated and reviewed during this visit: No problems updated. Plan of Care  Pharmacotherapy (Medications Ordered): Meds ordered this encounter  Medications  . cyclobenzaprine (FLEXERIL) 10 MG tablet    Sig: Take 1 tablet (10 mg total) by mouth 3 (three) times daily as needed for muscle spasms.    Dispense:  90 tablet     Refill:  2    Do not add this medication to the electronic "Automatic Refill" notification system. Patient may have prescription filled one day early if pharmacy is closed on scheduled refill date.    Order Specific Question:   Supervising Provider    Answer:   Milinda Pointer [phone]  . gabapentin (NEURONTIN) 300 MG capsule    Sig: Take 1 capsule (300 mg total) by mouth 3 (three) times daily.    Dispense:  90 capsule    Refill:  2    Do not add this medication to the electronic "Automatic Refill" notification system. Patient may have prescription filled one day early if pharmacy is closed on scheduled refill date.    Order Specific Question:   Supervising Provider    Answer:   Milinda Pointer [phone]  . oxyCODONE (OXY IR/ROXICODONE) 5 MG immediate release tablet    Sig: Take 1 tablet (5 mg total) by mouth 2 (two) times daily.    Dispense:  60 tablet    Refill:  0    Do not place this medication, or any other prescription from our practice, on "Automatic Refill". Patient may have prescription filled one day early if pharmacy is closed on scheduled refill date.    Order Specific Question:   Supervising Provider    Answer:   Milinda Pointer [phone]  . oxyCODONE (OXY IR/ROXICODONE) 5 MG immediate release tablet    Sig: Take 1 tablet (5 mg total) by mouth 2 (two) times daily.    Dispense:  60 tablet    Refill:  0    Do not place this medication, or any other prescription from our practice, on "Automatic Refill". Patient may have prescription filled one day early if pharmacy is closed on scheduled refill date.    Order Specific Question:   Supervising Provider    Answer:   Milinda Pointer [phone]  . oxyCODONE (OXY IR/ROXICODONE) 5 MG immediate release tablet    Sig: Take 1 tablet (5 mg total) by mouth 2 (two) times daily.    Dispense:  60 tablet    Refill:  0    Do not place this medication, or any other prescription from our practice, on "Automatic Refill". Patient may have  prescription filled one day early if pharmacy is closed on scheduled refill date.    Order Specific Question:   Supervising Provider    Answer:   Milinda Pointer [[id number]]   New Prescriptions   No medications on file   Medications administered today: Tanya [name] had no medications administered during this visit. Lab-work, procedure(s), and/or referral(s): Orders Placed This Encounter  Procedures  . ToxASSURE Select 13 (MW), Urine   Imaging and/or referral(s): None Interventional management options: Planned, scheduled, and/or pending:   Not at this time.    Considering:   Diagnostic bilateral intra-articular shoulder joint injection Diagnostic bilateral  suprascapular nerve block Possible bilateral suprascapular nerve RFA Right-sided cervical epidural steroid injection(Series #2)  Diagnostic bilateral cervical facet block Possible bilateral cervical facet RFA  Diagnostic bilateral lumbar facet block Possible bilateral lumbar facet RFA     Palliative PRN treatment(s):   Palliative right-sided cervical ESI(Series #2) Palliative right-sided cervical facet RFA#2    Provider-requested follow-up: Return in about 3 months (around 01/27/2018) for Appointment As Scheduled, in addition, MedMgmt.  Future Appointments  Date Time Provider [place]  12/04/2017  9:15 AM Vevelyn Francois, NP [facility] None  02/04/2018  9:30 AM Vevelyn Francois, NP [facility] None   Primary Care Physician: Ricardo Jericho, NP Location: [facility] Outpatient Pain Management Facility Note by: Vevelyn Francois NP Date: 11/06/2017; Time: 4:01 PM  Pain Score Disclaimer: We use the NRS-11 scale. This is a self-reported, subjective measurement of pain severity with only modest accuracy. It is used primarily to identify changes within a particular patient. It must be understood that outpatient pain scales are significantly less accurate that those used for research, where they can be  applied under ideal controlled circumstances with minimal exposure to variables. In reality, the score is likely to be a combination of pain intensity and pain affect, where pain affect describes the degree of emotional arousal or changes in action readiness caused by the sensory experience of pain. Factors such as social and work situation, setting, emotional state, anxiety levels, expectation, and prior pain experience may influence pain perception and show large inter-individual differences that may also be affected by time variables.  Patient instructions provided during this appointment: Patient Instructions  ____________________________________________________________________________________________  Medication Rules  Applies to: All patients receiving prescriptions (written or electronic).  Pharmacy of record: Pharmacy where electronic prescriptions will be sent. If written prescriptions are taken to a different pharmacy, please inform the nursing staff. The pharmacy listed in the electronic medical record should be the one where you would like electronic prescriptions to be sent.  Prescription refills: Only during scheduled appointments. Applies to both, written and electronic prescriptions.  NOTE: The following applies primarily to controlled substances (Opioid* Pain Medications).   Patient's responsibilities: 1. Pain Pills: Bring all pain pills to every appointment (except for procedure appointments). 2. Pill Bottles: Bring pills in original pharmacy bottle. Always bring newest bottle. Bring bottle, even if empty. 3. Medication refills: You are responsible for knowing and keeping track of what medications you need refilled. The day before your appointment, write a list of all prescriptions that need to be refilled. Bring that list to your appointment and give it to the admitting nurse. Prescriptions will be written only during appointments. If you forget a medication, it will not be  "Called in", "Faxed", or "electronically sent". You will need to get another appointment to get these prescribed. 4. Prescription Accuracy: You are responsible for carefully inspecting your prescriptions before leaving our office. Have the discharge nurse carefully go over each prescription with you, before taking them home. Make sure that your name is accurately spelled, that your address is correct. Check the name and dose of your medication to make sure it is accurate. Check the number of pills, and the written instructions to make sure they are clear and accurate. Make sure that you are given enough medication to last until your next medication refill appointment. 5. Taking Medication: Take medication as prescribed. Never take more pills than instructed. Never take medication more frequently than prescribed. Taking less pills or less frequently is permitted and encouraged, when  it comes to controlled substances (written prescriptions).  6. Inform other Doctors: Always inform, all of your healthcare providers, of all the medications you take. 7. Pain Medication from other Providers: You are not allowed to accept any additional pain medication from any other Doctor or Healthcare provider. There are two exceptions to this rule. (see below) In the event that you require additional pain medication, you are responsible for notifying us, as stated below. 8. Medication Agreement: You are responsible for carefully reading and following our Medication Agreement. This must be signed before receiving any prescriptions from our practice. Safely store a copy of your signed Agreement. Violations to the Agreement will result in no further prescriptions. (Additional copies of our Medication Agreement are available upon request.) 9. Laws, Rules, & Regulations: All patients are expected to follow all Federal and [employer], [employer], Rules, [employer]. Ignorance of the Laws does not constitute a valid excuse. The use of  any illegal substances is prohibited. 10. Adopted CDC guidelines & recommendations: Target dosing levels will be at or below 60 MME/day. Use of benzodiazepines** is not recommended.  Exceptions: There are only two exceptions to the rule of not receiving pain medications from other Healthcare Providers. 1. Exception #1 (Emergencies): In the event of an emergency (i.e.: accident requiring emergency care), you are allowed to receive additional pain medication. However, you are responsible for: As soon as you are able, call our office [phone], at any time of the day or night, and leave a message stating your name, the date and nature of the emergency, and the name and dose of the medication prescribed. In the event that your call is answered by a member of our staff, make sure to document and save the date, time, and the name of the person that took your information.  2. Exception #2 (Planned Surgery): In the event that you are scheduled by another doctor or dentist to have any type of surgery or procedure, you are allowed (for a period no longer than 30 days), to receive additional pain medication, for the acute post-op pain. However, in this case, you are responsible for picking up a copy of our "Post-op Pain Management for Surgeons" handout, and giving it to your surgeon or dentist. This document is available at our office, and does not require an appointment to obtain it. Simply go to our office during business hours (Monday-Thursday from 8:00 AM to 4:00 PM) (Friday 8:00 AM to 12:00 Noon) or if you have a scheduled appointment with [place], prior to your surgery, and ask for it by name. In addition, you will need to provide [place] with your name, name of your surgeon, type of surgery, and date of procedure or surgery.  *Opioid medications include: morphine, codeine, oxycodone, oxymorphone, hydrocodone, hydromorphone, meperidine, tramadol, tapentadol, buprenorphine, fentanyl, methadone. **Benzodiazepine  medications include: diazepam (Valium), alprazolam (Xanax), clonazepam (Klonopine), lorazepam (Ativan), clorazepate (Tranxene), chlordiazepoxide (Librium), estazolam (Prosom), oxazepam (Serax), temazepam (Restoril), triazolam (Halcion) (Last updated: 03/20/2017) ____________________________________________________________________________________________   ALL prescriptions were e-scribed to your pharmacy of choice.

## 2017-11-12 LAB — TOXASSURE SELECT 13 (MW), URINE

## 2017-12-04 ENCOUNTER — Ambulatory Visit: Payer: Medicaid Other | Attending: Nurse Practitioner | Admitting: Nurse Practitioner

## 2017-12-04 ENCOUNTER — Other Ambulatory Visit: Payer: Self-pay

## 2017-12-04 ENCOUNTER — Encounter: Payer: Self-pay | Admitting: Nurse Practitioner

## 2017-12-04 VITALS — BP 115/80 | HR 72 | Temp 97.9°F | Resp 18 | Ht 63.0 in | Wt 145.0 lb

## 2017-12-04 DIAGNOSIS — Z79899 Other long term (current) drug therapy: Secondary | ICD-10-CM | POA: Diagnosis not present

## 2017-12-04 DIAGNOSIS — Z79891 Long term (current) use of opiate analgesic: Secondary | ICD-10-CM | POA: Diagnosis not present

## 2017-12-04 DIAGNOSIS — Z90710 Acquired absence of both cervix and uterus: Secondary | ICD-10-CM | POA: Diagnosis not present

## 2017-12-04 DIAGNOSIS — Z8249 Family history of ischemic heart disease and other diseases of the circulatory system: Secondary | ICD-10-CM | POA: Insufficient documentation

## 2017-12-04 DIAGNOSIS — Z833 Family history of diabetes mellitus: Secondary | ICD-10-CM | POA: Diagnosis not present

## 2017-12-04 DIAGNOSIS — Z888 Allergy status to other drugs, medicaments and biological substances status: Secondary | ICD-10-CM | POA: Diagnosis not present

## 2017-12-04 DIAGNOSIS — M47812 Spondylosis without myelopathy or radiculopathy, cervical region: Secondary | ICD-10-CM | POA: Diagnosis not present

## 2017-12-04 DIAGNOSIS — Z823 Family history of stroke: Secondary | ICD-10-CM | POA: Insufficient documentation

## 2017-12-04 DIAGNOSIS — Z87891 Personal history of nicotine dependence: Secondary | ICD-10-CM | POA: Insufficient documentation

## 2017-12-04 DIAGNOSIS — G894 Chronic pain syndrome: Secondary | ICD-10-CM

## 2017-12-04 DIAGNOSIS — M4722 Other spondylosis with radiculopathy, cervical region: Secondary | ICD-10-CM | POA: Diagnosis not present

## 2017-12-04 DIAGNOSIS — K5792 Diverticulitis of intestine, part unspecified, without perforation or abscess without bleeding: Secondary | ICD-10-CM | POA: Diagnosis not present

## 2017-12-04 DIAGNOSIS — Z9889 Other specified postprocedural states: Secondary | ICD-10-CM | POA: Diagnosis not present

## 2017-12-04 DIAGNOSIS — Z5181 Encounter for therapeutic drug level monitoring: Secondary | ICD-10-CM | POA: Insufficient documentation

## 2017-12-04 NOTE — Progress Notes (Signed)
Patient's Name: Tanya [name]  MRN: [id number]  Referring Provider: Ricardo Jericho*  DOB: 10/31/1960  PCP: Ricardo Jericho, NP  DOS: 12/04/2017  Note by: Vevelyn Francois NP  Service setting: Ambulatory outpatient  Specialty: Interventional Pain Management  Location: [facility] (AMB) Pain Management Facility    Patient type: Established    Primary Reason(s) for Visit: Encounter for prescription drug management & post-procedure evaluation of chronic illness with mild to moderate exacerbation(Level of risk: moderate) CC: Pain (left side of head since cervical RFA)  HPI  Tanya [name] is a 57 y.o. year old, female patient, who comes today for a post-procedure evaluation and medication management. She has Anxiety; Chronic upper back pain (Secondary source of pain) (Bilateral) (R>L); Chronic discoid lupus erythematosus; Chronic low back pain [facility] source of pain) (Bilateral) (R>L); Chronic neck pain (Primary Source of Pain) (Bilateral) (R>L); Discoid lupus; Diverticulitis; Encounter for long-term (current) use of high-risk medication; Fibromyalgia; Generalized anxiety disorder; Hypercholesterolemia; Hyperlipidemia; Major depressive disorder, recurrent ([place]); Pain medication agreement signed; Osteoarthritis; SLE (systemic lupus erythematosus) ([place]); Urinary incontinence; Long term prescription benzodiazepine use; Long term current use of opiate analgesic; Long term prescription opiate use; Opiate use (10 MME/Day); Chronic pain syndrome; Chronic upper extremity pain (Right); Chronic cervical radicular pain (Right); Chronic shoulder pain (Bilateral) (R>L); 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 Her primarily concern today is the Pain (left side of head since cervical RFA)  Pain Assessment: Location: Left Head Radiating: hairline Onset: More than a month ago Duration: Chronic pain Quality: Burning Severity: 5 /10 (subjective, self-reported pain score)  Note: Reported level is compatible with observation.                          Effect on ADL:   Timing: Constant Modifying factors: medications, ice  BP: 115/80  HR: 72  [name] was last seen on 11/06/2017 for a procedure. During today's appointment we reviewed Tanya [name]'s post-procedure results Further details on both, my assessment(s), as well as the proposed treatment plan, please see below.  Post-Procedure Assessment  11/12/2017 Procedure: Left paraspinal cervical facet radiofrequency ablation Pre-procedure pain score:  4/10 Post-procedure pain score: 0/10         Influential Factors: BMI: 25.69 kg/m Intra-procedural challenges: None observed.         Assessment challenges: None detected.              Reported side-effects: None.        Post-procedural adverse reactions or complications: None reported         Sedation: Please see nurses note. When no sedatives are used, the analgesic levels obtained are directly associated to the effectiveness of the local anesthetics. However, when sedation is provided, the level of analgesia obtained during the initial 1 hour following the intervention, is believed to be the result of a combination of factors. These factors may include, but are not limited to: 1. The effectiveness of the local anesthetics used. 2. The effects of the analgesic(s) and/or anxiolytic(s) used. 3. The degree of discomfort experienced by the patient at the time of the procedure. 4. The patients ability and reliability in recalling and recording the events. 5. The presence and influence of possible  secondary gains  and/or psychosocial factors. Reported result: Relief experienced during the 1st hour after the procedure: 100 % (Ultra-Short Term Relief)            Interpretative annotation: Clinically appropriate result. Analgesia during this period is likely to be Local Anesthetic and/or IV Sedative (Analgesic/Anxiolytic) related.          Effects of local anesthetic: The analgesic effects attained during this period are directly associated to the localized infiltration of local anesthetics and therefore cary significant diagnostic value as to the etiological location, or anatomical origin, of the pain. Expected duration of relief is directly dependent on the pharmacodynamics of the local anesthetic used. Long-acting (4-6 hours) anesthetics used.  Reported result: Relief during the next 4 to 6 hour after the procedure: 100 % (Short-Term Relief)            Interpretative annotation: Clinically appropriate result. Analgesia during this period is likely to be Local Anesthetic-related.          Long-term benefit: Defined as the period of time past the expected duration of local anesthetics (1 hour for short-acting and 4-6 hours for long-acting). With the possible exception of prolonged sympathetic blockade from the local anesthetics, benefits during this period are typically attributed to, or associated with, other factors such as analgesic sensory neuropraxia, antiinflammatory effects, or beneficial biochemical changes provided by agents other than the local anesthetics.  Reported result: Extended relief following procedure: 100 %(numb) (Long-Term Relief)            Interpretative annotation: Clinically possible results. Good relief. No permanent benefit expected. Inflammation plays a part in the etiology to the pain.          Current benefits: Defined as reported results that persistent at this point in time.   Analgesia: 50 % [name] reports improvement of axial symptoms.She admits that she is having a lot of  numbness going in the left side of her head.  He admits that this numbness goes all the way up to the top of her head.  She denies any blurred vision dizziness changes in her's the each or any red flags. Function: Ms. Massengale reports improvement in function ROM: Ms. Devall reports improvement in ROM Interpretative annotation: Recurrence of symptoms.       Benefit could signal adequate RF ablation.  Interpretation: Results would suggest adequate radiofrequency ablation.                  Plan:  Please see "Plan of Care" for details.                Laboratory Chemistry  Inflammation Markers (CRP: Acute Phase) (ESR: Chronic Phase) Lab Results  Component Value Date   CRP 2 09/03/2017   ESRSEDRATE 21 09/03/2017                         Rheumatology Markers No results found for: RF, ANA, LABURIC, URICUR, LYMEIGGIGMAB, LYMEABIGMQN, HLAB27                      Renal Function Markers Lab Results  Component Value Date   BUN 10 09/03/2017   CREATININE 0.67 09/03/2017   BCR 15 09/03/2017   GFRAA 114 09/03/2017   GFRNONAA 99 09/03/2017                             Hepatic Function Markers Lab  Results  Component Value Date   AST 15 09/03/2017   ALT 16 09/08/2016   ALBUMIN 4.4 09/03/2017   ALKPHOS 30 (L) 09/03/2017   LIPASE 37 09/08/2016                        Electrolytes Lab Results  Component Value Date   NA 137 09/03/2017   K 4.2 09/03/2017   CL 101 09/03/2017   CALCIUM 9.4 09/03/2017   MG 1.9 09/03/2017                        Neuropathy Markers Lab Results  Component Value Date   VITAMINB12 296 09/03/2017                        CNS Tests No results found for: COLORCSF, APPEARCSF, RBCCOUNTCSF, WBCCSF, POLYSCSF, LYMPHSCSF, EOSCSF, PROTEINCSF, GLUCCSF, JCVIRUS, CSFOLI, IGGCSF                      Bone Pathology Markers Lab Results  Component Value Date   25OHVITD1 25 (L) 09/03/2017   25OHVITD2 2.9 09/03/2017   25OHVITD3 22 09/03/2017                          Coagulation Parameters Lab Results  Component Value Date   PLT 375 09/08/2016                        Cardiovascular Markers Lab Results  Component Value Date   HGB 13.4 09/08/2016   HCT 40.0 09/08/2016                         CA Markers No results found for: CEA, CA125, LABCA2                      Note: Lab results reviewed.  Recent Diagnostic Imaging Results  DG C-Arm 1-60 Min-No Report Fluoroscopy was utilized by the requesting physician.  No radiographic  interpretation.   Complexity Note: Imaging results reviewed. Results shared with Ms. Thalman, using Layman's terms.                         Meds   Current Outpatient Medications:  .  albuterol (PROVENTIL HFA;VENTOLIN HFA) 108 (90 Base) MCG/ACT inhaler, Inhale 1-2 puffs into the lungs every 6 (six) hours as needed for wheezing or shortness of breath., Disp: 1 Inhaler, Rfl: 0 .  cyclobenzaprine (FLEXERIL) 10 MG tablet, Take 1 tablet (10 mg total) by mouth 3 (three) times daily as needed for muscle spasms., Disp: 90 tablet, Rfl: 2 .  DULoxetine (CYMBALTA) 30 MG capsule, TAKE 1 CAPSULE(30 MG) BY MOUTH EVERY DAY, Disp: , Rfl:  .  DULoxetine (CYMBALTA) 30 MG capsule, Take 30 mg by mouth daily., Disp: , Rfl:  .  gabapentin (NEURONTIN) 300 MG capsule, Take 1 capsule (300 mg total) by mouth 3 (three) times daily., Disp: 90 capsule, Rfl: 2 .  hydroxychloroquine (PLAQUENIL) 200 MG tablet, Take by mouth., Disp: , Rfl:  .  nicotine (NICODERM CQ - DOSED IN MG/24 HOURS) 21 mg/24hr patch, Place 21 mg onto the skin daily., Disp: , Rfl:  .  [START ON 01/05/2018] oxyCODONE (OXY IR/ROXICODONE) 5 MG immediate release tablet, Take 1 tablet (5 mg total) by mouth 2 (two) times  daily., Disp: 60 tablet, Rfl: 0 .  [START ON 12/06/2017] oxyCODONE (OXY IR/ROXICODONE) 5 MG immediate release tablet, Take 1 tablet (5 mg total) by mouth 2 (two) times daily., Disp: 60 tablet, Rfl: 0 .  oxyCODONE (OXY IR/ROXICODONE) 5 MG immediate release tablet, Take  1 tablet (5 mg total) by mouth 2 (two) times daily., Disp: 60 tablet, Rfl: 0 .  pantoprazole (PROTONIX) 40 MG tablet, Take 40 mg by mouth daily., Disp: , Rfl:   ROS  Constitutional: Denies any fever or chills Gastrointestinal: No reported hemesis, hematochezia, vomiting, or acute GI distress Musculoskeletal: Denies any acute onset joint swelling, redness, loss of ROM, or weakness Neurological: No reported episodes of acute onset apraxia, aphasia, dysarthria, agnosia, amnesia, paralysis, loss of coordination, or loss of consciousness  Allergies  Ms. Mensch is allergic to trazodone; bupropion; methocarbamol; oxycodone-acetaminophen; tramadol; and trazodone and nefazodone.  [place]  Drug: Ms. Bernabe  reports that she does not use drugs. Alcohol:  reports that she does not drink alcohol. Tobacco:  reports that she quit smoking about 5 months ago. Her smoking use included cigarettes. She has a 35.00 pack-year smoking history. She has never used smokeless tobacco. Medical:  has a past medical history of Anxiety, Bladder infection, Chronic lower back pain, Chronic neck pain, Depression, Diverticulitis, Hyperlipidemia, Lupus ([place]), and Overactive bladder. Surgical: Ms. Beckers  has a past surgical history that includes Abdominal hysterectomy; Colonoscopy; Colonoscopy with propofol (N/A, 07/03/2017); Tonsillectomy; Esophagogastroduodenoscopy (N/A, 10/21/2017); and Colonoscopy with propofol (N/A, 10/21/2017). Family: family history includes Cancer in her mother; Diabetes in her brother; Emphysema in her mother; Glaucoma in her father; Heart disease in her father; Hypertension in her mother; Stroke in her mother.  Constitutional Exam  General appearance: Well nourished, well developed, and well hydrated. In no apparent acute distress Vitals:   12/04/17 1004  BP: 115/80  Pulse: 72  Resp: 18  Temp: 97.9 F (36.6 C)  TempSrc: Oral  SpO2: 98%  Weight: 145 lb (65.8 kg)  Height: _0  (1.6 m)   Psych/Mental status: Alert, oriented x 3 (person, place, & time)       Eyes: PERLA Respiratory: No evidence of acute respiratory distress  Cervical Spine Area Exam  Skin & Axial Inspection: No masses, redness, edema, swelling, or associated skin lesions Alignment: Symmetrical Functional ROM: Unrestricted ROM      Stability: No instability detected Muscle Tone/Strength: Functionally intact. No obvious neuro-muscular anomalies detected. Sensory (Neurological): Unimpaired Palpation: Tender              Upper Extremity (UE) Exam    Side: Right upper extremity  Side: Left upper extremity  Skin & Extremity Inspection: Skin color, temperature, and hair growth are WNL. No peripheral edema or cyanosis. No masses, redness, swelling, asymmetry, or associated skin lesions. No contractures.  Skin & Extremity Inspection: Skin color, temperature, and hair growth are WNL. No peripheral edema or cyanosis. No masses, redness, swelling, asymmetry, or associated skin lesions. No contractures.  Functional ROM: Unrestricted ROM          Functional ROM: Unrestricted ROM          Muscle Tone/Strength: Functionally intact. No obvious neuro-muscular anomalies detected.  Muscle Tone/Strength: Functionally intact. No obvious neuro-muscular anomalies detected.  Sensory (Neurological): Unimpaired          Sensory (Neurological): Unimpaired          Palpation: No palpable anomalies              Palpation: No palpable anomalies  Provocative Test(s):  Phalen's test: deferred Tinel's test: deferred Apley's scratch test (touch opposite shoulder):  Action 1 (Across chest): deferred Action 2 (Overhead): deferred Action 3 (LB reach): deferred   Provocative Test(s):  Phalen's test: deferred Tinel's test: deferred Apley's scratch test (touch opposite shoulder):  Action 1 (Across chest): deferred Action 2 (Overhead): deferred Action 3 (LB reach): deferred    Thoracic Spine Area Exam  Skin & Axial  Inspection: No masses, redness, or swelling Alignment: Symmetrical Functional ROM: Unrestricted ROM Stability: No instability detected Muscle Tone/Strength: Functionally intact. No obvious neuro-muscular anomalies detected. Sensory (Neurological): Unimpaired Muscle strength & Tone: No palpable anomalies  Gait & Posture Assessment  Ambulation: Unassisted Gait: Relatively normal for age and body habitus Posture: WNL    Assessment  Primary Diagnosis & Pertinent Problem List: The primary encounter diagnosis was Cervical spondylosis(L>R). Diagnoses of Cervical facet syndrome (Bilateral) (R>L), Chronic pain syndrome, and Long term current use of opiate analgesic were also pertinent to this visit.  Status Diagnosis  Controlled Controlled Controlled 1. Cervical spondylosis(L>R)   2. Cervical facet syndrome (Bilateral) (R>L)   3. Chronic pain syndrome   4. Long term current use of opiate analgesic     Problems updated and reviewed during this visit: No problems updated. Plan of Care  Pharmacotherapy (Medications Ordered): No orders of the defined types were placed in this encounter.  New Prescriptions   No medications on file   Medications administered today: Barbaraann L. Zaccone had no medications administered during this visit. Lab-work, procedure(s), and/or referral(s): No orders of the defined types were placed in this encounter.  Imaging and/or referral(s): None  Interventional management options: Planned, scheduled, and/or pending: Not at this time. Increase the Gabapentin to QID and then follow up with a phone call in one month if not improved to see Dr Lowella Dandy   Considering: Diagnostic bilateral intra-articular shoulder joint injection Diagnostic bilateral suprascapular nerve block Possible bilateral suprascapular nerve RFA Right-sided cervical epidural steroid injection(Series #2) Diagnostic bilateral cervical facet block Possible bilateral cervical  facetRFA Diagnostic bilateral lumbar facet block Possible bilateral lumbar facetRFA   Palliative PRN treatment(s): Palliative right-sided cervicalESI(Series #2) Palliative right-sided cervical facet RFA#2   Provider-requested follow-up: Return for Appointment As Scheduled.  Future Appointments  Date Time Provider [place]  02/04/2018  9:30 AM Vevelyn Francois, NP [facility] None   Primary Care Physician: Ricardo Jericho, NP Location: [facility] Out[facility] Management Facility Note by: Vevelyn Francois NP Date: 12/04/2017; Time: 1:15 PM  Pain Score Disclaimer: We use the NRS-11 scale. This is a self-reported, subjective measurement of pain severity with only modest accuracy. It is used primarily to identify changes within a particular patient. It must be understood that outpatient pain scales are significantly less accurate that those used for research, where they can be applied under ideal controlled circumstances with minimal exposure to variables. In reality, the score is likely to be a combination of pain intensity and pain affect, where pain affect describes the degree of emotional arousal or changes in action readiness caused by the sensory experience of pain. Factors such as social and work situation, setting, emotional state, anxiety levels, expectation, and prior pain experience may influence pain perception and show large inter-individual differences that may also be affected by time variables.  Patient instructions provided during this appointment: There are no Patient Instructions on file for this visit.

## 2017-12-04 NOTE — Progress Notes (Signed)
Safety precautions to be maintained throughout the outpatient stay will include: orient to surroundings, keep bed in low position, maintain call bell within reach at all times, provide assistance with transfer out of bed and ambulation.  

## 2017-12-10 ENCOUNTER — Encounter: Payer: Self-pay | Admitting: Pain Medicine

## 2017-12-10 ENCOUNTER — Other Ambulatory Visit: Payer: Self-pay

## 2017-12-10 ENCOUNTER — Ambulatory Visit: Payer: Medicaid Other | Attending: Pain Medicine | Admitting: Pain Medicine

## 2017-12-10 VITALS — BP 140/75 | HR 102 | Temp 97.4°F | Resp 18 | Ht 63.0 in | Wt 145.0 lb

## 2017-12-10 DIAGNOSIS — R21 Rash and other nonspecific skin eruption: Secondary | ICD-10-CM | POA: Diagnosis not present

## 2017-12-10 DIAGNOSIS — E559 Vitamin D deficiency, unspecified: Secondary | ICD-10-CM | POA: Diagnosis not present

## 2017-12-10 DIAGNOSIS — M797 Fibromyalgia: Secondary | ICD-10-CM | POA: Insufficient documentation

## 2017-12-10 DIAGNOSIS — R51 Headache: Secondary | ICD-10-CM

## 2017-12-10 DIAGNOSIS — N3281 Overactive bladder: Secondary | ICD-10-CM | POA: Diagnosis not present

## 2017-12-10 DIAGNOSIS — M546 Pain in thoracic spine: Secondary | ICD-10-CM | POA: Diagnosis not present

## 2017-12-10 DIAGNOSIS — M5481 Occipital neuralgia: Secondary | ICD-10-CM | POA: Insufficient documentation

## 2017-12-10 DIAGNOSIS — M25511 Pain in right shoulder: Secondary | ICD-10-CM | POA: Diagnosis not present

## 2017-12-10 DIAGNOSIS — Z8249 Family history of ischemic heart disease and other diseases of the circulatory system: Secondary | ICD-10-CM | POA: Insufficient documentation

## 2017-12-10 DIAGNOSIS — M25512 Pain in left shoulder: Secondary | ICD-10-CM | POA: Diagnosis not present

## 2017-12-10 DIAGNOSIS — F17210 Nicotine dependence, cigarettes, uncomplicated: Secondary | ICD-10-CM | POA: Insufficient documentation

## 2017-12-10 DIAGNOSIS — M5011 Cervical disc disorder with radiculopathy,  high cervical region: Secondary | ICD-10-CM | POA: Diagnosis not present

## 2017-12-10 DIAGNOSIS — M5124 Other intervertebral disc displacement, thoracic region: Secondary | ICD-10-CM | POA: Insufficient documentation

## 2017-12-10 DIAGNOSIS — Z79891 Long term (current) use of opiate analgesic: Secondary | ICD-10-CM | POA: Insufficient documentation

## 2017-12-10 DIAGNOSIS — E785 Hyperlipidemia, unspecified: Secondary | ICD-10-CM | POA: Insufficient documentation

## 2017-12-10 DIAGNOSIS — E7800 Pure hypercholesterolemia, unspecified: Secondary | ICD-10-CM | POA: Diagnosis not present

## 2017-12-10 DIAGNOSIS — M542 Cervicalgia: Secondary | ICD-10-CM | POA: Diagnosis not present

## 2017-12-10 DIAGNOSIS — G894 Chronic pain syndrome: Secondary | ICD-10-CM | POA: Insufficient documentation

## 2017-12-10 DIAGNOSIS — M792 Neuralgia and neuritis, unspecified: Secondary | ICD-10-CM

## 2017-12-10 DIAGNOSIS — M329 Systemic lupus erythematosus, unspecified: Secondary | ICD-10-CM | POA: Diagnosis not present

## 2017-12-10 DIAGNOSIS — M4722 Other spondylosis with radiculopathy, cervical region: Secondary | ICD-10-CM | POA: Diagnosis not present

## 2017-12-10 DIAGNOSIS — F411 Generalized anxiety disorder: Secondary | ICD-10-CM | POA: Insufficient documentation

## 2017-12-10 DIAGNOSIS — R519 Headache, unspecified: Secondary | ICD-10-CM

## 2017-12-10 DIAGNOSIS — M47812 Spondylosis without myelopathy or radiculopathy, cervical region: Secondary | ICD-10-CM

## 2017-12-10 DIAGNOSIS — Z79899 Other long term (current) drug therapy: Secondary | ICD-10-CM | POA: Diagnosis not present

## 2017-12-10 NOTE — Patient Instructions (Addendum)
____________________________________________________________________________________________  Preparing for Procedure with Sedation  Instructions: . Oral Intake: Do not eat or drink anything for at least 8 hours prior to your procedure. . Transportation: Public transportation is not allowed. Bring an adult driver. The driver must be physically present in our waiting room before any procedure can be started. . Physical Assistance: Bring an adult physically capable of assisting you, in the event you need help. This adult should keep you company at home for at least 6 hours after the procedure. . Blood Pressure Medicine: Take your blood pressure medicine with a sip of water the morning of the procedure. . Blood thinners: Notify our staff if you are taking any blood thinners. Depending on which one you take, there will be specific instructions on how and when to stop it. . Diabetics on insulin: Notify the staff so that you can be scheduled 1st case in the morning. If your diabetes requires high dose insulin, take only  of your normal insulin dose the morning of the procedure and notify the staff that you have done so. . Preventing infections: Shower with an antibacterial soap the morning of your procedure. . Build-up your immune system: Take 1000 mg of Vitamin C with every meal (3 times a day) the day prior to your procedure. . Antibiotics: Inform the staff if you have a condition or reason that requires you to take antibiotics before dental procedures. . Pregnancy: If you are pregnant, call and cancel the procedure. . Sickness: If you have a cold, fever, or any active infections, call and cancel the procedure. . Arrival: You must be in the facility at least 30 minutes prior to your scheduled procedure. . Children: Do not bring children with you. . Dress appropriately: Bring dark clothing that you would not mind if they get stained. . Valuables: Do not bring any jewelry or valuables.  Procedure  appointments are reserved for interventional treatments only. . No Prescription Refills. . No medication changes will be discussed during procedure appointments. . No disability issues will be discussed.  Reasons to call and reschedule or cancel your procedure: (Following these recommendations will minimize the risk of a serious complication.) . Surgeries: Avoid having procedures within 2 weeks of any surgery. (Avoid for 2 weeks before or after any surgery). . Flu Shots: Avoid having procedures within 2 weeks of a flu shots or . (Avoid for 2 weeks before or after immunizations). . Barium: Avoid having a procedure within 7-10 days after having had a radiological study involving the use of radiological contrast. (Myelograms, Barium swallow or enema study). . Heart attacks: Avoid any elective procedures or surgeries for the initial 6 months after a "Myocardial Infarction" (Heart Attack). . Blood thinners: It is imperative that you stop these medications before procedures. Let us know if you if you take any blood thinner.  . Infection: Avoid procedures during or within two weeks of an infection (including chest colds or gastrointestinal problems). Symptoms associated with infections include: Localized redness, fever, chills, night sweats or profuse sweating, burning sensation when voiding, cough, congestion, stuffiness, runny nose, sore throat, diarrhea, nausea, vomiting, cold or Flu symptoms, recent or current infections. It is specially important if the infection is over the area that we intend to treat. . Heart and lung problems: Symptoms that may suggest an active cardiopulmonary problem include: cough, chest pain, breathing difficulties or shortness of breath, dizziness, ankle swelling, uncontrolled high or unusually low blood pressure, and/or palpitations. If you are experiencing any of these symptoms, cancel   your procedure and contact your primary care physician for an evaluation.  Remember:   Regular Business hours are:  Monday to Thursday 8:00 AM to 4:00 PM  Provider's Schedule: Milinda Pointer, MD:  Procedure days: Tuesday and Thursday 7:30 AM to 4:00 PM  Gillis Santa, MD:  Procedure days: Monday and Wednesday 7:30 AM to 4:00 PM ____________________________________________________________________________________________   ____________________________________________________________________________________________  Blood Thinners  Recommended Time Interval Before and After Neuraxial Block or Catheter Removal  Drug (Generic) Brand Name Time Before Time After Comments  Abciximab Reopro 15 days 2 hours   Alteplase Activase 10 days 10 days   Apixaban Eliquis 3 days 6 hours   Aspirin > 325 mg Goody Powders/Excedrin 11 days  (Usually not stopped)  Aspirin ? 81 mg  7 days  (Usually not stopped)  Cholesterol Medication Lipitor 4 days    Cilostazol Pletal 3 days 5 hours   Clopidogrel Plavix 7-10 days 2 hours   Dabigatran Pradaxa 5 days 6 hours   Delteparin Fragmin 24 hours 4 hours   Dipyridamole + ASA Aggrenox 11days 2 hours   Enoxaparin  Lovenox 24 hours 4 hours   Eptifibatide Integrillin 8 hours 2 hours   Fish oil  4 days    Fondaparinux  Arixtra 72 hours 12 hours   Garlic supplements  7 days    Ginkgo biloba  36 hours    Ginseng  24 hours    Heparin (IV)  4 hours 2 hours   Heparin ([place])  12 hours 2 hours   Hydroxychloroquine Plaquenil 11 days    LMW Heparin  24 hours    LMWH  24 hours    NSAIDs  3 days  (Usually not stopped)  Prasugrel Effient 7-10 days 6 hours   Reteplase Retavase 10 days 10 days   Rivaroxaban Xarelto 3 days 6 hours   Streptokinase Streptase 10 days 10 days   Tenecteplase TNKase 10 days 10 days   Thrombolytics  10 days  10 days Avoid x 10 days after inj.  Ticagrelor Brilinta 5-7 days 6 hours   Ticlodipine Ticlid 10-14 days 2 hours   Tinzaparin Innohep 24 hours 4 hours   Tirofiban Aggrastat 8 hours 2 hours   Vitamin E  4 days    Warfarin  Coumadin 5 days 2 hours   ____________________________________________________________________________________________ Occipital Nerve Block Patient Information  Description: The occipital nerves originate in the cervical (neck) spinal cord and travel upward through muscle and tissue to supply sensation to the back of the head and top of the scalp.  In addition, the nerves control some of the muscles of the scalp.  Occipital neuralgia is an irritation of these nerves which can cause headaches, numbness of the scalp, and neck discomfort.     The occipital nerve block will interrupt nerve transmission through these nerves and can relieve pain and spasm.  The block consists of insertion of a small needle under the skin in the back of the head to deposit local anesthetic (numbing medicine) and/or steroids around the nerve.  The entire block usually lasts less than 5 minutes.  Conditions which may be treated by occipital blocks:   Muscular pain and spasm of the scalp  Nerve irritation, back of the head  Headaches  Upper neck pain  Preparation for the injection:  1. Do not eat any solid food or dairy products within 8 hours of your appointment. 2. You may drink clear liquids up to 3 hours before appointment.  Clear liquids include water,  black coffee, juice or soda.  No milk or cream please. 3. You may take your regular medication, including pain medications, with a sip of water before you appointment.  Diabetics should hold regular insulin (if taken separately) and take 1/2 normal NPH dose the morning of the procedure.  Carry some sugar containing items with you to your appointment. 4. A driver must accompany you and be prepared to drive you home after your procedure. 5. Bring all your current medications with you. 6. An IV may be inserted and sedation may be given at the discretion of the physician. 7. A blood pressure cuff, EKG, and other monitors will often be applied during the procedure.   Some patients may need to have extra oxygen administered for a short period. 8. You will be asked to provide medical information, including your allergies and medications, prior to the procedure.  We must know immediately if you are taking blood thinners (like Coumadin/Warfarin) or if you are allergic to IV iodine contrast (dye).  We must know if you could possible be pregnant.  9. Do not wear a high collared shirt or turtleneck.  Tie long hair up in the back if possible.  Possible side-effects:   Bleeding from needle site  Infection (rare, may require surgery)  Nerve injury (rare)  Hair on back of neck can be tinged with iodine scrub (this will wash out)  Light-headedness (temporary)  Pain at injection site (several days)  Decreased blood pressure (rare, temporary)  Seizure (very rare)  Call if you experience:   Hives or difficulty breathing ( go to the emergency room)  Inflammation or drainage at the injection site(s)  Please note:  Although the local anesthetic injected can often make your painful muscles or headache feel good for several hours after the injection, the pain may return.  It takes 3-7 days for steroids to work.  You may not notice any pain relief for at least one week.  If effective, we will often do a series of injections spaced 3-6 weeks apart to maximally decrease your pain.  If you have any questions, please call [phone] [facility]

## 2017-12-10 NOTE — Progress Notes (Signed)
Safety precautions to be maintained throughout the outpatient stay will include: orient to surroundings, keep bed in low position, maintain call bell within reach at all times, provide assistance with transfer out of bed and ambulation.  

## 2017-12-10 NOTE — Progress Notes (Signed)
Patient's Name: Tanya [name]  MRN: [id number]  Referring Provider: Ricardo Jericho*  DOB: November 09, 1960  PCP: Ricardo Jericho, NP  DOS: 12/10/2017  Note by: Gaspar Cola, MD  Service setting: Ambulatory outpatient  Specialty: Interventional Pain Management  Location: [facility] (AMB) Pain Management Facility    Patient type: Established   Primary Reason(s) for Visit: Evaluation of chronic illnesses with exacerbation, or progression (Level of risk: moderate) CC: Appointment and Headache  HPI  Tanya [name] is a 57 y.o. year old, female patient, who comes today for a follow-up evaluation. She has Anxiety; Chronic upper back pain (Secondary source of pain) (Bilateral) (R>L); Chronic discoid lupus erythematosus; Chronic low back pain [facility] source of pain) (Bilateral) (R>L); Chronic neck pain (Primary Source of Pain) (Bilateral) (R>L); Discoid lupus; Diverticulitis; Encounter for long-term (current) use of high-risk medication; Fibromyalgia; Generalized anxiety disorder; Hypercholesterolemia; Hyperlipidemia; Major depressive disorder, recurrent ([place]); Pain medication agreement signed; Osteoarthritis; SLE (systemic lupus erythematosus) ([place]); Urinary incontinence; Long term prescription benzodiazepine use; Long term current use of opiate analgesic; Long term prescription opiate use; Opiate use (10 MME/Day); Chronic pain syndrome; Chronic upper extremity pain (Right); Chronic cervical radicular pain (Right); Chronic shoulder pain (Bilateral) (R>L); 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 Tanya [name] was last seen on 10/23/2017. Her primarily concern today is the Appointment and Headache  Pain Assessment: Location: Posterior, Left Head Radiating: Denies  Onset: More than a month ago Duration: Chronic pain Quality: Burning, Aching, Constant, Sore("Shock") Severity: 4 /10 (subjective, self-reported pain score)  Note: Reported level is inconsistent with clinical observations. Clinically the patient looks like a 3/10 A 3/10 is viewed as "Moderate" and described as significantly interfering with activities of daily living (ADL). It becomes difficult to feed, bathe, get dressed, get on and off the toilet or to perform personal hygiene functions. Difficult to get in and out of bed or a chair without assistance. Very distracting. With effort, it can be ignored when deeply involved in activities.       When using our objective Pain Scale, levels between 6 and 10/10 are said to belong in an emergency room, as it progressively worsens from a 6/10, described as severely limiting, requiring emergency care not usually available at an outpatient pain management facility. At a 6/10 level, communication becomes difficult and requires great effort. Assistance to reach the emergency department may be required. Facial flushing and profuse sweating along with potentially dangerous increases in heart rate and blood pressure will be evident. Effect on ADL: "Just have to keep on going. I can;t lay on my left side anymore"  Timing: Constant Modifying factors: Denies  BP: 140/75  HR: (!) 102  The patient returns to the clinic today complaining of a headache in the occipital region, on the left side.  Some 10/23/2017 the patient had a left-sided cervical facet radiofrequency ablation under fluoroscopic guidance and IV sedation.  She indicates that the pain has been there since the radiofrequency.  She describes the pain as being in the area of the left  greater occipital nerve distribution.  She describes this pain as a burning sensation with hyperalgesia over the area of the skin covering the back of the head, especially  on the left side.  The pain is constant, but it gets aggravated at night when she lays her head down on the pillow.  She indicates that it is difficult for her to tolerate anything.  From the conversation that I had with the patient, I would seem that she was not taking her gabapentin correctly and she might have been taking it more on a PRN basis.  Today I have explained to the patient that in order for the medicine to work as it should, he should be taking regularly.  She understood and accepted and indicated that she would be doing that from now on.  I also recommended to her that she increase the dose at bedtime so asked to help her with this discomfort that she is experiencing when the pillow touches the back of her head.  Above all, the patient came in today wanting to know if this was normal.  I have explained to her that it is not, but it is possible.  I explained to the patient that in a small percentage of the radiofrequencies, you can have the patients develop a neuritis.  Whether the neuritis is secondary to the radiofrequency itself or swelling of the area around where the radiofrequency was done, remains to be seen.  I have explained to the patient that in most cases, if it is a neuritis, it is usually self-limiting and it should last for about the duration of the radiofrequency benefits.  Typically is treated with a membrane stabilizer such as gabapentin as well as steroids.  With regards to this last one, I have explained to the patient that I will be bringing her back to do an injection of local anesthetic and steroids around the area of the C3 and C2 medial branches and nerve root, for the purpose of treating any inflammatory component that may be present.  The other possibility associated with the area in particular where she  had her radiofrequency, is that she might have developed a trigger point in the area of the trapezius muscle, close to the area of the greater occipital nerve which in turn could put pressure over this nerve creating an occipital neuralgia secondary to the tension in the muscle.  This should also get better with an injection of local anesthetic and steroid into the area that greater occipital nerve.  The relationship between the 2 is that the occipital nerve originates in the area of the C3 medial branch and C2 nerve roots.  Even though we did not lesion the C2, the component from C3 may be sufficient to cause of some of the symptoms.  The plan at this point is to bring her back for a left-sided greater occipital nerve block with local anesthetic and steroids.  Patient has requested that this be done with sedation and I do not have any problems with that.  Other than this, the patient indicates that the pain that she had in the lower portion of her spine, is completely gone after the radiofrequency.  She is hopeful that this pain can be decreased so that she can enjoy similar results to those that she had with her first radiofrequency provided her with over a year of pain relief.  Further details on both, my assessment(s), as well as the proposed treatment plan, please see below.  Laboratory Chemistry  Inflammation Markers (CRP: Acute Phase) (ESR: Chronic Phase) Lab Results  Component Value Date   CRP 2 09/03/2017   ESRSEDRATE 21 09/03/2017  Rheumatology Markers No results found.  Renal Function Markers Lab Results  Component Value Date   BUN 10 09/03/2017   CREATININE 0.67 09/03/2017   BCR 15 09/03/2017   GFRAA 114 09/03/2017   GFRNONAA 99 09/03/2017                             Hepatic Function Markers Lab Results  Component Value Date   AST 15 09/03/2017   ALT 16 09/08/2016   ALBUMIN 4.4 09/03/2017   ALKPHOS 30 (L) 09/03/2017   LIPASE 37 09/08/2016                         Electrolytes Lab Results  Component Value Date   NA 137 09/03/2017   K 4.2 09/03/2017   CL 101 09/03/2017   CALCIUM 9.4 09/03/2017   MG 1.9 09/03/2017                        Neuropathy Markers Lab Results  Component Value Date   VITAMINB12 296 09/03/2017                        CNS Tests No results found.  Bone Pathology Markers Lab Results  Component Value Date   25OHVITD1 25 (L) 09/03/2017   25OHVITD2 2.9 09/03/2017   25OHVITD3 22 09/03/2017                         Coagulation Parameters Lab Results  Component Value Date   PLT 375 09/08/2016                        Cardiovascular Markers Lab Results  Component Value Date   HGB 13.4 09/08/2016   HCT 40.0 09/08/2016                         CA Markers No results found.  Note: Lab results reviewed.  Imaging Review  Cervical Imaging: Cervical MR wo contrast:  Results for orders placed during the hospital encounter of 09/20/15  MR Cervical Spine Wo Contrast   Narrative CLINICAL DATA:  Long history of neck and back pain.  EXAM: MRI CERVICAL, THORACIC  WITHOUT CONTRAST  TECHNIQUE: Multiplanar and multiecho pulse sequences of the cervical spine, to include the craniocervical junction and cervicothoracic junction and thoracic spine were obtained without intravenous contrast.  COMPARISON:  None.  FINDINGS: MRI CERVICAL SPINE FINDINGS  Examination is limited due to patient motion and poor image quality.  Alignment: Normal.  Vertebrae: Grossly normal.  Cord: Grossly normal.  Posterior Fossa, vertebral arteries, paraspinal tissues: No significant findings.  Disc levels:  C2-3:  No significant findings.  C3-4: Shallow central disc protrusion with mild mass effect on the ventral thecal sac. Mild narrowing of the ventral CSF space. Mild right foraminal narrowing due to uncinate spurring.  C4-5: Bulging annulus, osteophytic ridging and uncinate spurring with mass effect on the  ventral thecal sac, asymmetric right. Narrowing of the ventral CSF space. Moderate bilateral foraminal stenosis.  C5-6: Shallow central disc protrusion, osteophytic ridging and uncinate spurring with flattening of the ventral thecal sac and narrowing of the ventral CSF space. Moderate left and mild right foraminal stenosis.  C6-7: Broad-based disc protrusion asymmetrical left with mass effect on the thecal sac and narrowing of the ventral CSF  space. No significant foraminal stenosis.  C7-T1: No significant findings.  MRI THORACIC SPINE FINDINGS  Alignment:  Normal  Vertebrae: Normal  Cord:  Normal  Paraspinal and other soft tissues: Unremarkable  Disc levels:  No significant thoracic disc protrusions, spinal or foraminal stenosis. Shallow central disc protrusion noted at T7-8 with minimal impression on the ventral thecal sac.  IMPRESSION: 1. 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 2. Normal thoracic spine examination except for mild disc disease and facet disease and a shallow central disc protrusion at T7-8.   Electronically Signed   By: Marijo Sanes M.D.   On: 09/20/2015 11:04    Shoulder Imaging: Shoulder-R DG:  Results for orders placed during the hospital encounter of 11/23/15  DG Shoulder Right   Narrative CLINICAL DATA:  Gradual worsening of bilateral shoulder pain. History of lupus. No history of previous shoulder injury nor of surgery.  EXAM: LEFT SHOULDER - 2+ VIEW; RIGHT SHOULDER - 2+ VIEW  COMPARISON:  None in PACs  FINDINGS: Right shoulder: The bones are subjectively osteopenic. The glenohumeral and AC joint spaces are reasonably well maintained. The articular surfaces of the humeral head and adjacent glenoid appear normal. The subacromial subdeltoid  space is normal. The observed portions of the right clavicle and upper right ribs are normal.  Left shoulder: The bones are subjectively osteopenic similar to that on the right. The glenohumeral and AC joint spaces are preserved. The articular surfaces of the humeral head and acetabulum remains smoothly rounded. The subacromial subdeltoid space is normal. The observed portions of the left clavicle and upper left ribs are normal.  IMPRESSION: There is no acute or significant chronic bony abnormality of either shoulder.   Electronically Signed   By: David  Martinique M.D.   On: 11/23/2015 14:26    Shoulder-L DG:  Results for orders placed during the hospital encounter of 11/23/15  DG Shoulder Left   Narrative CLINICAL DATA:  Gradual worsening of bilateral shoulder pain. History of lupus. No history of previous shoulder injury nor of surgery.  EXAM: LEFT SHOULDER - 2+ VIEW; RIGHT SHOULDER - 2+ VIEW  COMPARISON:  None in PACs  FINDINGS: Right shoulder: The bones are subjectively osteopenic. The glenohumeral and AC joint spaces are reasonably well maintained. The articular surfaces of the humeral head and adjacent glenoid appear normal. The subacromial subdeltoid space is normal. The observed portions of the right clavicle and upper right ribs are normal.  Left shoulder: The bones are subjectively osteopenic similar to that on the right. The glenohumeral and AC joint spaces are preserved. The articular surfaces of the humeral head and acetabulum remains smoothly rounded. The subacromial subdeltoid space is normal. The observed portions of the left clavicle and upper left ribs are normal.  IMPRESSION: There is no acute or significant chronic bony abnormality of either shoulder.   Electronically Signed   By: David  Martinique M.D.   On: 11/23/2015 14:26    Thoracic Imaging: Thoracic MR wo contrast:  Results for orders placed during the hospital encounter of 09/20/15  MR  Thoracic Spine Wo Contrast   Narrative CLINICAL DATA:  Long history of neck and back pain.  EXAM: MRI CERVICAL, THORACIC  WITHOUT CONTRAST  TECHNIQUE: Multiplanar and multiecho pulse sequences of the cervical spine, to include the craniocervical junction and cervicothoracic junction and thoracic spine were obtained without intravenous  contrast.  COMPARISON:  None.  FINDINGS: MRI CERVICAL SPINE FINDINGS  Examination is limited due to patient motion and poor image quality.  Alignment: Normal.  Vertebrae: Grossly normal.  Cord: Grossly normal.  Posterior Fossa, vertebral arteries, paraspinal tissues: No significant findings.  Disc levels:  C2-3:  No significant findings.  C3-4: Shallow central disc protrusion with mild mass effect on the ventral thecal sac. Mild narrowing of the ventral CSF space. Mild right foraminal narrowing due to uncinate spurring.  C4-5: Bulging annulus, osteophytic ridging and uncinate spurring with mass effect on the ventral thecal sac, asymmetric right. Narrowing of the ventral CSF space. Moderate bilateral foraminal stenosis.  C5-6: Shallow central disc protrusion, osteophytic ridging and uncinate spurring with flattening of the ventral thecal sac and narrowing of the ventral CSF space. Moderate left and mild right foraminal stenosis.  C6-7: Broad-based disc protrusion asymmetrical left with mass effect on the thecal sac and narrowing of the ventral CSF space. No significant foraminal stenosis.  C7-T1: No significant findings.  MRI THORACIC SPINE FINDINGS  Alignment:  Normal  Vertebrae: Normal  Cord:  Normal  Paraspinal and other soft tissues: Unremarkable  Disc levels:  No significant thoracic disc protrusions, spinal or foraminal stenosis. Shallow central disc protrusion noted at T7-8 with minimal impression on the ventral thecal sac.  IMPRESSION: 1. Multilevel disc disease in the cervical spine with disc protrusions,  bulging degenerated discs, osteophytic ridging and uncinate spurring with mild multilevel spinal and foraminal stenosis as discussed above at the individual levels. 2. Normal thoracic spine examination except for mild disc disease and facet disease and a shallow central disc protrusion at T7-8.   Electronically Signed   By: Marijo Sanes M.D.   On: 09/20/2015 11:04    Complexity Note: Imaging results reviewed. Results shared with Ms. Ashmore, using Layman's terms.                         Meds   Current Outpatient Medications:  .  albuterol (PROVENTIL HFA;VENTOLIN HFA) 108 (90 Base) MCG/ACT inhaler, Inhale 1-2 puffs into the lungs every 6 (six) hours as needed for wheezing or shortness of breath., Disp: 1 Inhaler, Rfl: 0 .  cyclobenzaprine (FLEXERIL) 10 MG tablet, Take 1 tablet (10 mg total) by mouth 3 (three) times daily as needed for muscle spasms., Disp: 90 tablet, Rfl: 2 .  DULoxetine (CYMBALTA) 30 MG capsule, Take 30 mg by mouth daily., Disp: , Rfl:  .  gabapentin (NEURONTIN) 300 MG capsule, Take 1 capsule (300 mg total) by mouth 3 (three) times daily., Disp: 90 capsule, Rfl: 2 .  hydroxychloroquine (PLAQUENIL) 200 MG tablet, Take by mouth., Disp: , Rfl:  .  nicotine (NICODERM CQ - DOSED IN MG/24 HOURS) 21 mg/24hr patch, Place 21 mg onto the skin daily., Disp: , Rfl:  .  [START ON 01/05/2018] oxyCODONE (OXY IR/ROXICODONE) 5 MG immediate release tablet, Take 1 tablet (5 mg total) by mouth 2 (two) times daily., Disp: 60 tablet, Rfl: 0 .  oxyCODONE (OXY IR/ROXICODONE) 5 MG immediate release tablet, Take 1 tablet (5 mg total) by mouth 2 (two) times daily., Disp: 60 tablet, Rfl: 0 .  pantoprazole (PROTONIX) 40 MG tablet, Take 40 mg by mouth daily., Disp: , Rfl:  .  DULoxetine (CYMBALTA) 30 MG capsule, TAKE 1 CAPSULE(30 MG) BY MOUTH EVERY DAY, Disp: , Rfl:  .  oxyCODONE (OXY IR/ROXICODONE) 5 MG immediate release tablet, Take 1 tablet (5 mg total) by mouth 2 (two)  times daily., Disp: 60  tablet, Rfl: 0  ROS  Constitutional: Denies any fever or chills Gastrointestinal: No reported hemesis, hematochezia, vomiting, or acute GI distress Musculoskeletal: Denies any acute onset joint swelling, redness, loss of ROM, or weakness Neurological: No reported episodes of acute onset apraxia, aphasia, dysarthria, agnosia, amnesia, paralysis, loss of coordination, or loss of consciousness  Allergies  Ms. Massaro is allergic to trazodone; bupropion; methocarbamol; oxycodone-acetaminophen; tramadol; and trazodone and nefazodone.  [place]  Drug: Ms. Wiles  reports that she does not use drugs. Alcohol:  reports that she does not drink alcohol. Tobacco:  reports that she has been smoking cigarettes. She has a 17.50 pack-year smoking history. She has never used smokeless tobacco. Medical:  has a past medical history of Anxiety, Bladder infection, Chronic lower back pain, Chronic neck pain, Depression, Diverticulitis, Hyperlipidemia, Lupus ([place]), and Overactive bladder. Surgical: Ms. Chavana  has a past surgical history that includes Abdominal hysterectomy; Colonoscopy; Colonoscopy with propofol (N/A, 07/03/2017); Tonsillectomy; Esophagogastroduodenoscopy (N/A, 10/21/2017); and Colonoscopy with propofol (N/A, 10/21/2017). Family: family history includes Cancer in her mother; Diabetes in her brother; Emphysema in her mother; Glaucoma in her father; Heart disease in her father; Hypertension in her mother; Stroke in her mother.  Constitutional Exam  General appearance: Well nourished, well developed, and well hydrated. In no apparent acute distress Vitals:   12/10/17 1336  BP: 140/75  Pulse: (!) 102  Resp: 18  Temp: (!) 97.4 F (36.3 C)  SpO2: 99%  Weight: 145 lb (65.8 kg)  Height: 5' 3"  (1.6 m)   BMI Assessment: Estimated body mass index is 25.69 kg/m as calculated from the following:   Height as of this encounter: 5' 3"  (1.6 m).   Weight as of this encounter: 145 lb (65.8  kg).  BMI interpretation table: BMI level Category Range association with higher incidence of chronic pain  <18 kg/m2 Underweight   18.5-24.9 kg/m2 Ideal body weight   25-29.9 kg/m2 Overweight Increased incidence by 20%  30-34.9 kg/m2 Obese (Class I) Increased incidence by 68%  35-39.9 kg/m2 Severe obesity (Class II) Increased incidence by 136%  >40 kg/m2 Extreme obesity (Class III) Increased incidence by 254%   Patient's current BMI Ideal Body weight  Body mass index is 25.69 kg/m. Ideal body weight: 52.4 kg (115 lb 8.3 oz) Adjusted ideal body weight: 57.7 kg (127 lb 5 oz)   BMI Readings from Last 4 Encounters:  12/10/17 25.69 kg/m  12/04/17 25.69 kg/m  11/06/17 25.69 kg/m  10/23/17 25.69 kg/m   Wt Readings from Last 4 Encounters:  12/10/17 145 lb (65.8 kg)  12/04/17 145 lb (65.8 kg)  11/06/17 145 lb (65.8 kg)  10/23/17 145 lb (65.8 kg)  Psych/Mental status: Alert, oriented x 3 (person, place, & time)       Eyes: PERLA Respiratory: No evidence of acute respiratory distress  Cervical Spine Area Exam  Skin & Axial Inspection: No masses, redness, edema, swelling, or associated skin lesions Alignment: Symmetrical Functional ROM: Diminished ROM      Stability: No instability detected Muscle Tone/Strength: Guarding observed Sensory (Neurological): Movement-associated discomfort.  Significant tenderness to palpation over the area of the left greater occipital nerve at the level of the occipital ridge. Palpation: (+) Positive provocative maneuver for for left occipital neuralgia  Upper Extremity (UE) Exam    Side: Right upper extremity  Side: Left upper extremity  Skin & Extremity Inspection: Skin color, temperature, and hair growth are WNL. No peripheral edema or cyanosis. No masses, redness, swelling, asymmetry,  or associated skin lesions. No contractures.  Skin & Extremity Inspection: Skin color, temperature, and hair growth are WNL. No peripheral edema or cyanosis. No  masses, redness, swelling, asymmetry, or associated skin lesions. No contractures.  Functional ROM: Unrestricted ROM          Functional ROM: Unrestricted ROM          Muscle Tone/Strength: Functionally intact. No obvious neuro-muscular anomalies detected.  Muscle Tone/Strength: Functionally intact. No obvious neuro-muscular anomalies detected.  Sensory (Neurological): Unimpaired          Sensory (Neurological): Unimpaired          Palpation: No palpable anomalies              Palpation: No palpable anomalies              Provocative Test(s):  Phalen's test: deferred Tinel's test: deferred Apley's scratch test (touch opposite shoulder):  Action 1 (Across chest): deferred Action 2 (Overhead): deferred Action 3 (LB reach): deferred   Provocative Test(s):  Phalen's test: deferred Tinel's test: deferred Apley's scratch test (touch opposite shoulder):  Action 1 (Across chest): deferred Action 2 (Overhead): deferred Action 3 (LB reach): deferred    Thoracic Spine Area Exam  Skin & Axial Inspection: No masses, redness, or swelling Alignment: Symmetrical Functional ROM: Unrestricted ROM Stability: No instability detected Muscle Tone/Strength: Functionally intact. No obvious neuro-muscular anomalies detected. Sensory (Neurological): Unimpaired Muscle strength & Tone: No palpable anomalies  Lumbar Spine Area Exam  Skin & Axial Inspection: No masses, redness, or swelling Alignment: Symmetrical Functional ROM: Unrestricted ROM       Stability: No instability detected Muscle Tone/Strength: Functionally intact. No obvious neuro-muscular anomalies detected. Sensory (Neurological): Unimpaired Palpation: No palpable anomalies       Provocative Tests: Hyperextension/rotation test: deferred today       Lumbar quadrant test (Kemp's test): deferred today       Lateral bending test: deferred today       Patrick's Maneuver: deferred today                   FABER test: deferred today                    S-I anterior distraction/compression test: deferred today         S-I lateral compression test: deferred today         S-I Thigh-thrust test: deferred today         S-I Gaenslen's test: deferred today          Gait & Posture Assessment  Ambulation: Unassisted Gait: Relatively normal for age and body habitus Posture: WNL   Lower Extremity Exam    Side: Right lower extremity  Side: Left lower extremity  Stability: No instability observed          Stability: No instability observed          Skin & Extremity Inspection: Skin color, temperature, and hair growth are WNL. No peripheral edema or cyanosis. No masses, redness, swelling, asymmetry, or associated skin lesions. No contractures.  Skin & Extremity Inspection: Skin color, temperature, and hair growth are WNL. No peripheral edema or cyanosis. No masses, redness, swelling, asymmetry, or associated skin lesions. No contractures.  Functional ROM: Unrestricted ROM                  Functional ROM: Unrestricted ROM                  Muscle  Tone/Strength: Functionally intact. No obvious neuro-muscular anomalies detected.  Muscle Tone/Strength: Functionally intact. No obvious neuro-muscular anomalies detected.  Sensory (Neurological): Unimpaired medial portion of foot (L4)  Sensory (Neurological): Unimpaired medial portion of foot (L4)  DTR: Patellar: deferred today Achilles: deferred today Plantar: deferred today  DTR: Patellar: deferred today Achilles: deferred today Plantar: deferred today  Palpation: No palpable anomalies  Palpation: No palpable anomalies   Assessment  Primary Diagnosis & Pertinent Problem List: The primary encounter diagnosis was Occipital headache. Diagnoses of Cervico-occipital neuralgia, Neurogenic pain, Cervicalgia, Cervical spondylosis, Cervical facet syndrome (Bilateral) (R>L), and Spondylosis without myelopathy or radiculopathy, cervical region were also pertinent to this visit.  Status Diagnosis   Controlled Controlled Controlled 1. Occipital headache   2. Cervico-occipital neuralgia   3. Neurogenic pain   4. Cervicalgia   5. Cervical spondylosis   6. Cervical facet syndrome (Bilateral) (R>L)   7. Spondylosis without myelopathy or radiculopathy, cervical region     Problems updated and reviewed during this visit: Problem  Occipital headache (Left)  Neurogenic Pain  Cervico-occipital neuralgia (Left)  Rash   New face, arms    Plan of Care  Pharmacotherapy (Medications Ordered): No orders of the defined types were placed in this encounter.  Medications administered today: Ryah L. Kraszewski had no medications administered during this visit.   Procedure Orders     GREATER OCCIPITAL NERVE BLOCK Lab Orders  No laboratory test(s) ordered today   Imaging Orders  No imaging studies ordered today   Referral Orders  No referral(s) requested today   Interventional management options: Planned, scheduled, and/or pending:   Diagnostic/Therapeutic Left GONB #1   Considering:   Diagnostic bilateral intra-articular shoulder joint injection Diagnostic bilateral suprascapular nerve block Possible bilateral suprascapular nerve RFA Right-sided cervical epidural steroid injection(Series #2)  Diagnostic bilateral cervical facet block Possible bilateral cervical facet RFA  Diagnostic bilateral lumbar facet block Possible bilateral lumbar facet RFA     Palliative PRN treatment(s):   Palliative right-sided cervical ESI(Series #2) Palliative right-sided cervical facet RFA#3(Last done on: 10/23/17) Palliative left-sided cervical facet RFA #2 (Last done on: 06/26/2016)   Provider-requested follow-up: Return for Procedure (w/ sedation): (L) Occipital NB #1.  Future Appointments  Date Time Provider [place]  12/25/2017  1:00 PM Milinda Pointer, MD [facility] None  02/04/2018  9:30 AM Vevelyn Francois, NP [facility] None   Primary Care Physician: Ricardo Jericho, NP Location: [facility] Facility Note by: Gaspar Cola, MD Date: 12/10/2017; Time: 5:15 PM

## 2017-12-25 ENCOUNTER — Encounter: Payer: Self-pay | Admitting: Pain Medicine

## 2017-12-25 ENCOUNTER — Ambulatory Visit
Admission: RE | Admit: 2017-12-25 | Discharge: 2017-12-25 | Disposition: A | Discharge status: Home / Self Care | Payer: Medicaid Other | Source: Ambulatory Visit | Attending: Pain Medicine | Admitting: Pain Medicine

## 2017-12-25 ENCOUNTER — Other Ambulatory Visit: Payer: Self-pay

## 2017-12-25 ENCOUNTER — Ambulatory Visit: Payer: Medicaid Other | Admitting: Pain Medicine

## 2017-12-25 VITALS — BP 130/75 | HR 80 | Temp 97.9°F | Resp 14 | Ht 63.0 in | Wt 147.0 lb

## 2017-12-25 DIAGNOSIS — M5481 Occipital neuralgia: Secondary | ICD-10-CM | POA: Insufficient documentation

## 2017-12-25 DIAGNOSIS — Z79891 Long term (current) use of opiate analgesic: Secondary | ICD-10-CM | POA: Insufficient documentation

## 2017-12-25 DIAGNOSIS — R51 Headache: Secondary | ICD-10-CM | POA: Insufficient documentation

## 2017-12-25 DIAGNOSIS — Z79899 Other long term (current) drug therapy: Secondary | ICD-10-CM | POA: Diagnosis not present

## 2017-12-25 DIAGNOSIS — Z888 Allergy status to other drugs, medicaments and biological substances status: Secondary | ICD-10-CM | POA: Diagnosis not present

## 2017-12-25 DIAGNOSIS — Z885 Allergy status to narcotic agent status: Secondary | ICD-10-CM | POA: Diagnosis not present

## 2017-12-25 DIAGNOSIS — F419 Anxiety disorder, unspecified: Secondary | ICD-10-CM | POA: Insufficient documentation

## 2017-12-25 DIAGNOSIS — M542 Cervicalgia: Secondary | ICD-10-CM | POA: Insufficient documentation

## 2017-12-25 DIAGNOSIS — R519 Headache, unspecified: Secondary | ICD-10-CM

## 2017-12-25 MED ORDER — MIDAZOLAM HCL 5 MG/5ML IJ SOLN
1.0000 mg | INTRAMUSCULAR | Status: DC | PRN
  Administered 2017-12-25: 5 mg via INTRAVENOUS
  Filled 2017-12-25: qty 5

## 2017-12-25 MED ORDER — DEXAMETHASONE SODIUM PHOSPHATE 10 MG/ML IJ SOLN
10.0000 mg | Freq: Once | INTRAMUSCULAR | Status: AC
  Administered 2017-12-25: 10 mg
  Filled 2017-12-25: qty 1

## 2017-12-25 MED ORDER — LIDOCAINE HCL 2 % IJ SOLN
20.0000 mL | Freq: Once | INTRAMUSCULAR | Status: AC
  Administered 2017-12-25: 400 mg
  Filled 2017-12-25: qty 40

## 2017-12-25 MED ORDER — ROPIVACAINE HCL 2 MG/ML IJ SOLN
9.0000 mL | Freq: Once | INTRAMUSCULAR | Status: AC
  Administered 2017-12-25: 9 mL
  Filled 2017-12-25: qty 10

## 2017-12-25 MED ORDER — FENTANYL CITRATE (PF) 100 MCG/2ML IJ SOLN
25.0000 ug | INTRAMUSCULAR | Status: DC | PRN
  Administered 2017-12-25: 100 ug via INTRAVENOUS
  Filled 2017-12-25: qty 2

## 2017-12-25 MED ORDER — LACTATED RINGERS IV SOLN
1000.0000 mL | Freq: Once | INTRAVENOUS | Status: AC
  Administered 2017-12-25: 1000 mL via INTRAVENOUS

## 2017-12-25 NOTE — Progress Notes (Signed)
Safety precautions to be maintained throughout the outpatient stay will include: orient to surroundings, keep bed in low position, maintain call bell within reach at all times, provide assistance with transfer out of bed and ambulation.  

## 2017-12-25 NOTE — Progress Notes (Signed)
Patient's Name: Tanya [name]  MRN: [id number]  Referring Provider: Ricardo Jericho*  DOB: August 21, 1960  PCP: Ricardo Jericho, NP  DOS: 12/25/2017  Note by: Gaspar Cola, MD  Service setting: Ambulatory outpatient  Specialty: Interventional Pain Management  Patient type: Established  Location: [facility] (AMB) Pain Management Facility  Visit type: Interventional Procedure   Primary Reason for Visit: Interventional Pain Management Treatment. CC: Headache (left)  Procedure:          Anesthesia, Analgesia, Anxiolysis:  Type: Diagnostic, Greater, Occipital Nerve Block  #1  Region: Posterolateral Cervical Level: Occipital Ridge   Laterality: Left-Sided  Type: Moderate (Conscious) Sedation combined with Local Anesthesia Indication(s): Analgesia and Anxiety Route: Intravenous (IV) IV Access: Secured Sedation: Meaningful verbal contact was maintained at all times during the procedure  Local Anesthetic: Lidocaine 1-2%  Position: Prone   Indications: 1. Occipital headache (Left)   2. Cervico-occipital neuralgia (Left)   3. Cervicalgia    Pain Score: Pre-procedure: 8 /10 Post-procedure: 0-No pain/10  Pre-op Assessment:  Tanya [name] is a 57 y.o. (year old), female patient, seen today for interventional treatment. She  has a past surgical history that includes Abdominal hysterectomy; Colonoscopy; Colonoscopy with propofol (N/A, 07/03/2017); Tonsillectomy; Esophagogastroduodenoscopy (N/A, 10/21/2017); and Colonoscopy with propofol (N/A, 10/21/2017). Tanya [name] has a current medication list which includes the following prescription(s): albuterol, cyclobenzaprine, duloxetine, gabapentin, hydroxychloroquine, nicotine, oxycodone, oxycodone, pantoprazole, duloxetine, and oxycodone, and the following Facility-Administered Medications: fentanyl and midazolam. Her primarily concern today is the Headache (left)  Initial Vital Signs:  Pulse/HCG Rate: 86ECG Heart Rate: 82 Temp: 97.9  F (36.6 C) Resp: 16 BP: 125/76 SpO2: 98 %  BMI: Estimated body mass index is 26.04 kg/m as calculated from the following:   Height as of this encounter: 5\' 3"  (1.6 m).   Weight as of this encounter: 147 lb (66.7 kg).  Risk Assessment: Allergies: Reviewed. She is allergic to trazodone; bupropion; methocarbamol; oxycodone-acetaminophen; tramadol; and trazodone and nefazodone.  Allergy Precautions: None required Coagulopathies: Reviewed. None identified.  Blood-thinner therapy: None at this time Active Infection(s): Reviewed. None identified. Tanya [name] is afebrile  Site Confirmation: Tanya [name] was asked to confirm the procedure and laterality before marking the site Procedure checklist: Completed Consent: Before the procedure and under the influence of no sedative(s), amnesic(s), or anxiolytics, the patient was informed of the treatment options, risks and possible complications. To fulfill our ethical and legal obligations, as recommended by the American Medical Association's Code of Ethics, I have informed the patient of my clinical impression; the nature and purpose of the treatment or procedure; the risks, benefits, and possible complications of the intervention; the alternatives, including doing nothing; the risk(s) and benefit(s) of the alternative treatment(s) or procedure(s); and the risk(s) and benefit(s) of doing nothing. The patient was provided information about the general risks and possible complications associated with the procedure. These may include, but are not limited to: failure to achieve desired goals, infection, bleeding, organ or nerve damage, allergic reactions, paralysis, and death. In addition, the patient was informed of those risks and complications associated to the procedure, such as failure to decrease pain; infection; bleeding; organ or nerve damage with subsequent damage to sensory, motor, and/or autonomic systems, resulting in permanent pain, numbness,  and/or weakness of one or several areas of the body; allergic reactions; (i.e.: anaphylactic reaction); and/or death. Furthermore, the patient was informed of those risks and complications associated with the medications. These include, but are not limited to: allergic reactions (i.e.: anaphylactic or  anaphylactoid reaction(s)); adrenal axis suppression; blood sugar elevation that in diabetics may result in ketoacidosis or comma; water retention that in patients with history of congestive heart failure may result in shortness of breath, pulmonary edema, and decompensation with resultant heart failure; weight gain; swelling or edema; medication-induced neural toxicity; particulate matter embolism and blood vessel occlusion with resultant organ, and/or nervous system infarction; and/or aseptic necrosis of one or more joints. Finally, the patient was informed that Medicine is not an exact science; therefore, there is also the possibility of unforeseen or unpredictable risks and/or possible complications that may result in a catastrophic outcome. The patient indicated having understood very clearly. We have given the patient no guarantees and we have made no promises. Enough time was given to the patient to ask questions, all of which were answered to the patient's satisfaction. Tanya [name] has indicated that she wanted to continue with the procedure. Attestation: I, the ordering provider, attest that I have discussed with the patient the benefits, risks, side-effects, alternatives, likelihood of achieving goals, and potential problems during recovery for the procedure that I have provided informed consent. Date  Time: 12/25/2017  1:03 PM  Pre-Procedure Preparation:  Monitoring: As per clinic protocol. Respiration, ETCO2, SpO2, BP, heart rate and rhythm monitor placed and checked for adequate function Safety Precautions: Patient was assessed for positional comfort and pressure points before starting the  procedure. Time-out: I initiated and conducted the "Time-out" before starting the procedure, as per protocol. The patient was asked to participate by confirming the accuracy of the "Time Out" information. Verification of the correct person, site, and procedure were performed and confirmed by me, the nursing staff, and the patient. "Time-out" conducted as per Joint Commission's Universal Protocol (UP.01.01.01). Time: 1335  Description of Procedure:          Target Area: Area medial to the occipital artery at the level of the superior nuchal ridge Approach: Posterior approach Area Prepped: Entire Posterior Occipital Region Prepping solution: ChloraPrep (2% chlorhexidine gluconate and 70% isopropyl alcohol) Safety Precautions: Aspiration looking for blood return was conducted prior to all injections. At no point did we inject any substances, as a needle was being advanced. No attempts were made at seeking any paresthesias. Safe injection practices and needle disposal techniques used. Medications properly checked for expiration dates. SDV (single dose vial) medications used. Description of the Procedure: Protocol guidelines were followed. The target area was identified and the area prepped in the usual manner. Skin & deeper tissues infiltrated with local anesthetic. Appropriate amount of time allowed to pass for local anesthetics to take effect. The procedure needles were then advanced to the target area. Proper needle placement secured. Negative aspiration confirmed. Solution injected in intermittent fashion, asking for systemic symptoms every 0.5cc of injectate. The needles were then removed and the area cleansed, making sure to leave some of the prepping solution back to take advantage of its long term bactericidal properties.  Vitals:   12/25/17 1340 12/25/17 1350 12/25/17 1400 12/25/17 1410  BP: 126/78 133/88 (!) 142/65 130/75  Pulse: 80     Resp: 15 (!) 24 17 14   Temp:  98 F (36.7 C)  97.9 F  (36.6 C)  TempSrc:  Temporal  Temporal  SpO2: 98% 95% 97% 98%  Weight:      Height:        Start Time: 1335 hrs. End Time: 1339 hrs. Materials:  Needle(s) Type: Spinal Needle Gauge: 22G Length: 3.5-in Medication(s): Please see orders for medications and dosing  details.  Imaging Guidance (Non-Spinal):          Type of Imaging Technique: Fluoroscopy Guidance (Non-Spinal) Indication(s): Assistance in needle guidance and placement for procedures requiring needle placement in or near specific anatomical locations not easily accessible without such assistance. Exposure Time: Please see nurses notes. Contrast: None used. Fluoroscopic Guidance: I was personally present during the use of fluoroscopy. "Tunnel Vision Technique" used to obtain the best possible view of the target area. Parallax error corrected before commencing the procedure. "Direction-depth-direction" technique used to introduce the needle under continuous pulsed fluoroscopy. Once target was reached, antero-posterior, oblique, and lateral fluoroscopic projection used confirm needle placement in all planes. Images permanently stored in EMR. Interpretation: No contrast injected. I personally interpreted the imaging intraoperatively. Adequate needle placement confirmed in multiple planes. Permanent images saved into the patient's record.  Antibiotic Prophylaxis:   Anti-infectives (From admission, onward)   None     Indication(s): None identified  Post-operative Assessment:  Post-procedure Vital Signs:  Pulse/HCG Rate: 8079 Temp: 97.9 F (36.6 C) Resp: 14 BP: 130/75 SpO2: 98 %  EBL: None  Complications: No immediate post-treatment complications observed by team, or reported by patient.  Note: The patient tolerated the entire procedure well. A repeat set of vitals were taken after the procedure and the patient was kept under observation following institutional policy, for this type of procedure. Post-procedural  neurological assessment was performed, showing return to baseline, prior to discharge. The patient was provided with post-procedure discharge instructions, including a section on how to identify potential problems. Should any problems arise concerning this procedure, the patient was given instructions to immediately contact us, at any time, without hesitation. In any case, we plan to contact the patient by telephone for a follow-up status report regarding this interventional procedure.  Comments:  No additional relevant information.  Plan of Care    Imaging Orders     DG C-Arm 1-60 Min-No Report  Procedure Orders     GREATER OCCIPITAL NERVE BLOCK  Medications ordered for procedure: Meds ordered this encounter  Medications  . lidocaine (XYLOCAINE) 2 % (with pres) injection 400 mg  . midazolam (VERSED) 5 MG/5ML injection 1-2 mg    Make sure Flumazenil is available in the pyxis when using this medication. If oversedation occurs, administer 0.2 mg IV over 15 sec. If after 45 sec no response, administer 0.2 mg again over 1 min; may repeat at 1 min intervals; not to exceed 4 doses (1 mg)  . fentaNYL (SUBLIMAZE) injection 25-50 mcg    Make sure Narcan is available in the pyxis when using this medication. In the event of respiratory depression (RR< 8/min): Titrate NARCAN (naloxone) in increments of 0.1 to 0.2 mg IV at 2-3 minute intervals, until desired degree of reversal.  . lactated ringers infusion 1,000 mL  . ropivacaine (PF) 2 mg/mL (0.2%) (NAROPIN) injection 9 mL  . dexamethasone (DECADRON) injection 10 mg   Medications administered: We administered lidocaine, midazolam, fentaNYL, lactated ringers, ropivacaine (PF) 2 mg/mL (0.2%), and dexamethasone.  See the medical record for exact dosing, route, and time of administration.  Disposition: Discharge home  Discharge Date & Time: 12/25/2017; 1411 hrs.   Physician-requested Follow-up: Return for post-procedure eval (2 wks), w/ Dr.  Dossie Arbour.  Future Appointments  Date Time Provider [place]  01/07/2018  1:30 PM Milinda Pointer, MD [facility] None  02/04/2018  9:30 AM Vevelyn Francois, NP [facility] None   Primary Care Physician: Ricardo Jericho, NP Location: [facility] Out[facility] Management  Facility Note by: Gaspar Cola, MD Date: 12/25/2017; Time: 2:19 PM  Disclaimer:  Medicine is not an Chief Strategy Officer. The only guarantee in medicine is that nothing is guaranteed. It is important to note that the decision to proceed with this intervention was based on the information collected from the patient. The Data and conclusions were drawn from the patient's questionnaire, the interview, and the physical examination. Because the information was provided in large part by the patient, it cannot be guaranteed that it has not been purposely or unconsciously manipulated. Every effort has been made to obtain as much relevant data as possible for this evaluation. It is important to note that the conclusions that lead to this procedure are derived in large part from the available data. Always take into account that the treatment will also be dependent on availability of resources and existing treatment guidelines, considered by other Pain Management Practitioners as being common knowledge and practice, at the time of the intervention. For Medico-Legal purposes, it is also important to point out that variation in procedural techniques and pharmacological choices are the acceptable norm. The indications, contraindications, technique, and results of the above procedure should only be interpreted and judged by a Board-Certified Interventional Pain Specialist with extensive familiarity and expertise in the same exact procedure and technique.

## 2017-12-25 NOTE — Patient Instructions (Signed)

## 2017-12-26 ENCOUNTER — Telehealth: Payer: Self-pay | Admitting: *Deleted

## 2017-12-26 NOTE — Telephone Encounter (Signed)
Attempted to call for post procedure follow-up. Message left. 

## 2018-01-06 NOTE — Progress Notes (Deleted)
NO SHOW

## 2018-01-07 ENCOUNTER — Ambulatory Visit: Payer: Medicaid Other | Admitting: Pain Medicine

## 2018-01-16 ENCOUNTER — Encounter: Payer: Self-pay | Admitting: Emergency Medicine

## 2018-01-16 ENCOUNTER — Ambulatory Visit
Admission: EM | Admit: 2018-01-16 | Discharge: 2018-01-16 | Disposition: A | Discharge status: Home / Self Care | Payer: Medicaid Other | Attending: Family Medicine | Admitting: Family Medicine

## 2018-01-16 ENCOUNTER — Other Ambulatory Visit: Payer: Self-pay

## 2018-01-16 DIAGNOSIS — J40 Bronchitis, not specified as acute or chronic: Secondary | ICD-10-CM | POA: Insufficient documentation

## 2018-01-16 MED ORDER — AZITHROMYCIN 250 MG PO TABS: 250.0000 mg | ORAL_TABLET | Freq: Every day | ORAL | 0 refills | Status: DC

## 2018-01-16 MED ORDER — PREDNISONE 20 MG PO TABS: 40.0000 mg | ORAL_TABLET | Freq: Every day | ORAL | 0 refills | Status: AC

## 2018-01-16 MED ORDER — GUAIFENESIN-CODEINE 100-10 MG/5ML PO SYRP: 5.0000 mL | ORAL_SOLUTION | ORAL | 0 refills | Status: DC | PRN

## 2018-01-16 NOTE — Discharge Instructions (Signed)
Take medications as prescribed. Start Mucinex DM twice daily.  Medication is available over-the-counter.  Drink plenty of fluids.  Tylenol or ibuprofen as needed.  Stop smoking.

## 2018-01-16 NOTE — ED Provider Notes (Signed)
[facility]    CSN: [id number] Arrival date & time: 01/16/18  1728     History   Chief Complaint Chief Complaint  Patient presents with  . Cough  . Fever    HPI CHACE BISCH is a 57 y.o. female.   Subjective:   ALIEYAH SPADER is a 57 y.o. female here for evaluation of a cough.  The cough is productive of green/yellow sputum, with wheezing, with shortness of breath during the cough, chest is painful during coughing, worsening over time and is aggravated by nothing. Onset of symptoms was 5 days ago, gradually worsening since that time.  Associated symptoms include fever. Patient does not have a history of asthma. Patient has not had recent travel. Patient does have a history of smoking.  The following portions of the patient's history were reviewed and updated as appropriate: allergies, current medications, past family history, past medical history, past social history, past surgical history and problem list.       Past Medical History:  Diagnosis Date  . Anxiety   . Bladder infection    8/18  . Chronic lower back pain   . Chronic neck pain   . Depression   . Diverticulitis   . Hyperlipidemia   . Lupus ([place])   . Overactive bladder     Patient Active Problem List   Diagnosis Date Noted  . Occipital headache (Left) 12/10/2017  . Neurogenic pain 12/10/2017  . Cervico-occipital neuralgia (Left) 12/10/2017  . Pharmacologic therapy 09/01/2017  . Disorder of skeletal system 09/01/2017  . Problems influencing health status 09/01/2017  . Spondylosis without myelopathy or radiculopathy, cervical region 08/05/2017  . Cervicalgia 08/05/2017  . Rash 05/13/2017  . Cervical spondylosis 04/15/2016  . Cervical facet syndrome (Bilateral) (R>L) 04/15/2016  . Shoulder radicular pain (Bilateral) (R>L) 04/15/2016  . Musculoskeletal pain 03/07/2016  . Vitamin D insufficiency 12/19/2015  . 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 Anxiety  11/23/2015  . Chronic low back pain [facility] source of pain) (Bilateral) (R>L) 11/23/2015  . Chronic neck pain (Primary Source of Pain) (Bilateral) (R>L) 11/23/2015  . Long term prescription benzodiazepine use 11/23/2015  . Long term current use of opiate analgesic 11/23/2015  . Long term prescription opiate use 11/23/2015  . Opiate use (10 MME/Day) 11/23/2015  . Chronic pain syndrome 11/23/2015  . Chronic upper extremity pain (Right) 11/23/2015  . Chronic cervical radicular pain (Right) 11/23/2015  . Chronic shoulder pain (Bilateral) (R>L) 11/23/2015  . Hypercholesterolemia 06/20/2015  . 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 Encounter for long-term (current) use of high-risk medication 06/06/2015  . Pain medication agreement signed 11/24/2014  . Hyperlipidemia 01/25/2014  . 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 Diverticulitis 10/04/2012  . Osteoarthritis 10/04/2012  . Chronic upper back pain (Secondary source of pain) (Bilateral) (R>L) 08/17/2012  . Urinary incontinence 08/17/2012  . Fibromyalgia 06/04/2012  . SLE (systemic lupus erythematosus) ([place]) 02/18/2012  . Generalized anxiety disorder 01/07/2012  . Major depressive disorder, recurrent ([place]) 09/29/2009    Past Surgical History:  Procedure Laterality Date  . ABDOMINAL HYSTERECTOMY    . COLONOSCOPY    . COLONOSCOPY WITH PROPOFOL N/A 07/03/2017   Procedure: COLONOSCOPY WITH PROPOFOL;  Surgeon: [name], MD;  Location: [facility] ENDOSCOPY;  Service: Endoscopy;  Laterality: N/A;  . COLONOSCOPY WITH PROPOFOL N/A 10/21/2017   Procedure: COLONOSCOPY WITH PROPOFOL;  Surgeon: [name], MD;  Location: [facility] ENDOSCOPY;  Service: Endoscopy;  Laterality: N/A;  . ESOPHAGOGASTRODUODENOSCOPY N/A 10/21/2017  Procedure: ESOPHAGOGASTRODUODENOSCOPY (EGD);  Surgeon: [name], MD;  Location: [facility] ENDOSCOPY;  Service: Endoscopy;  Laterality: N/A;  . TONSILLECTOMY      OB History   No obstetric history on file.       Home Medications    Prior to Admission medications   Medication Sig Start Date End Date Taking? Authorizing Provider  cyclobenzaprine (FLEXERIL) 10 MG tablet Take 1 tablet (10 mg total) by mouth 3 (three) times daily as needed for muscle spasms. 11/06/17 02/04/18 Yes King, Diona Foley, NP  DULoxetine (CYMBALTA) 30 MG capsule Take 30 mg by mouth daily.   Yes [provider]  gabapentin (NEURONTIN) 300 MG capsule Take 1 capsule (300 mg total) by mouth 3 (three) times daily. 11/06/17 02/04/18 Yes King, Diona Foley, NP  hydroxychloroquine (PLAQUENIL) 200 MG tablet Take by mouth. 09/23/14  Yes [provider]  oxyCODONE (OXY IR/ROXICODONE) 5 MG immediate release tablet Take 1 tablet (5 mg total) by mouth 2 (two) times daily. 01/05/18 02/04/18 Yes King, Diona Foley, NP  pantoprazole (PROTONIX) 40 MG tablet Take 40 mg by mouth daily.   Yes [provider]  azithromycin (ZITHROMAX) 250 MG tablet Take 1 tablet (250 mg total) by mouth daily. Take first 2 tablets together, then 1 every day until finished. 01/16/18   Enrique Sack, FNP  DULoxetine (CYMBALTA) 30 MG capsule TAKE 1 CAPSULE(30 MG) BY MOUTH EVERY DAY 10/18/15 12/04/17  [provider]  guaiFENesin-codeine (CHERATUSSIN AC) 100-10 MG/5ML syrup Take 5 mLs by mouth every 4 (four) hours as needed for cough. 01/16/18   Enrique Sack, FNP  nicotine (NICODERM CQ - DOSED IN MG/24 HOURS) 21 mg/24hr patch Place 21 mg onto the skin daily.    [provider]  oxyCODONE (OXY IR/ROXICODONE) 5 MG immediate release tablet Take 1 tablet (5 mg total) by mouth 2 (two) times daily. 12/06/17 01/05/18  Vevelyn Francois, NP  oxyCODONE (OXY IR/ROXICODONE) 5 MG immediate release tablet Take 1 tablet (5 mg total) by mouth 2 (two) times daily. 11/06/17 12/06/17  Vevelyn Francois, NP  predniSONE (DELTASONE) 20 MG tablet Take 2 tablets (40 mg total) by mouth daily for 5 days. 01/16/18 01/21/18  Enrique Sack, FNP    Family  History Family History  Problem Relation Age of Onset  . Cancer Mother   . Hypertension Mother   . Emphysema Mother   . Stroke Mother   . Glaucoma Father   . Heart disease Father   . Diabetes Brother     Social History Social History   Tobacco Use  . Smoking status: Current Every Day Smoker    Packs/day: 0.50    Years: 35.00    Pack years: 17.50    Types: Cigarettes  . Smokeless tobacco: Never Used  Substance Use Topics  . Alcohol use: No  . Drug use: No     Allergies   Trazodone; Bupropion; Methocarbamol; Oxycodone-acetaminophen; Tramadol; and Trazodone and nefazodone   Review of Systems Review of Systems  Constitutional: Positive for fever.  HENT: Positive for congestion.   Eyes: Negative.   Respiratory: Positive for cough, chest tightness, shortness of breath and wheezing.   Gastrointestinal: Negative.   Neurological: Positive for headaches.  All other systems reviewed and are negative.    Physical Exam Triage Vital Signs ED Triage Vitals  Enc Vitals Group     BP 01/16/18 1806 100/77     Pulse Rate 01/16/18 1806 (!) 102     Resp 01/16/18 1806 20  Temp 01/16/18 1806 98.9 F (37.2 C)     Temp Source 01/16/18 1806 Oral     SpO2 01/16/18 1806 98 %     Weight 01/16/18 1807 145 lb (65.8 kg)     Height 01/16/18 1807 5\' 3"  (1.6 m)     Head Circumference --      Peak Flow --      Pain Score 01/16/18 1807 0     Pain Loc --      Pain Edu? --      Excl. in [place]? --    No data found.  Updated Vital Signs BP 100/77 (BP Location: Left Arm)   Pulse (!) 102   Temp 98.9 F (37.2 C) (Oral)   Resp 20   Ht 5\' 3"  (1.6 m)   Wt 145 lb (65.8 kg)   SpO2 98%   BMI 25.69 kg/m   Visual Acuity Right Eye Distance:   Left Eye Distance:   Bilateral Distance:    Right Eye Near:   Left Eye Near:    Bilateral Near:     Physical Exam Constitutional:      General: She is not in acute distress.    Appearance: Normal appearance. She is ill-appearing. She is not  toxic-appearing.  HENT:     Head: Normocephalic.     Nose: Nose normal.     Mouth/Throat:     Mouth: Mucous membranes are moist.     Pharynx: Oropharynx is clear.  Neck:     Musculoskeletal: Normal range of motion.  Cardiovascular:     Rate and Rhythm: Normal rate and regular rhythm.     Pulses: Normal pulses.     Heart sounds: Normal heart sounds.  Pulmonary:     Effort: Pulmonary effort is normal. No respiratory distress.     Breath sounds: Normal breath sounds. No wheezing.  Musculoskeletal: Normal range of motion.  Lymphadenopathy:     Cervical: No cervical adenopathy.  Skin:    General: Skin is warm and dry.  Neurological:     General: No focal deficit present.     Mental Status: She is alert and oriented to person, place, and time.  Psychiatric:        Mood and Affect: Mood normal.        Behavior: Behavior normal.      UC Treatments / Results  Labs (all labs ordered are listed, but only abnormal results are displayed) Labs Reviewed - No data to display  EKG None  Radiology No results found.  Procedures Procedures (including critical care time)  Medications Ordered in UC Medications - No data to display  Initial Impression / Assessment and Plan / UC Course  I have reviewed the triage vital signs and the nursing notes.  Pertinent labs & imaging results that were available during my care of the patient were reviewed by me and considered in my medical decision making (see chart for details).     57 year old female with a five-day history of gradually worsening cough with wheezing, shortness of breath, pleuritic chest pain subjective fevers.  Patient is afebrile.  Vital signs stable.  She looks as though she does not feel well but is nontoxic-appearing.  Physical exam unremarkable. Antibiotics, antitussives and steroids per medication orders.Smoking cessation highly encouraged. Go to ED immediately if shortness of breath worsens, blood in sputum, change in  character of cough, development of fever or chills or inability to maintain nutrition and hydration.   Today's evaluation has revealed  no signs of a dangerous process. Discussed diagnosis with patient. Patient aware of their diagnosis, possible red flag symptoms to watch out for and need for close follow up. Patient understands verbal and written discharge instructions. Patient comfortable with plan and disposition.  Patient has a clear mental status at this time, good insight into illness (after discussion and teaching) and has clear judgment to make decisions regarding their care.  Documentation was completed with the aid of voice recognition software. Transcription may contain typographical errors. Final Clinical Impressions(s) / UC Diagnoses   Final diagnoses:  Bronchitis     Discharge Instructions     Take medications as prescribed. Start Mucinex DM twice daily.  Medication is available over-the-counter.  Drink plenty of fluids.  Tylenol or ibuprofen as needed.  Stop smoking.    ED Prescriptions    Medication Sig Dispense Auth. Provider   azithromycin (ZITHROMAX) 250 MG tablet Take 1 tablet (250 mg total) by mouth daily. Take first 2 tablets together, then 1 every day until finished. 6 tablet Enrique Sack, FNP   predniSONE (DELTASONE) 20 MG tablet Take 2 tablets (40 mg total) by mouth daily for 5 days. 10 tablet Kiamesha Samet, [place], FNP   guaiFENesin-codeine (CHERATUSSIN AC) 100-10 MG/5ML syrup Take 5 mLs by mouth every 4 (four) hours as needed for cough. 120 mL Enrique Sack, FNP     Controlled Substance Prescriptions [place] Controlled Substance Registry consulted? Not Applicable   Enrique Sack, [place] 01/16/18 1900

## 2018-01-16 NOTE — ED Triage Notes (Signed)
Patient c/o cough and chest congestion x 1 week. Patient states she had a fever last night of 102.0.

## 2018-02-04 ENCOUNTER — Encounter: Payer: Medicaid Other | Admitting: Nurse Practitioner

## 2018-02-09 ENCOUNTER — Other Ambulatory Visit: Payer: Self-pay | Admitting: Nurse Practitioner

## 2018-02-09 DIAGNOSIS — M7918 Myalgia, other site: Secondary | ICD-10-CM

## 2018-02-09 DIAGNOSIS — M797 Fibromyalgia: Secondary | ICD-10-CM

## 2018-02-16 ENCOUNTER — Other Ambulatory Visit: Payer: Self-pay

## 2018-02-16 ENCOUNTER — Encounter: Payer: Self-pay | Admitting: Nurse Practitioner

## 2018-02-16 ENCOUNTER — Ambulatory Visit: Payer: Medicaid Other | Attending: Nurse Practitioner | Admitting: Nurse Practitioner

## 2018-02-16 VITALS — BP 132/84 | HR 77 | Temp 97.4°F | Ht 63.0 in | Wt 145.0 lb

## 2018-02-16 DIAGNOSIS — G894 Chronic pain syndrome: Secondary | ICD-10-CM | POA: Diagnosis present

## 2018-02-16 DIAGNOSIS — M7918 Myalgia, other site: Secondary | ICD-10-CM | POA: Diagnosis not present

## 2018-02-16 DIAGNOSIS — M5481 Occipital neuralgia: Secondary | ICD-10-CM | POA: Insufficient documentation

## 2018-02-16 DIAGNOSIS — M47812 Spondylosis without myelopathy or radiculopathy, cervical region: Secondary | ICD-10-CM | POA: Insufficient documentation

## 2018-02-16 DIAGNOSIS — M797 Fibromyalgia: Secondary | ICD-10-CM

## 2018-02-16 MED ORDER — CYCLOBENZAPRINE HCL 10 MG PO TABS: 10.0000 mg | ORAL_TABLET | Freq: Three times a day (TID) | ORAL | 0 refills | Status: DC | PRN

## 2018-02-16 MED ORDER — GABAPENTIN 300 MG PO CAPS: 300.0000 mg | ORAL_CAPSULE | Freq: Three times a day (TID) | ORAL | 0 refills | Status: DC

## 2018-02-16 MED ORDER — OXYCODONE HCL 5 MG PO TABS: 5.0000 mg | ORAL_TABLET | Freq: Two times a day (BID) | ORAL | 0 refills | Status: DC

## 2018-02-16 NOTE — Patient Instructions (Signed)
____________________________________________________________________________________________  Medication Rules  Purpose: To inform patients, and their family members, of our rules and regulations.  Applies to: All patients receiving prescriptions (written or electronic).  Pharmacy of record: Pharmacy where electronic prescriptions will be sent. If written prescriptions are taken to a different pharmacy, please inform the nursing staff. The pharmacy listed in the electronic medical record should be the one where you would like electronic prescriptions to be sent.  Electronic prescriptions: In compliance with the [place] Strengthen Opioid Misuse Prevention (STOP) Act of 2017 (Session Law 2017-74/H243), effective January 21, 2018, all controlled substances must be electronically prescribed. Calling prescriptions to the pharmacy will cease to exist.  Prescription refills: Only during scheduled appointments. Applies to all prescriptions.  NOTE: The following applies primarily to controlled substances (Opioid* Pain Medications).   Patient's responsibilities: 1. Pain Pills: Bring all pain pills to every appointment (except for procedure appointments). 2. Pill Bottles: Bring pills in original pharmacy bottle. Always bring the newest bottle. Bring bottle, even if empty. 3. Medication refills: You are responsible for knowing and keeping track of what medications you take and those you need refilled. The day before your appointment: write a list of all prescriptions that need to be refilled. The day of the appointment: give the list to the admitting nurse. Prescriptions will be written only during appointments. If you forget a medication: it will not be "Called in", "Faxed", or "electronically sent". You will need to get another appointment to get these prescribed. No early refills. Do not call asking to have your prescription filled early. 4. Prescription Accuracy: You are responsible for  carefully inspecting your prescriptions before leaving our office. Have the discharge nurse carefully go over each prescription with you, before taking them home. Make sure that your name is accurately spelled, that your address is correct. Check the name and dose of your medication to make sure it is accurate. Check the number of pills, and the written instructions to make sure they are clear and accurate. Make sure that you are given enough medication to last until your next medication refill appointment. 5. Taking Medication: Take medication as prescribed. When it comes to controlled substances, taking less pills or less frequently than prescribed is permitted and encouraged. Never take more pills than instructed. Never take medication more frequently than prescribed.  6. Inform other Doctors: Always inform, all of your healthcare providers, of all the medications you take. 7. Pain Medication from other Providers: You are not allowed to accept any additional pain medication from any other Doctor or Healthcare provider. There are two exceptions to this rule. (see below) In the event that you require additional pain medication, you are responsible for notifying us, as stated below. 8. Medication Agreement: You are responsible for carefully reading and following our Medication Agreement. This must be signed before receiving any prescriptions from our practice. Safely store a copy of your signed Agreement. Violations to the Agreement will result in no further prescriptions. (Additional copies of our Medication Agreement are available upon request.) 9. Laws, Rules, & Regulations: All patients are expected to follow all Federal and State Laws, Statutes, Rules, & Regulations. Ignorance of the Laws does not constitute a valid excuse. The use of any illegal substances is prohibited. 10. Adopted CDC guidelines & recommendations: Target dosing levels will be at or below 60 MME/day. Use of benzodiazepines** is not  recommended.  Exceptions: There are only two exceptions to the rule of not receiving pain medications from other Healthcare Providers. 1.   Exception #1 (Emergencies): In the event of an emergency (i.e.: accident requiring emergency care), you are allowed to receive additional pain medication. However, you are responsible for: As soon as you are able, call our office [phone], at any time of the day or night, and leave a message stating your name, the date and nature of the emergency, and the name and dose of the medication prescribed. In the event that your call is answered by a member of our staff, make sure to document and save the date, time, and the name of the person that took your information.  2. Exception #2 (Planned Surgery): In the event that you are scheduled by another doctor or dentist to have any type of surgery or procedure, you are allowed (for a period no longer than 30 days), to receive additional pain medication, for the acute post-op pain. However, in this case, you are responsible for picking up a copy of our "Post-op Pain Management for Surgeons" handout, and giving it to your surgeon or dentist. This document is available at our office, and does not require an appointment to obtain it. Simply go to our office during business hours (Monday-Thursday from 8:00 AM to 4:00 PM) (Friday 8:00 AM to 12:00 Noon) or if you have a scheduled appointment with us, prior to your surgery, and ask for it by name. In addition, you will need to provide us with your name, name of your surgeon, type of surgery, and date of procedure or surgery.  *Opioid medications include: morphine, codeine, oxycodone, oxymorphone, hydrocodone, hydromorphone, meperidine, tramadol, tapentadol, buprenorphine, fentanyl, methadone. **Benzodiazepine medications include: diazepam (Valium), alprazolam (Xanax), clonazepam (Klonopine), lorazepam (Ativan), clorazepate (Tranxene), chlordiazepoxide (Librium), estazolam (Prosom),  oxazepam (Serax), temazepam (Restoril), triazolam (Halcion) (Last updated: 03/20/2017) ____________________________________________________________________________________________    

## 2018-02-16 NOTE — Progress Notes (Signed)
Patient's Name: Tanya [name]  MRN: [id number]  Referring Provider: Ricardo Jericho*  DOB: Mar 30, 1960  PCP: Ricardo Jericho, NP  DOS: 02/16/2018  Note by: Vevelyn Francois NP  Service setting: Ambulatory outpatient  Specialty: Interventional Pain Management  Location: [facility] (AMB) Pain Management Facility    Patient type: Established    Primary Reason(s) for Visit: Encounter for prescription drug management & post-procedure evaluation of chronic illness with mild to moderate exacerbation(Level of risk: moderate) CC: Back Pain  HPI  Tanya [name] is a 58 y.o. year old, female patient, who comes today for a post-procedure evaluation and medication management. She has Anxiety; Chronic upper back pain (Secondary source of pain) (Bilateral) (R>L); Chronic discoid lupus erythematosus; Chronic low back pain [facility] source of pain) (Bilateral) (R>L); Chronic neck pain (Primary Source of Pain) (Bilateral) (R>L); Discoid lupus; Diverticulitis; Encounter for long-term (current) use of high-risk medication; Fibromyalgia; Generalized anxiety disorder; Hypercholesterolemia; Hyperlipidemia; Major depressive disorder, recurrent ([place]); Pain medication agreement signed; Osteoarthritis; SLE (systemic lupus erythematosus) ([place]); Urinary incontinence; Long term prescription benzodiazepine use; Long term current use of opiate analgesic; Long term prescription opiate use; Opiate use (10 MME/Day); Chronic pain syndrome; Chronic upper extremity pain (Right); Chronic cervical radicular pain (Right); Chronic shoulder pain (Bilateral) (R>L); 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 Her primarily concern today is the Back Pain  Pain Assessment: Location: Upper(neck) Back Radiating: pain radiatiestoward shoulder Onset: More than a month ago Duration: Chronic pain Quality: Dull, Aching Severity: 4 /10 (subjective, self-reported pain score)  Note: Reported level is compatible with observation.                          Effect on ADL: limits my daily activities Timing: Intermittent Modifying factors: rest and medications BP: 132/84  HR: 77  Tanya [name] was last seen on 12/25/2017 for a procedure. During today's appointment we reviewed Tanya [name]'s post-procedure results, as well as her outpatient medication regimen. She continues to have the burning pain in her scalp from the RFA.   Further details on both, my assessment(s), as well as the proposed treatment plan, please see below.  Controlled Substance Pharmacotherapy Assessment REMS (Risk Evaluation and Mitigation Strategy)  Analgesic: Oxycodone 5 mg 1 tablet twice daily MME/day: 15 mg/day.  Chauncey Fischer, RN  02/16/2018  8:14 AM  Sign when Signing Visit Nursing Pain Medication Assessment:  Safety precautions to be maintained throughout the outpatient stay will include: orient to surroundings, keep bed in low position, maintain call bell within reach at all times, provide assistance with transfer out of bed and ambulation.  Medication Inspection Compliance: Ms. Delpozo did not comply with our request to bring her pills to be counted. She was reminded that bringing the medication bottles, even when empty, is a requirement.  Medication: None brought in. Pill/Patch Count: None available to be counted. Bottle Appearance: No container available. Did not bring bottle(s) to appointment. Filled Date: N/A Last Medication intake:  02/07/18   Pharmacokinetics: Liberation and absorption (onset of action):  WNL Distribution (time to peak effect): WNL Metabolism and excretion (duration of action): WNL         Pharmacodynamics: Desired effects: Analgesia: Tanya [name]  reports >50% benefit. Functional ability: Patient reports that medication allows her to accomplish basic ADLs Clinically meaningful improvement in function (CMIF): Sustained CMIF goals met Perceived effectiveness: Described as relatively effective, allowing for increase in activities of daily living (ADL) Undesirable effects: Side-effects or Adverse reactions: None reported Monitoring:  PMP: Online review of the past 25-monthperiod conducted. Compliant with practice rules and regulations Last UDS on record: Summary  Date Value Ref Range Status  11/06/2017 FINAL  Final    Comment:    ==================================================================== TOXASSURE SELECT 13 (MW) ==================================================================== Test                             Result       Flag       Units Drug Present and Declared for Prescription Verification   Oxycodone                      340          EXPECTED   ng/mg creat   Oxymorphone                    537          EXPECTED   ng/mg creat   Noroxycodone                   921          EXPECTED   ng/mg creat   Noroxymorphone                 298          EXPECTED   ng/mg creat    Sources of oxycodone are scheduled prescription medications.    Oxymorphone, noroxycodone, and noroxymorphone are expected    metabolites of oxycodone. Oxymorphone is also available as a    scheduled prescription medication. ==================================================================== Test                      Result    Flag   Units      Ref Range   Creatinine              57               mg/dL      >=20 ==================================================================== Declared Medications:  The flagging and interpretation on this report are based on the  following declared  medications.  Unexpected results may arise from  inaccuracies in the declared medications.  **Note: The testing scope of this panel includes these medications:  Oxycodone (Roxicodone)  **Note: The testing scope of this panel does not include following  reported medications:  Albuterol  Cyclobenzaprine (Flexeril)  Duloxetine (Cymbalta)  Gabapentin (Neurontin)  Hydroxychloroquine (Plaquenil)  Nicotine (Nicoderm)  Pantoprazole (Protonix) ==================================================================== For clinical consultation, please call [phone] ====================================================================    UDS interpretation: Compliant          Medication Assessment Form: Reviewed. Patient indicates being compliant with therapy Treatment compliance: Deficiencies noted and steps taken to remind the patient of the seriousness of adequate therapy compliance Risk Assessment Profile: Aberrant behavior: failure to bring medications for pill counts, and missing appointments  Comorbid factors increasing risk of overdose: See prior notes. No additional risks detected today Opioid risk tool (ORT) (Total Score): 1 Personal History of Substance Abuse (SUD-Substance use disorder):  Alcohol: Negative  Illegal Drugs: Negative  Rx Drugs: Negative  ORT Risk Level calculation: Low Risk  Risk of substance use disorder (SUD): Moderate Opioid Risk Tool - 02/16/18 3202      Family History of Substance Abuse   Alcohol  Negative    Illegal Drugs  Negative    Rx Drugs  Negative      Personal History of Substance Abuse   Alcohol  Negative    Illegal Drugs  Negative    Rx Drugs  Negative      Age   Age between 42-45 years   No      History of Preadolescent Sexual Abuse   History of Preadolescent Sexual Abuse  Negative or Female      Psychological Disease   Psychological Disease  Negative    Depression  Positive      Total Score   Opioid Risk Tool Scoring  1    Opioid  Risk Interpretation  Low Risk      ORT Scoring interpretation table:  Score <3 = Low Risk for SUD  Score between 4-7 = Moderate Risk for SUD  Score >8 = High Risk for Opioid Abuse   Risk Mitigation Strategies:  Patient Counseling: Covered Patient-Prescriber Agreement (PPA): Present and active  Notification to other healthcare providers: Done  Pharmacologic Plan: No change in therapy, at this time.             Post-Procedure Assessment  12/25/2017 procedure: Diagnostic left occipital nerve block Pre-procedure pain score:  8/10 Post-procedure pain score: 0/10         Influential Factors: BMI: 25.69 kg/m Intra-procedural challenges: None observed.         Assessment challenges: None detected.              Reported side-effects: None.        Post-procedural adverse reactions or complications: None reported         Sedation: Please see nurses note. When no sedatives are used, the analgesic levels obtained are directly associated to the effectiveness of the local anesthetics. However, when sedation is provided, the level of analgesia obtained during the initial 1 hour following the intervention, is believed to be the result of a combination of factors. These factors may include, but are not limited to: 1. The effectiveness of the local anesthetics used. 2. The effects of the analgesic(s) and/or anxiolytic(s) used. 3. The degree of discomfort experienced by the patient at the time of the procedure. 4. The patients ability and reliability in recalling and recording the events. 5. The presence and influence of possible secondary gains and/or psychosocial factors. Reported result: Relief experienced during the 1st hour after the procedure: 100 % (Ultra-Short Term Relief)            Interpretative annotation: Clinically appropriate result. Analgesia during this period is likely to be Local Anesthetic and/or IV Sedative (Analgesic/Anxiolytic) related.          Effects of local anesthetic: The  analgesic effects attained during this period are directly associated to the localized infiltration of local anesthetics and therefore cary significant diagnostic value as to the etiological location, or anatomical origin, of the pain. Expected duration of relief is directly dependent on the pharmacodynamics of the local anesthetic used. Long-acting (4-6 hours) anesthetics used.  Reported result: Relief during the next 4 to 6 hour after the procedure: 100 % (Short-Term Relief)            Interpretative annotation: Clinically appropriate result. Analgesia during this period is likely to be Local Anesthetic-related.  Long-term benefit: Defined as the period of time past the expected duration of local anesthetics (1 hour for short-acting and 4-6 hours for long-acting). With the possible exception of prolonged sympathetic blockade from the local anesthetics, benefits during this period are typically attributed to, or associated with, other factors such as analgesic sensory neuropraxia, antiinflammatory effects, or beneficial biochemical changes provided by agents other than the local anesthetics.  Reported result: Extended relief following procedure: 100 %(for 1 week) (Long-Term Relief)            Interpretative annotation: Clinically possible results. Good relief. No permanent benefit expected. Inflammation plays a part in the etiology to the pain.          Current benefits: Defined as reported results that persistent at this point in time.   Analgesia: >50 % Ms. Wahlert reports improvement of axial symptoms. Function: Somewhat improved ROM: Somewhat improved Interpretative annotation: Ongoing benefit.                Interpretation: Results would suggest a successful diagnostic intervention.                  Plan:  Please see "Plan of Care" for details.                Laboratory Chemistry  Inflammation Markers (CRP: Acute Phase) (ESR: Chronic Phase) Lab Results  Component Value Date    CRP 2 09/03/2017   ESRSEDRATE 21 09/03/2017                         Rheumatology Markers No results found for: RF, ANA, LABURIC, URICUR, LYMEIGGIGMAB, LYMEABIGMQN, HLAB27                      Renal Function Markers Lab Results  Component Value Date   BUN 10 09/03/2017   CREATININE 0.67 09/03/2017   BCR 15 09/03/2017   GFRAA 114 09/03/2017   GFRNONAA 99 09/03/2017                             Hepatic Function Markers Lab Results  Component Value Date   AST 15 09/03/2017   ALT 16 09/08/2016   ALBUMIN 4.4 09/03/2017   ALKPHOS 30 (L) 09/03/2017   LIPASE 37 09/08/2016                        Electrolytes Lab Results  Component Value Date   NA 137 09/03/2017   K 4.2 09/03/2017   CL 101 09/03/2017   CALCIUM 9.4 09/03/2017   MG 1.9 09/03/2017                        Neuropathy Markers Lab Results  Component Value Date   VITAMINB12 296 09/03/2017                        CNS Tests No results found for: COLORCSF, APPEARCSF, RBCCOUNTCSF, WBCCSF, POLYSCSF, LYMPHSCSF, EOSCSF, PROTEINCSF, GLUCCSF, JCVIRUS, CSFOLI, IGGCSF                      Bone Pathology Markers Lab Results  Component Value Date   25OHVITD1 25 (L) 09/03/2017   25OHVITD2 2.9 09/03/2017   25OHVITD3 22 09/03/2017  Coagulation Parameters Lab Results  Component Value Date   PLT 375 09/08/2016                        Cardiovascular Markers Lab Results  Component Value Date   HGB 13.4 09/08/2016   HCT 40.0 09/08/2016                         CA Markers No results found for: CEA, CA125, LABCA2                      Note: Lab results reviewed.  Recent Diagnostic Imaging Results  DG C-Arm 1-60 Min-No Report Fluoroscopy was utilized by the requesting physician.  No radiographic  interpretation.   Complexity Note: Imaging results reviewed. Results shared with Ms. Yokum, using Layman's terms.                         Meds   Current Outpatient Medications:  .  DULoxetine  (CYMBALTA) 30 MG capsule, Take 30 mg by mouth daily., Disp: , Rfl:  .  gabapentin (NEURONTIN) 600 MG tablet, Take 600 mg by mouth 3 (three) times daily., Disp: , Rfl:  .  hydroxychloroquine (PLAQUENIL) 200 MG tablet, Take by mouth., Disp: , Rfl:  .  pantoprazole (PROTONIX) 40 MG tablet, Take 40 mg by mouth daily., Disp: , Rfl:  .  cyclobenzaprine (FLEXERIL) 10 MG tablet, Take 1 tablet (10 mg total) by mouth 3 (three) times daily as needed for muscle spasms., Disp: 90 tablet, Rfl: 0 .  DULoxetine (CYMBALTA) 30 MG capsule, TAKE 1 CAPSULE(30 MG) BY MOUTH EVERY DAY, Disp: , Rfl:  .  gabapentin (NEURONTIN) 300 MG capsule, Take 1 capsule (300 mg total) by mouth 3 (three) times daily for 30 days., Disp: 90 capsule, Rfl: 0 .  oxyCODONE (OXY IR/ROXICODONE) 5 MG immediate release tablet, Take 1 tablet (5 mg total) by mouth 2 (two) times daily., Disp: 60 tablet, Rfl: 0 .  oxyCODONE (OXY IR/ROXICODONE) 5 MG immediate release tablet, Take 1 tablet (5 mg total) by mouth 2 (two) times daily., Disp: 60 tablet, Rfl: 0 .  oxyCODONE (OXY IR/ROXICODONE) 5 MG immediate release tablet, Take 1 tablet (5 mg total) by mouth 2 (two) times daily for 30 days., Disp: 60 tablet, Rfl: 0  ROS  Constitutional: Denies any fever or chills Gastrointestinal: No reported hemesis, hematochezia, vomiting, or acute GI distress Musculoskeletal: Denies any acute onset joint swelling, redness, loss of ROM, or weakness Neurological: No reported episodes of acute onset apraxia, aphasia, dysarthria, agnosia, amnesia, paralysis, loss of coordination, or loss of consciousness  Allergies  Ms. Harmes is allergic to trazodone; bupropion; methocarbamol; oxycodone-acetaminophen; tramadol; and trazodone and nefazodone.  [place]  Drug: Ms. Basto  reports no history of drug use. Alcohol:  reports no history of alcohol use. Tobacco:  reports that she has been smoking cigarettes. She has a 17.50 pack-year smoking history. She has never used  smokeless tobacco. Medical:  has a past medical history of Anxiety, Bladder infection, Chronic lower back pain, Chronic neck pain, Depression, Diverticulitis, Hyperlipidemia, Lupus ([place]), and Overactive bladder. Surgical: Ms. Vitullo  has a past surgical history that includes Abdominal hysterectomy; Colonoscopy; Colonoscopy with propofol (N/A, 07/03/2017); Tonsillectomy; Esophagogastroduodenoscopy (N/A, 10/21/2017); and Colonoscopy with propofol (N/A, 10/21/2017). Family: family history includes Cancer in her mother; Diabetes in her brother; Emphysema in her mother; Glaucoma in her father; Heart disease in  her father; Hypertension in her mother; Stroke in her mother.  Constitutional Exam  General appearance: Well nourished, well developed, and well hydrated. In no apparent acute distress Vitals:   02/16/18 0814  BP: 132/84  Pulse: 77  Temp: (!) 97.4 F (36.3 C)  SpO2: 100%  Weight: 145 lb (65.8 kg)  Height: 5' 3"  (1.6 m)  Psych/Mental status: Alert, oriented x 3 (person, place, & time)       Eyes: PERLA Respiratory: No evidence of acute respiratory distress  Cervical Spine Area Exam  Skin & Axial Inspection: No masses, redness, edema, swelling, or associated skin lesions Alignment: Symmetrical Functional ROM: Unrestricted ROM      Stability: No instability detected Muscle Tone/Strength: Functionally intact. No obvious neuro-muscular anomalies detected. Sensory (Neurological): Unimpaired Palpation: No palpable anomalies            Tenderness to scalp on left   Upper Extremity (UE) Exam    Side: Right upper extremity  Side: Left upper extremity  Skin & Extremity Inspection: Skin color, temperature, and hair growth are WNL. No peripheral edema or cyanosis. No masses, redness, swelling, asymmetry, or associated skin lesions. No contractures.  Skin & Extremity Inspection: Skin color, temperature, and hair growth are WNL. No peripheral edema or cyanosis. No masses, redness, swelling,  asymmetry, or associated skin lesions. No contractures.  Functional ROM: Unrestricted ROM          Functional ROM: Unrestricted ROM          Muscle Tone/Strength: Functionally intact. No obvious neuro-muscular anomalies detected.  Muscle Tone/Strength: Functionally intact. No obvious neuro-muscular anomalies detected.  Sensory (Neurological): Unimpaired          Sensory (Neurological): Unimpaired          Palpation: No palpable anomalies              Palpation: No palpable anomalies                   Thoracic Spine Area Exam  Skin & Axial Inspection: No masses, redness, or swelling Alignment: Symmetrical Functional ROM: Unrestricted ROM Stability: No instability detected Muscle Tone/Strength: Functionally intact. No obvious neuro-muscular anomalies detected. Sensory (Neurological): Unimpaired Muscle strength & Tone: No palpable anomalies  Gait & Posture Assessment  Ambulation: Unassisted Gait: Relatively normal for age and body habitus Posture: WNL   Assessment  Primary Diagnosis & Pertinent Problem List: The primary encounter diagnosis was Cervical spondylosis. Diagnoses of Cervico-occipital neuralgia (Left), Musculoskeletal pain, Fibromyalgia, and Chronic pain syndrome were also pertinent to this visit.  Status Diagnosis  Responding Responding Controlled 1. Cervical spondylosis   2. Cervico-occipital neuralgia (Left)   3. Musculoskeletal pain   4. Fibromyalgia   5. Chronic pain syndrome     Problems updated and reviewed during this visit: No problems updated. Plan of Care  Pharmacotherapy (Medications Ordered): Meds ordered this encounter  Medications  . DISCONTD: cyclobenzaprine (FLEXERIL) 10 MG tablet    Sig: Take 1 tablet (10 mg total) by mouth 3 (three) times daily as needed for muscle spasms.    Dispense:  90 tablet    Refill:  0    Do not add this medication to the electronic "Automatic Refill" notification system. Patient may have prescription filled one day early  if pharmacy is closed on scheduled refill date.    Order Specific Question:   Supervising Provider    Answer:   Milinda Pointer [phone]  . DISCONTD: gabapentin (NEURONTIN) 300 MG capsule    Sig: Take  1 capsule (300 mg total) by mouth 3 (three) times daily for 30 days.    Dispense:  90 capsule    Refill:  0    Do not add this medication to the electronic "Automatic Refill" notification system. Patient may have prescription filled one day early if pharmacy is closed on scheduled refill date.    Order Specific Question:   Supervising Provider    Answer:   Milinda Pointer [phone]  . DISCONTD: oxyCODONE (OXY IR/ROXICODONE) 5 MG immediate release tablet    Sig: Take 1 tablet (5 mg total) by mouth 2 (two) times daily for 30 days.    Dispense:  60 tablet    Refill:  0    Do not place this medication, or any other prescription from our practice, on "Automatic Refill". Patient may have prescription filled one day early if pharmacy is closed on scheduled refill date.    Order Specific Question:   Supervising Provider    Answer:   Milinda Pointer [phone]  . oxyCODONE (OXY IR/ROXICODONE) 5 MG immediate release tablet    Sig: Take 1 tablet (5 mg total) by mouth 2 (two) times daily for 30 days.    Dispense:  60 tablet    Refill:  0    Do not place this medication, or any other prescription from our practice, on "Automatic Refill". Patient may have prescription filled one day early if pharmacy is closed on scheduled refill date.    Order Specific Question:   Supervising Provider    Answer:   Milinda Pointer [phone]  . gabapentin (NEURONTIN) 300 MG capsule    Sig: Take 1 capsule (300 mg total) by mouth 3 (three) times daily for 30 days.    Dispense:  90 capsule    Refill:  0    Do not add this medication to the electronic "Automatic Refill" notification system. Patient may have prescription filled one day early if pharmacy is closed on scheduled refill date.    Order Specific Question:    Supervising Provider    Answer:   Milinda Pointer [phone]  . cyclobenzaprine (FLEXERIL) 10 MG tablet    Sig: Take 1 tablet (10 mg total) by mouth 3 (three) times daily as needed for muscle spasms.    Dispense:  90 tablet    Refill:  0    Do not add this medication to the electronic "Automatic Refill" notification system. Patient may have prescription filled one day early if pharmacy is closed on scheduled refill date.    Order Specific Question:   Supervising Provider    Answer:   Milinda Pointer [[id number]]   New Prescriptions   No medications on file   Medications administered today: Vicci L. Holtzclaw had no medications administered during this visit. Lab-work, procedure(s), and/or referral(s): No orders of the defined types were placed in this encounter.  Imaging and/or referral(s): None  Interventional management options: Planned, scheduled, and/or pending:   Not at this time.   Considering:   Diagnostic bilateral intra-articular shoulder joint injection Diagnostic bilateral suprascapular nerve block Possible bilateral suprascapular nerve RFA Right-sided cervical epidural steroid injection(Series #2) Diagnostic bilateral cervical facet block Possible bilateral cervical facetRFA Diagnostic bilateral lumbar facet block Possible bilateral lumbar facetRFA   Palliative PRN treatment(s):   Palliative right-sided cervicalESI(Series #2) Palliative right-sided cervical facet RFA#3(Last done on: 10/23/17) Palliative left-sided cervical facet RFA #2 (Last done on: 06/26/2016)    Provider-requested follow-up: Return in about 4 weeks (around 03/16/2018) for MedMgmt.  Future Appointments  Date Time Provider [place]  03/17/2018  8:15 AM Vevelyn Francois, NP [facility] None   Primary Care Physician: Ricardo Jericho, NP Location: [facility] Outpatient Pain Management Facility Note by: Vevelyn Francois NP Date: 02/16/2018; Time: 8:58 AM  Pain Score  Disclaimer: We use the NRS-11 scale. This is a self-reported, subjective measurement of pain severity with only modest accuracy. It is used primarily to identify changes within a particular patient. It must be understood that outpatient pain scales are significantly less accurate that those used for research, where they can be applied under ideal controlled circumstances with minimal exposure to variables. In reality, the score is likely to be a combination of pain intensity and pain affect, where pain affect describes the degree of emotional arousal or changes in action readiness caused by the sensory experience of pain. Factors such as social and work situation, setting, emotional state, anxiety levels, expectation, and prior pain experience may influence pain perception and show large inter-individual differences that may also be affected by time variables.  Patient instructions provided during this appointment: Patient Instructions  ____________________________________________________________________________________________  Medication Rules  Purpose: To inform patients, and their family members, of our rules and regulations.  Applies to: All patients receiving prescriptions (written or electronic).  Pharmacy of record: Pharmacy where electronic prescriptions will be sent. If written prescriptions are taken to a different pharmacy, please inform the nursing staff. The pharmacy listed in the electronic medical record should be the one where you would like electronic prescriptions to be sent.  Electronic prescriptions: In compliance with the [place] (STOP) Act of 2017 (Session Lanny Cramp [phone]), effective January 21, 2018, all controlled substances must be electronically prescribed. Calling prescriptions to the pharmacy will cease to exist.  Prescription refills: Only during scheduled appointments. Applies to all prescriptions.  NOTE: The following  applies primarily to controlled substances (Opioid* Pain Medications).   Patient's responsibilities: 1. Pain Pills: Bring all pain pills to every appointment (except for procedure appointments). 2. Pill Bottles: Bring pills in original pharmacy bottle. Always bring the newest bottle. Bring bottle, even if empty. 3. Medication refills: You are responsible for knowing and keeping track of what medications you take and those you need refilled. The day before your appointment: write a list of all prescriptions that need to be refilled. The day of the appointment: give the list to the admitting nurse. Prescriptions will be written only during appointments. If you forget a medication: it will not be "Called in", "Faxed", or "electronically sent". You will need to get another appointment to get these prescribed. No early refills. Do not call asking to have your prescription filled early. 4. Prescription Accuracy: You are responsible for carefully inspecting your prescriptions before leaving our office. Have the discharge nurse carefully go over each prescription with you, before taking them home. Make sure that your name is accurately spelled, that your address is correct. Check the name and dose of your medication to make sure it is accurate. Check the number of pills, and the written instructions to make sure they are clear and accurate. Make sure that you are given enough medication to last until your next medication refill appointment. 5. Taking Medication: Take medication as prescribed. When it comes to controlled substances, taking less pills or less frequently than prescribed is permitted and encouraged. Never take more pills than instructed. Never take medication more frequently than prescribed.  6. Inform other Doctors: Always inform, all of your healthcare providers, of all  the medications you take. 7. Pain Medication from other Providers: You are not allowed to accept any additional pain  medication from any other Doctor or Healthcare provider. There are two exceptions to this rule. (see below) In the event that you require additional pain medication, you are responsible for notifying us, as stated below. 8. Medication Agreement: You are responsible for carefully reading and following our Medication Agreement. This must be signed before receiving any prescriptions from our practice. Safely store a copy of your signed Agreement. Violations to the Agreement will result in no further prescriptions. (Additional copies of our Medication Agreement are available upon request.) 9. Laws, Rules, & Regulations: All patients are expected to follow all Federal and [employer], Rules, [employer] [facility]. Ignorance of the Laws does not constitute a valid excuse. The use of any illegal substances is prohibited. 10. Adopted CDC guidelines & recommendations: Target dosing levels will be at or below 60 MME/day. Use of benzodiazepines** is not recommended.  Exceptions: There are only two exceptions to the rule of not receiving pain medications from other Healthcare Providers. 1. Exception #1 (Emergencies): In the event of an emergency (i.e.: accident requiring emergency care), you are allowed to receive additional pain medication. However, you are responsible for: As soon as you are able, call our office [phone], at any time of the day or night, and leave a message stating your name, the date and nature of the emergency, and the name and dose of the medication prescribed. In the event that your call is answered by a member of our staff, make sure to document and save the date, time, and the name of the person that took your information.  2. Exception #2 (Planned Surgery): In the event that you are scheduled by another doctor or dentist to have any type of surgery or procedure, you are allowed (for a period no longer than 30 days), to receive additional pain medication, for the acute post-op pain.  However, in this case, you are responsible for picking up a copy of our "Post-op Pain Management for Surgeons" handout, and giving it to your surgeon or dentist. This document is available at our office, and does not require an appointment to obtain it. Simply go to our office during business hours (Monday-Thursday from 8:00 AM to 4:00 PM) (Friday 8:00 AM to 12:00 Noon) or if you have a scheduled appointment with [place], prior to your surgery, and ask for it by name. In addition, you will need to provide [place] with your name, name of your surgeon, type of surgery, and date of procedure or surgery.  *Opioid medications include: morphine, codeine, oxycodone, oxymorphone, hydrocodone, hydromorphone, meperidine, tramadol, tapentadol, buprenorphine, fentanyl, methadone. **Benzodiazepine medications include: diazepam (Valium), alprazolam (Xanax), clonazepam (Klonopine), lorazepam (Ativan), clorazepate (Tranxene), chlordiazepoxide (Librium), estazolam (Prosom), oxazepam (Serax), temazepam (Restoril), triazolam (Halcion) (Last updated: 03/20/2017) ____________________________________________________________________________________________

## 2018-02-16 NOTE — Progress Notes (Signed)
Nursing Pain Medication Assessment:  Safety precautions to be maintained throughout the outpatient stay will include: orient to surroundings, keep bed in low position, maintain call bell within reach at all times, provide assistance with transfer out of bed and ambulation.  Medication Inspection Compliance: Ms. Ruppe did not comply with our request to bring her pills to be counted. She was reminded that bringing the medication bottles, even when empty, is a requirement.  Medication: None brought in. Pill/Patch Count: None available to be counted. Bottle Appearance: No container available. Did not bring bottle(s) to appointment. Filled Date: N/A Last Medication intake:  02/07/18

## 2018-03-17 ENCOUNTER — Encounter: Payer: Medicaid Other | Admitting: Nurse Practitioner

## 2018-03-25 ENCOUNTER — Ambulatory Visit: Payer: Medicaid Other | Attending: Nurse Practitioner | Admitting: Nurse Practitioner

## 2018-03-25 ENCOUNTER — Encounter: Payer: Self-pay | Admitting: Nurse Practitioner

## 2018-03-25 ENCOUNTER — Other Ambulatory Visit: Payer: Self-pay

## 2018-03-25 VITALS — BP 133/79 | HR 81 | Temp 98.1°F | Resp 18 | Ht 63.0 in | Wt 147.5 lb

## 2018-03-25 DIAGNOSIS — M25512 Pain in left shoulder: Secondary | ICD-10-CM | POA: Diagnosis present

## 2018-03-25 DIAGNOSIS — M25522 Pain in left elbow: Secondary | ICD-10-CM | POA: Insufficient documentation

## 2018-03-25 DIAGNOSIS — M25511 Pain in right shoulder: Secondary | ICD-10-CM | POA: Diagnosis present

## 2018-03-25 DIAGNOSIS — G8929 Other chronic pain: Secondary | ICD-10-CM | POA: Diagnosis present

## 2018-03-25 DIAGNOSIS — M5412 Radiculopathy, cervical region: Secondary | ICD-10-CM

## 2018-03-25 DIAGNOSIS — M797 Fibromyalgia: Secondary | ICD-10-CM | POA: Diagnosis present

## 2018-03-25 DIAGNOSIS — G894 Chronic pain syndrome: Secondary | ICD-10-CM | POA: Diagnosis present

## 2018-03-25 DIAGNOSIS — M47812 Spondylosis without myelopathy or radiculopathy, cervical region: Secondary | ICD-10-CM | POA: Diagnosis present

## 2018-03-25 DIAGNOSIS — Z79891 Long term (current) use of opiate analgesic: Secondary | ICD-10-CM | POA: Diagnosis present

## 2018-03-25 DIAGNOSIS — M7918 Myalgia, other site: Secondary | ICD-10-CM | POA: Diagnosis not present

## 2018-03-25 DIAGNOSIS — M542 Cervicalgia: Secondary | ICD-10-CM | POA: Diagnosis present

## 2018-03-25 MED ORDER — OXYCODONE HCL 5 MG PO TABS: 5.0000 mg | ORAL_TABLET | Freq: Two times a day (BID) | ORAL | 0 refills | Status: DC

## 2018-03-25 MED ORDER — CYCLOBENZAPRINE HCL 10 MG PO TABS: 10.0000 mg | ORAL_TABLET | Freq: Three times a day (TID) | ORAL | 0 refills | Status: DC | PRN

## 2018-03-25 MED ORDER — GABAPENTIN 300 MG PO CAPS: 300.0000 mg | ORAL_CAPSULE | Freq: Three times a day (TID) | ORAL | 2 refills | Status: DC

## 2018-03-25 NOTE — Progress Notes (Signed)
Nursing Pain Medication Assessment:  Safety precautions to be maintained throughout the outpatient stay will include: orient to surroundings, keep bed in low position, maintain call bell within reach at all times, provide assistance with transfer out of bed and ambulation.  Medication Inspection Compliance: Pill count conducted under aseptic conditions, in front of the patient. Neither the pills nor the bottle was removed from the patient's sight at any time. Once count was completed pills were immediately returned to the patient in their original bottle.  Medication: oxycodone 5 mg Pill/Patch Count: 0 of 60 pills remain Pill/Patch Appearance: Markings consistent with prescribed medication Bottle Appearance: Standard pharmacy container. Clearly labeled. Filled Date: 1 / 25 / 2020 Last Medication intake:  Today   Pt states that since she knew she could not keep earlier appt, she has been cutting pills in half for the past 6 days to make them last. Last 1/2 tablet taken today.

## 2018-03-25 NOTE — Progress Notes (Signed)
Patient's Name: Tanya [name]  MRN: [id number]  Referring Provider: Ricardo Jericho*  DOB: 01-06-1961  PCP: Ricardo Jericho, NP  DOS: 03/25/2018  Note by: Dionisio David, NP  Service setting: Ambulatory outpatient  Specialty: Interventional Pain Management  Location: [facility] (AMB) Pain Management Facility    Patient type: Established   HPI  Reason for Visit: Tanya [name] is a 58 y.o. year old, female patient, who comes today with a chief complaint of Neck Pain; Back Pain (upper, lower); and Joint Pain (right shoulder, left elbow) Last Appointment: Her last appointment at our practice was on 02/16/2018. I last saw her on 02/16/2018.  Pain Assessment: Today, [name] describes the severity of the Chronic pain as a 4 /10. She indicates the location/referral of the pain to be Neck Posterior/to mid back. Onset was: More than a month ago. The quality of pain is described as Burning. Temporal description, or timing of pain is: Constant. Possible modifying factors: meds. [name]  height is 5' 3"  (1.6 m) and weight is 147 lb 8 oz (66.9 kg). Her oral temperature is 98.1 F (36.7 C). Her blood pressure is 133/79 and her pulse is 81. Her respiration is 18 and oxygen saturation is 96%. She feels like right shoulder and left elbow are grinding.  She denies any recent injuries.  She has not had any recent images. She states she has a history of rheumatoid arthritis and lupus.  She has not been seen by her rheumatologist in several months.  She does take Plaquenil.  Controlled Substance Pharmacotherapy Assessment REMS (Risk Evaluation and Mitigation Strategy)  Analgesic: Oxycodone 5 mg 1 tablet twice daily MME/day: 15 mg/day. [name], RN  03/25/2018 10:58 AM  Signed Nursing Pain Medication Assessment:  Safety precautions to be maintained throughout the outpatient stay will include: orient to surroundings, keep bed in low position, maintain call bell within reach at all  times, provide assistance with transfer out of bed and ambulation.  Medication Inspection Compliance: Pill count conducted under aseptic conditions, in front of the patient. Neither the pills nor the bottle was removed from the patient's sight at any time. Once count was completed pills were immediately returned to the patient in their original bottle.  Medication: oxycodone 5 mg Pill/Patch Count: 0 of 60 pills remain Pill/Patch Appearance: Markings consistent with prescribed medication Bottle Appearance: Standard pharmacy container. Clearly labeled. Filled Date: 1 / 55 / 2020 Last Medication intake:  Today   Pt states that since she knew she could not keep earlier appt, she has been cutting pills in half for the past 6 days to make them last. Last 1/2 tablet taken today.   Pharmacokinetics: Liberation and absorption (onset of action): WNL Distribution (time to peak effect): WNL Metabolism and excretion (duration of action): WNL         Pharmacodynamics: Desired effects: Analgesia: Ms. Litzenberger reports >50% benefit. Functional ability: Patient reports that medication allows her to accomplish basic ADLs Clinically meaningful improvement in function (CMIF): Sustained CMIF goals met Perceived effectiveness: Described as relatively effective, allowing for increase in activities of daily living (ADL) Undesirable effects: Side-effects or Adverse reactions: None reported Monitoring: [place] PMP: Online review of the past 58-monthperiod conducted. Compliant with practice rules and regulations Last UDS on record: Summary  Date Value Ref Range Status  11/06/2017 FINAL  Final    Comment:    ==================================================================== TOXASSURE SELECT 13 (MW) ==================================================================== Test  Result       Flag       Units Drug Present and Declared for Prescription Verification   Oxycodone                       340          EXPECTED   ng/mg creat   Oxymorphone                    537          EXPECTED   ng/mg creat   Noroxycodone                   921          EXPECTED   ng/mg creat   Noroxymorphone                 298          EXPECTED   ng/mg creat    Sources of oxycodone are scheduled prescription medications.    Oxymorphone, noroxycodone, and noroxymorphone are expected    metabolites of oxycodone. Oxymorphone is also available as a    scheduled prescription medication. ==================================================================== Test                      Result    Flag   Units      Ref Range   Creatinine              57               mg/dL      >=20 ==================================================================== Declared Medications:  The flagging and interpretation on this report are based on the  following declared medications.  Unexpected results may arise from  inaccuracies in the declared medications.  **Note: The testing scope of this panel includes these medications:  Oxycodone (Roxicodone)  **Note: The testing scope of this panel does not include following  reported medications:  Albuterol  Cyclobenzaprine (Flexeril)  Duloxetine (Cymbalta)  Gabapentin (Neurontin)  Hydroxychloroquine (Plaquenil)  Nicotine (Nicoderm)  Pantoprazole (Protonix) ==================================================================== For clinical consultation, please call [phone]. ====================================================================    UDS interpretation: Compliant          Medication Assessment Form: Reviewed. Patient indicates being compliant with therapy Treatment compliance: Compliant Risk Assessment Profile: Aberrant behavior: See initial evaluations. None observed or detected today Comorbid factors increasing risk of overdose: See initial evaluation. No additional risks detected today Opioid risk tool (ORT):  Opioid Risk  03/25/2018  Alcohol 0  Illegal  Drugs 0  Rx Drugs 0  Alcohol 0  Illegal Drugs 0  Rx Drugs 0  Age between 16-45 years  0  History of Preadolescent Sexual Abuse 0  Psychological Disease 0  ADD -  OCD -  Bipolar -  Depression 1  Opioid Risk Tool Scoring 1  Opioid Risk Interpretation Low Risk    ORT Scoring interpretation table:  Score <3 = Low Risk for SUD  Score between 4-7 = Moderate Risk for SUD  Score >8 = High Risk for Opioid Abuse   Risk of substance use disorder (SUD): Low  Risk Mitigation Strategies:  Patient Counseling: Covered Patient-Prescriber Agreement (PPA): Present and active  Notification to other healthcare providers: Done  Pharmacologic Plan: No change in therapy, at this time.            ROS  Constitutional: Denies any fever or chills Gastrointestinal: No reported hemesis, hematochezia, vomiting, or acute  GI distress Musculoskeletal: Denies any acute onset joint swelling, redness, loss of ROM, or weakness Neurological: No reported episodes of acute onset apraxia, aphasia, dysarthria, agnosia, amnesia, paralysis, loss of coordination, or loss of consciousness  Medication Review  DULoxetine, cyclobenzaprine, gabapentin, hydroxychloroquine, oxyCODONE, and pantoprazole  History Review  Allergy: Ms. Martello is allergic to trazodone; bupropion; methocarbamol; oxycodone-acetaminophen; tramadol; and trazodone and nefazodone. Drug: Ms. Cid  reports no history of drug use. Alcohol:  reports no history of alcohol use. Tobacco:  reports that she has been smoking cigarettes. She has a 17.50 pack-year smoking history. She has never used smokeless tobacco. Social: Ms. Minshall  reports that she has been smoking cigarettes. She has a 17.50 pack-year smoking history. She has never used smokeless tobacco. She reports that she does not drink alcohol or use drugs. Medical:  has a past medical history of Anxiety, Bladder infection, Chronic lower back pain, Chronic neck pain, Depression,  Diverticulitis, Hyperlipidemia, Lupus ([place]), and Overactive bladder. Surgical: Ms. Hesler  has a past surgical history that includes Abdominal hysterectomy; Colonoscopy; Colonoscopy with propofol (N/A, 07/03/2017); Tonsillectomy; Esophagogastroduodenoscopy (N/A, 10/21/2017); and Colonoscopy with propofol (N/A, 10/21/2017). Family: family history includes Cancer in her mother; Diabetes in her brother; Emphysema in her mother; Glaucoma in her father; Heart disease in her father; Hypertension in her mother; Stroke in her mother. Problem List: Ms. Landa does not have any pertinent problems on file.  Lab Review  Kidney Function Lab Results  Component Value Date   BUN 10 09/03/2017   CREATININE 0.67 09/03/2017   BCR 15 09/03/2017   GFRAA 114 09/03/2017   GFRNONAA 99 09/03/2017  Liver Function Lab Results  Component Value Date   AST 15 09/03/2017   ALT 16 09/08/2016   ALBUMIN 4.4 09/03/2017  Note: Above Lab results reviewed.  Imaging Review  DG C-Arm 1-60 Min-No Report Fluoroscopy was utilized by the requesting physician.  No radiographic  interpretation.  Note: Reviewed        Physical Exam  General appearance: Well nourished, well developed, and well hydrated. In no apparent acute distress Mental status: Alert, oriented x 3 (person, place, & time)       Respiratory: No evidence of acute respiratory distress Eyes: PERLA Vitals: BP 133/79   Pulse 81   Temp 98.1 F (36.7 C) (Oral)   Resp 18   Ht 5' 3"  (1.6 m)   Wt 147 lb 8 oz (66.9 kg)   SpO2 96%   BMI 26.13 kg/m  BMI: Estimated body mass index is 26.13 kg/m as calculated from the following:   Height as of this encounter: 5' 3"  (1.6 m).   Weight as of this encounter: 147 lb 8 oz (66.9 kg). Ideal: Ideal body weight: 52.4 kg (115 lb 8.3 oz) Adjusted ideal body weight: 58.2 kg (128 lb 5 oz) Cervical Spine Area Exam  Skin & Axial Inspection: No masses, redness, edema, swelling, or associated skin lesions Alignment:  Symmetrical Functional ROM: Unrestricted ROM      Stability: No instability detected Muscle Tone/Strength: Functionally intact. No obvious neuro-muscular anomalies detected. Sensory (Neurological): Unimpaired Palpation: No palpable anomalies             Upper Extremity (UE) Exam    Side: Right upper extremity  Side: Left upper extremity  Skin & Extremity Inspection: Skin color, temperature, and hair growth are WNL. No peripheral edema or cyanosis. No masses, redness, swelling, asymmetry, or associated skin lesions. No contractures.  Skin & Extremity Inspection: Skin color, temperature, and hair  growth are WNL. No peripheral edema or cyanosis. No masses, redness, swelling, asymmetry, or associated skin lesions. No contractures.  Functional ROM: Decreased ROM for shoulder  Functional ROM: Decreased ROM for elbow  Muscle Tone/Strength: Functionally intact. No obvious neuro-muscular anomalies detected.  Muscle Tone/Strength: Functionally intact. No obvious neuro-muscular anomalies detected.  Sensory (Neurological): Unimpaired          Sensory (Neurological): Unimpaired          Palpation: Tender              Palpation: Tender              Provocative Test(s):  Phalen's test: deferred Tinel's test: deferred Apley's scratch test (touch opposite shoulder):  Action 1 (Across chest): Decreased ROM Action 2 (Overhead): Decreased ROM Action 3 (LB reach): Decreased ROM   Provocative Test(s):  Phalen's test: deferred Tinel's test: deferred Apley's scratch test (touch opposite shoulder):  Action 1 (Across chest): deferred Action 2 (Overhead): deferred Action 3 (LB reach): deferred    Assessment   Status Diagnosis  Controlled Controlled Controlled 1. Cervical spondylosis   2. Cervical facet syndrome (Bilateral) (R>L)   3. Cervicalgia   4. Musculoskeletal pain   5. Chronic pain syndrome   6. Fibromyalgia   7. Shoulder radicular pain (Bilateral) (R>L)   8. Elbow pain, chronic, left   9.  Chronic shoulder pain (Bilateral) (R>L)   10. Long term prescription opiate use      Updated Problems: Problem  Elbow Pain, Chronic, Left    Plan of Care  Pharmacotherapy (Medications Ordered): Meds ordered this encounter  Medications  . cyclobenzaprine (FLEXERIL) 10 MG tablet    Sig: Take 1 tablet (10 mg total) by mouth 3 (three) times daily as needed for muscle spasms.    Dispense:  90 tablet    Refill:  0    Do not add this medication to the electronic "Automatic Refill" notification system. Patient may have prescription filled one day early if pharmacy is closed on scheduled refill date.    Order Specific Question:   Supervising Provider    Answer:   Milinda Pointer [phone]  . oxyCODONE (OXY IR/ROXICODONE) 5 MG immediate release tablet    Sig: Take 1 tablet (5 mg total) by mouth 2 (two) times daily for 30 days.    Dispense:  60 tablet    Refill:  0    Do not place this medication, or any other prescription from our practice, on "Automatic Refill". Patient may have prescription filled one day early if pharmacy is closed on scheduled refill date.    Order Specific Question:   Supervising Provider    Answer:   Milinda Pointer [phone]  . oxyCODONE (OXY IR/ROXICODONE) 5 MG immediate release tablet    Sig: Take 1 tablet (5 mg total) by mouth 2 (two) times daily for 30 days.    Dispense:  60 tablet    Refill:  0    Do not place this medication, or any other prescription from our practice, on "Automatic Refill". Patient may have prescription filled one day early if pharmacy is closed on scheduled refill date.    Order Specific Question:   Supervising Provider    Answer:   Milinda Pointer [phone]  . oxyCODONE (OXY IR/ROXICODONE) 5 MG immediate release tablet    Sig: Take 1 tablet (5 mg total) by mouth 2 (two) times daily for 30 days.    Dispense:  60 tablet    Refill:  0  Do not place this medication, or any other prescription from our practice, on "Automatic Refill".  Patient may have prescription filled one day early if pharmacy is closed on scheduled refill date.    Order Specific Question:   Supervising Provider    Answer:   Milinda Pointer [phone]  . gabapentin (NEURONTIN) 300 MG capsule    Sig: Take 1 capsule (300 mg total) by mouth 3 (three) times daily.    Dispense:  90 capsule    Refill:  2    Do not add this medication to the electronic "Automatic Refill" notification system. Patient may have prescription filled one day early if pharmacy is closed on scheduled refill date.    Order Specific Question:   Supervising Provider    Answer:   Milinda Pointer [phone]   Administered today: Deberah Castle. Hamblin had no medications administered during this visit.  Orders:  Orders Placed This Encounter  Procedures  . SHOULDER INJECTION    Standing Status:   Future    Standing Expiration Date:   04/24/2018    Scheduling Instructions:     Side: Right-sided     Sedation: No Sedation.     Timeframe: ASAP    Order Specific Question:   Where will this procedure be performed?    Answer:   [facility]    Comments:   by Dr. Dossie Arbour  . Medium Joint Injection/Arthrocentesis    Level(s): Elbow Laterality: Left Sedation: No Sedation Purpose: Diagnostic Indication(s): Sub-acute pain    Standing Status:   Future    Standing Expiration Date:   09/25/2019    Scheduling Instructions:     Requested Scheduling Timeframe: As soon as pre-approved  . DG Shoulder Right    Standing Status:   Future    Standing Expiration Date:   05/25/2019    Order Specific Question:   Reason for Exam (SYMPTOM  OR DIAGNOSIS REQUIRED)    Answer:   Right shoulder pain    Order Specific Question:   Is the patient pregnant?    Answer:   No    Order Specific Question:   Preferred imaging location?    Answer:   [facility]    Order Specific Question:   Call Results- Best Contact Number?    Answer:   [phone]  . DG ELBOW COMPLETE LEFT (3+VIEW)    Standing Status:    Future    Standing Expiration Date:   05/25/2019    Order Specific Question:   Reason for Exam (SYMPTOM  OR DIAGNOSIS REQUIRED)    Answer:   chronic pain    Order Specific Question:   Is patient pregnant?    Answer:   No    Order Specific Question:   Preferred imaging location?    Answer:   [facility]    Order Specific Question:   Radiology Contrast Protocol - do NOT remove file path    Answer:   [email or web address]  . ToxASSURE Select 13 (MW), Urine    Volume: 30 ml(s). Minimum 3 ml of urine is needed. Document temperature of fresh sample. Indications: Long term (current) use of opiate analgesic (X90.240)    Follow-up plan:   Return in about 3 months (around 06/25/2018) for MedMgmt, and, Procedure(NS), w/ Dr. Dossie Arbour, (R) shoulder inj and (L) elbow inj.   Interventional options: Considering: Diagnostic bilateral intra-articular shoulder joint injection Diagnostic bilateral suprascapular nerve block Possible bilateral suprascapular nerve RFA Right-sided cervical epidural steroid injection(Series #2) Diagnostic bilateral cervical facet block Possible  bilateral cervical facetRFA Diagnostic bilateral lumbar facet block Possible bilateral lumbar facetRFA   Palliative PRN treatment(s): Palliative right-sided cervicalESI(Series #2) Palliative right-sided cervical facet RFA#3(Last done on: 10/23/17) Palliative left-sided cervical facet RFA #2(Last done on:06/26/2016)    Note by: Dionisio David, NP Date: 03/25/2018; Time: 4:08 PM

## 2018-03-25 NOTE — Patient Instructions (Addendum)
____________________________________________________________________________________________  Medication Rules  Purpose: To inform patients, and their family members, of our rules and regulations.  Applies to: All patients receiving prescriptions (written or electronic).  Pharmacy of record: Pharmacy where electronic prescriptions will be sent. If written prescriptions are taken to a different pharmacy, please inform the nursing staff. The pharmacy listed in the electronic medical record should be the one where you would like electronic prescriptions to be sent.  Electronic prescriptions: In compliance with the [place] Strengthen Opioid Misuse Prevention (STOP) Act of 2017 (Session Law 2017-74/H243), effective January 21, 2018, all controlled substances must be electronically prescribed. Calling prescriptions to the pharmacy will cease to exist.  Prescription refills: Only during scheduled appointments. Applies to all prescriptions.  NOTE: The following applies primarily to controlled substances (Opioid* Pain Medications).   Patient's responsibilities: 1. Pain Pills: Bring all pain pills to every appointment (except for procedure appointments). 2. Pill Bottles: Bring pills in original pharmacy bottle. Always bring the newest bottle. Bring bottle, even if empty. 3. Medication refills: You are responsible for knowing and keeping track of what medications you take and those you need refilled. The day before your appointment: write a list of all prescriptions that need to be refilled. The day of the appointment: give the list to the admitting nurse. Prescriptions will be written only during appointments. No prescriptions will be written on procedure days. If you forget a medication: it will not be "Called in", "Faxed", or "electronically sent". You will need to get another appointment to get these prescribed. No early refills. Do not call asking to have your prescription filled  early. 4. Prescription Accuracy: You are responsible for carefully inspecting your prescriptions before leaving our office. Have the discharge nurse carefully go over each prescription with you, before taking them home. Make sure that your name is accurately spelled, that your address is correct. Check the name and dose of your medication to make sure it is accurate. Check the number of pills, and the written instructions to make sure they are clear and accurate. Make sure that you are given enough medication to last until your next medication refill appointment. 5. Taking Medication: Take medication as prescribed. When it comes to controlled substances, taking less pills or less frequently than prescribed is permitted and encouraged. Never take more pills than instructed. Never take medication more frequently than prescribed.  6. Inform other Doctors: Always inform, all of your healthcare providers, of all the medications you take. 7. Pain Medication from other Providers: You are not allowed to accept any additional pain medication from any other Doctor or Healthcare provider. There are two exceptions to this rule. (see below) In the event that you require additional pain medication, you are responsible for notifying us, as stated below. 8. Medication Agreement: You are responsible for carefully reading and following our Medication Agreement. This must be signed before receiving any prescriptions from our practice. Safely store a copy of your signed Agreement. Violations to the Agreement will result in no further prescriptions. (Additional copies of our Medication Agreement are available upon request.) 9. Laws, Rules, & Regulations: All patients are expected to follow all Federal and State Laws, Statutes, Rules, & Regulations. Ignorance of the Laws does not constitute a valid excuse. The use of any illegal substances is prohibited. 10. Adopted CDC guidelines & recommendations: Target dosing levels will be  at or below 60 MME/day. Use of benzodiazepines** is not recommended.  Exceptions: There are only two exceptions to the rule of not   receiving pain medications from other Healthcare Providers. 1. Exception #1 (Emergencies): In the event of an emergency (i.e.: accident requiring emergency care), you are allowed to receive additional pain medication. However, you are responsible for: As soon as you are able, call our office [phone], at any time of the day or night, and leave a message stating your name, the date and nature of the emergency, and the name and dose of the medication prescribed. In the event that your call is answered by a member of our staff, make sure to document and save the date, time, and the name of the person that took your information.  2. Exception #2 (Planned Surgery): In the event that you are scheduled by another doctor or dentist to have any type of surgery or procedure, you are allowed (for a period no longer than 30 days), to receive additional pain medication, for the acute post-op pain. However, in this case, you are responsible for picking up a copy of our "Post-op Pain Management for Surgeons" handout, and giving it to your surgeon or dentist. This document is available at our office, and does not require an appointment to obtain it. Simply go to our office during business hours (Monday-Thursday from 8:00 AM to 4:00 PM) (Friday 8:00 AM to 12:00 Noon) or if you have a scheduled appointment with us, prior to your surgery, and ask for it by name. In addition, you will need to provide us with your name, name of your surgeon, type of surgery, and date of procedure or surgery.  *Opioid medications include: morphine, codeine, oxycodone, oxymorphone, hydrocodone, hydromorphone, meperidine, tramadol, tapentadol, buprenorphine, fentanyl, methadone. **Benzodiazepine medications include: diazepam (Valium), alprazolam (Xanax), clonazepam (Klonopine), lorazepam (Ativan), clorazepate  (Tranxene), chlordiazepoxide (Librium), estazolam (Prosom), oxazepam (Serax), temazepam (Restoril), triazolam (Halcion) (Last updated: 03/20/2017) ____________________________________________________________________________________________   ____________________________________________________________________________________________  Appointment Policy Summary  It is our goal and responsibility to provide the medical community with assistance in the evaluation and management of patients with chronic pain. Unfortunately our resources are limited. Because we do not have an unlimited amount of time, or available appointments, we are required to closely monitor and manage their use. The following rules exist to maximize their use:  Patient's responsibilities: 1. Punctuality:  At what time should I arrive? You should be physically present in our office 30 minutes before your scheduled appointment. Your scheduled appointment is with your assigned healthcare provider. However, it takes 5-10 minutes to be "checked-in", and another 15 minutes for the nurses to do the admission. If you arrive to our office at the time you were given for your appointment, you will end up being at least 20-25 minutes late to your appointment with the provider. 2. Tardiness:  What happens if I arrive only a few minutes after my scheduled appointment time? You will need to reschedule your appointment. The cutoff is your appointment time. This is why it is so important that you arrive at least 30 minutes before that appointment. If you have an appointment scheduled for 10:00 AM and you arrive at 10:01, you will be required to reschedule your appointment.  3. Plan ahead:  Always assume that you will encounter traffic on your way in. Plan for it. If you are dependent on a driver, make sure they understand these rules and the need to arrive early. 4. Other appointments and responsibilities:  Avoid scheduling any other appointments  before or after your pain clinic appointments.  5. Be prepared:  Write down everything that you need to discuss   with your healthcare provider and give this information to the admitting nurse. Write down the medications that you will need refilled. Bring your pills and bottles (even the empty ones), to all of your appointments, except for those where a procedure is scheduled. 6. No children or pets:  Find someone to take care of them. It is not appropriate to bring them in. 7. Scheduling changes:  We request "advanced notification" of any changes or cancellations. 8. Advanced notification:  Defined as a time period of more than 24 hours prior to the originally scheduled appointment. This allows for the appointment to be offered to other patients. 9. Rescheduling:  When a visit is rescheduled, it will require the cancellation of the original appointment. For this reason they both fall within the category of "Cancellations".  10. Cancellations:  They require advanced notification. Any cancellation less than 24 hours before the  appointment will be recorded as a "No Show". 11. No Show:  Defined as an unkept appointment where the patient failed to notify or declare to the practice their intention or inability to keep the appointment.  Corrective process for repeat offenders:  1. Tardiness: Three (3) episodes of rescheduling due to late arrivals will be recorded as one (1) "No Show". 2. Cancellation or reschedule: Three (3) cancellations or rescheduling will be recorded as one (1) "No Show". 3. "No Shows": Three (3) "No Shows" within a 12 month period will result in discharge from the practice. ____________________________________________________________________________________________ _____________________________________________________________________________  Preparing for Procedure with Sedation  Instructions: . Oral Intake: Do not eat or drink anything for at least 8 hours prior to your  procedure. . Transportation: Public transportation is not allowed. Bring an adult driver. The driver must be physically present in our waiting room before any procedure can be started. Marland Kitchen Physical Assistance: Bring an adult physically capable of assisting you, in the event you need help. This adult should keep you company at home for at least 6 hours after the procedure. . Blood Pressure Medicine: Take your blood pressure medicine with a sip of water the morning of the procedure. . Blood thinners: Notify our staff if you are taking any blood thinners. Depending on which one you take, there will be specific instructions on how and when to stop it. . Diabetics on insulin: Notify the staff so that you can be scheduled 1st case in the morning. If your diabetes requires high dose insulin, take only  of your normal insulin dose the morning of the procedure and notify the staff that you have done so. . Preventing infections: Shower with an antibacterial soap the morning of your procedure. . Build-up your immune system: Take 1000 mg of Vitamin C with every meal (3 times a day) the day prior to your procedure. Marland Kitchen Antibiotics: Inform the staff if you have a condition or reason that requires you to take antibiotics before dental procedures. . Pregnancy: If you are pregnant, call and cancel the procedure. . Sickness: If you have a cold, fever, or any active infections, call and cancel the procedure. . Arrival: You must be in the facility at least 30 minutes prior to your scheduled procedure. . Children: Do not bring children with you. . Dress appropriately: Bring dark clothing that you would not mind if they get stained. . Valuables: Do not bring any jewelry or valuables.  Procedure appointments are reserved for interventional treatments only. Marland Kitchen No Prescription Refills. . No medication changes will be discussed during procedure appointments. . No disability issues will  be discussed.  Reasons to call and  reschedule or cancel your procedure: (Following these recommendations will minimize the risk of a serious complication.) . Surgeries: Avoid having procedures within 2 weeks of any surgery. (Avoid for 2 weeks before or after any surgery). . Flu Shots: Avoid having procedures within 2 weeks of a flu shots or . (Avoid for 2 weeks before or after immunizations). . Barium: Avoid having a procedure within 7-10 days after having had a radiological study involving the use of radiological contrast. (Myelograms, Barium swallow or enema study). . Heart attacks: Avoid any elective procedures or surgeries for the initial 6 months after a "Myocardial Infarction" (Heart Attack). . Blood thinners: It is imperative that you stop these medications before procedures. Let us know if you if you take any blood thinner.  . Infection: Avoid procedures during or within two weeks of an infection (including chest colds or gastrointestinal problems). Symptoms associated with infections include: Localized redness, fever, chills, night sweats or profuse sweating, burning sensation when voiding, cough, congestion, stuffiness, runny nose, sore throat, diarrhea, nausea, vomiting, cold or Flu symptoms, recent or current infections. It is specially important if the infection is over the area that we intend to treat. Marland Kitchen Heart and lung problems: Symptoms that may suggest an active cardiopulmonary problem include: cough, chest pain, breathing difficulties or shortness of breath, dizziness, ankle swelling, uncontrolled high or unusually low blood pressure, and/or palpitations. If you are experiencing any of these symptoms, cancel your procedure and contact your primary care physician for an evaluation.  Remember:  Regular Business hours are:  Monday to Thursday 8:00 AM to 4:00 PM  Provider's Schedule: Milinda Pointer, MD:  Procedure days: Tuesday and Thursday 7:30 AM to 4:00 PM  Gillis Santa, MD:  Procedure days: Monday and Wednesday  7:30 AM to 4:00 PM ____________________________________________________________________________________________   Trigger Point Injection Trigger points are areas where you have pain. A trigger point injection is a shot given in the trigger point to help relieve pain for a few days to a few months. Common places for trigger points include:  The neck.  The shoulders.  The upper back.  The lower back. A trigger point injection will not cure long-lasting (chronic) pain permanently. These injections do not always work for every person, but for some people they can help to relieve pain for a few days to a few months. Tell a health care provider about:  Any allergies you have.  All medicines you are taking, including vitamins, herbs, eye drops, creams, and over-the-counter medicines.  Any problems you or family members have had with anesthetic medicines.  Any blood disorders you have.  Any surgeries you have had.  Any medical conditions you have. What are the risks? Generally, this is a safe procedure. However, problems may occur, including:  Infection.  Bleeding.  Allergic reaction to the injected medicine.  Irritation of the skin around the injection site. What happens before the procedure?  Ask your health care provider about changing or stopping your regular medicines. This is especially important if you are taking diabetes medicines or blood thinners. What happens during the procedure?  Your health care provider will feel for trigger points. A marker may be used to circle the area for the injection.  The skin over the trigger point will be washed with a germ-killing (antiseptic) solution.  A thin needle is used for the shot. You may feel pain or a twitching feeling when the needle enters the trigger point.  A numbing  solution may be injected into the trigger point. Sometimes a medicine to keep down swelling, redness, and warmth (inflammation) is also injected.  Your  health care provider may move the needle around the area where the trigger point is located until the tightness and twitching goes away.  After the injection, your health care provider may put gentle pressure over the injection site.  The injection site will be covered with a bandage (dressing). The procedure may vary among health care providers and hospitals. What happens after the procedure?  The dressing can be taken off in a few hours or as told by your health care provider.  You may feel sore and stiff for 1-2 days. This information is not intended to replace advice given to you by your health care provider. Make sure you discuss any questions you have with your health care provider. Document Released: 12/27/2010 Document Revised: 09/10/2015 Document Reviewed: 06/27/2014 Elsevier Interactive Patient Education  2019 [employer].

## 2018-03-30 ENCOUNTER — Ambulatory Visit
Admission: RE | Admit: 2018-03-30 | Discharge: 2018-03-30 | Disposition: A | Discharge status: Home / Self Care | Payer: Medicaid Other | Source: Ambulatory Visit | Attending: Nurse Practitioner | Admitting: Nurse Practitioner

## 2018-03-30 DIAGNOSIS — M25512 Pain in left shoulder: Secondary | ICD-10-CM | POA: Insufficient documentation

## 2018-03-30 DIAGNOSIS — M25522 Pain in left elbow: Secondary | ICD-10-CM | POA: Diagnosis present

## 2018-03-30 DIAGNOSIS — M25511 Pain in right shoulder: Secondary | ICD-10-CM | POA: Diagnosis not present

## 2018-03-30 DIAGNOSIS — G8929 Other chronic pain: Secondary | ICD-10-CM | POA: Diagnosis present

## 2018-03-31 LAB — TOXASSURE SELECT 13 (MW), URINE

## 2018-03-31 NOTE — Progress Notes (Signed)
Results were reviewed and found to be: mildly abnormal  No acute injury or pathology identified  Review would suggest interventional pain management techniques may be of benefit 

## 2018-04-02 ENCOUNTER — Other Ambulatory Visit: Payer: Self-pay

## 2018-04-02 ENCOUNTER — Other Ambulatory Visit: Payer: Self-pay | Admitting: Pain Medicine

## 2018-04-02 ENCOUNTER — Encounter: Payer: Self-pay | Admitting: Pain Medicine

## 2018-04-02 ENCOUNTER — Ambulatory Visit: Admission: RE | Admit: 2018-04-02 | Payer: Medicaid Other | Source: Ambulatory Visit

## 2018-04-02 ENCOUNTER — Ambulatory Visit
Admission: RE | Admit: 2018-04-02 | Discharge: 2018-04-02 | Disposition: A | Discharge status: Home / Self Care | Payer: Medicaid Other | Source: Ambulatory Visit | Attending: Pain Medicine | Admitting: Pain Medicine

## 2018-04-02 ENCOUNTER — Ambulatory Visit: Payer: Medicaid Other | Admitting: Pain Medicine

## 2018-04-02 VITALS — BP 129/78 | HR 80 | Temp 97.8°F | Resp 14 | Ht 63.0 in | Wt 147.0 lb

## 2018-04-02 DIAGNOSIS — M7702 Medial epicondylitis, left elbow: Secondary | ICD-10-CM

## 2018-04-02 DIAGNOSIS — M19011 Primary osteoarthritis, right shoulder: Secondary | ICD-10-CM | POA: Insufficient documentation

## 2018-04-02 DIAGNOSIS — G8929 Other chronic pain: Secondary | ICD-10-CM | POA: Diagnosis present

## 2018-04-02 DIAGNOSIS — M25522 Pain in left elbow: Secondary | ICD-10-CM | POA: Diagnosis present

## 2018-04-02 DIAGNOSIS — M25511 Pain in right shoulder: Secondary | ICD-10-CM | POA: Insufficient documentation

## 2018-04-02 MED ORDER — METHYLPREDNISOLONE ACETATE 80 MG/ML IJ SUSP
INTRAMUSCULAR | Status: AC
  Filled 2018-04-02: qty 2

## 2018-04-02 MED ORDER — LIDOCAINE HCL 2 % IJ SOLN
INTRAMUSCULAR | Status: AC
  Filled 2018-04-02: qty 20

## 2018-04-02 MED ORDER — FENTANYL CITRATE (PF) 100 MCG/2ML IJ SOLN
25.0000 ug | INTRAMUSCULAR | Status: DC | PRN
  Administered 2018-04-02: 100 ug via INTRAVENOUS

## 2018-04-02 MED ORDER — METHYLPREDNISOLONE ACETATE 80 MG/ML IJ SUSP
80.0000 mg | Freq: Once | INTRAMUSCULAR | Status: AC
  Administered 2018-04-02: 80 mg

## 2018-04-02 MED ORDER — MIDAZOLAM HCL 5 MG/5ML IJ SOLN
1.0000 mg | INTRAMUSCULAR | Status: DC | PRN
  Administered 2018-04-02: 3 mg via INTRAVENOUS

## 2018-04-02 MED ORDER — ROPIVACAINE HCL 2 MG/ML IJ SOLN
INTRAMUSCULAR | Status: AC
  Filled 2018-04-02: qty 10

## 2018-04-02 MED ORDER — LACTATED RINGERS IV SOLN
1000.0000 mL | Freq: Once | INTRAVENOUS | Status: AC
  Administered 2018-04-02: 1000 mL via INTRAVENOUS

## 2018-04-02 MED ORDER — LIDOCAINE HCL 2 % IJ SOLN
20.0000 mL | Freq: Once | INTRAMUSCULAR | Status: AC
  Administered 2018-04-02: 400 mg

## 2018-04-02 MED ORDER — FENTANYL CITRATE (PF) 100 MCG/2ML IJ SOLN
INTRAMUSCULAR | Status: AC
  Filled 2018-04-02: qty 2

## 2018-04-02 MED ORDER — MIDAZOLAM HCL 5 MG/5ML IJ SOLN
INTRAMUSCULAR | Status: AC
  Filled 2018-04-02: qty 5

## 2018-04-02 MED ORDER — ROPIVACAINE HCL 2 MG/ML IJ SOLN
4.0000 mL | Freq: Once | INTRAMUSCULAR | Status: AC
  Administered 2018-04-02: 4 mL via PERINEURAL

## 2018-04-02 MED ORDER — ROPIVACAINE HCL 2 MG/ML IJ SOLN
4.0000 mL | Freq: Once | INTRAMUSCULAR | Status: AC
  Administered 2018-04-02: 4 mL via INTRA_ARTICULAR

## 2018-04-02 MED ORDER — METHYLPREDNISOLONE ACETATE 80 MG/ML IJ SUSP
80.0000 mg | Freq: Once | INTRAMUSCULAR | Status: AC
  Administered 2018-04-02: 80 mg via INTRA_ARTICULAR

## 2018-04-02 NOTE — Progress Notes (Signed)
Safety precautions to be maintained throughout the outpatient stay will include: orient to surroundings, keep bed in low position, maintain call bell within reach at all times, provide assistance with transfer out of bed and ambulation.  

## 2018-04-02 NOTE — Patient Instructions (Signed)

## 2018-04-02 NOTE — Progress Notes (Signed)
Patient's Name: Tanya [name]  MRN: [id number]  Referring Provider: Ricardo Jericho*  DOB: [birth year]  PCP: Ricardo Jericho, NP  DOS: 04/02/2018  Note by: Gaspar Cola, MD  Service setting: Ambulatory outpatient  Specialty: Interventional Pain Management  Patient type: Established  Location: [facility] (AMB) Pain Management Facility  Visit type: Interventional Procedure   Primary Reason for Visit: Interventional Pain Management Treatment. CC: Shoulder Pain (right) and Elbow Pain (left)  Procedure #1:  Anesthesia, Analgesia, Anxiolysis:  Type: Diagnostic Acromio-clavicular Joint Injection #1  Primary Purpose: Diagnostic Region: Anterior Shoulder Area Level:  Shoulder Target Area: Acromio-clavicular Joint Approach: Anterior approach. Laterality: Right-Sided  Type: Moderate (Conscious) Sedation combined with Local Anesthesia Indication(s): Analgesia and Anxiety Route: Intravenous (IV) IV Access: Secured Sedation: Meaningful verbal contact was maintained at all times during the procedure  Local Anesthetic: Lidocaine 1-2%  Position: Supine   Indications: 1. Osteoarthritis of AC (acromioclavicular) joint (Right)   2. Chronic acromioclavicular joint pain (Right)   3. Chronic shoulder pain (Right)    Procedure #2:  Anesthesia, Analgesia, Anxiolysis:  Type: Diagnostic Medial Epicondyle Steroid Injection  #1 Region: Medial Elbow Area Level: Elbow Laterality: Left-Sided  Type: Moderate (Conscious) Sedation combined with Local Anesthesia Indication(s): Analgesia and Anxiety Local Anesthetic: Lidocaine 1-2% Route: Intravenous (IV) IV Access: Secured Sedation: Meaningful verbal contact was maintained at all times during the procedure   Position: Prone   Pain Score: Pre-procedure: 4 /10 Post-procedure: 6 /10  Pre-op Assessment:  Tanya [name] is a 58 y.o. (year old), female patient, seen today for interventional treatment. She  has a past surgical history that  includes Abdominal hysterectomy; Colonoscopy; Colonoscopy with propofol (N/A, 07/03/2017); Tonsillectomy; Esophagogastroduodenoscopy (N/A, 10/21/2017); and Colonoscopy with propofol (N/A, 10/21/2017). Tanya [name] has a current medication list which includes the following prescription(s): cyclobenzaprine, duloxetine, gabapentin, hydroxychloroquine, oxycodone, oxycodone, oxycodone, pantoprazole, and duloxetine, and the following Facility-Administered Medications: fentanyl and midazolam. Her primarily concern today is the Shoulder Pain (right) and Elbow Pain (left)  Initial Vital Signs:  Pulse/HCG Rate: 86  Temp: 97.8 F (36.6 C) Resp: 16 BP: 111/80 SpO2: 100 %  BMI: Estimated body mass index is 26.04 kg/m as calculated from the following:   Height as of this encounter: 5\' 3"  (1.6 m).   Weight as of this encounter: 147 lb (66.7 kg).  Risk Assessment: Allergies: Reviewed. She is allergic to trazodone; bupropion; methocarbamol; oxycodone-acetaminophen; tramadol; and trazodone and nefazodone.  Allergy Precautions: None required Coagulopathies: Reviewed. None identified.  Blood-thinner therapy: None at this time Active Infection(s): Reviewed. None identified. Tanya [name] is afebrile  Site Confirmation: Tanya [name] was asked to confirm the procedure and laterality before marking the site Procedure checklist: Completed Consent: Before the procedure and under the influence of no sedative(s), amnesic(s), or anxiolytics, the patient was informed of the treatment options, risks and possible complications. To fulfill our ethical and legal obligations, as recommended by the American Medical Association's Code of Ethics, I have informed the patient of my clinical impression; the nature and purpose of the treatment or procedure; the risks, benefits, and possible complications of the intervention; the alternatives, including doing nothing; the risk(s) and benefit(s) of the alternative treatment(s) or  procedure(s); and the risk(s) and benefit(s) of doing nothing. The patient was provided information about the general risks and possible complications associated with the procedure. These may include, but are not limited to: failure to achieve desired goals, infection, bleeding, organ or nerve damage, allergic reactions, paralysis, and death. In addition, the patient was informed  of those risks and complications associated to the procedure, such as failure to decrease pain; infection; bleeding; organ or nerve damage with subsequent damage to sensory, motor, and/or autonomic systems, resulting in permanent pain, numbness, and/or weakness of one or several areas of the body; allergic reactions; (i.e.: anaphylactic reaction); and/or death. Furthermore, the patient was informed of those risks and complications associated with the medications. These include, but are not limited to: allergic reactions (i.e.: anaphylactic or anaphylactoid reaction(s)); adrenal axis suppression; blood sugar elevation that in diabetics may result in ketoacidosis or comma; water retention that in patients with history of congestive heart failure may result in shortness of breath, pulmonary edema, and decompensation with resultant heart failure; weight gain; swelling or edema; medication-induced neural toxicity; particulate matter embolism and blood vessel occlusion with resultant organ, and/or nervous system infarction; and/or aseptic necrosis of one or more joints. Finally, the patient was informed that Medicine is not an exact science; therefore, there is also the possibility of unforeseen or unpredictable risks and/or possible complications that may result in a catastrophic outcome. The patient indicated having understood very clearly. We have given the patient no guarantees and we have made no promises. Enough time was given to the patient to ask questions, all of which were answered to the patient's satisfaction. Tanya [name] has  indicated that she wanted to continue with the procedure. Attestation: I, the ordering provider, attest that I have discussed with the patient the benefits, risks, side-effects, alternatives, likelihood of achieving goals, and potential problems during recovery for the procedure that I have provided informed consent. Date  Time: 04/02/2018 11:01 AM  Pre-Procedure Preparation:  Monitoring: As per clinic protocol. Respiration, ETCO2, SpO2, BP, heart rate and rhythm monitor placed and checked for adequate function Safety Precautions: Patient was assessed for positional comfort and pressure points before starting the procedure. Time-out: I initiated and conducted the "Time-out" before starting the procedure, as per protocol. The patient was asked to participate by confirming the accuracy of the "Time Out" information. Verification of the correct person, site, and procedure were performed and confirmed by me, the nursing staff, and the patient. "Time-out" conducted as per Joint Commission's Universal Protocol (UP.01.01.01). Time: 0936  Description of Procedure #1:  Area Prepped: Entire shoulder Area Prepping solution: ChloraPrep (2% chlorhexidine gluconate and 70% isopropyl alcohol) Safety Precautions: Aspiration looking for blood return was conducted prior to all injections. At no point did we inject any substances, as a needle was being advanced. No attempts were made at seeking any paresthesias. Safe injection practices and needle disposal techniques used. Medications properly checked for expiration dates. SDV (single dose vial) medications used. Description of the Procedure: Protocol guidelines were followed. The patient was placed in position over the procedure table. The target area was identified and the area prepped in the usual manner. Skin & deeper tissues infiltrated with local anesthetic. Appropriate amount of time allowed to pass for local anesthetics to take effect. The procedure needles were  then advanced to the target area. Proper needle placement secured. Negative aspiration confirmed. Solution injected in intermittent fashion, asking for systemic symptoms every 0.5cc of injectate. The needles were then removed and the area cleansed, making sure to leave some of the prepping solution back to take advantage of its long term bactericidal properties.    Vitals:   04/02/18 0934 04/02/18 0939 04/02/18 0944 04/02/18 1102  BP: 129/76 121/89 129/78   Pulse: 76 79 80   Resp: 14 13 14    Temp:  TempSrc:      SpO2: 96% 97% 98%   Weight:    147 lb (66.7 kg)  Height:    5\' 3"  (1.6 m)    Start Time: 0936 hrs. End Time: 0945 hrs. Materials:  Needle(s) Type: Spinal Needle Gauge: 25G Length: 1.5-in Medication(s): Please see orders for medications and dosing details.  Imaging Guidance (Non-Spinal) for procedure #1:  Type of Imaging Technique: Fluoroscopy Guidance (Non-Spinal) Indication(s): Assistance in needle guidance and placement for procedures requiring needle placement in or near specific anatomical locations not easily accessible without such assistance. Exposure Time: Please see nurses notes. Contrast: None used. Fluoroscopic Guidance: I was personally present during the use of fluoroscopy. "Tunnel Vision Technique" used to obtain the best possible view of the target area. Parallax error corrected before commencing the procedure. "Direction-depth-direction" technique used to introduce the needle under continuous pulsed fluoroscopy. Once target was reached, antero-posterior, oblique, and lateral fluoroscopic projection used confirm needle placement in all planes. Images permanently stored in EMR. Interpretation: No contrast injected. I personally interpreted the imaging intraoperatively. Adequate needle placement confirmed in multiple planes. Permanent images saved into the patient's record.  Description of Procedure #2:  Target Area: Medial epicondyle Approach: Medial  approach. Area Prepped: Entire elbow Region Prepping solution: ChloraPrep (2% chlorhexidine gluconate and 70% isopropyl alcohol) Safety Precautions: Aspiration looking for blood return was conducted prior to all injections. At no point did we inject any substances, as a needle was being advanced. No attempts were made at seeking any paresthesias. Safe injection practices and needle disposal techniques used. Medications properly checked for expiration dates. SDV (single dose vial) medications used. Description of the Procedure: Protocol guidelines were followed. The patient was placed in position. The target area was identified and the area prepped in the usual manner. Skin & deeper tissues infiltrated with local anesthetic. Appropriate amount of time allowed to pass for local anesthetics to take effect. The procedure needles were then advanced to the target area. Proper needle placement secured. Negative aspiration confirmed. Solution injected in intermittent fashion, asking for systemic symptoms every 0.5cc of injectate. The needles were then removed and the area cleansed, making sure to leave some of the prepping solution back to take advantage of its long term bactericidal properties.  Vitals:   04/02/18 0934 04/02/18 0939 04/02/18 0944 04/02/18 1102  BP: 129/76 121/89 129/78   Pulse: 76 79 80   Resp: 14 13 14    Temp:      TempSrc:      SpO2: 96% 97% 98%   Weight:    147 lb (66.7 kg)  Height:    5\' 3"  (1.6 m)    Start Time: 0936 hrs. End Time: 0945 hrs. Materials:  Needle(s) Type: Regular needle Gauge: 25G Length: 1.5-in Medication(s): Please see orders for medications and dosing details.  Imaging Guidance for procedure #2:  Type of Imaging Technique: None used Indication(s): N/A Exposure Time: No patient exposure Contrast: None used. Fluoroscopic Guidance: N/A Ultrasound Guidance: N/A Interpretation: N/A  Antibiotic Prophylaxis:   Anti-infectives (From admission, onward)    None     Indication(s): None identified  Post-operative Assessment:  Post-procedure Vital Signs:  Pulse/HCG Rate: 80  Temp: 97.8 F (36.6 C) Resp: 14 BP: 129/78 SpO2: 98 %  EBL: None  Complications: No immediate post-treatment complications observed by team, or reported by patient.  Note: The patient tolerated the entire procedure well. A repeat set of vitals were taken after the procedure and the patient was kept under observation following  institutional policy, for this type of procedure. Post-procedural neurological assessment was performed, showing return to baseline, prior to discharge. The patient was provided with post-procedure discharge instructions, including a section on how to identify potential problems. Should any problems arise concerning this procedure, the patient was given instructions to immediately contact us, at any time, without hesitation. In any case, we plan to contact the patient by telephone for a follow-up status report regarding this interventional procedure.  Comments:  No additional relevant information.  Plan of Care  Orders:  Orders Placed This Encounter  Procedures  . SHOULDER INJECTION    Scheduling Instructions:     Procedure: Acromioclavicular joint injection     Side: Right-sided     Sedation: With Sedation.     Timeframe: Today    Order Specific Question:   Where will this procedure be performed?    Answer:   [facility]    Comments:   by Dr. Dossie Arbour  . Medium Joint Injection/Arthrocentesis    Level(s): Medial epicondyle, left elbow injection Laterality: Left-sided Sedation: With Sedation Purpose: Diagnostic Indication(s): Sub-acute pain    Standing Status:   Future    Standing Expiration Date:   10/03/2019    Scheduling Instructions:     Requested Scheduling Timeframe: Today  . Provider attestation of informed consent for procedure/surgical case    I, the ordering provider, attest that I have discussed with the patient the  benefits, risks, side effects, alternatives, likelihood of achieving goals and potential problems during recovery for the procedure that I have provided informed consent.  . Informed Consent Details: Transcribe to consent form and obtain patient signature    Consent Attestation: I, the ordering provider, attest that I have discussed with the patient the benefits, risks, side-effects, alternatives, likelihood of achieving goals, and potential problems during recovery for the procedure that I have provided informed consent.    Scheduling Instructions:     Procedure: Left medial epicondyle elbow injection + right acromioclavicular joint injection     Surgeon: Ferris Tally A. Dossie Arbour, MD     Indications: Left elbow medial epicondylitis and right acromioclavicular joint pain   Medications ordered for procedure: Meds ordered this encounter  Medications  . lidocaine (XYLOCAINE) 2 % (with pres) injection 400 mg  . midazolam (VERSED) 5 MG/5ML injection 1-2 mg    Make sure Flumazenil is available in the pyxis when using this medication. If oversedation occurs, administer 0.2 mg IV over 15 sec. If after 45 sec no response, administer 0.2 mg again over 1 min; may repeat at 1 min intervals; not to exceed 4 doses (1 mg)  . fentaNYL (SUBLIMAZE) injection 25-50 mcg    Make sure Narcan is available in the pyxis when using this medication. In the event of respiratory depression (RR< 8/min): Titrate NARCAN (naloxone) in increments of 0.1 to 0.2 mg IV at 2-3 minute intervals, until desired degree of reversal.  . lactated ringers infusion 1,000 mL  . methylPREDNISolone acetate (DEPO-MEDROL) injection 80 mg  . ropivacaine (PF) 2 mg/mL (0.2%) (NAROPIN) injection 4 mL  . methylPREDNISolone acetate (DEPO-MEDROL) injection 80 mg  . ropivacaine (PF) 2 mg/mL (0.2%) (NAROPIN) injection 4 mL   Medications administered: We administered lidocaine, midazolam, fentaNYL, lactated ringers, methylPREDNISolone acetate, ropivacaine  (PF) 2 mg/mL (0.2%), methylPREDNISolone acetate, and ropivacaine (PF) 2 mg/mL (0.2%).  See the medical record for exact dosing, route, and time of administration.  Disposition: Discharge home  Discharge Date & Time: 04/02/2018; 1010 hrs.   Follow-up plan:  Return for PPE (2 wks) w/ Dr. Dossie Arbour.     Future Appointments  Date Time Provider [place]  06/24/2018  9:30 AM Vevelyn Francois, NP [facility] None   Primary Care Physician: Ricardo Jericho, NP Location: [facility] Note by: Gaspar Cola, MD Date: 04/02/2018; Time: 1:20 PM  Disclaimer:  Medicine is not an exact science. The only guarantee in medicine is that nothing is guaranteed. It is important to note that the decision to proceed with this intervention was based on the information collected from the patient. The Data and conclusions were drawn from the patient's questionnaire, the interview, and the physical examination. Because the information was provided in large part by the patient, it cannot be guaranteed that it has not been purposely or unconsciously manipulated. Every effort has been made to obtain as much relevant data as possible for this evaluation. It is important to note that the conclusions that lead to this procedure are derived in large part from the available data. Always take into account that the treatment will also be dependent on availability of resources and existing treatment guidelines, considered by other Pain Management Practitioners as being common knowledge and practice, at the time of the intervention. For Medico-Legal purposes, it is also important to point out that variation in procedural techniques and pharmacological choices are the acceptable norm. The indications, contraindications, technique, and results of the above procedure should only be interpreted and judged by a Board-Certified Interventional Pain Specialist with extensive familiarity and expertise in  the same exact procedure and technique.

## 2018-04-03 ENCOUNTER — Telehealth: Payer: Self-pay

## 2018-04-03 NOTE — Telephone Encounter (Signed)
Denies any needs at this time. States she is feeling better today. Instructed to call if needed

## 2018-06-17 ENCOUNTER — Telehealth: Payer: Self-pay | Admitting: *Deleted

## 2018-06-17 ENCOUNTER — Ambulatory Visit: Payer: Medicaid Other

## 2018-06-17 ENCOUNTER — Ambulatory Visit
Admission: EM | Admit: 2018-06-17 | Discharge: 2018-06-17 | Disposition: A | Discharge status: Home / Self Care | Payer: Medicaid Other | Attending: Emergency Medicine | Admitting: Emergency Medicine

## 2018-06-17 ENCOUNTER — Other Ambulatory Visit: Payer: Self-pay

## 2018-06-17 ENCOUNTER — Encounter: Payer: Self-pay | Admitting: Emergency Medicine

## 2018-06-17 ENCOUNTER — Other Ambulatory Visit: Payer: Medicaid Other

## 2018-06-17 DIAGNOSIS — R059 Cough, unspecified: Secondary | ICD-10-CM

## 2018-06-17 DIAGNOSIS — R05 Cough: Secondary | ICD-10-CM | POA: Insufficient documentation

## 2018-06-17 DIAGNOSIS — F17210 Nicotine dependence, cigarettes, uncomplicated: Secondary | ICD-10-CM

## 2018-06-17 DIAGNOSIS — Z7189 Other specified counseling: Secondary | ICD-10-CM | POA: Insufficient documentation

## 2018-06-17 DIAGNOSIS — R0981 Nasal congestion: Secondary | ICD-10-CM | POA: Diagnosis not present

## 2018-06-17 DIAGNOSIS — Z20822 Contact with and (suspected) exposure to covid-19: Secondary | ICD-10-CM

## 2018-06-17 DIAGNOSIS — J209 Acute bronchitis, unspecified: Secondary | ICD-10-CM | POA: Insufficient documentation

## 2018-06-17 MED ORDER — DOXYCYCLINE HYCLATE 100 MG PO CAPS: 100.0000 mg | ORAL_CAPSULE | Freq: Two times a day (BID) | ORAL | 0 refills | Status: DC

## 2018-06-17 MED ORDER — BENZONATATE 100 MG PO CAPS: 100.0000 mg | ORAL_CAPSULE | Freq: Three times a day (TID) | ORAL | 0 refills | Status: DC | PRN

## 2018-06-17 MED ORDER — PREDNISONE 10 MG PO TABS: ORAL_TABLET | ORAL | 0 refills | Status: DC

## 2018-06-17 MED ORDER — ALBUTEROL SULFATE HFA 108 (90 BASE) MCG/ACT IN AERS: 2.0000 | INHALATION_SPRAY | RESPIRATORY_TRACT | 0 refills | Status: DC | PRN

## 2018-06-17 NOTE — Telephone Encounter (Signed)
Melissa, Medical Assistant at [facility] called to have pt tested for COVID per Max Sane, NP; the pt can be contacted at [phone]; will attempt to contact pt.

## 2018-06-17 NOTE — Telephone Encounter (Signed)
Contacted pt to schedule testing; pt offered and accepted appointment at [facility] site 06/17/2018 at 1445; pt given address, directions, and instructions that she and all occupants of her vehicle should wear a mask; she verbalized understanding; order placed per protocol.

## 2018-06-17 NOTE — ED Provider Notes (Signed)
[facility] ____________________________________________  Time seen: Approximately 1:49 PM  I have reviewed the triage vital signs and the nursing notes.   HISTORY  Chief Complaint Cough and Fever   HPI Tanya [name] is a 58 y.o. female presenting for evaluation of cough and low-grade fever present for the last 2 weeks.  States that symptoms have been gradual in onset.  Some nasal congestion and postnasal drainage.  States cough is often productive of yellowish-greenish mucus.  Denies hemoptysis.  States feels sore from coughing.  Denies current shortness of breath.  States intermittently shortness of breath with associated cough and wheezing.  States she has a history of similar with bronchitis.  States feels the same as previous bronchitis diagnoses.  States fevers have been coming and going with T-max up to 102.  Has been taken intermittent Tylenol.  Denies other alleviating measures.  Denies aggravating factors.  Denies known sick contacts.  States has been mostly remaining at home.  Denies chest pain, extremity edema, recent immobilization or recent sickness.  Positive smoker.  Denies known history of COPD.  Ricardo Jericho, NP: PCP   Past Medical History:  Diagnosis Date  . Anxiety   . Bladder infection    8/18  . Chronic lower back pain   . Chronic neck pain   . Depression   . Diverticulitis   . Hyperlipidemia   . Lupus ([place])   . Overactive bladder     Patient Active Problem List   Diagnosis Date Noted  . Osteoarthritis of AC (acromioclavicular) joint (Right) 04/02/2018  . Chronic acromioclavicular joint pain (Right) 04/02/2018  . Chronic shoulder pain (Right) 04/02/2018  . Epicondylitis elbow, medial (Left) 04/02/2018  . Chronic elbow pain (Left) 03/25/2018  . Occipital headache (Left) 12/10/2017  . Neurogenic pain 12/10/2017  . Cervico-occipital neuralgia (Left) 12/10/2017  . Pharmacologic therapy 09/01/2017  . Disorder of skeletal  system 09/01/2017  . Problems influencing health status 09/01/2017  . Spondylosis without myelopathy or radiculopathy, cervical region 08/05/2017  . Cervicalgia 08/05/2017  . Rash 05/13/2017  . Cervical spondylosis 04/15/2016  . Cervical facet syndrome (Bilateral) (R>L) 04/15/2016  . Shoulder radicular pain (Bilateral) (R>L) 04/15/2016  . Musculoskeletal pain 03/07/2016  . Vitamin D insufficiency 12/19/2015  . 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 Anxiety 11/23/2015  . Chronic low back pain [facility] source of pain) (Bilateral) (R>L) 11/23/2015  . Chronic neck pain (Primary Source of Pain) (Bilateral) (R>L) 11/23/2015  . Long term prescription benzodiazepine use 11/23/2015  . Long term current use of opiate analgesic 11/23/2015  . Long term prescription opiate use 11/23/2015  . Opiate use (10 MME/Day) 11/23/2015  . Chronic pain syndrome 11/23/2015  . Chronic upper extremity pain (Right) 11/23/2015  . Chronic cervical radicular pain (Right) 11/23/2015  . Chronic shoulder pain (Bilateral) (R>L) 11/23/2015  . Hypercholesterolemia 06/20/2015  . 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 Encounter for long-term (current) use of high-risk medication 06/06/2015  . Pain medication agreement signed 11/24/2014  . Hyperlipidemia 01/25/2014  . 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 Diverticulitis 10/04/2012  . Osteoarthritis 10/04/2012  . Chronic upper back pain (Secondary source of pain) (Bilateral) (R>L) 08/17/2012  . Urinary incontinence 08/17/2012  . Fibromyalgia 06/04/2012  . SLE (systemic lupus erythematosus) ([place]) 02/18/2012  . Generalized anxiety disorder 01/07/2012  . Major depressive disorder, recurrent ([place]) 09/29/2009    Past Surgical History:  Procedure Laterality Date  . ABDOMINAL HYSTERECTOMY    . COLONOSCOPY    . COLONOSCOPY WITH  PROPOFOL N/A 07/03/2017   Procedure: COLONOSCOPY WITH PROPOFOL;  Surgeon: [name], MD;  Location: [facility] ENDOSCOPY;   Service: Endoscopy;  Laterality: N/A;  . COLONOSCOPY WITH PROPOFOL N/A 10/21/2017   Procedure: COLONOSCOPY WITH PROPOFOL;  Surgeon: [name], MD;  Location: [facility] ENDOSCOPY;  Service: Endoscopy;  Laterality: N/A;  . ESOPHAGOGASTRODUODENOSCOPY N/A 10/21/2017   Procedure: ESOPHAGOGASTRODUODENOSCOPY (EGD);  Surgeon: [name], MD;  Location: [facility] ENDOSCOPY;  Service: Endoscopy;  Laterality: N/A;  . TONSILLECTOMY       No current facility-administered medications for this encounter.   Current Outpatient Medications:  .  cyclobenzaprine (FLEXERIL) 10 MG tablet, Take 1 tablet (10 mg total) by mouth 3 (three) times daily as needed for muscle spasms., Disp: 90 tablet, Rfl: 0 .  DULoxetine (CYMBALTA) 30 MG capsule, Take 30 mg by mouth daily., Disp: , Rfl:  .  gabapentin (NEURONTIN) 300 MG capsule, Take 1 capsule (300 mg total) by mouth 3 (three) times daily., Disp: 90 capsule, Rfl: 2 .  hydroxychloroquine (PLAQUENIL) 200 MG tablet, Take by mouth., Disp: , Rfl:  .  oxyCODONE (OXY IR/ROXICODONE) 5 MG immediate release tablet, Take 1 tablet (5 mg total) by mouth 2 (two) times daily for 30 days., Disp: 60 tablet, Rfl: 0 .  pantoprazole (PROTONIX) 40 MG tablet, Take 40 mg by mouth daily., Disp: , Rfl:  .  albuterol (VENTOLIN HFA) 108 (90 Base) MCG/ACT inhaler, Inhale 2 puffs into the lungs every 4 (four) hours as needed., Disp: 1 Inhaler, Rfl: 0 .  benzonatate (TESSALON PERLES) 100 MG capsule, Take 1 capsule (100 mg total) by mouth 3 (three) times daily as needed for cough., Disp: 15 capsule, Rfl: 0 .  doxycycline (VIBRAMYCIN) 100 MG capsule, Take 1 capsule (100 mg total) by mouth 2 (two) times daily., Disp: 20 capsule, Rfl: 0 .  DULoxetine (CYMBALTA) 30 MG capsule, TAKE 1 CAPSULE(30 MG) BY MOUTH EVERY DAY, Disp: , Rfl:  .  oxyCODONE (OXY IR/ROXICODONE) 5 MG immediate release tablet, Take 1 tablet (5 mg total) by mouth 2 (two) times daily for 30 days., Disp: 60 tablet, Rfl: 0 .  oxyCODONE  (OXY IR/ROXICODONE) 5 MG immediate release tablet, Take 1 tablet (5 mg total) by mouth 2 (two) times daily for 30 days., Disp: 60 tablet, Rfl: 0 .  predniSONE (DELTASONE) 10 MG tablet, Start 60 mg po day one, then 50 mg po day two, taper by 10 mg daily until complete., Disp: 21 tablet, Rfl: 0  Allergies Trazodone; Bupropion; Methocarbamol; Oxycodone-acetaminophen; Tramadol; and Trazodone and nefazodone  Family History  Problem Relation Age of Onset  . Cancer Mother   . Hypertension Mother   . Emphysema Mother   . Stroke Mother   . Glaucoma Father   . Heart disease Father   . Diabetes Brother     Social History Social History   Tobacco Use  . Smoking status: Current Every Day Smoker    Packs/day: 0.50    Years: 35.00    Pack years: 17.50    Types: Cigarettes  . Smokeless tobacco: Never Used  Substance Use Topics  . Alcohol use: No  . Drug use: No    Review of Systems Constitutional: Positive fever Eyes: No visual changes. ENT: No sore throat.  Positive nasal congestion. Cardiovascular: Denies chest pain. Respiratory: As above.  Gastrointestinal: No abdominal pain.  Musculoskeletal: Negative for back pain. Skin: Negative for rash.  ____________________________________________   PHYSICAL EXAM:  VITAL SIGNS: ED Triage Vitals  Enc Vitals Group  BP 06/17/18 1258 136/85     Pulse Rate 06/17/18 1258 86     Resp 06/17/18 1258 16     Temp 06/17/18 1258 97.9 F (36.6 C)     Temp Source 06/17/18 1258 Oral     SpO2 06/17/18 1258 96 %     Weight 06/17/18 1255 147 lb (66.7 kg)     Height 06/17/18 1255 5\' 3"  (1.6 m)     Head Circumference --      Peak Flow --      Pain Score 06/17/18 1255 8     Pain Loc --      Pain Edu? --      Excl. in [place]? --     Constitutional: Alert and oriented.  Eyes: Conjunctivae are normal.  Head: Atraumatic. No sinus tenderness to palpation. No swelling. No erythema.  Ears: no erythema, normal TMs bilaterally.   Nose:Nasal congestion   Mouth/Throat: Mucous membranes are moist.  Neck: No stridor.  No cervical spine tenderness to palpation. Hematological/Lymphatic/Immunilogical: No cervical lymphadenopathy. Cardiovascular: Normal rate, regular rhythm. Grossly normal heart sounds.  Good peripheral circulation. Respiratory: Normal respiratory effort.  No retractions. Mild scattered rhonchi. Mild dry cough with wheezing.   Musculoskeletal: Ambulatory with steady gait. No cervical, thoracic or lumbar tenderness to palpation. Neurologic:  Normal speech and language. No gait instability. Skin:  Skin appears warm, dry and intact. No rash noted. Psychiatric: Mood and affect are normal. Speech and behavior are normal. ___________________________________________   LABS (all labs ordered are listed, but only abnormal results are displayed)  Labs Reviewed - No data to display ____________________________________________  RADIOLOGY  Dg Chest 2 View  Result Date: 06/17/2018 CLINICAL DATA:  Cough.  Low-grade fever. EXAM: CHEST - 2 VIEW COMPARISON:  No recent relevant prior available for comparison. FINDINGS: The heart size and mediastinal contours are within normal limits. Both lungs are clear. The visualized skeletal structures are unremarkable. IMPRESSION: No active cardiopulmonary disease. Electronically Signed   By: Constance Holster M.D.   On: 06/17/2018 13:26   ____________________________________________   PROCEDURES Procedures     INITIAL IMPRESSION / ASSESSMENT AND PLAN / ED COURSE  Pertinent labs & imaging results that were available during my care of the patient were reviewed by me and considered in my medical decision making (see chart for details).  Overall well-appearing patient, but does appear ill.  Vital signs stable.  History of bronchitis, positive smoker.  Chest x-ray as above radiologist, no active cardiopulmonary disease.  Recommend COVID-19 testing for patient and advice given, please adhere to [facility]  information, information given.  COVID testing ordered for today, patient agrees to this.  Will treat with oral doxycycline, prednisone taper, PRN albuterol inhaler and PRN Tessalon Perles.  Encourage rest, fluids, supportive care.  Continue over-the-counter Tylenol as needed for fever.  Discussed proceed directly to emergency room for any chest pain, shortness of breath or worsening concerns.Discussed indication, risks and benefits of medications with patient.  Discussed follow up with Primary care physician this week. Discussed follow up and return parameters including no resolution or any worsening concerns. Patient verbalized understanding and agreed to plan.   ____________________________________________   FINAL CLINICAL IMPRESSION(S) / ED DIAGNOSES  Final diagnoses:  Cough  Acute bronchitis, unspecified organism  Advice Given About Covid-19 Virus Infection     ED Discharge Orders         Ordered    doxycycline (VIBRAMYCIN) 100 MG capsule  2 times daily     06/17/18  1347    predniSONE (DELTASONE) 10 MG tablet     06/17/18 1347    albuterol (VENTOLIN HFA) 108 (90 Base) MCG/ACT inhaler  Every 4 hours PRN     06/17/18 1347    benzonatate (TESSALON PERLES) 100 MG capsule  3 times daily PRN     06/17/18 1347           Note: This dictation was prepared with Dragon dictation along with smaller phrase technology. Any transcriptional errors that result from this process are unintentional.         Marylene Land, NP 06/17/18 1437

## 2018-06-17 NOTE — Discharge Instructions (Addendum)
Take medication as prescribed. Rest. Drink plenty of fluids. Tylenol as needed.   Covid 19 testing as directed. Follow [place] guidelines. Remain home until further directed.   Follow up with your primary care physician this week. Return to Urgent care as needed.  Proceed directly to the emergency room for chest pain, shortness of breath or worsening concerns.

## 2018-06-17 NOTE — ED Triage Notes (Signed)
Patient c/o cough and low grade fever that started 2 weeks ago.

## 2018-06-18 LAB — NOVEL CORONAVIRUS, NAA: SARS-CoV-2, NAA: NOT DETECTED

## 2018-06-19 ENCOUNTER — Telehealth: Payer: Self-pay | Admitting: Internal Medicine

## 2018-06-19 NOTE — Telephone Encounter (Signed)
Results of SARS-CoV-2 given to patient. Lab stating not detected. Pt voiced understanding.

## 2018-06-24 ENCOUNTER — Encounter: Payer: Medicaid Other | Admitting: Pain Medicine

## 2018-06-24 ENCOUNTER — Encounter: Payer: Medicaid Other | Admitting: Nurse Practitioner

## 2018-06-24 ENCOUNTER — Encounter: Payer: Self-pay | Admitting: Pain Medicine

## 2018-06-25 ENCOUNTER — Other Ambulatory Visit: Payer: Self-pay

## 2018-06-25 ENCOUNTER — Telehealth: Payer: Self-pay | Admitting: *Deleted

## 2018-06-25 ENCOUNTER — Ambulatory Visit: Payer: Medicaid Other | Attending: Nurse Practitioner | Admitting: Pain Medicine

## 2018-06-25 DIAGNOSIS — M542 Cervicalgia: Secondary | ICD-10-CM

## 2018-06-25 DIAGNOSIS — M797 Fibromyalgia: Secondary | ICD-10-CM

## 2018-06-25 DIAGNOSIS — G894 Chronic pain syndrome: Secondary | ICD-10-CM

## 2018-06-25 DIAGNOSIS — M7918 Myalgia, other site: Secondary | ICD-10-CM

## 2018-06-25 DIAGNOSIS — M545 Low back pain: Secondary | ICD-10-CM | POA: Diagnosis not present

## 2018-06-25 DIAGNOSIS — G8929 Other chronic pain: Secondary | ICD-10-CM

## 2018-06-25 DIAGNOSIS — M792 Neuralgia and neuritis, unspecified: Secondary | ICD-10-CM

## 2018-06-25 DIAGNOSIS — M549 Dorsalgia, unspecified: Secondary | ICD-10-CM | POA: Diagnosis not present

## 2018-06-25 MED ORDER — OXYCODONE HCL 5 MG PO TABS: 5.0000 mg | ORAL_TABLET | Freq: Two times a day (BID) | ORAL | 0 refills | Status: DC

## 2018-06-25 MED ORDER — GABAPENTIN 300 MG PO CAPS: 300.0000 mg | ORAL_CAPSULE | Freq: Three times a day (TID) | ORAL | 2 refills | Status: DC

## 2018-06-25 MED ORDER — CYCLOBENZAPRINE HCL 5 MG PO TABS: 5.0000 mg | ORAL_TABLET | Freq: Three times a day (TID) | ORAL | 2 refills | Status: DC | PRN

## 2018-06-25 NOTE — Progress Notes (Signed)
Pain Management Virtual Encounter Note - Virtual Visit via Telephone Telehealth (real-time audio visits between healthcare provider and patient).   Patient's Phone No. & Preferred Pharmacy:  [phone] (home); [phone] (mobile); (Preferred) [phone] [email or web address]  [id number] - [place], [place] - [address] [place] [address] Phone: [phone] Fax: [phone]    Pre-screening note:  Our staff contacted Tanya [name] and offered her an "in person", "face-to-face" appointment versus a telephone encounter. She indicated preferring the telephone encounter, at this time.   Reason for Virtual Visit: COVID-19*  Social distancing based on CDC and AMA recommendations.   I contacted Tanya [name] on 06/25/2018 at 2:36 PM via telephone.      I clearly identified myself as Gaspar Cola, MD. I verified that I was speaking with the correct person using two identifiers (Name: Tanya [name], and date of birth: 12-26-1960).  Advanced Informed Consent I sought verbal advanced consent from Tanya [name] for virtual visit interactions. I informed Tanya [name] of possible security and privacy concerns, risks, and limitations associated with providing "not-in-person" medical evaluation and management services. I also informed Tanya [name] of the availability of "in-person" appointments. Finally, I informed her that there would be a charge for the virtual visit and that she could be  personally, fully or partially, financially responsible for it. Tanya [name] expressed understanding and agreed to proceed.   Historic Elements   Tanya [name] is a 58 y.o. year old, female patient evaluated today after her last encounter by our practice on 04/03/2018. Tanya [name]  has a past medical history of Anxiety, Bladder infection, Chronic lower back pain, Chronic neck pain, Depression, Diverticulitis, Hyperlipidemia, Lupus ([place]), and Overactive  bladder. She also  has a past surgical history that includes Abdominal hysterectomy; Colonoscopy; Colonoscopy with propofol (N/A, 07/03/2017); Tonsillectomy; Esophagogastroduodenoscopy (N/A, 10/21/2017); and Colonoscopy with propofol (N/A, 10/21/2017). Tanya [name] has a current medication list which includes the following prescription(s): albuterol, benzonatate, cyclobenzaprine, doxycycline, gabapentin, oxycodone, oxycodone, oxycodone, and prednisone. She  reports that she has been smoking cigarettes. She has a 17.50 pack-year smoking history. She has never used smokeless tobacco. She reports that she does not drink alcohol or use drugs. Tanya [name] is allergic to trazodone; bupropion; methocarbamol; oxycodone-acetaminophen; tramadol; and trazodone and nefazodone.   HPI  Today, she is being contacted for medication management.  Pharmacotherapy Assessment  Analgesic: Oxycodone IR 5 mg 1 tablet p.o. twice daily (10 mg/day of oxycodone) MME/day: 15 mg/day.   Monitoring: Pharmacotherapy: No side-effects or adverse reactions reported. [place] PMP: PDMP reviewed during this encounter.       Compliance: No problems identified. Effectiveness: Clinically acceptable. Plan: Refer to "POC".  Pertinent Labs   SAFETY SCREENING Profile Lab Results  Component Value Date   SARSCOV2NAA Not Detected 06/17/2018   Renal Function Lab Results  Component Value Date   BUN 10 09/03/2017   CREATININE 0.67 09/03/2017   BCR 15 09/03/2017   GFRAA 114 09/03/2017   GFRNONAA 99 09/03/2017   Hepatic Function Lab Results  Component Value Date   AST 15 09/03/2017   ALT 16 09/08/2016   ALBUMIN 4.4 09/03/2017   UDS Summary  Date Value Ref Range Status  03/25/2018 FINAL  Final    Comment:    ==================================================================== TOXASSURE SELECT 13 (MW) ==================================================================== Specimen Alert Note:  Urinary creatinine is low; ability  to detect some drugs may be compromised.  Interpret results with caution. ==================================================================== Test  Result       Flag       Units Drug Present and Declared for Prescription Verification   Oxycodone                      1447         EXPECTED   ng/mg creat   Oxymorphone                    753          EXPECTED   ng/mg creat   Noroxycodone                   2053         EXPECTED   ng/mg creat    Sources of oxycodone include scheduled prescription medications.    Oxymorphone and noroxycodone are expected metabolites of    oxycodone. Oxymorphone is also available as a scheduled    prescription medication. Drug Absent but Declared for Prescription Verification   Tramadol                       Not Detected UNEXPECTED ng/mg creat ==================================================================== Test                      Result    Flag   Units      Ref Range   Creatinine              17        L      mg/dL      >=20 ==================================================================== Declared Medications:  The flagging and interpretation on this report are based on the  following declared medications.  Unexpected results may arise from  inaccuracies in the declared medications.  **Note: The testing scope of this panel includes these medications:  Oxycodone  Oxycodone (Oxycodone Acetaminophen)  Tramadol  **Note: The testing scope of this panel does not include following  reported medications:  Acetaminophen (Oxycodone Acetaminophen)  Bupropion  Cyclobenzaprine  Duloxetine  Gabapentin  Hydroxychloroquine  Methocarbamol  Nefazodone  Pantoprazole  Trazodone ==================================================================== For clinical consultation, please call [phone]. ====================================================================    Note: Above Lab results reviewed.  Recent imaging  DG  Chest 2 View CLINICAL DATA:  Cough.  Low-grade fever.  EXAM: CHEST - 2 VIEW  COMPARISON:  No recent relevant prior available for comparison.  FINDINGS: The heart size and mediastinal contours are within normal limits. Both lungs are clear. The visualized skeletal structures are unremarkable.  IMPRESSION: No active cardiopulmonary disease.  Electronically Signed   By: Constance Holster M.D.   On: 06/17/2018 13:26  Assessment  The primary encounter diagnosis was Chronic pain syndrome. Diagnoses of Chronic neck pain (Primary Source of Pain) (Bilateral) (R>L), Chronic upper back pain (Secondary source of pain) (Bilateral) (R>L), Chronic low back pain [facility] source of pain) (Bilateral) (R>L), Fibromyalgia, Neurogenic pain, and Chronic musculoskeletal pain were also pertinent to this visit.  Plan of Care  I have discontinued Tanya [name]'s DULoxetine, DULoxetine, hydroxychloroquine, pantoprazole, and cyclobenzaprine. I have also changed her oxyCODONE, oxyCODONE, oxyCODONE, and cyclobenzaprine. Additionally, I am having her maintain her doxycycline, predniSONE, albuterol, benzonatate, and gabapentin.  Pharmacotherapy (Medications Ordered): Meds ordered this encounter  Medications  . gabapentin (NEURONTIN) 300 MG capsule    Sig: Take 1 capsule (300 mg total) by mouth 3 (three) times daily.    Dispense:  90 capsule    Refill:  2  Fill one day early if pharmacy is closed on scheduled refill date. May substitute for generic if available.  Marland Kitchen oxyCODONE (OXY IR/ROXICODONE) 5 MG immediate release tablet    Sig: Take 1 tablet (5 mg total) by mouth 2 (two) times daily for 30 days. Must last 30 days    Dispense:  60 tablet    Refill:  0    Chronic Pain: STOP Act - Not applicable. Fill 1 day early if closed on scheduled refill date. Do not fill until: 08/24/2018. To last until: 09/23/2018. Instruct to avoid benzodiazepines within 8 hours of opioid.  Marland Kitchen oxyCODONE (OXY IR/ROXICODONE) 5 MG  immediate release tablet    Sig: Take 1 tablet (5 mg total) by mouth 2 (two) times daily for 30 days. Must last 30 days    Dispense:  60 tablet    Refill:  0    Chronic Pain: STOP Act - Not applicable. Fill 1 day early if closed on scheduled refill date. Do not fill until: 07/25/2018. To last until: 08/24/2018. Instruct to avoid benzodiazepines within 8 hours of opioid.  Marland Kitchen oxyCODONE (OXY IR/ROXICODONE) 5 MG immediate release tablet    Sig: Take 1 tablet (5 mg total) by mouth 2 (two) times daily for 30 days. Must last 30 days    Dispense:  60 tablet    Refill:  0    Chronic Pain: STOP Act - Not applicable. Fill 1 day early if closed on scheduled refill date. Do not fill until: 06/25/2018. To last until: 07/25/2018. Instruct to avoid benzodiazepines within 8 hours of opioid.  . cyclobenzaprine (FLEXERIL) 5 MG tablet    Sig: Take 1 tablet (5 mg total) by mouth 3 (three) times daily as needed for muscle spasms. Must last 30 days    Dispense:  90 tablet    Refill:  2    Fill one day early if pharmacy is closed on scheduled refill date. May substitute for generic if available.   Orders:  No orders of the defined types were placed in this encounter.  Follow-up plan:   Return in about 3 months (around 09/21/2018) for (3 mo) Med-Mgmt, (Virtual Visit).    I discussed the assessment and treatment plan with the patient. The patient was provided an opportunity to ask questions and all were answered. The patient agreed with the plan and demonstrated an understanding of the instructions.  Patient advised to call back or seek an in-person evaluation if the symptoms or condition worsens.  Total duration of non-face-to-face encounter: 12 minutes.  Note by: Gaspar Cola, MD Date: 06/25/2018; Time: 2:48 PM  Note: This dictation was prepared with Dragon dictation. Any transcriptional errors that may result from this process are unintentional.  Disclaimer:  * Given the special circumstances of the COVID-19  pandemic, the federal government has announced that the Office for Civil Rights (OCR) will exercise its enforcement discretion and will not impose penalties on physicians using telehealth in the event of noncompliance with regulatory requirements under the [place] (HIPAA) in connection with the good faith provision of telehealth during the OZDGU-44 national public health emergency. ([place])

## 2018-06-25 NOTE — Patient Instructions (Signed)
____________________________________________________________________________________________  Medication Rules  Purpose: To inform patients, and their family members, of our rules and regulations.  Applies to: All patients receiving prescriptions (written or electronic).  Pharmacy of record: Pharmacy where electronic prescriptions will be sent. If written prescriptions are taken to a different pharmacy, please inform the nursing staff. The pharmacy listed in the electronic medical record should be the one where you would like electronic prescriptions to be sent.  Electronic prescriptions: In compliance with the [place] Strengthen Opioid Misuse Prevention (STOP) Act of 2017 (Session Law 2017-74/H243), effective January 21, 2018, all controlled substances must be electronically prescribed. Calling prescriptions to the pharmacy will cease to exist.  Prescription refills: Only during scheduled appointments. Applies to all prescriptions.  NOTE: The following applies primarily to controlled substances (Opioid* Pain Medications).   Patient's responsibilities: 1. Pain Pills: Bring all pain pills to every appointment (except for procedure appointments). 2. Pill Bottles: Bring pills in original pharmacy bottle. Always bring the newest bottle. Bring bottle, even if empty. 3. Medication refills: You are responsible for knowing and keeping track of what medications you take and those you need refilled. The day before your appointment: write a list of all prescriptions that need to be refilled. The day of the appointment: give the list to the admitting nurse. Prescriptions will be written only during appointments. No prescriptions will be written on procedure days. If you forget a medication: it will not be "Called in", "Faxed", or "electronically sent". You will need to get another appointment to get these prescribed. No early refills. Do not call asking to have your prescription filled  early. 4. Prescription Accuracy: You are responsible for carefully inspecting your prescriptions before leaving our office. Have the discharge nurse carefully go over each prescription with you, before taking them home. Make sure that your name is accurately spelled, that your address is correct. Check the name and dose of your medication to make sure it is accurate. Check the number of pills, and the written instructions to make sure they are clear and accurate. Make sure that you are given enough medication to last until your next medication refill appointment. 5. Taking Medication: Take medication as prescribed. When it comes to controlled substances, taking less pills or less frequently than prescribed is permitted and encouraged. Never take more pills than instructed. Never take medication more frequently than prescribed.  6. Inform other Doctors: Always inform, all of your healthcare providers, of all the medications you take. 7. Pain Medication from other Providers: You are not allowed to accept any additional pain medication from any other Doctor or Healthcare provider. There are two exceptions to this rule. (see below) In the event that you require additional pain medication, you are responsible for notifying us, as stated below. 8. Medication Agreement: You are responsible for carefully reading and following our Medication Agreement. This must be signed before receiving any prescriptions from our practice. Safely store a copy of your signed Agreement. Violations to the Agreement will result in no further prescriptions. (Additional copies of our Medication Agreement are available upon request.) 9. Laws, Rules, & Regulations: All patients are expected to follow all Federal and State Laws, Statutes, Rules, & Regulations. Ignorance of the Laws does not constitute a valid excuse. The use of any illegal substances is prohibited. 10. Adopted CDC guidelines & recommendations: Target dosing levels will be  at or below 60 MME/day. Use of benzodiazepines** is not recommended.  Exceptions: There are only two exceptions to the rule of not   receiving pain medications from other Healthcare Providers. 1. Exception #1 (Emergencies): In the event of an emergency (i.e.: accident requiring emergency care), you are allowed to receive additional pain medication. However, you are responsible for: As soon as you are able, call our office [phone], at any time of the day or night, and leave a message stating your name, the date and nature of the emergency, and the name and dose of the medication prescribed. In the event that your call is answered by a member of our staff, make sure to document and save the date, time, and the name of the person that took your information.  2. Exception #2 (Planned Surgery): In the event that you are scheduled by another doctor or dentist to have any type of surgery or procedure, you are allowed (for a period no longer than 30 days), to receive additional pain medication, for the acute post-op pain. However, in this case, you are responsible for picking up a copy of our "Post-op Pain Management for Surgeons" handout, and giving it to your surgeon or dentist. This document is available at our office, and does not require an appointment to obtain it. Simply go to our office during business hours (Monday-Thursday from 8:00 AM to 4:00 PM) (Friday 8:00 AM to 12:00 Noon) or if you have a scheduled appointment with us, prior to your surgery, and ask for it by name. In addition, you will need to provide us with your name, name of your surgeon, type of surgery, and date of procedure or surgery.  *Opioid medications include: morphine, codeine, oxycodone, oxymorphone, hydrocodone, hydromorphone, meperidine, tramadol, tapentadol, buprenorphine, fentanyl, methadone. **Benzodiazepine medications include: diazepam (Valium), alprazolam (Xanax), clonazepam (Klonopine), lorazepam (Ativan), clorazepate  (Tranxene), chlordiazepoxide (Librium), estazolam (Prosom), oxazepam (Serax), temazepam (Restoril), triazolam (Halcion) (Last updated: 03/20/2017) ____________________________________________________________________________________________   ____________________________________________________________________________________________  Medication Recommendations and Reminders  Applies to: All patients receiving prescriptions (written and/or electronic).  Medication Rules & Regulations: These rules and regulations exist for your safety and that of others. They are not flexible and neither are we. Dismissing or ignoring them will be considered "non-compliance" with medication therapy, resulting in complete and irreversible termination of such therapy. (See document titled "Medication Rules" for more details.) In all conscience, because of safety reasons, we cannot continue providing a therapy where the patient does not follow instructions.  Pharmacy of record:   Definition: This is the pharmacy where your electronic prescriptions will be sent.   We do not endorse any particular pharmacy.  You are not restricted in your choice of pharmacy.  The pharmacy listed in the electronic medical record should be the one where you want electronic prescriptions to be sent.  If you choose to change pharmacy, simply notify our nursing staff of your choice of new pharmacy.  Recommendations:  Keep all of your pain medications in a safe place, under lock and key, even if you live alone.   After you fill your prescription, take 1 week's worth of pills and put them away in a safe place. You should keep a separate, properly labeled bottle for this purpose. The remainder should be kept in the original bottle. Use this as your primary supply, until it runs out. Once it's gone, then you know that you have 1 week's worth of medicine, and it is time to come in for a prescription refill. If you do this correctly, it  is unlikely that you will ever run out of medicine.  To make sure that the above recommendation works,   it is very important that you make sure your medication refill appointments are scheduled at least 1 week before you run out of medicine. To do this in an effective manner, make sure that you do not leave the office without scheduling your next medication management appointment. Always ask the nursing staff to show you in your prescription , when your medication will be running out. Then arrange for the receptionist to get you a return appointment, at least 7 days before you run out of medicine. Do not wait until you have 1 or 2 pills left, to come in. This is very poor planning and does not take into consideration that we may need to cancel appointments due to bad weather, sickness, or emergencies affecting our staff.  "Partial Fill": If for any reason your pharmacy does not have enough pills/tablets to completely fill or refill your prescription, do not allow for a "partial fill". You will need a separate prescription to fill the remaining amount, which we will not provide. If the reason for the partial fill is your insurance, you will need to talk to the pharmacist about payment alternatives for the remaining tablets, but again, do not accept a partial fill.  Prescription refills and/or changes in medication(s):   Prescription refills, and/or changes in dose or medication, will be conducted only during scheduled medication management appointments. (Applies to both, written and electronic prescriptions.)  No refills on procedure days. No medication will be changed or started on procedure days. No changes, adjustments, and/or refills will be conducted on a procedure day. Doing so will interfere with the diagnostic portion of the procedure.  No phone refills. No medications will be "called into the pharmacy".  No Fax refills.  No weekend refills.  No Holliday refills.  No after hours  refills.  Remember:  Business hours are:  Monday to Thursday 8:00 AM to 4:00 PM Provider's Schedule: Crystal King, NP - Appointments are:  Medication management: Monday to Thursday 8:00 AM to 4:00 PM Leshaun Biebel, MD - Appointments are:  Medication management: Monday and Wednesday 8:00 AM to 4:00 PM Procedure day: Tuesday and Thursday 7:30 AM to 4:00 PM Bilal Lateef, MD - Appointments are:  Medication management: Tuesday and Thursday 8:00 AM to 4:00 PM Procedure day: Monday and Wednesday 7:30 AM to 4:00 PM (Last update: 03/20/2017) ____________________________________________________________________________________________   ____________________________________________________________________________________________  CANNABIDIOL (AKA: CBD Oil or Pills)  Applies to: All patients receiving prescriptions of controlled substances (written and/or electronic).  General Information: Cannabidiol (CBD) was discovered in 1940. It is one of some 113 identified cannabinoids in cannabis (Marijuana) plants, accounting for up to 40% of the plant's extract. As of 2018, preliminary clinical research on cannabidiol included studies of anxiety, cognition, movement disorders, and pain.  Cannabidiol is consummed in multiple ways, including inhalation of cannabis smoke or vapor, as an aerosol spray into the cheek, and by mouth. It may be supplied as CBD oil containing CBD as the active ingredient (no added tetrahydrocannabinol (THC) or terpenes), a full-plant CBD-dominant hemp extract oil, capsules, dried cannabis, or as a liquid solution. CBD is thought not have the same psychoactivity as THC, and may affect the actions of THC. Studies suggest that CBD may interact with different biological targets, including cannabinoid receptors and other neurotransmitter receptors. As of 2018 the mechanism of action for its biological effects has not been determined.  In the [place], cannabidiol has a limited  approval by the Food and Drug Administration (FDA) for treatment of only two types   of epilepsy disorders. The side effects of long-term use of the drug include somnolence, decreased appetite, diarrhea, fatigue, malaise, weakness, sleeping problems, and others.  CBD remains a Schedule I drug prohibited for any use.  Legality: Some manufacturers ship CBD products nationally, an illegal action which the FDA has not enforced in 2018, with CBD remaining the subject of an FDA investigational new drug evaluation, and is not considered legal as a dietary supplement or food ingredient as of December 2018. Federal illegality has made it difficult historically to conduct research on CBD. CBD is openly sold in head shops and health food stores in some states where such sales have not been explicitly legalized.  Warning: Because it is not FDA approved for general use or treatment of pain, it is not required to undergo the same manufacturing controls as prescription drugs.  This means that the available cannabidiol (CBD) may be contaminated with THC.  If this is the case, it will trigger a positive urine drug screen (UDS) test for cannabinoids (Marijuana).  Because a positive UDS for illicit substances is a violation of our medication agreement, your opioid analgesics (pain medicine) may be permanently discontinued. (Last update: 04/10/2017) ____________________________________________________________________________________________    

## 2018-09-16 ENCOUNTER — Ambulatory Visit: Payer: Medicaid Other | Admitting: Pain Medicine

## 2018-09-17 ENCOUNTER — Encounter: Payer: Self-pay | Admitting: Pain Medicine

## 2018-09-21 ENCOUNTER — Ambulatory Visit: Payer: Medicaid Other | Attending: Pain Medicine | Admitting: Pain Medicine

## 2018-09-21 ENCOUNTER — Other Ambulatory Visit: Payer: Self-pay

## 2018-09-21 DIAGNOSIS — M797 Fibromyalgia: Secondary | ICD-10-CM

## 2018-09-21 DIAGNOSIS — M25512 Pain in left shoulder: Secondary | ICD-10-CM

## 2018-09-21 DIAGNOSIS — M792 Neuralgia and neuritis, unspecified: Secondary | ICD-10-CM

## 2018-09-21 DIAGNOSIS — M542 Cervicalgia: Secondary | ICD-10-CM | POA: Diagnosis not present

## 2018-09-21 DIAGNOSIS — G894 Chronic pain syndrome: Secondary | ICD-10-CM

## 2018-09-21 DIAGNOSIS — M545 Low back pain, unspecified: Secondary | ICD-10-CM

## 2018-09-21 DIAGNOSIS — M19012 Primary osteoarthritis, left shoulder: Secondary | ICD-10-CM

## 2018-09-21 DIAGNOSIS — G8929 Other chronic pain: Secondary | ICD-10-CM

## 2018-09-21 DIAGNOSIS — M7918 Myalgia, other site: Secondary | ICD-10-CM

## 2018-09-21 DIAGNOSIS — M19011 Primary osteoarthritis, right shoulder: Secondary | ICD-10-CM

## 2018-09-21 DIAGNOSIS — M549 Dorsalgia, unspecified: Secondary | ICD-10-CM

## 2018-09-21 DIAGNOSIS — M25511 Pain in right shoulder: Secondary | ICD-10-CM

## 2018-09-21 MED ORDER — OXYCODONE HCL 5 MG PO TABS: 5.0000 mg | ORAL_TABLET | Freq: Two times a day (BID) | ORAL | 0 refills | Status: DC

## 2018-09-21 MED ORDER — GABAPENTIN 300 MG PO CAPS: 300.0000 mg | ORAL_CAPSULE | Freq: Three times a day (TID) | ORAL | 0 refills | Status: DC

## 2018-09-21 MED ORDER — CYCLOBENZAPRINE HCL 5 MG PO TABS: 5.0000 mg | ORAL_TABLET | Freq: Three times a day (TID) | ORAL | 2 refills | Status: DC | PRN

## 2018-09-21 NOTE — Patient Instructions (Signed)
____________________________________________________________________________________________  Medication Rules  Purpose: To inform patients, and their family members, of our rules and regulations.  Applies to: All patients receiving prescriptions (written or electronic).  Pharmacy of record: Pharmacy where electronic prescriptions will be sent. If written prescriptions are taken to a different pharmacy, please inform the nursing staff. The pharmacy listed in the electronic medical record should be the one where you would like electronic prescriptions to be sent.  Electronic prescriptions: In compliance with the  Strengthen Opioid Misuse Prevention (STOP) Act of 2017 (Session Law 2017-74/H243), effective January 21, 2018, all controlled substances must be electronically prescribed. Calling prescriptions to the pharmacy will cease to exist.  Prescription refills: Only during scheduled appointments. Applies to all prescriptions.  NOTE: The following applies primarily to controlled substances (Opioid* Pain Medications).   Patient's responsibilities: 1. Pain Pills: Bring all pain pills to every appointment (except for procedure appointments). 2. Pill Bottles: Bring pills in original pharmacy bottle. Always bring the newest bottle. Bring bottle, even if empty. 3. Medication refills: You are responsible for knowing and keeping track of what medications you take and those you need refilled. The day before your appointment: write a list of all prescriptions that need to be refilled. The day of the appointment: give the list to the admitting nurse. Prescriptions will be written only during appointments. No prescriptions will be written on procedure days. If you forget a medication: it will not be "Called in", "Faxed", or "electronically sent". You will need to get another appointment to get these prescribed. No early refills. Do not call asking to have your prescription filled  early. 4. Prescription Accuracy: You are responsible for carefully inspecting your prescriptions before leaving our office. Have the discharge nurse carefully go over each prescription with you, before taking them home. Make sure that your name is accurately spelled, that your address is correct. Check the name and dose of your medication to make sure it is accurate. Check the number of pills, and the written instructions to make sure they are clear and accurate. Make sure that you are given enough medication to last until your next medication refill appointment. 5. Taking Medication: Take medication as prescribed. When it comes to controlled substances, taking less pills or less frequently than prescribed is permitted and encouraged. Never take more pills than instructed. Never take medication more frequently than prescribed.  6. Inform other Doctors: Always inform, all of your healthcare providers, of all the medications you take. 7. Pain Medication from other Providers: You are not allowed to accept any additional pain medication from any other Doctor or Healthcare provider. There are two exceptions to this rule. (see below) In the event that you require additional pain medication, you are responsible for notifying us, as stated below. 8. Medication Agreement: You are responsible for carefully reading and following our Medication Agreement. This must be signed before receiving any prescriptions from our practice. Safely store a copy of your signed Agreement. Violations to the Agreement will result in no further prescriptions. (Additional copies of our Medication Agreement are available upon request.) 9. Laws, Rules, & Regulations: All patients are expected to follow all Federal and State Laws, Statutes, Rules, & Regulations. Ignorance of the Laws does not constitute a valid excuse. The use of any illegal substances is prohibited. 10. Adopted CDC guidelines & recommendations: Target dosing levels will be  at or below 60 MME/day. Use of benzodiazepines** is not recommended.  Exceptions: There are only two exceptions to the rule of not   receiving pain medications from other Healthcare Providers. 1. Exception #1 (Emergencies): In the event of an emergency (i.e.: accident requiring emergency care), you are allowed to receive additional pain medication. However, you are responsible for: As soon as you are able, call our office [phone], at any time of the day or night, and leave a message stating your name, the date and nature of the emergency, and the name and dose of the medication prescribed. In the event that your call is answered by a member of our staff, make sure to document and save the date, time, and the name of the person that took your information.  2. Exception #2 (Planned Surgery): In the event that you are scheduled by another doctor or dentist to have any type of surgery or procedure, you are allowed (for a period no longer than 30 days), to receive additional pain medication, for the acute post-op pain. However, in this case, you are responsible for picking up a copy of our "Post-op Pain Management for Surgeons" handout, and giving it to your surgeon or dentist. This document is available at our office, and does not require an appointment to obtain it. Simply go to our office during business hours (Monday-Thursday from 8:00 AM to 4:00 PM) (Friday 8:00 AM to 12:00 Noon) or if you have a scheduled appointment with us, prior to your surgery, and ask for it by name. In addition, you will need to provide us with your name, name of your surgeon, type of surgery, and date of procedure or surgery.  *Opioid medications include: morphine, codeine, oxycodone, oxymorphone, hydrocodone, hydromorphone, meperidine, tramadol, tapentadol, buprenorphine, fentanyl, methadone. **Benzodiazepine medications include: diazepam (Valium), alprazolam (Xanax), clonazepam (Klonopine), lorazepam (Ativan), clorazepate  (Tranxene), chlordiazepoxide (Librium), estazolam (Prosom), oxazepam (Serax), temazepam (Restoril), triazolam (Halcion) (Last updated: 03/20/2017) ____________________________________________________________________________________________   ____________________________________________________________________________________________  Medication Recommendations and Reminders  Applies to: All patients receiving prescriptions (written and/or electronic).  Medication Rules & Regulations: These rules and regulations exist for your safety and that of others. They are not flexible and neither are we. Dismissing or ignoring them will be considered "non-compliance" with medication therapy, resulting in complete and irreversible termination of such therapy. (See document titled "Medication Rules" for more details.) In all conscience, because of safety reasons, we cannot continue providing a therapy where the patient does not follow instructions.  Pharmacy of record:   Definition: This is the pharmacy where your electronic prescriptions will be sent.   We do not endorse any particular pharmacy.  You are not restricted in your choice of pharmacy.  The pharmacy listed in the electronic medical record should be the one where you want electronic prescriptions to be sent.  If you choose to change pharmacy, simply notify our nursing staff of your choice of new pharmacy.  Recommendations:  Keep all of your pain medications in a safe place, under lock and key, even if you live alone.   After you fill your prescription, take 1 week's worth of pills and put them away in a safe place. You should keep a separate, properly labeled bottle for this purpose. The remainder should be kept in the original bottle. Use this as your primary supply, until it runs out. Once it's gone, then you know that you have 1 week's worth of medicine, and it is time to come in for a prescription refill. If you do this correctly, it  is unlikely that you will ever run out of medicine.  To make sure that the above recommendation works,   it is very important that you make sure your medication refill appointments are scheduled at least 1 week before you run out of medicine. To do this in an effective manner, make sure that you do not leave the office without scheduling your next medication management appointment. Always ask the nursing staff to show you in your prescription , when your medication will be running out. Then arrange for the receptionist to get you a return appointment, at least 7 days before you run out of medicine. Do not wait until you have 1 or 2 pills left, to come in. This is very poor planning and does not take into consideration that we may need to cancel appointments due to bad weather, sickness, or emergencies affecting our staff.  "Partial Fill": If for any reason your pharmacy does not have enough pills/tablets to completely fill or refill your prescription, do not allow for a "partial fill". You will need a separate prescription to fill the remaining amount, which we will not provide. If the reason for the partial fill is your insurance, you will need to talk to the pharmacist about payment alternatives for the remaining tablets, but again, do not accept a partial fill.  Prescription refills and/or changes in medication(s):   Prescription refills, and/or changes in dose or medication, will be conducted only during scheduled medication management appointments. (Applies to both, written and electronic prescriptions.)  No refills on procedure days. No medication will be changed or started on procedure days. No changes, adjustments, and/or refills will be conducted on a procedure day. Doing so will interfere with the diagnostic portion of the procedure.  No phone refills. No medications will be "called into the pharmacy".  No Fax refills.  No weekend refills.  No Holliday refills.  No after hours  refills.  Remember:  Business hours are:  Monday to Thursday 8:00 AM to 4:00 PM Provider's Schedule: Emmelina Mcloughlin, MD - Appointments are:  Medication management: Monday and Wednesday 8:00 AM to 4:00 PM Procedure day: Tuesday and Thursday 7:30 AM to 4:00 PM Bilal Lateef, MD - Appointments are:  Medication management: Tuesday and Thursday 8:00 AM to 4:00 PM Procedure day: Monday and Wednesday 7:30 AM to 4:00 PM (Last update: 03/20/2017) ____________________________________________________________________________________________   ____________________________________________________________________________________________  CANNABIDIOL (AKA: CBD Oil or Pills)  Applies to: All patients receiving prescriptions of controlled substances (written and/or electronic).  General Information: Cannabidiol (CBD) was discovered in 1940. It is one of some 113 identified cannabinoids in cannabis (Marijuana) plants, accounting for up to 40% of the plant's extract. As of 2018, preliminary clinical research on cannabidiol included studies of anxiety, cognition, movement disorders, and pain.  Cannabidiol is consummed in multiple ways, including inhalation of cannabis smoke or vapor, as an aerosol spray into the cheek, and by mouth. It may be supplied as CBD oil containing CBD as the active ingredient (no added tetrahydrocannabinol (THC) or terpenes), a full-plant CBD-dominant hemp extract oil, capsules, dried cannabis, or as a liquid solution. CBD is thought not have the same psychoactivity as THC, and may affect the actions of THC. Studies suggest that CBD may interact with different biological targets, including cannabinoid receptors and other neurotransmitter receptors. As of 2018 the mechanism of action for its biological effects has not been determined.  In the [place], cannabidiol has a limited approval by the Food and Drug Administration (FDA) for treatment of only two types of epilepsy  disorders. The side effects of long-term use of the drug include somnolence, decreased appetite, diarrhea,   fatigue, malaise, weakness, sleeping problems, and others.  CBD remains a Schedule I drug prohibited for any use.  Legality: Some manufacturers ship CBD products nationally, an illegal action which the FDA has not enforced in 2018, with CBD remaining the subject of an FDA investigational new drug evaluation, and is not considered legal as a dietary supplement or food ingredient as of December 2018. Federal illegality has made it difficult historically to conduct research on CBD. CBD is openly sold in head shops and health food stores in some states where such sales have not been explicitly legalized.  Warning: Because it is not FDA approved for general use or treatment of pain, it is not required to undergo the same manufacturing controls as prescription drugs.  This means that the available cannabidiol (CBD) may be contaminated with THC.  If this is the case, it will trigger a positive urine drug screen (UDS) test for cannabinoids (Marijuana).  Because a positive UDS for illicit substances is a violation of our medication agreement, your opioid analgesics (pain medicine) may be permanently discontinued. (Last update: 04/10/2017) ____________________________________________________________________________________________    

## 2018-09-21 NOTE — Progress Notes (Signed)
Pain Management Virtual Encounter Note - Virtual Visit via Telephone Telehealth (real-time audio visits between healthcare provider and patient).   Patient's Phone No. & Preferred Pharmacy:  [phone] (home); [phone] (mobile); (Preferred) [phone] [email or web address]  [id number] - [place], [place] - [address] [address] Phone: [phone] Fax: [phone]    Pre-screening note:  Our staff contacted Tanya [name] and offered her an "in person", "face-to-face" appointment versus a telephone encounter. She indicated preferring the telephone encounter, at this time.   Reason for Virtual Visit: COVID-19*  Social distancing based on CDC and AMA recommendations.   I contacted Tanya [name] on 09/21/2018 via telephone.      I clearly identified myself as Gaspar Cola, MD. I verified that I was speaking with the correct person using two identifiers (Name: Tanya [name], and date of birth: 08-28-1960).  Advanced Informed Consent I sought verbal advanced consent from Tanya [name] for virtual visit interactions. I informed Tanya [name] of possible security and privacy concerns, risks, and limitations associated with providing "not-in-person" medical evaluation and management services. I also informed Tanya [name] of the availability of "in-person" appointments. Finally, I informed her that there would be a charge for the virtual visit and that she could be  personally, fully or partially, financially responsible for it. Tanya [name] expressed understanding and agreed to proceed.   Historic Elements   Tanya [name] is a 58 y.o. year old, female patient evaluated today after her last encounter by our practice on 06/25/2018. Tanya [name]  has a past medical history of Anxiety, Bladder infection, Chronic lower back pain, Chronic neck pain, Depression, Diverticulitis, Hyperlipidemia, Lupus ([place]), and Overactive bladder. She  also  has a past surgical history that includes Abdominal hysterectomy; Colonoscopy; Colonoscopy with propofol (N/A, 07/03/2017); Tonsillectomy; Esophagogastroduodenoscopy (N/A, 10/21/2017); and Colonoscopy with propofol (N/A, 10/21/2017). Tanya [name] has a current medication list which includes the following prescription(s): cyclobenzaprine, duloxetine, gabapentin, oxycodone, oxycodone, oxycodone, chlorhexidine, hydrocodone-acetaminophen, and pregabalin. She  reports that she has been smoking cigarettes. She has a 17.50 pack-year smoking history. She has never used smokeless tobacco. She reports that she does not drink alcohol or use drugs. Tanya [name] is allergic to trazodone; bupropion; methocarbamol; oxycodone-acetaminophen; tramadol; and trazodone and nefazodone.   HPI  Today, she is being contacted for medication management.  Today the patient was question regarding her gabapentin use and the recent prescription for Lyrica 50 mg written by Rayetta Humphrey, NP and failed on 09/10/2018.  In addition, the patient was questioned regarding a prescription for hydrocodone written by Annamaria Helling, DDS.  She was reminded of our rules and regulations and today we have again added a copy of those to her after visit summary.  The patient has access to "my chart" and she was reminded to look for those and today's after visit summary.  The patient indicates that she arrived be taking the Lyrica and therefore I have given her instructions to start tapering the gabapentin down until she can stop it.  She has also complained about an increase in her right shoulder pain and therefore we will go ahead and bring her in for a right shoulder injection to see if this can help with toning down this pain.  Pharmacotherapy Assessment  Analgesic: Oxycodone IR 5 mg, 1 tab PO BID (10 mg/day of oxycodone) MME/day: 15 mg/day.   Monitoring: Pharmacotherapy: No side-effects or adverse reactions reported. [place] PMP: PDMP reviewed  during this encounter.  Compliance: No problems identified. Effectiveness: Clinically acceptable. Plan: Refer to "POC".  UDS:  Summary  Date Value Ref Range Status  03/25/2018 FINAL  Final    Comment:    ==================================================================== TOXASSURE SELECT 13 (MW) ==================================================================== Specimen Alert Note:  Urinary creatinine is low; ability to detect some drugs may be compromised.  Interpret results with caution. ==================================================================== Test                             Result       Flag       Units Drug Present and Declared for Prescription Verification   Oxycodone                      1447         EXPECTED   ng/mg creat   Oxymorphone                    753          EXPECTED   ng/mg creat   Noroxycodone                   2053         EXPECTED   ng/mg creat    Sources of oxycodone include scheduled prescription medications.    Oxymorphone and noroxycodone are expected metabolites of    oxycodone. Oxymorphone is also available as a scheduled    prescription medication. Drug Absent but Declared for Prescription Verification   Tramadol                       Not Detected UNEXPECTED ng/mg creat ==================================================================== Test                      Result    Flag   Units      Ref Range   Creatinine              17        L      mg/dL      >=20 ==================================================================== Declared Medications:  The flagging and interpretation on this report are based on the  following declared medications.  Unexpected results may arise from  inaccuracies in the declared medications.  **Note: The testing scope of this panel includes these medications:  Oxycodone  Oxycodone (Oxycodone Acetaminophen)  Tramadol  **Note: The testing scope of this panel does not include following  reported  medications:  Acetaminophen (Oxycodone Acetaminophen)  Bupropion  Cyclobenzaprine  Duloxetine  Gabapentin  Hydroxychloroquine  Methocarbamol  Nefazodone  Pantoprazole  Trazodone ==================================================================== For clinical consultation, please call [phone]. ====================================================================    Laboratory Chemistry Profile (12 mo)  Renal: No results found for requested labs within last 8760 hours.  Lab Results  Component Value Date   GFRAA 114 09/03/2017   GFRNONAA 99 09/03/2017   Hepatic: No results found for requested labs within last 8760 hours. Lab Results  Component Value Date   AST 15 09/03/2017   ALT 16 09/08/2016   Other: No results found for requested labs within last 8760 hours. Note: Above Lab results reviewed.  Imaging  Last 90 days:  No results found.  Assessment  The primary encounter diagnosis was Chronic pain syndrome. Diagnoses of Chronic neck pain (Primary Area of Pain) (Bilateral) (R>L), Cervicalgia, Chronic upper back pain (Secondary area of Pain) (Bilateral) (R>L), Chronic low back pain (Third area  of Pain) (Bilateral) (R>L), Fibromyalgia, Chronic musculoskeletal pain, Neurogenic pain, Chronic shoulder pain (Bilateral) (R>L), Chronic shoulder pain (Right), Osteoarthritis of AC (acromioclavicular) joint (Right), and Primary osteoarthritis of both shoulders were also pertinent to this visit.  Plan of Care  I have discontinued Tanya [name]'s doxycycline, predniSONE, albuterol, benzonatate, oxyCODONE, and oxyCODONE. I have also changed her oxyCODONE. Additionally, I am having her start on oxyCODONE and oxyCODONE. Lastly, I am having her maintain her DULoxetine, chlorhexidine, HYDROcodone-acetaminophen, pregabalin, cyclobenzaprine, and gabapentin.  Pharmacotherapy (Medications Ordered): Meds ordered this encounter  Medications  . cyclobenzaprine (FLEXERIL) 5 MG tablet     Sig: Take 1 tablet (5 mg total) by mouth 3 (three) times daily as needed for muscle spasms. Must last 30 days    Dispense:  90 tablet    Refill:  2    Fill one day early if pharmacy is closed on scheduled refill date. May substitute for generic if available.  . gabapentin (NEURONTIN) 300 MG capsule    Sig: Take 1 capsule (300 mg total) by mouth 3 (three) times daily.    Dispense:  90 capsule    Refill:  0    Fill one day early if pharmacy is closed on scheduled refill date. May substitute for generic if available.  Marland Kitchen oxyCODONE (OXY IR/ROXICODONE) 5 MG immediate release tablet    Sig: Take 1 tablet (5 mg total) by mouth 2 (two) times daily. Must last 30 days    Dispense:  60 tablet    Refill:  0    Chronic Pain: STOP Act (Not applicable) Fill 1 day early if closed on refill date. Do not fill until: 09/23/2018. To last until: 10/23/2018. Avoid benzodiazepines within 8 hours of opioids  . oxyCODONE (OXY IR/ROXICODONE) 5 MG immediate release tablet    Sig: Take 1 tablet (5 mg total) by mouth 2 (two) times daily. Must last 30 days    Dispense:  60 tablet    Refill:  0    Chronic Pain: STOP Act (Not applicable) Fill 1 day early if closed on refill date. Do not fill until: 10/23/2018. To last until: 11/22/2018. Avoid benzodiazepines within 8 hours of opioids  . oxyCODONE (OXY IR/ROXICODONE) 5 MG immediate release tablet    Sig: Take 1 tablet (5 mg total) by mouth 2 (two) times daily. Must last 30 days    Dispense:  60 tablet    Refill:  0    Chronic Pain: STOP Act (Not applicable) Fill 1 day early if closed on refill date. Do not fill until: 11/22/2018. To last until: 12/22/2018. Avoid benzodiazepines within 8 hours of opioids   Orders:  Orders Placed This Encounter  Procedures  . SHOULDER INJECTION    Standing Status:   Future    Standing Expiration Date:   10/21/2018    Scheduling Instructions:     Side: Right-sided     Sedation: With Sedation.     Timeframe: As soon as schedule allows    Order  Specific Question:   Where will this procedure be performed?    Answer:   [facility]    Comments:   by Dr. Dossie Arbour   Follow-up plan:   Return in about 3 months (around 12/21/2018) for (VV), E/M, (MM), in addition, Procedure (w/ Sedation): (R) Shoulder inj., (ASAP).      Interventional management options:  Considering:   Diagnostic bilateral IA shoulder joint injection Diagnostic bilateral suprascapular NB Possible bilateral suprascapular nerve RFA Diagnostic bilateral lumbar facet block  Possible bilateral lumbar facetRFA Possible left-sided occipital nerve RFA    Palliative PRN treatment(s):   Palliative right-sided AC joint injection #2  Palliative left-sided medial epicondyle elbow injection #2  Diagnostic left GONB #2  Palliative right-sided CESI(Series #2) Palliative bilateral cervical facet blocks  Palliative right-sided cervical facet RFA#3(Last done on: 10/23/17) Palliative left-sided cervical facet RFA #2 (Last done on: 06/26/2016)    Recent Visits Date Type Provider Dept  06/25/18 Office Visit Milinda Pointer, MD [facility]  Showing recent visits within past 90 days and meeting all other requirements   Today's Visits Date Type Provider Dept  09/21/18 Office Visit Milinda Pointer, MD [facility]  Showing today's visits and meeting all other requirements   Future Appointments No visits were found meeting these conditions.  Showing future appointments within next 90 days and meeting all other requirements   I discussed the assessment and treatment plan with the patient. The patient was provided an opportunity to ask questions and all were answered. The patient agreed with the plan and demonstrated an understanding of the instructions.  Patient advised to call back or seek an in-person evaluation if the symptoms or condition worsens.  Total duration of non-face-to-face encounter: 12 minutes.  Note by: Gaspar Cola,  MD Date: 09/21/2018; Time: 3:37 PM  Note: This dictation was prepared with Dragon dictation. Any transcriptional errors that may result from this process are unintentional.  Disclaimer:  * Given the special circumstances of the COVID-19 pandemic, the federal government has announced that the Office for Civil Rights (OCR) will exercise its enforcement discretion and will not impose penalties on physicians using telehealth in the event of noncompliance with regulatory requirements under the [place] and [place] (HIPAA) in connection with the good faith provision of telehealth during the XX123456 national public health emergency. ([place])

## 2018-10-06 ENCOUNTER — Encounter: Payer: Self-pay | Admitting: Pain Medicine

## 2018-10-06 ENCOUNTER — Ambulatory Visit
Admission: RE | Admit: 2018-10-06 | Discharge: 2018-10-06 | Disposition: A | Discharge status: Home / Self Care | Payer: Medicaid Other | Source: Ambulatory Visit | Attending: Pain Medicine | Admitting: Pain Medicine

## 2018-10-06 ENCOUNTER — Ambulatory Visit: Payer: Medicaid Other | Admitting: Pain Medicine

## 2018-10-06 ENCOUNTER — Other Ambulatory Visit: Payer: Self-pay

## 2018-10-06 VITALS — BP 126/79 | HR 82 | Temp 98.2°F | Resp 16 | Ht 63.0 in | Wt 149.0 lb

## 2018-10-06 DIAGNOSIS — M19011 Primary osteoarthritis, right shoulder: Secondary | ICD-10-CM | POA: Insufficient documentation

## 2018-10-06 DIAGNOSIS — M19012 Primary osteoarthritis, left shoulder: Secondary | ICD-10-CM | POA: Diagnosis present

## 2018-10-06 DIAGNOSIS — M25511 Pain in right shoulder: Secondary | ICD-10-CM | POA: Diagnosis present

## 2018-10-06 DIAGNOSIS — G8929 Other chronic pain: Secondary | ICD-10-CM | POA: Diagnosis present

## 2018-10-06 MED ORDER — LACTATED RINGERS IV SOLN
1000.0000 mL | Freq: Once | INTRAVENOUS | Status: AC
  Administered 2018-10-06: 12:00:00 1000 mL via INTRAVENOUS

## 2018-10-06 MED ORDER — ROPIVACAINE HCL 2 MG/ML IJ SOLN
9.0000 mL | Freq: Once | INTRAMUSCULAR | Status: AC
  Administered 2018-10-06: 9 mL via INTRA_ARTICULAR
  Filled 2018-10-06: qty 10

## 2018-10-06 MED ORDER — MIDAZOLAM HCL 5 MG/5ML IJ SOLN
1.0000 mg | INTRAMUSCULAR | Status: DC | PRN
  Administered 2018-10-06: 2 mg via INTRAVENOUS
  Filled 2018-10-06: qty 5

## 2018-10-06 MED ORDER — IOHEXOL 180 MG/ML  SOLN
10.0000 mL | Freq: Once | INTRAMUSCULAR | Status: AC
  Administered 2018-10-06: 10 mL via EPIDURAL

## 2018-10-06 MED ORDER — LIDOCAINE HCL 2 % IJ SOLN
20.0000 mL | Freq: Once | INTRAMUSCULAR | Status: AC
  Administered 2018-10-06: 400 mg
  Filled 2018-10-06: qty 40

## 2018-10-06 MED ORDER — FENTANYL CITRATE (PF) 100 MCG/2ML IJ SOLN
25.0000 ug | INTRAMUSCULAR | Status: DC | PRN
  Administered 2018-10-06: 50 ug via INTRAVENOUS
  Filled 2018-10-06: qty 2

## 2018-10-06 MED ORDER — METHYLPREDNISOLONE ACETATE 80 MG/ML IJ SUSP
80.0000 mg | Freq: Once | INTRAMUSCULAR | Status: AC
  Administered 2018-10-06: 80 mg via INTRA_ARTICULAR
  Filled 2018-10-06: qty 1

## 2018-10-06 NOTE — Progress Notes (Signed)
Safety precautions to be maintained throughout the outpatient stay will include: orient to surroundings, keep bed in low position, maintain call bell within reach at all times, provide assistance with transfer out of bed and ambulation.  

## 2018-10-06 NOTE — Patient Instructions (Addendum)

## 2018-10-06 NOTE — Progress Notes (Signed)
Patient's Name: Tanya [name]  MRN: [id number]  Referring Provider: Ricardo Jericho*  DOB: 13-Jun-1960  PCP: Ricardo Jericho, NP  DOS: 10/06/2018  Note by: Gaspar Cola, MD  Service setting: Ambulatory outpatient  Specialty: Interventional Pain Management  Patient type: Established  Location: [facility] (AMB) Pain Management Facility  Visit type: Interventional Procedure   Primary Reason for Visit: Interventional Pain Management Treatment. CC: Shoulder Pain (right)  Procedure:          Anesthesia, Analgesia, Anxiolysis:  Type: Diagnostic Glenohumeral and acromioclavicular joint Injection #2  Primary Purpose: Diagnostic Region: Anterolateral Shoulder Area Level:  Shoulder Target Area: Glenohumeral and acromioclavicular joint Approach: Anterolateral approach. Laterality: Right  Type: Moderate (Conscious) Sedation combined with Local Anesthesia Indication(s): Analgesia and Anxiety Route: Intravenous (IV) IV Access: Secured Sedation: Meaningful verbal contact was maintained at all times during the procedure  Local Anesthetic: Lidocaine 1-2%  Position: Supine   Indications: 1. Chronic shoulder pain (Right)   2. Osteoarthritis of AC (acromioclavicular) joint (Right)   3. Primary osteoarthritis of both shoulders    Pain Score: Pre-procedure: 8 /10 Post-procedure: 0-No pain/10   Pre-op Assessment:  Tanya [name] is a 58 y.o. (year old), female patient, seen today for interventional treatment. She  has a past surgical history that includes Abdominal hysterectomy; Colonoscopy; Colonoscopy with propofol (N/A, 07/03/2017); Tonsillectomy; Esophagogastroduodenoscopy (N/A, 10/21/2017); and Colonoscopy with propofol (N/A, 10/21/2017). Tanya [name] has a current medication list which includes the following prescription(s): cyclobenzaprine, duloxetine, gabapentin, oxycodone, oxycodone, oxycodone, chlorhexidine, hydrocodone-acetaminophen, and pregabalin, and the following  Facility-Administered Medications: fentanyl and midazolam. Her primarily concern today is the Shoulder Pain (right)  Initial Vital Signs:  Pulse/HCG Rate: 82ECG Heart Rate: 62 Temp: 98.3 F (36.8 C) Resp: 18 BP: (!) 145/91 SpO2: 96 %  BMI: Estimated body mass index is 26.39 kg/m as calculated from the following:   Height as of this encounter: 5\' 3"  (1.6 m).   Weight as of this encounter: 149 lb (67.6 kg).  Risk Assessment: Allergies: Reviewed. She is allergic to trazodone; bupropion; methocarbamol; and tramadol.  Allergy Precautions: None required Coagulopathies: Reviewed. None identified.  Blood-thinner therapy: None at this time Active Infection(s): Reviewed. None identified. Tanya [name] is afebrile  Site Confirmation: Tanya [name] was asked to confirm the procedure and laterality before marking the site Procedure checklist: Completed Consent: Before the procedure and under the influence of no sedative(s), amnesic(s), or anxiolytics, the patient was informed of the treatment options, risks and possible complications. To fulfill our ethical and legal obligations, as recommended by the American Medical Association's Code of Ethics, I have informed the patient of my clinical impression; the nature and purpose of the treatment or procedure; the risks, benefits, and possible complications of the intervention; the alternatives, including doing nothing; the risk(s) and benefit(s) of the alternative treatment(s) or procedure(s); and the risk(s) and benefit(s) of doing nothing. The patient was provided information about the general risks and possible complications associated with the procedure. These may include, but are not limited to: failure to achieve desired goals, infection, bleeding, organ or nerve damage, allergic reactions, paralysis, and death. In addition, the patient was informed of those risks and complications associated to the procedure, such as failure to decrease pain;  infection; bleeding; organ or nerve damage with subsequent damage to sensory, motor, and/or autonomic systems, resulting in permanent pain, numbness, and/or weakness of one or several areas of the body; allergic reactions; (i.e.: anaphylactic reaction); and/or death. Furthermore, the patient was informed of those risks and  complications associated with the medications. These include, but are not limited to: allergic reactions (i.e.: anaphylactic or anaphylactoid reaction(s)); adrenal axis suppression; blood sugar elevation that in diabetics may result in ketoacidosis or comma; water retention that in patients with history of congestive heart failure may result in shortness of breath, pulmonary edema, and decompensation with resultant heart failure; weight gain; swelling or edema; medication-induced neural toxicity; particulate matter embolism and blood vessel occlusion with resultant organ, and/or nervous system infarction; and/or aseptic necrosis of one or more joints. Finally, the patient was informed that Medicine is not an exact science; therefore, there is also the possibility of unforeseen or unpredictable risks and/or possible complications that may result in a catastrophic outcome. The patient indicated having understood very clearly. We have given the patient no guarantees and we have made no promises. Enough time was given to the patient to ask questions, all of which were answered to the patient's satisfaction. Tanya [name] has indicated that she wanted to continue with the procedure. Attestation: I, the ordering provider, attest that I have discussed with the patient the benefits, risks, side-effects, alternatives, likelihood of achieving goals, and potential problems during recovery for the procedure that I have provided informed consent. Date  Time: 10/06/2018 10:37 AM  Pre-Procedure Preparation:  Monitoring: As per clinic protocol. Respiration, ETCO2, SpO2, BP, heart rate and rhythm monitor  placed and checked for adequate function Safety Precautions: Patient was assessed for positional comfort and pressure points before starting the procedure. Time-out: I initiated and conducted the "Time-out" before starting the procedure, as per protocol. The patient was asked to participate by confirming the accuracy of the "Time Out" information. Verification of the correct person, site, and procedure were performed and confirmed by me, the nursing staff, and the patient. "Time-out" conducted as per Joint Commission's Universal Protocol (UP.01.01.01). Time: 1139  Description of Procedure:          Area Prepped: Entire shoulder Area Prepping solution: DuraPrep (Iodine Povacrylex [0.7% available iodine] and Isopropyl Alcohol, 74% w/w) Safety Precautions: Aspiration looking for blood return was conducted prior to all injections. At no point did we inject any substances, as a needle was being advanced. No attempts were made at seeking any paresthesias. Safe injection practices and needle disposal techniques used. Medications properly checked for expiration dates. SDV (single dose vial) medications used. Description of the Procedure: Protocol guidelines were followed. The patient was placed in position over the procedure table. The target area was identified and the area prepped in the usual manner. Skin & deeper tissues infiltrated with local anesthetic. Appropriate amount of time allowed to pass for local anesthetics to take effect. The procedure needles were then advanced to the target area. Proper needle placement secured. Negative aspiration confirmed. Solution injected in intermittent fashion, asking for systemic symptoms every 0.5cc of injectate. The needles were then removed and the area cleansed, making sure to leave some of the prepping solution back to take advantage of its long term bactericidal properties.    Vitals:   10/06/18 1146 10/06/18 1151 10/06/18 1201 10/06/18 1211  BP: 117/75 131/83  133/69 126/79  Pulse:      Resp: 14 14 16 16   Temp:  98.2 F (36.8 C)    TempSrc:      SpO2: 94% 94% 97% 97%  Weight:      Height:        Start Time: 1139 hrs. End Time: 1145 hrs. Materials:  Needle(s) Type: Spinal Needle Gauge: 22G Length: 3.5-in Medication(s): Please  see orders for medications and dosing details.  Imaging Guidance (Non-Spinal):          Type of Imaging Technique: Fluoroscopy Guidance (Non-Spinal) Indication(s): Assistance in needle guidance and placement for procedures requiring needle placement in or near specific anatomical locations not easily accessible without such assistance. Exposure Time: Please see nurses notes. Contrast: None used. Fluoroscopic Guidance: I was personally present during the use of fluoroscopy. "Tunnel Vision Technique" used to obtain the best possible view of the target area. Parallax error corrected before commencing the procedure. "Direction-depth-direction" technique used to introduce the needle under continuous pulsed fluoroscopy. Once target was reached, antero-posterior, oblique, and lateral fluoroscopic projection used confirm needle placement in all planes. Images permanently stored in EMR. Interpretation: No contrast injected. I personally interpreted the imaging intraoperatively. Adequate needle placement confirmed in multiple planes. Permanent images saved into the patient's record.  Antibiotic Prophylaxis:   Anti-infectives (From admission, onward)   None     Indication(s): None identified  Post-operative Assessment:  Post-procedure Vital Signs:  Pulse/HCG Rate: 8270 Temp: 98.2 F (36.8 C) Resp: 16 BP: 126/79 SpO2: 97 %  EBL: None  Complications: No immediate post-treatment complications observed by team, or reported by patient.  Note: The patient tolerated the entire procedure well. A repeat set of vitals were taken after the procedure and the patient was kept under observation following institutional policy, for  this type of procedure. Post-procedural neurological assessment was performed, showing return to baseline, prior to discharge. The patient was provided with post-procedure discharge instructions, including a section on how to identify potential problems. Should any problems arise concerning this procedure, the patient was given instructions to immediately contact us, at any time, without hesitation. In any case, we plan to contact the patient by telephone for a follow-up status report regarding this interventional procedure.  Comments:  No additional relevant information.  Plan of Care  Orders:  Orders Placed This Encounter  Procedures  . SHOULDER INJECTION    Scheduling Instructions:     Side: Right-sided     Sedation: With Sedation.     Timeframe: Today    Order Specific Question:   Where will this procedure be performed?    Answer:   [facility]    Comments:   by Dr. Dossie Arbour  . [facility] C-ARM 1-60 MIN NO REPORT    Intraoperative interpretation by procedural physician at [place].    Standing Status:   Standing    Number of Occurrences:   1    Order Specific Question:   Reason for exam:    Answer:   Assistance in needle guidance and placement for procedures requiring needle placement in or near specific anatomical locations not easily accessible without such assistance.  . Provider attestation of informed consent for procedure/surgical case    I, the ordering provider, attest that I have discussed with the patient the benefits, risks, side effects, alternatives, likelihood of achieving goals and potential problems during recovery for the procedure that I have provided informed consent.    Standing Status:   Standing    Number of Occurrences:   1  . Informed Consent Details: Transcribe to consent form and obtain patient signature    Standing Status:   Standing    Number of Occurrences:   1    Order Specific Question:   Procedure    Answer:   Right  Intra-articular shoulder joint injection under fluoroscopic guidance    Order Specific Question:   Surgeon  Answer:   Ulla Mckiernan A. Dossie Arbour, MD    Order Specific Question:   Indication/Reason    Answer:   Shoulder pain secondary to shoulder joint DJD   Chronic Opioid Analgesic:  Oxycodone IR 5 mg, 1 tab PO BID (10 mg/day of oxycodone) MME/day: 15 mg/day.   Medications ordered for procedure: Meds ordered this encounter  Medications  . iohexol (OMNIPAQUE) 180 MG/ML injection 10 mL    Must be Myelogram-compatible. If not available, you may substitute with a water-soluble, non-ionic, hypoallergenic, myelogram-compatible radiological contrast medium.  Marland Kitchen lidocaine (XYLOCAINE) 2 % (with pres) injection 400 mg  . lactated ringers infusion 1,000 mL  . midazolam (VERSED) 5 MG/5ML injection 1-2 mg    Make sure Flumazenil is available in the pyxis when using this medication. If oversedation occurs, administer 0.2 mg IV over 15 sec. If after 45 sec no response, administer 0.2 mg again over 1 min; may repeat at 1 min intervals; not to exceed 4 doses (1 mg)  . fentaNYL (SUBLIMAZE) injection 25-50 mcg    Make sure Narcan is available in the pyxis when using this medication. In the event of respiratory depression (RR< 8/min): Titrate NARCAN (naloxone) in increments of 0.1 to 0.2 mg IV at 2-3 minute intervals, until desired degree of reversal.  . ropivacaine (PF) 2 mg/mL (0.2%) (NAROPIN) injection 9 mL  . methylPREDNISolone acetate (DEPO-MEDROL) injection 80 mg   Medications administered: We administered iohexol, lidocaine, lactated ringers, midazolam, fentaNYL, ropivacaine (PF) 2 mg/mL (0.2%), and methylPREDNISolone acetate.  See the medical record for exact dosing, route, and time of administration.  Follow-up plan:   Return in about 2 weeks (around 10/20/2018) for (VV), E/M, (PP).       Interventional management options:  Considering:   Diagnostic bilateral IA shoulder joint injection Diagnostic  bilateral suprascapular NB Possible bilateral suprascapular nerve RFA Diagnostic bilateral lumbar facet block Possible bilateral lumbar facetRFA Possible left-sided occipital nerve RFA    Palliative PRN treatment(s):   Palliative right-sided glenohumeral joint injection #2  Palliative right-sided AC joint injection #3  Palliative left-sided medial epicondyle elbow injection #2  Diagnostic left GONB #2  Palliative right-sided CESI(Series #2) Palliative bilateral cervical facet blocks  Palliative right-sided cervical facet RFA#3(Last done on: 10/23/17) Palliative left-sided cervical facet RFA #2 (Last done on: 06/26/2016)     Recent Visits Date Type Provider Dept  09/21/18 Office Visit Milinda Pointer, MD [facility]  Showing recent visits within past 90 days and meeting all other requirements   Today's Visits Date Type Provider Dept  10/06/18 Procedure visit Milinda Pointer, MD [facility]  Showing today's visits and meeting all other requirements   Future Appointments Date Type Provider Dept  10/26/18 Appointment Milinda Pointer, MD [facility]  12/21/18 Appointment Milinda Pointer, MD [facility]  Showing future appointments within next 90 days and meeting all other requirements   Disposition: Discharge home  Discharge Date & Time: 10/06/2018; 1214 hrs.   Primary Care Physician: Ricardo Jericho, NP Location: [facility] Note by: Gaspar Cola, MD Date: 10/06/2018; Time: 1:21 PM  Disclaimer:  Medicine is not an Chief Strategy Officer. The only guarantee in medicine is that nothing is guaranteed. It is important to note that the decision to proceed with this intervention was based on the information collected from the patient. The Data and conclusions were drawn from the patient's questionnaire, the interview, and the physical examination. Because the information was provided in large  part by the  patient, it cannot be guaranteed that it has not been purposely or unconsciously manipulated. Every effort has been made to obtain as much relevant data as possible for this evaluation. It is important to note that the conclusions that lead to this procedure are derived in large part from the available data. Always take into account that the treatment will also be dependent on availability of resources and existing treatment guidelines, considered by other Pain Management Practitioners as being common knowledge and practice, at the time of the intervention. For Medico-Legal purposes, it is also important to point out that variation in procedural techniques and pharmacological choices are the acceptable norm. The indications, contraindications, technique, and results of the above procedure should only be interpreted and judged by a Board-Certified Interventional Pain Specialist with extensive familiarity and expertise in the same exact procedure and technique.

## 2018-10-07 ENCOUNTER — Telehealth: Payer: Self-pay

## 2018-10-07 NOTE — Telephone Encounter (Signed)
Post procedure phone call.  Patient states that she is doing well.

## 2018-10-22 ENCOUNTER — Encounter: Payer: Self-pay | Admitting: Pain Medicine

## 2018-10-25 DIAGNOSIS — M19011 Primary osteoarthritis, right shoulder: Secondary | ICD-10-CM | POA: Insufficient documentation

## 2018-10-25 NOTE — Progress Notes (Signed)
Pain Management Virtual Encounter Note - Virtual Visit via Telephone Telehealth (real-time audio visits between healthcare provider and patient).   Patient's Phone No. & Preferred Pharmacy:  [phone] (home); [phone] (mobile); (Preferred) [phone] [email or web address]  [id number] - [place], [place] - [address] [place] [address] Phone: [phone] Fax: [phone]    Pre-screening note:  Our staff contacted Tanya [name] and offered Tanya [name] an "in person", "face-to-face" appointment versus a telephone encounter. She indicated preferring the telephone encounter, at this time.   Reason for Virtual Visit: COVID-19*  Social distancing based on CDC and AMA recommendations.   I contacted Tanya [name] on 10/26/2018 via telephone.      I clearly identified myself as Gaspar Cola, MD. I verified that I was speaking with the correct person using two identifiers (Name: Tanya [name], and date of birth: 07-18-1960).  Advanced Informed Consent I sought verbal advanced consent from Tanya [name] for virtual visit interactions. I informed Tanya [name] of possible security and privacy concerns, risks, and limitations associated with providing "not-in-person" medical evaluation and management services. I also informed Tanya [name] of the availability of "in-person" appointments. Finally, I informed Tanya [name] that there would be a charge for the virtual visit and that she could be  personally, fully or partially, financially responsible for it. Tanya [name] expressed understanding and agreed to proceed.   Historic Elements   Tanya [name] is a 58 y.o. year old, female patient evaluated today after Tanya [name] last encounter by our practice on 10/07/2018. Tanya [name]  has a past medical history of Anxiety, Bladder infection, Chronic lower back pain, Chronic neck pain, Depression, Diverticulitis, Hyperlipidemia, Lupus ([place]), and Overactive bladder. She  also  has a past surgical history that includes Abdominal hysterectomy; Colonoscopy; Colonoscopy with propofol (N/A, 07/03/2017); Tonsillectomy; Esophagogastroduodenoscopy (N/A, 10/21/2017); and Colonoscopy with propofol (N/A, 10/21/2017). Tanya [name] has a current medication list which includes the following prescription(s): cyclobenzaprine, cyclobenzaprine, duloxetine, gabapentin, mirtazapine, oxycodone, oxycodone, oxycodone, oxycodone, and pregabalin. She  reports that she has been smoking cigarettes. She has a 17.50 pack-year smoking history. She has never used smokeless tobacco. She reports that she does not drink alcohol or use drugs. Tanya [name] is allergic to trazodone; bupropion; methocarbamol; and tramadol.   HPI  Today, she is being contacted for both, medication management and a post-procedure assessment. Recently given Lyrica 50 mg PO bid by Dema Severin, NP. Unfortunately, it made Tanya [name] agitated and due to this she stopped it. She was taking gabapentin 300 mg PO TID from me and she indicates that this dose is perfect for Tanya [name] and she does not need any increases.  She was recently put on Remeron and she indicates that this has helped significantly.  Post-Procedure Evaluation  Procedure: Diagnostic right glenohumeral and acromioclavicular joint Injection #2 under fluoroscopic guidance and IV sedation Pre-procedure pain level:  8/10 Post-procedure: 0/10 (100% relief)  Sedation: Please see nurses note.  Effectiveness during initial hour after procedure(Ultra-Short Term Relief): 100 %   Local anesthetic used: Long-acting (4-6 hours) Effectiveness: Defined as any analgesic benefit obtained secondary to the administration of local anesthetics. This carries significant diagnostic value as to the etiological location, or anatomical origin, of the pain. Duration of benefit is expected to coincide with the duration of the local anesthetic used.  Effectiveness during initial 4-6 hours after  procedure(Short-Term Relief): 100 %   Long-term benefit: Defined as any relief past the pharmacologic duration of the local anesthetics.  Effectiveness past the  initial 6 hours after procedure(Long-Term Relief): 50 %   Current benefits: Defined as benefit that persist at this time.   Analgesia:  >50% relief Function: Tanya [name] reports improvement in function ROM: Tanya [name] reports improvement in ROM  Pharmacotherapy Assessment  Analgesic: Oxycodone IR 5 mg, 1 tab PO BID (10 mg/day of oxycodone) MME/day: 15 mg/day.   Monitoring: Pharmacotherapy: No side-effects or adverse reactions reported. [place] PMP: PDMP reviewed during this encounter.       Compliance: No problems identified. Effectiveness: Clinically acceptable. Plan: Refer to "POC".  UDS:  Summary  Date Value Ref Range Status  03/25/2018 FINAL  Final    Comment:    ==================================================================== TOXASSURE SELECT 13 (MW) ==================================================================== Specimen Alert Note:  Urinary creatinine is low; ability to detect some drugs may be compromised.  Interpret results with caution. ==================================================================== Test                             Result       Flag       Units Drug Present and Declared for Prescription Verification   Oxycodone                      1447         EXPECTED   ng/mg creat   Oxymorphone                    753          EXPECTED   ng/mg creat   Noroxycodone                   2053         EXPECTED   ng/mg creat    Sources of oxycodone include scheduled prescription medications.    Oxymorphone and noroxycodone are expected metabolites of    oxycodone. Oxymorphone is also available as a scheduled    prescription medication. Drug Absent but Declared for Prescription Verification   Tramadol                       Not Detected UNEXPECTED ng/mg  creat ==================================================================== Test                      Result    Flag   Units      Ref Range   Creatinine              17        L      mg/dL      >=20 ==================================================================== Declared Medications:  The flagging and interpretation on this report are based on the  following declared medications.  Unexpected results may arise from  inaccuracies in the declared medications.  **Note: The testing scope of this panel includes these medications:  Oxycodone  Oxycodone (Oxycodone Acetaminophen)  Tramadol  **Note: The testing scope of this panel does not include following  reported medications:  Acetaminophen (Oxycodone Acetaminophen)  Bupropion  Cyclobenzaprine  Duloxetine  Gabapentin  Hydroxychloroquine  Methocarbamol  Nefazodone  Pantoprazole  Trazodone ==================================================================== For clinical consultation, please call [phone]. ====================================================================    Laboratory Chemistry Profile (12 mo)  Renal: No results found for requested labs within last 8760 hours.  Lab Results  Component Value Date   GFRAA 114 09/03/2017   GFRNONAA 99 09/03/2017   Hepatic: No results found for requested labs within last 8760 hours.  Lab Results  Component Value Date   AST 15 09/03/2017   ALT 16 09/08/2016   Other: No results found for requested labs within last 8760 hours. Note: Above Lab results reviewed.  Imaging  Last 90 days:  [facility]g [facility] C-arm 1-60 Min No Report  Result Date: 10/06/2018 Fluoro was used, but no Radiologist interpretation will be provided. Please refer to "NOTES" tab for provider progress note.   Assessment  The primary encounter diagnosis was Chronic pain syndrome. 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  Plan of Care  I have discontinued Olivet L. [name]'s chlorhexidine and HYDROcodone-acetaminophen. I am also having Tanya [name] start on cyclobenzaprine and oxyCODONE. Additionally, I am having Tanya [name] maintain Tanya [name] DULoxetine, pregabalin, cyclobenzaprine, oxyCODONE, oxyCODONE, mirtazapine, gabapentin, and oxyCODONE.  Pharmacotherapy (Medications Ordered): Meds ordered this encounter  Medications  . gabapentin (NEURONTIN) 300 MG capsule    Sig: Take 1 capsule (300 mg total) by mouth 3 (three) times daily.    Dispense:  90 capsule    Refill:  5    Fill one day early if pharmacy is closed on scheduled refill date. May substitute for generic if available.  . cyclobenzaprine (FLEXERIL) 5 MG tablet    Sig: Take 1 tablet (5 mg total) by mouth 3 (three) times daily. Must last 30 days    Dispense:  90 tablet    Refill:  3    Fill one day early if pharmacy is closed on scheduled refill date. May substitute for generic if available.  Marland Kitchen oxyCODONE (OXY IR/ROXICODONE) 5 MG immediate release tablet    Sig: Take 1 tablet (5 mg total) by mouth 2 (two) times daily. Must last 30 days    Dispense:  60 tablet    Refill:  0    Chronic Pain: STOP Act (Not applicable) Fill 1 day early if closed on refill date. Do not fill until: 12/22/2018. To last until: 01/21/2019. Avoid benzodiazepines within 8 hours of opioids  . oxyCODONE (OXY IR/ROXICODONE) 5 MG immediate release tablet    Sig: Take 1 tablet (5 mg total) by mouth 2 (two) times daily. Must last 30 days    Dispense:  60 tablet    Refill:  0    Chronic Pain: STOP Act (Not applicable)  Fill 1 day early if closed on refill date. Do not fill until: 01/21/2019. To last until: 02/20/2019. Avoid benzodiazepines within 8 hours of opioids   Orders:  Orders Placed This Encounter  Procedures  . Comp. Metabolic Panel (12)    With GFR. Indications: Chronic Pain Syndrome (G89.4) & Pharmacotherapy [id number])    Order Specific Question:   Has the patient fasted?    Answer:   No    Order Specific Question:   CC Results    Answer:   PCP-NURSE [id number]   Follow-up plan:   Return in about 4 months (around 02/17/2019) for (VV), (MM) to check on Tanya [name] lab work..      Interventional management options:  Considering:   Diagnostic right suprascapular NB Possible right suprascapular nerve RFA Diagnostic bilateral lumbar facet block Possible bilateral lumbar facetRFA Possible left-sided occipital nerve RFA  Palliative PRN treatment(s):   Palliative right glenohumeral joint injection #3  Palliative right AC joint injection #4  Palliative left medial epicondyle elbow injection #2  Diagnostic left GONB #2  Palliative right CESI(Series #2) Palliative bilateral cervical facet block  Palliative right cervical facet RFA#3(Last done on: 10/23/17) Palliative left cervical facet RFA #2 (Last done on: 06/26/2016)    Recent Visits Date Type Provider Dept  10/06/18 Procedure visit Milinda Pointer, MD [facility]  09/21/18 Office Visit Milinda Pointer, MD [facility]  Showing recent visits within past 90 days and meeting all other requirements   Today's Visits Date Type Provider Dept  10/26/18 Office Visit Milinda Pointer, MD [facility]  Showing today's visits and meeting all other requirements   Future Appointments Date Type Provider Dept  12/21/18 Appointment Milinda Pointer, MD [facility]  Showing future appointments within next 90 days and meeting all other requirements   I discussed the assessment and treatment plan with  the patient. The patient was provided an opportunity to ask questions and all were answered. The patient agreed with the plan and demonstrated an understanding of the instructions.  Patient advised to call back or seek an in-person evaluation if the symptoms or condition worsens.  Total duration of non-face-to-face encounter: 15 minutes.  Note by: Gaspar Cola, MD Date: 10/26/2018; Time: 2:52 PM  Note: This dictation was prepared with Dragon dictation. Any transcriptional errors that may result from this process are unintentional.  Disclaimer:  * Given the special circumstances of the COVID-19 pandemic, the federal government has announced that the Office for Civil Rights (OCR) will exercise its enforcement discretion and will not impose penalties on physicians using telehealth in the event of noncompliance with regulatory requirements under the [place] (HIPAA) in connection with the good faith provision of telehealth during the XX123456 national public health emergency. ([place])

## 2018-10-26 ENCOUNTER — Ambulatory Visit: Payer: Medicaid Other | Attending: Pain Medicine | Admitting: Pain Medicine

## 2018-10-26 ENCOUNTER — Other Ambulatory Visit: Payer: Self-pay

## 2018-10-26 DIAGNOSIS — M25511 Pain in right shoulder: Secondary | ICD-10-CM

## 2018-10-26 DIAGNOSIS — M545 Low back pain, unspecified: Secondary | ICD-10-CM

## 2018-10-26 DIAGNOSIS — Z79899 Other long term (current) drug therapy: Secondary | ICD-10-CM

## 2018-10-26 DIAGNOSIS — M19011 Primary osteoarthritis, right shoulder: Secondary | ICD-10-CM | POA: Diagnosis not present

## 2018-10-26 DIAGNOSIS — M549 Dorsalgia, unspecified: Secondary | ICD-10-CM

## 2018-10-26 DIAGNOSIS — M19012 Primary osteoarthritis, left shoulder: Secondary | ICD-10-CM

## 2018-10-26 DIAGNOSIS — G894 Chronic pain syndrome: Secondary | ICD-10-CM

## 2018-10-26 DIAGNOSIS — G8929 Other chronic pain: Secondary | ICD-10-CM

## 2018-10-26 DIAGNOSIS — M797 Fibromyalgia: Secondary | ICD-10-CM

## 2018-10-26 DIAGNOSIS — M792 Neuralgia and neuritis, unspecified: Secondary | ICD-10-CM

## 2018-10-26 DIAGNOSIS — M542 Cervicalgia: Secondary | ICD-10-CM

## 2018-10-26 DIAGNOSIS — M7918 Myalgia, other site: Secondary | ICD-10-CM

## 2018-10-26 MED ORDER — OXYCODONE HCL 5 MG PO TABS: 5.0000 mg | ORAL_TABLET | Freq: Two times a day (BID) | ORAL | 0 refills | Status: DC

## 2018-10-26 MED ORDER — GABAPENTIN 300 MG PO CAPS: 300.0000 mg | ORAL_CAPSULE | Freq: Three times a day (TID) | ORAL | 5 refills | Status: DC

## 2018-10-26 MED ORDER — CYCLOBENZAPRINE HCL 5 MG PO TABS: 5.0000 mg | ORAL_TABLET | Freq: Three times a day (TID) | ORAL | 3 refills | Status: DC

## 2018-11-25 ENCOUNTER — Other Ambulatory Visit: Payer: Self-pay | Admitting: Pain Medicine

## 2018-11-25 DIAGNOSIS — G894 Chronic pain syndrome: Secondary | ICD-10-CM

## 2018-12-16 ENCOUNTER — Encounter: Payer: Self-pay | Admitting: Pain Medicine

## 2018-12-21 ENCOUNTER — Encounter: Payer: Self-pay | Admitting: Pain Medicine

## 2018-12-21 ENCOUNTER — Telehealth: Payer: Self-pay

## 2018-12-21 ENCOUNTER — Ambulatory Visit: Payer: Medicaid Other | Attending: Pain Medicine | Admitting: Pain Medicine

## 2018-12-21 ENCOUNTER — Other Ambulatory Visit: Payer: Self-pay

## 2018-12-21 ENCOUNTER — Ambulatory Visit: Payer: Medicaid Other | Admitting: Pain Medicine

## 2018-12-21 DIAGNOSIS — M797 Fibromyalgia: Secondary | ICD-10-CM | POA: Diagnosis not present

## 2018-12-21 DIAGNOSIS — M7918 Myalgia, other site: Secondary | ICD-10-CM | POA: Diagnosis not present

## 2018-12-21 DIAGNOSIS — G8929 Other chronic pain: Secondary | ICD-10-CM | POA: Diagnosis not present

## 2018-12-21 DIAGNOSIS — G894 Chronic pain syndrome: Secondary | ICD-10-CM

## 2018-12-21 MED ORDER — OXYCODONE HCL 5 MG PO TABS: 5.0000 mg | ORAL_TABLET | Freq: Two times a day (BID) | ORAL | 0 refills | Status: DC

## 2018-12-21 NOTE — Telephone Encounter (Signed)
Called patient, no answer. Left message for patient that Dr Consuela Mimes will call her today instead of tomorrow that I had mentioned on previous phone call.

## 2018-12-21 NOTE — Progress Notes (Signed)
Pain Management Virtual Encounter Note - Virtual Visit via Telephone Telehealth (real-time audio visits between healthcare provider and patient).   Patient's Phone No. & Preferred Pharmacy:  [phone] (home); [phone] (mobile); (Preferred) [phone] [email or web address]  [id number] - [place], [place] - [address] [place] [address] Phone: [phone] Fax: [phone]    Pre-screening note:  Our staff contacted Tanya [name] and offered her an "in person", "face-to-face" appointment versus a telephone encounter. She indicated preferring the telephone encounter, at this time.   Reason for Virtual Visit: COVID-19*  Social distancing based on CDC and AMA recommendations.   I contacted Tanya [name] on 12/21/2018 via telephone.      I clearly identified myself as Gaspar Cola, MD. I verified that I was speaking with the correct person using two identifiers (Name: Tanya [name], and date of birth: Mar 26, 1960).  Advanced Informed Consent I sought verbal advanced consent from Tanya [name] for virtual visit interactions. I informed Tanya [name] of possible security and privacy concerns, risks, and limitations associated with providing "not-in-person" medical evaluation and management services. I also informed Tanya [name] of the availability of "in-person" appointments. Finally, I informed her that there would be a charge for the virtual visit and that she could be  personally, fully or partially, financially responsible for it. Tanya [name] expressed understanding and agreed to proceed.   Historic Elements   Tanya [name] is a 58 y.o. year old, female patient evaluated today after her last encounter by our practice on 11/25/2018. Tanya [name]  has a past medical history of Anxiety, Bladder infection, Chronic lower back pain, Chronic neck pain, Depression, Diverticulitis, Hyperlipidemia, Lupus ([place]), and Overactive bladder.  She also  has a past surgical history that includes Abdominal hysterectomy; Colonoscopy; Colonoscopy with propofol (N/A, 07/03/2017); Tonsillectomy; Esophagogastroduodenoscopy (N/A, 10/21/2017); and Colonoscopy with propofol (N/A, 10/21/2017). Tanya [name] has a current medication list which includes the following prescription(s): atorvastatin, cyclobenzaprine, duloxetine, gabapentin, mirtazapine, oxycodone, oxycodone, oxycodone, pantoprazole, and pregabalin. She  reports that she has been smoking cigarettes. She has a 17.50 pack-year smoking history. She has never used smokeless tobacco. She reports that she does not drink alcohol or use drugs. Tanya [name] is allergic to pregabalin; trazodone; bupropion; methocarbamol; and tramadol.   HPI  Today, she is being contacted for medication management.   Pharmacotherapy Assessment  Analgesic: Oxycodone IR 5 mg, 1 tab PO BID (10 mg/day of oxycodone) MME/day: 15 mg/day.   Monitoring: Pharmacotherapy: No side-effects or adverse reactions reported. [place] PMP: PDMP reviewed during this encounter.       Compliance: No problems identified. Effectiveness: Clinically acceptable. Plan: Refer to "POC".  UDS:  Summary  Date Value Ref Range Status  03/25/2018 FINAL  Final    Comment:    ==================================================================== TOXASSURE SELECT 13 (MW) ==================================================================== Specimen Alert Note:  Urinary creatinine is low; ability to detect some drugs may be compromised.  Interpret results with caution. ==================================================================== Test                             Result       Flag       Units Drug Present and Declared for Prescription Verification   Oxycodone                      1447         EXPECTED   ng/mg creat   Oxymorphone  753          EXPECTED   ng/mg creat   Noroxycodone                   2053         EXPECTED    ng/mg creat    Sources of oxycodone include scheduled prescription medications.    Oxymorphone and noroxycodone are expected metabolites of    oxycodone. Oxymorphone is also available as a scheduled    prescription medication. Drug Absent but Declared for Prescription Verification   Tramadol                       Not Detected UNEXPECTED ng/mg creat ==================================================================== Test                      Result    Flag   Units      Ref Range   Creatinine              17        L      mg/dL      >=20 ==================================================================== Declared Medications:  The flagging and interpretation on this report are based on the  following declared medications.  Unexpected results may arise from  inaccuracies in the declared medications.  **Note: The testing scope of this panel includes these medications:  Oxycodone  Oxycodone (Oxycodone Acetaminophen)  Tramadol  **Note: The testing scope of this panel does not include following  reported medications:  Acetaminophen (Oxycodone Acetaminophen)  Bupropion  Cyclobenzaprine  Duloxetine  Gabapentin  Hydroxychloroquine  Methocarbamol  Nefazodone  Pantoprazole  Trazodone ==================================================================== For clinical consultation, please call [phone]. ====================================================================    Laboratory Chemistry Profile (12 mo)  Renal: No results found for requested labs within last 8760 hours.  Lab Results  Component Value Date   GFRAA 114 09/03/2017   GFRNONAA 99 09/03/2017   Hepatic: No results found for requested labs within last 8760 hours. Lab Results  Component Value Date   AST 15 09/03/2017   ALT 16 09/08/2016   Other: No results found for requested labs within last 8760 hours. Note: Above Lab results reviewed.  Imaging  DG PAIN CLINIC C-ARM 1-60 MIN NO REPORT Fluoro was used, but  no Radiologist interpretation will be provided.  Please refer to "NOTES" tab for provider progress note.   Assessment  Diagnoses of Fibromyalgia, Chronic musculoskeletal pain, and Chronic pain syndrome were pertinent to this visit.  Plan of Care  Problem-specific:  No problem-specific Assessment & Plan notes found for this encounter.  I am having Tanya [name] maintain her DULoxetine, pregabalin, mirtazapine, gabapentin, cyclobenzaprine, oxyCODONE, oxyCODONE, atorvastatin, pantoprazole, and oxyCODONE.  Pharmacotherapy (Medications Ordered): Meds ordered this encounter  Medications  . oxyCODONE (OXY IR/ROXICODONE) 5 MG immediate release tablet    Sig: Take 1 tablet (5 mg total) by mouth 2 (two) times daily. Must last 30 days    Dispense:  60 tablet    Refill:  0    Chronic Pain: STOP Act (Not applicable) Fill 1 day early if closed on refill date. Do not fill until: 02/20/2019. To last until: 03/22/2019. Avoid benzodiazepines within 8 hours of opioids   Orders:  No orders of the defined types were placed in this encounter.  Follow-up plan:   Return in about 13 weeks (around 03/22/2019) for (VV), (MM).      Interventional management options:  Considering:   Diagnostic right suprascapular NB  Possible right suprascapular nerve RFA Diagnostic bilateral lumbar facet block Possible bilateral lumbar facetRFA Possible left-sided occipital nerve RFA    Palliative PRN treatment(s):   Palliative right glenohumeral joint injection #3  Palliative right AC joint injection #4  Palliative left medial epicondyle elbow injection #2  Diagnostic left GONB #2  Palliative right CESI(Series #2) Palliative bilateral cervical facet block  Palliative right cervical facet RFA#3(Last done on: 10/23/17) Palliative left cervical facet RFA #2 (Last done on: 06/26/2016)    Recent Visits Date Type Provider Dept  10/26/18 Office Visit Milinda Pointer, MD [facility]  10/06/18  Procedure visit Milinda Pointer, MD [facility]  Showing recent visits within past 90 days and meeting all other requirements   Today's Visits Date Type Provider Dept  12/21/18 Telemedicine Milinda Pointer, MD [facility]  Showing today's visits and meeting all other requirements   Future Appointments Date Type Provider Dept  02/03/19 Appointment Milinda Pointer, MD [facility]  Showing future appointments within next 90 days and meeting all other requirements   I discussed the assessment and treatment plan with the patient. The patient was provided an opportunity to ask questions and all were answered. The patient agreed with the plan and demonstrated an understanding of the instructions.  Patient advised to call back or seek an in-person evaluation if the symptoms or condition worsens.  Total duration of non-face-to-face encounter: 12 minutes.  Note by: Gaspar Cola, MD Date: 12/21/2018; Time: 2:41 PM  Note: This dictation was prepared with Dragon dictation. Any transcriptional errors that may result from this process are unintentional.  Disclaimer:  * Given the special circumstances of the COVID-19 pandemic, the federal government has announced that the Office for Civil Rights (OCR) will exercise its enforcement discretion and will not impose penalties on physicians using telehealth in the event of noncompliance with regulatory requirements under the [place] (HIPAA) in connection with the good faith provision of telehealth during the XX123456 national public health emergency. ([place])

## 2018-12-21 NOTE — Progress Notes (Signed)
Called patient to review information and she stated that she thinks she has figured out how to do the video chat. Patient was scheduled for today and did not know that her appt was resch for tomorrow at 1:00. Patient is now aware of appt.

## 2018-12-22 ENCOUNTER — Ambulatory Visit: Payer: Medicaid Other | Admitting: Pain Medicine

## 2019-01-13 NOTE — Progress Notes (Signed)
Cancelled appointment

## 2019-02-03 ENCOUNTER — Telehealth: Payer: Medicaid Other | Admitting: Pain Medicine

## 2019-03-18 ENCOUNTER — Encounter: Payer: Self-pay | Admitting: Pain Medicine

## 2019-03-21 NOTE — Progress Notes (Signed)
Patient: Tanya [name]  Service Category: E/M  Provider: Gaspar Cola, MD  DOB: 03/16/1960  DOS: 03/22/2019  Location: Office  MRN: [id number]  Setting: Ambulatory outpatient  Referring Provider: Ricardo Jericho*  Type: Established Patient  Specialty: Interventional Pain Management  PCP: Ricardo Jericho, NP  Location: Remote location  Delivery: TeleHealth     Virtual Encounter - Pain Management PROVIDER NOTE: Information contained herein reflects review and annotations entered in association with encounter. Interpretation of such information and data should be left to medically-trained personnel. Information provided to patient can be located elsewhere in the medical record under "Patient Instructions". Document created using STT-dictation technology, any transcriptional errors that may result from process are unintentional.    Contact & Pharmacy Preferred: [phone] Home: [phone] (home) Mobile: [phone] (mobile) E-mail: [email or web address]  [id number] [place] - [id number]: [phone]: [phone]  Pre-screening  Ms. Curl offered "in-person" vs "virtual" encounter. She indicated preferring virtual for this encounter.   Reason COVID-19*  Social distancing based on CDC and AMA recommendations.   I contacted DLollie Sailson 03/22/2019 via telephone.      I clearly identified myself as FGaspar Cola MD. I verified that I was speaking with the correct person using two identifiers (Name: DBAILIE CHRISTEN[place] and date of birth: 2September 18, 1962.  Consent I sought verbal advanced consent from DLollie Sailsfor virtual visit interactions. I informed Ms. Kautzman of possible security and privacy concerns, risks, and limitations associated with providing "not-in-person" medical evaluation and management services. I also informed Ms. Grillo of the availability of "in-person"  appointments. Finally, I informed her that there would be a charge for the virtual visit and that she could be  personally, fully or partially, financially responsible for it. Ms. SOsikaexpressed understanding and agreed to proceed.   Historic Elements   Ms. DJULEAH PARADISEis a 59y.o. year old, female patient evaluated today after her last contact with our practice on 12/21/2018. Ms. SMccullers has a past medical history of Anxiety, Bladder infection, Chronic lower back pain, Chronic neck pain, Depression, Diverticulitis, Hyperlipidemia, Lupus ([place]lmo, and Overactive bladder. She also  has a past surgical history that includes Abdominal hysterectomy; Colonoscopy; Colonoscopy with propofol (N/A, 07/03/2017); Tonsillectomy; Esophagogastroduodenoscopy (N/A, 10/21/2017); and Colonoscopy with propofol (N/A, 10/21/2017). Ms. SNorsworthyhas a current medication list which includes the following prescription(s): atorvastatin, cyclobenzaprine, duloxetine, [START ON 04/24/2019] gabapentin, mirtazapine, oxycodone, [START ON 04/21/2019] oxycodone, [START ON 05/21/2019] oxycodone, pantoprazole, triamcinolone cream, and pregabalin. She  reports that she has been smoking cigarettes. She has a 17.50 pack-year smoking history. She has never used smokeless tobacco. She reports that she does not drink alcohol or use drugs. Ms. SSchorschis allergic to pregabalin; trazodone; bupropion; methocarbamol; and tramadol.   HPI  Today, she is being contacted for medication management.  Pharmacotherapy Assessment  Analgesic: Oxycodone IR 5 mg, 1 tab PO BID (10 mg/day of oxycodone) MME/day: 15 mg/day.   Monitoring: [place] PMP: PDMP reviewed during this encounter.       Pharmacotherapy: No side-effects or adverse reactions reported. Compliance: No problems identified. Effectiveness: Clinically acceptable. Plan: Refer to "POC".  UDS:  Summary  Date Value Ref Range Status  03/25/2018 FINAL  Final    Comment:     ==================================================================== TOXASSURE SELECT 13 (MW) ==================================================================== Specimen Alert Note:  Urinary creatinine is low; ability to detect some drugs may be compromised.  Interpret results  with caution. ==================================================================== Test                             Result       Flag       Units Drug Present and Declared for Prescription Verification   Oxycodone                      1447         EXPECTED   ng/mg creat   Oxymorphone                    753          EXPECTED   ng/mg creat   Noroxycodone                   2053         EXPECTED   ng/mg creat    Sources of oxycodone include scheduled prescription medications.    Oxymorphone and noroxycodone are expected metabolites of    oxycodone. Oxymorphone is also available as a scheduled    prescription medication. Drug Absent but Declared for Prescription Verification   Tramadol                       Not Detected UNEXPECTED ng/mg creat ==================================================================== Test                      Result    Flag   Units      Ref Range   Creatinine              17        L      mg/dL      >=20 ==================================================================== Declared Medications:  The flagging and interpretation on this report are based on the  following declared medications.  Unexpected results may arise from  inaccuracies in the declared medications.  **Note: The testing scope of this panel includes these medications:  Oxycodone  Oxycodone (Oxycodone Acetaminophen)  Tramadol  **Note: The testing scope of this panel does not include following  reported medications:  Acetaminophen (Oxycodone Acetaminophen)  Bupropion  Cyclobenzaprine  Duloxetine  Gabapentin  Hydroxychloroquine  Methocarbamol  Nefazodone  Pantoprazole   Trazodone ==================================================================== For clinical consultation, please call [phone]. ====================================================================    Laboratory Chemistry Profile   Renal Lab Results  Component Value Date   BUN 10 09/03/2017   CREATININE 0.67 09/03/2017   BCR 15 09/03/2017   GFRAA 114 09/03/2017   GFRNONAA 99 09/03/2017    Hepatic Lab Results  Component Value Date   AST 15 09/03/2017   ALT 16 09/08/2016   ALBUMIN 4.4 09/03/2017   ALKPHOS 30 (L) 09/03/2017   LIPASE 37 09/08/2016    Electrolytes Lab Results  Component Value Date   NA 137 09/03/2017   K 4.2 09/03/2017   CL 101 09/03/2017   CALCIUM 9.4 09/03/2017   MG 1.9 09/03/2017    Bone Lab Results  Component Value Date   25OHVITD1 25 (L) 09/03/2017   25OHVITD2 2.9 09/03/2017   25OHVITD3 22 09/03/2017    Inflammation (CRP: Acute Phase) (ESR: Chronic Phase) Lab Results  Component Value Date   CRP 2 09/03/2017   ESRSEDRATE 21 09/03/2017      Note: Above Lab results reviewed.  Imaging  DG PAIN CLINIC C-ARM 1-60 MIN NO REPORT Fluoro was used, but no Radiologist interpretation  will be provided.  Please refer to "NOTES" tab for provider progress note.  Assessment  Diagnoses of Fibromyalgia, Chronic musculoskeletal pain, Chronic pain syndrome, and Neurogenic pain were pertinent to this visit.  Plan of Care  Problem-specific:  No problem-specific Assessment & Plan notes found for this encounter.  Ms. TAMAKA SAWIN has a current medication list which includes the following long-term medication(s): cyclobenzaprine, [START ON 04/24/2019] gabapentin, mirtazapine, oxycodone, [START ON 04/21/2019] oxycodone, [START ON 05/21/2019] oxycodone, and pregabalin.  Pharmacotherapy (Medications Ordered): Meds ordered this encounter  Medications  . cyclobenzaprine (FLEXERIL) 5 MG tablet    Sig: Take 1 tablet (5 mg total) by mouth 3 (three) times daily.  Must last 30 days    Dispense:  90 tablet    Refill:  5    Fill one day early if pharmacy is closed on scheduled refill date. May substitute for generic if available.  Marland Kitchen oxyCODONE (OXY IR/ROXICODONE) 5 MG immediate release tablet    Sig: Take 1 tablet (5 mg total) by mouth 2 (two) times daily. Must last 30 days    Dispense:  60 tablet    Refill:  0    Chronic Pain: STOP Act (Not applicable) Fill 1 day early if closed on refill date. Do not fill until: 03/22/2019. To last until: 04/21/2019. Avoid benzodiazepines within 8 hours of opioids  . gabapentin (NEURONTIN) 300 MG capsule    Sig: Take 1 capsule (300 mg total) by mouth 3 (three) times daily.    Dispense:  90 capsule    Refill:  5    Fill one day early if pharmacy is closed on scheduled refill date. May substitute for generic if available.  Marland Kitchen oxyCODONE (OXY IR/ROXICODONE) 5 MG immediate release tablet    Sig: Take 1 tablet (5 mg total) by mouth 2 (two) times daily. Must last 30 days    Dispense:  60 tablet    Refill:  0    Chronic Pain: STOP Act (Not applicable) Fill 1 day early if closed on refill date. Do not fill until: 04/21/2019. To last until: 05/21/2019. Avoid benzodiazepines within 8 hours of opioids  . oxyCODONE (OXY IR/ROXICODONE) 5 MG immediate release tablet    Sig: Take 1 tablet (5 mg total) by mouth 2 (two) times daily. Must last 30 days    Dispense:  60 tablet    Refill:  0    Chronic Pain: STOP Act (Not applicable) Fill 1 day early if closed on refill date. Do not fill until: 05/21/2019. To last until: 06/20/2019. Avoid benzodiazepines within 8 hours of opioids   Orders:  No orders of the defined types were placed in this encounter.  Follow-up plan:   Return in about 3 months (around 06/16/2019) for (VV), (MM).      Interventional management options:  Considering:   Diagnostic right suprascapular NB Possible right suprascapular nerve RFA Diagnostic bilateral lumbar facet block Possible bilateral lumbar  facetRFA Possible left-sided occipital nerve RFA    Palliative PRN treatment(s):   Palliative right glenohumeral joint injection #3  Palliative right AC joint injection #4  Palliative left medial epicondyle elbow injection #2  Diagnostic left GONB #2  Palliative right CESI(Series #2) Palliative bilateral cervical facet block  Palliative right cervical facet RFA#3(Last done on: 10/23/17) Palliative left cervical facet RFA #2 (Last done on: 06/26/2016)     Recent Visits No visits were found meeting these conditions.  Showing recent visits within past 90 days and meeting all other requirements  Today's Visits Date Type Provider Dept  03/22/19 Telemedicine Milinda Pointer, MD [facility]  Showing today's visits and meeting all other requirements   Future Appointments No visits were found meeting these conditions.  Showing future appointments within next 90 days and meeting all other requirements   I discussed the assessment and treatment plan with the patient. The patient was provided an opportunity to ask questions and all were answered. The patient agreed with the plan and demonstrated an understanding of the instructions.  Patient advised to call back or seek an in-person evaluation if the symptoms or condition worsens.  Duration of encounter: 12 minutes.  Note by: Gaspar Cola, MD Date: 03/22/2019; Time: 1:15 PM

## 2019-03-22 ENCOUNTER — Other Ambulatory Visit: Payer: Self-pay

## 2019-03-22 ENCOUNTER — Ambulatory Visit: Payer: Medicaid Other | Attending: Pain Medicine | Admitting: Pain Medicine

## 2019-03-22 DIAGNOSIS — M797 Fibromyalgia: Secondary | ICD-10-CM

## 2019-03-22 DIAGNOSIS — M7918 Myalgia, other site: Secondary | ICD-10-CM

## 2019-03-22 DIAGNOSIS — M792 Neuralgia and neuritis, unspecified: Secondary | ICD-10-CM

## 2019-03-22 DIAGNOSIS — G894 Chronic pain syndrome: Secondary | ICD-10-CM | POA: Diagnosis not present

## 2019-03-22 DIAGNOSIS — G8929 Other chronic pain: Secondary | ICD-10-CM

## 2019-03-22 MED ORDER — OXYCODONE HCL 5 MG PO TABS: 5.0000 mg | ORAL_TABLET | Freq: Two times a day (BID) | ORAL | 0 refills | Status: DC

## 2019-03-22 MED ORDER — GABAPENTIN 300 MG PO CAPS: 300.0000 mg | ORAL_CAPSULE | Freq: Three times a day (TID) | ORAL | 5 refills | Status: DC

## 2019-03-22 MED ORDER — CYCLOBENZAPRINE HCL 5 MG PO TABS: 5.0000 mg | ORAL_TABLET | Freq: Three times a day (TID) | ORAL | 5 refills | Status: DC

## 2019-06-09 ENCOUNTER — Emergency Department
Admission: EM | Admit: 2019-06-09 | Discharge: 2019-06-09 | Disposition: A | Discharge status: Home / Self Care | Payer: Medicaid Other | Attending: Emergency Medicine | Admitting: Emergency Medicine

## 2019-06-09 ENCOUNTER — Other Ambulatory Visit: Payer: Self-pay

## 2019-06-09 ENCOUNTER — Emergency Department: Payer: Medicaid Other

## 2019-06-09 ENCOUNTER — Encounter: Payer: Self-pay | Admitting: Emergency Medicine

## 2019-06-09 ENCOUNTER — Ambulatory Visit
Admission: EM | Admit: 2019-06-09 | Discharge: 2019-06-09 | Disposition: A | Discharge status: Home / Self Care | Payer: Medicaid Other | Attending: Family Medicine | Admitting: Family Medicine

## 2019-06-09 DIAGNOSIS — R1084 Generalized abdominal pain: Secondary | ICD-10-CM | POA: Insufficient documentation

## 2019-06-09 DIAGNOSIS — K5900 Constipation, unspecified: Secondary | ICD-10-CM | POA: Diagnosis not present

## 2019-06-09 DIAGNOSIS — F17210 Nicotine dependence, cigarettes, uncomplicated: Secondary | ICD-10-CM | POA: Insufficient documentation

## 2019-06-09 DIAGNOSIS — J40 Bronchitis, not specified as acute or chronic: Secondary | ICD-10-CM | POA: Insufficient documentation

## 2019-06-09 DIAGNOSIS — R509 Fever, unspecified: Secondary | ICD-10-CM | POA: Diagnosis not present

## 2019-06-09 DIAGNOSIS — R1013 Epigastric pain: Secondary | ICD-10-CM | POA: Insufficient documentation

## 2019-06-09 DIAGNOSIS — R140 Abdominal distension (gaseous): Secondary | ICD-10-CM | POA: Diagnosis present

## 2019-06-09 DIAGNOSIS — R109 Unspecified abdominal pain: Secondary | ICD-10-CM

## 2019-06-09 DIAGNOSIS — R1031 Right lower quadrant pain: Secondary | ICD-10-CM | POA: Insufficient documentation

## 2019-06-09 DIAGNOSIS — Z79899 Other long term (current) drug therapy: Secondary | ICD-10-CM | POA: Insufficient documentation

## 2019-06-09 DIAGNOSIS — R195 Other fecal abnormalities: Secondary | ICD-10-CM | POA: Diagnosis present

## 2019-06-09 HISTORY — DX: Systemic involvement of connective tissue, unspecified: M35.9

## 2019-06-09 LAB — COMPREHENSIVE METABOLIC PANEL
ALT: 20 U/L (ref 0–44)
AST: 26 U/L (ref 15–41)
Albumin: 4.1 g/dL (ref 3.5–5.0)
Alkaline Phosphatase: 29 U/L — ABNORMAL LOW (ref 38–126)
Anion gap: 10 (ref 5–15)
BUN: 7 mg/dL (ref 6–20)
CO2: 23 mmol/L (ref 22–32)
Calcium: 8.4 mg/dL — ABNORMAL LOW (ref 8.9–10.3)
Chloride: 102 mmol/L (ref 98–111)
Creatinine, Ser: 0.72 mg/dL (ref 0.44–1.00)
GFR calc Af Amer: >60 mL/min (ref >60)
GFR calc non Af Amer: >60 mL/min (ref >60)
Glucose, Bld: 85 mg/dL (ref 70–99)
Potassium: 4.2 mmol/L (ref 3.5–5.1)
Sodium: 135 mmol/L (ref 135–145)
Total Bilirubin: 1 mg/dL (ref 0.3–1.2)
Total Protein: 7.2 g/dL (ref 6.5–8.1)

## 2019-06-09 LAB — CBC
HCT: 43 % (ref 36.0–46.0)
Hemoglobin: 14.2 g/dL (ref 12.0–15.0)
MCH: 30.3 pg (ref 26.0–34.0)
MCHC: 33 g/dL (ref 30.0–36.0)
MCV: 91.9 fL (ref 80.0–100.0)
Platelets: 256 10*3/uL (ref 150–400)
RBC: 4.68 MIL/uL (ref 3.87–5.11)
RDW: 13.1 % (ref 11.5–15.5)
WBC: 5.8 10*3/uL (ref 4.0–10.5)
nRBC: 0 % (ref 0.0–0.2)

## 2019-06-09 LAB — URINALYSIS, COMPLETE (UACMP) WITH MICROSCOPIC
Bilirubin Urine: NEGATIVE
Glucose, UA: NEGATIVE mg/dL
Ketones, ur: NEGATIVE mg/dL
Leukocytes,Ua: NEGATIVE
Nitrite: NEGATIVE
Protein, ur: NEGATIVE mg/dL
Specific Gravity, Urine: 1.015 (ref 1.005–1.030)
WBC, UA: NONE SEEN WBC/hpf (ref 0–5)
pH: 7 (ref 5.0–8.0)

## 2019-06-09 LAB — LIPASE, BLOOD: Lipase: 35 U/L (ref 11–51)

## 2019-06-09 MED ORDER — IOHEXOL 300 MG/ML  SOLN
100.0000 mL | Freq: Once | INTRAMUSCULAR | Status: AC | PRN
  Administered 2019-06-09: 100 mL via INTRAVENOUS

## 2019-06-09 MED ORDER — ALBUTEROL SULFATE HFA 108 (90 BASE) MCG/ACT IN AERS
2.0000 | INHALATION_SPRAY | RESPIRATORY_TRACT | 0 refills | Status: DC | PRN
Start: 2019-06-09 — End: 2019-09-06

## 2019-06-09 MED ORDER — PREDNISONE 20 MG PO TABS: 40.0000 mg | ORAL_TABLET | Freq: Every day | ORAL | 0 refills | Status: AC

## 2019-06-09 MED ORDER — AZITHROMYCIN 250 MG PO TABS
ORAL_TABLET | ORAL | 0 refills | Status: AC
Start: 2019-06-09 — End: 2019-06-14

## 2019-06-09 MED ORDER — MAGNESIUM CITRATE PO SOLN
1.0000 | Freq: Once | ORAL | 0 refills | Status: AC
Start: 2019-06-09 — End: 2019-06-09

## 2019-06-09 NOTE — ED Triage Notes (Signed)
See first nurse note. Pt reporting black stools, abdominal distention and fever. Pt reports she has not had a BM in 3 days. Pt in NAD at this time, speaking in complete sentences without difficulty.

## 2019-06-09 NOTE — ED Triage Notes (Signed)
Patient states she has been bloated x 1 week. Last BM was 3 days ago. She reports black stools. She states she has not taken any medications for this. She reports a fever of 102.0 last night. She also reports dysuria.

## 2019-06-09 NOTE — ED Triage Notes (Signed)
Pt in via EMS from [place]. EMS reports possible GI bleed ot appendicitis. MUC CT scan down. Last bm 3 days ago. Pt thought she was constipated. Pt with black  140/62, HR 90's, 100%RA, #20 g left forearm placed at [place]. EMS reports abd distended and firm.

## 2019-06-09 NOTE — ED Provider Notes (Signed)
[facility]    CSN: [id number] Arrival date & time: 06/09/19  1333      History   Chief Complaint Chief Complaint  Patient presents with  . Abdominal Pain  . Bloated  . Dysuria   HPI  59 year old female presents with multiple complaints.  Patient reports that her symptoms started approximately 1 week ago.  She states that her abdomen has felt bloated and distended.  She states that she has had constipation.  Her last bowel movement was 3 days ago.  She has had 1 bout of emesis.  She states that she has not eaten anything today.  She had one meal yesterday.  She reports her last bowel movement 3 days ago was very dark and black.  She does take chronic pain medication but does not take any NSAIDs or Goody powder/BC powder.  She reports that she had a fever last night, T-max 102.  She also reports ongoing dysuria.  Abdominal pain is particularly prominent in the epigastric region.  Although she has lower abdominal pain as well.  No relieving factors.  No known inciting event.  Past Medical History:  Diagnosis Date  . Anxiety   . Bladder infection    8/18  . Chronic lower back pain   . Chronic neck pain   . Depression   . Diverticulitis   . Hyperlipidemia   . Lupus ([place])   . Overactive bladder     Patient Active Problem List   Diagnosis Date Noted  . 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 Osteoarthritis of AC (acromioclavicular) joint (Right) 04/02/2018  . Chronic acromioclavicular joint pain (Right) 04/02/2018  . Chronic shoulder pain (Right) 04/02/2018  . Epicondylitis elbow, medial (Left) 04/02/2018  . Chronic elbow pain (Left) 03/25/2018  . Occipital headache (Left) 12/10/2017  . Neurogenic pain 12/10/2017  . Cervico-occipital neuralgia (Left) 12/10/2017  . Pharmacologic therapy 09/01/2017  . Disorder of skeletal system 09/01/2017  . Problems influencing health status 09/01/2017  . Spondylosis without myelopathy or radiculopathy,  cervical region 08/05/2017  . Cervicalgia 08/05/2017  . Rash 05/13/2017  . Cervical spondylosis 04/15/2016  . Cervical facet syndrome (Bilateral) (R>L) 04/15/2016  . Shoulder radicular pain (Bilateral) (R>L) 04/15/2016  . Chronic musculoskeletal pain 03/07/2016  . Vitamin D insufficiency 12/19/2015  . Anxiety 11/23/2015  . Chronic low back pain (Third area of Pain) (Bilateral) (R>L) 11/23/2015  . Chronic neck pain (Primary Area of Pain) (Bilateral) (R>L) 11/23/2015  . Long term prescription benzodiazepine use 11/23/2015  . Long term current use of opiate analgesic 11/23/2015  . Long term prescription opiate use 11/23/2015  . Opiate use (10 MME/Day) 11/23/2015  . Chronic pain syndrome 11/23/2015  . Chronic upper extremity pain (Right) 11/23/2015  . Chronic cervical radicular pain (Right) 11/23/2015  . Chronic shoulder pain (Bilateral) (R>L) 11/23/2015  . Hypercholesterolemia 06/20/2015  . 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 Encounter for long-term (current) use of high-risk medication 06/06/2015  . Pain medication agreement signed 11/24/2014  . Hyperlipidemia 01/25/2014  . 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 Diverticulitis 10/04/2012  . Osteoarthritis 10/04/2012  . Chronic upper back pain (Secondary area of Pain) (Bilateral) (R>L) 08/17/2012  . Urinary incontinence 08/17/2012  . Fibromyalgia 06/04/2012  . SLE (systemic lupus erythematosus) ([place]) 02/18/2012  . Generalized anxiety disorder 01/07/2012  . Major depressive disorder, recurrent ([place]) 09/29/2009    Past Surgical History:  Procedure Laterality Date  . ABDOMINAL HYSTERECTOMY    . COLONOSCOPY    . COLONOSCOPY WITH PROPOFOL  N/A 07/03/2017   Procedure: COLONOSCOPY WITH PROPOFOL;  Surgeon: [name], MD;  Location: [facility] ENDOSCOPY;  Service: Endoscopy;  Laterality: N/A;  . COLONOSCOPY WITH PROPOFOL N/A 10/21/2017   Procedure: COLONOSCOPY WITH PROPOFOL;  Surgeon: [name], MD;  Location: [facility]  ENDOSCOPY;  Service: Endoscopy;  Laterality: N/A;  . ESOPHAGOGASTRODUODENOSCOPY N/A 10/21/2017   Procedure: ESOPHAGOGASTRODUODENOSCOPY (EGD);  Surgeon: [name], MD;  Location: [facility] ENDOSCOPY;  Service: Endoscopy;  Laterality: N/A;  . TONSILLECTOMY      OB History   No obstetric history on file.      Home Medications    Prior to Admission medications   Medication Sig Start Date End Date Taking? Authorizing Provider  atorvastatin (LIPITOR) 20 MG tablet Take by mouth. 11/12/18 11/12/19 Yes [provider]  cyclobenzaprine (FLEXERIL) 5 MG tablet Take 1 tablet (5 mg total) by mouth 3 (three) times daily. Must last 30 days 03/22/19 09/18/19 Yes Milinda Pointer, MD  DULoxetine (CYMBALTA) 30 MG capsule Take 30 mg by mouth daily.   Yes [provider]  gabapentin (NEURONTIN) 300 MG capsule Take 1 capsule (300 mg total) by mouth 3 (three) times daily. 04/24/19 10/21/19 Yes Milinda Pointer, MD  mirtazapine (REMERON) 15 MG tablet Take 15 mg by mouth at bedtime.   Yes [provider]  oxyCODONE (OXY IR/ROXICODONE) 5 MG immediate release tablet Take 1 tablet (5 mg total) by mouth 2 (two) times daily. Must last 30 days 05/21/19 06/20/19 Yes Milinda Pointer, MD  oxyCODONE (OXY IR/ROXICODONE) 5 MG immediate release tablet Take 1 tablet (5 mg total) by mouth 2 (two) times daily. Must last 30 days 03/22/19 04/21/19  Milinda Pointer, MD  oxyCODONE (OXY IR/ROXICODONE) 5 MG immediate release tablet Take 1 tablet (5 mg total) by mouth 2 (two) times daily. Must last 30 days 04/21/19 05/21/19  Milinda Pointer, MD  pantoprazole (PROTONIX) 40 MG tablet Take by mouth. 12/11/18   [provider]  pregabalin (LYRICA) 50 MG capsule TAKE 1 CAPSULE (50 MG TOTAL) BY MOUTH 2 (TWO) TIMES DAILY 09/10/18   [provider]  triamcinolone cream (KENALOG) 0.1 % Apply 1 application topically 2 (two) times daily.    [provider]    Family History Family History    Problem Relation Age of Onset  . Cancer Mother   . Hypertension Mother   . Emphysema Mother   . Stroke Mother   . Glaucoma Father   . Heart disease Father   . Diabetes Brother     Social History Social History   Tobacco Use  . Smoking status: Current Every Day Smoker    Packs/day: 0.50    Years: 35.00    Pack years: 17.50    Types: Cigarettes  . Smokeless tobacco: Never Used  Substance Use Topics  . Alcohol use: No  . Drug use: No     Allergies   Pregabalin, Trazodone, Bupropion, Methocarbamol, and Tramadol   Review of Systems Review of Systems  Constitutional: Positive for appetite change and fever.  Gastrointestinal: Positive for abdominal distention, abdominal pain, nausea and vomiting.       Dark stool.  Musculoskeletal: Positive for arthralgias and myalgias.   Physical Exam Triage Vital Signs ED Triage Vitals  Enc Vitals Group     BP 06/09/19 1349 136/72     Pulse Rate 06/09/19 1349 95     Resp 06/09/19 1349 20     Temp 06/09/19 1349 99.8 F (37.7 C)     Temp Source  06/09/19 1349 Oral     SpO2 06/09/19 1349 97 %     Weight 06/09/19 1347 161 lb (73 kg)     Height 06/09/19 1347 5\' 3"  (1.6 m)     Head Circumference --      Peak Flow --      Pain Score 06/09/19 1347 8     Pain Loc --      Pain Edu? --      Excl. in [place]? --    Updated Vital Signs BP 136/72 (BP Location: Left Arm)   Pulse 95   Temp 99.8 F (37.7 C) (Oral)   Resp 20   Ht 5\' 3"  (1.6 m)   Wt 73 kg   SpO2 97%   BMI 28.52 kg/m   Visual Acuity Right Eye Distance:   Left Eye Distance:   Bilateral Distance:    Right Eye Near:   Left Eye Near:    Bilateral Near:     Physical Exam Vitals and nursing note reviewed.  Constitutional:      Comments: Ill-appearing female but in no acute distress.  HENT:     Head: Normocephalic and atraumatic.     Mouth/Throat:     Comments: Slightly dry mucous membranes. Eyes:     General:        Right eye: No discharge.        Left eye: No  discharge.     Conjunctiva/sclera: Conjunctivae normal.  Cardiovascular:     Rate and Rhythm: Normal rate and regular rhythm.  Pulmonary:     Effort: No respiratory distress.     Breath sounds: Wheezing present.  Abdominal:     General: Bowel sounds are normal.     Comments: Abdomen soft, appears mildly distended.  Exquisite tenderness in the epigastric region as well as the right lower quadrant.  Neurological:     Mental Status: She is alert.  Psychiatric:        Mood and Affect: Mood normal.        Behavior: Behavior normal.    UC Treatments / Results  Labs (all labs ordered are listed, but only abnormal results are displayed) Labs Reviewed  URINALYSIS, COMPLETE (UACMP) WITH MICROSCOPIC - Abnormal; Notable for the following components:      Result Value   Hgb urine dipstick TRACE (*)    Bacteria, UA RARE (*)    All other components within normal limits    EKG   Radiology No results found.  Procedures Procedures (including critical care time)  Medications Ordered in UC Medications - No data to display  Initial Impression / Assessment and Plan / UC Course  I have reviewed the triage vital signs and the nursing notes.  Pertinent labs & imaging results that were available during my care of the patient were reviewed by me and considered in my medical decision making (see chart for details).    59 year old female presents with ongoing abdominal pain, dark stool, fever, constipation.  Patient is mildly ill.  Patient needs further evaluation in the ER.  No CT is available here today.  DDx: Intra-abdominal infection, GI bleed, Ileus, Bowel obstruction.  No evidence of UTI here.  Patient is hemodynamically stable at this time.  She is going to the hospital via EMS for further evaluation and management.  I suspect that the patient will need admission to the hospital pending workup.  Final Clinical Impressions(s) / UC Diagnoses   Final diagnoses:  Epigastric pain  RLQ  abdominal  pain  Dark stools  Fever, unspecified fever cause   Discharge Instructions   None    ED Prescriptions    None     PDMP not reviewed this encounter.   Coral Spikes, [place] 06/09/19 1427

## 2019-06-09 NOTE — ED Provider Notes (Signed)
[facility] Emergency Department Provider Note ____________________________________________   First MD Initiated Contact with Patient 06/09/19 1828     (approximate)  I have reviewed the triage vital signs and the nursing notes.   HISTORY  Chief Complaint Abdominal Pain    HPI Tanya [name] is a 59 y.o. female with PMH as noted below who presents with abdominal distention over the last week, gradual onset, associated with some generalized abdominal pain, slightly worse on the right, as well as with dark stools.  The patient states that her last bowel movement was 3 days ago.  She denies any nausea or vomiting.  She has had subjective fevers.  Past Medical History:  Diagnosis Date  . Anxiety   . Bladder infection    8/18  . Chronic lower back pain   . Chronic neck pain   . Collagen vascular disease ([place])   . Depression   . Diverticulitis   . Hyperlipidemia   . Lupus ([place])   . Overactive bladder     Patient Active Problem List   Diagnosis Date Noted  . 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 Osteoarthritis of AC (acromioclavicular) joint (Right) 04/02/2018  . Chronic acromioclavicular joint pain (Right) 04/02/2018  . Chronic shoulder pain (Right) 04/02/2018  . Epicondylitis elbow, medial (Left) 04/02/2018  . Chronic elbow pain (Left) 03/25/2018  . Occipital headache (Left) 12/10/2017  . Neurogenic pain 12/10/2017  . Cervico-occipital neuralgia (Left) 12/10/2017  . Pharmacologic therapy 09/01/2017  . Disorder of skeletal system 09/01/2017  . Problems influencing health status 09/01/2017  . Spondylosis without myelopathy or radiculopathy, cervical region 08/05/2017  . Cervicalgia 08/05/2017  . Rash 05/13/2017  . Cervical spondylosis 04/15/2016  . Cervical facet syndrome (Bilateral) (R>L) 04/15/2016  . Shoulder radicular pain (Bilateral) (R>L) 04/15/2016  . Chronic musculoskeletal pain 03/07/2016  . Vitamin D  insufficiency 12/19/2015  . Anxiety 11/23/2015  . Chronic low back pain (Third area of Pain) (Bilateral) (R>L) 11/23/2015  . Chronic neck pain (Primary Area of Pain) (Bilateral) (R>L) 11/23/2015  . Long term prescription benzodiazepine use 11/23/2015  . Long term current use of opiate analgesic 11/23/2015  . Long term prescription opiate use 11/23/2015  . Opiate use (10 MME/Day) 11/23/2015  . Chronic pain syndrome 11/23/2015  . Chronic upper extremity pain (Right) 11/23/2015  . Chronic cervical radicular pain (Right) 11/23/2015  . Chronic shoulder pain (Bilateral) (R>L) 11/23/2015  . Hypercholesterolemia 06/20/2015  . 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 Encounter for long-term (current) use of high-risk medication 06/06/2015  . Pain medication agreement signed 11/24/2014  . Hyperlipidemia 01/25/2014  . 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 Diverticulitis 10/04/2012  . Osteoarthritis 10/04/2012  . Chronic upper back pain (Secondary area of Pain) (Bilateral) (R>L) 08/17/2012  . Urinary incontinence 08/17/2012  . Fibromyalgia 06/04/2012  . SLE (systemic lupus erythematosus) ([place]) 02/18/2012  . Generalized anxiety disorder 01/07/2012  . Major depressive disorder, recurrent ([place]) 09/29/2009    Past Surgical History:  Procedure Laterality Date  . ABDOMINAL HYSTERECTOMY    . COLONOSCOPY    . COLONOSCOPY WITH PROPOFOL N/A 07/03/2017   Procedure: COLONOSCOPY WITH PROPOFOL;  Surgeon: [name], MD;  Location: [facility] ENDOSCOPY;  Service: Endoscopy;  Laterality: N/A;  . COLONOSCOPY WITH PROPOFOL N/A 10/21/2017   Procedure: COLONOSCOPY WITH PROPOFOL;  Surgeon: [name], MD;  Location: [facility] ENDOSCOPY;  Service: Endoscopy;  Laterality: N/A;  . ESOPHAGOGASTRODUODENOSCOPY N/A 10/21/2017   Procedure: ESOPHAGOGASTRODUODENOSCOPY (EGD);  Surgeon: [name], MD;  Location: [facility] ENDOSCOPY;  Service: Endoscopy;  Laterality: N/A;  . TONSILLECTOMY      Prior to Admission  medications   Medication Sig Start Date End Date Taking? Authorizing Provider  albuterol (VENTOLIN HFA) 108 (90 Base) MCG/ACT inhaler Inhale 2 puffs into the lungs every 4 (four) hours as needed for wheezing or shortness of breath. 06/09/19   Arta Silence, MD  atorvastatin (LIPITOR) 20 MG tablet Take by mouth. 11/12/18 11/12/19  [provider]  azithromycin (ZITHROMAX Z-PAK) 250 MG tablet Take 2 tablets (500 mg) on  Day 1,  followed by 1 tablet (250 mg) once daily on Days 2 through 5. 06/09/19 06/14/19  Arta Silence, MD  cyclobenzaprine (FLEXERIL) 5 MG tablet Take 1 tablet (5 mg total) by mouth 3 (three) times daily. Must last 30 days 03/22/19 09/18/19  Milinda Pointer, MD  DULoxetine (CYMBALTA) 30 MG capsule Take 30 mg by mouth daily.    [provider]  gabapentin (NEURONTIN) 300 MG capsule Take 1 capsule (300 mg total) by mouth 3 (three) times daily. 04/24/19 10/21/19  Milinda Pointer, MD  magnesium citrate SOLN Take 296 mLs (1 Bottle total) by mouth once for 1 dose. 06/09/19 06/09/19  Arta Silence, MD  mirtazapine (REMERON) 15 MG tablet Take 15 mg by mouth at bedtime.    [provider]  oxyCODONE (OXY IR/ROXICODONE) 5 MG immediate release tablet Take 1 tablet (5 mg total) by mouth 2 (two) times daily. Must last 30 days 03/22/19 04/21/19  Milinda Pointer, MD  oxyCODONE (OXY IR/ROXICODONE) 5 MG immediate release tablet Take 1 tablet (5 mg total) by mouth 2 (two) times daily. Must last 30 days 04/21/19 05/21/19  Milinda Pointer, MD  oxyCODONE (OXY IR/ROXICODONE) 5 MG immediate release tablet Take 1 tablet (5 mg total) by mouth 2 (two) times daily. Must last 30 days 05/21/19 06/20/19  Milinda Pointer, MD  pantoprazole (PROTONIX) 40 MG tablet Take by mouth. 12/11/18   [provider]  predniSONE (DELTASONE) 20 MG tablet Take 2 tablets (40 mg total) by mouth daily for 5 days. 06/09/19 06/14/19  Arta Silence, MD  pregabalin (LYRICA) 50 MG  capsule TAKE 1 CAPSULE (50 MG TOTAL) BY MOUTH 2 (TWO) TIMES DAILY 09/10/18   [provider]  triamcinolone cream (KENALOG) 0.1 % Apply 1 application topically 2 (two) times daily.    [provider]    Allergies Pregabalin, Trazodone, Bupropion, Methocarbamol, and Tramadol  Family History  Problem Relation Age of Onset  . Cancer Mother   . Hypertension Mother   . Emphysema Mother   . Stroke Mother   . Glaucoma Father   . Heart disease Father   . Diabetes Brother     Social History Social History   Tobacco Use  . Smoking status: Current Every Day Smoker    Packs/day: 0.50    Years: 35.00    Pack years: 17.50    Types: Cigarettes  . Smokeless tobacco: Never Used  Substance Use Topics  . Alcohol use: No  . Drug use: No    Review of Systems  Constitutional: Positive for fever. Eyes: No redness. ENT: No sore throat. Cardiovascular: Denies chest pain. Respiratory: Denies shortness of breath. Gastrointestinal: No vomiting or diarrhea. Genitourinary: Negative for dysuria.  Musculoskeletal: Negative for back pain. Skin: Negative for rash. Neurological: Negative for headache.   ____________________________________________   PHYSICAL EXAM:  VITAL SIGNS: ED Triage Vitals  Enc Vitals Group     BP 06/09/19 1512 140/76     Pulse  Rate 06/09/19 1512 92     Resp 06/09/19 1512 18     Temp 06/09/19 1512 98.4 F (36.9 C)     Temp Source 06/09/19 1512 Oral     SpO2 06/09/19 1512 99 %     Weight 06/09/19 1513 163 lb (73.9 kg)     Height 06/09/19 1513 5\' 3"  (1.6 m)     Head Circumference --      Peak Flow --      Pain Score 06/09/19 1512 9     Pain Loc --      Pain Edu? --      Excl. in [place]? --     Constitutional: Alert and oriented. Well appearing and in no acute distress. Eyes: Conjunctivae are normal.  No scleral icterus. Head: Atraumatic. Nose: No congestion/rhinnorhea. Mouth/Throat: Mucous membranes are moist.   Neck: Normal range of motion.    Cardiovascular: Normal rate, regular rhythm. Grossly normal heart sounds.  Good peripheral circulation. Respiratory: Normal respiratory effort.  No retractions. Lungs CTAB. Gastrointestinal: Soft with mild distention, and mild right mid abdominal tenderness.  No peritoneal signs. Genitourinary: No flank tenderness. Musculoskeletal: Extremities warm and well perfused.  Neurologic:  Normal speech and language. No gross focal neurologic deficits are appreciated.  Skin:  Skin is warm and dry. No rash noted. Psychiatric: Mood and affect are normal. Speech and behavior are normal.  ____________________________________________   LABS (all labs ordered are listed, but only abnormal results are displayed)  Labs Reviewed  COMPREHENSIVE METABOLIC PANEL - Abnormal; Notable for the following components:      Result Value   Calcium 8.4 (*)    Alkaline Phosphatase 29 (*)    All other components within normal limits  CBC  LIPASE, BLOOD  URINALYSIS, COMPLETE (UACMP) WITH MICROSCOPIC  POC OCCULT BLOOD, ED  POC URINE PREG, ED   ____________________________________________  EKG   ____________________________________________  RADIOLOGY  CT abdomen: Large stool burden with no other acute intraabdominal findings.  Peripheral opacity left lower lobe.  ____________________________________________   PROCEDURES  Procedure(s) performed: No  Procedures  Critical Care performed: No ____________________________________________   INITIAL IMPRESSION / ASSESSMENT AND PLAN / ED COURSE  Pertinent labs & imaging results that were available during my care of the patient were reviewed by me and considered in my medical decision making (see chart for details).  59 year old female with PMH as noted above including a prior history of diverticulitis presents with abdominal distention, pain, and some dark stools over the last week, but with no blood.  The patient has now not had a bowel movement in the  last 3 days.  She has no vomiting but does report subjective fever.  I reviewed the past medical records in [place].  The patient was seen in the urgent care today for this and sent to the ED for imaging.  She was seen in the ED most recently 1 year ago with acute bronchitis.  On exam currently, she is overall relatively well-appearing.  Her vital signs are normal.  The abdomen is soft with possible mild distention and some right-sided discomfort but no significant focal tenderness.  Differential includes colitis, diverticulitis, UTI/cystitis, or less likely SBO, volvulus, or other surgical etiology.  Lab work-up obtained from triage is unremarkable, although urinalysis is still pending.  We will obtain a CT for further evaluation.  ----------------------------------------- 10:13 PM on 06/09/2019 -----------------------------------------  CT showed significant stool burden but no other acute intra-abdominal abnormalities.  It did show an area of consolidation  in the left lower lung.  I reviewed the patient's labs from earlier today including urinalysis which showed no acute findings.  In terms of the abdominal pain, presentation is most consistent with constipation.  I will discharge home with magnesium citrate.  Given that the patient does have cough, some shortness of breath, and subjective fevers, this is consistent with the left lower lobe opacity seen on CT.  However, the patient is overall resting comfortably, and has an O2 saturation in the high 90s on room air.  She is afebrile with reassuring labs.  At this time, she is stable for outpatient treatment.  She states she feels similar to when she has had bronchitis in the past.  In addition to the magnesium citrate, will prescribe a Z-Pak, steroid, and albuterol.  I discussed the results of the work-up with the patient.  She feels well to go home.  I gave her thorough return precautions and she expressed  understanding.   ____________________________________________   FINAL CLINICAL IMPRESSION(S) / ED DIAGNOSES  Final diagnoses:  Abdominal pain, unspecified abdominal location  Constipation, unspecified constipation type  Bronchitis      NEW MEDICATIONS STARTED DURING THIS VISIT:  Discharge Medication List as of 06/09/2019  9:17 PM    START taking these medications   Details  albuterol (VENTOLIN HFA) 108 (90 Base) MCG/ACT inhaler Inhale 2 puffs into the lungs every 4 (four) hours as needed for wheezing or shortness of breath., Starting Wed 06/09/2019, Normal    azithromycin (ZITHROMAX Z-PAK) 250 MG tablet Take 2 tablets (500 mg) on  Day 1,  followed by 1 tablet (250 mg) once daily on Days 2 through 5., Normal    magnesium citrate SOLN Take 296 mLs (1 Bottle total) by mouth once for 1 dose., Starting Wed 06/09/2019, Normal    predniSONE (DELTASONE) 20 MG tablet Take 2 tablets (40 mg total) by mouth daily for 5 days., Starting Wed 06/09/2019, Until Mon 06/14/2019, Normal         Note:  This document was prepared using Dragon voice recognition software and may include unintentional dictation errors.    Arta Silence, MD 06/09/19 2215

## 2019-06-09 NOTE — Discharge Instructions (Addendum)
Use the magnesium citrate to help relieve the constipation.  You can either take the whole bottle at once, or half the bottle, wait 4 hours, and take the rest if you have not had a bowel movement.  Take the prednisone and azithromycin over the next 5 days, and the albuterol if needed for shortness of breath.  Return to the ER for new, worsening or persistent severe abdominal pain, swelling, vomiting, black or tarry stools, blood in the stool, fever, shortness of breath or any other new or worsening symptoms that concern you.

## 2019-06-14 NOTE — Progress Notes (Signed)
Patient: Tanya [name]  Service Category: E/M  Provider: Gaspar Cola, MD  DOB: 18-Jul-1960  DOS: 06/16/2019  Location: Office  MRN: [id number]  Setting: Ambulatory outpatient  Referring Provider: Ricardo Jericho*  Type: Established Patient  Specialty: Interventional Pain Management  PCP: Ricardo Jericho, NP  Location: Remote location  Delivery: TeleHealth     Virtual Encounter - Pain Management PROVIDER NOTE: Information contained herein reflects review and annotations entered in association with encounter. Interpretation of such information and data should be left to medically-trained personnel. Information provided to patient can be located elsewhere in the medical record under "Patient Instructions". Document created using STT-dictation technology, any transcriptional errors that may result from process are unintentional.    Contact & Pharmacy Preferred: [phone] Home: [phone] (home) Mobile: [phone] (mobile) E-mail: [email or web address]  [id number] [place] [id number]: [phone]: [phone]  Pre-screening  Ms. Blattner offered "in-person" vs "virtual" encounter. She indicated preferring virtual for this encounter.   Reason COVID-19*  Social distancing based on CDC and AMA recommendations.   I contacted DLollie Sailson 06/16/2019 via telephone.      I clearly identified myself as FGaspar Cola MD. I verified that I was speaking with the correct person using two identifiers (Name: Tanya [name] and date of birth: [birth year].  Consent I sought verbal advanced consent from DLollie Sailsfor virtual visit interactions. I informed [name] of possible security and privacy concerns, risks, and limitations associated with providing "not-in-person" medical evaluation and management services. I also informed [name] of the availability of "in-person"  appointments. Finally, I informed her that there would be a charge for the virtual visit and that she could be  personally, fully or partially, financially responsible for it. Ms. SBadourexpressed understanding and agreed to proceed.   Historic Elements   Ms. Tanya CRIMIis a 59y.o. year old, female patient evaluated today after her last contact with our practice on 12/21/2018. Ms. SOsterloh has a past medical history of Anxiety, Bladder infection, Chronic lower back pain, Chronic neck pain, Collagen vascular disease (HBlessing, Depression, Diverticulitis, Hyperlipidemia, Lupus (HAntonito, and Overactive bladder. She also  has a past surgical history that includes Abdominal hysterectomy; Colonoscopy; Colonoscopy with propofol (N/A, 07/03/2017); Tonsillectomy; Esophagogastroduodenoscopy (N/A, 10/21/2017); and Colonoscopy with propofol (N/A, 10/21/2017). Ms. SKemmerhas a current medication list which includes the following prescription(s): albuterol, atorvastatin, cyclobenzaprine, doxycycline, duloxetine, gabapentin, mirtazapine, pantoprazole, triamcinolone cream, [START ON 06/20/2019] oxycodone, [START ON 07/20/2019] oxycodone, [START ON 08/19/2019] oxycodone, and pregabalin. She  reports that she has quit smoking. Her smoking use included cigarettes. She has a 17.50 pack-year smoking history. She has never used smokeless tobacco. She reports that she does not drink alcohol or use drugs. [name] allergic to pregabalin; trazodone; bupropion; methocarbamol; and tramadol.   HPI  Today, she is being contacted for medication management. The patient indicates doing well with the current medication regimen. No adverse reactions or side effects reported to the medications.  The patient indicates currently going through a COVID-19 infection.  She developed a pneumonia and came into [place], but she indicates that she was not really tested for Covid at that time and it was not until she went to another  emergency room closer to where she lives that she was tested and found to be positive.  She indicates feeling miserable, but somewhat better.  I encouraged the patient  to contact her primary care physician for follow-up and updates.  She understood and accepted.  The patient was also informed that her next medication refill will probably be done at our office.  She was also informed that we need to update her UDS and that somebody will be calling her for that.  She understood and accepted.  Pharmacotherapy Assessment  Analgesic: Oxycodone IR 5 mg, 1 tab PO BID (10 mg/day of oxycodone) MME/day: 15 mg/day.   Monitoring: [place] PMP: PDMP reviewed during this encounter.       Pharmacotherapy: No side-effects or adverse reactions reported. Compliance: No problems identified. Effectiveness: Clinically acceptable. Plan: Refer to "POC".  UDS:  Summary  Date Value Ref Range Status  03/25/2018 FINAL  Final    Comment:    ==================================================================== TOXASSURE SELECT 13 (MW) ==================================================================== Specimen Alert Note:  Urinary creatinine is low; ability to detect some drugs may be compromised.  Interpret results with caution. ==================================================================== Test                             Result       Flag       Units Drug Present and Declared for Prescription Verification   Oxycodone                      1447         EXPECTED   ng/mg creat   Oxymorphone                    753          EXPECTED   ng/mg creat   Noroxycodone                   2053         EXPECTED   ng/mg creat    Sources of oxycodone include scheduled prescription medications.    Oxymorphone and noroxycodone are expected metabolites of    oxycodone. Oxymorphone is also available as a scheduled    prescription medication. Drug Absent but Declared for Prescription Verification   Tramadol                        Not Detected UNEXPECTED ng/mg creat ==================================================================== Test                      Result    Flag   Units      Ref Range   Creatinine              17        L      mg/dL      >=20 ==================================================================== Declared Medications:  The flagging and interpretation on this report are based on the  following declared medications.  Unexpected results may arise from  inaccuracies in the declared medications.  **Note: The testing scope of this panel includes these medications:  Oxycodone  Oxycodone (Oxycodone Acetaminophen)  Tramadol  **Note: The testing scope of this panel does not include following  reported medications:  Acetaminophen (Oxycodone Acetaminophen)  Bupropion  Cyclobenzaprine  Duloxetine  Gabapentin  Hydroxychloroquine  Methocarbamol  Nefazodone  Pantoprazole  Trazodone ==================================================================== For clinical consultation, please call [phone]. ====================================================================    Laboratory Chemistry Profile   Renal Lab Results  Component Value Date   BUN 7 06/09/2019   CREATININE 0.72 06/09/2019   BCR 15 09/03/2017  GFRAA >60 06/09/2019   GFRNONAA >60 06/09/2019     Hepatic Lab Results  Component Value Date   AST 26 06/09/2019   ALT 20 06/09/2019   ALBUMIN 4.1 06/09/2019   ALKPHOS 29 (L) 06/09/2019   LIPASE 35 06/09/2019     Electrolytes Lab Results  Component Value Date   NA 135 06/09/2019   K 4.2 06/09/2019   CL 102 06/09/2019   CALCIUM 8.4 (L) 06/09/2019   MG 1.9 09/03/2017     Bone Lab Results  Component Value Date   25OHVITD1 25 (L) 09/03/2017   25OHVITD2 2.9 09/03/2017   25OHVITD3 22 09/03/2017     Inflammation (CRP: Acute Phase) (ESR: Chronic Phase) Lab Results  Component Value Date   CRP 2 09/03/2017   ESRSEDRATE 21 09/03/2017       Note: Above Lab  results reviewed.  Imaging  CT ABDOMEN PELVIS W CONTRAST CLINICAL DATA:  59 year old with abdominal distension, fever and black stools. Surgical history includes hysterectomy.  EXAM: CT ABDOMEN AND PELVIS WITH CONTRAST  TECHNIQUE: Multidetector CT imaging of the abdomen and pelvis was performed using the standard protocol following bolus administration of intravenous contrast.  CONTRAST:  146m OMNIPAQUE IOHEXOL 300 MG/ML IV. Oral contrast was also administered.  COMPARISON:  09/08/2016.  FINDINGS: Lower chest: Isolated peripheral ground-glass airspace opacity deep in the LEFT LOWER LOBE. Visualized lung bases otherwise clear. Heart size normal.  Hepatobiliary: Numerous hepatic cysts as noted previously, the largest measuring approximately 2 cm in the RIGHT lobe adjacent to the gallbladder. No suspicious hepatic parenchymal masses. Gallbladder normal in appearance without calcified gallstones. No biliary ductal dilation.  Pancreas: Normal in appearance without evidence of mass, ductal dilation, or inflammation.  Spleen: Normal in size and appearance.  Adrenals/Urinary Tract: Normal appearing adrenal glands. Subcentimeter cortical cysts in both kidneys. No significant focal parenchymal abnormality involving either kidney. No hydronephrosis. No urinary tract calculi. Normal appearing urinary bladder.  Stomach/Bowel: Stomach normal in appearance for the degree of distention. Normal-appearing small bowel. Very large stool burden throughout the colon. Extensive diffuse colonic diverticulosis without evidence of acute diverticulitis. Normal appendix in the RIGHT upper pelvis.  Vascular/Lymphatic: Severe aorto-iliofemoral atherosclerosis without evidence of aneurysm. Normal-appearing portal venous and systemic venous systems.  No pathologic lymphadenopathy.  Reproductive: Surgically absent uterus. Normal-appearing ovaries. No adnexal masses.  Other:  None.  Musculoskeletal: Degenerative disc disease and spondylosis at L5-S1 with a central disc protrusion. Facet degenerative changes at L4-5 and L5-S1. No acute findings.  IMPRESSION: 1. No acute abnormalities involving the abdomen or pelvis. 2. Very large stool burden throughout the colon. 3. Extensive diffuse colonic diverticulosis without evidence of acute diverticulitis. 4. Isolated peripheral ground-glass airspace opacity deep in the LEFT LOWER LOBE, possibly indicating an infectious or inflammatory process. 5. Aortic Atherosclerosis (ICD10-I70.0).  Electronically Signed   By: TEvangeline DakinM.D.   On: 06/09/2019 20:05  Assessment  The primary encounter diagnosis was Chronic pain syndrome. Diagnoses of Chronic neck pain (Primary Area of Pain) (Bilateral) (R>L), Chronic upper back pain (Secondary area of Pain) (Bilateral) (R>L), Chronic low back pain (Third area of Pain) (Bilateral) (R>L), and Pharmacologic therapy were also pertinent to this visit.  Plan of Care  Problem-specific:  No problem-specific Assessment & Plan notes found for this encounter.  Ms. DPRABHLEEN MONTEMAYORhas a current medication list which includes the following long-term medication(s): albuterol, cyclobenzaprine, gabapentin, mirtazapine, [START ON 06/20/2019] oxycodone, [START ON 07/20/2019] oxycodone, [START ON 08/19/2019] oxycodone, and pregabalin.  Pharmacotherapy (Medications Ordered): Meds  ordered this encounter  Medications  . oxyCODONE (OXY IR/ROXICODONE) 5 MG immediate release tablet    Sig: Take 1 tablet (5 mg total) by mouth 2 (two) times daily. Must last 30 days    Dispense:  60 tablet    Refill:  0    Chronic Pain: STOP Act (Not applicable) Fill 1 day early if closed on refill date. Do not fill until: 06/20/2019. To last until: 07/20/2019. Avoid benzodiazepines within 8 hours of opioids  . oxyCODONE (OXY IR/ROXICODONE) 5 MG immediate release tablet    Sig: Take 1 tablet (5 mg total) by mouth 2  (two) times daily. Must last 30 days    Dispense:  60 tablet    Refill:  0    Chronic Pain: STOP Act (Not applicable) Fill 1 day early if closed on refill date. Do not fill until: 07/20/2019. To last until: 08/19/2019. Avoid benzodiazepines within 8 hours of opioids  . oxyCODONE (OXY IR/ROXICODONE) 5 MG immediate release tablet    Sig: Take 1 tablet (5 mg total) by mouth 2 (two) times daily. Must last 30 days    Dispense:  60 tablet    Refill:  0    Chronic Pain: STOP Act (Not applicable) Fill 1 day early if closed on refill date. Do not fill until: 08/19/2019. To last until: 09/18/2019. Avoid benzodiazepines within 8 hours of opioids   Orders:  Orders Placed This Encounter  Procedures  . ToxASSURE Select 13 (MW), Urine    Volume: 30 ml(s). Minimum 3 ml of urine is needed. Document temperature of fresh sample. Indications: Long term (current) use of opiate analgesic (B26.203)    Order Specific Question:   Release to patient    Answer:   Immediate   Follow-up plan:   Return in about 13 weeks (around 09/15/2019) for (F2F), (MM).      Interventional management options:  Considering:   Diagnostic right suprascapular NB Possible right suprascapular nerve RFA Diagnostic bilateral lumbar facet block Possible bilateral lumbar facetRFA Possible left-sided occipital nerve RFA    Palliative PRN treatment(s):   Palliative right glenohumeral joint injection #3  Palliative right AC joint injection #4  Palliative left medial epicondyle elbow injection #2  Diagnostic left GONB #2  Palliative right CESI(Series #2) Palliative bilateral cervical facet block  Palliative right cervical facet RFA#3(Last done on: 10/23/17) Palliative left cervical facet RFA #2 (Last done on: 06/26/2016)      Recent Visits Date Type Provider Dept  03/22/19 Telemedicine Milinda Pointer, MD [facility]  Showing recent visits within past 90 days and meeting all other requirements   Today's  Visits Date Type Provider Dept  06/16/19 Telemedicine Milinda Pointer, MD [facility]  Showing today's visits and meeting all other requirements   Future Appointments No visits were found meeting these conditions.  Showing future appointments within next 90 days and meeting all other requirements   I discussed the assessment and treatment plan with the patient. The patient was provided an opportunity to ask questions and all were answered. The patient agreed with the plan and demonstrated an understanding of the instructions.  Patient advised to call back or seek an in-person evaluation if the symptoms or condition worsens.  Duration of encounter: 22 minutes.  Note by: Gaspar Cola, MD Date: 06/16/2019; Time: 10:49 AM

## 2019-06-15 ENCOUNTER — Telehealth: Payer: Self-pay | Admitting: *Deleted

## 2019-06-15 NOTE — Telephone Encounter (Signed)
Voicemail left with patient to please call to review medications prior to VV appt.

## 2019-06-16 ENCOUNTER — Ambulatory Visit: Payer: Medicaid Other | Attending: Pain Medicine | Admitting: Pain Medicine

## 2019-06-16 ENCOUNTER — Encounter: Payer: Self-pay | Admitting: Pain Medicine

## 2019-06-16 ENCOUNTER — Other Ambulatory Visit: Payer: Self-pay

## 2019-06-16 ENCOUNTER — Telehealth: Payer: Self-pay | Admitting: *Deleted

## 2019-06-16 DIAGNOSIS — Z79899 Other long term (current) drug therapy: Secondary | ICD-10-CM

## 2019-06-16 DIAGNOSIS — M545 Low back pain, unspecified: Secondary | ICD-10-CM

## 2019-06-16 DIAGNOSIS — M549 Dorsalgia, unspecified: Secondary | ICD-10-CM | POA: Diagnosis not present

## 2019-06-16 DIAGNOSIS — G894 Chronic pain syndrome: Secondary | ICD-10-CM

## 2019-06-16 DIAGNOSIS — M542 Cervicalgia: Secondary | ICD-10-CM

## 2019-06-16 DIAGNOSIS — G8929 Other chronic pain: Secondary | ICD-10-CM

## 2019-06-16 MED ORDER — OXYCODONE HCL 5 MG PO TABS: 5.0000 mg | ORAL_TABLET | Freq: Two times a day (BID) | ORAL | 0 refills | Status: DC

## 2019-07-08 LAB — TOXASSURE SELECT 13 (MW), URINE

## 2019-07-11 NOTE — Progress Notes (Deleted)
Patient has COVID-19.  Did not feel well.  Reschedule.

## 2019-07-12 ENCOUNTER — Ambulatory Visit: Payer: Medicaid Other | Admitting: Pain Medicine

## 2019-08-10 ENCOUNTER — Telehealth: Payer: Self-pay

## 2019-08-10 NOTE — Telephone Encounter (Signed)
She would like to talk to you about her medicines. She has flexiril and it makes her legs hurt. She wants to know if you could change her to a different muscle relaxer.Baclofen.

## 2019-08-10 NOTE — Telephone Encounter (Signed)
Talked with patient and was hard to hear her on the phone. It kept going in and out. Told patient that Dr Lowella Dandy did not change medication over the phone. She stated that she can take medication during the day but unable at night. She states it causes her legs to her.. patient last appt was May 2100.

## 2019-08-11 NOTE — Telephone Encounter (Signed)
Called and notified patient that MD will not change medication over the phone

## 2019-08-24 ENCOUNTER — Other Ambulatory Visit: Payer: Self-pay | Admitting: Pain Medicine

## 2019-08-24 ENCOUNTER — Encounter: Payer: Self-pay | Admitting: Pain Medicine

## 2019-08-24 ENCOUNTER — Telehealth: Payer: Self-pay

## 2019-08-24 DIAGNOSIS — G894 Chronic pain syndrome: Secondary | ICD-10-CM

## 2019-08-24 NOTE — Telephone Encounter (Signed)
Pharmacy says they do not have script.  Please call pharmacy  Per patient at 3:50 vmail

## 2019-08-24 NOTE — Telephone Encounter (Signed)
Patient notified that she has a script for oxycodone that could have been filled on 08-19-2019.

## 2019-08-24 NOTE — Telephone Encounter (Signed)
The pharmacy states she doesn't have an august script on file and she should. Can someone look into this for her please.

## 2019-08-25 ENCOUNTER — Encounter: Payer: Self-pay | Admitting: *Deleted

## 2019-08-27 ENCOUNTER — Other Ambulatory Visit: Payer: Self-pay | Admitting: Pain Medicine

## 2019-08-27 DIAGNOSIS — G894 Chronic pain syndrome: Secondary | ICD-10-CM

## 2019-09-03 ENCOUNTER — Other Ambulatory Visit: Payer: Self-pay | Admitting: Pain Medicine

## 2019-09-03 DIAGNOSIS — G894 Chronic pain syndrome: Secondary | ICD-10-CM

## 2019-09-03 MED ORDER — OXYCODONE HCL 5 MG PO TABS: 5.0000 mg | ORAL_TABLET | Freq: Two times a day (BID) | ORAL | 0 refills | Status: DC

## 2019-09-06 ENCOUNTER — Other Ambulatory Visit: Payer: Self-pay

## 2019-09-06 ENCOUNTER — Encounter: Payer: Self-pay | Admitting: Pain Medicine

## 2019-09-06 ENCOUNTER — Ambulatory Visit: Payer: Medicaid Other | Attending: Pain Medicine | Admitting: Pain Medicine

## 2019-09-06 VITALS — BP 141/88 | HR 85 | Temp 97.2°F | Resp 15 | Ht 63.0 in | Wt 153.0 lb

## 2019-09-06 DIAGNOSIS — M797 Fibromyalgia: Secondary | ICD-10-CM

## 2019-09-06 DIAGNOSIS — G8929 Other chronic pain: Secondary | ICD-10-CM | POA: Diagnosis present

## 2019-09-06 DIAGNOSIS — M792 Neuralgia and neuritis, unspecified: Secondary | ICD-10-CM

## 2019-09-06 DIAGNOSIS — M19012 Primary osteoarthritis, left shoulder: Secondary | ICD-10-CM | POA: Diagnosis not present

## 2019-09-06 DIAGNOSIS — M25512 Pain in left shoulder: Secondary | ICD-10-CM | POA: Insufficient documentation

## 2019-09-06 DIAGNOSIS — M7918 Myalgia, other site: Secondary | ICD-10-CM | POA: Insufficient documentation

## 2019-09-06 DIAGNOSIS — G894 Chronic pain syndrome: Secondary | ICD-10-CM | POA: Diagnosis not present

## 2019-09-06 MED ORDER — OXYCODONE HCL 5 MG PO TABS: 5.0000 mg | ORAL_TABLET | Freq: Two times a day (BID) | ORAL | 0 refills | Status: DC

## 2019-09-06 MED ORDER — BACLOFEN 10 MG PO TABS: 10.0000 mg | ORAL_TABLET | Freq: Every day | ORAL | 2 refills | Status: DC

## 2019-09-06 MED ORDER — GABAPENTIN 300 MG PO CAPS: 300.0000 mg | ORAL_CAPSULE | Freq: Three times a day (TID) | ORAL | 2 refills | Status: DC

## 2019-09-06 NOTE — Patient Instructions (Addendum)
____________________________________________________________________________________________  Preparing for Procedure with Sedation  Procedure appointments are limited to planned procedures: . No Prescription Refills. . No disability issues will be discussed. . No medication changes will be discussed.  Instructions: . Oral Intake: Do not eat or drink anything for at least 8 hours prior to your procedure. (Exception: Blood Pressure Medication. See below.) . Transportation: Unless otherwise stated by your physician, you may drive yourself after the procedure. . Blood Pressure Medicine: Do not forget to take your blood pressure medicine with a sip of water the morning of the procedure. If your Diastolic (lower reading)is above 100 mmHg, elective cases will be cancelled/rescheduled. . Blood thinners: These will need to be stopped for procedures. Notify our staff if you are taking any blood thinners. Depending on which one you take, there will be specific instructions on how and when to stop it. . Diabetics on insulin: Notify the staff so that you can be scheduled 1st case in the morning. If your diabetes requires high dose insulin, take only  of your normal insulin dose the morning of the procedure and notify the staff that you have done so. . Preventing infections: Shower with an antibacterial soap the morning of your procedure. . Build-up your immune system: Take 1000 mg of Vitamin C with every meal (3 times a day) the day prior to your procedure. . Antibiotics: Inform the staff if you have a condition or reason that requires you to take antibiotics before dental procedures. . Pregnancy: If you are pregnant, call and cancel the procedure. . Sickness: If you have a cold, fever, or any active infections, call and cancel the procedure. . Arrival: You must be in the facility at least 30 minutes prior to your scheduled procedure. . Children: Do not bring children with you. . Dress appropriately:  Bring dark clothing that you would not mind if they get stained. . Valuables: Do not bring any jewelry or valuables.  Reasons to call and reschedule or cancel your procedure: (Following these recommendations will minimize the risk of a serious complication.) . Surgeries: Avoid having procedures within 2 weeks of any surgery. (Avoid for 2 weeks before or after any surgery). . Flu Shots: Avoid having procedures within 2 weeks of a flu shots or . (Avoid for 2 weeks before or after immunizations). . Barium: Avoid having a procedure within 7-10 days after having had a radiological study involving the use of radiological contrast. (Myelograms, Barium swallow or enema study). . Heart attacks: Avoid any elective procedures or surgeries for the initial 6 months after a "Myocardial Infarction" (Heart Attack). . Blood thinners: It is imperative that you stop these medications before procedures. Let us know if you if you take any blood thinner.  . Infection: Avoid procedures during or within two weeks of an infection (including chest colds or gastrointestinal problems). Symptoms associated with infections include: Localized redness, fever, chills, night sweats or profuse sweating, burning sensation when voiding, cough, congestion, stuffiness, runny nose, sore throat, diarrhea, nausea, vomiting, cold or Flu symptoms, recent or current infections. It is specially important if the infection is over the area that we intend to treat. . Heart and lung problems: Symptoms that may suggest an active cardiopulmonary problem include: cough, chest pain, breathing difficulties or shortness of breath, dizziness, ankle swelling, uncontrolled high or unusually low blood pressure, and/or palpitations. If you are experiencing any of these symptoms, cancel your procedure and contact your primary care physician for an evaluation.  Remember:  Regular Business hours are:    Monday to Thursday 8:00 AM to 4:00 PM  Provider's  Schedule: Milinda Pointer, MD:  Procedure days: Tuesday and Thursday 7:30 AM to 4:00 PM  Gillis Santa, MD:  Procedure days: Monday and Wednesday 7:30 AM to 4:00 PM ____________________________________________________________________________________________   ____________________________________________________________________________________________  Drug Holidays (Slow)  What is a "Drug Holiday"? Drug Holiday: is the name given to the period of time during which a patient stops taking a medication(s) for the purpose of eliminating tolerance to the drug.  Benefits . Improved effectiveness of opioids. . Decreased opioid dose needed to achieve benefits. . Improved pain with lesser dose.  What is tolerance? Tolerance: is the progressive decreased in effectiveness of a drug due to its repetitive use. With repetitive use, the body gets use to the medication and as a consequence, it loses its effectiveness. This is a common problem seen with opioid pain medications. As a result, a larger dose of the drug is needed to achieve the same effect that used to be obtained with a smaller dose.  How long should a "Drug Holiday" last? You should stay off of the pain medicine for at least 14 consecutive days. (2 weeks)  Should I stop the medicine "cold [place]"? No. You should always coordinate with your Pain Specialist so that he/she can provide you with the correct medication dose to make the transition as smoothly as possible.  How do I stop the medicine? Slowly. You will be instructed to decrease the daily amount of pills that you take by one (1) pill every seven (7) days. This is called a "slow downward taper" of your dose. For example: if you normally take four (4) pills per day, you will be asked to drop this dose to three (3) pills per day for seven (7) days, then to two (2) pills per day for seven (7) days, then to one (1) per day for seven (7) days, and at the end of those last seven (7)  days, this is when the "Drug Holiday" would start.   Will I have withdrawals? By doing a "slow downward taper" like this one, it is unlikely that you will experience any significant withdrawal symptoms. Typically, what triggers withdrawals is the sudden stop of a high dose opioid therapy. Withdrawals can usually be avoided by slowly decreasing the dose over a prolonged period of time. If you do not follow these instructions and decide to stop your medication abruptly, withdrawals may be possible.  What are withdrawals? Withdrawals: refers to the wide range of symptoms that occur after stopping or dramatically reducing opiate drugs after heavy and prolonged use. Withdrawal symptoms do not occur to patients that use low dose opioids, or those who take the medication sporadically. Contrary to benzodiazepine (example: Valium, Xanax, etc.) or alcohol withdrawals ("Delirium Tremens"), opioid withdrawals are not lethal. Withdrawals are the physical manifestation of the body getting rid of the excess receptors.  Expected Symptoms Early symptoms of withdrawal may include: . Agitation . Anxiety . Muscle aches . Increased tearing . Insomnia . Runny nose . Sweating . Yawning  Late symptoms of withdrawal may include: . Abdominal cramping . Diarrhea . Dilated pupils . Goose bumps . Nausea . Vomiting  Will I experience withdrawals? Due to the slow nature of the taper, it is very unlikely that you will experience any.  What is a slow taper? Taper: refers to the gradual decrease in dose.  (Last update: 08/11/2019) ____________________________________________________________________________________________    ____________________________________________________________________________________________  Medication Rules  Purpose: To inform patients, and their  family members, of our rules and regulations.  Applies to: All patients receiving prescriptions (written or electronic).  Pharmacy of  record: Pharmacy where electronic prescriptions will be sent. If written prescriptions are taken to a different pharmacy, please inform the nursing staff. The pharmacy listed in the electronic medical record should be the one where you would like electronic prescriptions to be sent.  Electronic prescriptions: In compliance with the [place] (STOP) Act of 2017 (Session Lanny Cramp [phone]), effective January 21, 2018, all controlled substances must be electronically prescribed. Calling prescriptions to the pharmacy will cease to exist.  Prescription refills: Only during scheduled appointments. Applies to all prescriptions.  NOTE: The following applies primarily to controlled substances (Opioid* Pain Medications).   Type of encounter (visit): For patients receiving controlled substances, face-to-face visits are required. (Not an option or up to the patient.)  Patient's responsibilities: 1. Pain Pills: Bring all pain pills to every appointment (except for procedure appointments). 2. Pill Bottles: Bring pills in original pharmacy bottle. Always bring the newest bottle. Bring bottle, even if empty. 3. Medication refills: You are responsible for knowing and keeping track of what medications you take and those you need refilled. The day before your appointment: write a list of all prescriptions that need to be refilled. The day of the appointment: give the list to the admitting nurse. Prescriptions will be written only during appointments. No prescriptions will be written on procedure days. If you forget a medication: it will not be "Called in", "Faxed", or "electronically sent". You will need to get another appointment to get these prescribed. No early refills. Do not call asking to have your prescription filled early. 4. Prescription Accuracy: You are responsible for carefully inspecting your prescriptions before leaving our office. Have the discharge nurse  carefully go over each prescription with you, before taking them home. Make sure that your name is accurately spelled, that your address is correct. Check the name and dose of your medication to make sure it is accurate. Check the number of pills, and the written instructions to make sure they are clear and accurate. Make sure that you are given enough medication to last until your next medication refill appointment. 5. Taking Medication: Take medication as prescribed. When it comes to controlled substances, taking less pills or less frequently than prescribed is permitted and encouraged. Never take more pills than instructed. Never take medication more frequently than prescribed.  6. Inform other Doctors: Always inform, all of your healthcare providers, of all the medications you take. 7. Pain Medication from other Providers: You are not allowed to accept any additional pain medication from any other Doctor or Healthcare provider. There are two exceptions to this rule. (see below) In the event that you require additional pain medication, you are responsible for notifying us, as stated below. 8. Medication Agreement: You are responsible for carefully reading and following our Medication Agreement. This must be signed before receiving any prescriptions from our practice. Safely store a copy of your signed Agreement. Violations to the Agreement will result in no further prescriptions. (Additional copies of our Medication Agreement are available upon request.) 9. Laws, Rules, & Regulations: All patients are expected to follow all Federal and [employer], [employer], Rules, [employer] [facility]. Ignorance of the Laws does not constitute a valid excuse.  10. Illegal drugs and Controlled Substances: The use of illegal substances (including, but not limited to marijuana and its derivatives) and/or the illegal use of any controlled substances is strictly  prohibited. Violation of this rule may result in the immediate and  permanent discontinuation of any and all prescriptions being written by our practice. The use of any illegal substances is prohibited. 11. Adopted CDC guidelines & recommendations: Target dosing levels will be at or below 60 MME/day. Use of benzodiazepines** is not recommended.  Exceptions: There are only two exceptions to the rule of not receiving pain medications from other Healthcare Providers. 1. Exception #1 (Emergencies): In the event of an emergency (i.e.: accident requiring emergency care), you are allowed to receive additional pain medication. However, you are responsible for: As soon as you are able, call our office [phone], at any time of the day or night, and leave a message stating your name, the date and nature of the emergency, and the name and dose of the medication prescribed. In the event that your call is answered by a member of our staff, make sure to document and save the date, time, and the name of the person that took your information.  2. Exception #2 (Planned Surgery): In the event that you are scheduled by another doctor or dentist to have any type of surgery or procedure, you are allowed (for a period no longer than 30 days), to receive additional pain medication, for the acute post-op pain. However, in this case, you are responsible for picking up a copy of our "Post-op Pain Management for Surgeons" handout, and giving it to your surgeon or dentist. This document is available at our office, and does not require an appointment to obtain it. Simply go to our office during business hours (Monday-Thursday from 8:00 AM to 4:00 PM) (Friday 8:00 AM to 12:00 Noon) or if you have a scheduled appointment with [place], prior to your surgery, and ask for it by name. In addition, you will need to provide [place] with your name, name of your surgeon, type of surgery, and date of procedure or surgery.  *Opioid medications include: morphine, codeine, oxycodone, oxymorphone, hydrocodone,  hydromorphone, meperidine, tramadol, tapentadol, buprenorphine, fentanyl, methadone. **Benzodiazepine medications include: diazepam (Valium), alprazolam (Xanax), clonazepam (Klonopine), lorazepam (Ativan), clorazepate (Tranxene), chlordiazepoxide (Librium), estazolam (Prosom), oxazepam (Serax), temazepam (Restoril), triazolam (Halcion) (Last updated: 03/20/2017) ____________________________________________________________________________________________   ____________________________________________________________________________________________  Medication Recommendations and Reminders  Applies to: All patients receiving prescriptions (written and/or electronic).  Medication Rules & Regulations: These rules and regulations exist for your safety and that of others. They are not flexible and neither are we. Dismissing or ignoring them will be considered "non-compliance" with medication therapy, resulting in complete and irreversible termination of such therapy. (See document titled "Medication Rules" for more details.) In all conscience, because of safety reasons, we cannot continue providing a therapy where the patient does not follow instructions.  Pharmacy of record:   Definition: This is the pharmacy where your electronic prescriptions will be sent.   We do not endorse any particular pharmacy, however, we have experienced problems with [facility] not securing enough medication supply for the community.  We do not restrict you in your choice of pharmacy. However, once we write for your prescriptions, we will NOT be re-sending more prescriptions to fix restricted supply problems created by your pharmacy, or your insurance.   The pharmacy listed in the electronic medical record should be the one where you want electronic prescriptions to be sent.  If you choose to change pharmacy, simply notify our nursing staff.  Recommendations:  Keep all of your pain medications in a safe place, under  lock and key, even if you  live alone. We will NOT replace lost, stolen, or damaged medication.  After you fill your prescription, take 1 week's worth of pills and put them away in a safe place. You should keep a separate, properly labeled bottle for this purpose. The remainder should be kept in the original bottle. Use this as your primary supply, until it runs out. Once it's gone, then you know that you have 1 week's worth of medicine, and it is time to come in for a prescription refill. If you do this correctly, it is unlikely that you will ever run out of medicine.  To make sure that the above recommendation works, it is very important that you make sure your medication refill appointments are scheduled at least 1 week before you run out of medicine. To do this in an effective manner, make sure that you do not leave the office without scheduling your next medication management appointment. Always ask the nursing staff to show you in your prescription , when your medication will be running out. Then arrange for the receptionist to get you a return appointment, at least 7 days before you run out of medicine. Do not wait until you have 1 or 2 pills left, to come in. This is very poor planning and does not take into consideration that we may need to cancel appointments due to bad weather, sickness, or emergencies affecting our staff.  DO NOT ACCEPT A "Partial Fill": If for any reason your pharmacy does not have enough pills/tablets to completely fill or refill your prescription, do not allow for a "partial fill". The law allows the pharmacy to complete that prescription within 72 hours, without requiring a new prescription. If they do not fill the rest of your prescription within those 72 hours, you will need a separate prescription to fill the remaining amount, which we will NOT provide. If the reason for the partial fill is your insurance, you will need to talk to the pharmacist about payment alternatives for  the remaining tablets, but again, DO NOT ACCEPT A PARTIAL FILL, unless you can trust your pharmacist to obtain the remainder of the pills within 72 hours.  Prescription refills and/or changes in medication(s):   Prescription refills, and/or changes in dose or medication, will be conducted only during scheduled medication management appointments. (Applies to both, written and electronic prescriptions.)  No refills on procedure days. No medication will be changed or started on procedure days. No changes, adjustments, and/or refills will be conducted on a procedure day. Doing so will interfere with the diagnostic portion of the procedure.  No phone refills. No medications will be "called into the pharmacy".  No Fax refills.  No weekend refills.  No Holliday refills.  No after hours refills.  Remember:  Business hours are:  Monday to Thursday 8:00 AM to 4:00 PM Provider's Schedule: Milinda Pointer, MD - Appointments are:  Medication management: Monday and Wednesday 8:00 AM to 4:00 PM Procedure day: Tuesday and Thursday 7:30 AM to 4:00 PM Gillis Santa, MD - Appointments are:  Medication management: Tuesday and Thursday 8:00 AM to 4:00 PM Procedure day: Monday and Wednesday 7:30 AM to 4:00 PM (Last update: 08/11/2019) ____________________________________________________________________________________________   ____________________________________________________________________________________________  CBD (cannabidiol) WARNING  Applicable to: All individuals currently taking or considering taking CBD (cannabidiol) and, more important, all patients taking opioid analgesic controlled substances (pain medication). (Example: oxycodone; oxymorphone; hydrocodone; hydromorphone; morphine; methadone; tramadol; tapentadol; fentanyl; buprenorphine; butorphanol; dextromethorphan; meperidine; codeine; etc.)  Legal status: CBD remains a Schedule I  drug prohibited for any use. CBD is illegal  with one exception. In the [place], CBD has a limited Transport planner (FDA) approval for the treatment of two specific types of epilepsy disorders. Only one CBD product has been approved by the FDA for this purpose: "Epidiolex". FDA is aware that some companies are marketing products containing cannabis and cannabis-derived compounds in ways that violate the [employer], Drug and Cosmetic Act [employer] Act) and that may put the health and safety of consumers at risk. The FDA, a Federal agency, has not enforced the CBD status since 2018.   Legality: Some manufacturers ship CBD products nationally, which is illegal. Often such products are sold online and are therefore available throughout the country. CBD is openly sold in head shops and health food stores in some states where such sales have not been explicitly legalized. Selling unapproved products with unsubstantiated therapeutic claims is not only a violation of the law, but also can put patients at risk, as these products have not been proven to be safe or effective. Federal illegality makes it difficult to conduct research on CBD.  Reference: "FDA Regulation of Cannabis and Cannabis-Derived Products, Including Cannabidiol (CBD)" - [employer]  Warning: CBD is not FDA approved and has not undergo the same manufacturing controls as prescription drugs.  This means that the purity and safety of available CBD may be questionable. Most of the time, despite manufacturer's claims, it is contaminated with THC (delta-9-tetrahydrocannabinol - the chemical in marijuana responsible for the "HIGH").  When this is the case, the [facility] contaminant will trigger a positive urine drug screen (UDS) test for Marijuana (carboxy-THC). Because a positive UDS for any illicit substance is a violation of our medication agreement, your opioid  analgesics (pain medicine) may be permanently discontinued.  MORE ABOUT CBD  General Information: CBD  is a derivative of the Marijuana (cannabis sativa) plant discovered in 52. It is one of the 113 identified substances found in Marijuana. It accounts for up to 40% of the plant's extract. As of 2018, preliminary clinical studies on CBD included research for the treatment of anxiety, movement disorders, and pain. CBD is available and consumed in multiple forms, including inhalation of smoke or vapor, as an aerosol spray, and by mouth. It may be supplied as an oil containing CBD, capsules, dried cannabis, or as a liquid solution. CBD is thought not to be as psychoactive as THC (delta-9-tetrahydrocannabinol - the chemical in marijuana responsible for the "HIGH"). Studies suggest that CBD may interact with different biological target receptors in the body, including cannabinoid and other neurotransmitter receptors. As of 2018 the mechanism of action for its biological effects has not been determined.  Side-effects  Adverse reactions: Dry mouth, diarrhea, decreased appetite, fatigue, drowsiness, malaise, weakness, sleep disturbances, and others.  Drug interactions: CBC may interact with other medications such as blood-thinners. (Last update: 08/28/2019) ____________________________________________________________________________________________   Pain Management Discharge Instructions  General Discharge Instructions :  If you need to reach your doctor call: Monday-Friday 8:00 am - 4:00 pm at [phone] or toll free [phone].  After clinic hours [phone] to have operator reach doctor.  Bring all of your medication bottles to all your appointments in the pain clinic.  To cancel or reschedule your appointment with Pain Management please remember to call 24 hours in advance to avoid a fee.  Refer to the educational materials which you have been given on: General Risks, I had my  Procedure. Discharge Instructions, Post Sedation.  Post Procedure  Instructions:  The drugs you were given will stay in your system until tomorrow, so for the next 24 hours you should not drive, make any legal decisions or drink any alcoholic beverages.  You may eat anything you prefer, but it is better to start with liquids then soups and crackers, and gradually work up to solid foods.  Please notify your doctor immediately if you have any unusual bleeding, trouble breathing or pain that is not related to your normal pain.  Depending on the type of procedure that was done, some parts of your body may feel week and/or numb.  This usually clears up by tonight or the next day.  Walk with the use of an assistive device or accompanied by an adult for the 24 hours.  You may use ice on the affected area for the first 24 hours.  Put ice in a Ziploc bag and cover with a towel and place against area 15 minutes on 15 minutes off.  You may switch to heat after 24 hours.GENERAL RISKS AND COMPLICATIONS  What are the risk, side effects and possible complications? Generally speaking, most procedures are safe.  However, with any procedure there are risks, side effects, and the possibility of complications.  The risks and complications are dependent upon the sites that are lesioned, or the type of nerve block to be performed.  The closer the procedure is to the spine, the more serious the risks are.  Great care is taken when placing the radio frequency needles, block needles or lesioning probes, but sometimes complications can occur. 1. Infection: Any time there is an injection through the skin, there is a risk of infection.  This is why sterile conditions are used for these blocks.  There are four possible types of infection. 1. Localized skin infection. 2. Central Nervous System Infection-This can be in the form of Meningitis, which can be deadly. 3. Epidural Infections-This can be in the form of an epidural  abscess, which can cause pressure inside of the spine, causing compression of the spinal cord with subsequent paralysis. This would require an emergency surgery to decompress, and there are no guarantees that the patient would recover from the paralysis. 4. Discitis-This is an infection of the intervertebral discs.  It occurs in about 1% of discography procedures.  It is difficult to treat and it may lead to surgery.        2. Pain: the needles have to go through skin and soft tissues, will cause soreness.       3. Damage to internal structures:  The nerves to be lesioned may be near blood vessels or    other nerves which can be potentially damaged.       4. Bleeding: Bleeding is more common if the patient is taking blood thinners such as  aspirin, Coumadin, Ticiid, Plavix, etc., or if he/she have some genetic predisposition  such as hemophilia. Bleeding into the spinal canal can cause compression of the spinal  cord with subsequent paralysis.  This would require an emergency surgery to  decompress and there are no guarantees that the patient would recover from the  paralysis.       5. Pneumothorax:  Puncturing of a lung is a possibility, every time a needle is introduced in  the area of the chest or upper back.  Pneumothorax refers to free air around the  collapsed lung(s), inside of the thoracic cavity (chest cavity).  Another two possible  complications related to a similar event  would include: Hemothorax and Chylothorax.   These are variations of the Pneumothorax, where instead of air around the collapsed  lung(s), you may have blood or chyle, respectively.       6. Spinal headaches: They may occur with any procedures in the area of the spine.       7. Persistent CSF (Cerebro-Spinal Fluid) leakage: This is a rare problem, but may occur  with prolonged intrathecal or epidural catheters either due to the formation of a fistulous  track or a dural tear.       8. Nerve damage: By working so close to the  spinal cord, there is always a possibility of  nerve damage, which could be as serious as a permanent spinal cord injury with  paralysis.       9. Death:  Although rare, severe deadly allergic reactions known as "Anaphylactic  reaction" can occur to any of the medications used.      10. Worsening of the symptoms:  We can always make thing worse.  What are the chances of something like this happening? Chances of any of this occuring are extremely low.  By statistics, you have more of a chance of getting killed in a motor vehicle accident: while driving to the hospital than any of the above occurring .  Nevertheless, you should be aware that they are possibilities.  In general, it is similar to taking a shower.  Everybody knows that you can slip, hit your head and get killed.  Does that mean that you should not shower again?  Nevertheless always keep in mind that statistics do not mean anything if you happen to be on the wrong side of them.  Even if a procedure has a 1 (one) in a 1,000,000 (million) chance of going wrong, it you happen to be that one..Also, keep in mind that by statistics, you have more of a chance of having something go wrong when taking medications.  Who should not have this procedure? If you are on a blood thinning medication (e.g. Coumadin, Plavix, see list of "Blood Thinners"), or if you have an active infection going on, you should not have the procedure.  If you are taking any blood thinners, please inform your physician.  How should I prepare for this procedure?  Do not eat or drink anything at least six hours prior to the procedure.  Bring a driver with you .  It cannot be a taxi.  Come accompanied by an adult that can drive you back, and that is strong enough to help you if your legs get weak or numb from the local anesthetic.  Take all of your medicines the morning of the procedure with just enough water to swallow them.  If you have diabetes, make sure that you are  scheduled to have your procedure done first thing in the morning, whenever possible.  If you have diabetes, take only half of your insulin dose and notify our nurse that you have done so as soon as you arrive at the clinic.  If you are diabetic, but only take blood sugar pills (oral hypoglycemic), then do not take them on the morning of your procedure.  You may take them after you have had the procedure.  Do not take aspirin or any aspirin-containing medications, at least eleven (11) days prior to the procedure.  They may prolong bleeding.  Wear loose fitting clothing that may be easy to take off and that you would not mind if it  got stained with Betadine or blood.  Do not wear any jewelry or perfume  Remove any nail coloring.  It will interfere with some of our monitoring equipment.  NOTE: Remember that this is not meant to be interpreted as a complete list of all possible complications.  Unforeseen problems may occur.  BLOOD THINNERS The following drugs contain aspirin or other products, which can cause increased bleeding during surgery and should not be taken for 2 weeks prior to and 1 week after surgery.  If you should need take something for relief of minor pain, you may take acetaminophen which is found in Tylenol,m Datril, Anacin-3 and Panadol. It is not blood thinner. The products listed below are.  Do not take any of the products listed below in addition to any listed on your instruction sheet.  A.P.C or A.P.C with Codeine Codeine Phosphate Capsules #3 Ibuprofen Ridaura  ABC compound Congesprin Imuran rimadil  Advil Cope Indocin Robaxisal  Alka-Seltzer Effervescent Pain Reliever and Antacid Coricidin or Coricidin-D  Indomethacin Rufen  Alka-Seltzer plus Cold Medicine Cosprin Ketoprofen S-A-C Tablets  Anacin Analgesic Tablets or Capsules Coumadin Korlgesic Salflex  Anacin Extra Strength Analgesic tablets or capsules CP-2 Tablets Lanoril Salicylate  Anaprox Cuprimine Capsules  Levenox Salocol  Anexsia-D Dalteparin Magan Salsalate  Anodynos Darvon compound Magnesium Salicylate Sine-off  Ansaid Dasin Capsules Magsal Sodium Salicylate  Anturane Depen Capsules Marnal Soma  APF Arthritis pain formula Dewitt's Pills Measurin Stanback  Argesic Dia-Gesic Meclofenamic Sulfinpyrazone  Arthritis Bayer Timed Release Aspirin Diclofenac Meclomen Sulindac  Arthritis pain formula Anacin Dicumarol Medipren Supac  Analgesic (Safety coated) Arthralgen Diffunasal Mefanamic Suprofen  Arthritis Strength Bufferin Dihydrocodeine Mepro Compound Suprol  Arthropan liquid Dopirydamole Methcarbomol with Aspirin Synalgos  ASA tablets/Enseals Disalcid Micrainin Tagament  Ascriptin Doan's Midol Talwin  Ascriptin A/D Dolene Mobidin Tanderil  Ascriptin Extra Strength Dolobid Moblgesic Ticlid  Ascriptin with Codeine Doloprin or Doloprin with Codeine Momentum Tolectin  Asperbuf Duoprin Mono-gesic Trendar  Aspergum Duradyne Motrin or Motrin IB Triminicin  Aspirin plain, buffered or enteric coated Durasal Myochrisine Trigesic  Aspirin Suppositories Easprin Nalfon Trillsate  Aspirin with Codeine Ecotrin Regular or Extra Strength Naprosyn Uracel  Atromid-S Efficin Naproxen Ursinus  Auranofin Capsules Elmiron Neocylate Vanquish  Axotal Emagrin Norgesic Verin  Azathioprine Empirin or Empirin with Codeine Normiflo Vitamin E  Azolid Emprazil Nuprin Voltaren  Bayer Aspirin plain, buffered or children's or timed BC Tablets or powders Encaprin Orgaran Warfarin Sodium  Buff-a-Comp Enoxaparin Orudis Zorpin  Buff-a-Comp with Codeine Equegesic Os-Cal-Gesic   Buffaprin Excedrin plain, buffered or Extra Strength Oxalid   Bufferin Arthritis Strength Feldene Oxphenbutazone   Bufferin plain or Extra Strength Feldene Capsules Oxycodone with Aspirin   Bufferin with Codeine Fenoprofen Fenoprofen Pabalate or Pabalate-SF   Buffets II Flogesic Panagesic   Buffinol plain or Extra Strength Florinal or Florinal with  Codeine Panwarfarin   Buf-Tabs Flurbiprofen Penicillamine   Butalbital Compound Four-way cold tablets Penicillin   Butazolidin Fragmin Pepto-Bismol   Carbenicillin Geminisyn Percodan   Carna Arthritis Reliever Geopen Persantine   Carprofen Gold's salt Persistin   Chloramphenicol Goody's Phenylbutazone   Chloromycetin Haltrain Piroxlcam   Clmetidine heparin Plaquenil   Cllnoril Hyco-pap Ponstel   Clofibrate Hydroxy chloroquine Propoxyphen         Before stopping any of these medications, be sure to consult the physician who ordered them.  Some, such as Coumadin (Warfarin) are ordered to prevent or treat serious conditions such as "deep thrombosis", "pumonary embolisms", and other heart problems.  The amount of time  that you may need off of the medication may also vary with the medication and the reason for which you were taking it.  If you are taking any of these medications, please make sure you notify your pain physician before you undergo any procedures.          Trigger Point Injection Trigger points are areas where you have pain. A trigger point injection is a shot given in the trigger point to help relieve pain for a few days to a few months. Common places for trigger points include:  The neck.  The shoulders.  The upper back.  The lower back. A trigger point injection will not cure long-term (chronic) pain permanently. These injections do not always work for every person. For some people, they can help to relieve pain for a few days to a few months. Tell a health care provider about:  Any allergies you have.  All medicines you are taking, including vitamins, herbs, eye drops, creams, and over-the-counter medicines.  Any problems you or family members have had with anesthetic medicines.  Any blood disorders you have.  Any surgeries you have had.  Any medical conditions you have. What are the risks? Generally, this is a safe procedure. However, problems may occur,  including:  Infection.  Bleeding or bruising.  Allergic reaction to the injected medicine.  Irritation of the skin around the injection site. What happens before the procedure? Ask your health care provider about:  Changing or stopping your regular medicines. This is especially important if you are taking diabetes medicines or blood thinners.  Taking medicines such as aspirin and ibuprofen. These medicines can thin your blood. Do not take these medicines unless your health care provider tells you to take them.  Taking over-the-counter medicines, vitamins, herbs, and supplements. What happens during the procedure?   Your health care provider will feel for trigger points. A marker may be used to circle the area for the injection.  The skin over the trigger point will be washed with a germ-killing (antiseptic) solution.  A thin needle is used for the injection. You may feel pain or a twitching feeling when the needle enters the trigger point.  A numbing solution may be injected into the trigger point. Sometimes a medicine to keep down inflammation is also injected.  Your health care provider may move the needle around the area where the trigger point is located until the tightness and twitching goes away.  After the injection, your health care provider may put gentle pressure over the injection site.  The injection site will be covered with a bandage (dressing). The procedure may vary among health care providers and hospitals. What can I expect after treatment? After treatment, you may have:  Soreness and stiffness for 1-2 days.  A dressing. This can be taken off in a few hours or as told by your health care provider. Follow these instructions at home: Injection site care  Remove your dressing as told by your health care provider.  Check your injection site every day for signs of infection. Check for: ? Redness, swelling, or pain. ? Fluid or blood. ? Warmth. ? Pus or a  bad smell. Managing pain, stiffness, and swelling  If directed, put ice on the affected area. ? Put ice in a plastic bag. ? Place a towel between your skin and the bag. ? Leave the ice on for 20 minutes, 2-3 times a day. General instructions  If you were asked to stop your regular  medicines, ask your health care provider when you may start taking them again.  Return to your normal activities as told by your health care provider. Ask your health care provider what activities are safe for you.  Do not take baths, swim, or use a hot tub until your health care provider approves.  You may be asked to see an occupational or physical therapist for exercises that reduce muscle strain and stretch the area of the trigger point.  Keep all follow-up visits as told by your health care provider. This is important. Contact a health care provider if:  Your pain comes back, and it is worse than before the injection. You may need more injections.  You have chills or a fever.  The injection site becomes more painful, red, swollen, or warm to the touch. Summary  A trigger point injection is a shot given in the trigger point to help relieve pain for a few days to a few months.  Common places for trigger point injections are the neck, shoulder, upper back, and lower back.  These injections do not always work for every person, but for some people, the injections can help to relieve pain for a few days to a few months.  Contact a health care provider if symptoms come back or they are worse than before treatment. Also, get help if the injection site becomes more painful, red, swollen, or warm to the touch. This information is not intended to replace advice given to you by your health care provider. Make sure you discuss any questions you have with your health care provider. Document Revised: 02/18/2018 Document Reviewed: 02/18/2018 [employer] Patient Education  [place].

## 2019-09-06 NOTE — Progress Notes (Signed)
PROVIDER NOTE: Information contained herein reflects review and annotations entered in association with encounter. Interpretation of such information and data should be left to medically-trained personnel. Information provided to patient can be located elsewhere in the medical record under "Patient Instructions". Document created using STT-dictation technology, any transcriptional errors that may result from process are unintentional.    Patient: Tanya [name]  Service Category: E/M  Provider: Gaspar Cola, MD  DOB: Jan 14, 1961  DOS: 09/06/2019  Specialty: Interventional Pain Management  MRN: [id number]  Setting: Ambulatory outpatient  PCP: Ricardo Jericho, NP  Type: Established Patient    Referring Provider: Ricardo Jericho*  Location: Office  Delivery: Face-to-face     HPI  Reason for encounter: Ms. JILLIANNA [name], a 59 y.o. year old female, is here today for evaluation and management of her Chronic pain syndrome [G89.4]. Ms. Probert primary complain today is Neck Pain (posterior) and Shoulder Pain (left) Last encounter: Practice (08/27/2019). My last encounter with her was on 08/27/2019. Pertinent problems: Tanya [name] has Chronic upper back pain (Secondary area of Pain) (Bilateral) (R>L); Chronic low back pain (Third area of Pain) (Bilateral) (R>L); Chronic neck pain (Primary Area of Pain) (Bilateral) (R>L); Fibromyalgia; Osteoarthritis; Chronic pain syndrome; Chronic upper extremity pain (Right); Chronic cervical radicular pain (Right); Chronic shoulder pain (Bilateral) (R>L); Chronic musculoskeletal pain; Cervical spondylosis; Cervical facet syndrome (Bilateral) (R>L); Shoulder radicular pain (Bilateral) (R>L); Spondylosis without myelopathy or radiculopathy, cervical region; Cervicalgia; Occipital headache (Left); Neurogenic pain; Cervico-occipital neuralgia (Left); Chronic elbow pain (Left); Osteoarthritis of AC (acromioclavicular) joint (Right); Chronic  acromioclavicular joint pain (Right); Chronic shoulder pain (Right); Epicondylitis elbow, medial (Left); 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 Pain Assessment: Severity of Chronic pain is reported as a 7 /10. Location: Neck Posterior/into bilateral shouders and back. Onset: More than a month ago. Quality: Burning, Aching. Timing: Constant. Modifying factor(s): rest, medication, procedures. Vitals:  height is 5' 3"  (1.6 m) and weight is 153 lb (69.4 kg). Her temporal temperature is 97.2 F (36.2 C) (abnormal). Her blood pressure is 141/88 (abnormal) and her pulse is 85. Her respiration is 15 and oxygen saturation is 99%.    The patient indicates doing well with the current medication regimen. No adverse reactions or side effects reported to the medications.  Today the patient was informed of the oxycodone shortage.  I explained to her that I could switch her to something different.  We looked at the hydrocodone/APAP 5/325 1 tablet p.o. 3 times daily, but she indicated that she does not want to take any Tylenol.  Unfortunately, there is no MS Contin regimen that would equal her 15 MME, unless we are to have her take it just once a day.  However, she does not want to do that.  The patient was given several choices and she indicated that she would be trying to switch her medicines to [facility] instead of getting them at [facility].  I warned the patient that should she not be able to get her supply of medication, I would not be sending another prescription to a different pharmacy.  She understood and accepted.  I have sent enough prescriptions of her oxycodone to last for 90 days and she should have enough to last until 01/05/2020.  I did look at the option of [place], but it was not covered by her insurance.  Transfer: gabapentin (Neurontin) 300 mg capsule, 1 capsule p.o. 3 times daily (  90/month)  (12/17/2019)  Physical exam today reveals decreased range of motion on the left shoulder with arthralgia, especially when crossing the arm over the chest.  Pharmacotherapy Assessment   Analgesic: Oxycodone IR 5 mg, 1 tab PO BID (10 mg/day of oxycodone) MME/day: 15 mg/day.   Monitoring: [place] PMP: PDMP reviewed during this encounter.       Pharmacotherapy: No side-effects or adverse reactions reported. Compliance: No problems identified. Effectiveness: Clinically acceptable.  No notes on file  UDS:  Summary  Date Value Ref Range Status  07/01/2019 Note  Final    Comment:    ==================================================================== [employer] Select 13 (MW) ==================================================================== Specimen Alert Note: Urinary creatinine is low; ability to detect some drugs may be compromised. Interpret results with caution. (Creatinine) ==================================================================== Test                             Result       Flag       Units  Drug Present and Declared for Prescription Verification   Oxycodone                      1113         EXPECTED   ng/mg creat   Oxymorphone                    773          EXPECTED   ng/mg creat   Noroxycodone                   1840         EXPECTED   ng/mg creat    Sources of oxycodone include scheduled prescription medications.    Oxymorphone and noroxycodone are expected metabolites of oxycodone.    Oxymorphone is also available as a scheduled prescription medication.  ==================================================================== Test                      Result    Flag   Units      Ref Range   Creatinine              15        LL     mg/dL      >=20 ==================================================================== Declared Medications:  The flagging and interpretation on this report are based on the  following declared medications.  Unexpected results may arise  from  inaccuracies in the declared medications.   **Note: The testing scope of this panel includes these medications:   Oxycodone (Roxicodone)   **Note: The testing scope of this panel does not include the  following reported medications:   Albuterol (Ventolin HFA)  Atorvastatin (Lipitor)  Cyclobenzaprine (Flexeril)  Doxycycline (Doryx)  Duloxetine (Cymbalta)  Gabapentin (Neurontin)  Mirtazapine (Remeron)  Pantoprazole (Protonix)  Pregabalin (Lyrica)  Triamcinolone (Kenalog) ==================================================================== For clinical consultation, please call [phone]. ====================================================================      ROS  Constitutional: Denies any fever or chills Gastrointestinal: No reported hemesis, hematochezia, vomiting, or acute GI distress Musculoskeletal: Denies any acute onset joint swelling, redness, loss of ROM, or weakness Neurological: No reported episodes of acute onset apraxia, aphasia, dysarthria, agnosia, amnesia, paralysis, loss of coordination, or loss of consciousness  Medication Review  DULoxetine, atorvastatin, baclofen, busPIRone, gabapentin, oxyCODONE, and triamcinolone cream  History Review  Allergy: Ms. Birenbaum is allergic to pregabalin, trazodone, bupropion, methocarbamol, and tramadol. Drug: Ms. Rispoli  reports no history of  drug use. Alcohol:  reports no history of alcohol use. Tobacco:  reports that she has quit smoking. Her smoking use included cigarettes. She has a 17.50 pack-year smoking history. She has never used smokeless tobacco. Social: Ms. Pederson  reports that she has quit smoking. Her smoking use included cigarettes. She has a 17.50 pack-year smoking history. She has never used smokeless tobacco. She reports that she does not drink alcohol and does not use drugs. Medical:  has a past medical history of Anxiety, Bladder infection, Chronic lower back pain, Chronic neck pain,  Collagen vascular disease ([place]), Depression, Diverticulitis, Hyperlipidemia, Lupus ([place]), and Overactive bladder. Surgical: Ms. Lupien  has a past surgical history that includes Abdominal hysterectomy; Colonoscopy; Colonoscopy with propofol (N/A, 07/03/2017); Tonsillectomy; Esophagogastroduodenoscopy (N/A, 10/21/2017); and Colonoscopy with propofol (N/A, 10/21/2017). Family: family history includes Cancer in her mother; Diabetes in her brother; Emphysema in her mother; Glaucoma in her father; Heart disease in her father; Hypertension in her mother; Stroke in her mother.  Laboratory Chemistry Profile   Renal Lab Results  Component Value Date   BUN 7 06/09/2019   CREATININE 0.72 06/09/2019   BCR 15 09/03/2017   GFRAA >60 06/09/2019   GFRNONAA >60 06/09/2019     Hepatic Lab Results  Component Value Date   AST 26 06/09/2019   ALT 20 06/09/2019   ALBUMIN 4.1 06/09/2019   ALKPHOS 29 (L) 06/09/2019   LIPASE 35 06/09/2019     Electrolytes Lab Results  Component Value Date   NA 135 06/09/2019   K 4.2 06/09/2019   CL 102 06/09/2019   CALCIUM 8.4 (L) 06/09/2019   MG 1.9 09/03/2017     Bone Lab Results  Component Value Date   25OHVITD1 25 (L) 09/03/2017   25OHVITD2 2.9 09/03/2017   25OHVITD3 22 09/03/2017     Inflammation (CRP: Acute Phase) (ESR: Chronic Phase) Lab Results  Component Value Date   CRP 2 09/03/2017   ESRSEDRATE 21 09/03/2017       Note: Above Lab results reviewed.  Recent Imaging Review  CT ABDOMEN PELVIS W CONTRAST CLINICAL DATA:  59 year old with abdominal distension, fever and black stools. Surgical history includes hysterectomy.  EXAM: CT ABDOMEN AND PELVIS WITH CONTRAST  TECHNIQUE: Multidetector CT imaging of the abdomen and pelvis was performed using the standard protocol following bolus administration of intravenous contrast.  CONTRAST:  128m OMNIPAQUE IOHEXOL 300 MG/ML IV. Oral contrast was also administered.  COMPARISON:   09/08/2016.  FINDINGS: Lower chest: Isolated peripheral ground-glass airspace opacity deep in the LEFT LOWER LOBE. Visualized lung bases otherwise clear. Heart size normal.  Hepatobiliary: Numerous hepatic cysts as noted previously, the largest measuring approximately 2 cm in the RIGHT lobe adjacent to the gallbladder. No suspicious hepatic parenchymal masses. Gallbladder normal in appearance without calcified gallstones. No biliary ductal dilation.  Pancreas: Normal in appearance without evidence of mass, ductal dilation, or inflammation.  Spleen: Normal in size and appearance.  Adrenals/Urinary Tract: Normal appearing adrenal glands. Subcentimeter cortical cysts in both kidneys. No significant focal parenchymal abnormality involving either kidney. No hydronephrosis. No urinary tract calculi. Normal appearing urinary bladder.  Stomach/Bowel: Stomach normal in appearance for the degree of distention. Normal-appearing small bowel. Very large stool burden throughout the colon. Extensive diffuse colonic diverticulosis without evidence of acute diverticulitis. Normal appendix in the RIGHT upper pelvis.  Vascular/Lymphatic: Severe aorto-iliofemoral atherosclerosis without evidence of aneurysm. Normal-appearing portal venous and systemic venous systems.  No pathologic lymphadenopathy.  Reproductive: Surgically absent uterus. Normal-appearing ovaries. No adnexal masses.  Other: None.  Musculoskeletal: Degenerative disc disease and spondylosis at L5-S1 with a central disc protrusion. Facet degenerative changes at L4-5 and L5-S1. No acute findings.  IMPRESSION: 1. No acute abnormalities involving the abdomen or pelvis. 2. Very large stool burden throughout the colon. 3. Extensive diffuse colonic diverticulosis without evidence of acute diverticulitis. 4. Isolated peripheral ground-glass airspace opacity deep in the LEFT LOWER LOBE, possibly indicating an infectious or  inflammatory process. 5. Aortic Atherosclerosis (ICD10-I70.0).  Electronically Signed   By: Evangeline Dakin M.D.   On: 06/09/2019 20:05 Note: Reviewed        Physical Exam  General appearance: Well nourished, well developed, and well hydrated. In no apparent acute distress Mental status: Alert, oriented x 3 (person, place, & time)       Respiratory: No evidence of acute respiratory distress Eyes: PERLA Vitals: BP (!) 141/88 (BP Location: Right Arm, Patient Position: Sitting, Cuff Size: Normal)    Pulse 85    Temp (!) 97.2 F (36.2 C) (Temporal)    Resp 15    Ht 5' 3"  (1.6 m)    Wt 153 lb (69.4 kg)    SpO2 99%    BMI 27.10 kg/m  BMI: Estimated body mass index is 27.1 kg/m as calculated from the following:   Height as of this encounter: 5' 3"  (1.6 m).   Weight as of this encounter: 153 lb (69.4 kg). Ideal: Ideal body weight: 52.4 kg (115 lb 8.3 oz) Adjusted ideal body weight: 59.2 kg (130 lb 8.2 oz)  Assessment   Status Diagnosis  Controlled Controlled Controlled 1. Chronic pain syndrome   2. Chronic shoulder pain (Left)   3. Osteoarthritis of shoulder (Left)   4. Fibromyalgia   5. Chronic musculoskeletal pain   6. Neurogenic pain      Updated Problems: Problem  Chronic shoulder pain (Left)  Osteoarthritis of shoulder (Left)    Plan of Care  Problem-specific:  No problem-specific Assessment & Plan notes found for this encounter.  Ms. SHARAINE DELANGE has a current medication list which includes the following long-term medication(s): baclofen, [START ON 10/21/2019] gabapentin, [START ON 10/07/2019] oxycodone, [START ON 11/06/2019] oxycodone, and [START ON 12/06/2019] oxycodone.  Pharmacotherapy (Medications Ordered): Meds ordered this encounter  Medications   gabapentin (NEURONTIN) 300 MG capsule    Sig: Take 1 capsule (300 mg total) by mouth 3 (three) times daily.    Dispense:  90 capsule    Refill:  2    Fill one day early if pharmacy is closed on scheduled  refill date. May substitute for generic if available.   oxyCODONE (OXY IR/ROXICODONE) 5 MG immediate release tablet    Sig: Take 1 tablet (5 mg total) by mouth 2 (two) times daily. Must last 30 days    Dispense:  60 tablet    Refill:  0    Chronic Pain: STOP Act (Not applicable) Fill 1 day early if closed on refill date. Do not fill until: 10/07/2019. To last until: 11/06/2019. Avoid benzodiazepines within 8 hours of opioids   oxyCODONE (OXY IR/ROXICODONE) 5 MG immediate release tablet    Sig: Take 1 tablet (5 mg total) by mouth 2 (two) times daily. Must last 30 days    Dispense:  60 tablet    Refill:  0    Chronic Pain: STOP Act (Not applicable) Fill 1 day early if closed on refill date. Do not fill until: 11/06/2019. To last until: 12/06/2019. Avoid benzodiazepines within 8 hours of opioids  oxyCODONE (OXY IR/ROXICODONE) 5 MG immediate release tablet    Sig: Take 1 tablet (5 mg total) by mouth 2 (two) times daily. Must last 30 days    Dispense:  60 tablet    Refill:  0    Chronic Pain: STOP Act (Not applicable) Fill 1 day early if closed on refill date. Do not fill until: 12/06/2019. To last until: 01/05/2020. Avoid benzodiazepines within 8 hours of opioids   baclofen (LIORESAL) 10 MG tablet    Sig: Take 1-2 tablets (10-20 mg total) by mouth at bedtime.    Dispense:  60 tablet    Refill:  2    Do not place this medication, or any other prescription from our practice, on "Automatic Refill". Patient may have prescription filled one day early if pharmacy is closed on scheduled refill date.   Orders:  Orders Placed This Encounter  Procedures   SHOULDER INJECTION    Standing Status:   Future    Standing Expiration Date:   12/07/2019    Scheduling Instructions:     Side: Left-sided     Sedation: With Sedation.     Timeframe: As soon as schedule allows    Order Specific Question:   Where will this procedure be performed?    Answer:   [facility]    Comments:   by Dr.  Fortunato Curling Shoulder Left    Standing Status:   Future    Standing Expiration Date:   10/07/2019    Order Specific Question:   Reason for Exam (SYMPTOM  OR DIAGNOSIS REQUIRED)    Answer:   Left shoulder pain    Order Specific Question:   Is the patient pregnant?    Answer:   No    Order Specific Question:   Preferred imaging location?    Answer:   [place] [facility]    Order Specific Question:   Call Results- Best Contact Number?    Answer:   [phone]    Order Specific Question:   Release to patient    Answer:   Immediate   Follow-up plan:   Return for Procedure (w/ sedation): (L) IA Shoulder inj..      Interventional management options:  Considering:   Diagnostic right suprascapular NB Possible right suprascapular nerve RFA Diagnostic bilateral lumbar facet block Possible bilateral lumbar facetRFA Possible left-sided occipital nerve RFA    Palliative PRN treatment(s):   Palliative right glenohumeral joint injection #3  Palliative right AC joint injection #4  Palliative left medial epicondyle elbow injection #2  Diagnostic left GONB #2  Palliative right CESI(Series #2) Palliative bilateral cervical facet block  Palliative right cervical facet RFA#3(Last done on: 10/23/17) Palliative left cervical facet RFA #2 (Last done on: 06/26/2016)       Recent Visits Date Type Provider Dept  06/16/19 Telemedicine Milinda Pointer, MD [facility]  Showing recent visits within past 90 days and meeting all other requirements Today's Visits Date Type Provider Dept  09/06/19 Office Visit Milinda Pointer, MD [facility]  Showing today's visits and meeting all other requirements Future Appointments No visits were found meeting these conditions. Showing future appointments within next 90 days and meeting all other requirements  I discussed the assessment and treatment plan with the patient. The patient was provided an opportunity to ask questions and  all were answered. The patient agreed with the plan and demonstrated an understanding of the instructions.  Patient advised to call back or seek an in-person evaluation  if the symptoms or condition worsens.  Duration of encounter: 30 minutes.  Note by: Gaspar Cola, MD Date: 09/06/2019; Time: 2:46 PM

## 2019-09-09 ENCOUNTER — Telehealth: Payer: Self-pay | Admitting: Pain Medicine

## 2019-09-09 NOTE — Telephone Encounter (Signed)
I have sent documentation for PA which is what was missing and the key paper is in the book.  Key [id number]  Case ID [id number].  ]  I have let the patient know the status of request and that I will let her know if I receive any word on approved/denial.

## 2019-09-09 NOTE — Telephone Encounter (Signed)
Called patient and let her know that it may take a little longer to get PA.  I did give her information if she would like to call to check on it along with Case ID.

## 2019-09-09 NOTE — Telephone Encounter (Signed)
Patient tried to pick her medications up but pharmacy says they do not have PA. Can you check on this and call patient please

## 2019-09-09 NOTE — Telephone Encounter (Signed)
Pt called and stated that she is at the pharmacy now and they are still saying her medication hasn't been approved and needs PA

## 2019-09-15 ENCOUNTER — Ambulatory Visit: Payer: Medicaid Other | Admitting: Pain Medicine

## 2019-11-22 ENCOUNTER — Other Ambulatory Visit: Payer: Self-pay | Admitting: Pain Medicine

## 2019-11-22 DIAGNOSIS — G8929 Other chronic pain: Secondary | ICD-10-CM

## 2019-11-22 DIAGNOSIS — M792 Neuralgia and neuritis, unspecified: Secondary | ICD-10-CM

## 2019-11-22 DIAGNOSIS — M7918 Myalgia, other site: Secondary | ICD-10-CM

## 2019-11-22 DIAGNOSIS — M797 Fibromyalgia: Secondary | ICD-10-CM

## 2019-12-22 ENCOUNTER — Encounter: Payer: Self-pay | Admitting: Pain Medicine

## 2019-12-22 ENCOUNTER — Other Ambulatory Visit: Payer: Self-pay

## 2019-12-22 ENCOUNTER — Ambulatory Visit: Payer: Medicaid Other | Attending: Pain Medicine | Admitting: Pain Medicine

## 2019-12-22 VITALS — BP 115/78 | HR 88 | Temp 97.3°F | Ht 63.0 in | Wt 149.0 lb

## 2019-12-22 DIAGNOSIS — G894 Chronic pain syndrome: Secondary | ICD-10-CM

## 2019-12-22 DIAGNOSIS — M797 Fibromyalgia: Secondary | ICD-10-CM

## 2019-12-22 DIAGNOSIS — M542 Cervicalgia: Secondary | ICD-10-CM

## 2019-12-22 DIAGNOSIS — M25511 Pain in right shoulder: Secondary | ICD-10-CM | POA: Insufficient documentation

## 2019-12-22 DIAGNOSIS — G8929 Other chronic pain: Secondary | ICD-10-CM | POA: Diagnosis present

## 2019-12-22 DIAGNOSIS — M792 Neuralgia and neuritis, unspecified: Secondary | ICD-10-CM | POA: Diagnosis present

## 2019-12-22 DIAGNOSIS — Z79899 Other long term (current) drug therapy: Secondary | ICD-10-CM

## 2019-12-22 DIAGNOSIS — M549 Dorsalgia, unspecified: Secondary | ICD-10-CM | POA: Insufficient documentation

## 2019-12-22 DIAGNOSIS — M545 Low back pain, unspecified: Secondary | ICD-10-CM | POA: Diagnosis present

## 2019-12-22 DIAGNOSIS — M19012 Primary osteoarthritis, left shoulder: Secondary | ICD-10-CM | POA: Diagnosis not present

## 2019-12-22 DIAGNOSIS — M7918 Myalgia, other site: Secondary | ICD-10-CM | POA: Diagnosis present

## 2019-12-22 DIAGNOSIS — M25512 Pain in left shoulder: Secondary | ICD-10-CM | POA: Diagnosis not present

## 2019-12-22 DIAGNOSIS — F1120 Opioid dependence, uncomplicated: Secondary | ICD-10-CM

## 2019-12-22 MED ORDER — OXYCODONE HCL 5 MG PO TABS: 5.0000 mg | ORAL_TABLET | Freq: Two times a day (BID) | ORAL | 0 refills | Status: DC

## 2019-12-22 MED ORDER — BACLOFEN 10 MG PO TABS: 10.0000 mg | ORAL_TABLET | Freq: Every day | ORAL | 2 refills | Status: DC

## 2019-12-22 MED ORDER — GABAPENTIN 300 MG PO CAPS: 300.0000 mg | ORAL_CAPSULE | Freq: Three times a day (TID) | ORAL | 2 refills | Status: DC

## 2019-12-22 NOTE — Progress Notes (Signed)
Nursing Pain Medication Assessment:  Safety precautions to be maintained throughout the outpatient stay will include: orient to surroundings, keep bed in low position, maintain call bell within reach at all times, provide assistance with transfer out of bed and ambulation.  Medication Inspection Compliance: Tanya [name] did not comply with our request to bring her pills to be counted. She was reminded that bringing the medication bottles, even when empty, is a requirement.  Medication: None brought in. Pill/Patch Count: None available to be counted. Bottle Appearance: No container available. Did not bring bottle(s) to appointment. Filled Date: N/A Last Medication intake:  Day before yesterdaySafety precautions to be maintained throughout the outpatient stay will include: orient to surroundings, keep bed in low position, maintain call bell within reach at all times, provide assistance with transfer out of bed and ambulation.

## 2019-12-22 NOTE — Progress Notes (Signed)
PROVIDER NOTE: Information contained herein reflects review and annotations entered in association with encounter. Interpretation of such information and data should be left to medically-trained personnel. Information provided to patient can be located elsewhere in the medical record under "Patient Instructions". Document created using STT-dictation technology, any transcriptional errors that may result from process are unintentional.    Patient: Tanya [name]  Service Category: E/M  Provider: Gaspar Cola, MD  DOB: 1960/11/28  DOS: 12/22/2019  Specialty: Interventional Pain Management  MRN: [id number]  Setting: Ambulatory outpatient  PCP: Henrietta Hoover, MD  Type: Established Patient    Referring Provider: Ricardo Jericho*  Location: Office  Delivery: Face-to-face     HPI  [name], a 59 y.o. year old female, is here today because of her Chronic pain syndrome [G89.4]. Ms. Nanni primary complain today is Shoulder Pain (left) Last encounter: My last encounter with her was on 11/22/2019. Pertinent problems: Ms. Capp has Chronic upper back pain (Secondary area of Pain) (Bilateral) (R>L); Chronic low back pain (Third area of Pain) (Bilateral) (R>L); Chronic neck pain (Primary Area of Pain) (Bilateral) (R>L); Fibromyalgia; Osteoarthritis; Chronic pain syndrome; Chronic upper extremity pain (Right); Chronic cervical radicular pain (Right); Chronic shoulder pain (Bilateral) (R>L); Chronic musculoskeletal pain; Cervical spondylosis; Cervical facet syndrome (Bilateral) (R>L); Shoulder radicular pain (Bilateral) (R>L); Spondylosis without myelopathy or radiculopathy, cervical region; Cervicalgia; Occipital headache (Left); Neurogenic pain; Cervico-occipital neuralgia (Left); Chronic elbow pain (Left); Osteoarthritis of AC (acromioclavicular) joint (Right); Chronic acromioclavicular joint pain (Right); Chronic shoulder pain (Right); Epicondylitis elbow, medial  (Left); 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 Pain Assessment: Severity of Chronic pain is reported as a 8 /10. Location: Shoulder Left/Denies, hard to reach. Onset: More than a month ago. Quality: Sharp, Aching, Constant. Timing: Constant. Modifying factor(s): resting  and medications. Vitals:  height is 5' 3"  (1.6 m) and weight is 149 lb (67.6 kg). Her temperature is 97.3 F (36.3 C) (abnormal). Her blood pressure is 115/78 and her pulse is 88. Her oxygen saturation is 99%.   Reason for encounter: medication management.  The patient indicates that she came in today thinking that she could have a left shoulder injection.  Apparently she has been having increased pain in the anterior aspect of the shoulder over the [facility] [place] joint as well and has restriction in range of motion for shoulder abduction and anterior rotation.  She is also experiencing some pain under the acromion and in the posterior aspect of the shoulder.  She also indicates having some weakness with shoulder abduction and anterior rotation.   The patient indicates doing well with the current medication regimen. No adverse reactions or side effects reported to the medications. PMP & UDS compliant.   Needs PCP address.  No routing information. RTCB: 04/04/2020 Nonopioids transfer 12/21/2020: Baclofen and Neurontin  Pharmacotherapy Assessment   Analgesic: Oxycodone IR 5 mg, 1 tab PO BID (10 mg/day of oxycodone) MME/day: 15 mg/day.   Monitoring: [place] PMP: PDMP reviewed during this encounter.       Pharmacotherapy: No side-effects or adverse reactions reported. Compliance: No problems identified. Effectiveness: Clinically acceptable.  Chauncey Fischer, RN  12/22/2019 11:04 AM  Sign when Signing Visit Nursing Pain Medication Assessment:  Safety precautions to be maintained throughout the outpatient stay will include: orient to  surroundings, keep bed in low position, maintain call bell within reach at all times, provide assistance with transfer out of  bed and ambulation.  Medication Inspection Compliance: Ms. Berkel did not comply with our request to bring her pills to be counted. She was reminded that bringing the medication bottles, even when empty, is a requirement.  Medication: None brought in. Pill/Patch Count: None available to be counted. Bottle Appearance: No container available. Did not bring bottle(s) to appointment. Filled Date: N/A Last Medication intake:  Day before yesterdaySafety precautions to be maintained throughout the outpatient stay will include: orient to surroundings, keep bed in low position, maintain call bell within reach at all times, provide assistance with transfer out of bed and ambulation.     UDS:  Summary  Date Value Ref Range Status  07/01/2019 Note  Final    Comment:    ==================================================================== [employer] [facility] (MW) ==================================================================== Specimen Alert Note: Urinary creatinine is low; ability to detect some drugs may be compromised. Interpret results with caution. (Creatinine) ==================================================================== Test                             Result       Flag       Units  Drug Present and Declared for Prescription Verification   Oxycodone                      1113         EXPECTED   ng/mg creat   Oxymorphone                    773          EXPECTED   ng/mg creat   Noroxycodone                   1840         EXPECTED   ng/mg creat    Sources of oxycodone include scheduled prescription medications.    Oxymorphone and noroxycodone are expected metabolites of oxycodone.    Oxymorphone is also available as a scheduled prescription medication.  ==================================================================== Test                      Result     Flag   Units      Ref Range   Creatinine              15        LL     mg/dL      >=20 ==================================================================== Declared Medications:  The flagging and interpretation on this report are based on the  following declared medications.  Unexpected results may arise from  inaccuracies in the declared medications.   **Note: The testing scope of this panel includes these medications:   Oxycodone (Roxicodone)   **Note: The testing scope of this panel does not include the  following reported medications:   Albuterol (Ventolin HFA)  Atorvastatin (Lipitor)  Cyclobenzaprine (Flexeril)  Doxycycline (Doryx)  Duloxetine (Cymbalta)  Gabapentin (Neurontin)  Mirtazapine (Remeron)  Pantoprazole (Protonix)  Pregabalin (Lyrica)  Triamcinolone (Kenalog) ==================================================================== For clinical consultation, please call [phone]. ====================================================================      ROS  Constitutional: Denies any fever or chills Gastrointestinal: No reported hemesis, hematochezia, vomiting, or acute GI distress Musculoskeletal: Denies any acute onset joint swelling, redness, loss of ROM, or weakness Neurological: No reported episodes of acute onset apraxia, aphasia, dysarthria, agnosia, amnesia, paralysis, loss of coordination, or loss of consciousness  Medication Review  DULoxetine, baclofen, busPIRone, gabapentin, oxyCODONE, and triamcinolone  History Review  Allergy: Ms. Buonocore is allergic to pregabalin, trazodone, bupropion, methocarbamol, and tramadol. Drug: Ms. Steinberg  reports no history of drug use. Alcohol:  reports no history of alcohol use. Tobacco:  reports that she has quit smoking. Her smoking use included cigarettes. She has a 17.50 pack-year smoking history. She has never used smokeless tobacco. Social: Ms. Schwanz  reports that she has quit smoking. Her  smoking use included cigarettes. She has a 17.50 pack-year smoking history. She has never used smokeless tobacco. She reports that she does not drink alcohol and does not use drugs. Medical:  has a past medical history of Anxiety, Bladder infection, Chronic lower back pain, Chronic neck pain, Collagen vascular disease ([place]), Depression, Diverticulitis, Hyperlipidemia, Lupus ([place]), and Overactive bladder. Surgical: Ms. Duquette  has a past surgical history that includes Abdominal hysterectomy; Colonoscopy; Colonoscopy with propofol (N/A, 07/03/2017); Tonsillectomy; Esophagogastroduodenoscopy (N/A, 10/21/2017); and Colonoscopy with propofol (N/A, 10/21/2017). Family: family history includes Cancer in her mother; Diabetes in her brother; Emphysema in her mother; Glaucoma in her father; Heart disease in her father; Hypertension in her mother; Stroke in her mother.  Laboratory Chemistry Profile   Renal Lab Results  Component Value Date   BUN 7 06/09/2019   CREATININE 0.72 06/09/2019   BCR 15 09/03/2017   GFRAA >60 06/09/2019   GFRNONAA >60 06/09/2019     Hepatic Lab Results  Component Value Date   AST 26 06/09/2019   ALT 20 06/09/2019   ALBUMIN 4.1 06/09/2019   ALKPHOS 29 (L) 06/09/2019   LIPASE 35 06/09/2019     Electrolytes Lab Results  Component Value Date   NA 135 06/09/2019   K 4.2 06/09/2019   CL 102 06/09/2019   CALCIUM 8.4 (L) 06/09/2019   MG 1.9 09/03/2017     Bone Lab Results  Component Value Date   25OHVITD1 25 (L) 09/03/2017   25OHVITD2 2.9 09/03/2017   25OHVITD3 22 09/03/2017     Inflammation (CRP: Acute Phase) (ESR: Chronic Phase) Lab Results  Component Value Date   CRP 2 09/03/2017   ESRSEDRATE 21 09/03/2017       Note: Above Lab results reviewed.  Recent Imaging Review  CT ABDOMEN PELVIS W CONTRAST CLINICAL DATA:  59 year old with abdominal distension, fever and black stools. Surgical history includes hysterectomy.  EXAM: CT ABDOMEN AND PELVIS WITH  CONTRAST  TECHNIQUE: Multidetector CT imaging of the abdomen and pelvis was performed using the standard protocol following bolus administration of intravenous contrast.  CONTRAST:  141m OMNIPAQUE IOHEXOL 300 MG/ML IV. Oral contrast was also administered.  COMPARISON:  09/08/2016.  FINDINGS: Lower chest: Isolated peripheral ground-glass airspace opacity deep in the LEFT LOWER LOBE. Visualized lung bases otherwise clear. Heart size normal.  Hepatobiliary: Numerous hepatic cysts as noted previously, the largest measuring approximately 2 cm in the RIGHT lobe adjacent to the gallbladder. No suspicious hepatic parenchymal masses. Gallbladder normal in appearance without calcified gallstones. No biliary ductal dilation.  Pancreas: Normal in appearance without evidence of mass, ductal dilation, or inflammation.  Spleen: Normal in size and appearance.  Adrenals/Urinary Tract: Normal appearing adrenal glands. Subcentimeter cortical cysts in both kidneys. No significant focal parenchymal abnormality involving either kidney. No hydronephrosis. No urinary tract calculi. Normal appearing urinary bladder.  Stomach/Bowel: Stomach normal in appearance for the degree of distention. Normal-appearing small bowel. Very large stool burden throughout the colon. Extensive diffuse colonic diverticulosis without evidence of acute diverticulitis. Normal appendix in the RIGHT upper pelvis.  Vascular/Lymphatic: Severe aorto-iliofemoral atherosclerosis without evidence  of aneurysm. Normal-appearing portal venous and systemic venous systems.  No pathologic lymphadenopathy.  Reproductive: Surgically absent uterus. Normal-appearing ovaries. No adnexal masses.  Other: None.  Musculoskeletal: Degenerative disc disease and spondylosis at L5-S1 with a central disc protrusion. Facet degenerative changes at L4-5 and L5-S1. No acute findings.  IMPRESSION: 1. No acute abnormalities involving the  abdomen or pelvis. 2. Very large stool burden throughout the colon. 3. Extensive diffuse colonic diverticulosis without evidence of acute diverticulitis. 4. Isolated peripheral ground-glass airspace opacity deep in the LEFT LOWER LOBE, possibly indicating an infectious or inflammatory process. 5. Aortic Atherosclerosis (ICD10-I70.0).  Electronically Signed   By: Evangeline Dakin M.D.   On: 06/09/2019 20:05 Note: Reviewed        Physical Exam  General appearance: Well nourished, well developed, and well hydrated. In no apparent acute distress Mental status: Alert, oriented x 3 (person, place, & time)       Respiratory: No evidence of acute respiratory distress Eyes: PERLA Vitals: BP 115/78   Pulse 88   Temp (!) 97.3 F (36.3 C)   Ht 5' 3"  (1.6 m)   Wt 149 lb (67.6 kg)   SpO2 99%   BMI 26.39 kg/m  BMI: Estimated body mass index is 26.39 kg/m as calculated from the following:   Height as of this encounter: 5' 3"  (1.6 m).   Weight as of this encounter: 149 lb (67.6 kg). Ideal: Ideal body weight: 52.4 kg (115 lb 8.3 oz) Adjusted ideal body weight: 58.5 kg (128 lb 14.6 oz)  Assessment   Status Diagnosis  Controlled Worsened Stable 1. Chronic pain syndrome   2. Chronic shoulder pain (Left)   3. Osteoarthritis of shoulder (Left)   4. Chronic shoulder pain (Bilateral) (R>L)   5. Chronic neck pain (Primary Area of Pain) (Bilateral) (R>L)   6. Chronic upper back pain (Secondary area of Pain) (Bilateral) (R>L)   7. Chronic low back pain (Third area of Pain) (Bilateral) (R>L)   8. Fibromyalgia   9. Pharmacologic therapy   10. Uncomplicated opioid dependence ([place])   11. Chronic musculoskeletal pain   12. Neurogenic pain      Updated Problems: No problems updated.  Plan of Care  Problem-specific:  No problem-specific Assessment & Plan notes found for this encounter.  Ms. KAILA DEVRIES has a current medication list which includes the following long-term  medication(s): baclofen, gabapentin, [START ON 01/05/2020] oxycodone, [START ON 02/04/2020] oxycodone, and [START ON 03/05/2020] oxycodone.  Pharmacotherapy (Medications Ordered): Meds ordered this encounter  Medications  . baclofen (LIORESAL) 10 MG tablet    Sig: Take 1-2 tablets (10-20 mg total) by mouth at bedtime.    Dispense:  60 tablet    Refill:  2    Fill one day early if pharmacy is closed on scheduled refill date. Generic permitted. Do not send renewal requests. Void any older duplicate prescription or refill(s) that may be on file.  . gabapentin (NEURONTIN) 300 MG capsule    Sig: Take 1 capsule (300 mg total) by mouth 3 (three) times daily.    Dispense:  90 capsule    Refill:  2    Fill one day early if pharmacy is closed on scheduled refill date. Generic permitted. Do not send renewal requests. Void any older duplicate prescription or refill(s) that may be on file.  Marland Kitchen oxyCODONE (OXY IR/ROXICODONE) 5 MG immediate release tablet    Sig: Take 1 tablet (5 mg total) by mouth 2 (two) times daily. Must last 30  days    Dispense:  60 tablet    Refill:  0    Chronic Pain: STOP Act (Not applicable) Fill 1 day early if closed on refill date. Avoid benzodiazepines within 8 hours of opioids  . oxyCODONE (OXY IR/ROXICODONE) 5 MG immediate release tablet    Sig: Take 1 tablet (5 mg total) by mouth 2 (two) times daily. Must last 30 days    Dispense:  60 tablet    Refill:  0    Chronic Pain: STOP Act (Not applicable) Fill 1 day early if closed on refill date. Avoid benzodiazepines within 8 hours of opioids  . oxyCODONE (OXY IR/ROXICODONE) 5 MG immediate release tablet    Sig: Take 1 tablet (5 mg total) by mouth 2 (two) times daily. Must last 30 days    Dispense:  60 tablet    Refill:  0    Chronic Pain: STOP Act (Not applicable) Fill 1 day early if closed on refill date. Avoid benzodiazepines within 8 hours of opioids   Orders:  Orders Placed This Encounter  Procedures  . SHOULDER INJECTION     Standing Status:   Future    Standing Expiration Date:   02/22/2020    Scheduling Instructions:     Procedure: Intra-articular shoulder (Glenohumeral) joint and (AC) Acromioclavicular joint injection     Side: Left-sided     Level: Glenohumeral joint and (AC) Acromioclavicular joint     Sedation: Patient's choice.     Timeframe: Tomorrow    Order Specific Question:   Where will this procedure be performed?    Answer:   [facility]   Follow-up plan:   Return in about 3 months (around 04/04/2020) for (F2F), (Med Mgmt), in addition, Procedure (w/ sedation): (L) IA Shoulder inj..      Interventional management options:  Considering:   Diagnostic right suprascapular NB Possible right suprascapular nerve RFA Diagnostic bilateral lumbar facet block Possible bilateral lumbar facetRFA Possible left-sided occipital nerve RFA    Palliative PRN treatment(s):   Palliative right glenohumeral joint injection #3  Palliative right AC joint injection #4  Palliative left medial epicondyle elbow injection #2  Diagnostic left GONB #2  Palliative right CESI(Series #2) Palliative bilateral cervical facet block  Palliative right cervical facet RFA#3(Last done on: 10/23/17) Palliative left cervical facet RFA #2 (Last done on: 06/26/2016)        Recent Visits No visits were found meeting these conditions. Showing recent visits within past 90 days and meeting all other requirements Today's Visits Date Type Provider Dept  12/22/19 Office Visit Milinda Pointer, MD [facility]  Showing today's visits and meeting all other requirements Future Appointments No visits were found meeting these conditions. Showing future appointments within next 90 days and meeting all other requirements  I discussed the assessment and treatment plan with the patient. The patient was provided an opportunity to ask questions and all were answered. The patient agreed with the plan and demonstrated  an understanding of the instructions.  Patient advised to call back or seek an in-person evaluation if the symptoms or condition worsens.  Duration of encounter: 90 minutes.  Note by: Gaspar Cola, MD Date: 12/22/2019; Time: 11:18 AM

## 2019-12-22 NOTE — Patient Instructions (Signed)
____________________________________________________________________________________________  Preparing for Procedure with Sedation  Procedure appointments are limited to planned procedures: . No Prescription Refills. . No disability issues will be discussed. . No medication changes will be discussed.  Instructions: . Oral Intake: Do not eat or drink anything for at least 8 hours prior to your procedure. (Exception: Blood Pressure Medication. See below.) . Transportation: Unless otherwise stated by your physician, you may drive yourself after the procedure. . Blood Pressure Medicine: Do not forget to take your blood pressure medicine with a sip of water the morning of the procedure. If your Diastolic (lower reading)is above 100 mmHg, elective cases will be cancelled/rescheduled. . Blood thinners: These will need to be stopped for procedures. Notify our staff if you are taking any blood thinners. Depending on which one you take, there will be specific instructions on how and when to stop it. . Diabetics on insulin: Notify the staff so that you can be scheduled 1st case in the morning. If your diabetes requires high dose insulin, take only  of your normal insulin dose the morning of the procedure and notify the staff that you have done so. . Preventing infections: Shower with an antibacterial soap the morning of your procedure. . Build-up your immune system: Take 1000 mg of Vitamin C with every meal (3 times a day) the day prior to your procedure. . Antibiotics: Inform the staff if you have a condition or reason that requires you to take antibiotics before dental procedures. . Pregnancy: If you are pregnant, call and cancel the procedure. . Sickness: If you have a cold, fever, or any active infections, call and cancel the procedure. . Arrival: You must be in the facility at least 30 minutes prior to your scheduled procedure. . Children: Do not bring children with you. . Dress appropriately:  Bring dark clothing that you would not mind if they get stained. . Valuables: Do not bring any jewelry or valuables.  Reasons to call and reschedule or cancel your procedure: (Following these recommendations will minimize the risk of a serious complication.) . Surgeries: Avoid having procedures within 2 weeks of any surgery. (Avoid for 2 weeks before or after any surgery). . Flu Shots: Avoid having procedures within 2 weeks of a flu shots or . (Avoid for 2 weeks before or after immunizations). . Barium: Avoid having a procedure within 7-10 days after having had a radiological study involving the use of radiological contrast. (Myelograms, Barium swallow or enema study). . Heart attacks: Avoid any elective procedures or surgeries for the initial 6 months after a "Myocardial Infarction" (Heart Attack). . Blood thinners: It is imperative that you stop these medications before procedures. Let us know if you if you take any blood thinner.  . Infection: Avoid procedures during or within two weeks of an infection (including chest colds or gastrointestinal problems). Symptoms associated with infections include: Localized redness, fever, chills, night sweats or profuse sweating, burning sensation when voiding, cough, congestion, stuffiness, runny nose, sore throat, diarrhea, nausea, vomiting, cold or Flu symptoms, recent or current infections. It is specially important if the infection is over the area that we intend to treat. . Heart and lung problems: Symptoms that may suggest an active cardiopulmonary problem include: cough, chest pain, breathing difficulties or shortness of breath, dizziness, ankle swelling, uncontrolled high or unusually low blood pressure, and/or palpitations. If you are experiencing any of these symptoms, cancel your procedure and contact your primary care physician for an evaluation.  Remember:  Regular Business hours are:    Monday to Thursday 8:00 AM to 4:00 PM  Provider's  Schedule: Marijean Montanye, MD:  Procedure days: Tuesday and Thursday 7:30 AM to 4:00 PM  Bilal Lateef, MD:  Procedure days: Monday and Wednesday 7:30 AM to 4:00 PM ____________________________________________________________________________________________    

## 2019-12-30 ENCOUNTER — Other Ambulatory Visit: Payer: Self-pay

## 2019-12-30 ENCOUNTER — Encounter: Payer: Self-pay | Admitting: Pain Medicine

## 2019-12-30 ENCOUNTER — Ambulatory Visit (HOSPITAL_BASED_OUTPATIENT_CLINIC_OR_DEPARTMENT_OTHER): Payer: Medicaid Other | Admitting: Pain Medicine

## 2019-12-30 ENCOUNTER — Ambulatory Visit
Admission: RE | Admit: 2019-12-30 | Discharge: 2019-12-30 | Disposition: A | Discharge status: Home / Self Care | Payer: Medicaid Other | Source: Ambulatory Visit | Attending: Pain Medicine | Admitting: Pain Medicine

## 2019-12-30 VITALS — BP 128/74 | HR 83 | Temp 97.2°F | Resp 12 | Ht 63.0 in | Wt 151.0 lb

## 2019-12-30 DIAGNOSIS — M25512 Pain in left shoulder: Secondary | ICD-10-CM | POA: Diagnosis not present

## 2019-12-30 DIAGNOSIS — G8929 Other chronic pain: Secondary | ICD-10-CM

## 2019-12-30 DIAGNOSIS — M19012 Primary osteoarthritis, left shoulder: Secondary | ICD-10-CM

## 2019-12-30 MED ORDER — METHYLPREDNISOLONE ACETATE 80 MG/ML IJ SUSP
80.0000 mg | Freq: Once | INTRAMUSCULAR | Status: AC
  Administered 2019-12-30: 80 mg via INTRA_ARTICULAR
  Filled 2019-12-30: qty 1

## 2019-12-30 MED ORDER — MIDAZOLAM HCL 5 MG/5ML IJ SOLN
INTRAMUSCULAR | Status: AC
  Filled 2019-12-30: qty 5

## 2019-12-30 MED ORDER — LACTATED RINGERS IV SOLN
1000.0000 mL | Freq: Once | INTRAVENOUS | Status: AC
  Administered 2019-12-30: 1000 mL via INTRAVENOUS

## 2019-12-30 MED ORDER — FENTANYL CITRATE (PF) 100 MCG/2ML IJ SOLN
INTRAMUSCULAR | Status: AC
  Filled 2019-12-30: qty 2

## 2019-12-30 MED ORDER — MIDAZOLAM HCL 5 MG/5ML IJ SOLN
1.0000 mg | INTRAMUSCULAR | Status: DC | PRN
  Administered 2019-12-30: 2 mg via INTRAVENOUS

## 2019-12-30 MED ORDER — LIDOCAINE HCL 2 % IJ SOLN
20.0000 mL | Freq: Once | INTRAMUSCULAR | Status: AC
  Administered 2019-12-30: 400 mg

## 2019-12-30 MED ORDER — ROPIVACAINE HCL 2 MG/ML IJ SOLN
9.0000 mL | Freq: Once | INTRAMUSCULAR | Status: AC
  Administered 2019-12-30: 9 mL via INTRA_ARTICULAR
  Filled 2019-12-30: qty 10

## 2019-12-30 MED ORDER — FENTANYL CITRATE (PF) 100 MCG/2ML IJ SOLN
25.0000 ug | INTRAMUSCULAR | Status: DC | PRN
  Administered 2019-12-30: 50 ug via INTRAVENOUS

## 2019-12-30 NOTE — Progress Notes (Signed)
Safety precautions to be maintained throughout the outpatient stay will include: orient to surroundings, keep bed in low position, maintain call bell within reach at all times, provide assistance with transfer out of bed and ambulation.  

## 2019-12-30 NOTE — Patient Instructions (Signed)

## 2019-12-30 NOTE — Progress Notes (Signed)
PROVIDER NOTE: Information contained herein reflects review and annotations entered in association with encounter. Interpretation of such information and data should be left to medically-trained personnel. Information provided to patient can be located elsewhere in the medical record under "Patient Instructions". Document created using STT-dictation technology, any transcriptional errors that may result from process are unintentional.    Patient: Tanya [name]  Service Category: Procedure  Provider: Gaspar Cola, MD  DOB: [birth year]  DOS: 12/30/2019  Location: [facility]  MRN: [id number]  Setting: Ambulatory - outpatient  Referring Provider: [name]*  Type: Established Patient  Specialty: Interventional Pain Management  PCP: Henrietta Hoover, MD   Primary Reason for Visit: Interventional Pain Management Treatment. CC: Shoulder Pain (left)  Procedure:          Anesthesia, Analgesia, Anxiolysis:  Type: Diagnostic Glenohumeral and acromioclavicular joint Injection #1  Primary Purpose: Diagnostic Region: Superior Shoulder Area Level:  Shoulder Target Area: Glenohumeral and acromioclavicular joint Approach: Anterior approach. Laterality: Left  Type: Moderate (Conscious) Sedation combined with Local Anesthesia Indication(s): Analgesia and Anxiety Route: Intravenous (IV) IV Access: Secured Sedation: Meaningful verbal contact was maintained at all times during the procedure  Local Anesthetic: Lidocaine 1-2%  Position: Supine   Indications: 1. Chronic shoulder pain (Left)   2. Osteoarthritis of shoulder (Left)    Pain Score: Pre-procedure: 6 /10 Post-procedure: (P) 0-No pain/10   Pre-op H&P Assessment:  Tanya [name] is a 59 y.o. (year old), female patient, seen today for interventional treatment. She  has a past surgical history that includes Abdominal hysterectomy; Colonoscopy; Colonoscopy with propofol (N/A, 07/03/2017);  Tonsillectomy; Esophagogastroduodenoscopy (N/A, 10/21/2017); and Colonoscopy with propofol (N/A, 10/21/2017). Tanya [name] has a current medication list which includes the following prescription(s): atorvastatin, baclofen, buspirone, duloxetine, gabapentin, hydroxychloroquine, [START ON 01/05/2020] oxycodone, [START ON 02/04/2020] oxycodone, [START ON 03/05/2020] oxycodone, nicotine step 1, and triamcinolone, and the following Facility-Administered Medications: fentanyl and midazolam. Her primarily concern today is the Shoulder Pain (left)  Initial Vital Signs:  Pulse/HCG Rate: 83ECG Heart Rate: 69 Temp: (!) 96.8 F (36 C) Resp: 18 BP: 124/82 SpO2: 100 %  BMI: Estimated body mass index is 26.75 kg/m as calculated from the following:   Height as of this encounter: 5\' 3"  (1.6 m).   Weight as of this encounter: 151 lb (68.5 kg).  Risk Assessment: Allergies: Reviewed. She is allergic to pregabalin, trazodone, bupropion, methocarbamol, and tramadol.  Allergy Precautions: None required Coagulopathies: Reviewed. None identified.  Blood-thinner therapy: None at this time Active Infection(s): Reviewed. None identified. Tanya [name] is afebrile  Site Confirmation: Tanya [name] was asked to confirm the procedure and laterality before marking the site Procedure checklist: Completed Consent: Before the procedure and under the influence of no sedative(s), amnesic(s), or anxiolytics, the patient was informed of the treatment options, risks and possible complications. To fulfill our ethical and legal obligations, as recommended by the American Medical Association's Code of Ethics, I have informed the patient of my clinical impression; the nature and purpose of the treatment or procedure; the risks, benefits, and possible complications of the intervention; the alternatives, including doing nothing; the risk(s) and benefit(s) of the alternative treatment(s) or procedure(s); and the risk(s) and benefit(s) of  doing nothing. The patient was provided information about the general risks and possible complications associated with the procedure. These may include, but are not limited to: failure to achieve desired goals, infection, bleeding, organ or nerve damage, allergic reactions, paralysis, and death. In addition, the patient  was informed of those risks and complications associated to the procedure, such as failure to decrease pain; infection; bleeding; organ or nerve damage with subsequent damage to sensory, motor, and/or autonomic systems, resulting in permanent pain, numbness, and/or weakness of one or several areas of the body; allergic reactions; (i.e.: anaphylactic reaction); and/or death. Furthermore, the patient was informed of those risks and complications associated with the medications. These include, but are not limited to: allergic reactions (i.e.: anaphylactic or anaphylactoid reaction(s)); adrenal axis suppression; blood sugar elevation that in diabetics may result in ketoacidosis or comma; water retention that in patients with history of congestive heart failure may result in shortness of breath, pulmonary edema, and decompensation with resultant heart failure; weight gain; swelling or edema; medication-induced neural toxicity; particulate matter embolism and blood vessel occlusion with resultant organ, and/or nervous system infarction; and/or aseptic necrosis of one or more joints. Finally, the patient was informed that Medicine is not an exact science; therefore, there is also the possibility of unforeseen or unpredictable risks and/or possible complications that may result in a catastrophic outcome. The patient indicated having understood very clearly. We have given the patient no guarantees and we have made no promises. Enough time was given to the patient to ask questions, all of which were answered to the patient's satisfaction. Tanya [name] has indicated that she wanted to continue with the  procedure. Attestation: I, the ordering provider, attest that I have discussed with the patient the benefits, risks, side-effects, alternatives, likelihood of achieving goals, and potential problems during recovery for the procedure that I have provided informed consent. Date  Time: 12/30/2019 11:31 AM  Pre-Procedure Preparation:  Monitoring: As per clinic protocol. Respiration, ETCO2, SpO2, BP, heart rate and rhythm monitor placed and checked for adequate function Safety Precautions: Patient was assessed for positional comfort and pressure points before starting the procedure. Time-out: I initiated and conducted the "Time-out" before starting the procedure, as per protocol. The patient was asked to participate by confirming the accuracy of the "Time Out" information. Verification of the correct person, site, and procedure were performed and confirmed by me, the nursing staff, and the patient. "Time-out" conducted as per Joint Commission's Universal Protocol (UP.01.01.01). Time: 1227  Description of Procedure:          Area Prepped: Entire shoulder Area DuraPrep (Iodine Povacrylex [0.7% available iodine] and Isopropyl Alcohol, 74% w/w) Safety Precautions: Aspiration looking for blood return was conducted prior to all injections. At no point did we inject any substances, as a needle was being advanced. No attempts were made at seeking any paresthesias. Safe injection practices and needle disposal techniques used. Medications properly checked for expiration dates. SDV (single dose vial) medications used. Description of the Procedure: Protocol guidelines were followed. The patient was placed in position over the procedure table. The target area was identified and the area prepped in the usual manner. Skin & deeper tissues infiltrated with local anesthetic. Appropriate amount of time allowed to pass for local anesthetics to take effect. The procedure needles were then advanced to the target area. Proper  needle placement secured. Negative aspiration confirmed. Solution injected in intermittent fashion, asking for systemic symptoms every 0.5cc of injectate. The needles were then removed and the area cleansed, making sure to leave some of the prepping solution back to take advantage of its long term bactericidal properties.         Vitals:   12/30/19 1231 12/30/19 1241 12/30/19 1251 12/30/19 1301  BP: 107/70 120/70 128/74 (P) 118/60  Pulse:      Resp: 18 12 12  (P) 14  Temp:  (!) 97.2 F (36.2 C)  (!) (P) 97.2 F (36.2 C)  SpO2: 99% 98% 96% (P) 100%  Weight:      Height:        Start Time: 1227 hrs. End Time: 1231 hrs. Materials:  Needle(s) Type: Spinal Needle Gauge: 22G Length: 3.5-in Medication(s): Please see orders for medications and dosing details.  Imaging Guidance (Non-Spinal):          Type of Imaging Technique: Fluoroscopy Guidance (Non-Spinal) Indication(s): Assistance in needle guidance and placement for procedures requiring needle placement in or near specific anatomical locations not easily accessible without such assistance. Exposure Time: Please see nurses notes. Contrast: None used. Fluoroscopic Guidance: I was personally present during the use of fluoroscopy. "Tunnel Vision Technique" used to obtain the best possible view of the target area. Parallax error corrected before commencing the procedure. "Direction-depth-direction" technique used to introduce the needle under continuous pulsed fluoroscopy. Once target was reached, antero-posterior, oblique, and lateral fluoroscopic projection used confirm needle placement in all planes. Images permanently stored in EMR. Interpretation: No contrast injected. I personally interpreted the imaging intraoperatively. Adequate needle placement confirmed in multiple planes. Permanent images saved into the patient's record.  Antibiotic Prophylaxis:   Anti-infectives (From admission, onward)   None     Indication(s): None  identified  Post-operative Assessment:  Post-procedure Vital Signs:  Pulse/HCG Rate: 83(!) (P) 56 Temp: (!) (P) 97.2 F (36.2 C) Resp: (P) 14 BP: (P) 118/60 SpO2: (P) 100 %  EBL: None  Complications: No immediate post-treatment complications observed by team, or reported by patient.  Note: The patient tolerated the entire procedure well. A repeat set of vitals were taken after the procedure and the patient was kept under observation following institutional policy, for this type of procedure. Post-procedural neurological assessment was performed, showing return to baseline, prior to discharge. The patient was provided with post-procedure discharge instructions, including a section on how to identify potential problems. Should any problems arise concerning this procedure, the patient was given instructions to immediately contact us, at any time, without hesitation. In any case, we plan to contact the patient by telephone for a follow-up status report regarding this interventional procedure.  Comments:  No additional relevant information.  Plan of Care  Orders:  Orders Placed This Encounter  Procedures  . SHOULDER INJECTION    Scheduling Instructions:     Procedure: Intra-articular shoulder (Glenohumeral) joint and (AC) Acromioclavicular joint injection     Side: Left-sided     Level: Glenohumeral joint and (AC) Acromioclavicular joint     Sedation: Patient's choice.     Timeframe: Today    Order Specific Question:   Where will this procedure be performed?    Answer:   [facility]  . DG [facility] C-ARM 1-60 MIN NO REPORT    Intraoperative interpretation by procedural physician at [place].    Standing Status:   Standing    Number of Occurrences:   1    Order Specific Question:   Reason for exam:    Answer:   Assistance in needle guidance and placement for procedures requiring needle placement in or near specific anatomical locations not easily accessible  without such assistance.  . Informed Consent Details: Physician/Practitioner Attestation; Transcribe to consent form and obtain patient signature    Note: Always confirm laterality of pain with Ms. Spoto, before procedure.    Order Specific Question:  Physician/Practitioner attestation of informed consent for procedure/surgical case    Answer:   I, the physician/practitioner, attest that I have discussed with the patient the benefits, risks, side effects, alternatives, likelihood of achieving goals and potential problems during recovery for the procedure that I have provided informed consent.    Order Specific Question:   Procedure    Answer:   Intra-articular shoulder joint injection under fluoroscopic guidance    Order Specific Question:   Physician/Practitioner performing the procedure    Answer:   Tanita Palinkas A. Dossie Arbour, MD    Order Specific Question:   Indication/Reason    Answer:   Chronic shoulder pain secondary to shoulder arthropathy  . Provide equipment / supplies at bedside    "Block Tray" (Disposable  single use) Needle type: SpinalSpinal Amount/quantity: 1 Size: Regular (3.5-inch) Gauge: 22G    Standing Status:   Standing    Number of Occurrences:   1    Order Specific Question:   Specify    Answer:   Block Tray   Chronic Opioid Analgesic:  Oxycodone IR 5 mg, 1 tab PO BID (10 mg/day of oxycodone) MME/day: 15 mg/day.   Medications ordered for procedure: Meds ordered this encounter  Medications  . lidocaine (XYLOCAINE) 2 % (with pres) injection 400 mg  . lactated ringers infusion 1,000 mL  . midazolam (VERSED) 5 MG/5ML injection 1-2 mg    Make sure Flumazenil is available in the pyxis when using this medication. If oversedation occurs, administer 0.2 mg IV over 15 sec. If after 45 sec no response, administer 0.2 mg again over 1 min; may repeat at 1 min intervals; not to exceed 4 doses (1 mg)  . fentaNYL (SUBLIMAZE) injection 25-50 mcg    Make sure Narcan is available  in the pyxis when using this medication. In the event of respiratory depression (RR< 8/min): Titrate NARCAN (naloxone) in increments of 0.1 to 0.2 mg IV at 2-3 minute intervals, until desired degree of reversal.  . ropivacaine (PF) 2 mg/mL (0.2%) (NAROPIN) injection 9 mL  . methylPREDNISolone acetate (DEPO-MEDROL) injection 80 mg   Medications administered: We administered lidocaine, lactated ringers, midazolam, fentaNYL, ropivacaine (PF) 2 mg/mL (0.2%), and methylPREDNISolone acetate.  See the medical record for exact dosing, route, and time of administration.  Follow-up plan:   Return in about 2 weeks (around 01/13/2020) for (F2F), (PP) Follow-up.       Interventional management options:  Considering:   Diagnostic right suprascapular NB Possible right suprascapular nerve RFA Diagnostic bilateral lumbar facet block Possible bilateral lumbar facetRFA Possible left-sided occipital nerve RFA    Palliative PRN treatment(s):   Palliative right glenohumeral joint injection #3  Palliative right AC joint injection #4  Palliative left medial epicondyle elbow injection #2  Diagnostic left GONB #2  Palliative right CESI(Series #2) Palliative bilateral cervical facet block  Palliative right cervical facet RFA#3(Last done on: 10/23/17) Palliative left cervical facet RFA #2 (Last done on: 06/26/2016)         Recent Visits Date Type Provider Dept  12/22/19 Office Visit Milinda Pointer, MD [facility]  Showing recent visits within past 90 days and meeting all other requirements Today's Visits Date Type Provider Dept  12/30/19 Procedure visit Milinda Pointer, MD [facility]  Showing today's visits and meeting all other requirements Future Appointments Date Type Provider Dept  01/17/20 Appointment Milinda Pointer, MD [facility]  Showing future appointments within next 90 days and meeting all other requirements  Disposition: Discharge home  Discharge (Date  Time): 12/30/2019; 1305 hrs.   Primary Care Physician: Henrietta Hoover, MD Location: [facility] Note by: Gaspar Cola, MD Date: 12/30/2019; Time: 1:28 PM  Disclaimer:  Medicine is not an Chief Strategy Officer. The only guarantee in medicine is that nothing is guaranteed. It is important to note that the decision to proceed with this intervention was based on the information collected from the patient. The Data and conclusions were drawn from the patient's questionnaire, the interview, and the physical examination. Because the information was provided in large part by the patient, it cannot be guaranteed that it has not been purposely or unconsciously manipulated. Every effort has been made to obtain as much relevant data as possible for this evaluation. It is important to note that the conclusions that lead to this procedure are derived in large part from the available data. Always take into account that the treatment will also be dependent on availability of resources and existing treatment guidelines, considered by other Pain Management Practitioners as being common knowledge and practice, at the time of the intervention. For Medico-Legal purposes, it is also important to point out that variation in procedural techniques and pharmacological choices are the acceptable norm. The indications, contraindications, technique, and results of the above procedure should only be interpreted and judged by a Board-Certified Interventional Pain Specialist with extensive familiarity and expertise in the same exact procedure and technique.

## 2019-12-31 ENCOUNTER — Telehealth: Payer: Self-pay | Admitting: *Deleted

## 2019-12-31 NOTE — Telephone Encounter (Signed)
No problems post procedure. 

## 2020-01-16 NOTE — Progress Notes (Signed)
PROVIDER NOTE: Information contained herein reflects review and annotations entered in association with encounter. Interpretation of such information and data should be left to medically-trained personnel. Information provided to patient can be located elsewhere in the medical record under "Patient Instructions". Document created using STT-dictation technology, any transcriptional errors that may result from process are unintentional.    Patient: Tanya [name]  Service Category: E/M  Provider: Gaspar Cola, MD  DOB: [birth year]  DOS: 01/17/2020  Specialty: Interventional Pain Management  MRN: [id number]  Setting: Ambulatory outpatient  PCP: Henrietta Hoover, MD  Type: Established Patient    Referring Provider: [name]*  Location: Office  Delivery: Face-to-face     HPI  [name], a 59 y.o. year old female, is here today because of her Chronic pain syndrome [G89.4]. Ms. Aden primary complain today is Shoulder Pain Last encounter: My last encounter with her was on 12/30/2019. Pertinent problems: Ms. Antonucci has Chronic upper back pain (Secondary area of Pain) (Bilateral) (R>L); Chronic low back pain (Third area of Pain) (Bilateral) (R>L); Chronic neck pain (Primary Area of Pain) (Bilateral) (R>L); Fibromyalgia; Osteoarthritis; Chronic pain syndrome; Chronic upper extremity pain (Right); Chronic cervical radicular pain (Right); Chronic shoulder pain (Bilateral) (R>L); Chronic musculoskeletal pain; Cervical spondylosis; Cervical facet syndrome (Bilateral) (R>L); Shoulder radicular pain (Bilateral) (R>L); Spondylosis without myelopathy or radiculopathy, cervical region; Cervicalgia; Occipital headache (Left); Neurogenic pain; Cervico-occipital neuralgia (Left); Chronic elbow pain (Left); Osteoarthritis of AC (acromioclavicular) joint (Right); Chronic acromioclavicular joint pain (Right); Chronic shoulder pain (Right); Epicondylitis elbow, medial (Left);  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 Pain Assessment: Severity of Chronic pain is reported as a 7 /10. Location: Shoulder Right/pain radiaties down toward her elbow, pain is everywhere. Onset: More than a month ago. Quality: Aching,Burning,Cramping,Shooting,Throbbing,Discomfort. Timing: Constant. Modifying factor(s): meds and laying. Vitals:  height is 5' 3"  (1.6 m) and weight is 151 lb (68.5 kg). Her temperature is 97.4 F (36.3 C) (abnormal). Her blood pressure is 109/71 and her pulse is 77. Her oxygen saturation is 97%.   Reason for encounter: post-procedure assessment.  The patient indicates that while the numbing medicine was in place she was having 100% relief of the pain which lasted for that first day and a continue for another 5 days.  Unfortunately, the relief has worn off and the pain has come back.  This confirms that there is a component of the pain that is coming from the area of the left glenohumeral and acromioclavicular joint.  However, the patient indicates that the primary pain at this point is that of the back of the neck and the area between the neck and shoulder blade.  She refers that this is the same type of pain that she was experiencing when she had the problem with the cervical facet syndrome.  She reminded me that her last radiofrequency ablation of the cervical facets provided her with 100% relief of the pain that lasted for quite some time.  In reviewing the notes, I see that her last left cervical facet medial branch radiofrequency ablation was done on 10/23/2017, more than 2 years ago, and according to her it provided her with excellent relief of the pain until recently when it started coming back.  She would like to have that one repeated.  Statement of Medical Necessity:  Ms. Avans to experienced debilitating chronic nerve-associated pain from the  Cervical Facet Syndrome (Spondylosis without  myelopathy or radiculopathy, cervical region [M47.812]).  Duration: This pain has persisted for longer than three months.  Non-surgical care: The patient has either failed to respond, or was unable to tolerate, or simply did not get enough benefit from other more conservative therapies including, but not limited to: 1. Over-the-counter oral analgesic medications (i.e.: ibuprofen, naproxen, etc.) 2. Anti-inflammatory medications 3. Muscle relaxants 4. Membrane stabilizers 5. Opioids 6. Physical therapy (PT), chiropractic manipulation, and/or home exercise program (HEP). 7. Modalities (Heat, ice, etc.)  Invasive therapies: Prior cervical facet radiofrequency ablation provided the patient with more than 2 years of almost complete relief of the pain.  Surgical care: Not indicated.  Physical exam: Has been consistent with Cervical Facet Syndrome.  Diagnostic imaging: Cervical Facet Arthropathy.                Diagnostic interventional therapies: Ms. Forge has attained greater than 50% reduction in pain from at least two (2) diagnostic medial branch blocks conducted in separate occasions.   For the above listed reason, I believe, as the examining and treating physician, that it is medically necessary to proceed with Non-Pulsed Radiofrequency Ablation for the purpose of attempting to prolong the duration of the benefits seen with the diagnostic injections.  RTCB: 05/04/2020 Nonopioids transfer 12/21/2020: Baclofen and Neurontin  Post-Procedure Evaluation  Procedure (12/30/2019): Diagnostic left glenohumeral and acromioclavicular joint injection #1 under fluoroscopic guidance and IV sedation Pre-procedure pain level: 6/10 Post-procedure: 0/10 (100% relief)  Sedation: Sedation provided.  Effectiveness during initial hour after procedure(Ultra-Short Term Relief): 100 %.  Local anesthetic used: Long-acting (4-6 hours) Effectiveness: Defined as  any analgesic benefit obtained secondary to the administration of local anesthetics. This carries significant diagnostic value as to the etiological location, or anatomical origin, of the pain. Duration of benefit is expected to coincide with the duration of the local anesthetic used.  Effectiveness during initial 4-6 hours after procedure(Short-Term Relief): 100 %.  Long-term benefit: Defined as any relief past the pharmacologic duration of the local anesthetics.  Effectiveness past the initial 6 hours after procedure(Long-Term Relief): 100 % (last for 5 days).  Current benefits: Defined as benefit that persist at this time.   Analgesia:  Back to baseline Function: Back to baseline ROM: Back to baseline  Pharmacotherapy Assessment   Analgesic: Oxycodone IR 5 mg, 1 tab PO BID (10 mg/day of oxycodone) MME/day: 15 mg/day.   Monitoring: [place] PMP: PDMP reviewed during this encounter.       Pharmacotherapy: No side-effects or adverse reactions reported. Compliance: No problems identified. Effectiveness: Clinically acceptable.  Chauncey Fischer, RN  01/17/2020  1:32 PM  Sign when Signing Visit Nursing Pain Medication Assessment:  Safety precautions to be maintained throughout the outpatient stay will include: orient to surroundings, keep bed in low position, maintain call bell within reach at all times, provide assistance with transfer out of bed and ambulation.  Medication Inspection Compliance: Pill count conducted under aseptic conditions, in front of the patient. Neither the pills nor the bottle was removed from the patient's sight at any time. Once count was completed pills were immediately returned to the patient in their original bottle.  Medication: Oxycodone IR Pill/Patch Count: 29 of 60 pills remain Pill/Patch Appearance: Markings consistent with prescribed medication Bottle Appearance: Standard pharmacy container. Clearly labeled. Filled Date: 74 / 20 / 21 Last Medication intake:   TodaySafety precautions to be maintained throughout the outpatient stay will include: orient to surroundings, keep bed in low position, maintain call bell within reach at  all times, provide assistance with transfer out of bed and ambulation.   Pt stated that she took more meds than prescribed due to increase pain with her father in hospital. She has to do lot of walking to go see him in the hospital.    UDS:  Summary  Date Value Ref Range Status  07/01/2019 Note  Final    Comment:    ==================================================================== [employer] [facility] (MW) ==================================================================== Specimen Alert Note: Urinary creatinine is low; ability to detect some drugs may be compromised. Interpret results with caution. (Creatinine) ==================================================================== Test                             Result       Flag       Units  Drug Present and Declared for Prescription Verification   Oxycodone                      1113         EXPECTED   ng/mg creat   Oxymorphone                    773          EXPECTED   ng/mg creat   Noroxycodone                   1840         EXPECTED   ng/mg creat    Sources of oxycodone include scheduled prescription medications.    Oxymorphone and noroxycodone are expected metabolites of oxycodone.    Oxymorphone is also available as a scheduled prescription medication.  ==================================================================== Test                      Result    Flag   Units      Ref Range   Creatinine              15        LL     mg/dL      >=20 ==================================================================== Declared Medications:  The flagging and interpretation on this report are based on the  following declared medications.  Unexpected results may arise from  inaccuracies in the declared medications.   **Note: The testing scope of this panel includes  these medications:   Oxycodone (Roxicodone)   **Note: The testing scope of this panel does not include the  following reported medications:   Albuterol (Ventolin HFA)  Atorvastatin (Lipitor)  Cyclobenzaprine (Flexeril)  Doxycycline (Doryx)  Duloxetine (Cymbalta)  Gabapentin (Neurontin)  Mirtazapine (Remeron)  Pantoprazole (Protonix)  Pregabalin (Lyrica)  Triamcinolone (Kenalog) ==================================================================== For clinical consultation, please call [phone]. ====================================================================      ROS  Constitutional: Denies any fever or chills Gastrointestinal: No reported hemesis, hematochezia, vomiting, or acute GI distress Musculoskeletal: Denies any acute onset joint swelling, redness, loss of ROM, or weakness Neurological: No reported episodes of acute onset apraxia, aphasia, dysarthria, agnosia, amnesia, paralysis, loss of coordination, or loss of consciousness  Medication Review  DULoxetine, atorvastatin, baclofen, busPIRone, gabapentin, hydroxychloroquine, nicotine, oxyCODONE, and triamcinolone  History Review  Allergy: Ms. Scroggins is allergic to pregabalin, trazodone, bupropion, methocarbamol, and tramadol. Drug: Ms. Barman  reports no history of drug use. Alcohol:  reports no history of alcohol use. Tobacco:  reports that she has quit smoking. Her smoking use included cigarettes. She has a 17.50 pack-year smoking history. She has never used  smokeless tobacco. Social: Ms. Viar  reports that she has quit smoking. Her smoking use included cigarettes. She has a 17.50 pack-year smoking history. She has never used smokeless tobacco. She reports that she does not drink alcohol and does not use drugs. Medical:  has a past medical history of Anxiety, Bladder infection, Chronic lower back pain, Chronic neck pain, Collagen vascular disease ([place]), Depression, Diverticulitis, Hyperlipidemia,  Lupus ([place]), and Overactive bladder. Surgical: Ms. Cheek  has a past surgical history that includes Abdominal hysterectomy; Colonoscopy; Colonoscopy with propofol (N/A, 07/03/2017); Tonsillectomy; Esophagogastroduodenoscopy (N/A, 10/21/2017); and Colonoscopy with propofol (N/A, 10/21/2017). Family: family history includes Cancer in her mother; Diabetes in her brother; Emphysema in her mother; Glaucoma in her father; Heart disease in her father; Hypertension in her mother; Stroke in her mother.  Laboratory Chemistry Profile   Renal Lab Results  Component Value Date   BUN 7 06/09/2019   CREATININE 0.72 06/09/2019   BCR 15 09/03/2017   GFRAA >60 06/09/2019   GFRNONAA >60 06/09/2019     Hepatic Lab Results  Component Value Date   AST 26 06/09/2019   ALT 20 06/09/2019   ALBUMIN 4.1 06/09/2019   ALKPHOS 29 (L) 06/09/2019   LIPASE 35 06/09/2019     Electrolytes Lab Results  Component Value Date   NA 135 06/09/2019   K 4.2 06/09/2019   CL 102 06/09/2019   CALCIUM 8.4 (L) 06/09/2019   MG 1.9 09/03/2017     Bone Lab Results  Component Value Date   25OHVITD1 25 (L) 09/03/2017   25OHVITD2 2.9 09/03/2017   25OHVITD3 22 09/03/2017     Inflammation (CRP: Acute Phase) (ESR: Chronic Phase) Lab Results  Component Value Date   CRP 2 09/03/2017   ESRSEDRATE 21 09/03/2017       Note: Above Lab results reviewed.  Recent Imaging Review  DG PAIN CLINIC C-ARM 1-60 MIN NO REPORT Fluoro was used, but no Radiologist interpretation will be provided.  Please refer to "NOTES" tab for provider progress note. Note: Reviewed        Physical Exam  General appearance: Well nourished, well developed, and well hydrated. In no apparent acute distress Mental status: Alert, oriented x 3 (person, place, & time)       Respiratory: No evidence of acute respiratory distress Eyes: PERLA Vitals: BP 109/71   Pulse 77   Temp (!) 97.4 F (36.3 C)   Ht 5' 3"  (1.6 m)   Wt 151 lb (68.5 kg)   SpO2  97%   BMI 26.75 kg/m  BMI: Estimated body mass index is 26.75 kg/m as calculated from the following:   Height as of this encounter: 5' 3"  (1.6 m).   Weight as of this encounter: 151 lb (68.5 kg). Ideal: Ideal body weight: 52.4 kg (115 lb 8.3 oz) Adjusted ideal body weight: 58.8 kg (129 lb 11.4 oz)  Assessment   Status Diagnosis  Controlled Controlled Controlled 1. Chronic pain syndrome   2. Cervical facet syndrome (Bilateral) (R>L)   3. Chronic shoulder pain (Bilateral) (R>L)   4. Chronic neck pain (Primary Area of Pain) (Bilateral) (R>L)   5. Chronic shoulder pain (Left)   6. Osteoarthritis of shoulder (Left)   7. Chronic upper back pain (Secondary area of Pain) (Bilateral) (R>L)   8. Chronic low back pain (Third area of Pain) (Bilateral) (R>L)      Updated Problems: No problems updated.  Plan of Care  Problem-specific:  No problem-specific Assessment & Plan notes found for this  encounter.  Ms. TRENESHA ALCAIDE has a current medication list which includes the following long-term medication(s): baclofen, gabapentin, [START ON 02/04/2020] oxycodone, [START ON 03/05/2020] oxycodone, and [START ON 04/04/2020] oxycodone.  Pharmacotherapy (Medications Ordered): Meds ordered this encounter  Medications  . oxyCODONE (OXY IR/ROXICODONE) 5 MG immediate release tablet    Sig: Take 1 tablet (5 mg total) by mouth 2 (two) times daily. Must last 30 days    Dispense:  60 tablet    Refill:  0    Chronic Pain: STOP Act (Not applicable) Fill 1 day early if closed on refill date. Avoid benzodiazepines within 8 hours of opioids   Orders:  Orders Placed This Encounter  Procedures  . Radiofrequency,Cervical    Standing Status:   Future    Standing Expiration Date:   05/17/2020    Scheduling Instructions:     Side(s): Left-sided     Level(s): C3, C4, C5, C6, & C7 Medial Branch Nerve(s)     Sedation: Patient's choice.     Scheduling Timeframe: As soon as pre-approved    Order Specific  Question:   Where will this procedure be performed?    Answer:   [facility]   Follow-up plan:   Return in about 4 months (around 05/04/2020) for RFA (107mn): (L) C-FCT RFA #3, in addition, (F2F), (Med Mgmt).      Interventional management options:  Considering:   Therapeutic/palliative left cervical facet RFA #3 (last done 10/23/2017)  Diagnostic right suprascapular NB Possible right suprascapular nerve RFA Diagnostic bilateral lumbar facet block Possible bilateral lumbar facetRFA Possible left-sided occipital nerve RFA    Palliative PRN treatment(s):   Palliative right glenohumeral joint injection #3  Palliative right AC joint injection #4  Palliative left medial epicondyle elbow injection #2  Diagnostic left GONB #2  Palliative right CESI(Series #2) Palliative bilateral cervical facet block  Palliative right cervical facet RFA#3(Last done: 12/24/2016) Palliative left cervical facet RFA #3 (Last done: 10/23/2017)    Recent Visits Date Type Provider Dept  12/30/19 Procedure visit NMilinda Pointer MD [facility]  12/22/19 Office Visit NMilinda Pointer MD [facility]  Showing recent visits within past 90 days and meeting all other requirements Today's Visits Date Type Provider Dept  01/17/20 Office Visit NMilinda Pointer MD [facility]  Showing today's visits and meeting all other requirements Future Appointments Date Type Provider Dept  04/03/20 Appointment NMilinda Pointer MD [facility]  Showing future appointments within next 90 days and meeting all other requirements  I discussed the assessment and treatment plan with the patient. The patient was provided an opportunity to ask questions and all were answered. The patient agreed with the plan and demonstrated an understanding of the instructions.  Patient advised to call back or seek an in-person evaluation if the symptoms or condition worsens.  Duration  of encounter: 36 minutes.  Note by: FGaspar Cola MD Date: 01/17/2020; Time: 2:10 PM

## 2020-01-17 ENCOUNTER — Encounter: Payer: Self-pay | Admitting: Pain Medicine

## 2020-01-17 ENCOUNTER — Other Ambulatory Visit: Payer: Self-pay

## 2020-01-17 ENCOUNTER — Ambulatory Visit: Payer: Medicaid Other | Attending: Pain Medicine | Admitting: Pain Medicine

## 2020-01-17 VITALS — BP 109/71 | HR 77 | Temp 97.4°F | Ht 63.0 in | Wt 151.0 lb

## 2020-01-17 DIAGNOSIS — M545 Low back pain, unspecified: Secondary | ICD-10-CM | POA: Diagnosis present

## 2020-01-17 DIAGNOSIS — M25511 Pain in right shoulder: Secondary | ICD-10-CM | POA: Insufficient documentation

## 2020-01-17 DIAGNOSIS — G8929 Other chronic pain: Secondary | ICD-10-CM | POA: Insufficient documentation

## 2020-01-17 DIAGNOSIS — M19012 Primary osteoarthritis, left shoulder: Secondary | ICD-10-CM | POA: Diagnosis present

## 2020-01-17 DIAGNOSIS — M25512 Pain in left shoulder: Secondary | ICD-10-CM | POA: Diagnosis present

## 2020-01-17 DIAGNOSIS — M542 Cervicalgia: Secondary | ICD-10-CM | POA: Insufficient documentation

## 2020-01-17 DIAGNOSIS — G894 Chronic pain syndrome: Secondary | ICD-10-CM | POA: Diagnosis present

## 2020-01-17 DIAGNOSIS — M549 Dorsalgia, unspecified: Secondary | ICD-10-CM | POA: Diagnosis present

## 2020-01-17 DIAGNOSIS — M47812 Spondylosis without myelopathy or radiculopathy, cervical region: Secondary | ICD-10-CM | POA: Diagnosis not present

## 2020-01-17 MED ORDER — OXYCODONE HCL 5 MG PO TABS: 5.0000 mg | ORAL_TABLET | Freq: Two times a day (BID) | ORAL | 0 refills | Status: DC

## 2020-01-17 NOTE — Patient Instructions (Addendum)
____________________________________________________________________________________________  Preparing for Procedure with Sedation  Procedure appointments are limited to planned procedures: . No Prescription Refills. . No disability issues will be discussed. . No medication changes will be discussed.  Instructions: . Oral Intake: Do not eat or drink anything for at least 8 hours prior to your procedure. (Exception: Blood Pressure Medication. See below.) . Transportation: Unless otherwise stated by your physician, you may drive yourself after the procedure. . Blood Pressure Medicine: Do not forget to take your blood pressure medicine with a sip of water the morning of the procedure. If your Diastolic (lower reading)is above 100 mmHg, elective cases will be cancelled/rescheduled. . Blood thinners: These will need to be stopped for procedures. Notify our staff if you are taking any blood thinners. Depending on which one you take, there will be specific instructions on how and when to stop it. . Diabetics on insulin: Notify the staff so that you can be scheduled 1st case in the morning. If your diabetes requires high dose insulin, take only  of your normal insulin dose the morning of the procedure and notify the staff that you have done so. . Preventing infections: Shower with an antibacterial soap the morning of your procedure. . Build-up your immune system: Take 1000 mg of Vitamin C with every meal (3 times a day) the day prior to your procedure. . Antibiotics: Inform the staff if you have a condition or reason that requires you to take antibiotics before dental procedures. . Pregnancy: If you are pregnant, call and cancel the procedure. . Sickness: If you have a cold, fever, or any active infections, call and cancel the procedure. . Arrival: You must be in the facility at least 30 minutes prior to your scheduled procedure. . Children: Do not bring children with you. . Dress appropriately:  Bring dark clothing that you would not mind if they get stained. . Valuables: Do not bring any jewelry or valuables.  Reasons to call and reschedule or cancel your procedure: (Following these recommendations will minimize the risk of a serious complication.) . Surgeries: Avoid having procedures within 2 weeks of any surgery. (Avoid for 2 weeks before or after any surgery). . Flu Shots: Avoid having procedures within 2 weeks of a flu shots or . (Avoid for 2 weeks before or after immunizations). . Barium: Avoid having a procedure within 7-10 days after having had a radiological study involving the use of radiological contrast. (Myelograms, Barium swallow or enema study). . Heart attacks: Avoid any elective procedures or surgeries for the initial 6 months after a "Myocardial Infarction" (Heart Attack). . Blood thinners: It is imperative that you stop these medications before procedures. Let us know if you if you take any blood thinner.  . Infection: Avoid procedures during or within two weeks of an infection (including chest colds or gastrointestinal problems). Symptoms associated with infections include: Localized redness, fever, chills, night sweats or profuse sweating, burning sensation when voiding, cough, congestion, stuffiness, runny nose, sore throat, diarrhea, nausea, vomiting, cold or Flu symptoms, recent or current infections. It is specially important if the infection is over the area that we intend to treat. . Heart and lung problems: Symptoms that may suggest an active cardiopulmonary problem include: cough, chest pain, breathing difficulties or shortness of breath, dizziness, ankle swelling, uncontrolled high or unusually low blood pressure, and/or palpitations. If you are experiencing any of these symptoms, cancel your procedure and contact your primary care physician for an evaluation.  Remember:  Regular Business hours are:    Monday to Thursday 8:00 AM to 4:00 PM  Provider's  Schedule: Milinda Pointer, MD:  Procedure days: Tuesday and Thursday 7:30 AM to 4:00 PM  Gillis Santa, MD:  Procedure days: Monday and Wednesday 7:30 AM to 4:00 PM ____________________________________________________________________________________________    Radiofrequency Lesioning Radiofrequency lesioning is a procedure that is performed to relieve pain. The procedure is often used for back, neck, or arm pain. Radiofrequency lesioning involves the use of a machine that creates radio waves to make heat. During the procedure, the heat is applied to the nerve that carries the pain signal. The heat damages the nerve and interferes with the pain signal. Pain relief usually starts about 2 weeks after the procedure and lasts for 6 months to 1 year. You will be awake during the procedure. You will need to be able to talk with the health care provider during the procedure. Tell a health care provider about:  Any allergies you have.  All medicines you are taking, including vitamins, herbs, eye drops, creams, and over-the-counter medicines.  Any problems you or family members have had with anesthetic medicines.  Any blood disorders you have.  Any surgeries you have had.  Any medical conditions you have or have had.  Whether you are pregnant or may be pregnant. What are the risks? Generally, this is a safe procedure. However, problems may occur, including:  Pain or soreness at the injection site.  Allergic reaction to medicines given during the procedure.  Bleeding.  Infection at the injection site.  Damage to nerves or blood vessels. What happens before the procedure? Staying hydrated Follow instructions from your health care provider about hydration, which may include:  Up to 2 hours before the procedure - you may continue to drink clear liquids, such as water, clear fruit juice, black coffee, and plain tea. Eating and drinking Follow instructions from your health care  provider about eating and drinking, which may include:  8 hours before the procedure - stop eating heavy meals or foods, such as meat, fried foods, or fatty foods.  6 hours before the procedure - stop eating light meals or foods, such as toast or cereal.  6 hours before the procedure - stop drinking milk or drinks that contain milk.  2 hours before the procedure - stop drinking clear liquids. Medicines Ask your health care provider about:  Changing or stopping your regular medicines. This is especially important if you are taking diabetes medicines or blood thinners.  Taking medicines such as aspirin and ibuprofen. These medicines can thin your blood. Do not take these medicines unless your health care provider tells you to take them.  Taking over-the-counter medicines, vitamins, herbs, and supplements. General instructions  Plan to have someone take you home from the hospital or clinic.  If you will be going home right after the procedure, plan to have someone with you for 24 hours.  Ask your health care provider what steps will be taken to help prevent infection. These may include: ? Removing hair at the procedure site. ? Washing skin with a germ-killing soap. ? Taking antibiotic medicine. What happens during the procedure?   An IV will be inserted into one of your veins.  You will be given one or more of the following: ? A medicine to help you relax (sedative). ? A medicine to numb the area (local anesthetic).  Your health care provider will insert a radiofrequency needle into the area to be treated. This is done with the help of a type  of X-ray (fluoroscopy).  A wire that carries the radio waves (electrode) will be put through the radiofrequency needle.  An electrical pulse will be sent through the electrode to verify the correct nerve that is causing your pain. You will feel a tingling sensation, and you may have muscle twitching.  The tissue around the needle tip will  be heated by an electric current that comes from the radiofrequency machine. This will numb the nerves.  The needle will be removed.  A bandage (dressing) will be put on the insertion area. The procedure may vary among health care providers and hospitals. What happens after the procedure?  Your blood pressure, heart rate, breathing rate, and blood oxygen level will be monitored until you leave the hospital or clinic.  Return to your normal activities as told by your health care provider. Ask your health care provider what activities are safe for you.  Do not drive for 24 hours if you were given a sedative during your procedure. Summary  Radiofrequency lesioning is a procedure that is performed to relieve pain. The procedure is often used for back, neck, or arm pain.  Radiofrequency lesioning involves the use of a machine that creates radio waves to make heat.  Plan to have someone take you home from the hospital or clinic. Do not drive for 24 hours if you were given a sedative during your procedure.  Return to your normal activities as told by your health care provider. Ask your health care provider what activities are safe for you. This information is not intended to replace advice given to you by your health care provider. Make sure you discuss any questions you have with your health care provider. Document Revised: 09/25/2017 Document Reviewed: 09/25/2017 Elsevier Patient Education  [employer].

## 2020-01-17 NOTE — Progress Notes (Signed)
Nursing Pain Medication Assessment:  Safety precautions to be maintained throughout the outpatient stay will include: orient to surroundings, keep bed in low position, maintain call bell within reach at all times, provide assistance with transfer out of bed and ambulation.  Medication Inspection Compliance: Pill count conducted under aseptic conditions, in front of the patient. Neither the pills nor the bottle was removed from the patient's sight at any time. Once count was completed pills were immediately returned to the patient in their original bottle.  Medication: Oxycodone IR Pill/Patch Count: 29 of 60 pills remain Pill/Patch Appearance: Markings consistent with prescribed medication Bottle Appearance: Standard pharmacy container. Clearly labeled. Filled Date: 11 / 20 / 21 Last Medication intake:  TodaySafety precautions to be maintained throughout the outpatient stay will include: orient to surroundings, keep bed in low position, maintain call bell within reach at all times, provide assistance with transfer out of bed and ambulation.   Pt stated that she took more meds than prescribed due to increase pain with her father in hospital. She has to do lot of walking to go see him in the hospital.

## 2020-02-29 ENCOUNTER — Other Ambulatory Visit: Payer: Self-pay | Admitting: Pain Medicine

## 2020-02-29 DIAGNOSIS — M792 Neuralgia and neuritis, unspecified: Secondary | ICD-10-CM

## 2020-02-29 DIAGNOSIS — G8929 Other chronic pain: Secondary | ICD-10-CM

## 2020-02-29 DIAGNOSIS — M797 Fibromyalgia: Secondary | ICD-10-CM

## 2020-02-29 DIAGNOSIS — M7918 Myalgia, other site: Secondary | ICD-10-CM

## 2020-03-14 ENCOUNTER — Other Ambulatory Visit: Payer: Self-pay

## 2020-03-14 ENCOUNTER — Ambulatory Visit
Admission: RE | Admit: 2020-03-14 | Discharge: 2020-03-14 | Disposition: A | Discharge status: Home / Self Care | Payer: Medicaid Other | Source: Ambulatory Visit | Attending: Pain Medicine | Admitting: Pain Medicine

## 2020-03-14 ENCOUNTER — Ambulatory Visit (HOSPITAL_BASED_OUTPATIENT_CLINIC_OR_DEPARTMENT_OTHER): Payer: Medicaid Other | Admitting: Pain Medicine

## 2020-03-14 ENCOUNTER — Encounter: Payer: Self-pay | Admitting: Pain Medicine

## 2020-03-14 VITALS — BP 124/64 | HR 76 | Temp 97.1°F | Resp 16 | Ht 63.0 in | Wt 151.0 lb

## 2020-03-14 DIAGNOSIS — G8918 Other acute postprocedural pain: Secondary | ICD-10-CM | POA: Insufficient documentation

## 2020-03-14 DIAGNOSIS — G8929 Other chronic pain: Secondary | ICD-10-CM | POA: Insufficient documentation

## 2020-03-14 DIAGNOSIS — M5481 Occipital neuralgia: Secondary | ICD-10-CM

## 2020-03-14 DIAGNOSIS — M549 Dorsalgia, unspecified: Secondary | ICD-10-CM

## 2020-03-14 DIAGNOSIS — M5030 Other cervical disc degeneration, unspecified cervical region: Secondary | ICD-10-CM

## 2020-03-14 DIAGNOSIS — M542 Cervicalgia: Secondary | ICD-10-CM | POA: Diagnosis not present

## 2020-03-14 DIAGNOSIS — M47812 Spondylosis without myelopathy or radiculopathy, cervical region: Secondary | ICD-10-CM | POA: Insufficient documentation

## 2020-03-14 MED ORDER — LIDOCAINE HCL 2 % IJ SOLN
20.0000 mL | Freq: Once | INTRAMUSCULAR | Status: AC
  Administered 2020-03-14: 200 mg
  Filled 2020-03-14: qty 20

## 2020-03-14 MED ORDER — HYDROCODONE-ACETAMINOPHEN 5-325 MG PO TABS: 1.0000 | ORAL_TABLET | Freq: Three times a day (TID) | ORAL | 0 refills | Status: AC | PRN

## 2020-03-14 MED ORDER — FENTANYL CITRATE (PF) 100 MCG/2ML IJ SOLN
25.0000 ug | INTRAMUSCULAR | Status: DC | PRN
  Administered 2020-03-14: 50 ug via INTRAVENOUS
  Filled 2020-03-14: qty 2

## 2020-03-14 MED ORDER — ROPIVACAINE HCL 2 MG/ML IJ SOLN
9.0000 mL | Freq: Once | INTRAMUSCULAR | Status: AC
  Administered 2020-03-14: 9 mL via PERINEURAL
  Filled 2020-03-14: qty 10

## 2020-03-14 MED ORDER — LIDOCAINE HCL (PF) 2 % IJ SOLN
INTRAMUSCULAR | Status: AC
  Filled 2020-03-14: qty 10

## 2020-03-14 MED ORDER — DEXAMETHASONE SODIUM PHOSPHATE 10 MG/ML IJ SOLN
10.0000 mg | Freq: Once | INTRAMUSCULAR | Status: AC
  Administered 2020-03-14: 10 mg
  Filled 2020-03-14: qty 1

## 2020-03-14 MED ORDER — LACTATED RINGERS IV SOLN
1000.0000 mL | Freq: Once | INTRAVENOUS | Status: AC
  Administered 2020-03-14: 1000 mL via INTRAVENOUS

## 2020-03-14 MED ORDER — MIDAZOLAM HCL 5 MG/5ML IJ SOLN
1.0000 mg | INTRAMUSCULAR | Status: DC | PRN
  Administered 2020-03-14: 1 mg via INTRAVENOUS
  Filled 2020-03-14: qty 5

## 2020-03-14 NOTE — Patient Instructions (Addendum)
___________________________________________________________________________________________  Post-Radiofrequency (RF) Discharge Instructions  You have just completed a Radiofrequency Neurotomy.  The following instructions will provide you with information and guidelines for self-care upon discharge.  If at any time you have questions or concerns please call your physician. DO NOT DRIVE YOURSELF!!  Instructions:  Apply ice: Fill a plastic sandwich bag with crushed ice. Cover it with a small towel and apply to injection site. Apply for 15 minutes then remove x 15 minutes. Repeat sequence on day of procedure, until you go to bed. The purpose is to minimize swelling and discomfort after procedure.  Apply heat: Apply heat to procedure site starting the day following the procedure. The purpose is to treat any soreness and discomfort from the procedure.  Food intake: No eating limitations, unless stipulated above.  Nevertheless, if you have had sedation, you may experience some nausea.  In this case, it may be wise to wait at least two hours prior to resuming regular diet.  Physical activities: Keep activities to a minimum for the first 8 hours after the procedure. For the first 24 hours after the procedure, do not drive a motor vehicle,  Operate heavy machinery, power tools, or handle any weapons.  Consider walking with the use of an assistive device or accompanied by an adult for the first 24 hours.  Do not drink alcoholic beverages including beer.  Do not make any important decisions or sign any legal documents. Go home and rest today.  Resume activities tomorrow, as tolerated.  Use caution in moving about as you may experience mild leg weakness.  Use caution in cooking, use of household electrical appliances and climbing steps.  Driving: If you have received any sedation, you are not allowed to drive for 24 hours after your procedure.  Blood thinner: Restart your blood thinner 6 hours after your  procedure. (Only for those taking blood thinners)  Insulin: As soon as you can eat, you may resume your normal dosing schedule. (Only for those taking insulin)  Medications: May resume pre-procedure medications.  Do not take any drugs, other than what has been prescribed to you.  Infection prevention: Keep procedure site clean and dry.  Post-procedure Pain Diary: Extremely important that this be done correctly and accurately. Recorded information will be used to determine the next step in treatment.  Pain evaluated is that of treated area only. Do not include pain from an untreated area.  Complete every hour, on the hour, for the initial 8 hours. Set an alarm to help you do this part accurately.  Do not go to sleep and have it completed later. It will not be accurate.  Follow-up appointment: Keep your follow-up appointment after the procedure. Usually 2-6 weeks after radiofrequency. Bring you pain diary. The information collected will be essential for your long-term care.   Expect:  From numbing medicine (AKA: Local Anesthetics): Numbness or decrease in pain.  Onset: Full effect within 15 minutes of injected.  Duration: It will depend on the type of local anesthetic used. On the average, 1 to 8 hours.   From steroids (when added): Decrease in swelling or inflammation. Once inflammation is improved, relief of the pain will follow.  Onset of benefits: Depends on the amount of swelling present. The more swelling, the longer it will take for the benefits to be seen. In some cases, up to 10 days.  Duration: Steroids will stay in the system x 2 weeks. Duration of benefits will depend on multiple posibilities including persistent irritating factors.    From procedure: Some discomfort is to be expected once the numbing medicine wears off. In the case of radiofrequency procedures, this may last as long as 6 weeks. Additional post-procedure pain medication is provided for this. Discomfort is  minimized if ice and heat are applied as instructed.  Call if:  You experience numbness and weakness that gets worse with time, as opposed to wearing off.  He experience any unusual bleeding, difficulty breathing, or loss of the ability to control your bowel and bladder. (This applies to Spinal procedures only)  You experience any redness, swelling, heat, red streaks, elevated temperature, fever, or any other signs of a possible infection.  Emergency Numbers:  Durning business hours (Monday - Thursday, 8:00 AM - 4:00 PM) (Friday, 9:00 AM - 12:00 Noon): [phone]  After hours: [phone] ____________________________________________________________________________________________   ____________________________________________________________________________________________  Preparing for Procedure with Sedation  Procedure appointments are limited to planned procedures: . No Prescription Refills. . No disability issues will be discussed. . No medication changes will be discussed.  Instructions: . Oral Intake: Do not eat or drink anything for at least 8 hours prior to your procedure. (Exception: Blood Pressure Medication. See below.) . Transportation: Unless otherwise stated by your physician, you may drive yourself after the procedure. . Blood Pressure Medicine: Do not forget to take your blood pressure medicine with a sip of water the morning of the procedure. If your Diastolic (lower reading)is above 100 mmHg, elective cases will be cancelled/rescheduled. . Blood thinners: These will need to be stopped for procedures. Notify our staff if you are taking any blood thinners. Depending on which one you take, there will be specific instructions on how and when to stop it. . Diabetics on insulin: Notify the staff so that you can be scheduled 1st case in the morning. If your diabetes requires high dose insulin, take only  of your normal insulin dose the morning of the procedure and  notify the staff that you have done so. . Preventing infections: Shower with an antibacterial soap the morning of your procedure. . Build-up your immune system: Take 1000 mg of Vitamin C with every meal (3 times a day) the day prior to your procedure. . Antibiotics: Inform the staff if you have a condition or reason that requires you to take antibiotics before dental procedures. . Pregnancy: If you are pregnant, call and cancel the procedure. . Sickness: If you have a cold, fever, or any active infections, call and cancel the procedure. . Arrival: You must be in the facility at least 30 minutes prior to your scheduled procedure. . Children: Do not bring children with you. . Dress appropriately: Bring dark clothing that you would not mind if they get stained. . Valuables: Do not bring any jewelry or valuables.  Reasons to call and reschedule or cancel your procedure: (Following these recommendations will minimize the risk of a serious complication.) . Surgeries: Avoid having procedures within 2 weeks of any surgery. (Avoid for 2 weeks before or after any surgery). . Flu Shots: Avoid having procedures within 2 weeks of a flu shots or . (Avoid for 2 weeks before or after immunizations). . Barium: Avoid having a procedure within 7-10 days after having had a radiological study involving the use of radiological contrast. (Myelograms, Barium swallow or enema study). . Heart attacks: Avoid any elective procedures or surgeries for the initial 6 months after a "Myocardial Infarction" (Heart Attack). . Blood thinners: It is imperative that you stop these medications before procedures. Let   us know if you if you take any blood thinner.  . Infection: Avoid procedures during or within two weeks of an infection (including chest colds or gastrointestinal problems). Symptoms associated with infections include: Localized redness, fever, chills, night sweats or profuse sweating, burning sensation when voiding, cough,  congestion, stuffiness, runny nose, sore throat, diarrhea, nausea, vomiting, cold or Flu symptoms, recent or current infections. It is specially important if the infection is over the area that we intend to treat. . Heart and lung problems: Symptoms that may suggest an active cardiopulmonary problem include: cough, chest pain, breathing difficulties or shortness of breath, dizziness, ankle swelling, uncontrolled high or unusually low blood pressure, and/or palpitations. If you are experiencing any of these symptoms, cancel your procedure and contact your primary care physician for an evaluation.  Remember:  Regular Business hours are:  Monday to Thursday 8:00 AM to 4:00 PM  Provider's Schedule: Alecea Trego, MD:  Procedure days: Tuesday and Thursday 7:30 AM to 4:00 PM  Bilal Lateef, MD:  Procedure days: Monday and Wednesday 7:30 AM to 4:00 PM ____________________________________________________________________________________________    

## 2020-03-14 NOTE — Progress Notes (Signed)
Safety precautions to be maintained throughout the outpatient stay will include: orient to surroundings, keep bed in low position, maintain call bell within reach at all times, provide assistance with transfer out of bed and ambulation.  

## 2020-03-14 NOTE — Progress Notes (Signed)
Anirudh Baiz NOTE: Information contained herein reflects review and annotations entered in association with encounter. Interpretation of such information and data should be left to medically-trained personnel. Information provided to patient can be located elsewhere in the medical record under "Patient Instructions". Document created using STT-dictation technology, any transcriptional errors that may result from process are unintentional.    Patient: Tanya [name]  Service Category: Procedure  Tanya [name]: Gaspar Cola, MD  DOB: 11-Jan-1961  DOS: 03/14/2020  Location: [facility]  MRN: [id number]  Setting: Ambulatory - outpatient  Referring Katelen Luepke: Milinda Pointer, MD  Type: Established Patient  Specialty: Interventional Pain Management  PCP: Henrietta Hoover, MD   Primary Reason for Visit: Interventional Pain Management Treatment. CC: Neck Pain  Procedure:          Anesthesia, Analgesia, Anxiolysis:  Type: Cervical Facet, Medial Branch Radiofrequency Ablation  #3  Primary Purpose: Therapeutic Region: Posterolateral cervicothoracic spine region Level: C4, C5, C6, C7 & C8 Medial Branch Level(s). Lesioning of these levels should completely denervate the C5-6, C6-7, and the C7-T1 cervical facet joints. Laterality: Left Paraspinal  Type: Moderate (Conscious) Sedation combined with Local Anesthesia Indication(s): Analgesia and Anxiety Route: Intravenous (IV) IV Access: Secured Sedation: Meaningful verbal contact was maintained at all times during the procedure  Local Anesthetic: Lidocaine 1-2%  Position: Prone with head of the table was raised to facilitate breathing.   Indications: 1. Cervical facet syndrome (Bilateral) (R>L)   2. Spondylosis without myelopathy or radiculopathy, cervical region   3. Cervicalgia   4. Cervical spondylosis   5. Cervico-occipital neuralgia (Left)   6. DDD (degenerative disc disease), cervical   7. Chronic upper back pain (2ry  area of Pain) (Bilateral) (R>L)    Tanya [name] has been dealing with the above chronic pain for longer than three months and has either failed to respond, was unable to tolerate, or simply did not get enough benefit from other more conservative therapies including, but not limited to: 1. Over-the-counter medications 2. Anti-inflammatory medications 3. Muscle relaxants 4. Membrane stabilizers 5. Opioids 6. Physical therapy and/or chiropractic manipulation 7. Modalities (Heat, ice, etc.) 8. Invasive techniques such as nerve blocks. Tanya [name] has attained more than 50% relief of the pain from a series of diagnostic injections conducted in separate occasions.  Pain Score: Pre-procedure: 7 /10 Post-procedure: 0-No pain/10  Pre-op H&P Assessment:  Tanya [name] is a 60 y.o. (year old), female patient, seen today for interventional treatment. She  has a past surgical history that includes Abdominal hysterectomy; Colonoscopy; Colonoscopy with propofol (N/A, 07/03/2017); Tonsillectomy; Esophagogastroduodenoscopy (N/A, 10/21/2017); and Colonoscopy with propofol (N/A, 10/21/2017). Tanya [name] has a current medication list which includes the following prescription(s): atorvastatin, baclofen, buspirone, duloxetine, gabapentin, hydrocodone-acetaminophen, [START ON 03/21/2020] hydrocodone-acetaminophen, hydroxychloroquine, nicotine step 1, oxycodone, [START ON 04/04/2020] oxycodone, triamcinolone, and oxycodone, and the following Facility-Administered Medications: fentanyl and midazolam. Her primarily concern today is the Neck Pain  Initial Vital Signs:  Pulse/HCG Rate: 76ECG Heart Rate: 65 Temp: (!) 97.3 F (36.3 C) Resp: 17 BP: (!) 148/77 SpO2: 97 %  BMI: Estimated body mass index is 26.75 kg/m as calculated from the following:   Height as of this encounter: 5\' 3"  (1.6 m).   Weight as of this encounter: 151 lb (68.5 kg).  Risk Assessment: Allergies: Reviewed. She is allergic to pregabalin,  trazodone, bupropion, methocarbamol, and tramadol.  Allergy Precautions: None required Coagulopathies: Reviewed. None identified.  Blood-thinner therapy: None at this time Active Infection(s): Reviewed. None identified. Tanya [name] is  afebrile  Site Confirmation: Tanya [name] was asked to confirm the procedure and laterality before marking the site Procedure checklist: Completed Consent: Before the procedure and under the influence of no sedative(s), amnesic(s), or anxiolytics, the patient was informed of the treatment options, risks and possible complications. To fulfill our ethical and legal obligations, as recommended by the American Medical Association's Code of Ethics, I have informed the patient of my clinical impression; the nature and purpose of the treatment or procedure; the risks, benefits, and possible complications of the intervention; the alternatives, including doing nothing; the risk(s) and benefit(s) of the alternative treatment(s) or procedure(s); and the risk(s) and benefit(s) of doing nothing. The patient was provided information about the general risks and possible complications associated with the procedure. These may include, but are not limited to: failure to achieve desired goals, infection, bleeding, organ or nerve damage, allergic reactions, paralysis, and death. In addition, the patient was informed of those risks and complications associated to Spine-related procedures, such as failure to decrease pain; infection (i.e.: Meningitis, epidural or intraspinal abscess); bleeding (i.e.: epidural hematoma, subarachnoid hemorrhage, or any other type of intraspinal or peri-dural bleeding); organ or nerve damage (i.e.: Any type of peripheral nerve, nerve root, or spinal cord injury) with subsequent damage to sensory, motor, and/or autonomic systems, resulting in permanent pain, numbness, and/or weakness of one or several areas of the body; allergic reactions; (i.e.: anaphylactic  reaction); and/or death. Furthermore, the patient was informed of those risks and complications associated with the medications. These include, but are not limited to: allergic reactions (i.e.: anaphylactic or anaphylactoid reaction(s)); adrenal axis suppression; blood sugar elevation that in diabetics may result in ketoacidosis or comma; water retention that in patients with history of congestive heart failure may result in shortness of breath, pulmonary edema, and decompensation with resultant heart failure; weight gain; swelling or edema; medication-induced neural toxicity; particulate matter embolism and blood vessel occlusion with resultant organ, and/or nervous system infarction; and/or aseptic necrosis of one or more joints. Finally, the patient was informed that Medicine is not an exact science; therefore, there is also the possibility of unforeseen or unpredictable risks and/or possible complications that may result in a catastrophic outcome. The patient indicated having understood very clearly. We have given the patient no guarantees and we have made no promises. Enough time was given to the patient to ask questions, all of which were answered to the patient's satisfaction. Tanya [name] has indicated that she wanted to continue with the procedure. Attestation: I, the ordering Haaris Metallo, attest that I have discussed with the patient the benefits, risks, side-effects, alternatives, likelihood of achieving goals, and potential problems during recovery for the procedure that I have provided informed consent. Date  Time: 03/14/2020 10:03 AM  Pre-Procedure Preparation:  Monitoring: As per clinic protocol. Respiration, ETCO2, SpO2, BP, heart rate and rhythm monitor placed and checked for adequate function Safety Precautions: Patient was assessed for positional comfort and pressure points before starting the procedure. Time-out: I initiated and conducted the "Time-out" before starting the procedure, as  per protocol. The patient was asked to participate by confirming the accuracy of the "Time Out" information. Verification of the correct person, site, and procedure were performed and confirmed by me, the nursing staff, and the patient. "Time-out" conducted as per Joint Commission's Universal Protocol (UP.01.01.01). Time: 1058  Description of Procedure:          Laterality: Left Level: C3, C4, C5, C6, & C7 Medial Branch Level(s). Area Prepped: Entire Posterior Cervico-thoracic Region  DuraPrep (Iodine Povacrylex [0.7% available iodine] and Isopropyl Alcohol, 74% w/w) Safety Precautions: Aspiration looking for blood return was conducted prior to all injections. At no point did we inject any substances, as a needle was being advanced. Before injecting, the patient was told to immediately notify me if she was experiencing any new onset of "ringing in the ears, or metallic taste in the mouth". No attempts were made at seeking any paresthesias. Safe injection practices and needle disposal techniques used. Medications properly checked for expiration dates. SDV (single dose vial) medications used. After the completion of the procedure, all disposable equipment used was discarded in the proper designated medical waste containers. Local Anesthesia: Protocol guidelines were followed. The patient was positioned over the fluoroscopy table. The area was prepped in the usual manner. The time-out was completed. The target area was identified using fluoroscopy. A 12-in long, straight, sterile hemostat was used with fluoroscopic guidance to locate the targets for each level blocked. Once located, the skin was marked with an approved surgical skin marker. Once all sites were marked, the skin (epidermis, dermis, and hypodermis), as well as deeper tissues (fat, connective tissue and muscle) were infiltrated with a small amount of a short-acting local anesthetic, loaded on a 10cc syringe with a 25G, 1.5-in  Needle. An  appropriate amount of time was allowed for local anesthetics to take effect before proceeding to the next step. Local Anesthetic: Lidocaine 2.0% The unused portion of the local anesthetic was discarded in the proper designated containers. Technical explanation of process:  Radiofrequency Ablation (RFA) C4 Medial Branch Nerve RFA: The target area for the C4 dorsal medial articular branch is the lateral concave waist of the articular pillar of C4. Under fluoroscopic guidance, a Radiofrequency needle was inserted until contact was made with os over the postero-lateral aspect of the articular pillar of C4 (target area). Sensory and motor testing was conducted to properly adjust the position of the needle. Once satisfactory placement of the needle was achieved, the numbing solution was slowly injected after negative aspiration for blood. 2.0 mL of the nerve block solution was injected without difficulty or complication. After waiting for at least 3 minutes, the ablation was performed. Once completed, the needle was removed intact. C5 Medial Branch Nerve RFA: The target area for the C5 dorsal medial articular branch is the lateral concave waist of the articular pillar of C5. Under fluoroscopic guidance, a Radiofrequency needle was inserted until contact was made with os over the postero-lateral aspect of the articular pillar of C5 (target area). Sensory and motor testing was conducted to properly adjust the position of the needle. Once satisfactory placement of the needle was achieved, the numbing solution was slowly injected after negative aspiration for blood. 2.0 mL of the nerve block solution was injected without difficulty or complication. After waiting for at least 3 minutes, the ablation was performed. Once completed, the needle was removed intact. C6 Medial Branch Nerve RFA: The target area for the C6 dorsal medial articular branch is the lateral concave waist of the articular pillar of C6. Under  fluoroscopic guidance, a Radiofrequency needle was inserted until contact was made with os over the postero-lateral aspect of the articular pillar of C6 (target area). Sensory and motor testing was conducted to properly adjust the position of the needle. Once satisfactory placement of the needle was achieved, the numbing solution was slowly injected after negative aspiration for blood. 2.0 mL of the nerve block solution was injected without difficulty or complication. After waiting for  at least 3 minutes, the ablation was performed. Once completed, the needle was removed intact. C7 Medial Branch Nerve RFA: The target area for the C7 dorsal medial articular branch is the lateral concave waist of the articular pillar of C7. Under fluoroscopic guidance, a Radiofrequency needle was inserted until contact was made with os over the postero-lateral aspect of the articular pillar of C7 (target area). Sensory and motor testing was conducted to properly adjust the position of the needle. Once satisfactory placement of the needle was achieved, the numbing solution was slowly injected after negative aspiration for blood. 2.0 mL of the nerve block solution was injected without difficulty or complication. After waiting for at least 3 minutes, the ablation was performed. Once completed, the needle was removed intact. C8 Medial Branch Nerve RFA: The target for the C8 dorsal medial articular branch lies on the superior-lateral border of the T1 transverse process. Under fluoroscopic guidance, a Radiofrequency needle was inserted until contact was made with os over the superior-lateral aspect of the transverse process of T1 (target area). Sensory and motor testing was conducted to properly adjust the position of the needle. Once satisfactory placement of the needle was achieved, the numbing solution was slowly injected after negative aspiration for blood. 2.0 mL of the nerve block solution was injected without difficulty or  complication. After waiting for at least 3 minutes, the ablation was performed. Once completed, the needle was removed intact. Radiofrequency lesioning (ablation):  Radiofrequency Generator: NeuroTherm [id number] Sensory Stimulation Parameters: 50 Hz was used to locate & identify the nerve, making sure that the needle was positioned such that there was no sensory stimulation below 0.3 V or above 0.7 V. Motor Stimulation Parameters: 2 Hz was used to evaluate the motor component. Care was taken not to lesion any nerves that demonstrated motor stimulation of the lower extremities at an output of less than 2.5 times that of the sensory threshold, or a maximum of 2.0 V. Lesioning Technique Parameters: Standard Radiofrequency settings. (Not bipolar or pulsed.) Temperature Settings: 80 degrees C Lesioning time: 60 seconds Intra-operative Compliance: Compliant Materials & Medications: Needle(s) (Electrode/Cannula) Type: Teflon-coated, curved tip, Radiofrequency needle(s) Gauge: 22G Length: 10cm Numbing solution: 0.2% PF-Ropivacaine + Triamcinolone (40 mg/mL) diluted to a final concentration of 4 mg of Triamcinolone/mL of Ropivacaine The unused portion of the solution was discarded in the proper designated containers.  Once the entire procedure was completed, the treated area was cleaned, making sure to leave some of the prepping solution back to take advantage of its long term bactericidal properties. Intra-operative Compliance: Compliant  Anatomy Reference Guide:          Vitals:   03/14/20 1122 03/14/20 1132 03/14/20 1142 03/14/20 1152  BP: 110/79 (!) 142/66 129/67 124/64  Pulse:      Resp: (!) 23 20 18 16   Temp:  (!) 97.1 F (36.2 C)  (!) 97.1 F (36.2 C)  SpO2: 98% 100% 100% 100%  Weight:      Height:        Start Time: 1058 hrs. End Time: 1122 hrs.  Imaging Guidance (Spinal):          Type of Imaging Technique: Fluoroscopy Guidance (Spinal) Indication(s): Assistance in needle  guidance and placement for procedures requiring needle placement in or near specific anatomical locations not easily accessible without such assistance. Exposure Time: Please see nurses notes. Contrast: None used. Fluoroscopic Guidance: I was personally present during the use of fluoroscopy. "Tunnel Vision Technique" used to obtain the best possible  view of the target area. Parallax error corrected before commencing the procedure. "Direction-depth-direction" technique used to introduce the needle under continuous pulsed fluoroscopy. Once target was reached, antero-posterior, oblique, and lateral fluoroscopic projection used confirm needle placement in all planes. Images permanently stored in EMR. Interpretation: No contrast injected. I personally interpreted the imaging intraoperatively. Adequate needle placement confirmed in multiple planes. Permanent images saved into the patient's record.  Antibiotic Prophylaxis:   Anti-infectives (From admission, onward)   None     Indication(s): None identified  Post-operative Assessment:  Post-procedure Vital Signs:  Pulse/HCG Rate: 7672 Temp: (!) 97.1 F (36.2 C) Resp: 16 BP: 124/64 SpO2: 100 %  EBL: None  Complications: No immediate post-treatment complications observed by team, or reported by patient.  Note: The patient tolerated the entire procedure well. A repeat set of vitals were taken after the procedure and the patient was kept under observation following institutional policy, for this type of procedure. Post-procedural neurological assessment was performed, showing return to baseline, prior to discharge. The patient was provided with post-procedure discharge instructions, including a section on how to identify potential problems. Should any problems arise concerning this procedure, the patient was given instructions to immediately contact us, at any time, without hesitation. In any case, we plan to contact the patient by telephone for a  follow-up status report regarding this interventional procedure.  Comments:  No additional relevant information.  Plan of Care  Orders:  Orders Placed This Encounter  Procedures  . Radiofrequency,Cervical    Scheduling Instructions:     Side(s): Left-sided     Level(s): C3, C4, C5, C6, & C7 Medial Branch Nerve(s)     Sedation: With Sedation.     Timeframe: Today    Order Specific Question:   Where will this procedure be performed?    Answer:   [facility]  . Radiofrequency,Cervical    Standing Status:   Future    Standing Expiration Date:   07/12/2020    Scheduling Instructions:     Side(s): Right-sided     Level(s): C3, C4, C5, C6, & C7 Medial Branch Nerve(s)     Sedation: With Sedation.     Scheduling Timeframe: 2 weeks from now    Order Specific Question:   Where will this procedure be performed?    Answer:   [facility]  . DG PA[facility] C-ARM 1-60 MIN NO REPORT    Intraoperative interpretation by procedural physician at [place].    Standing Status:   Standing    Number of Occurrences:   1    Order Specific Question:   Reason for exam:    Answer:   Assistance in needle guidance and placement for procedures requiring needle placement in or near specific anatomical locations not easily accessible without such assistance.  . Informed Consent Details: Physician/Practitioner Attestation; Transcribe to consent form and obtain patient signature    Nursing Order: Transcribe to consent form and obtain patient signature. Note: Always confirm laterality of pain with Tanya [name], before procedure.    Order Specific Question:   Physician/Practitioner attestation of informed consent for procedure/surgical case    Answer:   I, the physician/practitioner, attest that I have discussed with the patient the benefits, risks, side effects, alternatives, likelihood of achieving goals and potential problems during recovery for the procedure that I have provided  informed consent.    Order Specific Question:   Procedure    Answer:   Cervical Facet Radiofrequency Ablation  Order Specific Question:   Physician/Practitioner performing the procedure    Answer:    A. Dossie Arbour, MD    Order Specific Question:   Indication/Reason    Answer:   Neck Pain (Cervicalgia), with our without referred arm pain, due to Facet Joint Arthralgia (Joint Pain) known as Cervical Facet Syndrome, secondary to Cervical, and/or Cervico-thoracic Spondylosis (Arthritis of the Spine), without myelopathy or radiculop  . Provide equipment / supplies at bedside    "Radiofrequency Tray"; Large hemostat (x1); Small hemostat (x1); Towels (x8); 4x4 sterile sponge pack (x1) Needle type: Teflon-coated Radiofrequency Needle (Disposable  single use) Size: Regular Quantity: 5    Standing Status:   Standing    Number of Occurrences:   1    Order Specific Question:   Specify    Answer:   Radiofrequency Tray   Chronic Opioid Analgesic:  Oxycodone IR 5 mg, 1 tab PO BID (10 mg/day of oxycodone) MME/day: 15 mg/day.   Medications ordered for procedure: Meds ordered this encounter  Medications  . lidocaine (XYLOCAINE) 2 % (with pres) injection 400 mg  . lactated ringers infusion 1,000 mL  . midazolam (VERSED) 5 MG/5ML injection 1-2 mg    Make sure Flumazenil is available in the pyxis when using this medication. If oversedation occurs, administer 0.2 mg IV over 15 sec. If after 45 sec no response, administer 0.2 mg again over 1 min; may repeat at 1 min intervals; not to exceed 4 doses (1 mg)  . fentaNYL (SUBLIMAZE) injection 25-50 mcg    Make sure Narcan is available in the pyxis when using this medication. In the event of respiratory depression (RR< 8/min): Titrate NARCAN (naloxone) in increments of 0.1 to 0.2 mg IV at 2-3 minute intervals, until desired degree of reversal.  . ropivacaine (PF) 2 mg/mL (0.2%) (NAROPIN) injection 9 mL  . dexamethasone (DECADRON) injection 10 mg  .  HYDROcodone-acetaminophen (NORCO/VICODIN) 5-325 MG tablet    Sig: Take 1 tablet by mouth every 8 (eight) hours as needed for up to 7 days for severe pain. Must last 7 days.    Dispense:  21 tablet    Refill:  0    For acute post-operative pain. Not to be refilled. Must last 7 days.  Marland Kitchen HYDROcodone-acetaminophen (NORCO/VICODIN) 5-325 MG tablet    Sig: Take 1 tablet by mouth every 8 (eight) hours as needed for up to 7 days for severe pain. Must last for 7 days.    Dispense:  21 tablet    Refill:  0    For acute post-operative pain. Not to be refilled. Must last 7 days.   Medications administered: We administered lidocaine, lactated ringers, midazolam, fentaNYL, ropivacaine (PF) 2 mg/mL (0.2%), and dexamethasone.  See the medical record for exact dosing, route, and time of administration.  Follow-up plan:   Return in about 2 weeks (around 03/28/2020) for RFA (58min): (R) C-FCT RFA #3.      Interventional Therapies  Risk  Complexity Considerations:   WNL   Planned  Pending:   Therapeutic/palliative left cervical facet RFA #3 (last done 10/23/2017)    Under consideration:   Diagnostic right suprascapular NB Diagnostic bilateral lumbar facet block   Completed:   Palliative right glenohumeral joint injection x2  Palliative right AC joint injection x3  Palliative left medial epicondyle elbow injection x1  Diagnostic left GONB x1  Palliative right CESI(Series x1) Palliative bilateral cervical facet block  Palliative right cervical facet RFAx2(12/24/2016) Palliative left cervical facet RFA x2 (10/23/2017)  Therapeutic  Palliative (PRN) options:   Palliative right glenohumeral joint injection #3  Palliative right AC joint injection #4  Palliative left medial epicondyle elbow injection #2  Diagnostic left GONB #2  Palliative right CESI(Series #2) Palliative bilateral cervical facet block  Palliative right cervical facet RFA#3 Palliative left cervical facet RFA #3      Recent Visits Date Type Amrutha [name] Dept  01/17/20 Office Visit Milinda Pointer, MD [facility]  12/30/19 Procedure visit Milinda Pointer, MD [facility]  12/22/19 Office Visit Milinda Pointer, MD [facility]  Showing recent visits within past 90 days and meeting all other requirements Today's Visits Date Type Tanya [name] Dept  03/14/20 Procedure visit Milinda Pointer, MD [facility]  Showing today's visits and meeting all other requirements Future Appointments Date Type Tanya [name] Dept  04/03/20 Appointment Milinda Pointer, MD [facility]  Showing future appointments within next 90 days and meeting all other requirements  Disposition: Discharge home  Discharge (Date  Time): 03/14/2020; 1157 hrs.   Primary Care Physician: Henrietta Hoover, MD Location: [facility] Note by: Gaspar Cola, MD Date: 03/14/2020; Time: 12:17 PM  Disclaimer:  Medicine is not an Chief Strategy Officer. The only guarantee in medicine is that nothing is guaranteed. It is important to note that the decision to proceed with this intervention was based on the information collected from the patient. The Data and conclusions were drawn from the patient's questionnaire, the interview, and the physical examination. Because the information was provided in large part by the patient, it cannot be guaranteed that it has not been purposely or unconsciously manipulated. Every effort has been made to obtain as much relevant data as possible for this evaluation. It is important to note that the conclusions that lead to this procedure are derived in large part from the available data. Always take into account that the treatment will also be dependent on availability of resources and existing treatment guidelines, considered by other Pain Management Practitioners as being common knowledge and practice, at the time of the intervention. For  Medico-Legal purposes, it is also important to point out that variation in procedural techniques and pharmacological choices are the acceptable norm. The indications, contraindications, technique, and results of the above procedure should only be interpreted and judged by a Board-Certified Interventional Pain Specialist with extensive familiarity and expertise in the same exact procedure and technique.

## 2020-03-15 ENCOUNTER — Telehealth: Payer: Self-pay

## 2020-03-15 NOTE — Telephone Encounter (Signed)
Post procedure phone call.  Patient states she is doing fine.  

## 2020-04-01 NOTE — Progress Notes (Signed)
PROVIDER NOTE: Information contained herein reflects review and annotations entered in association with encounter. Interpretation of such information and data should be left to medically-trained personnel. Information provided to patient can be located elsewhere in the medical record under "Patient Instructions". Document created using STT-dictation technology, any transcriptional errors that may result from process are unintentional.    Patient: Tanya [name]  Service Category: E/M  Provider: Gaspar Cola, MD  DOB: [birth year]  DOS: 04/03/2020  Specialty: Interventional Pain Management  MRN: [id number]  Setting: Ambulatory outpatient  PCP: Tanya Hoover, MD  Type: Established Patient    Referring Provider: Lorenza [name]*  Location: Office  Delivery: Face-to-face     HPI  [name], a 60 y.o. year old female, is here today because of her Chronic pain syndrome [G89.4]. [name] primary complain today is Neck Pain Last encounter: My last encounter with her was on 03/14/2020. Pertinent problems: [name] has Chronic upper back pain (2ry area of Pain) (Bilateral) (R>L); Chronic low back pain (3ry area of Pain) (Bilateral) (R>L); Chronic neck pain (1ry area of Pain) (Bilateral) (R>L); Fibromyalgia; Osteoarthritis; Chronic pain syndrome; Chronic upper extremity pain (Right); Chronic cervical radicular pain (Right); Chronic shoulder pain (Bilateral) (R>L); Chronic musculoskeletal pain; Cervical spondylosis; Cervical facet syndrome (Bilateral) (R>L); Shoulder radicular pain (Bilateral) (R>L); Acute postoperative pain; Spondylosis without myelopathy or radiculopathy, cervical region; Cervicalgia; Occipital headache (Left); Neurogenic pain; Cervico-occipital neuralgia (Left); Chronic elbow pain (Left); Osteoarthritis of AC (acromioclavicular) joint (Right); Chronic acromioclavicular joint pain (Right); Chronic shoulder pain (Right); Epicondylitis elbow,  medial (Left); 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 Pain Assessment: Severity of Chronic pain is reported as a 10-Worst pain ever/10. Location: Neck Lower/denies. Onset: More than a month ago. Quality: Burning,Constant. Timing: Constant. Modifying factor(s): medications, rest, positioning with arms down.. Vitals:  height is _0  (1.6 m) and weight is 151 lb (68.5 kg). Her temporal temperature is 97 F (36.1 C) (abnormal). Her blood pressure is 119/81 and her pulse is 92. Her respiration is 16 and oxygen saturation is 98%.   Reason for encounter: medication management.   The patient indicates doing well with the current medication regimen. No adverse reactions or side effects reported to the medications.   RTCB: 07/04/2020 Nonopioids transfer 12/22/2019: Baclofen and Neurontin  Pharmacotherapy Assessment   Analgesic: Oxycodone IR 5 mg, 1 tab PO BID (10 mg/day of oxycodone) MME/day: 15 mg/day.   Monitoring: [place] PMP: PDMP reviewed during this encounter.       Pharmacotherapy: No side-effects or adverse reactions reported. Compliance: No problems identified. Effectiveness: Clinically acceptable.  [name], RN  04/03/2020 12:18 PM  Sign when Signing Visit Nursing Pain Medication Assessment:  Safety precautions to be maintained throughout the outpatient stay will include: orient to surroundings, keep bed in low position, maintain call bell within reach at all times, provide assistance with transfer out of bed and ambulation.  Medication Inspection Compliance: Pill count conducted under aseptic conditions, in front of the patient. Neither the pills nor the bottle was removed from the patient's sight at any time. Once count was completed pills were immediately returned to the patient in their original bottle.  Medication: Oxycodone  IR Pill/Patch Count: No pills available to be counted. Pill/Patch Appearance: No markings Bottle Appearance: No container available. Did not bring bottle(s) to appointment. Filled Date: ? / ? / 2022 Last  Medication intake:  Ran out of medicine more than 48 hours ago    UDS:  Summary  Date Value Ref Range Status  07/01/2019 Note  Final    Comment:    ==================================================================== [employer] [facility] 13 (MW) ==================================================================== Specimen Alert Note: Urinary creatinine is low; ability to detect some drugs may be compromised. Interpret results with caution. (Creatinine) ==================================================================== Test                             Result       Flag       Units  Drug Present and Declared for Prescription Verification   Oxycodone                      1113         EXPECTED   ng/mg creat   Oxymorphone                    773          EXPECTED   ng/mg creat   Noroxycodone                   1840         EXPECTED   ng/mg creat    Sources of oxycodone include scheduled prescription medications.    Oxymorphone and noroxycodone are expected metabolites of oxycodone.    Oxymorphone is also available as a scheduled prescription medication.  ==================================================================== Test                      Result    Flag   Units      Ref Range   Creatinine              15        LL     mg/dL      >=47 ==================================================================== Declared Medications:  The flagging and interpretation on this report are based on the  following declared medications.  Unexpected results may arise from  inaccuracies in the declared medications.   **Note: The testing scope of this panel includes these medications:   Oxycodone (Roxicodone)   **Note: The testing scope of this panel does not include the  following reported  medications:   Albuterol (Ventolin HFA)  Atorvastatin (Lipitor)  Cyclobenzaprine (Flexeril)  Doxycycline (Doryx)  Duloxetine (Cymbalta)  Gabapentin (Neurontin)  Mirtazapine (Remeron)  Pantoprazole (Protonix)  Pregabalin (Lyrica)  Triamcinolone (Kenalog) ==================================================================== For clinical consultation, please call [phone]. ====================================================================      ROS  Constitutional: Denies any fever or chills Gastrointestinal: No reported hemesis, hematochezia, vomiting, or acute GI distress Musculoskeletal: Denies any acute onset joint swelling, redness, loss of ROM, or weakness Neurological: No reported episodes of acute onset apraxia, aphasia, dysarthria, agnosia, amnesia, paralysis, loss of coordination, or loss of consciousness  Medication Review  DULoxetine, atorvastatin, baclofen, busPIRone, gabapentin, hydroxychloroquine, nicotine, and oxyCODONE  History Review  Allergy: Ms. Rayman is allergic to pregabalin, trazodone, bupropion, methocarbamol, and tramadol. Drug: Ms. Tague  reports no history of drug use. Alcohol:  reports no history of alcohol use. Tobacco:  reports that she has quit smoking. Her smoking use included cigarettes. She has a 17.50 pack-year smoking history. She has never used smokeless tobacco. Social: Ms. Koehne  reports that she has quit smoking. Her smoking use included cigarettes. She has a 17.50 pack-year smoking history. She has never used smokeless tobacco. She reports that she  does not drink alcohol and does not use drugs. Medical:  has a past medical history of Anxiety, Bladder infection, Chronic lower back pain, Chronic neck pain, Collagen vascular disease ([place]), Depression, Diverticulitis, Hyperlipidemia, Lupus ([place]), and Overactive bladder. Surgical: Ms. Sane  has a past surgical history that includes Abdominal hysterectomy; Colonoscopy;  Colonoscopy with propofol (N/A, 07/03/2017); Tonsillectomy; Esophagogastroduodenoscopy (N/A, 10/21/2017); and Colonoscopy with propofol (N/A, 10/21/2017). Family: family history includes Cancer in her mother; Diabetes in her brother; Emphysema in her mother; Glaucoma in her father; Heart disease in her father; Hypertension in her mother; Stroke in her mother.  Laboratory Chemistry Profile   Renal Lab Results  Component Value Date   BUN 7 06/09/2019   CREATININE 0.72 06/09/2019   BCR 15 09/03/2017   GFRAA >60 06/09/2019   GFRNONAA >60 06/09/2019     Hepatic Lab Results  Component Value Date   AST 26 06/09/2019   ALT 20 06/09/2019   ALBUMIN 4.1 06/09/2019   ALKPHOS 29 (L) 06/09/2019   LIPASE 35 06/09/2019     Electrolytes Lab Results  Component Value Date   NA 135 06/09/2019   K 4.2 06/09/2019   CL 102 06/09/2019   CALCIUM 8.4 (L) 06/09/2019   MG 1.9 09/03/2017     Bone Lab Results  Component Value Date   25OHVITD1 25 (L) 09/03/2017   25OHVITD2 2.9 09/03/2017   25OHVITD3 22 09/03/2017     Inflammation (CRP: Acute Phase) (ESR: Chronic Phase) Lab Results  Component Value Date   CRP 2 09/03/2017   ESRSEDRATE 21 09/03/2017       Note: Above Lab results reviewed.  Recent Imaging Review  DG PAIN [facility]IN[facility] C-ARM 1-60 MIN NO REPORT Fluoro was used, but no Radiologist interpretation will be provided.  Please refer to "NOTES" tab for provider progress note. Note: Reviewed        Physical Exam  General appearance: Well nourished, well developed, and well hydrated. In no apparent acute distress Mental status: Alert, oriented x 3 (person, place, & time)       Respiratory: No evidence of acute respiratory distress Eyes: PERLA Vitals: BP 119/81 (BP Location: Left Arm, Patient Position: Sitting, Cuff Size: Large)   Pulse 92   Temp (!) 97 F (36.1 C) (Temporal)   Resp 16   Ht $R'5\' 3"'Jq$  (1.6 m)   Wt 151 lb (68.5 kg)   SpO2 98%   BMI 26.75 kg/m  BMI: Estimated body mass index  is 26.75 kg/m as calculated from the following:   Height as of this encounter: $RemoveBeforeD'5\' 3"'oCFcQllbTHPIkK$  (1.6 m).   Weight as of this encounter: 151 lb (68.5 kg). Ideal: Ideal body weight: 52.4 kg (115 lb 8.3 oz) Adjusted ideal body weight: 58.8 kg (129 lb 11.4 oz)  Assessment   Status Diagnosis  Controlled Controlled Controlled 1. Chronic pain syndrome   2. Cervicalgia   3. Cervical facet syndrome (Bilateral) (R>L)   4. Cervico-occipital neuralgia (Left)   5. Chronic upper back pain (2ry area of Pain) (Bilateral) (R>L)   6. Chronic shoulder pain (Bilateral) (R>L)   7. Pharmacologic therapy   8. Uncomplicated opioid dependence ([place])      Updated Problems: No problems updated.  Plan of Care  Problem-specific:  No problem-specific Assessment & Plan notes found for this encounter.  Ms. KIMMI ACOCELLA has a current medication list which includes the following long-term medication(s): gabapentin, [START ON 04/04/2020] oxycodone, baclofen, [START ON 05/05/2020] oxycodone, and [START ON 06/04/2020] oxycodone.  Pharmacotherapy (Medications Ordered):  Meds ordered this encounter  Medications  . oxyCODONE (OXY IR/ROXICODONE) 5 MG immediate release tablet    Sig: Take 1 tablet (5 mg total) by mouth 2 (two) times daily. Must last 30 days    Dispense:  60 tablet    Refill:  0    Chronic Pain: STOP Act (Not applicable) Fill 1 day early if closed on refill date. Avoid benzodiazepines within 8 hours of opioids  . oxyCODONE (OXY IR/ROXICODONE) 5 MG immediate release tablet    Sig: Take 1 tablet (5 mg total) by mouth 2 (two) times daily. Must last 30 days    Dispense:  60 tablet    Refill:  0    Chronic Pain: STOP Act (Not applicable) Fill 1 day early if closed on refill date. Avoid benzodiazepines within 8 hours of opioids   Orders:  Orders Placed This Encounter  Procedures  . Radiofrequency,Cervical    Standing Status:   Future    Standing Expiration Date:   08/03/2020    Scheduling Instructions:      Side(s): Right-sided     Level(s): C3, C4, C5, C6, & C7 Medial Branch Nerve(s)     Sedation: Patient's choice.     Scheduling Timeframe: As soon as pre-approved    Order Specific Question:   Where will this procedure be performed?    Answer:   [facility]   Follow-up plan:   Return in about 3 months (around 07/04/2020) for (F2F), (MM), in addition, RFA (56min): (R) C-FCT RFA #3 (ASAP).      Interventional Therapies  Risk  Complexity Considerations:   WNL   Planned  Pending:   Therapeutic/palliative left cervical facet RFA #3 (last done 10/23/2017)    Under consideration:   Diagnostic right suprascapular NB Diagnostic bilateral lumbar facet block   Completed:   Palliative right glenohumeral joint injection x2  Palliative right AC joint injection x3  Palliative left medial epicondyle elbow injection x1  Diagnostic left GONB x1  Palliative right CESI(Series x1) Palliative bilateral cervical facet block  Palliative right cervical facet RFAx2(12/24/2016) Palliative left cervical facet RFA x2 (10/23/2017)   Therapeutic  Palliative (PRN) options:   Palliative right glenohumeral joint injection #3  Palliative right AC joint injection #4  Palliative left medial epicondyle elbow injection #2  Diagnostic left GONB #2  Palliative right CESI(Series #2) Palliative bilateral cervical facet block  Palliative right cervical facet RFA#3 Palliative left cervical facet RFA #3      Recent Visits Date Type Provider Dept  03/14/20 Procedure visit Milinda Pointer, MD [facility]  01/17/20 Office Visit Milinda Pointer, MD [facility]  Showing recent visits within past 90 days and meeting all other requirements Today's Visits Date Type Provider Dept  04/03/20 Office Visit Milinda Pointer, MD [facility]  Showing today's visits and meeting all other requirements Future Appointments Date Type Provider Dept  04/13/20 Appointment Milinda Pointer, MD [facility]  06/26/20 Appointment Milinda Pointer, MD [facility]  Showing future appointments within next 90 days and meeting all other requirements  I discussed the assessment and treatment plan with the patient. The patient was provided an opportunity to ask questions and all were answered. The patient agreed with the plan and demonstrated an understanding of the instructions.  Patient advised to call back or seek an in-person evaluation if the symptoms or condition worsens.  Duration of encounter: 30 minutes.  Note by: Tanya Cola, MD Date: 04/03/2020; Time: 2:46 PM

## 2020-04-03 ENCOUNTER — Other Ambulatory Visit: Payer: Self-pay

## 2020-04-03 ENCOUNTER — Encounter: Payer: Self-pay | Admitting: Pain Medicine

## 2020-04-03 ENCOUNTER — Ambulatory Visit: Payer: Medicaid Other | Attending: Pain Medicine | Admitting: Pain Medicine

## 2020-04-03 VITALS — BP 119/81 | HR 92 | Temp 97.0°F | Resp 16 | Ht 63.0 in | Wt 151.0 lb

## 2020-04-03 DIAGNOSIS — M25512 Pain in left shoulder: Secondary | ICD-10-CM | POA: Diagnosis present

## 2020-04-03 DIAGNOSIS — Z79899 Other long term (current) drug therapy: Secondary | ICD-10-CM | POA: Diagnosis present

## 2020-04-03 DIAGNOSIS — G8929 Other chronic pain: Secondary | ICD-10-CM

## 2020-04-03 DIAGNOSIS — G894 Chronic pain syndrome: Secondary | ICD-10-CM | POA: Diagnosis not present

## 2020-04-03 DIAGNOSIS — M542 Cervicalgia: Secondary | ICD-10-CM | POA: Diagnosis not present

## 2020-04-03 DIAGNOSIS — M25511 Pain in right shoulder: Secondary | ICD-10-CM | POA: Diagnosis present

## 2020-04-03 DIAGNOSIS — M5481 Occipital neuralgia: Secondary | ICD-10-CM

## 2020-04-03 DIAGNOSIS — M47812 Spondylosis without myelopathy or radiculopathy, cervical region: Secondary | ICD-10-CM

## 2020-04-03 DIAGNOSIS — M549 Dorsalgia, unspecified: Secondary | ICD-10-CM

## 2020-04-03 DIAGNOSIS — F1120 Opioid dependence, uncomplicated: Secondary | ICD-10-CM

## 2020-04-03 MED ORDER — OXYCODONE HCL 5 MG PO TABS: 5.0000 mg | ORAL_TABLET | Freq: Two times a day (BID) | ORAL | 0 refills | Status: DC

## 2020-04-03 MED ORDER — OXYCODONE HCL 5 MG PO TABS
5.0000 mg | ORAL_TABLET | Freq: Two times a day (BID) | ORAL | 0 refills | Status: DC
Start: 2020-06-04 — End: 2020-07-12

## 2020-04-03 NOTE — Progress Notes (Signed)
Nursing Pain Medication Assessment:  Safety precautions to be maintained throughout the outpatient stay will include: orient to surroundings, keep bed in low position, maintain call bell within reach at all times, provide assistance with transfer out of bed and ambulation.  Medication Inspection Compliance: Pill count conducted under aseptic conditions, in front of the patient. Neither the pills nor the bottle was removed from the patient's sight at any time. Once count was completed pills were immediately returned to the patient in their original bottle.  Medication: Oxycodone IR Pill/Patch Count: No pills available to be counted. Pill/Patch Appearance: No markings Bottle Appearance: No container available. Did not bring bottle(s) to appointment. Filled Date: ? / ? / 2022 Last Medication intake:  Ran out of medicine more than 48 hours ago

## 2020-04-03 NOTE — Patient Instructions (Signed)
____________________________________________________________________________________________  Preparing for Procedure with Sedation  Procedure appointments are limited to planned procedures: . No Prescription Refills. . No disability issues will be discussed. . No medication changes will be discussed.  Instructions: . Oral Intake: Do not eat or drink anything for at least 8 hours prior to your procedure. (Exception: Blood Pressure Medication. See below.) . Transportation: Unless otherwise stated by your physician, you may drive yourself after the procedure. . Blood Pressure Medicine: Do not forget to take your blood pressure medicine with a sip of water the morning of the procedure. If your Diastolic (lower reading)is above 100 mmHg, elective cases will be cancelled/rescheduled. . Blood thinners: These will need to be stopped for procedures. Notify our staff if you are taking any blood thinners. Depending on which one you take, there will be specific instructions on how and when to stop it. . Diabetics on insulin: Notify the staff so that you can be scheduled 1st case in the morning. If your diabetes requires high dose insulin, take only  of your normal insulin dose the morning of the procedure and notify the staff that you have done so. . Preventing infections: Shower with an antibacterial soap the morning of your procedure. . Build-up your immune system: Take 1000 mg of Vitamin C with every meal (3 times a day) the day prior to your procedure. . Antibiotics: Inform the staff if you have a condition or reason that requires you to take antibiotics before dental procedures. . Pregnancy: If you are pregnant, call and cancel the procedure. . Sickness: If you have a cold, fever, or any active infections, call and cancel the procedure. . Arrival: You must be in the facility at least 30 minutes prior to your scheduled procedure. . Children: Do not bring children with you. . Dress appropriately:  Bring dark clothing that you would not mind if they get stained. . Valuables: Do not bring any jewelry or valuables.  Reasons to call and reschedule or cancel your procedure: (Following these recommendations will minimize the risk of a serious complication.) . Surgeries: Avoid having procedures within 2 weeks of any surgery. (Avoid for 2 weeks before or after any surgery). . Flu Shots: Avoid having procedures within 2 weeks of a flu shots or . (Avoid for 2 weeks before or after immunizations). . Barium: Avoid having a procedure within 7-10 days after having had a radiological study involving the use of radiological contrast. (Myelograms, Barium swallow or enema study). . Heart attacks: Avoid any elective procedures or surgeries for the initial 6 months after a "Myocardial Infarction" (Heart Attack). . Blood thinners: It is imperative that you stop these medications before procedures. Let us know if you if you take any blood thinner.  . Infection: Avoid procedures during or within two weeks of an infection (including chest colds or gastrointestinal problems). Symptoms associated with infections include: Localized redness, fever, chills, night sweats or profuse sweating, burning sensation when voiding, cough, congestion, stuffiness, runny nose, sore throat, diarrhea, nausea, vomiting, cold or Flu symptoms, recent or current infections. It is specially important if the infection is over the area that we intend to treat. . Heart and lung problems: Symptoms that may suggest an active cardiopulmonary problem include: cough, chest pain, breathing difficulties or shortness of breath, dizziness, ankle swelling, uncontrolled high or unusually low blood pressure, and/or palpitations. If you are experiencing any of these symptoms, cancel your procedure and contact your primary care physician for an evaluation.  Remember:  Regular Business hours are:    Monday to Thursday 8:00 AM to 4:00 PM  Provider's  Schedule: Chaniqua Brisby, MD:  Procedure days: Tuesday and Thursday 7:30 AM to 4:00 PM  Bilal Lateef, MD:  Procedure days: Monday and Wednesday 7:30 AM to 4:00 PM ____________________________________________________________________________________________   ____________________________________________________________________________________________  General Risks and Possible Complications  Patient Responsibilities: It is important that you read this as it is part of your informed consent. It is our duty to inform you of the risks and possible complications associated with treatments offered to you. It is your responsibility as a patient to read this and to ask questions about anything that is not clear or that you believe was not covered in this document.  Patient's Rights: You have the right to refuse treatment. You also have the right to change your mind, even after initially having agreed to have the treatment done. However, under this last option, if you wait until the last second to change your mind, you may be charged for the materials used up to that point.  Introduction: Medicine is not an exact science. Everything in Medicine, including the lack of treatment(s), carries the potential for danger, harm, or loss (which is by definition: Risk). In Medicine, a complication is a secondary problem, condition, or disease that can aggravate an already existing one. All treatments carry the risk of possible complications. The fact that a side effects or complications occurs, does not imply that the treatment was conducted incorrectly. It must be clearly understood that these can happen even when everything is done following the highest safety standards.  No treatment: You can choose not to proceed with the proposed treatment alternative. The "PRO(s)" would include: avoiding the risk of complications associated with the therapy. The "CON(s)" would include: not getting any of the treatment  benefits. These benefits fall under one of three categories: diagnostic; therapeutic; and/or palliative. Diagnostic benefits include: getting information which can ultimately lead to improvement of the disease or symptom(s). Therapeutic benefits are those associated with the successful treatment of the disease. Finally, palliative benefits are those related to the decrease of the primary symptoms, without necessarily curing the condition (example: decreasing the pain from a flare-up of a chronic condition, such as incurable terminal cancer).  General Risks and Complications: These are associated to most interventional treatments. They can occur alone, or in combination. They fall under one of the following six (6) categories: no benefit or worsening of symptoms; bleeding; infection; nerve damage; allergic reactions; and/or death. 1. No benefits or worsening of symptoms: In Medicine there are no guarantees, only probabilities. No healthcare provider can ever guarantee that a medical treatment will work, they can only state the probability that it may. Furthermore, there is always the possibility that the condition may worsen, either directly, or indirectly, as a consequence of the treatment. 2. Bleeding: This is more common if the patient is taking a blood thinner, either prescription or over the counter (example: Goody Powders, Fish oil, Aspirin, Garlic, etc.), or if suffering a condition associated with impaired coagulation (example: Hemophilia, cirrhosis of the liver, low platelet counts, etc.). However, even if you do not have one on these, it can still happen. If you have any of these conditions, or take one of these drugs, make sure to notify your treating physician. 3. Infection: This is more common in patients with a compromised immune system, either due to disease (example: diabetes, cancer, human immunodeficiency virus [HIV], etc.), or due to medications or treatments (example: therapies used to treat  cancer and   rheumatological diseases). However, even if you do not have one on these, it can still happen. If you have any of these conditions, or take one of these drugs, make sure to notify your treating physician. 4. Nerve Damage: This is more common when the treatment is an invasive one, but it can also happen with the use of medications, such as those used in the treatment of cancer. The damage can occur to small secondary nerves, or to large primary ones, such as those in the spinal cord and brain. This damage may be temporary or permanent and it may lead to impairments that can range from temporary numbness to permanent paralysis and/or brain death. 5. Allergic Reactions: Any time a substance or material comes in contact with our body, there is the possibility of an allergic reaction. These can range from a mild skin rash (contact dermatitis) to a severe systemic reaction (anaphylactic reaction), which can result in death. 6. Death: In general, any medical intervention can result in death, most of the time due to an unforeseen complication. ____________________________________________________________________________________________   

## 2020-04-12 ENCOUNTER — Encounter: Payer: Medicaid Other | Admitting: Pain Medicine

## 2020-04-13 ENCOUNTER — Encounter: Payer: Self-pay | Admitting: Pain Medicine

## 2020-04-13 ENCOUNTER — Ambulatory Visit
Admission: RE | Admit: 2020-04-13 | Discharge: 2020-04-13 | Disposition: A | Discharge status: Home / Self Care | Payer: Medicaid Other | Source: Ambulatory Visit | Attending: Pain Medicine | Admitting: Pain Medicine

## 2020-04-13 ENCOUNTER — Ambulatory Visit: Payer: Medicaid Other | Admitting: Pain Medicine

## 2020-04-13 ENCOUNTER — Other Ambulatory Visit: Payer: Self-pay

## 2020-04-13 VITALS — BP 115/74 | HR 82 | Temp 96.8°F | Resp 15 | Ht 63.0 in | Wt 151.0 lb

## 2020-04-13 DIAGNOSIS — M5481 Occipital neuralgia: Secondary | ICD-10-CM | POA: Insufficient documentation

## 2020-04-13 DIAGNOSIS — M5030 Other cervical disc degeneration, unspecified cervical region: Secondary | ICD-10-CM

## 2020-04-13 DIAGNOSIS — M25511 Pain in right shoulder: Secondary | ICD-10-CM | POA: Diagnosis present

## 2020-04-13 DIAGNOSIS — G8918 Other acute postprocedural pain: Secondary | ICD-10-CM | POA: Insufficient documentation

## 2020-04-13 DIAGNOSIS — M47812 Spondylosis without myelopathy or radiculopathy, cervical region: Secondary | ICD-10-CM

## 2020-04-13 DIAGNOSIS — G8929 Other chronic pain: Secondary | ICD-10-CM | POA: Diagnosis present

## 2020-04-13 DIAGNOSIS — M549 Dorsalgia, unspecified: Secondary | ICD-10-CM | POA: Diagnosis present

## 2020-04-13 DIAGNOSIS — M25512 Pain in left shoulder: Secondary | ICD-10-CM | POA: Insufficient documentation

## 2020-04-13 DIAGNOSIS — M542 Cervicalgia: Secondary | ICD-10-CM | POA: Diagnosis not present

## 2020-04-13 DIAGNOSIS — R937 Abnormal findings on diagnostic imaging of other parts of musculoskeletal system: Secondary | ICD-10-CM | POA: Insufficient documentation

## 2020-04-13 DIAGNOSIS — M4802 Spinal stenosis, cervical region: Secondary | ICD-10-CM | POA: Insufficient documentation

## 2020-04-13 MED ORDER — FENTANYL CITRATE (PF) 100 MCG/2ML IJ SOLN
INTRAMUSCULAR | Status: AC
  Filled 2020-04-13: qty 2

## 2020-04-13 MED ORDER — ROPIVACAINE HCL 2 MG/ML IJ SOLN
INTRAMUSCULAR | Status: AC
  Filled 2020-04-13: qty 10

## 2020-04-13 MED ORDER — MIDAZOLAM HCL 5 MG/5ML IJ SOLN
1.0000 mg | INTRAMUSCULAR | Status: DC | PRN
  Administered 2020-04-13 (×2): 0.5 mg via INTRAVENOUS
  Administered 2020-04-13: 1.5 mg via INTRAVENOUS

## 2020-04-13 MED ORDER — LIDOCAINE HCL 2 % IJ SOLN
INTRAMUSCULAR | Status: AC
  Filled 2020-04-13: qty 20

## 2020-04-13 MED ORDER — LIDOCAINE HCL 2 % IJ SOLN
20.0000 mL | Freq: Once | INTRAMUSCULAR | Status: AC
  Administered 2020-04-13: 400 mg

## 2020-04-13 MED ORDER — DEXAMETHASONE SODIUM PHOSPHATE 10 MG/ML IJ SOLN
INTRAMUSCULAR | Status: AC
  Filled 2020-04-13: qty 1

## 2020-04-13 MED ORDER — LACTATED RINGERS IV SOLN
1000.0000 mL | Freq: Once | INTRAVENOUS | Status: AC
  Administered 2020-04-13: 1000 mL via INTRAVENOUS

## 2020-04-13 MED ORDER — FENTANYL CITRATE (PF) 100 MCG/2ML IJ SOLN
25.0000 ug | INTRAMUSCULAR | Status: AC | PRN
  Administered 2020-04-13 (×2): 25 ug via INTRAVENOUS

## 2020-04-13 MED ORDER — MIDAZOLAM HCL 5 MG/5ML IJ SOLN
INTRAMUSCULAR | Status: AC
  Filled 2020-04-13: qty 5

## 2020-04-13 MED ORDER — HYDROCODONE-ACETAMINOPHEN 5-325 MG PO TABS: 1.0000 | ORAL_TABLET | Freq: Three times a day (TID) | ORAL | 0 refills | Status: AC | PRN

## 2020-04-13 MED ORDER — ROPIVACAINE HCL 2 MG/ML IJ SOLN
9.0000 mL | Freq: Once | INTRAMUSCULAR | Status: AC
  Administered 2020-04-13: 9 mL via PERINEURAL

## 2020-04-13 MED ORDER — DEXAMETHASONE SODIUM PHOSPHATE 10 MG/ML IJ SOLN
10.0000 mg | Freq: Once | INTRAMUSCULAR | Status: AC
  Administered 2020-04-13: 10 mg

## 2020-04-13 NOTE — Patient Instructions (Addendum)
___________________________________________________________________________________________  Post-Radiofrequency (RF) Discharge Instructions  You have just completed a Radiofrequency Neurotomy.  The following instructions will provide you with information and guidelines for self-care upon discharge.  If at any time you have questions or concerns please call your physician. DO NOT DRIVE YOURSELF!!  Instructions:  Apply ice: Fill a plastic sandwich bag with crushed ice. Cover it with a small towel and apply to injection site. Apply for 15 minutes then remove x 15 minutes. Repeat sequence on day of procedure, until you go to bed. The purpose is to minimize swelling and discomfort after procedure.  Apply heat: Apply heat to procedure site starting the day following the procedure. The purpose is to treat any soreness and discomfort from the procedure.  Food intake: No eating limitations, unless stipulated above.  Nevertheless, if you have had sedation, you may experience some nausea.  In this case, it may be wise to wait at least two hours prior to resuming regular diet.  Physical activities: Keep activities to a minimum for the first 8 hours after the procedure. For the first 24 hours after the procedure, do not drive a motor vehicle,  Operate heavy machinery, power tools, or handle any weapons.  Consider walking with the use of an assistive device or accompanied by an adult for the first 24 hours.  Do not drink alcoholic beverages including beer.  Do not make any important decisions or sign any legal documents. Go home and rest today.  Resume activities tomorrow, as tolerated.  Use caution in moving about as you may experience mild leg weakness.  Use caution in cooking, use of household electrical appliances and climbing steps.  Driving: If you have received any sedation, you are not allowed to drive for 24 hours after your procedure.  Blood thinner: Restart your blood thinner 6 hours after your  procedure. (Only for those taking blood thinners)  Insulin: As soon as you can eat, you may resume your normal dosing schedule. (Only for those taking insulin)  Medications: May resume pre-procedure medications.  Do not take any drugs, other than what has been prescribed to you.  Infection prevention: Keep procedure site clean and dry.  Post-procedure Pain Diary: Extremely important that this be done correctly and accurately. Recorded information will be used to determine the next step in treatment.  Pain evaluated is that of treated area only. Do not include pain from an untreated area.  Complete every hour, on the hour, for the initial 8 hours. Set an alarm to help you do this part accurately.  Do not go to sleep and have it completed later. It will not be accurate.  Follow-up appointment: Keep your follow-up appointment after the procedure. Usually 2-6 weeks after radiofrequency. Bring you pain diary. The information collected will be essential for your long-term care.   Expect:  From numbing medicine (AKA: Local Anesthetics): Numbness or decrease in pain.  Onset: Full effect within 15 minutes of injected.  Duration: It will depend on the type of local anesthetic used. On the average, 1 to 8 hours.   From steroids (when added): Decrease in swelling or inflammation. Once inflammation is improved, relief of the pain will follow.  Onset of benefits: Depends on the amount of swelling present. The more swelling, the longer it will take for the benefits to be seen. In some cases, up to 10 days.  Duration: Steroids will stay in the system x 2 weeks. Duration of benefits will depend on multiple posibilities including persistent irritating factors.    From procedure: Some discomfort is to be expected once the numbing medicine wears off. In the case of radiofrequency procedures, this may last as long as 6 weeks. Additional post-procedure pain medication is provided for this. Discomfort is  minimized if ice and heat are applied as instructed.  Call if:  You experience numbness and weakness that gets worse with time, as opposed to wearing off.  He experience any unusual bleeding, difficulty breathing, or loss of the ability to control your bowel and bladder. (This applies to Spinal procedures only)  You experience any redness, swelling, heat, red streaks, elevated temperature, fever, or any other signs of a possible infection.  Emergency Numbers:  [place] hours (Monday - Thursday, 8:00 AM - 4:00 PM) (Friday, 9:00 AM - 12:00 Noon): [phone]  After hours: [phone] ____________________________________________________________________________________________   ___________________________________________________________________________________________  Post-Radiofrequency (RF) Discharge Instructions  You have just completed a Radiofrequency Neurotomy.  The following instructions will provide you with information and guidelines for self-care upon discharge.  If at any time you have questions or concerns please call your physician. DO NOT DRIVE YOURSELF!!  Instructions:  Apply ice: Fill a plastic sandwich bag with crushed ice. Cover it with a small towel and apply to injection site. Apply for 15 minutes then remove x 15 minutes. Repeat sequence on day of procedure, until you go to bed. The purpose is to minimize swelling and discomfort after procedure.  Apply heat: Apply heat to procedure site starting the day following the procedure. The purpose is to treat any soreness and discomfort from the procedure.  Food intake: No eating limitations, unless stipulated above.  Nevertheless, if you have had sedation, you may experience some nausea.  In this case, it may be wise to wait at least two hours prior to resuming regular diet.  Physical activities: Keep activities to a minimum for the first 8 hours after the procedure. For the first 24 hours after the  procedure, do not drive a motor vehicle,  Operate heavy machinery, power tools, or handle any weapons.  Consider walking with the use of an assistive device or accompanied by an adult for the first 24 hours.  Do not drink alcoholic beverages including beer.  Do not make any important decisions or sign any legal documents. Go home and rest today.  Resume activities tomorrow, as tolerated.  Use caution in moving about as you may experience mild leg weakness.  Use caution in cooking, use of household electrical appliances and climbing steps.  Driving: If you have received any sedation, you are not allowed to drive for 24 hours after your procedure.  Blood thinner: Restart your blood thinner 6 hours after your procedure. (Only for those taking blood thinners)  Insulin: As soon as you can eat, you may resume your normal dosing schedule. (Only for those taking insulin)  Medications: May resume pre-procedure medications.  Do not take any drugs, other than what has been prescribed to you.  Infection prevention: Keep procedure site clean and dry.  Post-procedure Pain Diary: Extremely important that this be done correctly and accurately. Recorded information will be used to determine the next step in treatment.  Pain evaluated is that of treated area only. Do not include pain from an untreated area.  Complete every hour, on the hour, for the initial 8 hours. Set an alarm to help you do this part accurately.  Do not go to sleep and have it completed later. It will not be accurate.  Follow-up appointment: Keep your  follow-up appointment after the procedure. Usually 2-6 weeks after radiofrequency. Bring you pain diary. The information collected will be essential for your long-term care.   Expect:  From numbing medicine (AKA: Local Anesthetics): Numbness or decrease in pain.  Onset: Full effect within 15 minutes of injected.  Duration: It will depend on the type of local anesthetic used. On the  average, 1 to 8 hours.   From steroids (when added): Decrease in swelling or inflammation. Once inflammation is improved, relief of the pain will follow.  Onset of benefits: Depends on the amount of swelling present. The more swelling, the longer it will take for the benefits to be seen. In some cases, up to 10 days.  Duration: Steroids will stay in the system x 2 weeks. Duration of benefits will depend on multiple posibilities including persistent irritating factors.  From procedure: Some discomfort is to be expected once the numbing medicine wears off. In the case of radiofrequency procedures, this may last as long as 6 weeks. Additional post-procedure pain medication is provided for this. Discomfort is minimized if ice and heat are applied as instructed.  Call if:  You experience numbness and weakness that gets worse with time, as opposed to wearing off.  He experience any unusual bleeding, difficulty breathing, or loss of the ability to control your bowel and bladder. (This applies to Spinal procedures only)  You experience any redness, swelling, heat, red streaks, elevated temperature, fever, or any other signs of a possible infection.  Emergency Numbers:  [place] business hours (Monday - Thursday, 8:00 AM - 4:00 PM) (Friday, 9:00 AM - 12:00 Noon): [phone]  After hours: [phone] ____________________________________________________________________________________________    Facet Joint Block The facet joints connect the bones of the spine (vertebrae). They make it possible for you to bend, twist, and make other movements with your spine. They also keep you from bending too far, twisting too far, and making other extreme movements. A facet joint block is a procedure in which a numbing medicine (anesthetic) is injected into a facet joint. In many cases, an anti-inflammatory medicine (steroid) is also injected. A facet joint block may be done:  To diagnose neck or back  pain. If the pain gets better after a facet joint block, it means the pain is probably coming from the facet joint. If the pain does not get better, it means the pain is probably not coming from the facet joint.  To relieve neck or back pain that is caused by an inflamed facet joint. A facet joint block is only done to relieve pain if the pain does not improve with other methods, such as medicine, exercise programs, and physical therapy. Tell a health care provider about:  Any allergies you have.  All medicines you are taking, including vitamins, herbs, eye drops, creams, and over-the-counter medicines.  Any problems you or family members have had with anesthetic medicines.  Any blood disorders you have.  Any surgeries you have had.  Any medical conditions you have or have had.  Whether you are pregnant or may be pregnant. What are the risks? Generally, this is a safe procedure. However, problems may occur, including:  Bleeding.  Injury to a nerve near the injection site.  Pain at the injection site.  Weakness or numbness in areas controlled by nerves near the injection site.  Infection.  Temporary fluid retention.  Allergic reactions to medicines or dyes.  Injury to other structures or organs near the injection site. What happens before the  procedure? Medicines Ask your health care provider about:  Changing or stopping your regular medicines. This is especially important if you are taking diabetes medicines or blood thinners.  Taking medicines such as aspirin and ibuprofen. These medicines can thin your blood. Do not take these medicines unless your health care provider tells you to take them.  Taking over-the-counter medicines, vitamins, herbs, and supplements. Eating and drinking Follow instructions from your health care provider about eating and drinking, which may include:  8 hours before the procedure - stop eating heavy meals or foods, such as meat, fried  foods, or fatty foods.  6 hours before the procedure - stop eating light meals or foods, such as toast or cereal.  6 hours before the procedure - stop drinking milk or drinks that contain milk.  2 hours before the procedure - stop drinking clear liquids. Staying hydrated Follow instructions from your health care provider about hydration, which may include:  Up to 2 hours before the procedure - you may continue to drink clear liquids, such as water, clear fruit juice, black coffee, and plain tea. General instructions  Do not use any products that contain nicotine or tobacco for at least 4-6 weeks before the procedure. These products include cigarettes, e-cigarettes, and chewing tobacco. If you need help quitting, ask your health care provider.  Plan to have someone take you home from the hospital or clinic.  Ask your health care provider: ? How your surgery site will be marked. ? What steps will be taken to help prevent infection. These may include:  Removing hair at the surgery site.  Washing skin with a germ-killing soap.  Receiving antibiotic medicine. What happens during the procedure?  You will put on a hospital gown.  You will lie on your stomach on an X-ray table. You may be asked to lie in a different position if an injection will be made in your neck.  Machines will be used to monitor your oxygen levels, heart rate, and blood pressure.  Your skin will be cleaned.  If an injection will be made in your neck, an IV will be inserted into one of your veins. Fluids and medicine will flow directly into your body through the IV.  A numbing medicine (local anesthetic) will be applied to your skin. Your skin may sting or burn for a moment.  A video X-ray machine (fluoroscopy) will be used to find the joint. In some cases, a CT scan may be used.  A contrast dye may be injected into the facet joint area to help find the joint.  When the joint is located, an anesthetic will be  injected into the joint through the needle.  Your health care provider will ask you whether you feel pain relief. ? If you feel relief, a steroid may be injected to provide pain relief for a longer period of time. ? If you do not feel relief or feel only partial relief, additional injections of an anesthetic may be made in other facet joints.  The needle will be removed.  Your skin will be cleaned.  A bandage (dressing) will be applied over each injection site. The procedure may vary among health care providers and hospitals.   What happens after the procedure?  Your blood pressure, heart rate, breathing rate, and blood oxygen level will be monitored until you leave the hospital or clinic.  You will lie down and rest for a period of time. Summary  A facet joint block is a procedure  in which a numbing medicine (anesthetic) is injected into a facet joint. An anti-inflammatory medicine (stereoid) may also be injected.  Follow instructions from your health care provider about medicines and eating and drinking before the procedure.  Do not use any products that contain nicotine or tobacco for at least 4-6 weeks before the procedure.  You will lie on your stomach for the procedure, but you may be asked to lie in a different position if an injection will be made in your neck.  When the joint is located, an anesthetic will be injected into the joint through the needle. This information is not intended to replace advice given to you by your health care provider. Make sure you discuss any questions you have with your health care provider. Document Revised: 04/30/2018 Document Reviewed: 12/12/2017 [employer] Patient Education  2021 [employer].

## 2020-04-13 NOTE — Progress Notes (Signed)
Safety precautions to be maintained throughout the outpatient stay will include: orient to surroundings, keep bed in low position, maintain call bell within reach at all times, provide assistance with transfer out of bed and ambulation.  

## 2020-04-13 NOTE — Progress Notes (Signed)
PROVIDER NOTE: Information contained herein reflects review and annotations entered in association with encounter. Interpretation of such information and data should be left to medically-trained personnel. Information provided to patient can be located elsewhere in the medical record under "Patient Instructions". Document created using STT-dictation technology, any transcriptional errors that may result from process are unintentional.    Patient: Tanya [name]  Service Category: Procedure  Provider: Gaspar Cola, MD  DOB: Jun 27, 1960  DOS: 04/13/2020  Location: [facility]  MRN: [id number]  Setting: Ambulatory - outpatient  Referring Provider: Milinda Pointer, MD  Type: Established Patient  Specialty: Interventional Pain Management  PCP: Henrietta Hoover, MD   Primary Reason for Visit: Interventional Pain Management Treatment. CC: Neck Pain  Procedure:          Anesthesia, Analgesia, Anxiolysis:  Type: Cervical Facet, Medial Branch Radiofrequency Ablation  #3  Primary Purpose: Therapeutic Region: Posterolateral cervical spine region Level: C3, C4, C5, C6, & C7 Medial Branch Level(s). Lesioning of these levels should completely denervate the C3-4, C4-5, C5-6, and the C6-7 cervical facet joints. Laterality: Right Paraspinal  Type: Moderate (Conscious) Sedation combined with Local Anesthesia Indication(s): Analgesia and Anxiety Route: Intravenous (IV) IV Access: Secured Sedation: Meaningful verbal contact was maintained at all times during the procedure  Local Anesthetic: Lidocaine 1-2%  Position: Prone with head of the table was raised to facilitate breathing.   Indications: 1. Cervical facet syndrome (Bilateral) (R>L)   2. Cervical spondylosis   3. Cervicalgia   4. DDD (degenerative disc disease), cervical   5. Spondylosis without myelopathy or radiculopathy, cervical region   6. Arthropathy of cervical facet joint (Bilateral)   7. Cervical spine  pain    Tanya [name] has been dealing with the above chronic pain for longer than three months and has either failed to respond, was unable to tolerate, or simply did not get enough benefit from other more conservative therapies including, but not limited to: 1. Over-the-counter medications 2. Anti-inflammatory medications 3. Muscle relaxants 4. Membrane stabilizers 5. Opioids 6. Physical therapy and/or chiropractic manipulation 7. Modalities (Heat, ice, etc.) 8. Invasive techniques such as nerve blocks. Tanya [name] has attained more than 50% relief of the pain from a series of diagnostic injections conducted in separate occasions.  Pain Score: Pre-procedure: 4 /10 Post-procedure: 0-No pain/10  Post-Procedure Evaluation  Procedure (04/03/2020): Therapeutic left cervical facet RFA #3 under fluoroscopic guidance and IV sedation Pre-procedure pain level: 7/10 Post-procedure: 0/10 (100% relief)  Sedation: Sedation provided.  Effectiveness during initial hour after procedure(Ultra-Short Term Relief): 100 %.  Local anesthetic used: Long-acting (4-6 hours) Effectiveness: Defined as any analgesic benefit obtained secondary to the administration of local anesthetics. This carries significant diagnostic value as to the etiological location, or anatomical origin, of the pain. Duration of benefit is expected to coincide with the duration of the local anesthetic used.  Effectiveness during initial 4-6 hours after procedure(Short-Term Relief): 100 %.  Long-term benefit: Defined as any relief past the pharmacologic duration of the local anesthetics.  Effectiveness past the initial 6 hours after procedure(Long-Term Relief): 75 %.  Current benefits: Defined as benefit that persist at this time.   Analgesia:  The patient indicates currently enjoying an ongoing 75% relief of the pain from the left-sided radiofrequency ablation. Function: Tanya [name] reports improvement in function ROM: Ms.  [name] reports improvement in ROM  Pre-op H&P Assessment:  Tanya [name] is a 60 y.o. (year old), female patient, seen today for interventional treatment. She  has  a past surgical history that includes Abdominal hysterectomy; Colonoscopy; Colonoscopy with propofol (N/A, 07/03/2017); Tonsillectomy; Esophagogastroduodenoscopy (N/A, 10/21/2017); and Colonoscopy with propofol (N/A, 10/21/2017). Tanya [name] has a current medication list which includes the following prescription(s): atorvastatin, buspirone, duloxetine, gabapentin, hydrocodone-acetaminophen, [START ON 04/20/2020] hydrocodone-acetaminophen, hydroxychloroquine, oxycodone, [START ON 05/05/2020] oxycodone, [START ON 06/04/2020] oxycodone, baclofen, and nicotine step 1, and the following Facility-Administered Medications: midazolam. Her primarily concern today is the Neck Pain  Initial Vital Signs:  Pulse/HCG Rate: 92ECG Heart Rate: 76 Temp: (!) 96.8 F (36 C) Resp: 18 BP: 130/82 SpO2: 98 %  BMI: Estimated body mass index is 26.75 kg/m as calculated from the following:   Height as of this encounter: 5\' 3"  (1.6 m).   Weight as of this encounter: 151 lb (68.5 kg).  Risk Assessment: Allergies: Reviewed. She is allergic to pregabalin, trazodone, bupropion, methocarbamol, and tramadol.  Allergy Precautions: None required Coagulopathies: Reviewed. None identified.  Blood-thinner therapy: None at this time Active Infection(s): Reviewed. None identified. Tanya [name] is afebrile  Site Confirmation: Tanya [name] was asked to confirm the procedure and laterality before marking the site Procedure checklist: Completed Consent: Before the procedure and under the influence of no sedative(s), amnesic(s), or anxiolytics, the patient was informed of the treatment options, risks and possible complications. To fulfill our ethical and legal obligations, as recommended by the American Medical Association's Code of Ethics, I have informed the patient  of my clinical impression; the nature and purpose of the treatment or procedure; the risks, benefits, and possible complications of the intervention; the alternatives, including doing nothing; the risk(s) and benefit(s) of the alternative treatment(s) or procedure(s); and the risk(s) and benefit(s) of doing nothing. The patient was provided information about the general risks and possible complications associated with the procedure. These may include, but are not limited to: failure to achieve desired goals, infection, bleeding, organ or nerve damage, allergic reactions, paralysis, and death. In addition, the patient was informed of those risks and complications associated to Spine-related procedures, such as failure to decrease pain; infection (i.e.: Meningitis, epidural or intraspinal abscess); bleeding (i.e.: epidural hematoma, subarachnoid hemorrhage, or any other type of intraspinal or peri-dural bleeding); organ or nerve damage (i.e.: Any type of peripheral nerve, nerve root, or spinal cord injury) with subsequent damage to sensory, motor, and/or autonomic systems, resulting in permanent pain, numbness, and/or weakness of one or several areas of the body; allergic reactions; (i.e.: anaphylactic reaction); and/or death. Furthermore, the patient was informed of those risks and complications associated with the medications. These include, but are not limited to: allergic reactions (i.e.: anaphylactic or anaphylactoid reaction(s)); adrenal axis suppression; blood sugar elevation that in diabetics may result in ketoacidosis or comma; water retention that in patients with history of congestive heart failure may result in shortness of breath, pulmonary edema, and decompensation with resultant heart failure; weight gain; swelling or edema; medication-induced neural toxicity; particulate matter embolism and blood vessel occlusion with resultant organ, and/or nervous system infarction; and/or aseptic necrosis of one  or more joints. Finally, the patient was informed that Medicine is not an exact science; therefore, there is also the possibility of unforeseen or unpredictable risks and/or possible complications that may result in a catastrophic outcome. The patient indicated having understood very clearly. We have given the patient no guarantees and we have made no promises. Enough time was given to the patient to ask questions, all of which were answered to the patient's satisfaction. Ms. Samudio has indicated that she wanted to continue with the  procedure. Attestation: I, the ordering provider, attest that I have discussed with the patient the benefits, risks, side-effects, alternatives, likelihood of achieving goals, and potential problems during recovery for the procedure that I have provided informed consent. Date  Time: 04/13/2020 12:46 PM  Pre-Procedure Preparation:  Monitoring: As per clinic protocol. Respiration, ETCO2, SpO2, BP, heart rate and rhythm monitor placed and checked for adequate function Safety Precautions: Patient was assessed for positional comfort and pressure points before starting the procedure. Time-out: I initiated and conducted the "Time-out" before starting the procedure, as per protocol. The patient was asked to participate by confirming the accuracy of the "Time Out" information. Verification of the correct person, site, and procedure were performed and confirmed by me, the nursing staff, and the patient. "Time-out" conducted as per Joint Commission's Universal Protocol (UP.01.01.01). Time: 1308  Description of Procedure:          Laterality: Right Level: C3, C4, C5, C6, & C7 Medial Branch Level(s). Area Prepped: Entire Posterior Cervico-thoracic Region DuraPrep (Iodine Povacrylex [0.7% available iodine] and Isopropyl Alcohol, 74% w/w) Safety Precautions: Aspiration looking for blood return was conducted prior to all injections. At no point did we inject any substances, as a  needle was being advanced. Before injecting, the patient was told to immediately notify me if she was experiencing any new onset of "ringing in the ears, or metallic taste in the mouth". No attempts were made at seeking any paresthesias. Safe injection practices and needle disposal techniques used. Medications properly checked for expiration dates. SDV (single dose vial) medications used. After the completion of the procedure, all disposable equipment used was discarded in the proper designated medical waste containers. Local Anesthesia: Protocol guidelines were followed. The patient was positioned over the fluoroscopy table. The area was prepped in the usual manner. The time-out was completed. The target area was identified using fluoroscopy. A 12-in long, straight, sterile hemostat was used with fluoroscopic guidance to locate the targets for each level blocked. Once located, the skin was marked with an approved surgical skin marker. Once all sites were marked, the skin (epidermis, dermis, and hypodermis), as well as deeper tissues (fat, connective tissue and muscle) were infiltrated with a small amount of a short-acting local anesthetic, loaded on a 10cc syringe with a 25G, 1.5-in  Needle. An appropriate amount of time was allowed for local anesthetics to take effect before proceeding to the next step. Local Anesthetic: Lidocaine 2.0% The unused portion of the local anesthetic was discarded in the proper designated containers. Technical explanation of process:  Radiofrequency Ablation (RFA) C3 Medial Branch Nerve RFA: The target area for the C3 dorsal medial articular branch is the lateral concave waist of the articular pillar of C3. Under fluoroscopic guidance, a Radiofrequency needle was inserted until contact was made with os over the postero-lateral aspect of the articular pillar of C3 (target area). Sensory and motor testing was conducted to properly adjust the position of the needle. Once satisfactory  placement of the needle was achieved, the numbing solution was slowly injected after negative aspiration for blood. 2.0 mL of the nerve block solution was injected without difficulty or complication. After waiting for at least 3 minutes, the ablation was performed. Once completed, the needle was removed intact. C4 Medial Branch Nerve RFA: The target area for the C4 dorsal medial articular branch is the lateral concave waist of the articular pillar of C4. Under fluoroscopic guidance, a Radiofrequency needle was inserted until contact was made with os over the postero-lateral aspect  of the articular pillar of C4 (target area). Sensory and motor testing was conducted to properly adjust the position of the needle. Once satisfactory placement of the needle was achieved, the numbing solution was slowly injected after negative aspiration for blood. 2.0 mL of the nerve block solution was injected without difficulty or complication. After waiting for at least 3 minutes, the ablation was performed. Once completed, the needle was removed intact. C5 Medial Branch Nerve RFA: The target area for the C5 dorsal medial articular branch is the lateral concave waist of the articular pillar of C5. Under fluoroscopic guidance, a Radiofrequency needle was inserted until contact was made with os over the postero-lateral aspect of the articular pillar of C5 (target area). Sensory and motor testing was conducted to properly adjust the position of the needle. Once satisfactory placement of the needle was achieved, the numbing solution was slowly injected after negative aspiration for blood. 2.0 mL of the nerve block solution was injected without difficulty or complication. After waiting for at least 3 minutes, the ablation was performed. Once completed, the needle was removed intact. C6 Medial Branch Nerve RFA: The target area for the C6 dorsal medial articular branch is the lateral concave waist of the articular pillar of C6. Under  fluoroscopic guidance, a Radiofrequency needle was inserted until contact was made with os over the postero-lateral aspect of the articular pillar of C6 (target area). Sensory and motor testing was conducted to properly adjust the position of the needle. Once satisfactory placement of the needle was achieved, the numbing solution was slowly injected after negative aspiration for blood. 2.0 mL of the nerve block solution was injected without difficulty or complication. After waiting for at least 3 minutes, the ablation was performed. Once completed, the needle was removed intact. C7 Medial Branch Nerve RFA: The target for the C7 dorsal medial articular branch lies on the superior-medial tip of the C7 transverse process. Under fluoroscopic guidance, a Radiofrequency needle was inserted until contact was made with os over the postero-lateral aspect of the articular pillar of C7 (target area). Sensory and motor testing was conducted to properly adjust the position of the needle. Once satisfactory placement of the needle was achieved, the numbing solution was slowly injected after negative aspiration for blood. 2.0 mL of the nerve block solution was injected without difficulty or complication. After waiting for at least 3 minutes, the ablation was performed. Once completed, the needle was removed intact. Radiofrequency lesioning (ablation):  Radiofrequency Generator: NeuroTherm [id number] Sensory Stimulation Parameters: 50 Hz was used to locate & identify the nerve, making sure that the needle was positioned such that there was no sensory stimulation below 0.3 V or above 0.7 V. Motor Stimulation Parameters: 2 Hz was used to evaluate the motor component. Care was taken not to lesion any nerves that demonstrated motor stimulation of the lower extremities at an output of less than 2.5 times that of the sensory threshold, or a maximum of 2.0 V. Lesioning Technique Parameters: Standard Radiofrequency settings. (Not bipolar  or pulsed.) Temperature Settings: 80 degrees C Lesioning time: 60 seconds Intra-operative Compliance: Compliant Materials & Medications: Needle(s) (Electrode/Cannula) Type: Teflon-coated, curved tip, Radiofrequency needle(s) Gauge: 22G Length: 10cm Numbing solution: 0.2% PF-Ropivacaine + Triamcinolone (40 mg/mL) diluted to a final concentration of 4 mg of Triamcinolone/mL of Ropivacaine The unused portion of the solution was discarded in the proper designated containers.  Once the entire procedure was completed, the treated area was cleaned, making sure to leave some of the prepping solution  back to take advantage of its long term bactericidal properties. Intra-operative Compliance: Compliant  Anatomy Reference Guide:          Vitals:   04/13/20 1335 04/13/20 1345 04/13/20 1355 04/13/20 1405  BP: 129/78 129/76 124/72 115/74  Pulse: 82     Resp: 15 16 16 15   Temp:      SpO2: 95% 100% 97% 99%  Weight:      Height:        Start Time: 1308 hrs. End Time: 1335 hrs.  Imaging Guidance (Spinal):          Type of Imaging Technique: Fluoroscopy Guidance (Spinal) Indication(s): Assistance in needle guidance and placement for procedures requiring needle placement in or near specific anatomical locations not easily accessible without such assistance. Exposure Time: Please see nurses notes. Contrast: None used. Fluoroscopic Guidance: I was personally present during the use of fluoroscopy. "Tunnel Vision Technique" used to obtain the best possible view of the target area. Parallax error corrected before commencing the procedure. "Direction-depth-direction" technique used to introduce the needle under continuous pulsed fluoroscopy. Once target was reached, antero-posterior, oblique, and lateral fluoroscopic projection used confirm needle placement in all planes. Images permanently stored in EMR. Interpretation: No contrast injected. I personally interpreted the imaging intraoperatively. Adequate  needle placement confirmed in multiple planes. Permanent images saved into the patient's record.  Antibiotic Prophylaxis:   Anti-infectives (From admission, onward)   None     Indication(s): None identified  Post-operative Assessment:  Post-procedure Vital Signs:  Pulse/HCG Rate: 82 (nsr)77 Temp: (!) 96.8 F (36 C) Resp: 15 BP: 115/74 SpO2: 99 %  EBL: None  Complications: No immediate post-treatment complications observed by team, or reported by patient.  Note: The patient tolerated the entire procedure well. A repeat set of vitals were taken after the procedure and the patient was kept under observation following institutional policy, for this type of procedure. Post-procedural neurological assessment was performed, showing return to baseline, prior to discharge. The patient was provided with post-procedure discharge instructions, including a section on how to identify potential problems. Should any problems arise concerning this procedure, the patient was given instructions to immediately contact us, at any time, without hesitation. In any case, we plan to contact the patient by telephone for a follow-up status report regarding this interventional procedure.  Comments:  No additional relevant information.  Plan of Care  Orders:  Orders Placed This Encounter  Procedures  . Radiofrequency,Cervical    Scheduling Instructions:     Side(s): Right-sided     Level(s): C3, C4, C5, C6, & C7 Medial Branch Nerve(s)     Sedation: Patient's choice.     Timeframe: Today    Order Specific Question:   Where will this procedure be performed?    Answer:   [facility]  . [facility] C-ARM 1-60 MIN NO REPORT    Intraoperative interpretation by procedural physician at [place].    Standing Status:   Standing    Number of Occurrences:   1    Order Specific Question:   Reason for exam:    Answer:   Assistance in needle guidance and placement for procedures requiring  needle placement in or near specific anatomical locations not easily accessible without such assistance.  . Informed Consent Details: Physician/Practitioner Attestation; Transcribe to consent form and obtain patient signature    Nursing Order: Transcribe to consent form and obtain patient signature. Note: Always confirm laterality of pain with Ms. Santoyo, before procedure.  Order Specific Question:   Physician/Practitioner attestation of informed consent for procedure/surgical case    Answer:   I, the physician/practitioner, attest that I have discussed with the patient the benefits, risks, side effects, alternatives, likelihood of achieving goals and potential problems during recovery for the procedure that I have provided informed consent.    Order Specific Question:   Procedure    Answer:   Cervical Facet Radiofrequency Ablation    Order Specific Question:   Physician/Practitioner performing the procedure    Answer:   Francisco A. Dossie Arbour, MD    Order Specific Question:   Indication/Reason    Answer:   Neck Pain (Cervicalgia), with our without referred arm pain, due to Facet Joint Arthralgia (Joint Pain) known as Cervical Facet Syndrome, secondary to Cervical, and/or Cervico-thoracic Spondylosis (Arthritis of the Spine), without myelopathy or radiculop  . Provide equipment / supplies at bedside    "Radiofrequency Tray"; Large hemostat (x1); Small hemostat (x1); Towels (x8); 4x4 sterile sponge pack (x1) Needle type: Teflon-coated Radiofrequency Needle (Disposable  single use) Size: Short Quantity: 5    Standing Status:   Standing    Number of Occurrences:   1    Order Specific Question:   Specify    Answer:   Radiofrequency Tray   Chronic Opioid Analgesic:  Oxycodone IR 5 mg, 1 tab PO BID (10 mg/day of oxycodone) MME/day: 15 mg/day.   Medications ordered for procedure: Meds ordered this encounter  Medications  . HYDROcodone-acetaminophen (NORCO/VICODIN) 5-325 MG tablet    Sig:  Take 1 tablet by mouth every 8 (eight) hours as needed for up to 7 days for severe pain. Must last 7 days.    Dispense:  21 tablet    Refill:  0    For acute post-operative pain. Not to be refilled. Must last 7 days.  Marland Kitchen HYDROcodone-acetaminophen (NORCO/VICODIN) 5-325 MG tablet    Sig: Take 1 tablet by mouth every 8 (eight) hours as needed for up to 7 days for severe pain. Must last for 7 days.    Dispense:  21 tablet    Refill:  0    For acute post-operative pain. Not to be refilled. Must last 7 days.  Marland Kitchen lidocaine (XYLOCAINE) 2 % (with pres) injection 400 mg  . lactated ringers infusion 1,000 mL  . midazolam (VERSED) 5 MG/5ML injection 1-2 mg    Make sure Flumazenil is available in the pyxis when using this medication. If oversedation occurs, administer 0.2 mg IV over 15 sec. If after 45 sec no response, administer 0.2 mg again over 1 min; may repeat at 1 min intervals; not to exceed 4 doses (1 mg)  . fentaNYL (SUBLIMAZE) injection 25-50 mcg    Make sure Narcan is available in the pyxis when using this medication. In the event of respiratory depression (RR< 8/min): Titrate NARCAN (naloxone) in increments of 0.1 to 0.2 mg IV at 2-3 minute intervals, until desired degree of reversal.  . ropivacaine (PF) 2 mg/mL (0.2%) (NAROPIN) injection 9 mL  . dexamethasone (DECADRON) injection 10 mg   Medications administered: We administered lidocaine, lactated ringers, midazolam, fentaNYL, ropivacaine (PF) 2 mg/mL (0.2%), and dexamethasone.  See the medical record for exact dosing, route, and time of administration.  Follow-up plan:   Return in about 6 weeks (around 05/25/2020) for on afternoon of procedure day, (VV), (PPE).       Interventional Therapies  Risk  Complexity Considerations:   WNL   Planned  Pending:   Therapeutic/palliative left cervical  facet RFA #3 (last done 10/23/2017)    Under consideration:   Diagnostic right suprascapular NB Diagnostic bilateral lumbar facet block    Completed:   Palliative right glenohumeral joint injection x2  Palliative right AC joint injection x3  Palliative left medial epicondyle elbow injection x1  Diagnostic left GONB x1  Palliative right CESI(Series x1) Palliative bilateral cervical facet block  Palliative right cervical facet RFAx3(04/13/2020) Palliative left cervical facet RFA x2 (10/23/2017)   Therapeutic  Palliative (PRN) options:   Palliative right glenohumeral joint injection #3  Palliative right AC joint injection #4  Palliative left medial epicondyle elbow injection #2  Diagnostic left GONB #2  Palliative right CESI(Series #2) Palliative bilateral cervical facet block  Palliative right cervical facet RFA#4 Palliative left cervical facet RFA #3     Recent Visits Date Type Provider Dept  04/03/20 Office Visit Milinda Pointer, MD [facility]  03/14/20 Procedure visit Milinda Pointer, MD [facility]  01/17/20 Office Visit Milinda Pointer, MD [facility]  Showing recent visits within past 90 days and meeting all other requirements Today's Visits Date Type Provider Dept  04/13/20 Procedure visit Milinda Pointer, MD [facility]  Showing today's visits and meeting all other requirements Future Appointments Date Type Provider Dept  05/25/20 Appointment Milinda Pointer, MD [facility]  06/26/20 Appointment Milinda Pointer, MD [facility]  Showing future appointments within next 90 days and meeting all other requirements  Disposition: Discharge home  Discharge (Date  Time): 04/13/2020; 1410 hrs.   Primary Care Physician: Henrietta Hoover, MD Location: [facility] Note by: Gaspar Cola, MD Date: 04/13/2020; Time: 2:10 PM  Disclaimer:  Medicine is not an Chief Strategy Officer. The only guarantee in medicine is that nothing is guaranteed. It is important to note that the decision to proceed  with this intervention was based on the information collected from the patient. The Data and conclusions were drawn from the patient's questionnaire, the interview, and the physical examination. Because the information was provided in large part by the patient, it cannot be guaranteed that it has not been purposely or unconsciously manipulated. Every effort has been made to obtain as much relevant data as possible for this evaluation. It is important to note that the conclusions that lead to this procedure are derived in large part from the available data. Always take into account that the treatment will also be dependent on availability of resources and existing treatment guidelines, considered by other Pain Management Practitioners as being common knowledge and practice, at the time of the intervention. For Medico-Legal purposes, it is also important to point out that variation in procedural techniques and pharmacological choices are the acceptable norm. The indications, contraindications, technique, and results of the above procedure should only be interpreted and judged by a Board-Certified Interventional Pain Specialist with extensive familiarity and expertise in the same exact procedure and technique.

## 2020-04-14 ENCOUNTER — Telehealth: Payer: Self-pay

## 2020-04-14 NOTE — Telephone Encounter (Signed)
Post procedure phone call. Patient states she is doing good.  

## 2020-04-20 ENCOUNTER — Ambulatory Visit (INDEPENDENT_AMBULATORY_CARE_PROVIDER_SITE_OTHER): Payer: Medicaid Other

## 2020-04-20 ENCOUNTER — Ambulatory Visit
Admission: EM | Admit: 2020-04-20 | Discharge: 2020-04-20 | Disposition: A | Discharge status: Home / Self Care | Payer: Medicaid Other | Attending: Family Medicine | Admitting: Family Medicine

## 2020-04-20 ENCOUNTER — Other Ambulatory Visit: Payer: Self-pay

## 2020-04-20 DIAGNOSIS — R0602 Shortness of breath: Secondary | ICD-10-CM

## 2020-04-20 DIAGNOSIS — J209 Acute bronchitis, unspecified: Secondary | ICD-10-CM | POA: Diagnosis not present

## 2020-04-20 DIAGNOSIS — R062 Wheezing: Secondary | ICD-10-CM

## 2020-04-20 MED ORDER — PREDNISONE 50 MG PO TABS
ORAL_TABLET | ORAL | 0 refills | Status: DC
Start: 2020-04-20 — End: 2020-06-26

## 2020-04-20 MED ORDER — PROMETHAZINE-DM 6.25-15 MG/5ML PO SYRP: 5.0000 mL | ORAL_SOLUTION | Freq: Four times a day (QID) | ORAL | 0 refills | Status: DC | PRN

## 2020-04-20 MED ORDER — DOXYCYCLINE HYCLATE 100 MG PO CAPS: 100.0000 mg | ORAL_CAPSULE | Freq: Two times a day (BID) | ORAL | 0 refills | Status: DC

## 2020-04-20 MED ORDER — ALBUTEROL SULFATE HFA 108 (90 BASE) MCG/ACT IN AERS: 1.0000 | INHALATION_SPRAY | Freq: Four times a day (QID) | RESPIRATORY_TRACT | 0 refills | Status: DC | PRN

## 2020-04-20 NOTE — ED Triage Notes (Addendum)
Pt c/o productive cough, chest congestion, nasal congestion and elevated temps (101 last night) for several days.  Pt also has area on the back of her neck from a procedure to "burn the nerves" that was done last week. Pt is concerned area may be infected.

## 2020-04-20 NOTE — ED Provider Notes (Signed)
[facility]    CSN: [id number] Arrival date & time: 04/20/20  1052      History   Chief Complaint Chief Complaint  Patient presents with  . Cough  . Nasal Congestion  . Skin irritation   HPI  60 year old female presents with the above complaints.  Patient reports that she has had symptoms of the past several days.  Reports productive cough, nasal congestion.  She states that her cough is severe.  Productive of discolored sputum.  She reports that she has had fever as well.  Currently afebrile.  Patient has chronic pain.  Currently rates her pain is 8/10 in severity.  Patient recently had a procedure done and has superficial wound to the upper thoracic region on the right side.  She feels very poorly.  No relieving factors.  No other complaints  Past Medical History:  Diagnosis Date  . Anxiety   . Bladder infection    8/18  . Chronic lower back pain   . Chronic neck pain   . Collagen vascular disease ([place])   . Depression   . Diverticulitis   . Hyperlipidemia   . Lupus ([place])   . Overactive bladder     Patient Active Problem List   Diagnosis Date Noted  . Arthropathy of cervical facet joint (Bilateral) 04/13/2020  . Cervical spine pain 04/13/2020  . Abnormal MRI, cervical spine (09/20/2015) 04/13/2020  . Cervical central spinal stenosis (Multilevel) 04/13/2020  . Cervical foraminal stenosis (Right: C3-4) (Bilateral: C4-5, C5-6) 04/13/2020  . DDD (degenerative disc disease), cervical 03/14/2020  . Chronic shoulder pain (Left) 09/06/2019  . Osteoarthritis of shoulder (Left) 09/06/2019  . 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 Osteoarthritis of AC (acromioclavicular) joint (Right) 04/02/2018  . Chronic acromioclavicular joint pain (Right) 04/02/2018  . Chronic shoulder pain (Right) 04/02/2018  . Epicondylitis elbow, medial (Left) 04/02/2018  . Chronic elbow pain (Left) 03/25/2018  . Occipital headache (Left) 12/10/2017  . Neurogenic  pain 12/10/2017  . Cervico-occipital neuralgia (Left) 12/10/2017  . Pharmacologic therapy 09/01/2017  . Disorder of skeletal system 09/01/2017  . Problems influencing health status 09/01/2017  . Spondylosis without myelopathy or radiculopathy, cervical region 08/05/2017  . Cervicalgia 08/05/2017  . Rash 05/13/2017  . Acute postoperative pain 06/26/2016  . Cervical spondylosis 04/15/2016  . Cervical facet syndrome (Bilateral) (R>L) 04/15/2016  . Shoulder radicular pain (Bilateral) (R>L) 04/15/2016  . Chronic musculoskeletal pain 03/07/2016  . Vitamin D insufficiency 12/19/2015  . Anxiety 11/23/2015  . Chronic low back pain (3ry area of Pain) (Bilateral) (R>L) 11/23/2015  . Chronic neck pain (1ry area of Pain) (Bilateral) (R>L) 11/23/2015  . Long term prescription benzodiazepine use 11/23/2015  . Long term current use of opiate analgesic 11/23/2015  . Long term prescription opiate use 11/23/2015  . Opiate use (10 MME/Day) 11/23/2015  . Chronic pain syndrome 11/23/2015  . Chronic upper extremity pain (Right) 11/23/2015  . Chronic cervical radicular pain (Right) 11/23/2015  . Chronic shoulder pain (Bilateral) (R>L) 11/23/2015  . Hypercholesterolemia 06/20/2015  . 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 Encounter for long-term (current) use of high-risk medication 06/06/2015  . Pain medication agreement signed 11/24/2014  . Hyperlipidemia 01/25/2014  . 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 Diverticulitis 10/04/2012  . Osteoarthritis 10/04/2012  . Chronic upper back pain (2ry area of Pain) (Bilateral) (R>L) 08/17/2012  . Urinary incontinence 08/17/2012  . Fibromyalgia 06/04/2012  . SLE (systemic lupus erythematosus) ([place]) 02/18/2012  . Generalized anxiety disorder 01/07/2012  . Major depressive  disorder, recurrent ([place]) 09/29/2009    Past Surgical History:  Procedure Laterality Date  . ABDOMINAL HYSTERECTOMY    . COLONOSCOPY    . COLONOSCOPY WITH PROPOFOL N/A 07/03/2017    Procedure: COLONOSCOPY WITH PROPOFOL;  Surgeon: [name], MD;  Location[facility] ENDOSCOPY;  Service: Endoscopy;  Laterality: N/A;  . COLONOSCOPY WITH PROPOFOL N/A 10/21/2017   Procedure: COLONOSCOPY WITH PROPOFOL;  Surgeon: [name], MD;  Location: [facility] ENDOSCOPY;  Service: Endoscopy;  Laterality: N/A;  . ESOPHAGOGASTRODUODENOSCOPY N/A 10/21/2017   Procedure: ESOPHAGOGASTRODUODENOSCOPY (EGD);  Surgeon: [name], MD;  Location: [facility] ENDOSCOPY;  Service: Endoscopy;  Laterality: N/A;  . TONSILLECTOMY      OB History   No obstetric history on file.      Home Medications    Prior to Admission medications   Medication Sig Start Date End Date Taking? Authorizing Provider  albuterol (VENTOLIN HFA) 108 (90 Base) MCG/ACT inhaler Inhale 1-2 puffs into the lungs every 6 (six) hours as needed for wheezing or shortness of breath. 04/20/20  Yes Danira Nylander G, DO  atorvastatin (LIPITOR) 80 MG tablet Take 80 mg by mouth daily. 04/19/20  Yes [provider]  busPIRone (BUSPAR) 15 MG tablet Take 30 mg by mouth 2 (two) times daily as needed. 04/19/20  Yes [provider]  doxycycline (VIBRAMYCIN) 100 MG capsule Take 1 capsule (100 mg total) by mouth 2 (two) times daily. 04/20/20  Yes Diogenes Whirley G, DO  DULoxetine (CYMBALTA) 30 MG capsule Take 30 mg by mouth daily.   Yes [provider]  gabapentin (NEURONTIN) 300 MG capsule Take 1 capsule (300 mg total) by mouth 3 (three) times daily. 12/22/19 03/21/20 Yes Milinda Pointer, MD  HYDROcodone-acetaminophen (NORCO/VICODIN) 5-325 MG tablet Take 1 tablet by mouth every 8 (eight) hours as needed for up to 7 days for severe pain. Must last 7 days. 04/13/20 04/20/20 Yes Milinda Pointer, MD  hydroxychloroquine (PLAQUENIL) 200 MG tablet Take 200 mg by mouth daily.   Yes [provider]  oxyCODONE (OXY IR/ROXICODONE) 5 MG immediate release tablet Take 1 tablet (5 mg total) by mouth 2 (two) times daily. Must last 30  days 04/04/20 05/04/20 Yes Milinda Pointer, MD  predniSONE (DELTASONE) 50 MG tablet 1 tablet daily x 5 days 04/20/20  Yes Vasilis Luhman G, DO  promethazine-dextromethorphan (PROMETHAZINE-DM) 6.25-15 MG/5ML syrup Take 5 mLs by mouth 4 (four) times daily as needed for cough. 04/20/20  Yes Lorann Tani G, DO  baclofen (LIORESAL) 10 MG tablet Take 1-2 tablets (10-20 mg total) by mouth at bedtime. Patient not taking: No sig reported 12/22/19 03/21/20  Milinda Pointer, MD  busPIRone (BUSPAR) 10 MG tablet Take 10 mg by mouth in the morning, at noon, in the evening, and at bedtime.    [provider]  HYDROcodone-acetaminophen (NORCO/VICODIN) 5-325 MG tablet Take 1 tablet by mouth every 8 (eight) hours as needed for up to 7 days for severe pain. Must last for 7 days. 04/20/20 04/27/20  Milinda Pointer, MD  NICOTINE STEP 1 21 MG/24HR patch  11/29/19   [provider]  oxyCODONE (OXY IR/ROXICODONE) 5 MG immediate release tablet Take 1 tablet (5 mg total) by mouth 2 (two) times daily. Must last 30 days 05/05/20 06/04/20  Milinda Pointer, MD  oxyCODONE (OXY IR/ROXICODONE) 5 MG immediate release tablet Take 1 tablet (5 mg total) by mouth 2 (two) times daily. Must last 30 days 06/04/20 07/04/20  Milinda Pointer, MD    Family History Family History  Problem Relation Age of Onset  . Cancer Mother   . Hypertension Mother   . Emphysema Mother   . Stroke Mother   . Glaucoma Father   . Heart disease Father   . Diabetes Brother     Social History Social History   Tobacco Use  . Smoking status: Current Every Day Smoker    Packs/day: 0.50    Years: 35.00    Pack years: 17.50    Types: Cigarettes  . Smokeless tobacco: Never Used  Vaping Use  . Vaping Use: Never used  Substance Use Topics  . Alcohol use: No  . Drug use: No     Allergies   Pregabalin, Trazodone, Bupropion, Methocarbamol, and Tramadol   Review of Systems Review of Systems Per HPI  Physical Exam Triage Vital  Signs ED Triage Vitals  Enc Vitals Group     BP 04/20/20 1131 108/80     Pulse Rate 04/20/20 1131 89     Resp 04/20/20 1131 18     Temp 04/20/20 1131 98.1 F (36.7 C)     Temp Source 04/20/20 1131 Oral     SpO2 04/20/20 1131 96 %     Weight 04/20/20 1128 151 lb (68.5 kg)     Height 04/20/20 1128 5\' 3"  (1.6 m)     Head Circumference --      Peak Flow --      Pain Score 04/20/20 1128 8     Pain Loc --      Pain Edu? --      Excl. in [place]? --    Updated Vital Signs BP 108/80 (BP Location: Left Arm)   Pulse 89   Temp 98.1 F (36.7 C) (Oral)   Resp 18   Ht 5\' 3"  (1.6 m)   Wt 68.5 kg   SpO2 96%   BMI 26.75 kg/m   Visual Acuity Right Eye Distance:   Left Eye Distance:   Bilateral Distance:    Right Eye Near:   Left Eye Near:    Bilateral Near:     Physical Exam Vitals and nursing note reviewed.  Constitutional:      General: She is not in acute distress.    Comments: Appears ill.  HENT:     Head: Normocephalic and atraumatic.  Eyes:     General:        Right eye: No discharge.        Left eye: No discharge.     Conjunctiva/sclera: Conjunctivae normal.  Cardiovascular:     Rate and Rhythm: Normal rate and regular rhythm.  Pulmonary:     Effort: Pulmonary effort is normal.     Comments: Diffuse wheezing. Neurological:     Mental Status: She is alert.  Psychiatric:        Mood and Affect: Mood normal.        Behavior: Behavior normal.    UC Treatments / Results  Labs (all labs ordered are listed, but only abnormal results are displayed) Labs Reviewed - No data to display  EKG   Radiology DG Chest 2 View  Result Date: 04/20/2020 CLINICAL DATA:  Cough and congestion EXAM: CHEST - 2 VIEW COMPARISON:  Jun 17, 2018 FINDINGS: Lungs are clear. Heart size and pulmonary vascularity are normal. No adenopathy. No pneumothorax. No bone lesions. IMPRESSION: Lungs clear.  Cardiac silhouette normal. Electronically Signed   By: Lowella Grip III M.D.   On:  04/20/2020 12:24    Procedures Procedures (including critical  care time)  Medications Ordered in UC Medications - No data to display  Initial Impression / Assessment and Plan / UC Course  I have reviewed the triage vital signs and the nursing notes.  Pertinent labs & imaging results that were available during my care of the patient were reviewed by me and considered in my medical decision making (see chart for details).    61 year old female presents with acute bronchitis with associated wheezing and shortness of breath.  Chest x-ray was obtained.  Chest x-ray independently reviewed by me.  Interpretation: No acute findings.  No evidence of pneumonia.  Given her longstanding smoking history, I am concerned that she has underlying COPD.  As result, I am treating her as a with a COPD exacerbation.  Albuterol, prednisone, doxycycline as prescribed.  Promethazine DM for cough.  Final Clinical Impressions(s) / UC Diagnoses   Final diagnoses:  Acute bronchitis, unspecified organism  Wheezing  SOB (shortness of breath)   Discharge Instructions   None    ED Prescriptions    Medication Sig Dispense Auth. Provider   albuterol (VENTOLIN HFA) 108 (90 Base) MCG/ACT inhaler Inhale 1-2 puffs into the lungs every 6 (six) hours as needed for wheezing or shortness of breath. 18 g [place] G, DO   predniSONE (DELTASONE) 50 MG tablet 1 tablet daily x 5 days 5 tablet Sidni Fusco G, DO   doxycycline (VIBRAMYCIN) 100 MG capsule Take 1 capsule (100 mg total) by mouth 2 (two) times daily. 14 capsule Rosana Farnell G, DO   promethazine-dextromethorphan (PROMETHAZINE-DM) 6.25-15 MG/5ML syrup Take 5 mLs by mouth 4 (four) times daily as needed for cough. 118 mL Coral Spikes, DO     PDMP not reviewed this encounter.   Coral Spikes, [place] 04/20/20 1900

## 2020-05-03 ENCOUNTER — Other Ambulatory Visit: Payer: Self-pay | Admitting: Pain Medicine

## 2020-05-03 DIAGNOSIS — G894 Chronic pain syndrome: Secondary | ICD-10-CM

## 2020-05-24 ENCOUNTER — Encounter: Payer: Self-pay | Admitting: Pain Medicine

## 2020-05-25 ENCOUNTER — Other Ambulatory Visit: Payer: Self-pay

## 2020-05-25 ENCOUNTER — Ambulatory Visit: Payer: Medicaid Other | Attending: Pain Medicine | Admitting: Pain Medicine

## 2020-05-25 DIAGNOSIS — M25511 Pain in right shoulder: Secondary | ICD-10-CM

## 2020-05-25 DIAGNOSIS — M549 Dorsalgia, unspecified: Secondary | ICD-10-CM | POA: Diagnosis not present

## 2020-05-25 DIAGNOSIS — G894 Chronic pain syndrome: Secondary | ICD-10-CM

## 2020-05-25 DIAGNOSIS — M542 Cervicalgia: Secondary | ICD-10-CM

## 2020-05-25 DIAGNOSIS — M25512 Pain in left shoulder: Secondary | ICD-10-CM

## 2020-05-25 DIAGNOSIS — M5481 Occipital neuralgia: Secondary | ICD-10-CM | POA: Diagnosis not present

## 2020-05-25 DIAGNOSIS — G8929 Other chronic pain: Secondary | ICD-10-CM

## 2020-05-25 DIAGNOSIS — M47812 Spondylosis without myelopathy or radiculopathy, cervical region: Secondary | ICD-10-CM

## 2020-05-25 NOTE — Progress Notes (Signed)
Patient: Tanya [name]  Service Category: E/M  Provider: Gaspar Cola, MD  DOB: Dec 31, 1960  DOS: 05/25/2020  Location: Office  MRN: [id number]  Setting: Ambulatory outpatient  Referring Provider: Lorenza [name]*  Type: Established Patient  Specialty: Interventional Pain Management  PCP: Tanya Hoover, MD  Location: Remote location  Delivery: TeleHealth     Virtual Encounter - Pain Management PROVIDER NOTE: Information contained herein reflects review and annotations entered in association with encounter. Interpretation of such information and data should be left to medically-trained personnel. Information provided to patient can be located elsewhere in the medical record under "Patient Instructions". Document created using STT-dictation technology, any transcriptional errors that may result from process are unintentional.    Contact & Pharmacy Preferred: [phone] Home: [phone] (home) Mobile: [phone] (mobile) E-mail: [email or web address]  [place], [place] [address] Phone: [phone] Fax: [phone]   Pre-screening  Tanya [name] offered "in-person" vs "virtual" encounter. She indicated preferring virtual for this encounter.   Reason COVID-19*  Social distancing based on CDC and AMA recommendations.   I contacted Tanya [name] on 05/25/2020 via telephone.      I clearly identified myself as Tanya Cola, MD. I verified that I was speaking with the correct person using two identifiers (Name: Tanya [name], and date of birth: 09/10/1960).  Consent I sought verbal advanced consent from Tanya [name] for virtual visit interactions. I informed Tanya [name] of possible security and privacy concerns, risks, and limitations associated with providing "not-in-person" medical evaluation and management services. I also informed Tanya [name] of the availability of  "in-person" appointments. Finally, I informed her that there would be a charge for the virtual visit and that she could be  personally, fully or partially, financially responsible for it. Tanya [name] expressed understanding and agreed to proceed.   Historic Elements   Tanya [name] is a 60 y.o. year old, female patient evaluated today after our last contact on 05/03/2020. Tanya [name]  has a past medical history of Anxiety, Bladder infection, Chronic lower back pain, Chronic neck pain, Collagen vascular disease ([place]), Depression, Diverticulitis, Hyperlipidemia, Lupus ([place]), and Overactive bladder. She also  has a past surgical history that includes Abdominal hysterectomy; Colonoscopy; Colonoscopy with propofol (N/A, 07/03/2017); Tonsillectomy; Esophagogastroduodenoscopy (N/A, 10/21/2017); and Colonoscopy with propofol (N/A, 10/21/2017). Tanya [name] has a current medication list which includes the following prescription(s): albuterol, atorvastatin, buspirone, doxycycline, duloxetine, gabapentin, hydroxychloroquine, oxycodone, [START ON 06/04/2020] oxycodone, prednisone, promethazine-dextromethorphan, baclofen, and oxycodone. She  reports that she has been smoking cigarettes. She has a 17.50 pack-year smoking history. She has never used smokeless tobacco. She reports that she does not drink alcohol and does not use drugs. [name] is allergic to pregabalin, trazodone, bupropion, methocarbamol, and tramadol.   HPI  Today, she is being contacted for a post-procedure assessment.  The patient indicates that this time she developed a "sore" after the radiofrequency on the right side.  She indicates that this is healing and the pain is gone.  I told her that I would be more than glad to have her come by so that I can take a look at it and see if there is anything that we need to do.  She indicated that she does not feel that this is necessary since it is healing.  I again repeated to her that it  would not be a problem to take a quick look.  Post-Procedure Evaluation  Procedure (04/13/2020): Therapeutic right cervical facet RFA #3 under fluoroscopic guidance and IV sedation.  Left side done on 03/14/2020. Pre-procedure pain level: 4/10 Post-procedure: 0/10 (100% relief)  Sedation: Sedation provided.  Effectiveness during initial hour after procedure(Ultra-Short Term Relief): 100 %.  Local anesthetic used: Long-acting (4-6 hours) Effectiveness: Defined as any analgesic benefit obtained secondary to the administration of local anesthetics. This carries significant diagnostic value as to the etiological location, or anatomical origin, of the pain. Duration of benefit is expected to coincide with the duration of the local anesthetic used.  Effectiveness during initial 4-6 hours after procedure(Short-Term Relief): 100 %.  Long-term benefit: Defined as any relief past the pharmacologic duration of the local anesthetics.  Effectiveness past the initial 6 hours after procedure(Long-Term Relief): 75 % (ongoin).  Current benefits: Defined as benefit that persist at this time.   Analgesia:  The patient is currently enjoying an ongoing 75% relief of her neck pain. Function: Tanya [name] reports improvement in function ROM: Tanya [name] reports improvement in ROM  Pharmacotherapy Assessment  Analgesic: Oxycodone IR 5 mg, 1 tab PO BID (10 mg/day of oxycodone) MME/day: 15 mg/day.   Monitoring: [place] PMP: PDMP reviewed during this encounter.       Pharmacotherapy: No side-effects or adverse reactions reported. Compliance: No problems identified. Effectiveness: Clinically acceptable. Plan: Refer to "POC".  UDS:  Summary  Date Value Ref Range Status  07/01/2019 Note  Final    Comment:    ==================================================================== [employer] (MW) ==================================================================== Specimen Alert Note: Urinary creatinine  is low; ability to detect some drugs may be compromised. Interpret results with caution. (Creatinine) ==================================================================== Test                             Result       Flag       Units  Drug Present and Declared for Prescription Verification   Oxycodone                      1113         EXPECTED   ng/mg creat   Oxymorphone                    773          EXPECTED   ng/mg creat   Noroxycodone                   1840         EXPECTED   ng/mg creat    Sources of oxycodone include scheduled prescription medications.    Oxymorphone and noroxycodone are expected metabolites of oxycodone.    Oxymorphone is also available as a scheduled prescription medication.  ==================================================================== Test                      Result    Flag   Units      Ref Range   Creatinine              15        LL     mg/dL      >=20 ==================================================================== Declared Medications:  The flagging and interpretation on this report are based on the  following declared medications.  Unexpected results may arise from  inaccuracies in the declared medications.   **Note: The testing scope of this panel includes these medications:   Oxycodone (Roxicodone)   **  Note: The testing scope of this panel does not include the  following reported medications:   Albuterol (Ventolin HFA)  Atorvastatin (Lipitor)  Cyclobenzaprine (Flexeril)  Doxycycline (Doryx)  Duloxetine (Cymbalta)  Gabapentin (Neurontin)  Mirtazapine (Remeron)  Pantoprazole (Protonix)  Pregabalin (Lyrica)  Triamcinolone (Kenalog) ==================================================================== For clinical consultation, please call [phone]. ====================================================================     Laboratory Chemistry Profile   Renal Lab Results  Component Value Date   BUN 7 06/09/2019    CREATININE 0.72 06/09/2019   BCR 15 09/03/2017   GFRAA >60 06/09/2019   GFRNONAA >60 06/09/2019     Hepatic Lab Results  Component Value Date   AST 26 06/09/2019   ALT 20 06/09/2019   ALBUMIN 4.1 06/09/2019   ALKPHOS 29 (L) 06/09/2019   LIPASE 35 06/09/2019     Electrolytes Lab Results  Component Value Date   NA 135 06/09/2019   K 4.2 06/09/2019   CL 102 06/09/2019   CALCIUM 8.4 (L) 06/09/2019   MG 1.9 09/03/2017     Bone Lab Results  Component Value Date   25OHVITD1 25 (L) 09/03/2017   25OHVITD2 2.9 09/03/2017   25OHVITD3 22 09/03/2017     Inflammation (CRP: Acute Phase) (ESR: Chronic Phase) Lab Results  Component Value Date   CRP 2 09/03/2017   ESRSEDRATE 21 09/03/2017       Note: Above Lab results reviewed.  Imaging  DG Chest 2 View CLINICAL DATA:  Cough and congestion  EXAM: CHEST - 2 VIEW  COMPARISON:  Jun 17, 2018  FINDINGS: Lungs are clear. Heart size and pulmonary vascularity are normal. No adenopathy. No pneumothorax. No bone lesions.  IMPRESSION: Lungs clear.  Cardiac silhouette normal.  Electronically Signed   By: Lowella Grip III M.D.   On: 04/20/2020 12:24  Assessment  The primary encounter diagnosis was Cervical facet syndrome (Bilateral) (R>L). Diagnoses of Cervicalgia, Cervical spine pain, Cervico-occipital neuralgia (Left), Chronic upper back pain (2ry area of Pain) (Bilateral) (R>L), Chronic shoulder pain (Bilateral) (R>L), and Chronic pain syndrome were also pertinent to this visit.  Plan of Care  Problem-specific:  No problem-specific Assessment & Plan notes found for this encounter.  Tanya [name] has a current medication list which includes the following long-term medication(s): albuterol, gabapentin, oxycodone, [START ON 06/04/2020] oxycodone, baclofen, and oxycodone.  Pharmacotherapy (Medications Ordered): No orders of the defined types were placed in this encounter.  Orders:  No orders of the defined types  were placed in this encounter.  Follow-up plan:   Return for scheduled encounter.      Interventional Therapies  Risk  Complexity Considerations:   WNL   Planned  Pending:   Therapeutic/palliative left cervical facet RFA #3 (last done 10/23/2017)    Under consideration:   Diagnostic right suprascapular NB Diagnostic bilateral lumbar facet block   Completed:   Palliative right glenohumeral joint injection x2  Palliative right AC joint injection x3  Palliative left medial epicondyle elbow injection x1  Diagnostic left GONB x1  Palliative right CESI(Series x1) Palliative bilateral cervical facet block  Palliative right cervical facet RFAx3(04/13/2020) Palliative left cervical facet RFA x2 (10/23/2017)   Therapeutic  Palliative (PRN) options:   Palliative right glenohumeral joint injection #3  Palliative right AC joint injection #4  Palliative left medial epicondyle elbow injection #2  Diagnostic left GONB #2  Palliative right CESI(Series #2) Palliative bilateral cervical facet block  Palliative right cervical facet RFA#4 Palliative left cervical facet RFA #3      Recent Visits Date  Type Provider Dept  04/13/20 Procedure visit Milinda Pointer, MD [facility]  04/03/20 Office Visit Milinda Pointer, MD [facility]  03/14/20 Procedure visit Milinda Pointer, MD [facility]  Showing recent visits within past 90 days and meeting all other requirements Today's Visits Date Type Provider Dept  05/25/20 Telemedicine Milinda Pointer, MD [facility]  Showing today's visits and meeting all other requirements Future Appointments Date Type Provider Dept  06/26/20 Appointment Milinda Pointer, MD [facility]  Showing future appointments within next 90 days and meeting all other requirements  I discussed the assessment and treatment plan with the patient. The patient was provided an opportunity to ask questions  and all were answered. The patient agreed with the plan and demonstrated an understanding of the instructions.  Patient advised to call back or seek an in-person evaluation if the symptoms or condition worsens.  Duration of encounter: 16 minutes.  Note by: Tanya Cola, MD Date: 05/25/2020; Time: 4:32 PM

## 2020-06-25 DIAGNOSIS — Z79891 Long term (current) use of opiate analgesic: Secondary | ICD-10-CM | POA: Insufficient documentation

## 2020-06-25 NOTE — Progress Notes (Signed)
PROVIDER NOTE: Information contained herein reflects review and annotations entered in association with encounter. Interpretation of such information and data should be left to medically-trained personnel. Information provided to patient can be located elsewhere in the medical record under "Patient Instructions". Document created using STT-dictation technology, any transcriptional errors that may result from process are unintentional.    Patient: Tanya [name]  Service Category: E/M  Provider: Gaspar Cola, MD  DOB: February 27, 1960  DOS: 06/26/2020  Specialty: Interventional Pain Management  MRN: [id number]  Setting: Ambulatory outpatient  PCP: Henrietta Hoover, MD  Type: Established Patient    Referring Provider: [name]*  Location: Office  Delivery: Face-to-face     HPI  [name], a 60 y.o. year old female, is here today because of her Chronic pain syndrome [G89.4]. Tanya [name] primary complain today is Neck Pain Last encounter: My last encounter with her was on 05/03/2020. Pertinent problems: Tanya [name] has Chronic upper back pain (2ry area of Pain) (Bilateral) (R>L); Chronic low back pain (3ry area of Pain) (Bilateral) (R>L); Chronic neck pain (1ry area of Pain) (Bilateral) (R>L); Fibromyalgia; Osteoarthritis; Chronic pain syndrome; Chronic upper extremity pain (Right); Chronic cervical radicular pain (Right); Chronic shoulder pain (Bilateral) (R>L); Chronic musculoskeletal pain; Cervical spondylosis; Cervical facet syndrome (Bilateral) (R>L); Shoulder radicular pain (Bilateral) (R>L); Acute postoperative pain; Spondylosis without myelopathy or radiculopathy, cervical region; Cervicalgia; Occipital headache (Left); Neurogenic pain; Cervico-occipital neuralgia (Left); Chronic elbow pain (Left); Osteoarthritis of AC (acromioclavicular) joint (Right); Chronic acromioclavicular joint pain (Right); Chronic shoulder pain (Right); Epicondylitis elbow, medial  (Left); 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 Pain Assessment: Severity of Chronic pain is reported as a 3 /10. Location: Neck Left,Right/at times pain radiaties down her arm. Onset: More than a month ago. Quality: Aching,Burning,Constant,Tingling. Timing: Constant. Modifying factor(s): meds. Vitals:  height is _0  (1.6 m) and weight is 150 lb (68 kg). Her temperature is 96.8 F (36 C) (abnormal). Her blood pressure is 147/89 (abnormal) and her pulse is 90. Her oxygen saturation is 99%.   Reason for encounter: medication management.   The patient indicates doing well with the current medication regimen. No adverse reactions or side effects reported to the medications.   RTCB: 11/01/2020 Nonopioids transfer 12/22/2019: Baclofen and Neurontin  Pharmacotherapy Assessment   Analgesic: Oxycodone IR 5 mg, 1 tab PO BID (10 mg/day of oxycodone) MME/day: 15 mg/day.   Monitoring: [place] PMP: PDMP reviewed during this encounter.       Pharmacotherapy: No side-effects or adverse reactions reported. Compliance: No problems identified. Effectiveness: Clinically acceptable.  Chauncey Fischer, RN  06/26/2020  1:06 PM  Sign when Signing Visit Nursing Pain Medication Assessment:  Safety precautions to be maintained throughout the outpatient stay will include: orient to surroundings, keep bed in low position, maintain call bell within reach at all times, provide assistance with transfer out of bed and ambulation.  Medication Inspection Compliance: Pill count conducted under aseptic conditions, in front of the patient. Neither the pills nor the  bottle was removed from the patient's sight at any time. Once count was completed pills were immediately returned to the patient in their original bottle.  Medication: Oxycodone IR Pill/Patch Count: 0 of 60 pills remain  Pill/Patch Appearance: Markings consistent with prescribed medication Bottle Appearance: Standard pharmacy container. Clearly labeled. Filled Date: 5 / 36 / 2022 Last Medication intake:  YesterdaySafety precautions to be maintained throughout the outpatient stay will include: orient to surroundings, keep bed in low position, maintain call bell within reach at all times, provide assistance with transfer out of bed and ambulation.     UDS:  Summary  Date Value Ref Range Status  07/01/2019 Note  Final    Comment:    ==================================================================== [employer] Select 13 (MW) ==================================================================== Specimen Alert Note: Urinary creatinine is low; ability to detect some drugs may be compromised. Interpret results with caution. (Creatinine) ==================================================================== Test                             Result       Flag       Units  Drug Present and Declared for Prescription Verification   Oxycodone                      1113         EXPECTED   ng/mg creat   Oxymorphone                    773          EXPECTED   ng/mg creat   Noroxycodone                   1840         EXPECTED   ng/mg creat    Sources of oxycodone include scheduled prescription medications.    Oxymorphone and noroxycodone are expected metabolites of oxycodone.    Oxymorphone is also available as a scheduled prescription medication.  ==================================================================== Test                      Result    Flag   Units      Ref Range   Creatinine              15        LL     mg/dL       >=20 ==================================================================== Declared Medications:  The flagging and interpretation on this report are based on the  following declared medications.  Unexpected results may arise from  inaccuracies in the declared medications.   **Note: The testing scope of this panel includes these medications:   Oxycodone (Roxicodone)   **Note: The testing scope of this panel does not include the  following reported medications:   Albuterol (Ventolin HFA)  Atorvastatin (Lipitor)  Cyclobenzaprine (Flexeril)  Doxycycline (Doryx)  Duloxetine (Cymbalta)  Gabapentin (Neurontin)  Mirtazapine (Remeron)  Pantoprazole (Protonix)  Pregabalin (Lyrica)  Triamcinolone (Kenalog) ==================================================================== For clinical consultation, please call [phone]. ====================================================================      ROS  Constitutional: Denies any fever or chills Gastrointestinal: No reported hemesis, hematochezia, vomiting, or acute GI distress Musculoskeletal: Denies any acute onset joint swelling, redness, loss of ROM, or weakness Neurological: No reported episodes of acute onset apraxia, aphasia, dysarthria, agnosia, amnesia, paralysis, loss of coordination, or loss of consciousness  Medication Review  DULoxetine, atorvastatin, baclofen, busPIRone, gabapentin, and oxyCODONE  History Review  Allergy: Tanya [name] is allergic to pregabalin, trazodone, bupropion, methocarbamol, and tramadol. Drug: Tanya [name]  reports no history of drug use. Alcohol:  reports no history of alcohol use. Tobacco:  reports that she has been smoking cigarettes. She  has a 17.50 pack-year smoking history. She has never used smokeless tobacco. Social: Tanya [name]  reports that she has been smoking cigarettes. She has a 17.50 pack-year smoking history. She has never used smokeless tobacco. She reports that she does  not drink alcohol and does not use drugs. Medical:  has a past medical history of Anxiety, Bladder infection, Chronic lower back pain, Chronic neck pain, Collagen vascular disease ([place]), Depression, Diverticulitis, Hyperlipidemia, Lupus ([place]), and Overactive bladder. Surgical: Tanya [name]  has a past surgical history that includes Abdominal hysterectomy; Colonoscopy; Colonoscopy with propofol (N/A, 07/03/2017); Tonsillectomy; Esophagogastroduodenoscopy (N/A, 10/21/2017); and Colonoscopy with propofol (N/A, 10/21/2017). Family: family history includes Cancer in her mother; Diabetes in her brother; Emphysema in her mother; Glaucoma in her father; Heart disease in her father; Hypertension in her mother; Stroke in her mother.  Laboratory Chemistry Profile   Renal Lab Results  Component Value Date   BUN 7 06/09/2019   CREATININE 0.72 06/09/2019   BCR 15 09/03/2017   GFRAA >60 06/09/2019   GFRNONAA >60 06/09/2019     Hepatic Lab Results  Component Value Date   AST 26 06/09/2019   ALT 20 06/09/2019   ALBUMIN 4.1 06/09/2019   ALKPHOS 29 (L) 06/09/2019   LIPASE 35 06/09/2019     Electrolytes Lab Results  Component Value Date   NA 135 06/09/2019   K 4.2 06/09/2019   CL 102 06/09/2019   CALCIUM 8.4 (L) 06/09/2019   MG 1.9 09/03/2017     Bone Lab Results  Component Value Date   25OHVITD1 25 (L) 09/03/2017   25OHVITD2 2.9 09/03/2017   25OHVITD3 22 09/03/2017     Inflammation (CRP: Acute Phase) (ESR: Chronic Phase) Lab Results  Component Value Date   CRP 2 09/03/2017   ESRSEDRATE 21 09/03/2017       Note: Above Lab results reviewed.  Recent Imaging Review  DG Chest 2 View CLINICAL DATA:  Cough and congestion  EXAM: CHEST - 2 VIEW  COMPARISON:  Jun 17, 2018  FINDINGS: Lungs are clear. Heart size and pulmonary vascularity are normal. No adenopathy. No pneumothorax. No bone lesions.  IMPRESSION: Lungs clear.  Cardiac silhouette normal.  Electronically Signed   By:  Lowella Grip III M.D.   On: 04/20/2020 12:24 Note: Reviewed        Physical Exam  General appearance: Well nourished, well developed, and well hydrated. In no apparent acute distress Mental status: Alert, oriented x 3 (person, place, & time)       Respiratory: No evidence of acute respiratory distress Eyes: PERLA Vitals: BP (!) 147/89   Pulse 90   Temp (!) 96.8 F (36 C)   Ht _0  (1.6 m)   Wt 150 lb (68 kg)   SpO2 99%   BMI 26.57 kg/m  BMI: Estimated body mass index is 26.57 kg/m as calculated from the following:   Height as of this encounter: _1  (1.6 m).   Weight as of this encounter: 150 lb (68 kg). Ideal: Ideal body weight: 52.4 kg (115 lb 8.3 oz) Adjusted ideal body weight: 58.7 kg (129 lb 5 oz)  Assessment   Status Diagnosis  Controlled Controlled Controlled 1. Chronic pain syndrome   2. Chronic neck pain (1ry area of Pain) (Bilateral) (R>L)   3. Chronic upper back pain (2ry area of Pain) (Bilateral) (R>L)   4. Chronic low back pain (3ry area of Pain) (Bilateral) (R>L)   5. Fibromyalgia   6. Pharmacologic therapy   7.  Chronic use of opiate for therapeutic purpose   8. Chronic musculoskeletal pain   9. Neurogenic pain      Updated Problems: No problems updated.  Plan of Care  Problem-specific:  No problem-specific Assessment & Plan notes found for this encounter.  Tanya [name] has a current medication list which includes the following long-term medication(s): oxycodone, baclofen, gabapentin, [START ON 08/03/2020] oxycodone, [START ON 09/02/2020] oxycodone, and [START ON 10/02/2020] oxycodone.  Pharmacotherapy (Medications Ordered): Meds ordered this encounter  Medications  . oxyCODONE (OXY IR/ROXICODONE) 5 MG immediate release tablet    Sig: Take 1 tablet (5 mg total) by mouth 2 (two) times daily. Must last 30 days    Dispense:  60 tablet    Refill:  0    Not a duplicate. Do NOT delete! Dispense 1 day early if closed on refill date. Avoid  benzodiazepines within 8 hours of opioids. Do not send refill requests.  Marland Kitchen oxyCODONE (OXY IR/ROXICODONE) 5 MG immediate release tablet    Sig: Take 1 tablet (5 mg total) by mouth 2 (two) times daily. Must last 30 days    Dispense:  60 tablet    Refill:  0    Not a duplicate. Do NOT delete! Dispense 1 day early if closed on refill date. Avoid benzodiazepines within 8 hours of opioids. Do not send refill requests.  Marland Kitchen oxyCODONE (OXY IR/ROXICODONE) 5 MG immediate release tablet    Sig: Take 1 tablet (5 mg total) by mouth 2 (two) times daily. Must last 30 days    Dispense:  60 tablet    Refill:  0    Not a duplicate. Do NOT delete! Dispense 1 day early if closed on refill date. Avoid benzodiazepines within 8 hours of opioids. Do not send refill requests.  . baclofen (LIORESAL) 10 MG tablet    Sig: Take 1-2 tablets (10-20 mg total) by mouth at bedtime.    Dispense:  60 tablet    Refill:  2    Fill one day early if pharmacy is closed on scheduled refill date. Generic permitted. Do not send renewal requests. Void any older duplicate prescription or refill(s) that may be on file.  . gabapentin (NEURONTIN) 300 MG capsule    Sig: Take 1 capsule (300 mg total) by mouth 3 (three) times daily.    Dispense:  90 capsule    Refill:  2    Fill one day early if pharmacy is closed on scheduled refill date. Generic permitted. Do not send renewal requests. Void any older duplicate prescription or refill(s) that may be on file.   Orders:  Orders Placed This Encounter  Procedures  . [employer] Select 13 (MW), Urine    Volume: 30 ml(s). Minimum 3 ml of urine is needed. Document temperature of fresh sample. Indications: Long term (current) use of opiate analgesic (W09.811)    Order Specific Question:   Release to patient    Answer:   Immediate   Follow-up plan:   Return in about 4 months (around 11/01/2020) for evaluation day (F2F) (MM).      Interventional Therapies  Risk  Complexity Considerations:    WNL   Planned  Pending:   Therapeutic/palliative left cervical facet RFA #3 (last done 10/23/2017)    Under consideration:   Diagnostic right suprascapular NB Diagnostic bilateral lumbar facet block   Completed:   Palliative right glenohumeral joint injection x2  Palliative right AC joint injection x3  Palliative left medial epicondyle elbow injection x1  Diagnostic left GONB x1  Palliative right CESI(Series x1) Palliative bilateral cervical facet block  Palliative right cervical facet RFAx3(04/13/2020) Palliative left cervical facet RFA x2 (10/23/2017)   Therapeutic  Palliative (PRN) options:   Palliative right glenohumeral joint injection #3  Palliative right AC joint injection #4  Palliative left medial epicondyle elbow injection #2  Diagnostic left GONB #2  Palliative right CESI(Series #2) Palliative bilateral cervical facet block  Palliative right cervical facet RFA#4 Palliative left cervical facet RFA #3     Recent Visits Date Type Provider Dept  05/25/20 Telemedicine Milinda Pointer, MD [facility]  04/13/20 Procedure visit Milinda Pointer, MD [facility]  04/03/20 Office Visit Milinda Pointer, MD [facility]  Showing recent visits within past 90 days and meeting all other requirements Today's Visits Date Type Provider Dept  06/26/20 Office Visit Milinda Pointer, MD [facility]  Showing today's visits and meeting all other requirements Future Appointments No visits were found meeting these conditions. Showing future appointments within next 90 days and meeting all other requirements  I discussed the assessment and treatment plan with the patient. The patient was provided an opportunity to ask questions and all were answered. The patient agreed with the plan and demonstrated an understanding of the instructions.  Patient advised to call back or seek an in-person evaluation if the symptoms or condition  worsens.  Duration of encounter: 30 minutes.  Note by: Gaspar Cola, MD Date: 06/26/2020; Time: 2:05 PM

## 2020-06-26 ENCOUNTER — Other Ambulatory Visit: Payer: Self-pay

## 2020-06-26 ENCOUNTER — Ambulatory Visit: Payer: Medicaid Other | Attending: Pain Medicine | Admitting: Pain Medicine

## 2020-06-26 ENCOUNTER — Encounter: Payer: Self-pay | Admitting: Pain Medicine

## 2020-06-26 VITALS — BP 147/89 | HR 90 | Temp 96.8°F | Ht 63.0 in | Wt 150.0 lb

## 2020-06-26 DIAGNOSIS — M797 Fibromyalgia: Secondary | ICD-10-CM

## 2020-06-26 DIAGNOSIS — M792 Neuralgia and neuritis, unspecified: Secondary | ICD-10-CM | POA: Diagnosis present

## 2020-06-26 DIAGNOSIS — M542 Cervicalgia: Secondary | ICD-10-CM

## 2020-06-26 DIAGNOSIS — Z79899 Other long term (current) drug therapy: Secondary | ICD-10-CM

## 2020-06-26 DIAGNOSIS — G894 Chronic pain syndrome: Secondary | ICD-10-CM | POA: Diagnosis not present

## 2020-06-26 DIAGNOSIS — G8929 Other chronic pain: Secondary | ICD-10-CM | POA: Diagnosis present

## 2020-06-26 DIAGNOSIS — Z79891 Long term (current) use of opiate analgesic: Secondary | ICD-10-CM | POA: Diagnosis present

## 2020-06-26 DIAGNOSIS — M545 Low back pain, unspecified: Secondary | ICD-10-CM | POA: Diagnosis not present

## 2020-06-26 DIAGNOSIS — M549 Dorsalgia, unspecified: Secondary | ICD-10-CM | POA: Diagnosis present

## 2020-06-26 DIAGNOSIS — M7918 Myalgia, other site: Secondary | ICD-10-CM | POA: Diagnosis present

## 2020-06-26 MED ORDER — OXYCODONE HCL 5 MG PO TABS: 5.0000 mg | ORAL_TABLET | Freq: Two times a day (BID) | ORAL | 0 refills | Status: DC

## 2020-06-26 MED ORDER — BACLOFEN 10 MG PO TABS: 10.0000 mg | ORAL_TABLET | Freq: Every day | ORAL | 2 refills | Status: DC

## 2020-06-26 MED ORDER — GABAPENTIN 300 MG PO CAPS: 300.0000 mg | ORAL_CAPSULE | Freq: Three times a day (TID) | ORAL | 2 refills | Status: AC

## 2020-06-26 NOTE — Progress Notes (Signed)
Nursing Pain Medication Assessment:  Safety precautions to be maintained throughout the outpatient stay will include: orient to surroundings, keep bed in low position, maintain call bell within reach at all times, provide assistance with transfer out of bed and ambulation.  Medication Inspection Compliance: Pill count conducted under aseptic conditions, in front of the patient. Neither the pills nor the bottle was removed from the patient's sight at any time. Once count was completed pills were immediately returned to the patient in their original bottle.  Medication: Oxycodone IR Pill/Patch Count: 0 of 60 pills remain Pill/Patch Appearance: Markings consistent with prescribed medication Bottle Appearance: Standard pharmacy container. Clearly labeled. Filled Date: 5 / 12 / 2022 Last Medication intake:  YesterdaySafety precautions to be maintained throughout the outpatient stay will include: orient to surroundings, keep bed in low position, maintain call bell within reach at all times, provide assistance with transfer out of bed and ambulation.

## 2020-06-26 NOTE — Patient Instructions (Signed)
____________________________________________________________________________________________  Medication Rules  Purpose: To inform patients, and their family members, of our rules and regulations.  Applies to: All patients receiving prescriptions (written or electronic).  Pharmacy of record: Pharmacy where electronic prescriptions will be sent. If written prescriptions are taken to a different pharmacy, please inform the nursing staff. The pharmacy listed in the electronic medical record should be the one where you would like electronic prescriptions to be sent.  Electronic prescriptions: In compliance with the [place] Strengthen Opioid Misuse Prevention (STOP) Act of 2017 (Session Law 2017-74/H243), effective January 21, 2018, all controlled substances must be electronically prescribed. Calling prescriptions to the pharmacy will cease to exist.  Prescription refills: Only during scheduled appointments. Applies to all prescriptions.  NOTE: The following applies primarily to controlled substances (Opioid* Pain Medications).   Type of encounter (visit): For patients receiving controlled substances, face-to-face visits are required. (Not an option or up to the patient.)  Patient's responsibilities: 1. Pain Pills: Bring all pain pills to every appointment (except for procedure appointments). 2. Pill Bottles: Bring pills in original pharmacy bottle. Always bring the newest bottle. Bring bottle, even if empty. 3. Medication refills: You are responsible for knowing and keeping track of what medications you take and those you need refilled. The day before your appointment: write a list of all prescriptions that need to be refilled. The day of the appointment: give the list to the admitting nurse. Prescriptions will be written only during appointments. No prescriptions will be written on procedure days. If you forget a medication: it will not be "Called in", "Faxed", or "electronically sent".  You will need to get another appointment to get these prescribed. No early refills. Do not call asking to have your prescription filled early. 4. Prescription Accuracy: You are responsible for carefully inspecting your prescriptions before leaving our office. Have the discharge nurse carefully go over each prescription with you, before taking them home. Make sure that your name is accurately spelled, that your address is correct. Check the name and dose of your medication to make sure it is accurate. Check the number of pills, and the written instructions to make sure they are clear and accurate. Make sure that you are given enough medication to last until your next medication refill appointment. 5. Taking Medication: Take medication as prescribed. When it comes to controlled substances, taking less pills or less frequently than prescribed is permitted and encouraged. Never take more pills than instructed. Never take medication more frequently than prescribed.  6. Inform other Doctors: Always inform, all of your healthcare providers, of all the medications you take. 7. Pain Medication from other Providers: You are not allowed to accept any additional pain medication from any other Doctor or Healthcare provider. There are two exceptions to this rule. (see below) In the event that you require additional pain medication, you are responsible for notifying us, as stated below. 8. Cough Medicine: Often these contain an opioid, such as codeine or hydrocodone. Never accept or take cough medicine containing these opioids if you are already taking an opioid* medication. The combination may cause respiratory failure and death. 9. Medication Agreement: You are responsible for carefully reading and following our Medication Agreement. This must be signed before receiving any prescriptions from our practice. Safely store a copy of your signed Agreement. Violations to the Agreement will result in no further prescriptions.  (Additional copies of our Medication Agreement are available upon request.) 10. Laws, Rules, & Regulations: All patients are expected to follow all   Federal and State Laws, Statutes, Rules, & Regulations. Ignorance of the Laws does not constitute a valid excuse.  11. Illegal drugs and Controlled Substances: The use of illegal substances (including, but not limited to marijuana and its derivatives) and/or the illegal use of any controlled substances is strictly prohibited. Violation of this rule may result in the immediate and permanent discontinuation of any and all prescriptions being written by our practice. The use of any illegal substances is prohibited. 12. Adopted CDC guidelines & recommendations: Target dosing levels will be at or below 60 MME/day. Use of benzodiazepines** is not recommended.  Exceptions: There are only two exceptions to the rule of not receiving pain medications from other Healthcare Providers. 1. Exception #1 (Emergencies): In the event of an emergency (i.e.: accident requiring emergency care), you are allowed to receive additional pain medication. However, you are responsible for: As soon as you are able, call our office [phone], at any time of the day or night, and leave a message stating your name, the date and nature of the emergency, and the name and dose of the medication prescribed. In the event that your call is answered by a member of our staff, make sure to document and save the date, time, and the name of the person that took your information.  2. Exception #2 (Planned Surgery): In the event that you are scheduled by another doctor or dentist to have any type of surgery or procedure, you are allowed (for a period no longer than 30 days), to receive additional pain medication, for the acute post-op pain. However, in this case, you are responsible for picking up a copy of our "Post-op Pain Management for Surgeons" handout, and giving it to your surgeon or dentist. This  document is available at our office, and does not require an appointment to obtain it. Simply go to our office during business hours (Monday-Thursday from 8:00 AM to 4:00 PM) (Friday 8:00 AM to 12:00 Noon) or if you have a scheduled appointment with us, prior to your surgery, and ask for it by name. In addition, you are responsible for: calling our office [phone], at any time of the day or night, and leaving a message stating your name, name of your surgeon, type of surgery, and date of procedure or surgery. Failure to comply with your responsibilities may result in termination of therapy involving the controlled substances.  *Opioid medications include: morphine, codeine, oxycodone, oxymorphone, hydrocodone, hydromorphone, meperidine, tramadol, tapentadol, buprenorphine, fentanyl, methadone. **Benzodiazepine medications include: diazepam (Valium), alprazolam (Xanax), clonazepam (Klonopine), lorazepam (Ativan), clorazepate (Tranxene), chlordiazepoxide (Librium), estazolam (Prosom), oxazepam (Serax), temazepam (Restoril), triazolam (Halcion) (Last updated: 12/20/2019) ____________________________________________________________________________________________   ____________________________________________________________________________________________  Medication Recommendations and Reminders  Applies to: All patients receiving prescriptions (written and/or electronic).  Medication Rules & Regulations: These rules and regulations exist for your safety and that of others. They are not flexible and neither are we. Dismissing or ignoring them will be considered "non-compliance" with medication therapy, resulting in complete and irreversible termination of such therapy. (See document titled "Medication Rules" for more details.) In all conscience, because of safety reasons, we cannot continue providing a therapy where the patient does not follow instructions.  Pharmacy of record:   Definition:  This is the pharmacy where your electronic prescriptions will be sent.   We do not endorse any particular pharmacy, however, we have experienced problems with [facility] not securing enough medication supply for the community.  We do not restrict you in your choice of pharmacy. However,   once we write for your prescriptions, we will NOT be re-sending more prescriptions to fix restricted supply problems created by your pharmacy, or your insurance.   The pharmacy listed in the electronic medical record should be the one where you want electronic prescriptions to be sent.  If you choose to change pharmacy, simply notify our nursing staff.  Recommendations:  Keep all of your pain medications in a safe place, under lock and key, even if you live alone. We will NOT replace lost, stolen, or damaged medication.  After you fill your prescription, take 1 week's worth of pills and put them away in a safe place. You should keep a separate, properly labeled bottle for this purpose. The remainder should be kept in the original bottle. Use this as your primary supply, until it runs out. Once it's gone, then you know that you have 1 week's worth of medicine, and it is time to come in for a prescription refill. If you do this correctly, it is unlikely that you will ever run out of medicine.  To make sure that the above recommendation works, it is very important that you make sure your medication refill appointments are scheduled at least 1 week before you run out of medicine. To do this in an effective manner, make sure that you do not leave the office without scheduling your next medication management appointment. Always ask the nursing staff to show you in your prescription , when your medication will be running out. Then arrange for the receptionist to get you a return appointment, at least 7 days before you run out of medicine. Do not wait until you have 1 or 2 pills left, to come in. This is very poor planning and  does not take into consideration that we may need to cancel appointments due to bad weather, sickness, or emergencies affecting our staff.  DO NOT ACCEPT A "Partial Fill": If for any reason your pharmacy does not have enough pills/tablets to completely fill or refill your prescription, do not allow for a "partial fill". The law allows the pharmacy to complete that prescription within 72 hours, without requiring a new prescription. If they do not fill the rest of your prescription within those 72 hours, you will need a separate prescription to fill the remaining amount, which we will NOT provide. If the reason for the partial fill is your insurance, you will need to talk to the pharmacist about payment alternatives for the remaining tablets, but again, DO NOT ACCEPT A PARTIAL FILL, unless you can trust your pharmacist to obtain the remainder of the pills within 72 hours.  Prescription refills and/or changes in medication(s):   Prescription refills, and/or changes in dose or medication, will be conducted only during scheduled medication management appointments. (Applies to both, written and electronic prescriptions.)  No refills on procedure days. No medication will be changed or started on procedure days. No changes, adjustments, and/or refills will be conducted on a procedure day. Doing so will interfere with the diagnostic portion of the procedure.  No phone refills. No medications will be "called into the pharmacy".  No Fax refills.  No weekend refills.  No Holliday refills.  No after hours refills.  Remember:  Business hours are:  Monday to Thursday 8:00 AM to 4:00 PM Provider's Schedule: Chaz Ronning, MD - Appointments are:  Medication management: Monday and Wednesday 8:00 AM to 4:00 PM Procedure day: Tuesday and Thursday 7:30 AM to 4:00 PM Bilal Lateef, MD - Appointments are:    Medication management: Tuesday and Thursday 8:00 AM to 4:00 PM Procedure day: Monday and Wednesday  7:30 AM to 4:00 PM (Last update: 08/11/2019) ____________________________________________________________________________________________   ____________________________________________________________________________________________  CBD (cannabidiol) WARNING  Applicable to: All individuals currently taking or considering taking CBD (cannabidiol) and, more important, all patients taking opioid analgesic controlled substances (pain medication). (Example: oxycodone; oxymorphone; hydrocodone; hydromorphone; morphine; methadone; tramadol; tapentadol; fentanyl; buprenorphine; butorphanol; dextromethorphan; meperidine; codeine; etc.)  Legal status: CBD remains a Schedule I drug prohibited for any use. CBD is illegal with one exception. In the [place], CBD has a limited Food and Drug Administration (FDA) approval for the treatment of two specific types of epilepsy disorders. Only one CBD product has been approved by the FDA for this purpose: "Epidiolex". FDA is aware that some companies are marketing products containing cannabis and cannabis-derived compounds in ways that violate the Federal Food, Drug and Cosmetic Act (FD&C Act) and that may put the health and safety of consumers at risk. The FDA, a Federal agency, has not enforced the CBD status since 2018.   Legality: Some manufacturers ship CBD products nationally, which is illegal. Often such products are sold online and are therefore available throughout the country. CBD is openly sold in head shops and health food stores in some states where such sales have not been explicitly legalized. Selling unapproved products with unsubstantiated therapeutic claims is not only a violation of the law, but also can put patients at risk, as these products have not been proven to be safe or effective. Federal illegality makes it difficult to conduct research on CBD.  Reference: "FDA Regulation of Cannabis and Cannabis-Derived Products, Including Cannabidiol  (CBD)" - https://www.fda.gov/news-events/public-health-focus/fda-regulation-cannabis-and-cannabis-derived-products-including-cannabidiol-cbd  Warning: CBD is not FDA approved and has not undergo the same manufacturing controls as prescription drugs.  This means that the purity and safety of available CBD may be questionable. Most of the time, despite manufacturer's claims, it is contaminated with THC (delta-9-tetrahydrocannabinol - the chemical in marijuana responsible for the "HIGH").  When this is the case, the THC contaminant will trigger a positive urine drug screen (UDS) test for Marijuana (carboxy-THC). Because a positive UDS for any illicit substance is a violation of our medication agreement, your opioid analgesics (pain medicine) may be permanently discontinued.  MORE ABOUT CBD  General Information: CBD  is a derivative of the Marijuana (cannabis sativa) plant discovered in 1940. It is one of the 113 identified substances found in Marijuana. It accounts for up to 40% of the plant's extract. As of 2018, preliminary clinical studies on CBD included research for the treatment of anxiety, movement disorders, and pain. CBD is available and consumed in multiple forms, including inhalation of smoke or vapor, as an aerosol spray, and by mouth. It may be supplied as an oil containing CBD, capsules, dried cannabis, or as a liquid solution. CBD is thought not to be as psychoactive as THC (delta-9-tetrahydrocannabinol - the chemical in marijuana responsible for the "HIGH"). Studies suggest that CBD may interact with different biological target receptors in the body, including cannabinoid and other neurotransmitter receptors. As of 2018 the mechanism of action for its biological effects has not been determined.  Side-effects  Adverse reactions: Dry mouth, diarrhea, decreased appetite, fatigue, drowsiness, malaise, weakness, sleep disturbances, and others.  Drug interactions: CBC may interact with other  medications such as blood-thinners. (Last update: 08/28/2019) ____________________________________________________________________________________________   ____________________________________________________________________________________________  Drug Holidays (Slow)  What is a "Drug Holiday"? Drug Holiday: is the name given to the period of time during   which a patient stops taking a medication(s) for the purpose of eliminating tolerance to the drug.  Benefits . Improved effectiveness of opioids. . Decreased opioid dose needed to achieve benefits. . Improved pain with lesser dose.  What is tolerance? Tolerance: is the progressive decreased in effectiveness of a drug due to its repetitive use. With repetitive use, the body gets use to the medication and as a consequence, it loses its effectiveness. This is a common problem seen with opioid pain medications. As a result, a larger dose of the drug is needed to achieve the same effect that used to be obtained with a smaller dose.  How long should a "Drug Holiday" last? You should stay off of the pain medicine for at least 14 consecutive days. (2 weeks)  Should I stop the medicine "cold turkey"? No. You should always coordinate with your Pain Specialist so that he/she can provide you with the correct medication dose to make the transition as smoothly as possible.  How do I stop the medicine? Slowly. You will be instructed to decrease the daily amount of pills that you take by one (1) pill every seven (7) days. This is called a "slow downward taper" of your dose. For example: if you normally take four (4) pills per day, you will be asked to drop this dose to three (3) pills per day for seven (7) days, then to two (2) pills per day for seven (7) days, then to one (1) per day for seven (7) days, and at the end of those last seven (7) days, this is when the "Drug Holiday" would start.   Will I have withdrawals? By doing a "slow downward  taper" like this one, it is unlikely that you will experience any significant withdrawal symptoms. Typically, what triggers withdrawals is the sudden stop of a high dose opioid therapy. Withdrawals can usually be avoided by slowly decreasing the dose over a prolonged period of time. If you do not follow these instructions and decide to stop your medication abruptly, withdrawals may be possible.  What are withdrawals? Withdrawals: refers to the wide range of symptoms that occur after stopping or dramatically reducing opiate drugs after heavy and prolonged use. Withdrawal symptoms do not occur to patients that use low dose opioids, or those who take the medication sporadically. Contrary to benzodiazepine (example: Valium, Xanax, etc.) or alcohol withdrawals ("Delirium Tremens"), opioid withdrawals are not lethal. Withdrawals are the physical manifestation of the body getting rid of the excess receptors.  Expected Symptoms Early symptoms of withdrawal may include: . Agitation . Anxiety . Muscle aches . Increased tearing . Insomnia . Runny nose . Sweating . Yawning  Late symptoms of withdrawal may include: . Abdominal cramping . Diarrhea . Dilated pupils . Goose bumps . Nausea . Vomiting  Will I experience withdrawals? Due to the slow nature of the taper, it is very unlikely that you will experience any.  What is a slow taper? Taper: refers to the gradual decrease in dose.  (Last update: 08/11/2019) ____________________________________________________________________________________________     

## 2020-07-01 LAB — TOXASSURE SELECT 13 (MW), URINE

## 2020-07-06 ENCOUNTER — Other Ambulatory Visit: Payer: Self-pay | Admitting: Pain Medicine

## 2020-07-06 DIAGNOSIS — G894 Chronic pain syndrome: Secondary | ICD-10-CM

## 2020-07-06 NOTE — Telephone Encounter (Signed)
Pt states that she is not able to get her refills onher meds

## 2020-07-09 ENCOUNTER — Other Ambulatory Visit: Payer: Self-pay

## 2020-07-09 ENCOUNTER — Ambulatory Visit
Admission: EM | Admit: 2020-07-09 | Discharge: 2020-07-09 | Disposition: A | Discharge status: Home / Self Care | Payer: Medicaid Other | Attending: Emergency Medicine | Admitting: Emergency Medicine

## 2020-07-09 ENCOUNTER — Encounter: Payer: Self-pay | Admitting: Emergency Medicine

## 2020-07-09 DIAGNOSIS — J40 Bronchitis, not specified as acute or chronic: Secondary | ICD-10-CM | POA: Diagnosis not present

## 2020-07-09 DIAGNOSIS — J069 Acute upper respiratory infection, unspecified: Secondary | ICD-10-CM

## 2020-07-09 MED ORDER — ALBUTEROL SULFATE HFA 108 (90 BASE) MCG/ACT IN AERS: 2.0000 | INHALATION_SPRAY | RESPIRATORY_TRACT | 0 refills | Status: DC | PRN

## 2020-07-09 MED ORDER — PROMETHAZINE-DM 6.25-15 MG/5ML PO SYRP: 5.0000 mL | ORAL_SOLUTION | Freq: Four times a day (QID) | ORAL | 0 refills | Status: DC | PRN

## 2020-07-09 MED ORDER — AEROCHAMBER MV MISC: 2 refills | Status: DC

## 2020-07-09 MED ORDER — DOXYCYCLINE HYCLATE 100 MG PO CAPS: 100.0000 mg | ORAL_CAPSULE | Freq: Two times a day (BID) | ORAL | 0 refills | Status: DC

## 2020-07-09 MED ORDER — BENZONATATE 100 MG PO CAPS: 200.0000 mg | ORAL_CAPSULE | Freq: Three times a day (TID) | ORAL | 0 refills | Status: DC

## 2020-07-09 NOTE — ED Provider Notes (Signed)
[facility]    CSN: [id number] Arrival date & time: 07/09/20  1513      History   Chief Complaint Chief Complaint  Patient presents with   Cough    HPI Tanya [name] is a 60 y.o. female.   HPI  59 year old female here for evaluation of respiratory complaints.  Patient reports that for the last 4 days she has been experiencing a runny nose with clear nasal discharge, headache, chest congestion with a productive cough for green sputum, sore throat, and shortness of breath.  She denies ear pain or wheezing.  She states that she has been around several friends who have had similar symptoms but no one tested positive for COVID.  Past Medical History:  Diagnosis Date   Anxiety    Bladder infection    8/18   Chronic lower back pain    Chronic neck pain    Collagen vascular disease ([place])    Depression    Diverticulitis    Hyperlipidemia    Lupus ([place])    Overactive bladder     Patient Active Problem List   Diagnosis Date Noted   Chronic use of opiate for therapeutic purpose 06/25/2020   Arthropathy of cervical facet joint (Bilateral) 04/13/2020   Cervical spine pain 04/13/2020   Abnormal MRI, cervical spine (09/20/2015) 04/13/2020   Cervical central spinal stenosis (Multilevel) 04/13/2020   Cervical foraminal stenosis (Right: C3-4) (Bilateral: C4-5, C5-6) 04/13/2020   DDD (degenerative disc disease), cervical 03/14/2020   Chronic shoulder pain (Left) 09/06/2019   Osteoarthritis of shoulder (Left) 09/06/2019   Osteoarthritis of shoulders (Bilateral) (R>L) 10/25/2018   Osteoarthritis of AC (acromioclavicular) joint (Right) 04/02/2018   Chronic acromioclavicular joint pain (Right) 04/02/2018   Chronic shoulder pain (Right) 04/02/2018   Epicondylitis elbow, medial (Left) 04/02/2018   Chronic elbow pain (Left) 03/25/2018   Occipital headache (Left) 12/10/2017   Neurogenic pain 12/10/2017   Cervico-occipital neuralgia (Left) 12/10/2017   Pharmacologic  therapy 09/01/2017   Disorder of skeletal system 09/01/2017   Problems influencing health status 09/01/2017   Spondylosis without myelopathy or radiculopathy, cervical region 08/05/2017   Cervicalgia 08/05/2017   Rash 05/13/2017   Acute postoperative pain 06/26/2016   Cervical spondylosis 04/15/2016   Cervical facet syndrome (Bilateral) (R>L) 04/15/2016   Shoulder radicular pain (Bilateral) (R>L) 04/15/2016   Chronic musculoskeletal pain 03/07/2016   Vitamin D insufficiency 12/19/2015   Anxiety 11/23/2015   Chronic low back pain (3ry area of Pain) (Bilateral) (R>L) 11/23/2015   Chronic neck pain (1ry area of Pain) (Bilateral) (R>L) 11/23/2015   Long term prescription benzodiazepine use 11/23/2015   Long term current use of opiate analgesic 11/23/2015   Long term prescription opiate use 11/23/2015   Opiate use (10 MME/Day) 11/23/2015   Chronic pain syndrome 11/23/2015   Chronic upper extremity pain (Right) 11/23/2015   Chronic cervical radicular pain (Right) 11/23/2015   Chronic shoulder pain (Bilateral) (R>L) 11/23/2015   Hypercholesterolemia 06/20/2015   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   Encounter for long-term (current) use of high-risk medication 06/06/2015   Pain medication agreement signed 11/24/2014   Hyperlipidemia 01/25/2014   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   Diverticulitis 10/04/2012   Osteoarthritis 10/04/2012   Chronic upper back pain (2ry area of Pain) (Bilateral) (R>L) 08/17/2012   Urinary incontinence 08/17/2012   Fibromyalgia 06/04/2012   SLE (systemic lupus erythematosus) ([place]) 02/18/2012   Generalized anxiety disorder 01/07/2012   Major depressive disorder, recurrent ([place]) 09/29/2009    Past Surgical History:  Procedure  Laterality Date   ABDOMINAL HYSTERECTOMY     COLONOSCOPY     COLONOSCOPY WITH PROPOFOL N/A 07/03/2017   Procedure: COLONOSCOPY WITH PROPOFOL;  Surgeon: [name], MD;  Location: [facility] ENDOSCOPY;  Service: Endoscopy;   Laterality: N/A;   COLONOSCOPY WITH PROPOFOL N/A 10/21/2017   Procedure: COLONOSCOPY WITH PROPOFOL;  Surgeon: [name], MD;  Location: [facility] ENDOSCOPY;  Service: Endoscopy;  Laterality: N/A;   ESOPHAGOGASTRODUODENOSCOPY N/A 10/21/2017   Procedure: ESOPHAGOGASTRODUODENOSCOPY (EGD);  Surgeon: [name], MD;  Location: [facility] ENDOSCOPY;  Service: Endoscopy;  Laterality: N/A;   TONSILLECTOMY      OB History   No obstetric history on file.      Home Medications    Prior to Admission medications   Medication Sig Start Date End Date Taking? Authorizing Provider  albuterol (VENTOLIN HFA) 108 (90 Base) MCG/ACT inhaler Inhale 2 puffs into the lungs every 4 (four) hours as needed. 07/09/20  Yes Margarette Canada, NP  benzonatate (TESSALON) 100 MG capsule Take 2 capsules (200 mg total) by mouth every 8 (eight) hours. 07/09/20  Yes Margarette Canada, NP  doxycycline (VIBRAMYCIN) 100 MG capsule Take 1 capsule (100 mg total) by mouth 2 (two) times daily. 07/09/20  Yes Margarette Canada, NP  promethazine-dextromethorphan (PROMETHAZINE-DM) 6.25-15 MG/5ML syrup Take 5 mLs by mouth 4 (four) times daily as needed. 07/09/20  Yes Margarette Canada, NP  Spacer/Aero-Holding Chambers (AEROCHAMBER MV) inhaler Use as instructed 07/09/20  Yes Margarette Canada, NP  atorvastatin (LIPITOR) 80 MG tablet Take 80 mg by mouth daily. 04/19/20   [provider]  baclofen (LIORESAL) 10 MG tablet Take 1-2 tablets (10-20 mg total) by mouth at bedtime. 06/26/20 09/24/20  Milinda Pointer, MD  busPIRone (BUSPAR) 15 MG tablet Take 30 mg by mouth 2 (two) times daily as needed. 04/19/20   [provider]  DULoxetine (CYMBALTA) 30 MG capsule Take 30 mg by mouth daily.    [provider]  gabapentin (NEURONTIN) 300 MG capsule Take 1 capsule (300 mg total) by mouth 3 (three) times daily. 06/26/20 09/24/20  Milinda Pointer, MD  oxyCODONE (OXY IR/ROXICODONE) 5 MG immediate release tablet Take 1 tablet (5 mg total) by mouth 2 (two)  times daily. Must last 30 days 06/04/20 07/04/20  Milinda Pointer, MD  oxyCODONE (OXY IR/ROXICODONE) 5 MG immediate release tablet Take 1 tablet (5 mg total) by mouth 2 (two) times daily. Must last 30 days 08/03/20 09/02/20  Milinda Pointer, MD  oxyCODONE (OXY IR/ROXICODONE) 5 MG immediate release tablet Take 1 tablet (5 mg total) by mouth 2 (two) times daily. Must last 30 days 09/02/20 10/02/20  Milinda Pointer, MD  oxyCODONE (OXY IR/ROXICODONE) 5 MG immediate release tablet Take 1 tablet (5 mg total) by mouth 2 (two) times daily. Must last 30 days 10/02/20 11/01/20  Milinda Pointer, MD    Family History Family History  Problem Relation Age of Onset   Cancer Mother    Hypertension Mother    Emphysema Mother    Stroke Mother    Glaucoma Father    Heart disease Father    Diabetes Brother     Social History Social History   Tobacco Use   Smoking status: Every Day    Packs/day: 0.50    Years: 35.00    Pack years: 17.50    Types: Cigarettes   Smokeless tobacco: Never  Vaping Use   Vaping Use: Never used  Substance Use Topics   Alcohol use: No   Drug use: No  Allergies   Pregabalin, Trazodone, Bupropion, Methocarbamol, and Tramadol   Review of Systems Review of Systems  Constitutional:  Positive for fever. Negative for activity change and appetite change.  HENT:  Positive for congestion, rhinorrhea and sore throat. Negative for ear pain.   Respiratory:  Positive for choking and shortness of breath. Negative for wheezing.   Gastrointestinal:  Negative for diarrhea, nausea and vomiting.  Musculoskeletal:  Negative for arthralgias and myalgias.  Neurological:  Positive for headaches.  Hematological: Negative.   Psychiatric/Behavioral: Negative.      Physical Exam Triage Vital Signs ED Triage Vitals  Enc Vitals Group     BP 07/09/20 1548 (!) 140/92     Pulse Rate 07/09/20 1548 82     Resp 07/09/20 1548 16     Temp 07/09/20 1548 97.7 F (36.5 C)     Temp  Source 07/09/20 1548 Oral     SpO2 07/09/20 1548 99 %     Weight 07/09/20 1546 150 lb (68 kg)     Height 07/09/20 1546 5\' 3"  (1.6 m)     Head Circumference --      Peak Flow --      Pain Score 07/09/20 1545 6     Pain Loc --      Pain Edu? --      Excl. in [place]? --    No data found.  Updated Vital Signs BP (!) 140/92 (BP Location: Left Arm)   Pulse 82   Temp 97.7 F (36.5 C) (Oral)   Resp 16   Ht 5\' 3"  (1.6 m)   Wt 150 lb (68 kg)   SpO2 99%   BMI 26.57 kg/m   Visual Acuity Right Eye Distance:   Left Eye Distance:   Bilateral Distance:    Right Eye Near:   Left Eye Near:    Bilateral Near:     Physical Exam Vitals and nursing note reviewed.  Constitutional:      General: She is not in acute distress.    Appearance: Normal appearance. She is normal weight. She is ill-appearing.  HENT:     Head: Normocephalic and atraumatic.     Right Ear: Tympanic membrane, ear canal and external ear normal. There is no impacted cerumen.     Left Ear: Tympanic membrane, ear canal and external ear normal. There is no impacted cerumen.     Nose: Congestion present. No rhinorrhea.     Mouth/Throat:     Mouth: Mucous membranes are moist.     Pharynx: Oropharynx is clear. Posterior oropharyngeal erythema present.  Cardiovascular:     Rate and Rhythm: Normal rate and regular rhythm.     Pulses: Normal pulses.     Heart sounds: Normal heart sounds. No murmur heard.   No gallop.  Pulmonary:     Effort: Pulmonary effort is normal.     Breath sounds: Wheezing and rhonchi present. No rales.  Musculoskeletal:     Cervical back: Normal range of motion and neck supple.  Lymphadenopathy:     Cervical: No cervical adenopathy.  Skin:    General: Skin is warm and dry.     Capillary Refill: Capillary refill takes less than 2 seconds.     Findings: No erythema or rash.  Neurological:     General: No focal deficit present.     Mental Status: She is alert and oriented to person, place, and  time.  Psychiatric:        Mood and Affect:  Mood normal.        Behavior: Behavior normal.        Thought Content: Thought content normal.        Judgment: Judgment normal.     UC Treatments / Results  Labs (all labs ordered are listed, but only abnormal results are displayed) Labs Reviewed - No data to display  EKG   Radiology No results found.  Procedures Procedures (including critical care time)  Medications Ordered in UC Medications - No data to display  Initial Impression / Assessment and Plan / UC Course  I have reviewed the triage vital signs and the nursing notes.  Pertinent labs & imaging results that were available during my care of the patient were reviewed by me and considered in my medical decision making (see chart for details).  Patient is a very pleasant though ill-appearing 60 year old female here for evaluation of respiratory symptoms as outlined in the HPI above.  Patient's physical exam reveals pearly gray tympanic membranes bilaterally with a normal light reflex and clear external auditory canals.  Nasal mucosa is erythematous and edematous without nasal discharge in both nares.  Oropharyngeal exam reveals posterior oropharyngeal erythema with clear postnasal drip.  No cervical lymphadenopathy appreciated exam.  Cardiopulmonary exam reveals wheezes and rhonchi in bilateral upper and middle lobes.  Patient's exam is consistent with bronchitis.  She is a smoker so we will cover with doxycycline for potential infectious source, Tessalon Perles and Promethazine DM cough syrup for cough and congestion, and albuterol inhaler with a spacer to help with wheezing and shortness of breath.  Return to ER precautions reviewed with patient.   Final Clinical Impressions(s) / UC Diagnoses   Final diagnoses:  Bronchitis  Upper respiratory tract infection, unspecified type     Discharge Instructions      Take the Doxycycline twice daily for 10 days for treatment of  your bronchitis.  Use the Tessalon perles during the day for cough and the Promethazine DM at bedtime.  Use the Albuterol inhaler with the spacer, 2 puffs every 4-6 hours.   Return for new or worsening symptoms.  If you have any worsening shortness of breath and feel you cannot catch her breath please go to the ER for evaluation.     ED Prescriptions     Medication Sig Dispense Auth. Provider   doxycycline (VIBRAMYCIN) 100 MG capsule Take 1 capsule (100 mg total) by mouth 2 (two) times daily. 20 capsule Margarette Canada, NP   benzonatate (TESSALON) 100 MG capsule Take 2 capsules (200 mg total) by mouth every 8 (eight) hours. 21 capsule Margarette Canada, NP   promethazine-dextromethorphan (PROMETHAZINE-DM) 6.25-15 MG/5ML syrup Take 5 mLs by mouth 4 (four) times daily as needed. 118 mL Margarette Canada, NP   albuterol (VENTOLIN HFA) 108 (90 Base) MCG/ACT inhaler Inhale 2 puffs into the lungs every 4 (four) hours as needed. 18 g Margarette Canada, NP   Spacer/Aero-Holding Chambers (AEROCHAMBER MV) inhaler Use as instructed 1 each Margarette Canada, NP      PDMP not reviewed this encounter.   Margarette Canada, NP 07/09/20 1641

## 2020-07-09 NOTE — Discharge Instructions (Addendum)
Take the Doxycycline twice daily for 10 days for treatment of your bronchitis.  Use the Tessalon perles during the day for cough and the Promethazine DM at bedtime.  Use the Albuterol inhaler with the spacer, 2 puffs every 4-6 hours.   Return for new or worsening symptoms.  If you have any worsening shortness of breath and feel you cannot catch her breath please go to the ER for evaluation.

## 2020-07-09 NOTE — ED Triage Notes (Signed)
Patient c/o cough, chest congestion, runny nose, and headache that started 4 days ago.  Patient denies fevers.

## 2020-07-12 ENCOUNTER — Telehealth: Payer: Self-pay | Admitting: Pain Medicine

## 2020-07-12 ENCOUNTER — Other Ambulatory Visit: Payer: Self-pay | Admitting: Pain Medicine

## 2020-07-12 DIAGNOSIS — G894 Chronic pain syndrome: Secondary | ICD-10-CM

## 2020-07-12 MED ORDER — OXYCODONE HCL 5 MG PO TABS: 5.0000 mg | ORAL_TABLET | Freq: Two times a day (BID) | ORAL | 0 refills | Status: DC

## 2020-07-12 NOTE — Telephone Encounter (Signed)
Patient came in for her appt 06-26-20. Scripts were put in for July, Aug, Sept.    Forgot to put in June. Patient is out of meds please ask Dr. Dossie Arbour to send a script for June so she can get her medications.

## 2020-07-12 NOTE — Telephone Encounter (Signed)
BUBBLE SENT TO dR nAVEIRA

## 2020-09-02 ENCOUNTER — Other Ambulatory Visit: Payer: Self-pay | Admitting: Pain Medicine

## 2020-09-02 DIAGNOSIS — G8929 Other chronic pain: Secondary | ICD-10-CM

## 2020-10-02 ENCOUNTER — Telehealth: Payer: Self-pay | Admitting: Pain Medicine

## 2020-10-02 NOTE — Telephone Encounter (Signed)
PA submitted today.  Patient notified of this.

## 2020-10-03 ENCOUNTER — Ambulatory Visit
Admission: EM | Admit: 2020-10-03 | Discharge: 2020-10-03 | Disposition: A | Discharge status: Home / Self Care | Payer: Medicaid Other | Attending: Physician Assistant | Admitting: Physician Assistant

## 2020-10-03 ENCOUNTER — Other Ambulatory Visit: Payer: Self-pay

## 2020-10-03 DIAGNOSIS — L0291 Cutaneous abscess, unspecified: Secondary | ICD-10-CM | POA: Diagnosis not present

## 2020-10-03 MED ORDER — DOXYCYCLINE HYCLATE 100 MG PO CAPS: 100.0000 mg | ORAL_CAPSULE | Freq: Two times a day (BID) | ORAL | 0 refills | Status: AC

## 2020-10-03 NOTE — Discharge Instructions (Addendum)
-  I have drained an abscess today. Keep area clean and dry. Change bandage daily -Start doxycycline today and take full course -If you have fever, increased pain, swelling, redness, you should  return for another evaluation.

## 2020-10-03 NOTE — ED Provider Notes (Signed)
[facility]    CSN: [id number] Arrival date & time: 10/03/20  1230      History   Chief Complaint Chief Complaint  Patient presents with   Abscess    HPI Tanya [name] is a 60 y.o. female presenting for approximately 1 week history of area of redness and swelling of the right lateral chest.  Area is tender.  She denies any drainage.  She is unsure if she was bit or stung by an insect.  Area has continued to enlarge.  She denies any fevers.  No history of recurrent skin infections or MRSA.  HPI  Past Medical History:  Diagnosis Date   Anxiety    Bladder infection    8/18   Chronic lower back pain    Chronic neck pain    Collagen vascular disease ([place])    Depression    Diverticulitis    Hyperlipidemia    Lupus ([place])    Overactive bladder     Patient Active Problem List   Diagnosis Date Noted   Chronic use of opiate for therapeutic purpose 06/25/2020   Arthropathy of cervical facet joint (Bilateral) 04/13/2020   Cervical spine pain 04/13/2020   Abnormal MRI, cervical spine (09/20/2015) 04/13/2020   Cervical central spinal stenosis (Multilevel) 04/13/2020   Cervical foraminal stenosis (Right: C3-4) (Bilateral: C4-5, C5-6) 04/13/2020   DDD (degenerative disc disease), cervical 03/14/2020   Chronic shoulder pain (Left) 09/06/2019   Osteoarthritis of shoulder (Left) 09/06/2019   Osteoarthritis of shoulders (Bilateral) (R>L) 10/25/2018   Osteoarthritis of AC (acromioclavicular) joint (Right) 04/02/2018   Chronic acromioclavicular joint pain (Right) 04/02/2018   Chronic shoulder pain (Right) 04/02/2018   Epicondylitis elbow, medial (Left) 04/02/2018   Chronic elbow pain (Left) 03/25/2018   Occipital headache (Left) 12/10/2017   Neurogenic pain 12/10/2017   Cervico-occipital neuralgia (Left) 12/10/2017   Pharmacologic therapy 09/01/2017   Disorder of skeletal system 09/01/2017   Problems influencing health status 09/01/2017   Spondylosis without  myelopathy or radiculopathy, cervical region 08/05/2017   Cervicalgia 08/05/2017   Rash 05/13/2017   Acute postoperative pain 06/26/2016   Cervical spondylosis 04/15/2016   Cervical facet syndrome (Bilateral) (R>L) 04/15/2016   Shoulder radicular pain (Bilateral) (R>L) 04/15/2016   Chronic musculoskeletal pain 03/07/2016   Vitamin D insufficiency 12/19/2015   Anxiety 11/23/2015   Chronic low back pain (3ry area of Pain) (Bilateral) (R>L) 11/23/2015   Chronic neck pain (1ry area of Pain) (Bilateral) (R>L) 11/23/2015   Long term prescription benzodiazepine use 11/23/2015   Long term current use of opiate analgesic 11/23/2015   Long term prescription opiate use 11/23/2015   Opiate use (10 MME/Day) 11/23/2015   Chronic pain syndrome 11/23/2015   Chronic upper extremity pain (Right) 11/23/2015   Chronic cervical radicular pain (Right) 11/23/2015   Chronic shoulder pain (Bilateral) (R>L) 11/23/2015   Hypercholesterolemia 06/20/2015   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   Encounter for long-term (current) use of high-risk medication 06/06/2015   Pain medication agreement signed 11/24/2014   Hyperlipidemia 01/25/2014   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   Diverticulitis 10/04/2012   Osteoarthritis 10/04/2012   Chronic upper back pain (2ry area of Pain) (Bilateral) (R>L) 08/17/2012   Urinary incontinence 08/17/2012   Fibromyalgia 06/04/2012   SLE (systemic lupus erythematosus) ([place]) 02/18/2012   Generalized anxiety disorder 01/07/2012   Major depressive disorder, recurrent ([place]) 09/29/2009    Past Surgical History:  Procedure Laterality Date   ABDOMINAL HYSTERECTOMY     COLONOSCOPY     COLONOSCOPY  WITH PROPOFOL N/A 07/03/2017   Procedure: COLONOSCOPY WITH PROPOFOL;  Surgeon: [name], MD;  Location: [facility] ENDOSCOPY;  Service: Endoscopy;  Laterality: N/A;   COLONOSCOPY WITH PROPOFOL N/A 10/21/2017   Procedure: COLONOSCOPY WITH PROPOFOL;  Surgeon: [name], MD;   Location: [facility] ENDOSCOPY;  Service: Endoscopy;  Laterality: N/A;   ESOPHAGOGASTRODUODENOSCOPY N/A 10/21/2017   Procedure: ESOPHAGOGASTRODUODENOSCOPY (EGD);  Surgeon: [name], MD;  Location: [facility] ENDOSCOPY;  Service: Endoscopy;  Laterality: N/A;   TONSILLECTOMY      OB History   No obstetric history on file.      Home Medications    Prior to Admission medications   Medication Sig Start Date End Date Taking? Authorizing Provider  albuterol (VENTOLIN HFA) 108 (90 Base) MCG/ACT inhaler Inhale 2 puffs into the lungs every 4 (four) hours as needed. 07/09/20  Yes Margarette Canada, NP  atorvastatin (LIPITOR) 80 MG tablet Take 80 mg by mouth daily. 04/19/20  Yes [provider]  busPIRone (BUSPAR) 15 MG tablet Take 30 mg by mouth 2 (two) times daily as needed. 04/19/20  Yes [provider]  doxycycline (VIBRAMYCIN) 100 MG capsule Take 1 capsule (100 mg total) by mouth 2 (two) times daily for 7 days. 10/03/20 10/10/20 Yes Danton Clap, PA-C  DULoxetine (CYMBALTA) 30 MG capsule Take 30 mg by mouth daily.   Yes [provider]  gabapentin (NEURONTIN) 300 MG capsule Take 1 capsule (300 mg total) by mouth 3 (three) times daily. 06/26/20 10/03/20 Yes Milinda Pointer, MD  oxyCODONE (OXY IR/ROXICODONE) 5 MG immediate release tablet Take 1 tablet (5 mg total) by mouth 2 (two) times daily. Must last 30 days 10/02/20 11/01/20 Yes Milinda Pointer, MD  Spacer/Aero-Holding Chambers (AEROCHAMBER MV) inhaler Use as instructed 07/09/20  Yes Margarette Canada, NP  baclofen (LIORESAL) 10 MG tablet Take 1-2 tablets (10-20 mg total) by mouth at bedtime. 06/26/20 09/24/20  Milinda Pointer, MD  oxyCODONE (OXY IR/ROXICODONE) 5 MG immediate release tablet Take 1 tablet (5 mg total) by mouth 2 (two) times daily. Must last 30 days 08/03/20 09/02/20  Milinda Pointer, MD  oxyCODONE (OXY IR/ROXICODONE) 5 MG immediate release tablet Take 1 tablet (5 mg total) by mouth 2 (two) times daily. Must last 30  days 09/02/20 10/02/20  Milinda Pointer, MD  oxyCODONE (OXY IR/ROXICODONE) 5 MG immediate release tablet Take 1 tablet (5 mg total) by mouth 2 (two) times daily for 22 days. Until 08/03/20 refill 07/12/20 08/03/20  Milinda Pointer, MD    Family History Family History  Problem Relation Age of Onset   Cancer Mother    Hypertension Mother    Emphysema Mother    Stroke Mother    Glaucoma Father    Heart disease Father    Diabetes Brother     Social History Social History   Tobacco Use   Smoking status: Every Day    Packs/day: 0.50    Years: 35.00    Pack years: 17.50    Types: Cigarettes   Smokeless tobacco: Never  Vaping Use   Vaping Use: Never used  Substance Use Topics   Alcohol use: No   Drug use: No     Allergies   Pregabalin, Trazodone, Bupropion, Methocarbamol, and Tramadol   Review of Systems Review of Systems  Constitutional:  Negative for fever.  Musculoskeletal:  Negative for arthralgias and myalgias.  Skin:  Positive for color change and rash.    Physical Exam Triage Vital Signs ED Triage Vitals  Enc Vitals Group  BP 10/03/20 1328 (!) 144/75     Pulse Rate 10/03/20 1328 75     Resp 10/03/20 1328 18     Temp 10/03/20 1328 98.2 F (36.8 C)     Temp Source 10/03/20 1328 Oral     SpO2 10/03/20 1328 98 %     Weight 10/03/20 1324 149 lb (67.6 kg)     Height 10/03/20 1324 '5\' 3"'$  (1.6 m)     Head Circumference --      Peak Flow --      Pain Score 10/03/20 1323 0     Pain Loc --      Pain Edu? --      Excl. in [place]? --    No data found.  Updated Vital Signs BP (!) 144/75 (BP Location: Left Arm)   Pulse 75   Temp 98.2 F (36.8 C) (Oral)   Resp 18   Ht '5\' 3"'$  (1.6 m)   Wt 149 lb (67.6 kg)   SpO2 98%   BMI 26.39 kg/m      Physical Exam Vitals and nursing note reviewed.  Constitutional:      General: She is not in acute distress.    Appearance: Normal appearance. She is not ill-appearing or toxic-appearing.  HENT:     Head:  Normocephalic and atraumatic.  Eyes:     General: No scleral icterus.       Right eye: No discharge.        Left eye: No discharge.     Conjunctiva/sclera: Conjunctivae normal.  Cardiovascular:     Rate and Rhythm: Normal rate and regular rhythm.  Pulmonary:     Effort: Pulmonary effort is normal. No respiratory distress.  Musculoskeletal:     Cervical back: Neck supple.  Skin:    General: Skin is dry.     Findings: Lesion (2 cm x 2 cm area of erythema, induration, and fluctuance of right lateral chest) present.  Neurological:     General: No focal deficit present.     Mental Status: She is alert. Mental status is at baseline.     Motor: No weakness.     Gait: Gait normal.  Psychiatric:        Mood and Affect: Mood normal.        Behavior: Behavior normal.        Thought Content: Thought content normal.     UC Treatments / Results  Labs (all labs ordered are listed, but only abnormal results are displayed) Labs Reviewed - No data to display  EKG   Radiology No results found.  Procedures Incision and Drainage  Date/Time: 10/03/2020 2:17 PM Performed by: Danton Clap, PA-C Authorized by: Danton Clap, PA-C   Consent:    Consent obtained:  Verbal   Consent given by:  Patient   Risks discussed:  Bleeding, infection, incomplete drainage and pain   Alternatives discussed:  Alternative treatment, delayed treatment and observation Universal protocol:    Patient identity confirmed:  Verbally with patient Location:    Type:  Abscess   Size:  2 cm x 2 cm   Location:  Trunk   Trunk location:  Chest Pre-procedure details:    Skin preparation:  Povidone-iodine Anesthesia:    Anesthesia method:  Local infiltration   Local anesthetic:  Lidocaine 1% w/o epi and lidocaine 1% WITH epi Procedure type:    Complexity:  Simple Procedure details:    Incision types:  Single straight   Drainage:  Purulent  Drainage amount:  Moderate   Wound treatment:  Wound left  open Post-procedure details:    Procedure completion:  Tolerated well, no immediate complications (including critical care time)  Medications Ordered in UC Medications - No data to display  Initial Impression / Assessment and Plan / UC Course  I have reviewed the triage vital signs and the nursing notes.  Pertinent labs & imaging results that were available during my care of the patient were reviewed by me and considered in my medical decision making (see chart for details).   60 y/o female presenting for 1 week history of swelling, redness and tenderness.   Clinical presentation consistent with abscess.  I&D performed today and pustular material yielded.  Area covered with gauze and paper tape.  Sent in doxycycline and reviewed wound care and close monitoring.  ED precautions reviewed.  Final Clinical Impressions(s) / UC Diagnoses   Final diagnoses:  Abscess     Discharge Instructions      -I have drained an abscess today. Keep area clean and dry. Change bandage daily -Start doxycycline today and take full course -If you have fever, increased pain, swelling, redness, you should  return for another evaluation.     ED Prescriptions     Medication Sig Dispense Auth. Provider   doxycycline (VIBRAMYCIN) 100 MG capsule Take 1 capsule (100 mg total) by mouth 2 (two) times daily for 7 days. 14 capsule Danton Clap, PA-C      PDMP not reviewed this encounter.   Danton Clap, PA-C 10/03/20 1420

## 2020-10-03 NOTE — ED Triage Notes (Signed)
Patient came in today C/O knot says its hard and long and  located on upper right side. Its been there for about a week. Reports its sore. -MB

## 2020-10-06 ENCOUNTER — Telehealth: Payer: Self-pay | Admitting: Pain Medicine

## 2020-10-06 NOTE — Telephone Encounter (Signed)
Patient lvmail at 4:49 asking stating her medication needs PA. Please advise patient when she can pick up meds.

## 2020-10-06 NOTE — Telephone Encounter (Signed)
PA was resent today with notes.

## 2020-10-09 ENCOUNTER — Telehealth: Payer: Self-pay | Admitting: Pain Medicine

## 2020-10-09 NOTE — Telephone Encounter (Signed)
Patient received a letter from her insurance telling her they have no evidence she needs her pain meds. Please check to see if patient needs PA with records sent. Please call patient to advise.

## 2020-10-09 NOTE — Telephone Encounter (Signed)
Returned patient phone call to let her know that PA has been sent.  States she already has this straightened out with her insurance company.

## 2020-10-30 NOTE — Progress Notes (Signed)
PROVIDER NOTE: Information contained herein reflects review and annotations entered in association with encounter. Interpretation of such information and data should be left to medically-trained personnel. Information provided to patient can be located elsewhere in the medical record under "Patient Instructions". Document created using STT-dictation technology, any transcriptional errors that may result from process are unintentional.    Patient: Tanya [name]  Service Category: E/M  Provider: Gaspar Cola, MD  DOB: 02/21/1960  DOS: 11/01/2020  Specialty: Interventional Pain Management  MRN: [id number]  Setting: Ambulatory outpatient  PCP: Valera Castle, MD  Type: Established Patient    Referring Provider: Lisbeth Ply Ku*  Location: Office  Delivery: Face-to-face     HPI  Ms. SHERRYL VALIDO, a 60 y.o. year old female, is here today because of her Chronic neck pain [M54.2, G89.29]. Ms. Mohammed primary complain today is Back Pain (lower) Last encounter: My last encounter with her was on 10/09/2020. Pertinent problems: Ms. Rivere has Chronic upper back pain (2ry area of Pain) (Bilateral) (R>L); Chronic low back pain (3ry area of Pain) (Bilateral) (R>L) w/o sciatica; Chronic neck pain (1ry area of Pain) (Bilateral) (R>L); Fibromyalgia; Osteoarthritis; Chronic pain syndrome; Chronic upper extremity pain (Right); Chronic cervical radicular pain (Right); Chronic shoulder pain (Bilateral) (R>L); Chronic musculoskeletal pain; Cervical spondylosis; Cervical facet syndrome (Bilateral) (R>L); Shoulder radicular pain (Bilateral) (R>L); Acute postoperative pain; Spondylosis without myelopathy or radiculopathy, cervical region; Cervicalgia; Occipital headache (Left); Neurogenic pain; Cervico-occipital neuralgia (Left); Chronic elbow pain (Left); Osteoarthritis of AC (acromioclavicular) joint (Right); Chronic acromioclavicular joint pain (Right); Chronic shoulder pain (Right);  Epicondylitis elbow, medial (Left); 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 Pain Assessment: Severity of Chronic pain is reported as a 0-No pain/10. Location: Back Right, Left/Denies. Onset: More than a month ago. Quality: Aching. Timing: Intermittent. Modifying factor(s): Meds. Vitals:  height is _0  (1.6 m) and weight is 150 lb (68 kg). Her temperature is 96.6 F (35.9 C) (abnormal). Her blood pressure is 132/85 and her pulse is 88. Her oxygen saturation is 100%.   Reason for encounter: medication management.   The patient indicates doing well with the current medication regimen. No adverse reactions or side effects reported to the medications.  Today the patient has requested a referral to psychiatry.  RTCB: 02/06/2021 Nonopioids transfer 12/22/2019: Baclofen and Neurontin  Pharmacotherapy Assessment  Analgesic: Oxycodone IR 5 mg, 1 tab PO BID (10 mg/day of oxycodone) MME/day: 15 mg/day.   Monitoring: [place] PMP: PDMP reviewed during this encounter.       Pharmacotherapy: No side-effects or adverse reactions reported. Compliance: No problems identified. Effectiveness: Clinically acceptable.  Chauncey Fischer, RN  11/01/2020 12:14 PM  Sign when Signing Visit Nursing Pain Medication Assessment:  Safety precautions to be maintained throughout the outpatient stay will include: orient to surroundings, keep bed in low position, maintain call bell within reach at all times, provide assistance with transfer out of bed and ambulation.  Medication Inspection Compliance: Pill count conducted under aseptic conditions, in front of the  patient. Neither the pills nor the bottle was removed from the patient's sight at any time. Once count was completed pills were immediately returned to the patient in their original bottle.  Medication: Oxycodone IR Pill/Patch Count:  0 of 60 pills remain Pill/Patch Appearance: Markings consistent with prescribed medication Bottle Appearance: Standard pharmacy container. Clearly labeled. Filled Date: 46 / 19 / 2022 Last Medication intake:  Today Safety precautions to be maintained throughout the outpatient stay will include: orient to surroundings, keep bed in low position, maintain call bell within reach at all times, provide assistance with transfer out of bed and ambulation.      UDS:  Summary  Date Value Ref Range Status  06/26/2020 Note  Final    Comment:    ==================================================================== [employer] Select 13 (MW) ==================================================================== Test                             Result       Flag       Units  Drug Absent but Declared for Prescription Verification   Oxycodone                      Not Detected UNEXPECTED ng/mg creat ==================================================================== Test                      Result    Flag   Units      Ref Range   Creatinine              21               mg/dL      >=20 ==================================================================== Declared Medications:  The flagging and interpretation on this report are based on the  following declared medications.  Unexpected results may arise from  inaccuracies in the declared medications.   **Note: The testing scope of this panel includes these medications:   Oxycodone   **Note: The testing scope of this panel does not include the  following reported medications:   Atorvastatin (Lipitor)  Baclofen (Lioresal)  Buspirone (Buspar)  Duloxetine (Cymbalta)  Gabapentin  (Neurontin) ==================================================================== For clinical consultation, please call [phone]. ====================================================================      ROS  Constitutional: Denies any fever or chills Gastrointestinal: No reported hemesis, hematochezia, vomiting, or acute GI distress Musculoskeletal: Denies any acute onset joint swelling, redness, loss of ROM, or weakness Neurological: No reported episodes of acute onset apraxia, aphasia, dysarthria, agnosia, amnesia, paralysis, loss of coordination, or loss of consciousness  Medication Review  AeroChamber MV, DULoxetine, atorvastatin, baclofen, busPIRone, gabapentin, and oxyCODONE  History Review  Allergy: Ms. Myhre is allergic to pregabalin, trazodone, bupropion, methocarbamol, and tramadol. Drug: Ms. Fiallo  reports no history of drug use. Alcohol:  reports no history of alcohol use. Tobacco:  reports that she has been smoking cigarettes. She has a 17.50 pack-year smoking history. She has never used smokeless tobacco. Social: Ms. Bamberg  reports that she has been smoking cigarettes. She has a 17.50 pack-year smoking history. She has never used smokeless tobacco. She reports that she does not drink alcohol and does not use drugs. Medical:  has a past medical history of Anxiety, Bladder infection, Chronic lower back pain, Chronic neck pain, Collagen vascular disease ([place]), Depression, Diverticulitis, Hyperlipidemia, Lupus ([place]), and Overactive bladder. Surgical: Ms. Chriswell  has a past surgical history that includes Abdominal hysterectomy; Colonoscopy; Colonoscopy with propofol (N/A, 07/03/2017); Tonsillectomy; Esophagogastroduodenoscopy (N/A, 10/21/2017); and Colonoscopy with propofol (N/A, 10/21/2017). Family: family history includes Cancer in her mother; Diabetes in her brother; Emphysema in her mother; Glaucoma in her father; Heart disease in her father; Hypertension  in her mother; Stroke in her mother.  Laboratory Chemistry Profile   Renal Lab Results  Component Value Date   BUN 7 06/09/2019   CREATININE 0.72 06/09/2019   BCR 15 09/03/2017   GFRAA >60 06/09/2019   GFRNONAA >60 06/09/2019    Hepatic Lab Results  Component Value Date   AST 26 06/09/2019   ALT 20 06/09/2019   ALBUMIN 4.1 06/09/2019   ALKPHOS 29 (L) 06/09/2019   LIPASE 35 06/09/2019    Electrolytes Lab Results  Component Value Date   NA 135 06/09/2019   K 4.2 06/09/2019   CL 102 06/09/2019   CALCIUM 8.4 (L) 06/09/2019   MG 1.9 09/03/2017    Bone Lab Results  Component Value Date   25OHVITD1 25 (L) 09/03/2017   25OHVITD2 2.9 09/03/2017   25OHVITD3 22 09/03/2017    Inflammation (CRP: Acute Phase) (ESR: Chronic Phase) Lab Results  Component Value Date   CRP 2 09/03/2017   ESRSEDRATE 21 09/03/2017         Note: Above Lab results reviewed.  Recent Imaging Review  DG Chest 2 View CLINICAL DATA:  Cough and congestion  EXAM: CHEST - 2 VIEW  COMPARISON:  Jun 17, 2018  FINDINGS: Lungs are clear. Heart size and pulmonary vascularity are normal. No adenopathy. No pneumothorax. No bone lesions.  IMPRESSION: Lungs clear.  Cardiac silhouette normal.  Electronically Signed   By: Lowella Grip III M.D.   On: 04/20/2020 12:24 Note: Reviewed        Physical Exam  General appearance: Well nourished, well developed, and well hydrated. In no apparent acute distress Mental status: Alert, oriented x 3 (person, place, & time)       Respiratory: No evidence of acute respiratory distress Eyes: PERLA Vitals: BP 132/85   Pulse 88   Temp (!) 96.6 F (35.9 C)   Ht _0  (1.6 m)   Wt 150 lb (68 kg)   SpO2 100%   BMI 26.57 kg/m  BMI: Estimated body mass index is 26.57 kg/m as calculated from the following:   Height as of this encounter: _1  (1.6 m).   Weight as of this encounter: 150 lb (68 kg). Ideal: Ideal body weight: 52.4 kg (115 lb 8.3 oz) Adjusted  ideal body weight: 58.7 kg (129 lb 5 oz)  Assessment   Status Diagnosis  Controlled Controlled Controlled 1. Chronic neck pain (1ry area of Pain) (Bilateral) (R>L)   2. Cervical facet syndrome (Bilateral) (R>L)   3. Cervical spine pain   4. Cervicalgia   5. Chronic upper back pain (2ry area of Pain) (Bilateral) (R>L)   6. Chronic shoulder pain (Bilateral) (R>L)   7. Chronic low back pain (3ry area of Pain) (Bilateral) (R>L) w/o sciatica   8. Chronic musculoskeletal pain   9. Chronic pain syndrome   10. Pharmacologic therapy   11. Chronic use of opiate for therapeutic purpose   12. Encounter for chronic pain management   13. Encounter for medication management   14. Psychiatric illness   15. Anxiety   16. Generalized anxiety disorder   17. Recurrent major depressive disorder, in partial remission ([place])      Updated Problems: Problem  Chronic low back pain (3ry area of Pain) (Bilateral) (R>L) w/o sciatica  Psychiatric Illness    Plan of Care  Problem-specific:  No problem-specific Assessment & Plan notes found for this encounter.  Ms. TSERING LEAMAN has a current medication list which includes the following long-term medication(s): [START ON 11/08/2020] oxycodone, [START ON 12/08/2020] oxycodone, [START  ON 01/07/2021] oxycodone, baclofen, and gabapentin.  Pharmacotherapy (Medications Ordered): Meds ordered this encounter  Medications   oxyCODONE (OXY IR/ROXICODONE) 5 MG immediate release tablet    Sig: Take 1 tablet (5 mg total) by mouth 2 (two) times daily. Must last 30 days    Dispense:  60 tablet    Refill:  0    DO NOT: delete (not duplicate); no partial-fill (will deny script to complete), no refill request (F/U required). DISPENSE: 1 day early if closed on fill date. WARN: No CNS-depressants within 8 hrs of med.   oxyCODONE (OXY IR/ROXICODONE) 5 MG immediate release tablet    Sig: Take 1 tablet (5 mg total) by mouth 2 (two) times daily. Must last 30 days     Dispense:  60 tablet    Refill:  0    DO NOT: delete (not duplicate); no partial-fill (will deny script to complete), no refill request (F/U required). DISPENSE: 1 day early if closed on fill date. WARN: No CNS-depressants within 8 hrs of med.   oxyCODONE (OXY IR/ROXICODONE) 5 MG immediate release tablet    Sig: Take 1 tablet (5 mg total) by mouth 2 (two) times daily. Must last 30 days    Dispense:  60 tablet    Refill:  0    DO NOT: delete (not duplicate); no partial-fill (will deny script to complete), no refill request (F/U required). DISPENSE: 1 day early if closed on fill date. WARN: No CNS-depressants within 8 hrs of med.    Orders:  Orders Placed This Encounter  Procedures   Ambulatory referral to Psychiatry    Referral Priority:   Routine    Referral Type:   Psychiatric    Referral Reason:   Specialty Services Required    Referred to Provider:   Gonzella Lex, MD    Requested Specialty:   Psychiatry    Number of Visits Requested:   1    Follow-up plan:   Return in about 3 months (around 02/06/2021) for Eval-day (M,W), (F2F), (MM).     Interventional Therapies  Risk  Complexity Considerations:   WNL   Planned  Pending:   Therapeutic/palliative left cervical facet RFA #3 (last done 10/23/2017)    Under consideration:   Diagnostic right suprascapular NB  Diagnostic bilateral lumbar facet block    Completed:   Palliative right glenohumeral joint injection x2  Palliative right AC joint injection x3  Palliative left medial epicondyle elbow injection x1  Diagnostic left GONB x1  Palliative right CESI (Series x1)  Palliative bilateral cervical facet block  Palliative right cervical facet RFA x3 (04/13/2020) Palliative left cervical facet RFA x2 (10/23/2017)   Therapeutic  Palliative (PRN) options:   Palliative right glenohumeral joint injection #3  Palliative right AC joint injection #4  Palliative left medial epicondyle elbow injection #2  Diagnostic left GONB #2   Palliative right CESI (Series #2)  Palliative bilateral cervical facet block  Palliative right cervical facet RFA #4  Palliative left cervical facet RFA #3      Recent Visits No visits were found meeting these conditions. Showing recent visits within past 90 days and meeting all other requirements Today's Visits Date Type Provider Dept  11/01/20 Office Visit Milinda Pointer, MD [facility]  Showing today's visits and meeting all other requirements Future Appointments No visits were found meeting these conditions. Showing future appointments within next 90 days and meeting all other requirements I discussed the assessment and treatment plan with the patient. The  patient was provided an opportunity to ask questions and all were answered. The patient agreed with the plan and demonstrated an understanding of the instructions.  Patient advised to call back or seek an in-person evaluation if the symptoms or condition worsens.  Duration of encounter: 30 minutes.  Note by: Gaspar Cola, MD Date: 11/01/2020; Time: 12:33 PM

## 2020-11-01 ENCOUNTER — Encounter: Payer: Self-pay | Admitting: Pain Medicine

## 2020-11-01 ENCOUNTER — Ambulatory Visit: Payer: Medicaid Other | Attending: Pain Medicine | Admitting: Pain Medicine

## 2020-11-01 ENCOUNTER — Other Ambulatory Visit: Payer: Self-pay

## 2020-11-01 VITALS — BP 132/85 | HR 88 | Temp 96.6°F | Ht 63.0 in | Wt 150.0 lb

## 2020-11-01 DIAGNOSIS — F99 Mental disorder, not otherwise specified: Secondary | ICD-10-CM | POA: Diagnosis present

## 2020-11-01 DIAGNOSIS — M25511 Pain in right shoulder: Secondary | ICD-10-CM | POA: Insufficient documentation

## 2020-11-01 DIAGNOSIS — G894 Chronic pain syndrome: Secondary | ICD-10-CM | POA: Insufficient documentation

## 2020-11-01 DIAGNOSIS — M545 Low back pain, unspecified: Secondary | ICD-10-CM | POA: Diagnosis present

## 2020-11-01 DIAGNOSIS — F419 Anxiety disorder, unspecified: Secondary | ICD-10-CM | POA: Diagnosis present

## 2020-11-01 DIAGNOSIS — M542 Cervicalgia: Secondary | ICD-10-CM | POA: Diagnosis not present

## 2020-11-01 DIAGNOSIS — M549 Dorsalgia, unspecified: Secondary | ICD-10-CM | POA: Diagnosis not present

## 2020-11-01 DIAGNOSIS — M7918 Myalgia, other site: Secondary | ICD-10-CM | POA: Insufficient documentation

## 2020-11-01 DIAGNOSIS — G8929 Other chronic pain: Secondary | ICD-10-CM | POA: Insufficient documentation

## 2020-11-01 DIAGNOSIS — Z79891 Long term (current) use of opiate analgesic: Secondary | ICD-10-CM | POA: Insufficient documentation

## 2020-11-01 DIAGNOSIS — F411 Generalized anxiety disorder: Secondary | ICD-10-CM | POA: Insufficient documentation

## 2020-11-01 DIAGNOSIS — F3341 Major depressive disorder, recurrent, in partial remission: Secondary | ICD-10-CM | POA: Insufficient documentation

## 2020-11-01 DIAGNOSIS — M47812 Spondylosis without myelopathy or radiculopathy, cervical region: Secondary | ICD-10-CM | POA: Insufficient documentation

## 2020-11-01 DIAGNOSIS — M25512 Pain in left shoulder: Secondary | ICD-10-CM | POA: Insufficient documentation

## 2020-11-01 DIAGNOSIS — Z79899 Other long term (current) drug therapy: Secondary | ICD-10-CM | POA: Insufficient documentation

## 2020-11-01 MED ORDER — OXYCODONE HCL 5 MG PO TABS: 5.0000 mg | ORAL_TABLET | Freq: Two times a day (BID) | ORAL | 0 refills | Status: DC

## 2020-11-01 NOTE — Progress Notes (Signed)
Nursing Pain Medication Assessment:  Safety precautions to be maintained throughout the outpatient stay will include: orient to surroundings, keep bed in low position, maintain call bell within reach at all times, provide assistance with transfer out of bed and ambulation.  Medication Inspection Compliance: Pill count conducted under aseptic conditions, in front of the patient. Neither the pills nor the bottle was removed from the patient's sight at any time. Once count was completed pills were immediately returned to the patient in their original bottle.  Medication: Oxycodone IR Pill/Patch Count:  0 of 60 pills remain Pill/Patch Appearance: Markings consistent with prescribed medication Bottle Appearance: Standard pharmacy container. Clearly labeled. Filled Date: 84 / 19 / 2022 Last Medication intake:  Today Safety precautions to be maintained throughout the outpatient stay will include: orient to surroundings, keep bed in low position, maintain call bell within reach at all times, provide assistance with transfer out of bed and ambulation.

## 2020-11-15 ENCOUNTER — Other Ambulatory Visit: Payer: Self-pay | Admitting: Gastroenterology

## 2020-11-15 ENCOUNTER — Other Ambulatory Visit (HOSPITAL_COMMUNITY): Payer: Self-pay | Admitting: Gastroenterology

## 2020-11-15 DIAGNOSIS — R1084 Generalized abdominal pain: Secondary | ICD-10-CM

## 2020-12-07 ENCOUNTER — Other Ambulatory Visit: Payer: Self-pay

## 2020-12-07 ENCOUNTER — Ambulatory Visit
Admission: RE | Admit: 2020-12-07 | Discharge: 2020-12-07 | Disposition: A | Discharge status: Home / Self Care | Payer: Medicaid Other | Source: Ambulatory Visit | Attending: Gastroenterology | Admitting: Gastroenterology

## 2020-12-07 DIAGNOSIS — R1084 Generalized abdominal pain: Secondary | ICD-10-CM | POA: Diagnosis present

## 2020-12-07 MED ORDER — IOHEXOL 300 MG/ML  SOLN
100.0000 mL | Freq: Once | INTRAMUSCULAR | Status: AC | PRN
  Administered 2020-12-07: 13:00:00 100 mL via INTRAVENOUS

## 2020-12-15 ENCOUNTER — Other Ambulatory Visit: Payer: Self-pay

## 2020-12-15 ENCOUNTER — Ambulatory Visit
Admission: EM | Admit: 2020-12-15 | Discharge: 2020-12-15 | Disposition: A | Discharge status: Home / Self Care | Payer: Medicaid Other | Attending: Physician Assistant | Admitting: Physician Assistant

## 2020-12-15 ENCOUNTER — Encounter: Payer: Self-pay | Admitting: Licensed Clinical Social Worker

## 2020-12-15 DIAGNOSIS — R051 Acute cough: Secondary | ICD-10-CM

## 2020-12-15 DIAGNOSIS — J209 Acute bronchitis, unspecified: Secondary | ICD-10-CM | POA: Diagnosis not present

## 2020-12-15 DIAGNOSIS — R062 Wheezing: Secondary | ICD-10-CM | POA: Diagnosis not present

## 2020-12-15 MED ORDER — PROMETHAZINE-DM 6.25-15 MG/5ML PO SYRP: 5.0000 mL | ORAL_SOLUTION | Freq: Four times a day (QID) | ORAL | 0 refills | Status: DC | PRN

## 2020-12-15 MED ORDER — PREDNISONE 20 MG PO TABS: 40.0000 mg | ORAL_TABLET | Freq: Every day | ORAL | 0 refills | Status: AC

## 2020-12-15 MED ORDER — DOXYCYCLINE HYCLATE 100 MG PO CAPS: 100.0000 mg | ORAL_CAPSULE | Freq: Two times a day (BID) | ORAL | 0 refills | Status: AC

## 2020-12-15 MED ORDER — ALBUTEROL SULFATE HFA 108 (90 BASE) MCG/ACT IN AERS: 1.0000 | INHALATION_SPRAY | Freq: Four times a day (QID) | RESPIRATORY_TRACT | 2 refills | Status: DC | PRN

## 2020-12-15 NOTE — ED Provider Notes (Signed)
[facility]    CSN: [id number] Arrival date & time: 12/15/20  1401      History   Chief Complaint Chief Complaint  Patient presents with   Cough    HPI Tanya [name] is a 60 y.o. female presenting for 2-week history of cough that is productive of yellowish sputum.  Patient reports increased fatigue, chest tightness, wheezing and shortness of breath.  She also reports that she has 2 sores on her ears.  Patient does have history of lupus of note.  Patient also has history of collagen vascular disease and is a longtime smoker with possible COPD that has been undiagnosed.  She reports that she has multiple episodes of bronchitis yearly.  Says she normally requires antibiotics and steroids as well as inhalers.  Presently she reports that she has had COVID-19 in the past and does not believe her symptoms are consistent with that.  She feels that her symptoms are consistent with chronic bronchitis.  Patient has tried over-the-counter cough medicines.  Has been taking her normal opioid medication for chronic pain as well.  No other complaints today.  HPI  Past Medical History:  Diagnosis Date   Anxiety    Bladder infection    8/18   Chronic lower back pain    Chronic neck pain    Collagen vascular disease ([place])    Depression    Diverticulitis    Hyperlipidemia    Lupus ([place])    Overactive bladder     Patient Active Problem List   Diagnosis Date Noted   Psychiatric illness 11/01/2020   Chronic use of opiate for therapeutic purpose 06/25/2020   Arthropathy of cervical facet joint (Bilateral) 04/13/2020   Cervical spine pain 04/13/2020   Abnormal MRI, cervical spine (09/20/2015) 04/13/2020   Cervical central spinal stenosis (Multilevel) 04/13/2020   Cervical foraminal stenosis (Right: C3-4) (Bilateral: C4-5, C5-6) 04/13/2020   DDD (degenerative disc disease), cervical 03/14/2020   Chronic shoulder pain (Left) 09/06/2019   Osteoarthritis of shoulder (Left)  09/06/2019   Osteoarthritis of shoulders (Bilateral) (R>L) 10/25/2018   Osteoarthritis of AC (acromioclavicular) joint (Right) 04/02/2018   Chronic acromioclavicular joint pain (Right) 04/02/2018   Chronic shoulder pain (Right) 04/02/2018   Epicondylitis elbow, medial (Left) 04/02/2018   Chronic elbow pain (Left) 03/25/2018   Occipital headache (Left) 12/10/2017   Neurogenic pain 12/10/2017   Cervico-occipital neuralgia (Left) 12/10/2017   Pharmacologic therapy 09/01/2017   Disorder of skeletal system 09/01/2017   Problems influencing health status 09/01/2017   Spondylosis without myelopathy or radiculopathy, cervical region 08/05/2017   Cervicalgia 08/05/2017   Rash 05/13/2017   Acute postoperative pain 06/26/2016   Cervical spondylosis 04/15/2016   Cervical facet syndrome (Bilateral) (R>L) 04/15/2016   Shoulder radicular pain (Bilateral) (R>L) 04/15/2016   Chronic musculoskeletal pain 03/07/2016   Vitamin D insufficiency 12/19/2015   Anxiety 11/23/2015   Chronic low back pain (3ry area of Pain) (Bilateral) (R>L) w/o sciatica 11/23/2015   Chronic neck pain (1ry area of Pain) (Bilateral) (R>L) 11/23/2015   Long term prescription benzodiazepine use 11/23/2015   Long term current use of opiate analgesic 11/23/2015   Long term prescription opiate use 11/23/2015   Opiate use (10 MME/Day) 11/23/2015   Chronic pain syndrome 11/23/2015   Chronic upper extremity pain (Right) 11/23/2015   Chronic cervical radicular pain (Right) 11/23/2015   Chronic shoulder pain (Bilateral) (R>L) 11/23/2015   Hypercholesterolemia 06/20/2015   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   Encounter for long-term (current) use of  high-risk medication 06/06/2015   Pain medication agreement signed 11/24/2014   Hyperlipidemia 01/25/2014   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   Diverticulitis 10/04/2012   Osteoarthritis 10/04/2012   Chronic upper back pain (2ry area of Pain) (Bilateral) (R>L) 08/17/2012   Urinary  incontinence 08/17/2012   Fibromyalgia 06/04/2012   SLE (systemic lupus erythematosus) ([place]) 02/18/2012   Generalized anxiety disorder 01/07/2012   Major depressive disorder, recurrent ([place]) 09/29/2009    Past Surgical History:  Procedure Laterality Date   ABDOMINAL HYSTERECTOMY     COLONOSCOPY     COLONOSCOPY WITH PROPOFOL N/A 07/03/2017   Procedure: COLONOSCOPY WITH PROPOFOL;  Surgeon: [name], MD;  Location: [facility] ENDOSCOPY;  Service: Endoscopy;  Laterality: N/A;   COLONOSCOPY WITH PROPOFOL N/A 10/21/2017   Procedure: COLONOSCOPY WITH PROPOFOL;  Surgeon: [name], MD;  Location: [facility] ENDOSCOPY;  Service: Endoscopy;  Laterality: N/A;   ESOPHAGOGASTRODUODENOSCOPY N/A 10/21/2017   Procedure: ESOPHAGOGASTRODUODENOSCOPY (EGD);  Surgeon: [name], MD;  Location: [facility] ENDOSCOPY;  Service: Endoscopy;  Laterality: N/A;   TONSILLECTOMY      OB History   No obstetric history on file.      Home Medications    Prior to Admission medications   Medication Sig Start Date End Date Taking? Authorizing Provider  albuterol (VENTOLIN HFA) 108 (90 Base) MCG/ACT inhaler Inhale 1-2 puffs into the lungs every 6 (six) hours as needed for wheezing or shortness of breath. 12/15/20  Yes Laurene Footman B, PA-C  atorvastatin (LIPITOR) 80 MG tablet Take 80 mg by mouth daily. 04/19/20  Yes [provider]  busPIRone (BUSPAR) 15 MG tablet Take 30 mg by mouth 2 (two) times daily as needed. 04/19/20  Yes [provider]  doxycycline (VIBRAMYCIN) 100 MG capsule Take 1 capsule (100 mg total) by mouth 2 (two) times daily for 7 days. 12/15/20 12/22/20 Yes Danton Clap, PA-C  DULoxetine (CYMBALTA) 30 MG capsule Take 30 mg by mouth daily.   Yes [provider]  oxyCODONE (OXY IR/ROXICODONE) 5 MG immediate release tablet Take 1 tablet (5 mg total) by mouth 2 (two) times daily. Must last 30 days 12/08/20 01/07/21 Yes Milinda Pointer, MD  oxyCODONE (OXY  IR/ROXICODONE) 5 MG immediate release tablet Take 1 tablet (5 mg total) by mouth 2 (two) times daily. Must last 30 days 01/07/21 02/06/21 Yes Milinda Pointer, MD  predniSONE (DELTASONE) 20 MG tablet Take 2 tablets (40 mg total) by mouth daily for 5 days. 12/15/20 12/20/20 Yes Danton Clap, PA-C  promethazine-dextromethorphan (PROMETHAZINE-DM) 6.25-15 MG/5ML syrup Take 5 mLs by mouth 4 (four) times daily as needed for cough. 12/15/20  Yes Danton Clap, PA-C  Spacer/Aero-Holding Chambers (AEROCHAMBER MV) inhaler Use as instructed 07/09/20  Yes Margarette Canada, NP  baclofen (LIORESAL) 10 MG tablet Take 1-2 tablets (10-20 mg total) by mouth at bedtime. 06/26/20 09/24/20  Milinda Pointer, MD  gabapentin (NEURONTIN) 300 MG capsule Take 1 capsule (300 mg total) by mouth 3 (three) times daily. 06/26/20 10/03/20  Milinda Pointer, MD  oxyCODONE (OXY IR/ROXICODONE) 5 MG immediate release tablet Take 1 tablet (5 mg total) by mouth 2 (two) times daily. Must last 30 days 11/08/20 12/08/20  Milinda Pointer, MD    Family History Family History  Problem Relation Age of Onset   Cancer Mother    Hypertension Mother    Emphysema Mother    Stroke Mother    Glaucoma Father    Heart disease Father    Diabetes Brother     Social History  Social History   Tobacco Use   Smoking status: Every Day    Packs/day: 0.50    Years: 35.00    Pack years: 17.50    Types: Cigarettes   Smokeless tobacco: Never  Vaping Use   Vaping Use: Never used  Substance Use Topics   Alcohol use: No   Drug use: No     Allergies   Pregabalin, Trazodone, Bupropion, Methocarbamol, and Tramadol   Review of Systems Review of Systems  Constitutional:  Positive for fatigue. Negative for chills, diaphoresis and fever.  HENT:  Positive for congestion. Negative for ear pain, rhinorrhea, sinus pressure, sinus pain and sore throat.   Respiratory:  Positive for cough, chest tightness, shortness of breath and wheezing.    Gastrointestinal:  Negative for abdominal pain, nausea and vomiting.  Musculoskeletal:  Negative for arthralgias and myalgias.  Skin:  Positive for wound. Negative for rash.  Neurological:  Negative for weakness and headaches.  Hematological:  Negative for adenopathy.  Psychiatric/Behavioral:  Positive for sleep disturbance (due to cough).     Physical Exam Triage Vital Signs ED Triage Vitals  Enc Vitals Group     BP 12/15/20 1546 (!) 144/78     Pulse Rate 12/15/20 1546 85     Resp 12/15/20 1546 16     Temp 12/15/20 1546 97.8 F (36.6 C)     Temp Source 12/15/20 1546 Oral     SpO2 12/15/20 1546 99 %     Weight --      Height --      Head Circumference --      Peak Flow --      Pain Score 12/15/20 1543 0     Pain Loc --      Pain Edu? --      Excl. in [place]? --    No data found.  Updated Vital Signs BP (!) 144/78 (BP Location: Left Arm)   Pulse 85   Temp 97.8 F (36.6 C) (Oral)   Resp 16   SpO2 99%     Physical Exam Vitals and nursing note reviewed.  Constitutional:      General: She is not in acute distress.    Appearance: Normal appearance. She is ill-appearing. She is not toxic-appearing.  HENT:     Head: Normocephalic and atraumatic.     Nose: Nose normal.     Mouth/Throat:     Mouth: Mucous membranes are moist.     Pharynx: Oropharynx is clear.  Eyes:     General: No scleral icterus.       Right eye: No discharge.        Left eye: No discharge.     Conjunctiva/sclera: Conjunctivae normal.  Cardiovascular:     Rate and Rhythm: Normal rate and regular rhythm.     Heart sounds: Normal heart sounds.  Pulmonary:     Effort: Pulmonary effort is normal. No respiratory distress.     Breath sounds: Wheezing (diffuse wheezing throughout all lung fields) present.  Musculoskeletal:     Cervical back: Neck supple.  Skin:    General: Skin is dry.  Neurological:     General: No focal deficit present.     Mental Status: She is alert. Mental status is at baseline.      Motor: No weakness.     Gait: Gait normal.  Psychiatric:        Mood and Affect: Mood normal.        Behavior: Behavior normal.  Thought Content: Thought content normal.     UC Treatments / Results  Labs (all labs ordered are listed, but only abnormal results are displayed) Labs Reviewed - No data to display  EKG   Radiology No results found.  Procedures Procedures (including critical care time)  Medications Ordered in UC Medications - No data to display  Initial Impression / Assessment and Plan / UC Course  I have reviewed the triage vital signs and the nursing notes.  Pertinent labs & imaging results that were available during my care of the patient were reviewed by me and considered in my medical decision making (see chart for details).  60 year old female presenting for 2-week history of productive cough, wheezing, chest tightness and shortness of breath.  Vitals are stable.  She is ill-appearing but nontoxic.  She does have diffuse wheezing throughout all lung fields.   Not concerned about COVID-19 at this point since she is already been sick for 2 weeks.  We also discussed getting a chest x-ray but patient reports having normal chest x-rays each time she gets them and being treated for bronchitis.  She believes her symptoms are consistent with bronchitis.  I do agree and plan to treat with doxycycline, prednisone, Promethazine DM and albuterol inhaler.  Advised patient it is highly likely that she does have COPD given her longstanding tobacco abuse.  Advised her to try to cut back and quit.  Also advised her to make a follow-up appointment with PCP so that she can get appropriately tested and diagnosed for COPD.  Advised to increase rest and fluids.  Reviewed return and ED precautions with patient.  Final Clinical Impressions(s) / UC Diagnoses   Final diagnoses:  Acute bronchitis, unspecified organism  Acute cough  Wheezing     Discharge Instructions       -Since you have had symptoms for 2 weeks and have a history of recurrent bronchitis, I have sent doxycycline, prednisone, albuterol inhaler and cough medicine.  Very high suspicion that you have underlying COPD undiagnosed.  Important that you make a follow-up appointment with your PCP and request referral to pulmonology so that you can have proper testing done. - Also as we discussed, you really need to cut back on smoking and eventually stop. - Increase rest and fluids. - Follow back up with [place] or ER if you develop fever, chest pain or increased breathing difficulty or weakness.   ED Prescriptions     Medication Sig Dispense Auth. Provider   doxycycline (VIBRAMYCIN) 100 MG capsule Take 1 capsule (100 mg total) by mouth 2 (two) times daily for 7 days. 14 capsule Laurene Footman B, PA-C   predniSONE (DELTASONE) 20 MG tablet Take 2 tablets (40 mg total) by mouth daily for 5 days. 10 tablet Laurene Footman B, PA-C   albuterol (VENTOLIN HFA) 108 (90 Base) MCG/ACT inhaler Inhale 1-2 puffs into the lungs every 6 (six) hours as needed for wheezing or shortness of breath. 1 g Danton Clap, PA-C   promethazine-dextromethorphan (PROMETHAZINE-DM) 6.25-15 MG/5ML syrup Take 5 mLs by mouth 4 (four) times daily as needed for cough. 118 mL Danton Clap, PA-C      PDMP not reviewed this encounter.   Danton Clap, PA-C 12/15/20 1622

## 2020-12-15 NOTE — ED Triage Notes (Signed)
Pt c/o congestion, cough, sores in her both ears. Cough worse at night. Wheezing and fatigue

## 2020-12-15 NOTE — Discharge Instructions (Signed)
-  Since you have had symptoms for 2 weeks and have a history of recurrent bronchitis, I have sent doxycycline, prednisone, albuterol inhaler and cough medicine.  Very high suspicion that you have underlying COPD undiagnosed.  Important that you make a follow-up appointment with your PCP and request referral to pulmonology so that you can have proper testing done. - Also as we discussed, you really need to cut back on smoking and eventually stop. - Increase rest and fluids. - Follow back up with [place] or ER if you develop fever, chest pain or increased breathing difficulty or weakness.

## 2020-12-18 ENCOUNTER — Other Ambulatory Visit: Payer: Self-pay | Admitting: Gastroenterology

## 2020-12-18 DIAGNOSIS — K839 Disease of biliary tract, unspecified: Secondary | ICD-10-CM

## 2020-12-21 ENCOUNTER — Other Ambulatory Visit: Payer: Self-pay | Admitting: Infectious Diseases

## 2020-12-21 DIAGNOSIS — Z1231 Encounter for screening mammogram for malignant neoplasm of breast: Secondary | ICD-10-CM

## 2020-12-24 ENCOUNTER — Ambulatory Visit
Admission: RE | Admit: 2020-12-24 | Discharge: 2020-12-24 | Disposition: A | Discharge status: Home / Self Care | Payer: Medicaid Other | Source: Ambulatory Visit | Attending: Gastroenterology | Admitting: Gastroenterology

## 2020-12-24 ENCOUNTER — Other Ambulatory Visit: Payer: Self-pay | Admitting: Gastroenterology

## 2020-12-24 ENCOUNTER — Other Ambulatory Visit: Payer: Self-pay

## 2020-12-24 DIAGNOSIS — K839 Disease of biliary tract, unspecified: Secondary | ICD-10-CM | POA: Insufficient documentation

## 2020-12-24 MED ORDER — GADOBUTROL 1 MMOL/ML IV SOLN
10.0000 mL | Freq: Once | INTRAVENOUS | Status: AC | PRN
  Administered 2020-12-24: 15:00:00 7 mL via INTRAVENOUS

## 2021-01-04 ENCOUNTER — Encounter: Payer: Self-pay | Admitting: Pain Medicine

## 2021-01-16 ENCOUNTER — Other Ambulatory Visit: Payer: Self-pay | Admitting: *Deleted

## 2021-01-16 DIAGNOSIS — Z87891 Personal history of nicotine dependence: Secondary | ICD-10-CM

## 2021-01-16 DIAGNOSIS — F17210 Nicotine dependence, cigarettes, uncomplicated: Secondary | ICD-10-CM

## 2021-01-29 NOTE — Progress Notes (Signed)
PROVIDER NOTE: Information contained herein reflects review and annotations entered in association with encounter. Interpretation of such information and data should be left to medically-trained personnel. Information provided to patient can be located elsewhere in the medical record under "Patient Instructions". Document created using STT-dictation technology, any transcriptional errors that may result from process are unintentional.    Patient: Tanya [name]  Service Category: E/M  Provider: Gaspar Cola, MD  DOB: [birth year]  DOS: 01/31/2021  Specialty: Interventional Pain Management  MRN: [id number]  Setting: Ambulatory outpatient  PCP: [facility]  Type: Established Patient    Referring Provider: Valera Castle, *  Location: Office  Delivery: Face-to-face     HPI  Tanya [name], a 61 y.o. year old female, is here today because of her Chronic pain syndrome [G89.4]. Tanya [name] primary complain today is Neck Pain Last encounter: My last encounter with her was on 11/01/2020. Pertinent problems: Tanya [name] has Chronic upper back pain (2ry area of Pain) (Bilateral) (R>L); Chronic low back pain (3ry area of Pain) (Bilateral) (R>L) w/o sciatica; Chronic neck pain (1ry area of Pain) (Bilateral) (R>L); Fibromyalgia; Osteoarthritis; Chronic pain syndrome; Chronic upper extremity pain (Right); Chronic cervical radicular pain (Right); Chronic shoulder pain (Bilateral) (R>L); Chronic musculoskeletal pain; Cervical spondylosis; Cervical facet syndrome (Bilateral) (R>L); Shoulder radicular pain (Bilateral) (R>L); Acute postoperative pain; Spondylosis without myelopathy or radiculopathy, cervical region; Cervicalgia; Occipital headache (Left); Neurogenic pain; Cervico-occipital neuralgia (Left); Chronic elbow pain (Left); Osteoarthritis of AC (acromioclavicular) joint (Right); Chronic acromioclavicular joint pain (Right); Chronic shoulder pain (Right); Epicondylitis elbow,  medial (Left); 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 Pain Assessment: Severity of Chronic pain is reported as a 1 /10. Location: Neck Right, Left/Denies. Onset: More than a month ago. Quality: Aching, Burning, Constant, Throbbing. Timing: Constant. Modifying factor(s): Meds, heat at times. Vitals:  height is _0  (1.6 m) and weight is 154 lb (69.9 kg). Her temperature is 97.1 F (36.2 C) (abnormal). Her blood pressure is 146/81 (abnormal) and her pulse is 91. Her respiration is 15 and oxygen saturation is 97%.   Reason for encounter: medication management. UDS ordered today.   The patient indicates doing well with the current medication regimen. No adverse reactions or side effects reported to the medications.   RTCB: 05/07/2021 Nonopioids transfer 12/22/2019: Baclofen and Neurontin  Pharmacotherapy Assessment  Analgesic: Oxycodone IR 5 mg, 1 tab PO BID (10 mg/day of oxycodone) MME/day: 15 mg/day.   Monitoring: [place] PMP: PDMP reviewed during this encounter.       Pharmacotherapy: No side-effects or adverse reactions reported. Compliance: No problems identified. Effectiveness: Clinically acceptable.  Chauncey Fischer, RN  01/31/2021 11:58 AM  Sign when Signing Visit Nursing Pain Medication Assessment:  Safety precautions to be maintained throughout the outpatient stay will include: orient to surroundings, keep bed in low position, maintain call bell within reach at all times, provide assistance with transfer out of bed and ambulation.  Medication Inspection Compliance: Pill count conducted under aseptic conditions, in front of  the patient. Neither the pills nor the bottle was removed from the patient's sight at any time. Once count was completed pills were immediately returned to the patient in their original bottle.  Medication: Oxycodone IR Pill/Patch  Count:  0 of 60 pills remain Pill/Patch Appearance: Markings consistent with prescribed medication Bottle Appearance: Standard pharmacy container. Clearly labeled. Filled Date: 26 / 18 / 2022 Last Medication intake:  TodaySafety precautions to be maintained throughout the outpatient stay will include: orient to surroundings, keep bed in low position, maintain call bell within reach at all times, provide assistance with transfer out of bed and ambulation.     UDS:  Summary  Date Value Ref Range Status  06/26/2020 Note  Final    Comment:    ==================================================================== Tox[employer] Select 13 (MW) ==================================================================== Test                             Result       Flag       Units  Drug Absent but Declared for Prescription Verification   Oxycodone                      Not Detected UNEXPECTED ng/mg creat ==================================================================== Test                      Result    Flag   Units      Ref Range   Creatinine              21               mg/dL      >=20 ==================================================================== Declared Medications:  The flagging and interpretation on this report are based on the  following declared medications.  Unexpected results may arise from  inaccuracies in the declared medications.   **Note: The testing scope of this panel includes these medications:   Oxycodone   **Note: The testing scope of this panel does not include the  following reported medications:   Atorvastatin (Lipitor)  Baclofen (Lioresal)  Buspirone (Buspar)  Duloxetine (Cymbalta)  Gabapentin  (Neurontin) ==================================================================== For clinical consultation, please call [phone]. ====================================================================      ROS  Constitutional: Denies any fever or chills Gastrointestinal: No reported hemesis, hematochezia, vomiting, or acute GI distress Musculoskeletal: Denies any acute onset joint swelling, redness, loss of ROM, or weakness Neurological: No reported episodes of acute onset apraxia, aphasia, dysarthria, agnosia, amnesia, paralysis, loss of coordination, or loss of consciousness  Medication Review  AeroChamber MV, DULoxetine, albuterol, atorvastatin, baclofen, busPIRone, diazepam, gabapentin, oxyCODONE, and promethazine-dextromethorphan  History Review  Allergy: Tanya [name] is allergic to pregabalin, trazodone, bupropion, methocarbamol, and tramadol. Drug: Tanya [name]  reports no history of drug use. Alcohol:  reports no history of alcohol use. Tobacco:  reports that she has been smoking cigarettes. She has a 17.50 pack-year smoking history. She has never used smokeless tobacco. Social: Ms. Friedhoff  reports that she has been smoking cigarettes. She has a 17.50 pack-year smoking history. She has never used smokeless tobacco. She reports that she does not drink alcohol and does not use drugs. Medical:  has a past medical history of Anxiety, Bladder infection, Chronic lower back pain, Chronic neck pain, Collagen vascular disease ([place]), Depression, Diverticulitis, Hyperlipidemia, Lupus ([place]), and Overactive bladder. Surgical: Ms. Samad  has a past surgical history that includes Abdominal hysterectomy; Colonoscopy; Colonoscopy with propofol (N/A, 07/03/2017); Tonsillectomy; Esophagogastroduodenoscopy (N/A, 10/21/2017); and Colonoscopy with propofol (N/A, 10/21/2017). Family: family history includes Cancer in her mother; Diabetes in her brother; Emphysema in her mother; Glaucoma in her  father; Heart disease in her father; Hypertension in her mother; Stroke  in her mother.  Laboratory Chemistry Profile   Renal Lab Results  Component Value Date   BUN 7 06/09/2019   CREATININE 0.72 06/09/2019   BCR 15 09/03/2017   GFRAA >60 06/09/2019   GFRNONAA >60 06/09/2019    Hepatic Lab Results  Component Value Date   AST 26 06/09/2019   ALT 20 06/09/2019   ALBUMIN 4.1 06/09/2019   ALKPHOS 29 (L) 06/09/2019   LIPASE 35 06/09/2019    Electrolytes Lab Results  Component Value Date   NA 135 06/09/2019   K 4.2 06/09/2019   CL 102 06/09/2019   CALCIUM 8.4 (L) 06/09/2019   MG 1.9 09/03/2017    Bone Lab Results  Component Value Date   25OHVITD1 25 (L) 09/03/2017   25OHVITD2 2.9 09/03/2017   25OHVITD3 22 09/03/2017    Inflammation (CRP: Acute Phase) (ESR: Chronic Phase) Lab Results  Component Value Date   CRP 2 09/03/2017   ESRSEDRATE 21 09/03/2017         Note: Above Lab results reviewed.  Recent Imaging Review  MR 3D Recon At Scanner Addendum: ADDENDUM REPORT: 12/27/2020 05:05   ADDENDUM:  Upon further review, contrast enhanced images were performed, but  were not sent to PACS by the technologist. Those images demonstrated  no suspicious enhancement within the liver, indicative of benign  etiologies for the hepatic lesions as previously suggested, likely a  combination of cysts and/or biliary hamartomas. Contrast-enhanced  images of the pancreas and spleen were unremarkable. The lesion of  concern in the lower pole of the left kidney demonstrated no  enhancement on post gadolinium images, indicative of a small cyst.  No other significant findings were noted on contrast enhanced  images.   Electronically Signed    By: Vinnie Langton M.D.    On: 12/27/2020 05:05 Narrative: CLINICAL DATA:  61 year old female with history of abdominal swelling for 1 year. Renal lesion noted on prior CT examination.  EXAM: MRI ABDOMEN WITHOUT AND WITH CONTRAST (INCLUDING  MRCP)  TECHNIQUE: Multiplanar multisequence MR imaging of the abdomen was performed both before and after the administration of intravenous contrast. Heavily T2-weighted images of the biliary and pancreatic ducts were obtained, and three-dimensional MRCP images were rendered by post processing.  CONTRAST:  8m GADAVIST GADOBUTROL 1 MMOL/ML IV SOLN  COMPARISON:  None.  FINDINGS: Comment: Today's study is limited for detection and characterization of visceral and/or vascular lesions by lack of IV gadolinium.  Lower chest: Unremarkable.  Hepatobiliary: Multiple small T1 hypointense, T2 hyperintense lesions are scattered throughout the liver, largest of which measures 2.1 x 1.8 cm in the central aspect of segment 8 (axial image 13 of series 4), incompletely characterized on today's non-contrast CT examination, but likely to represent a combination of cysts and/or small biliary hamartomas. No definite suspicious hepatic lesions are confidently identified on today's noncontrast examination. No intrahepatic biliary ductal dilatation. Common bile duct is mildly dilated measuring 7 mm in the porta hepatis. No filling defect within the common bile duct to suggest choledocholithiasis. Gallbladder is normal in appearance.  Pancreas: No pancreatic mass confidently identified on today's noncontrast examination. No pancreatic ductal dilatation. No pancreatic or peripancreatic fluid collections or inflammatory changes.  Spleen:  Unremarkable.  Adrenals/Urinary Tract: In the posterior aspect of the lower pole of the left kidney there is a subcentimeter T1 isointense, T2 hypointense lesion which is incompletely characterized on today's noncontrast examination, but favored to represent a small cyst. Right kidney and bilateral adrenal glands are normal in appearance.  No hydroureteronephrosis in the visualized portions of the abdomen.  Stomach/Bowel: Colonic diverticulosis. Remaining  visualized portions are otherwise unremarkable.  Vascular/Lymphatic: No aneurysm identified in the visualized abdominal vasculature. No lymphadenopathy noted in the abdomen.  Other: No significant volume of ascites noted in the visualized portions of the peritoneal cavity.  Musculoskeletal: No aggressive appearing osseous lesions are noted in the visualized portions of the skeleton.  IMPRESSION: 1. The lesion of concern in the left kidney is subcentimeter in size and incompletely characterized on today's noncontrast examination, but likely a small cyst. 2. Mild prominence of the common bile duct which measures up to 7 mm in the porta hepatis. Given the lack of intrahepatic biliary ductal dilatation, this is of doubtful clinical significance, as there is no frank biliary obstruction. No choledocholithiasis. 3. Colonic diverticulosis.  Electronically Signed: By: Vinnie Langton M.D. On: 12/25/2020 14:41 MR ABDOMEN MRCP W WO CONTAST Addendum: ADDENDUM REPORT: 12/27/2020 05:05   ADDENDUM:  Upon further review, contrast enhanced images were performed, but  were not sent to PACS by the technologist. Those images demonstrated  no suspicious enhancement within the liver, indicative of benign  etiologies for the hepatic lesions as previously suggested, likely a  combination of cysts and/or biliary hamartomas. Contrast-enhanced  images of the pancreas and spleen were unremarkable. The lesion of  concern in the lower pole of the left kidney demonstrated no  enhancement on post gadolinium images, indicative of a small cyst.  No other significant findings were noted on contrast enhanced  images.   Electronically Signed    By: Vinnie Langton M.D.    On: 12/27/2020 05:05 Narrative: CLINICAL DATA:  61 year old female with history of abdominal swelling for 1 year. Renal lesion noted on prior CT examination.  EXAM: MRI ABDOMEN WITHOUT AND WITH CONTRAST (INCLUDING  MRCP)  TECHNIQUE: Multiplanar multisequence MR imaging of the abdomen was performed both before and after the administration of intravenous contrast. Heavily T2-weighted images of the biliary and pancreatic ducts were obtained, and three-dimensional MRCP images were rendered by post processing.  CONTRAST:  8m GADAVIST GADOBUTROL 1 MMOL/ML IV SOLN  COMPARISON:  None.  FINDINGS: Comment: Today's study is limited for detection and characterization of visceral and/or vascular lesions by lack of IV gadolinium.  Lower chest: Unremarkable.  Hepatobiliary: Multiple small T1 hypointense, T2 hyperintense lesions are scattered throughout the liver, largest of which measures 2.1 x 1.8 cm in the central aspect of segment 8 (axial image 13 of series 4), incompletely characterized on today's non-contrast CT examination, but likely to represent a combination of cysts and/or small biliary hamartomas. No definite suspicious hepatic lesions are confidently identified on today's noncontrast examination. No intrahepatic biliary ductal dilatation. Common bile duct is mildly dilated measuring 7 mm in the porta hepatis. No filling defect within the common bile duct to suggest choledocholithiasis. Gallbladder is normal in appearance.  Pancreas: No pancreatic mass confidently identified on today's noncontrast examination. No pancreatic ductal dilatation. No pancreatic or peripancreatic fluid collections or inflammatory changes.  Spleen:  Unremarkable.  Adrenals/Urinary Tract: In the posterior aspect of the lower pole of the left kidney there is a subcentimeter T1 isointense, T2 hypointense lesion which is incompletely characterized on today's noncontrast examination, but favored to represent a small cyst. Right kidney and bilateral adrenal glands are normal in appearance. No hydroureteronephrosis in the visualized portions of the abdomen.  Stomach/Bowel: Colonic diverticulosis. Remaining  visualized portions are otherwise unremarkable.  Vascular/Lymphatic: No aneurysm identified in the visualized abdominal vasculature. No lymphadenopathy  noted in the abdomen.  Other: No significant volume of ascites noted in the visualized portions of the peritoneal cavity.  Musculoskeletal: No aggressive appearing osseous lesions are noted in the visualized portions of the skeleton.  IMPRESSION: 1. The lesion of concern in the left kidney is subcentimeter in size and incompletely characterized on today's noncontrast examination, but likely a small cyst. 2. Mild prominence of the common bile duct which measures up to 7 mm in the porta hepatis. Given the lack of intrahepatic biliary ductal dilatation, this is of doubtful clinical significance, as there is no frank biliary obstruction. No choledocholithiasis. 3. Colonic diverticulosis.  Electronically Signed: By: Vinnie Langton M.D. On: 12/25/2020 14:41 Note: Reviewed        Physical Exam  General appearance: Well nourished, well developed, and well hydrated. In no apparent acute distress Mental status: Alert, oriented x 3 (person, place, & time)       Respiratory: No evidence of acute respiratory distress Eyes: PERLA Vitals: BP (!) 146/81    Pulse 91    Temp (!) 97.1 F (36.2 C)    Resp 15    Ht _0  (1.6 m)    Wt 154 lb (69.9 kg)    SpO2 97%    BMI 27.28 kg/m  BMI: Estimated body mass index is 27.28 kg/m as calculated from the following:   Height as of this encounter: _1  (1.6 m).   Weight as of this encounter: 154 lb (69.9 kg). Ideal: Ideal body weight: 52.4 kg (115 lb 8.3 oz) Adjusted ideal body weight: 59.4 kg (130 lb 14.6 oz)  Assessment   Status Diagnosis  Controlled Controlled Controlled 1. Chronic pain syndrome   2. Chronic neck pain (1ry area of Pain) (Bilateral) (R>L)   3. Chronic upper back pain (2ry area of Pain) (Bilateral) (R>L)   4. Chronic low back pain (3ry area of Pain) (Bilateral) (R>L) w/o  sciatica   5. Cervical facet syndrome (Bilateral) (R>L)   6. Cervical spine pain   7. Cervicalgia   8. Chronic musculoskeletal pain   9. Chronic shoulder pain (Bilateral) (R>L)   10. Pharmacologic therapy   11. Chronic use of opiate for therapeutic purpose   12. Encounter for medication management   13. Encounter for chronic pain management      Updated Problems: No problems updated.  Plan of Care  Problem-specific:  No problem-specific Assessment & Plan notes found for this encounter.  Tanya [name] has a current medication list which includes the following long-term medication(s): albuterol, [START ON 02/06/2021] oxycodone, [START ON 03/08/2021] oxycodone, [START ON 04/07/2021] oxycodone, baclofen, and gabapentin.  Pharmacotherapy (Medications Ordered): Meds ordered this encounter  Medications   oxyCODONE (OXY IR/ROXICODONE) 5 MG immediate release tablet    Sig: Take 1 tablet (5 mg total) by mouth 2 (two) times daily. Must last 30 days    Dispense:  60 tablet    Refill:  0    DO NOT: delete (not duplicate); no partial-fill (will deny script to complete), no refill request (F/U required). DISPENSE: 1 day early if closed on fill date. WARN: No CNS-depressants within 8 hrs of med.   oxyCODONE (OXY IR/ROXICODONE) 5 MG immediate release tablet    Sig: Take 1 tablet (5 mg total) by mouth 2 (two) times daily. Must last 30 days    Dispense:  60 tablet    Refill:  0    DO NOT: delete (not duplicate); no partial-fill (will deny script to complete), no  refill request (F/U required). DISPENSE: 1 day early if closed on fill date. WARN: No CNS-depressants within 8 hrs of med.   oxyCODONE (OXY IR/ROXICODONE) 5 MG immediate release tablet    Sig: Take 1 tablet (5 mg total) by mouth 2 (two) times daily. Must last 30 days    Dispense:  60 tablet    Refill:  0    DO NOT: delete (not duplicate); no partial-fill (will deny script to complete), no refill request (F/U required). DISPENSE: 1  day early if closed on fill date. WARN: No CNS-depressants within 8 hrs of med.   Orders:  Orders Placed This Encounter  Procedures   ToxASSURE Select 13 (MW), Urine    Volume: 30 ml(s). Minimum 3 ml of urine is needed. Document temperature of fresh sample. Indications: Long term (current) use of opiate analgesic (S39.795)    Order Specific Question:   Release to patient    Answer:   Immediate   Follow-up plan:   Return in about 3 months (around 05/07/2021) for Eval-day (M,W), (F2F), (MM).     Interventional Therapies  Risk   Complexity Considerations:   WNL   Planned   Pending:   Therapeutic/palliative left cervical facet RFA #3 (last done 10/23/2017)    Under consideration:   Diagnostic right suprascapular NB  Diagnostic bilateral lumbar facet block    Completed:   Palliative right glenohumeral joint injection x2  Palliative right AC joint injection x3  Palliative left medial epicondyle elbow injection x1  Diagnostic left GONB x1  Palliative right CESI (Series x1)  Palliative bilateral cervical facet block  Palliative right cervical facet RFA x3 (04/13/2020) Palliative left cervical facet RFA x2 (10/23/2017)   Therapeutic   Palliative (PRN) options:   Palliative right glenohumeral joint injection #3  Palliative right AC joint injection #4  Palliative left medial epicondyle elbow injection #2  Diagnostic left GONB #2  Palliative right CESI (Series #2)  Palliative bilateral cervical facet block  Palliative right cervical facet RFA #4  Palliative left cervical facet RFA #3     Recent Visits No visits were found meeting these conditions. Showing recent visits within past 90 days and meeting all other requirements Today's Visits Date Type Provider Dept  01/31/21 Office Visit Milinda Pointer, MD [facility]  Showing today's visits and meeting all other requirements Future Appointments No visits were found meeting these conditions. Showing future  appointments within next 90 days and meeting all other requirements  I discussed the assessment and treatment plan with the patient. The patient was provided an opportunity to ask questions and all were answered. The patient agreed with the plan and demonstrated an understanding of the instructions.  Patient advised to call back or seek an in-person evaluation if the symptoms or condition worsens.  Duration of encounter: 30 minutes.  Note by: Gaspar Cola, MD Date: 01/31/2021; Time: 1:22 PM

## 2021-01-31 ENCOUNTER — Other Ambulatory Visit: Payer: Self-pay

## 2021-01-31 ENCOUNTER — Encounter: Payer: Self-pay | Admitting: Pain Medicine

## 2021-01-31 ENCOUNTER — Ambulatory Visit: Payer: Medicaid Other | Attending: Pain Medicine | Admitting: Pain Medicine

## 2021-01-31 VITALS — BP 146/81 | HR 91 | Temp 97.1°F | Resp 15 | Ht 63.0 in | Wt 154.0 lb

## 2021-01-31 DIAGNOSIS — G8929 Other chronic pain: Secondary | ICD-10-CM | POA: Diagnosis present

## 2021-01-31 DIAGNOSIS — M545 Low back pain, unspecified: Secondary | ICD-10-CM | POA: Diagnosis not present

## 2021-01-31 DIAGNOSIS — M47812 Spondylosis without myelopathy or radiculopathy, cervical region: Secondary | ICD-10-CM | POA: Diagnosis present

## 2021-01-31 DIAGNOSIS — M25511 Pain in right shoulder: Secondary | ICD-10-CM | POA: Diagnosis present

## 2021-01-31 DIAGNOSIS — Z79899 Other long term (current) drug therapy: Secondary | ICD-10-CM | POA: Diagnosis present

## 2021-01-31 DIAGNOSIS — M25512 Pain in left shoulder: Secondary | ICD-10-CM

## 2021-01-31 DIAGNOSIS — M542 Cervicalgia: Secondary | ICD-10-CM

## 2021-01-31 DIAGNOSIS — M549 Dorsalgia, unspecified: Secondary | ICD-10-CM | POA: Diagnosis not present

## 2021-01-31 DIAGNOSIS — G894 Chronic pain syndrome: Secondary | ICD-10-CM | POA: Diagnosis not present

## 2021-01-31 DIAGNOSIS — M7918 Myalgia, other site: Secondary | ICD-10-CM | POA: Diagnosis present

## 2021-01-31 DIAGNOSIS — Z79891 Long term (current) use of opiate analgesic: Secondary | ICD-10-CM

## 2021-01-31 MED ORDER — OXYCODONE HCL 5 MG PO TABS: 5.0000 mg | ORAL_TABLET | Freq: Two times a day (BID) | ORAL | 0 refills | Status: DC

## 2021-01-31 NOTE — Progress Notes (Signed)
Nursing Pain Medication Assessment:  Safety precautions to be maintained throughout the outpatient stay will include: orient to surroundings, keep bed in low position, maintain call bell within reach at all times, provide assistance with transfer out of bed and ambulation.  Medication Inspection Compliance: Pill count conducted under aseptic conditions, in front of the patient. Neither the pills nor the bottle was removed from the patient's sight at any time. Once count was completed pills were immediately returned to the patient in their original bottle.  Medication: Oxycodone IR Pill/Patch Count:  0 of 60 pills remain Pill/Patch Appearance: Markings consistent with prescribed medication Bottle Appearance: Standard pharmacy container. Clearly labeled. Filled Date: 22 / 18 / 2022 Last Medication intake:  TodaySafety precautions to be maintained throughout the outpatient stay will include: orient to surroundings, keep bed in low position, maintain call bell within reach at all times, provide assistance with transfer out of bed and ambulation.

## 2021-02-05 LAB — TOXASSURE SELECT 13 (MW), URINE

## 2021-02-06 ENCOUNTER — Telehealth: Payer: Self-pay | Admitting: Pain Medicine

## 2021-02-06 ENCOUNTER — Other Ambulatory Visit: Payer: Self-pay

## 2021-02-06 ENCOUNTER — Telehealth: Payer: Self-pay

## 2021-02-06 ENCOUNTER — Ambulatory Visit (INDEPENDENT_AMBULATORY_CARE_PROVIDER_SITE_OTHER): Payer: Medicaid Other | Admitting: Psychiatry

## 2021-02-06 ENCOUNTER — Encounter: Payer: Self-pay | Admitting: Psychiatry

## 2021-02-06 ENCOUNTER — Telehealth: Payer: Self-pay | Admitting: *Deleted

## 2021-02-06 VITALS — BP 135/87 | HR 98 | Temp 97.9°F | Ht 63.0 in | Wt 148.6 lb

## 2021-02-06 DIAGNOSIS — Z9189 Other specified personal risk factors, not elsewhere classified: Secondary | ICD-10-CM | POA: Diagnosis not present

## 2021-02-06 DIAGNOSIS — F331 Major depressive disorder, recurrent, moderate: Secondary | ICD-10-CM | POA: Diagnosis not present

## 2021-02-06 DIAGNOSIS — G4700 Insomnia, unspecified: Secondary | ICD-10-CM | POA: Diagnosis not present

## 2021-02-06 DIAGNOSIS — F411 Generalized anxiety disorder: Secondary | ICD-10-CM | POA: Diagnosis not present

## 2021-02-06 MED ORDER — BUSPIRONE HCL 15 MG PO TABS: 15.0000 mg | ORAL_TABLET | Freq: Three times a day (TID) | ORAL | 1 refills | Status: DC

## 2021-02-06 MED ORDER — ZOLPIDEM TARTRATE 5 MG PO TABS: 5.0000 mg | ORAL_TABLET | Freq: Every evening | ORAL | 0 refills | Status: DC | PRN

## 2021-02-06 NOTE — Telephone Encounter (Signed)
Medication management - Prior authorization for patient's prescribed Zolpidem Tartrate 5 mg, 1 at bedtime as needed, #30 for 30 days completed online with CoverMyMeds and decision pending.
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
## 2021-02-06 NOTE — Telephone Encounter (Signed)
Patient called stating Dr. Shea Evans wants to start her on Ambien for sleep. She has stopped the Valium. Dr. Shea Evans said she had to clear with Dr. Dossie Arbour first. Please advise patient.

## 2021-02-06 NOTE — Telephone Encounter (Signed)
Bubble to FN

## 2021-02-06 NOTE — Patient Instructions (Signed)
Zolpidem Tablets What is this medication? ZOLPIDEM (zole PI dem) treats insomnia. It helps you get to sleep faster and stay asleep throughout the night. It is often used for a short period of time. This medicine may be used for other purposes; ask your health care provider or pharmacist if you have questions. COMMON BRAND NAME(S): Ambien What should I tell my care team before I take this medication? They need to know if you have any of these conditions: Depression History of drug abuse or addiction If you often drink alcohol Liver disease Lung or breathing disease Myasthenia gravis Sleep apnea Sleep-walking, driving, eating or other activity while not fully awake after taking a sleep medication Suicidal thoughts, plans, or attempt; a previous suicide attempt by you or a family member An unusual or allergic reaction to zolpidem, other medications, foods, dyes, or preservatives Pregnant or trying to get pregnant Breast-feeding How should I use this medication? Take this medication by mouth with a glass of water. Follow the directions on the prescription label. It is better to take this medication on an empty stomach and only when you are ready for bed. Do not take your medication more often than directed. If you have been taking this medication for several weeks and suddenly stop taking it, you may get unpleasant withdrawal symptoms. Your care team may want to gradually reduce the dose. Do not stop taking this medication on your own. Always follow your care team's advice. A special MedGuide will be given to you by the pharmacist with each prescription and refill. Be sure to read this information carefully each time. Talk to your care team regarding the use of this medication in children. Special care may be needed. Overdosage: If you think you have taken too much of this medicine contact a poison control center or emergency room at once. NOTE: This medicine is only for you. Do not share this  medicine with others. What if I miss a dose? This does not apply. This medication should only be taken immediately before going to sleep. Do not take double or extra doses. What may interact with this medication? Alcohol Antihistamines for allergy, cough and cold Certain medications for anxiety or sleep Certain medications for depression, like amitriptyline, fluoxetine, sertraline Certain medications for fungal infections like ketoconazole and itraconazole Certain medications for seizures like phenobarbital, primidone Ciprofloxacin Dietary supplements for sleep, like valerian or kava kava General anesthetics like halothane, isoflurane, methoxyflurane, propofol Local anesthetics like lidocaine, pramoxine, tetracaine Medications that relax muscles for surgery Narcotic medications for pain Phenothiazines like chlorpromazine, mesoridazine, prochlorperazine, thioridazine Rifampin This list may not describe all possible interactions. Give your health care provider a list of all the medicines, herbs, non-prescription drugs, or dietary supplements you use. Also tell them if you smoke, drink alcohol, or use illegal drugs. Some items may interact with your medicine. What should I watch for while using this medication? Visit your care team for regular checks on your progress. Keep a regular sleep schedule by going to bed at about the same time each night. Avoid caffeine-containing drinks in the evening hours. When sleep medications are used every night for more than a few weeks, they may stop working. Talk to your care team if you still have trouble sleeping. After taking this medication, you may get up out of bed and do an activity that you do not know you are doing. The next morning, you may have no memory of this. Activities include driving a car ("sleep-driving"), making and eating food,  talking on the phone, sexual activity, and sleep-walking. Serious injuries have occurred. Stop the medication and  call your care team right away if you find out you have done any of these activities. Do not take this medication if you have used alcohol that evening. Do not take it if you have taken another medication for sleep. The risk of doing these sleep-related activities is higher. Wait for at least 8 hours after you take a dose before driving or doing other activities that require full mental alertness. Do not take this medication unless you are able to stay in bed for a full night (7 to 8 hours) before you must be active again. You may have a decrease in mental alertness the day after use, even if you feel that you are fully awake. Tell your care team if you will need to perform activities requiring full alertness, such as driving, the next day. Do not stand or sit up quickly after taking this medication, especially if you are an older patient. This reduces the risk of dizzy or fainting spells. If you or your family notice any changes in your behavior, such as new or worsening depression, thoughts of harming yourself, anxiety, other unusual or disturbing thoughts, or memory loss, call your care team right away. After you stop taking this medication, you may have trouble falling asleep. This is called rebound insomnia. This problem usually goes away on its own after 1 or 2 nights. What side effects may I notice from receiving this medication? Side effects that you should report to your care team as soon as possible: Allergic reactions--skin rash, itching, hives, swelling of the face, lips, tongue, or throat Change in vision such as blurry vision, seeing halos around lights, vision loss CNS depression--slow or shallow breathing, shortness of breath, feeling faint, dizziness, confusion, difficulty staying awake Mood and behavior changes--anxiety, nervousness, confusion, hallucinations, irritability, hostility, thoughts of suicide or self-harm, worsening mood, feelings of depression Unusual sleep behaviors or  activities you do not remember such as driving, eating, or sexual activity Side effects that usually do not require medical attention (report to your care team if they continue or are bothersome): Diarrhea Dizziness Drowsiness the day after use Headache This list may not describe all possible side effects. Call your doctor for medical advice about side effects. You may report side effects to FDA at 1-800-FDA-1088. Where should I keep my medication? Keep out of the reach of children and pets. This medication can be abused. Keep your medication in a safe place to protect it from theft. Do not share this medication with anyone. Selling or giving away this medication is dangerous and against the law. Store at room temperature between 20 and 25 degrees C (68 and 77 degrees F). This medication may cause accidental overdose and death if taken by other adults, children, or pets. Mix any unused medication with a substance like cat litter or coffee grounds. Then throw the medication away in a sealed container like a sealed bag or a coffee can with a lid. Do not use the medication after the expiration date. NOTE: This sheet is a summary. It may not cover all possible information. If you have questions about this medicine, talk to your doctor, pharmacist, or health care provider.  2022 Elsevier/Gold Standard (2020-02-04 00:00:00)

## 2021-02-06 NOTE — Telephone Encounter (Signed)
Medication management - Telephone call with pharmacy technician at [facility] to inform patient's Zolpidem Tartrate 5 mg tablet was approved this date until 08/05/21.  Copy of approval from [facility] sent to scan.

## 2021-02-06 NOTE — Telephone Encounter (Signed)
Patient called stating Dr Shea Evans would like to prescribe ambien.  He would like to make sure that [facility] is okay with him doing that.    Message sent to [facility].  He responded that patient should not take within 4 hours of  pain medication. Patient verbalizes u/o information.

## 2021-02-07 ENCOUNTER — Other Ambulatory Visit
Admission: RE | Admit: 2021-02-07 | Discharge: 2021-02-07 | Disposition: A | Discharge status: Home / Self Care | Payer: Medicaid Other | Source: Ambulatory Visit | Attending: Psychiatry | Admitting: Psychiatry

## 2021-02-07 ENCOUNTER — Ambulatory Visit: Payer: Medicaid Other | Admitting: Psychiatry

## 2021-02-07 ENCOUNTER — Ambulatory Visit
Admission: RE | Admit: 2021-02-07 | Discharge: 2021-02-07 | Disposition: A | Discharge status: Home / Self Care | Payer: Medicaid Other | Source: Ambulatory Visit | Attending: Psychiatry | Admitting: Psychiatry

## 2021-02-07 DIAGNOSIS — F411 Generalized anxiety disorder: Secondary | ICD-10-CM | POA: Diagnosis not present

## 2021-02-07 DIAGNOSIS — F331 Major depressive disorder, recurrent, moderate: Secondary | ICD-10-CM | POA: Insufficient documentation

## 2021-02-07 DIAGNOSIS — R9431 Abnormal electrocardiogram [ECG] [EKG]: Secondary | ICD-10-CM | POA: Diagnosis not present

## 2021-02-07 DIAGNOSIS — Z9189 Other specified personal risk factors, not elsewhere classified: Secondary | ICD-10-CM | POA: Insufficient documentation

## 2021-02-07 DIAGNOSIS — G4700 Insomnia, unspecified: Secondary | ICD-10-CM | POA: Insufficient documentation

## 2021-02-07 LAB — TSH: TSH: 3.347 u[IU]/mL (ref 0.350–4.500)

## 2021-02-26 ENCOUNTER — Encounter: Payer: Self-pay | Admitting: Primary Care

## 2021-02-26 ENCOUNTER — Other Ambulatory Visit: Payer: Self-pay

## 2021-02-26 ENCOUNTER — Encounter: Payer: Medicaid Other | Admitting: Acute Care

## 2021-02-26 ENCOUNTER — Ambulatory Visit (INDEPENDENT_AMBULATORY_CARE_PROVIDER_SITE_OTHER): Payer: Medicaid Other | Admitting: Primary Care

## 2021-02-26 DIAGNOSIS — F17210 Nicotine dependence, cigarettes, uncomplicated: Secondary | ICD-10-CM | POA: Diagnosis not present

## 2021-02-26 DIAGNOSIS — F17200 Nicotine dependence, unspecified, uncomplicated: Secondary | ICD-10-CM

## 2021-02-26 NOTE — Progress Notes (Signed)
Virtual Visit via Telephone Note  I connected with Tanya [name] on 02/26/21 at  2:00 PM EST by telephone and verified that I am speaking with the correct person using two identifiers.  Location: Patient: Home Provider: Office   I discussed the limitations, risks, security and privacy concerns of performing an evaluation and management service by telephone and the availability of in person appointments. I also discussed with the patient that there may be a patient responsible charge related to this service. The patient expressed understanding and agreed to proceed.   Shared Decision Making Visit Lung Cancer Screening Program [phone])   Eligibility: Age 61 y.o. Pack Years Smoking History Calculation 44 (# packs/per year x # years smoked) Recent History of coughing up blood  no Unexplained weight loss? no ( >Than 15 pounds within the last 6 months ) Prior History Lung / other cancer no (Diagnosis within the last 5 years already requiring surveillance chest CT Scans). Smoking Status Current Smoker Former Smokers: Years since quit: < 1 year  Quit Date: NA  Visit Components: Discussion included one or more decision making aids. yes Discussion included risk/benefits of screening. yes Discussion included potential follow up diagnostic testing for abnormal scans. yes Discussion included meaning and risk of over diagnosis. yes Discussion included meaning and risk of False Positives. yes Discussion included meaning of total radiation exposure. yes  Counseling Included: Importance of adherence to annual lung cancer LDCT screening. yes Impact of comorbidities on ability to participate in the program. yes Ability and willingness to under diagnostic treatment. yes  Smoking Cessation Counseling: Current Smokers:  Discussed importance of smoking cessation. yes Information about tobacco cessation classes and interventions provided to patient. yes Patient provided with "ticket" for  LDCT Scan. NA Symptomatic Patient. no  Counseling(Intermediate counseling: > three minutes) 99406 Diagnosis Code: Tobacco Use Z72.0 Asymptomatic Patient yes  Counseling (Intermediate counseling: > three minutes counseling) D6644 Former Smokers:  Discussed the importance of maintaining cigarette abstinence. yes Diagnosis Code: Personal History of Nicotine Dependence. I34.742 Information about tobacco cessation classes and interventions provided to patient. Yes Patient provided with "ticket" for LDCT Scan. NA Written Order for Lung Cancer Screening with LDCT placed in Epic. Yes (CT Chest Lung Cancer Screening Low Dose W/O CM) [id number] Z12.2-Screening of respiratory organs Z87.891-Personal history of nicotine dependence  I have spent 25 minutes of face to face/ virtual visit   time with [name] discussing the risks and benefits of lung cancer screening. We viewed / discussed a power point together that explained in detail the above noted topics. We paused at intervals to allow for questions to be asked and answered to ensure understanding.We discussed that the single most powerful action that she can take to decrease her risk of developing lung cancer is to quit smoking. We discussed whether or not she is ready to commit to setting a quit date. We discussed options for tools to aid in quitting smoking including nicotine replacement therapy, non-nicotine medications, support groups, Quit Smart classes, and behavior modification. We discussed that often times setting smaller, more achievable goals, such as eliminating 1 cigarette a day for a week and then 2 cigarettes a day for a week can be helpful in slowly decreasing the number of cigarettes smoked. This allows for a sense of accomplishment as well as providing a clinical benefit. I provided her with smoking cessation  information  with contact information for community resources, classes, free nicotine replacement therapy, and access to mobile  apps, text messaging, and  on-line smoking cessation help. I have also provided her the office contact information in the event she needs to contact me, or the screening staff. We discussed the time and location of the scan, and that either Doroteo Glassman RN, Joella Prince, RN  or I will call / send a letter with the results within 24-72 hours of receiving them. The patient verbalized understanding of all of  the above and had no further questions upon leaving the office. They have my contact information in the event they have any further questions.  I spent 3-5 minutes counseling on smoking cessation and the health risks of continued tobacco abuse.  I explained to the patient that there has been a high incidence of coronary artery disease noted on these exams. I explained that this is a non-gated exam therefore degree or severity cannot be determined. This patient is on statin therapy. I have asked the patient to follow-up with their PCP regarding any incidental finding of coronary artery disease and management with diet or medication as their PCP  feels is clinically indicated. The patient verbalized understanding of the above and had no further questions upon completion of the visit.    Martyn Ehrich, NP

## 2021-02-26 NOTE — Patient Instructions (Signed)
Thank you for participating in the [facility] Scre[facility]. °It was our pleasure to meet you today. °We will call you with the results of your scan within the next few days. °Your scan will be assigned a Lung RADS category score by the physicians reading the scans.  °This Lung RADS score determines follow up scanning.  °See below for description of categories, and follow up screening recommendations. °We will be in touch to schedule your follow up screening annually or based on recommendations of our providers. °We will fax a copy of your scan results to your Primary Care Physician, or the physician who referred you to the program, to ensure they have the results. °Please call the office if you have any questions or concerns regarding your scanning experience or results.  °Our office number is [phone]. °Please speak with Denise Phelps, RN. She is our Lung Cancer Screening RN. °If she is unavailable when you call, please have the office staff send her a message. She will return your call at her earliest convenience. °Remember, if your scan is normal, we will scan you annually as long as you continue to meet the criteria for the program. (Age 55-77, Current smoker or smoker who has quit within the last 15 years). °If you are a smoker, remember, quitting is the single most powerful action that you can take to decrease your risk of lung cancer and other pulmonary, breathing related problems. °We know quitting is hard, and we are here to help.  °Please let us know if there is anything we can do to help you meet your goal of quitting. °If you are a former smoker, congratulations. We are proud of you! Remain smoke free! °Remember you can refer friends or family members through the number above.  °We will screen them to make sure they meet criteria for the program. °Thank you for helping us take better care of you by participating in Lung Screening. ° °You can receive free nicotine replacement therapy  ( patches, gum or mints) by calling 1-800-QUIT NOW. Please call so we can get you on the path to becoming  a non-smoker. I know it is hard, but you can do this! ° °Lung RADS Categories: ° °Lung RADS 1: no nodules or definitely non-concerning nodules.  °Recommendation is for a repeat annual scan in 12 months. ° °Lung RADS 2:  nodules that are non-concerning in appearance and behavior with a very low likelihood of becoming an active cancer. °Recommendation is for a repeat annual scan in 12 months. ° °Lung RADS 3: nodules that are probably non-concerning , includes nodules with a low likelihood of becoming an active cancer.  Recommendation is for a 6-month repeat screening scan. Often noted after an upper respiratory illness. We will be in touch to make sure you have no questions, and to schedule your 6-month scan. ° °Lung RADS 4 A: nodules with concerning findings, recommendation is most often for a follow up scan in 3 months or additional testing based on our provider's assessment of the scan. We will be in touch to make sure you have no questions and to schedule the recommended 3 month follow up scan. ° °Lung RADS 4 B:  indicates findings that are concerning. We will be in touch with you to schedule additional diagnostic testing based on our provider's  assessment of the scan. ° °Hypnosis for smoking cessation  °[employer] °[phone] ° °Acupuncture for smoking cessation  °[facility] °[phone]  °

## 2021-02-27 ENCOUNTER — Ambulatory Visit
Admission: RE | Admit: 2021-02-27 | Discharge: 2021-02-27 | Disposition: A | Discharge status: Home / Self Care | Payer: Medicaid Other | Source: Ambulatory Visit | Attending: Acute Care | Admitting: Acute Care

## 2021-02-27 ENCOUNTER — Other Ambulatory Visit: Payer: Self-pay

## 2021-02-27 DIAGNOSIS — F17210 Nicotine dependence, cigarettes, uncomplicated: Secondary | ICD-10-CM | POA: Diagnosis not present

## 2021-02-27 DIAGNOSIS — Z87891 Personal history of nicotine dependence: Secondary | ICD-10-CM | POA: Diagnosis present

## 2021-02-28 ENCOUNTER — Other Ambulatory Visit: Payer: Self-pay | Admitting: Acute Care

## 2021-02-28 ENCOUNTER — Telehealth: Payer: Self-pay | Admitting: Primary Care

## 2021-02-28 DIAGNOSIS — R911 Solitary pulmonary nodule: Secondary | ICD-10-CM

## 2021-02-28 NOTE — Telephone Encounter (Signed)
Received call report from [facility] Radiology on LDCT done 02/27/21   Impression:  IMPRESSION: 1. Multiple part solid nodules are seen in the left upper lobe, largest measures up to 23.4 mm with 19.9 mm solid component. Lung-RADS 4BS, suspicious. Additional imaging evaluation or consultation with Pulmonology or Thoracic Surgery recommended. S modifier for coronary artery calcifications and respiratory bronchiolitis. 2. Ill-defined centrilobular ground-glass nodules primarily in the upper lungs, findings are compatible with smoking related respiratory bronchiolitis. 3. 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 4. Aortic Atherosclerosis (ICD10-I70.0) and Emphysema (ICD10-J43.9).   These results will be called to the ordering clinician or representative by the Radiologist Assistant, and communication documented in the PACS or [employer].     Electronically Signed   By: Yetta Glassman M.D.   On: 02/28/2021 11:59    Forwarding to Judson Roch to be called through screening program.

## 2021-02-28 NOTE — Telephone Encounter (Addendum)
Appt 03/01/2021 at 4:30 with Dr. Patsey Berthold.  Patient is aware and voiced her understanding.

## 2021-02-28 NOTE — Telephone Encounter (Signed)
I have called the patient with the results of the low dose CT Chest. I explained that her scan is abnormal, and that there is a pulmonary nodule that needs closer evaluation. I explained that it is 2.34 cm in the LUL. I explained that I have reviewed the scan with Dr. Patsey Berthold and that the best option is for additional imaging. We will order PET scan and PFT's , and we will have the patient follow up with Dr. Patsey Berthold in the [place] office once the scan and PFT have been completed for review and planning of next diagnostic steps.  Patient is in agreement with this plan. She verbalized understanding. She had no further questions at completion of the phone call.  Langley Gauss, please fax results to PCP and let them know plan is for PFT's and PET and follow up with Dr. Patsey Berthold at  [facility] office for planning . Let them know we will keep them in the loop.

## 2021-02-28 NOTE — Telephone Encounter (Signed)
Ruby calling with call report CT scan. Ruby phone number is [phone].

## 2021-03-01 ENCOUNTER — Encounter: Payer: Self-pay | Admitting: Pulmonary Disease

## 2021-03-01 ENCOUNTER — Telehealth: Payer: Self-pay

## 2021-03-01 ENCOUNTER — Other Ambulatory Visit: Payer: Self-pay

## 2021-03-01 ENCOUNTER — Ambulatory Visit (INDEPENDENT_AMBULATORY_CARE_PROVIDER_SITE_OTHER): Payer: Medicaid Other | Admitting: Pulmonary Disease

## 2021-03-01 VITALS — BP 102/64 | HR 96 | Temp 98.2°F | Ht 63.0 in | Wt 151.2 lb

## 2021-03-01 DIAGNOSIS — R911 Solitary pulmonary nodule: Secondary | ICD-10-CM | POA: Diagnosis not present

## 2021-03-01 DIAGNOSIS — F17200 Nicotine dependence, unspecified, uncomplicated: Secondary | ICD-10-CM | POA: Diagnosis not present

## 2021-03-01 DIAGNOSIS — M329 Systemic lupus erythematosus, unspecified: Secondary | ICD-10-CM | POA: Diagnosis not present

## 2021-03-01 NOTE — Patient Instructions (Addendum)
We are going to schedule your procedure for 22 February.  Usually the procedure starts around 12:30 but you will get instructions as to when to arrive.  You will also be getting a call from the preadmission clinic to give you more instructions.  You will need a special CT of the chest so that we can map the path to the spot in your lung.  Try to get this on the same day of your PET/CT.  We will see you in follow-up in 4 to 6 weeks time however, we will be in touch with you after the procedure to communicate any findings from the same.

## 2021-03-01 NOTE — Telephone Encounter (Signed)
Results of CT with plan recommendations faxes to PCP

## 2021-03-01 NOTE — Telephone Encounter (Signed)
Robotic Bronchoscopy  w/ navigation Scheduled with [employer] on Feb 22nd at 12:20. CPT code [phone], [phone]. DX left upper lobe lung nodule. Rodena Piety please see Bronch information.

## 2021-03-01 NOTE — H&P (View-Only) (Signed)
Subjective:    Patient ID: Tanya [name], female    DOB: Jul 30, 1960, 61 y.o.   MRN: [id number] Chief Complaint  Patient presents with   Consult    Abnormal lung cancer screening CT   HPI Tanya [name] is a 61 year old current smoker (1 PPD) she has noted below, who presents for evaluation of an abnormal low-dose CT chest for lung cancer screening.  She is kindly referred by Dr. Adrian Prows.  Patient has been asymptomatic with regards to this finding.  She does not describe any dyspnea over baseline, cough is unchanged from her baseline which is usually mornings usually productive of whitish to grayish sputum.  No hemoptysis.  No purulent sputum production.  Patient states she has been smoking since age 52 now up to 1 pack of cigarettes per day.  She has issues with arthralgias and myalgias as well as skin manifestations from lupus.  She is being followed by Dr. Posey Pronto at [facility] for her lupus issues.  She is on Plaquenil for this.  Patient did have COVID-19 in May 2021 but no significant sequela after that.  She is on disability due to her lupus.  She is not limited on her activities of daily living at home.  As noted dyspnea is not a salient symptom.  She does note dyspnea occasionally on heavy exertion.  She has not had any orthopnea, paroxysmal nocturnal dyspnea or lower extremity edema.  No chest pain.  No fevers, chills or sweats.  No calf tenderness.  No gastroesophageal reflux symptoms.   Review of Systems A 10 point review of systems was performed and it is as noted above otherwise negative.  Past Medical History:  Diagnosis Date   Anxiety    Bladder infection    8/18   Chronic lower back pain    Chronic neck pain    Collagen vascular disease ([place])    Depression    Diverticulitis    Hyperlipidemia    Lupus ([place])    Overactive bladder    Past Surgical History:  Procedure Laterality Date   ABDOMINAL HYSTERECTOMY     COLONOSCOPY     COLONOSCOPY WITH PROPOFOL N/A  07/03/2017   Procedure: COLONOSCOPY WITH PROPOFOL;  Surgeon: [name], MD;  Location: [facility] ENDOSCOPY;  Service: Endoscopy;  Laterality: N/A;   COLONOSCOPY WITH PROPOFOL N/A 10/21/2017   Procedure: COLONOSCOPY WITH PROPOFOL;  Surgeon: [name], MD;  Location: [facility] ENDOSCOPY;  Service: Endoscopy;  Laterality: N/A;   ESOPHAGOGASTRODUODENOSCOPY N/A 10/21/2017   Procedure: ESOPHAGOGASTRODUODENOSCOPY (EGD);  Surgeon: [name], MD;  Location: [facility] ENDOSCOPY;  Service: Endoscopy;  Laterality: N/A;   TONSILLECTOMY     Patient Active Problem List   Diagnosis Date Noted   Insomnia 02/06/2021   At risk for prolonged QT interval syndrome 02/06/2021   Psychiatric illness 11/01/2020   Chronic use of opiate for therapeutic purpose 06/25/2020   Arthropathy of cervical facet joint (Bilateral) 04/13/2020   Cervical spine pain 04/13/2020   Abnormal MRI, cervical spine (09/20/2015) 04/13/2020   Cervical central spinal stenosis (Multilevel) 04/13/2020   Cervical foraminal stenosis (Right: C3-4) (Bilateral: C4-5, C5-6) 04/13/2020   DDD (degenerative disc disease), cervical 03/14/2020   Chronic shoulder pain (Left) 09/06/2019   Osteoarthritis of shoulder (Left) 09/06/2019   Osteoarthritis of shoulders (Bilateral) (R>L) 10/25/2018   Osteoarthritis of AC (acromioclavicular) joint (Right) 04/02/2018   Chronic acromioclavicular joint pain (Right) 04/02/2018   Chronic shoulder pain (Right) 04/02/2018   Epicondylitis elbow, medial (Left) 04/02/2018  Chronic elbow pain (Left) 03/25/2018   Occipital headache (Left) 12/10/2017   Neurogenic pain 12/10/2017   Cervico-occipital neuralgia (Left) 12/10/2017   Pharmacologic therapy 09/01/2017   Disorder of skeletal system 09/01/2017   Problems influencing health status 09/01/2017   Spondylosis without myelopathy or radiculopathy, cervical region 08/05/2017   Cervicalgia 08/05/2017   Rash 05/13/2017   Acute postoperative pain 06/26/2016    Cervical spondylosis 04/15/2016   Cervical facet syndrome (Bilateral) (R>L) 04/15/2016   Shoulder radicular pain (Bilateral) (R>L) 04/15/2016   Chronic musculoskeletal pain 03/07/2016   Vitamin D insufficiency 12/19/2015   Anxiety 11/23/2015   Chronic low back pain (3ry area of Pain) (Bilateral) (R>L) w/o sciatica 11/23/2015   Chronic neck pain (1ry area of Pain) (Bilateral) (R>L) 11/23/2015   Long term prescription benzodiazepine use 11/23/2015   Long term current use of opiate analgesic 11/23/2015   Long term prescription opiate use 11/23/2015   Opiate use (10 MME/Day) 11/23/2015   Chronic pain syndrome 11/23/2015   Chronic upper extremity pain (Right) 11/23/2015   Chronic cervical radicular pain (Right) 11/23/2015   Chronic shoulder pain (Bilateral) (R>L) 11/23/2015   Hypercholesterolemia 06/20/2015   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   Encounter for long-term (current) use of high-risk medication 06/06/2015   Pain medication agreement signed 11/24/2014   Hyperlipidemia 01/25/2014   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   Diverticulitis 10/04/2012   Osteoarthritis 10/04/2012   Chronic upper back pain (2ry area of Pain) (Bilateral) (R>L) 08/17/2012   Urinary incontinence 08/17/2012   Fibromyalgia 06/04/2012   SLE (systemic lupus erythematosus) ([place]) 02/18/2012   Generalized anxiety disorder 01/07/2012   Major depressive disorder, recurrent ([place]) 09/29/2009   Family History  Problem Relation Age of Onset   Depression Mother    Alcohol abuse Mother    Cancer Mother    Hypertension Mother    Emphysema Mother    Stroke Mother    Glaucoma Father    Heart disease Father    Diabetes Brother    Drug abuse Son    Social History   Tobacco Use   Smoking status: Every Day    Packs/day: 1.50    Years: 35.00    Pack years: 52.50    Types: Cigarettes   Smokeless tobacco: Never   Tobacco comments:    1ppd - 03/01/2021  Substance Use Topics   Alcohol use: No   Allergies   Allergen Reactions   Pregabalin Other (See Comments)   Trazodone Other (See Comments)    Agitation   Bupropion Anxiety   Methocarbamol Other (See Comments)    Agitation and stiffness   Tramadol Other (See Comments)    Other reaction(s): Nausea And Vomiting Reports she feels "high"   Current Meds  Medication Sig   atorvastatin (LIPITOR) 80 MG tablet Take 80 mg by mouth daily.   busPIRone (BUSPAR) 15 MG tablet Take 1 tablet (15 mg total) by mouth 3 (three) times daily. Take daily 1 tablet at 8 AM, 1 tablet at 1 PM and 1 tablet at 5PM   diazepam (VALIUM) 5 MG tablet Take 5 mg by mouth at bedtime.   DULoxetine (CYMBALTA) 30 MG capsule Take 30 mg by mouth daily.   hydroxychloroquine (PLAQUENIL) 200 MG tablet Take 200 mg by mouth 2 (two) times daily.   oxyCODONE (OXY IR/ROXICODONE) 5 MG immediate release tablet Take 1 tablet (5 mg total) by mouth 2 (two) times daily. Must last 30 days   [START ON 03/08/2021] oxyCODONE (OXY IR/ROXICODONE) 5 MG immediate release tablet  Take 1 tablet (5 mg total) by mouth 2 (two) times daily. Must last 30 days   [START ON 04/07/2021] oxyCODONE (OXY IR/ROXICODONE) 5 MG immediate release tablet Take 1 tablet (5 mg total) by mouth 2 (two) times daily. Must last 30 days   pantoprazole (PROTONIX) 40 MG tablet Take by mouth.   Immunization History  Administered Date(s) Administered   PFIZER Comirnaty(Gray Top)Covid-19 Tri-Sucrose Vaccine 08/03/2019, 08/24/2019      Objective:   Physical Exam BP 102/64 (BP Location: Left Arm, Patient Position: Sitting, Cuff Size: Normal)    Pulse 96    Temp 98.2 F (36.8 C) (Oral)    Ht 5\' 3"  (1.6 m)    Wt 151 lb 3.2 oz (68.6 kg)    SpO2 99%    BMI 26.78 kg/m  GENERAL: Well-developed, well-nourished woman, no acute distress. HEAD: Normocephalic, atraumatic.  EYES: Pupils equal, round, reactive to light.  No scleral icterus.  MOUTH: Nose/mouth/throat not examined due to masking requirements for COVID 19. NECK: Supple. No  thyromegaly. Trachea midline. No JVD.  No adenopathy. PULMONARY: Good air entry bilaterally.  No adventitious sounds. CARDIOVASCULAR: S1 and S2. Regular rate and rhythm.  No rubs, murmurs or gallops heard. ABDOMEN: Benign. MUSCULOSKELETAL: No joint deformity, no clubbing, no edema.  NEUROLOGIC: No focal deficit, no gait disturbance, speech is fluent. SKIN: Intact,warm,dry. PSYCH: Mood and behavior normal.   Representative image from LDCT performed in February 2023:      Assessment & Plan:     ICD-10-CM   1. Nodule of upper lobe of left lung - 23.4 mm X 19.9 mm   R91.1 CT SUPER D CHEST WO [facility] PILOT   Nodule is part solid Cluster of smaller nodules surrounding Recommend biopsy with robotic bronchoscopy Rule out cancer versus inflammatory process    2. Lupus ([place])  M32.9    Described as chronic discoid lupus She does have some systemic symptoms On Plaquenil This issue adds complexity to her management    3. Current smoker  F17.200    Patient counseled regards to discontinuation of smoking Total counseling time 3 to 5 minutes     Orders Placed This Encounter  Procedures   CT SUPER D CHEST WO [facility] PILOT    [facility] Protocol Arms Down  Inspiratory imaging.  Slice Thickness 9.92 Slice Interval 0.5    Standing Status:   Future    Standing Expiration Date:   03/01/2022    Scheduling Instructions:     Feb 17th    Order Specific Question:   Is patient pregnant?    Answer:   No    Order Specific Question:   Preferred imaging location?    Answer:   [facility]   Benefits, limitations and potential complications of the procedure (robotic assisted bronchoscopy) were discussed with the patient/family.  Complications from bronchoscopy are rare and most often minor, but if they occur they may include breathing difficulty, vocal cord spasm, hoarseness, slight fever, vomiting, dizziness, bronchospasm, infection, low blood oxygen, bleeding from biopsy site, or an allergic  reaction to medications.  It is uncommon for patients to experience other more serious complications for example: Collapsed lung requiring chest tube placement, respiratory failure, heart attack and/or cardiac arrhythmia.  Patient agrees to proceed.  Will procure [facility] protocol CT on same day that her PET/CT is scheduled for.    For the purposes of robotic assisted bronchoscopy the ventilation strategy will be as follows: Tidal volume 370 (7 mL/kg PBW) PEEP of 8-12.  We  will see the patient in follow-up in 4 to 6 weeks time.  She is to contact us prior to that time should any new difficulties arise.   Renold Don, MD Advanced Bronchoscopy PCCM [facility]    *This note was dictated using voice recognition software/Dragon.  Despite best efforts to proofread, errors can occur which can change the meaning. Any transcriptional errors that result from this process are unintentional and may not be fully corrected at the time of dictation.

## 2021-03-01 NOTE — Telephone Encounter (Signed)
Per Dr. Nestor Ramp to see patient prior to PET.

## 2021-03-01 NOTE — Progress Notes (Signed)
Subjective:    Patient ID: Tanya [name], female    DOB: [birth year], 61 y.o.   MRN: [id number] Chief Complaint  Patient presents with   Consult    Abnormal lung cancer screening CT   HPI Tanya [name] is a 61 year old current smoker (1 PPD) she has noted below, who presents for evaluation of an abnormal low-dose CT chest for lung cancer screening.  She is kindly referred by Dr. Adrian Prows.  Patient has been asymptomatic with regards to this finding.  She does not describe any dyspnea over baseline, cough is unchanged from her baseline which is usually mornings usually productive of whitish to grayish sputum.  No hemoptysis.  No purulent sputum production.  Patient states she has been smoking since age 49 now up to 1 pack of cigarettes per day.  She has issues with arthralgias and myalgias as well as skin manifestations from lupus.  She is being followed by Dr. Posey Pronto at [facility] for her lupus issues.  She is on Plaquenil for this.  Patient did have COVID-19 in May 2021 but no significant sequela after that.  She is on disability due to her lupus.  She is not limited on her activities of daily living at home.  As noted dyspnea is not a salient symptom.  She does note dyspnea occasionally on heavy exertion.  She has not had any orthopnea, paroxysmal nocturnal dyspnea or lower extremity edema.  No chest pain.  No fevers, chills or sweats.  No calf tenderness.  No gastroesophageal reflux symptoms.   Review of Systems A 10 point review of systems was performed and it is as noted above otherwise negative.  Past Medical History:  Diagnosis Date   Anxiety    Bladder infection    8/18   Chronic lower back pain    Chronic neck pain    Collagen vascular disease ([place])    Depression    Diverticulitis    Hyperlipidemia    Lupus ([place])    Overactive bladder    Past Surgical History:  Procedure Laterality Date   ABDOMINAL HYSTERECTOMY     COLONOSCOPY     COLONOSCOPY WITH PROPOFOL N/A  07/03/2017   Procedure: COLONOSCOPY WITH PROPOFOL;  Surgeon: [name], MD;  Location: [facility] ENDOSCOPY;  Service: Endoscopy;  Laterality: N/A;   COLONOSCOPY WITH PROPOFOL N/A 10/21/2017   Procedure: COLONOSCOPY WITH PROPOFOL;  Surgeon: [name], MD;  Location[facility] ENDOSCOPY;  Service: Endoscopy;  Laterality: N/A;   ESOPHAGOGASTRODUODENOSCOPY N/A 10/21/2017   Procedure: ESOPHAGOGASTRODUODENOSCOPY (EGD);  Surgeon: [name], MD;  Location: [facility] ENDOSCOPY;  Service: Endoscopy;  Laterality: N/A;   TONSILLECTOMY     Patient Active Problem List   Diagnosis Date Noted   Insomnia 02/06/2021   At risk for prolonged QT interval syndrome 02/06/2021   Psychiatric illness 11/01/2020   Chronic use of opiate for therapeutic purpose 06/25/2020   Arthropathy of cervical facet joint (Bilateral) 04/13/2020   Cervical spine pain 04/13/2020   Abnormal MRI, cervical spine (09/20/2015) 04/13/2020   Cervical central spinal stenosis (Multilevel) 04/13/2020   Cervical foraminal stenosis (Right: C3-4) (Bilateral: C4-5, C5-6) 04/13/2020   DDD (degenerative disc disease), cervical 03/14/2020   Chronic shoulder pain (Left) 09/06/2019   Osteoarthritis of shoulder (Left) 09/06/2019   Osteoarthritis of shoulders (Bilateral) (R>L) 10/25/2018   Osteoarthritis of AC (acromioclavicular) joint (Right) 04/02/2018   Chronic acromioclavicular joint pain (Right) 04/02/2018   Chronic shoulder pain (Right) 04/02/2018   Epicondylitis elbow, medial (Left) 04/02/2018  Chronic elbow pain (Left) 03/25/2018   Occipital headache (Left) 12/10/2017   Neurogenic pain 12/10/2017   Cervico-occipital neuralgia (Left) 12/10/2017   Pharmacologic therapy 09/01/2017   Disorder of skeletal system 09/01/2017   Problems influencing health status 09/01/2017   Spondylosis without myelopathy or radiculopathy, cervical region 08/05/2017   Cervicalgia 08/05/2017   Rash 05/13/2017   Acute postoperative pain 06/26/2016    Cervical spondylosis 04/15/2016   Cervical facet syndrome (Bilateral) (R>L) 04/15/2016   Shoulder radicular pain (Bilateral) (R>L) 04/15/2016   Chronic musculoskeletal pain 03/07/2016   Vitamin D insufficiency 12/19/2015   Anxiety 11/23/2015   Chronic low back pain (3ry area of Pain) (Bilateral) (R>L) w/o sciatica 11/23/2015   Chronic neck pain (1ry area of Pain) (Bilateral) (R>L) 11/23/2015   Long term prescription benzodiazepine use 11/23/2015   Long term current use of opiate analgesic 11/23/2015   Long term prescription opiate use 11/23/2015   Opiate use (10 MME/Day) 11/23/2015   Chronic pain syndrome 11/23/2015   Chronic upper extremity pain (Right) 11/23/2015   Chronic cervical radicular pain (Right) 11/23/2015   Chronic shoulder pain (Bilateral) (R>L) 11/23/2015   Hypercholesterolemia 06/20/2015   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   Encounter for long-term (current) use of high-risk medication 06/06/2015   Pain medication agreement signed 11/24/2014   Hyperlipidemia 01/25/2014   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   Diverticulitis 10/04/2012   Osteoarthritis 10/04/2012   Chronic upper back pain (2ry area of Pain) (Bilateral) (R>L) 08/17/2012   Urinary incontinence 08/17/2012   Fibromyalgia 06/04/2012   SLE (systemic lupus erythematosus) ([place]) 02/18/2012   Generalized anxiety disorder 01/07/2012   Major depressive disorder, recurrent ([place]) 09/29/2009   Family History  Problem Relation Age of Onset   Depression Mother    Alcohol abuse Mother    Cancer Mother    Hypertension Mother    Emphysema Mother    Stroke Mother    Glaucoma Father    Heart disease Father    Diabetes Brother    Drug abuse Son    Social History   Tobacco Use   Smoking status: Every Day    Packs/day: 1.50    Years: 35.00    Pack years: 52.50    Types: Cigarettes   Smokeless tobacco: Never   Tobacco comments:    1ppd - 03/01/2021  Substance Use Topics   Alcohol use: No   Allergies   Allergen Reactions   Pregabalin Other (See Comments)   Trazodone Other (See Comments)    Agitation   Bupropion Anxiety   Methocarbamol Other (See Comments)    Agitation and stiffness   Tramadol Other (See Comments)    Other reaction(s): Nausea And Vomiting Reports she feels "high"   Current Meds  Medication Sig   atorvastatin (LIPITOR) 80 MG tablet Take 80 mg by mouth daily.   busPIRone (BUSPAR) 15 MG tablet Take 1 tablet (15 mg total) by mouth 3 (three) times daily. Take daily 1 tablet at 8 AM, 1 tablet at 1 PM and 1 tablet at 5PM   diazepam (VALIUM) 5 MG tablet Take 5 mg by mouth at bedtime.   DULoxetine (CYMBALTA) 30 MG capsule Take 30 mg by mouth daily.   hydroxychloroquine (PLAQUENIL) 200 MG tablet Take 200 mg by mouth 2 (two) times daily.   oxyCODONE (OXY IR/ROXICODONE) 5 MG immediate release tablet Take 1 tablet (5 mg total) by mouth 2 (two) times daily. Must last 30 days   [START ON 03/08/2021] oxyCODONE (OXY IR/ROXICODONE) 5 MG immediate release tablet  Take 1 tablet (5 mg total) by mouth 2 (two) times daily. Must last 30 days   [START ON 04/07/2021] oxyCODONE (OXY IR/ROXICODONE) 5 MG immediate release tablet Take 1 tablet (5 mg total) by mouth 2 (two) times daily. Must last 30 days   pantoprazole (PROTONIX) 40 MG tablet Take by mouth.   Immunization History  Administered Date(s) Administered   PFIZER Comirnaty([place])Covid-19 Tri-Sucrose Vaccine 08/03/2019, 08/24/2019      Objective:   Physical Exam BP 102/64 (BP Location: Left Arm, Patient Position: Sitting, Cuff Size: Normal)    Pulse 96    Temp 98.2 F (36.8 C) (Oral)    Ht 5\' 3"  (1.6 m)    Wt 151 lb 3.2 oz (68.6 kg)    SpO2 99%    BMI 26.78 kg/m  GENERAL: Well-developed, well-nourished woman, no acute distress. HEAD: Normocephalic, atraumatic.  EYES: Pupils equal, round, reactive to light.  No scleral icterus.  MOUTH: Nose/mouth/throat not examined due to masking requirements for COVID 19. NECK: Supple. No  thyromegaly. Trachea midline. No JVD.  No adenopathy. PULMONARY: Good air entry bilaterally.  No adventitious sounds. CARDIOVASCULAR: S1 and S2. Regular rate and rhythm.  No rubs, murmurs or gallops heard. ABDOMEN: Benign. MUSCULOSKELETAL: No joint deformity, no clubbing, no edema.  NEUROLOGIC: No focal deficit, no gait disturbance, speech is fluent. SKIN: Intact,warm,dry. PSYCH: Mood and behavior normal.   Representative image from LDCT performed in February 2023:      Assessment & Plan:     ICD-10-CM   1. Nodule of upper lobe of left lung - 23.4 mm X 19.9 mm   R91.1 CT SUPER D CHEST WO [facility] PILOT   Nodule is part solid Cluster of smaller nodules surrounding Recommend biopsy with robotic bronchoscopy Rule out cancer versus inflammatory process    2. Lupus ([place])  M32.9    Described as chronic discoid lupus She does have some systemic symptoms On Plaquenil This issue adds complexity to her management    3. Current smoker  F17.200    Patient counseled regards to discontinuation of smoking Total counseling time 3 to 5 minutes     Orders Placed This Encounter  Procedures   CT SUPER D CHEST WO [facility] PILOT    [facility] Protocol Arms Down  Inspiratory imaging.  Slice Thickness 7.89 Slice Interval 0.5    Standing Status:   Future    Standing Expiration Date:   03/01/2022    Scheduling Instructions:     Feb 17th    Order Specific Question:   Is patient pregnant?    Answer:   No    Order Specific Question:   Preferred imaging location?    Answer:   [facility]   Benefits, limitations and potential complications of the procedure (robotic assisted bronchoscopy) were discussed with the patient/family.  Complications from bronchoscopy are rare and most often minor, but if they occur they may include breathing difficulty, vocal cord spasm, hoarseness, slight fever, vomiting, dizziness, bronchospasm, infection, low blood oxygen, bleeding from biopsy site, or an allergic  reaction to medications.  It is uncommon for patients to experience other more serious complications for example: Collapsed lung requiring chest tube placement, respiratory failure, heart attack and/or cardiac arrhythmia.  Patient agrees to proceed.  Will procure Monarch protocol CT on same day that her PET/CT is scheduled for.    For the purposes of robotic assisted bronchoscopy the ventilation strategy will be as follows: Tidal volume 370 (7 mL/kg PBW) PEEP of 8-12.  We  will see the patient in follow-up in 4 to 6 weeks time.  She is to contact us prior to that time should any new difficulties arise.   Renold Don, MD Advanced Bronchoscopy PCCM [facility]    *This note was dictated using voice recognition software/Dragon.  Despite best efforts to proofread, errors can occur which can change the meaning. Any transcriptional errors that result from this process are unintentional and may not be fully corrected at the time of dictation.

## 2021-03-02 NOTE — Telephone Encounter (Signed)
Patient aware of pre admit telephone visit scheduled on 2/16. Patient also aware of upcoming covid test on 2/20. Nothing further needed.

## 2021-03-05 ENCOUNTER — Encounter: Payer: Self-pay | Admitting: Pulmonary Disease

## 2021-03-05 ENCOUNTER — Other Ambulatory Visit: Payer: Self-pay

## 2021-03-05 DIAGNOSIS — R911 Solitary pulmonary nodule: Secondary | ICD-10-CM

## 2021-03-05 NOTE — Telephone Encounter (Signed)
For the codes 31627, [phone] Prior Auth Not required  Refer # [id number]

## 2021-03-06 ENCOUNTER — Encounter: Payer: Self-pay | Admitting: Psychiatry

## 2021-03-06 ENCOUNTER — Ambulatory Visit (INDEPENDENT_AMBULATORY_CARE_PROVIDER_SITE_OTHER): Payer: Medicaid Other | Admitting: Psychiatry

## 2021-03-06 ENCOUNTER — Other Ambulatory Visit: Payer: Self-pay

## 2021-03-06 VITALS — BP 137/84 | HR 74 | Temp 97.6°F | Wt 152.0 lb

## 2021-03-06 DIAGNOSIS — F411 Generalized anxiety disorder: Secondary | ICD-10-CM | POA: Diagnosis not present

## 2021-03-06 DIAGNOSIS — F331 Major depressive disorder, recurrent, moderate: Secondary | ICD-10-CM | POA: Diagnosis not present

## 2021-03-06 DIAGNOSIS — G4700 Insomnia, unspecified: Secondary | ICD-10-CM

## 2021-03-06 MED ORDER — MIRTAZAPINE 7.5 MG PO TABS: 7.5000 mg | ORAL_TABLET | Freq: Every day | ORAL | 0 refills | Status: DC

## 2021-03-06 MED ORDER — DULOXETINE HCL 30 MG PO CPEP: 30.0000 mg | ORAL_CAPSULE | Freq: Every day | ORAL | 0 refills | Status: DC

## 2021-03-06 MED ORDER — BUSPIRONE HCL 15 MG PO TABS: 15.0000 mg | ORAL_TABLET | Freq: Every day | ORAL | 0 refills | Status: DC

## 2021-03-06 NOTE — Progress Notes (Signed)
[place] MD OP Progress Note  03/06/2021 2:02 PM Tanya [name]  MRN:  [id number]  Chief Complaint:  Chief Complaint  Patient presents with   Follow-up 61 year old Caucasian female with history of GAD, MDD, insomnia, presented for medication management.   HPI: Tanya [name] is a 61 year old Caucasian female on SSD, separated from husband, lives in [place] with her daughter, has a history of GAD, MDD, insomnia, chronic pain, fibromyalgia, osteoarthritis, SLE was evaluated in office today.  Patient today reports she continues to struggle with anxiety and depressive symptoms.  She reports she has psychosocial stressors of her son who is currently going through substance abuse problems.  It is a constant stressor for her.  Patient also continues to have her own medical problems which is a stressor for her.  Patient reports low mood, problems, concentration problems, anxiety symptoms, worrying about different things.  She also continues to struggle with sleep.  She did not tolerate the Ambien and had nightmares from it.  She hence stopped taking it.  She was started on doxepin by her primary care provider however that also does not seem to be helpful with sleep.  Patient is compliant on the Cymbalta and the BuSpar.  She does not believe the BuSpar is beneficial.  She wants to stay on the Cymbalta since this is the only medication that has worked for her although higher dosages caused her side effects.  Patient denies any suicidality however does report she thinks about not wanting to be here when she has a lot of stressful situation however denies any active suicidal thoughts or plan.  Denies any homicidality.  Denies any perceptual disturbances.  Patient is motivated to start psychotherapy sessions.  Visit Diagnosis:    ICD-10-CM   1. Generalized anxiety disorder  F41.1 busPIRone (BUSPAR) 15 MG tablet    mirtazapine (REMERON) 7.5 MG tablet    DULoxetine (CYMBALTA) 30 MG capsule    2.  Moderate episode of recurrent major depressive disorder (HCC)  F33.1 busPIRone (BUSPAR) 15 MG tablet    mirtazapine (REMERON) 7.5 MG tablet    DULoxetine (CYMBALTA) 30 MG capsule    3. Insomnia, unspecified type  G47.00 mirtazapine (REMERON) 7.5 MG tablet      Past Psychiatric History: Reviewed past psychiatric history from progress note on 02/06/2021.  Past trials of Cymbalta-higher dosage could not tolerate, trazodone-had side effects, Wellbutrin-side effects, Valium, doxepin, Trintellix.  Past Medical History:  Past Medical History:  Diagnosis Date   Anxiety    Bladder infection    8/18   Chronic lower back pain    Chronic neck pain    Collagen vascular disease ([place])    Depression    Diverticulitis    Hyperlipidemia    Lupus ([place])    Overactive bladder     Past Surgical History:  Procedure Laterality Date   ABDOMINAL HYSTERECTOMY     COLONOSCOPY     COLONOSCOPY WITH PROPOFOL N/A 07/03/2017   Procedure: COLONOSCOPY WITH PROPOFOL;  Surgeon: [name], MD;  Location: [facility] ENDOSCOPY;  Service: Endoscopy;  Laterality: N/A;   COLONOSCOPY WITH PROPOFOL N/A 10/21/2017   Procedure: COLONOSCOPY WITH PROPOFOL;  Surgeon: [name], MD;  Location: [facility] ENDOSCOPY;  Service: Endoscopy;  Laterality: N/A;   ESOPHAGOGASTRODUODENOSCOPY N/A 10/21/2017   Procedure: ESOPHAGOGASTRODUODENOSCOPY (EGD);  Surgeon: [name], MD;  Location: [facility] ENDOSCOPY;  Service: Endoscopy;  Laterality: N/A;   TONSILLECTOMY      Family Psychiatric History: Reviewed family psychiatric history from progress note  on 02/06/2021.  Family History:  Family History  Problem Relation Age of Onset   Depression Mother    Alcohol abuse Mother    Cancer Mother    Hypertension Mother    Emphysema Mother    Stroke Mother    Glaucoma Father    Heart disease Father    Diabetes Brother    Drug abuse Son     Social History: Reviewed social history from progress note on 02/06/2021. Social History    Socioeconomic History   Marital status: Married    Spouse name: Not on file   Number of children: Not on file   Years of education: Not on file   Highest education level: Not on file  Occupational History   Not on file  Tobacco Use   Smoking status: Every Day    Packs/day: 1.50    Years: 35.00    Pack years: 52.50    Types: Cigarettes    Passive exposure: Past   Smokeless tobacco: Never   Tobacco comments:    1ppd - 03/01/2021  Vaping Use   Vaping Use: Never used  Substance and Sexual Activity   Alcohol use: No   Drug use: No   Sexual activity: Not Currently  Other Topics Concern   Not on file  Social History Narrative   Not on file   Social Determinants of Health   Financial Resource Strain: Not on file  Food Insecurity: Not on file  Transportation Needs: Not on file  Physical Activity: Not on file  Stress: Not on file  Social Connections: Not on file    Allergies:  Allergies  Allergen Reactions   Pregabalin Other (See Comments)    unknown   Trazodone Other (See Comments)    Agitation   Bupropion Anxiety   Methocarbamol Other (See Comments)    Agitation and stiffness   Tramadol Other (See Comments)    Nausea And Vomiting Reports she feels "high"    Metabolic Disorder Labs: No results found for: HGBA1C, MPG No results found for: PROLACTIN No results found for: CHOL, TRIG, HDL, CHOLHDL, VLDL, LDLCALC Lab Results  Component Value Date   TSH 3.347 02/07/2021    Therapeutic Level Labs: No results found for: LITHIUM No results found for: VALPROATE No components found for:  CBMZ  Current Medications: Current Outpatient Medications  Medication Sig Dispense Refill   atorvastatin (LIPITOR) 80 MG tablet Take 80 mg by mouth every evening.     busPIRone (BUSPAR) 15 MG tablet Take 1 tablet (15 mg total) by mouth daily with breakfast. Dose reduction 90 tablet 0   DULoxetine (CYMBALTA) 30 MG capsule Take 1 capsule (30 mg total) by mouth daily. 90 capsule 0    gabapentin (NEURONTIN) 300 MG capsule Take 1 capsule (300 mg total) by mouth 3 (three) times daily. 90 capsule 2   hydroxychloroquine (PLAQUENIL) 200 MG tablet Take 200 mg by mouth in the morning.     mirtazapine (REMERON) 7.5 MG tablet Take 1 tablet (7.5 mg total) by mouth at bedtime. 30 tablet 0   oxyCODONE (OXY IR/ROXICODONE) 5 MG immediate release tablet Take 1 tablet (5 mg total) by mouth 2 (two) times daily. Must last 30 days (Patient taking differently: Take 5 mg by mouth in the morning, at noon, and at bedtime. Must last 30 days) 60 tablet 0   [START ON 03/08/2021] oxyCODONE (OXY IR/ROXICODONE) 5 MG immediate release tablet Take 1 tablet (5 mg total) by mouth 2 (two) times daily. Must last  30 days 60 tablet 0   [START ON 04/07/2021] oxyCODONE (OXY IR/ROXICODONE) 5 MG immediate release tablet Take 1 tablet (5 mg total) by mouth 2 (two) times daily. Must last 30 days 60 tablet 0   pantoprazole (PROTONIX) 40 MG tablet Take 40 mg by mouth in the morning.     No current facility-administered medications for this visit.     Musculoskeletal: Strength & Muscle Tone: within normal limits Gait & Station: normal Patient leans: N/A  Psychiatric Specialty Exam: Review of Systems  Musculoskeletal:  Positive for back pain.  Psychiatric/Behavioral:  Positive for dysphoric mood and sleep disturbance. The patient is nervous/anxious.   All other systems reviewed and are negative.  Blood pressure 137/84, pulse 74, temperature 97.6 F (36.4 C), temperature source Temporal, weight 152 lb (68.9 kg).Body mass index is 26.93 kg/m.  General Appearance: Casual  Eye Contact:  Good  Speech:  Clear and Coherent  Volume:  Normal  Mood:  Anxious and Depressed  Affect:  Congruent  Thought Process:  Goal Directed and Descriptions of Associations: Intact  Orientation:  Full (Time, Place, and Person)  Thought Content: Logical   Suicidal Thoughts:  No  Homicidal Thoughts:  No  Memory:  Immediate;    Fair Recent;   Fair Remote;   Fair  Judgement:  Fair  Insight:  Fair  Psychomotor Activity:  Normal  Concentration:  Concentration: Fair and Attention Span: Fair  Recall:  [employer] of Knowledge: Fair  Language: Fair  Akathisia:  No  Handed:  Right  AIMS (if indicated): done, 0  Assets:  Communication Skills Desire for Improvement Housing Social Support  ADL's:  Intact  Cognition: WNL  Sleep:  Poor   Screenings: GAD-7    Flowsheet Row Office Visit from 02/06/2021 in [place]  Total GAD-7 Score 19      PHQ2-9    [place] Visit from 03/06/2021 in [place] Office Visit from 02/06/2021 in [place] Procedure visit from 04/13/2020 in [place] Office Visit from 04/03/2020 in [place] Procedure visit from 12/30/2019 in [place]  PHQ-2 Total Score 3 3 0 0 0  PHQ-9 Total Score 12 10 -- -- --      [place] Office Visit from 03/06/2021 in [place] Office Visit from 02/06/2021 in [place] ED from 12/15/2020 in [facility] No Risk Error: Question 6 not populated        Assessment and Plan: DANINE HOR is a 61 year old Caucasian female, on disability, separated, lives in [place] with her daughter, has a history of depression, anxiety, chronic pain, GERD, SLE, chronic discoid lupus, fibromyalgia, hyperlipidemia was evaluated in office today.  Patient continues to struggle with mood, sleep, will benefit from medication management and psychotherapy sessions.  Plan  GAD-unstable Continue Cymbalta 30 mg p.o. daily.  Higher dosages cause side effects. She is not interested in changing the Cymbalta to another SSRI or SNRI at  this time. We will reduce BuSpar to 15 mg p.o. daily. Refer for CBT  MDD-unstable Cymbalta 30 mg p.o. daily Add mirtazapine 7.5 mg p.o. nightly Referral for CBT  Insomnia-unstable Add mirtazapine 7.5 mg p.o. nightly . Will need sufficient pain management. Discontinue doxepin.  At risk for prolonged QT syndrome-reviewed and discussed EKG dated 02/07/2021-normal sinus  [id number].  Reviewed labs-discussed with patient-02/07/2021- TSH within normal limits.  Follow-up in clinic in 2 weeks or sooner in person.  This note was generated in part or whole with voice recognition software. Voice recognition is usually quite accurate but there are transcription errors that can and very often do occur. I apologize for any typographical errors that were not detected and corrected.     Ursula Alert, MD 03/07/2021, 8:27 AM

## 2021-03-06 NOTE — Patient Instructions (Signed)
Mirtazapine Tablets What is this medication? MIRTAZAPINE (mir TAZ a peen) treats depression. It increases the amount of serotonin and norepinephrine in the brain, hormones that help regulate mood. This medicine may be used for other purposes; ask your health care provider or pharmacist if you have questions. COMMON BRAND NAME(S): Remeron What should I tell my care team before I take this medication? They need to know if you have any of these conditions: Bipolar disorder Glaucoma Kidney disease Liver disease Suicidal thoughts An unusual or allergic reaction to mirtazapine, other medications, foods, dyes, or preservatives Pregnant or trying to get pregnant Breast-feeding How should I use this medication? Take this medication by mouth with a glass of water. Follow the directions on the prescription label. Take your medication at regular intervals. Do not take your medication more often than directed. Do not stop taking this medication suddenly except upon the advice of your care team. Stopping this medication too quickly may cause serious side effects or your condition may worsen. A special MedGuide will be given to you by the pharmacist with each prescription and refill. Be sure to read this information carefully each time. Talk to your care team about the use of this medication in children. Special care may be needed. Overdosage: If you think you have taken too much of this medicine contact a poison control center or emergency room at once. NOTE: This medicine is only for you. Do not share this medicine with others. What if I miss a dose? If you miss a dose, take it as soon as you can. If it is almost time for your next dose, take only that dose. Do not take double or extra doses. What may interact with this medication? Do not take this medication with any of the following: Linezolid MAOIs like Carbex, Eldepryl, Marplan, Nardil, and Parnate Methylene blue (injected into a vein) This  medication may also interact with the following: Alcohol Antiviral medications for HIV or AIDS Certain medications that treat or prevent blood clots like warfarin Certain medications for depression, anxiety, or psychotic disturbances Certain medications for fungal infections like ketoconazole and itraconazole Certain medications for migraine headache like almotriptan, eletriptan, frovatriptan, naratriptan, rizatriptan, sumatriptan, zolmitriptan Certain medications for seizures like carbamazepine or phenytoin Certain medications for sleep Cimetidine Erythromycin Fentanyl Lithium Medications for blood pressure Nefazodone Rasagiline Rifampin Supplements like St. John's wort, kava kava, valerian Tramadol Tryptophan This list may not describe all possible interactions. Give your health care provider a list of all the medicines, herbs, non-prescription drugs, or dietary supplements you use. Also tell them if you smoke, drink alcohol, or use illegal drugs. Some items may interact with your medicine. What should I watch for while using this medication? Tell your care team if your symptoms do not get better or if they get worse. Visit your care team for regular checks on your progress. Because it may take several weeks to see the full effects of this medication, it is important to continue your treatment as prescribed by your doctor. Patients and their families should watch out for new or worsening thoughts of suicide or depression. Also watch out for sudden changes in feelings such as feeling anxious, agitated, panicky, irritable, hostile, aggressive, impulsive, severely restless, overly excited and hyperactive, or not being able to sleep. If this happens, especially at the beginning of treatment or after a change in dose, call your care team. You may get drowsy or dizzy. Do not drive, use machinery, or do anything that needs mental alertness until  you know how this medication affects you. Do not  stand or sit up quickly, especially if you are an older patient. This reduces the risk of dizzy or fainting spells. Alcohol may interfere with the effect of this medication. Avoid alcoholic drinks. This medication may cause dry eyes and blurred vision. If you wear contact lenses you may feel some discomfort. Lubricating drops may help. See your eye care team if the problem does not go away or is severe. Your mouth may get dry. Chewing sugarless gum or sucking hard candy, and drinking plenty of water may help. Contact your care team if the problem does not go away or is severe. What side effects may I notice from receiving this medication? Side effects that you should report to your care team as soon as possible: Allergic reactions--skin rash, itching, hives, swelling of the face, lips, tongue, or throat Heart rhythm changes--fast or irregular heartbeat, dizziness, feeling faint or lightheaded, chest pain, trouble breathing Infection--fever, chills, cough, or sore throat Irritability, confusion, fast or irregular heartbeat, muscle stiffness, twitching muscles, sweating, high fever, seizure, chills, vomiting, diarrhea, which may be signs of serotonin syndrome Low sodium level--muscle weakness, fatigue, dizziness, headache, confusion Rash, fever, and swollen lymph nodes Redness, blistering, peeling or loosening of the skin, including inside the mouth Seizures Sudden eye pain or change in vision such as blurry vision, seeing halos around lights, vision loss Thoughts of suicide or self-harm, worsening mood, feelings of depression Side effects that usually do not require medical attention (report to your care team if they continue or are bothersome): Constipation Dizziness Drowsiness Dry mouth Increase in appetite Weight gain This list may not describe all possible side effects. Call your doctor for medical advice about side effects. You may report side effects to FDA at 1-800-FDA-1088. Where should  I keep my medication? Keep out of the reach of children. Store at room temperature between 15 and 30 degrees C (59 and 86 degrees F) Protect from light and moisture. Throw away any unused medication after the expiration date. NOTE: This sheet is a summary. It may not cover all possible information. If you have questions about this medicine, talk to your doctor, pharmacist, or health care provider.  2022 Elsevier/Gold Standard (2020-04-05 00:00:00)

## 2021-03-08 ENCOUNTER — Other Ambulatory Visit: Payer: Self-pay

## 2021-03-08 ENCOUNTER — Encounter
Admission: RE | Admit: 2021-03-08 | Discharge: 2021-03-08 | Disposition: A | Discharge status: Home / Self Care | Payer: Medicaid Other | Source: Ambulatory Visit | Attending: Pulmonary Disease | Admitting: Pulmonary Disease

## 2021-03-08 HISTORY — DX: Gastro-esophageal reflux disease without esophagitis: K21.9

## 2021-03-08 HISTORY — DX: COVID-19: U07.1

## 2021-03-08 NOTE — Patient Instructions (Signed)
Your procedure is scheduled on: 03/14/21 Report to [place]. To find out your arrival time please call [phone] between 1PM - 3PM on 03/13/21.  Remember: Instructions that are not followed completely may result in serious medical risk, up to and including death, or upon the discretion of your surgeon and anesthesiologist your surgery may need to be rescheduled.     _X__ 1. Do not eat food ord rink any liquids after midnight the night before your procedure.                 No gum chewing or hard candies.   __X__2.  On the morning of surgery brush your teeth with toothpaste and water, you                 may rinse your mouth with mouthwash if you wish.  Do not swallow any              toothpaste of mouthwash.     _X__ 3.  No Alcohol for 24 hours before or after surgery.   _X__ 4.  Do Not Smoke or use e-cigarettes For 24 Hours Prior to Your Surgery.                 Do not use any chewable tobacco products for at least 6 hours prior to                 surgery.  ____  5.  Bring all medications with you on the day of surgery if instructed.   __X__  6.  Notify your doctor if there is any change in your medical condition      (cold, fever, infections).     Do not wear jewelry, make-up, hairpins, clips or nail polish. Do not wear lotions, powders, or perfumes. You may use deodorant Do not shave body hair 48 hours prior to surgery. Men may shave face and neck. Do not bring valuables to the hospital.    [facility] is not responsible for any belongings or valuables.  Contacts, dentures/partials or body piercings may not be worn into surgery. Bring a case for your contacts, glasses or hearing aids, a denture cup will be supplied. Leave your suitcase in the car. After surgery it may be brought to your room. For patients admitted to the hospital, discharge time is determined by your treatment team.   Patients discharged the day of  surgery will not be allowed to drive home.   Please read over the following fact sheets that you were given:     __X__ Take these medicines the morning of surgery with A SIP OF WATER:    1. busPIRone (BUSPAR) 15 MG tablet  2. DULoxetine (CYMBALTA) 30 MG capsule  3. gabapentin (NEURONTIN) 300 MG capsule  4. pantoprazole (PROTONIX) 40 MG tablet  5. oxyCODONE (OXY IR/ROXICODONE) 5 MG immediate release tablet if needed  6.  ____ Fleet Enema (as directed)   __X__ Use CHG Soap/SAGE wipes as directed  ____ Use inhalers on the day of surgery  ____ Stop metformin/Janumet/Farxiga 2 days prior to surgery    ____ Take 1/2 of usual insulin dose the night before surgery. No insulin the morning          of surgery.   ____ Stop Blood Thinners Coumadin/Plavix/Xarelto/Pleta/Pradaxa/Eliquis/Effient/Aspirin  on   Or contact your Surgeon, Cardiologist or Medical Doctor regarding  ability to stop your blood thinners  __X__ Stop Anti-inflammatories  7 days before surgery such as Advil, Ibuprofen, Motrin,  BC or Goodies Powder, Naprosyn, Naproxen, Aleve, Aspirin    __X__ Stop all herbals and supplements, fish oil or vitamins  until after surgery.    ____ Bring C-Pap to the hospital.

## 2021-03-09 ENCOUNTER — Telehealth: Payer: Self-pay

## 2021-03-09 ENCOUNTER — Encounter
Admission: RE | Admit: 2021-03-09 | Discharge: 2021-03-09 | Disposition: A | Discharge status: Home / Self Care | Payer: Medicaid Other | Source: Ambulatory Visit | Attending: Acute Care | Admitting: Acute Care

## 2021-03-09 ENCOUNTER — Ambulatory Visit
Admission: RE | Admit: 2021-03-09 | Discharge: 2021-03-09 | Disposition: A | Discharge status: Home / Self Care | Payer: Medicaid Other | Source: Ambulatory Visit | Attending: Pulmonary Disease | Admitting: Pulmonary Disease

## 2021-03-09 DIAGNOSIS — R911 Solitary pulmonary nodule: Secondary | ICD-10-CM | POA: Insufficient documentation

## 2021-03-09 LAB — GLUCOSE, CAPILLARY: Glucose-Capillary: 102 mg/dL — ABNORMAL HIGH (ref 70–99)

## 2021-03-09 MED ORDER — FLUDEOXYGLUCOSE F - 18 (FDG) INJECTION
8.3300 | Freq: Once | INTRAVENOUS | Status: AC | PRN
  Administered 2021-03-09: 8.33 via INTRAVENOUS

## 2021-03-09 NOTE — Telephone Encounter (Signed)
Called and LVM for patient in regards to upcoming covid test. Will try again later.

## 2021-03-09 NOTE — Telephone Encounter (Signed)
Noted.  Will close encounter.  

## 2021-03-12 ENCOUNTER — Other Ambulatory Visit: Payer: Self-pay

## 2021-03-12 ENCOUNTER — Other Ambulatory Visit
Admission: RE | Admit: 2021-03-12 | Discharge: 2021-03-12 | Disposition: A | Discharge status: Home / Self Care | Payer: Medicaid Other | Source: Ambulatory Visit | Attending: Pulmonary Disease | Admitting: Pulmonary Disease

## 2021-03-12 DIAGNOSIS — Z20822 Contact with and (suspected) exposure to covid-19: Secondary | ICD-10-CM | POA: Diagnosis not present

## 2021-03-12 DIAGNOSIS — Z01812 Encounter for preprocedural laboratory examination: Secondary | ICD-10-CM | POA: Diagnosis present

## 2021-03-12 LAB — SARS CORONAVIRUS 2 (TAT 6-24 HRS): SARS Coronavirus 2: NEGATIVE

## 2021-03-13 ENCOUNTER — Ambulatory Visit: Payer: Medicaid Other | Attending: Pulmonary Disease

## 2021-03-13 DIAGNOSIS — R911 Solitary pulmonary nodule: Secondary | ICD-10-CM | POA: Insufficient documentation

## 2021-03-13 MED ORDER — ALBUTEROL SULFATE (2.5 MG/3ML) 0.083% IN NEBU
2.5000 mg | INHALATION_SOLUTION | Freq: Once | RESPIRATORY_TRACT | Status: AC
  Administered 2021-03-13: 2.5 mg via RESPIRATORY_TRACT
  Filled 2021-03-13: qty 3

## 2021-03-13 MED ORDER — ALBUTEROL SULFATE (2.5 MG/3ML) 0.083% IN NEBU
2.5000 mg | INHALATION_SOLUTION | Freq: Once | RESPIRATORY_TRACT | Status: AC
  Filled 2021-03-13: qty 3

## 2021-03-14 ENCOUNTER — Ambulatory Visit
Admission: RE | Admit: 2021-03-14 | Discharge: 2021-03-14 | Disposition: A | Discharge status: Home / Self Care | Payer: Medicaid Other | Attending: Pulmonary Disease | Admitting: Pulmonary Disease

## 2021-03-14 ENCOUNTER — Ambulatory Visit: Payer: Medicaid Other | Admitting: Certified Registered"

## 2021-03-14 ENCOUNTER — Ambulatory Visit: Payer: Medicaid Other

## 2021-03-14 ENCOUNTER — Encounter: Payer: Self-pay | Admitting: Pulmonary Disease

## 2021-03-14 ENCOUNTER — Encounter
Admission: RE | Disposition: A | Discharge status: Home / Self Care | Payer: Self-pay | Source: Home / Self Care | Attending: Pulmonary Disease

## 2021-03-14 DIAGNOSIS — M797 Fibromyalgia: Secondary | ICD-10-CM | POA: Insufficient documentation

## 2021-03-14 DIAGNOSIS — K219 Gastro-esophageal reflux disease without esophagitis: Secondary | ICD-10-CM | POA: Diagnosis not present

## 2021-03-14 DIAGNOSIS — F418 Other specified anxiety disorders: Secondary | ICD-10-CM | POA: Insufficient documentation

## 2021-03-14 DIAGNOSIS — L930 Discoid lupus erythematosus: Secondary | ICD-10-CM

## 2021-03-14 DIAGNOSIS — M329 Systemic lupus erythematosus, unspecified: Secondary | ICD-10-CM | POA: Insufficient documentation

## 2021-03-14 DIAGNOSIS — F17210 Nicotine dependence, cigarettes, uncomplicated: Secondary | ICD-10-CM | POA: Diagnosis not present

## 2021-03-14 DIAGNOSIS — Z9889 Other specified postprocedural states: Secondary | ICD-10-CM

## 2021-03-14 DIAGNOSIS — I2510 Atherosclerotic heart disease of native coronary artery without angina pectoris: Secondary | ICD-10-CM | POA: Diagnosis not present

## 2021-03-14 DIAGNOSIS — R911 Solitary pulmonary nodule: Secondary | ICD-10-CM | POA: Insufficient documentation

## 2021-03-14 SURGERY — BRONCHOSCOPY, WITH BIOPSY USING ELECTROMAGNETIC NAVIGATION
Anesthesia: General | Laterality: Left

## 2021-03-14 MED ORDER — SUGAMMADEX SODIUM 200 MG/2ML IV SOLN
INTRAVENOUS | Status: DC | PRN
  Administered 2021-03-14: 200 mg via INTRAVENOUS
  Administered 2021-03-14: 80 mg via INTRAVENOUS

## 2021-03-14 MED ORDER — ROCURONIUM BROMIDE 10 MG/ML (PF) SYRINGE
PREFILLED_SYRINGE | INTRAVENOUS | Status: AC
  Filled 2021-03-14: qty 10

## 2021-03-14 MED ORDER — LABETALOL HCL 5 MG/ML IV SOLN
INTRAVENOUS | Status: DC | PRN
  Administered 2021-03-14: 5 mg via INTRAVENOUS

## 2021-03-14 MED ORDER — IPRATROPIUM-ALBUTEROL 0.5-2.5 (3) MG/3ML IN SOLN
RESPIRATORY_TRACT | Status: AC
  Administered 2021-03-14: 3 mL via RESPIRATORY_TRACT
  Filled 2021-03-14: qty 3

## 2021-03-14 MED ORDER — ROCURONIUM BROMIDE 100 MG/10ML IV SOLN
INTRAVENOUS | Status: DC | PRN
  Administered 2021-03-14: 5 mg via INTRAVENOUS
  Administered 2021-03-14: 40 mg via INTRAVENOUS
  Administered 2021-03-14: 10 mg via INTRAVENOUS

## 2021-03-14 MED ORDER — ONDANSETRON HCL 4 MG/2ML IJ SOLN
INTRAMUSCULAR | Status: DC | PRN
  Administered 2021-03-14: 4 mg via INTRAVENOUS

## 2021-03-14 MED ORDER — CHLORHEXIDINE GLUCONATE 0.12 % MT SOLN
OROMUCOSAL | Status: AC
  Administered 2021-03-14: 15 mL via OROMUCOSAL
  Filled 2021-03-14: qty 15

## 2021-03-14 MED ORDER — CODEINE SULFATE 30 MG PO TABS
30.0000 mg | ORAL_TABLET | Freq: Once | ORAL | Status: DC
  Filled 2021-03-14: qty 1

## 2021-03-14 MED ORDER — FENTANYL CITRATE (PF) 100 MCG/2ML IJ SOLN
INTRAMUSCULAR | Status: DC | PRN
  Administered 2021-03-14 (×2): 50 ug via INTRAVENOUS

## 2021-03-14 MED ORDER — PROPOFOL 10 MG/ML IV BOLUS
INTRAVENOUS | Status: DC | PRN
  Administered 2021-03-14: 150 mg via INTRAVENOUS

## 2021-03-14 MED ORDER — CHLORHEXIDINE GLUCONATE 0.12 % MT SOLN: 15.0000 mL | Freq: Once | OROMUCOSAL | Status: AC

## 2021-03-14 MED ORDER — SODIUM CHLORIDE 0.9 % IV SOLN: Freq: Once | INTRAVENOUS | Status: DC

## 2021-03-14 MED ORDER — LIDOCAINE HCL (CARDIAC) PF 100 MG/5ML IV SOSY
PREFILLED_SYRINGE | INTRAVENOUS | Status: DC | PRN
  Administered 2021-03-14: 100 mg via INTRAVENOUS

## 2021-03-14 MED ORDER — LACTATED RINGERS IV SOLN: INTRAVENOUS | Status: DC

## 2021-03-14 MED ORDER — FENTANYL CITRATE (PF) 100 MCG/2ML IJ SOLN
INTRAMUSCULAR | Status: AC
  Filled 2021-03-14: qty 2

## 2021-03-14 MED ORDER — ORAL CARE MOUTH RINSE: 15.0000 mL | Freq: Once | OROMUCOSAL | Status: AC

## 2021-03-14 MED ORDER — LIDOCAINE HCL (PF) 2 % IJ SOLN
INTRAMUSCULAR | Status: AC
  Filled 2021-03-14: qty 5

## 2021-03-14 MED ORDER — DEXAMETHASONE SODIUM PHOSPHATE 10 MG/ML IJ SOLN
INTRAMUSCULAR | Status: DC | PRN
  Administered 2021-03-14 (×2): 5 mg via INTRAVENOUS

## 2021-03-14 MED ORDER — IPRATROPIUM-ALBUTEROL 0.5-2.5 (3) MG/3ML IN SOLN: 3.0000 mL | Freq: Once | RESPIRATORY_TRACT | Status: AC

## 2021-03-14 MED ORDER — MIDAZOLAM HCL 2 MG/2ML IJ SOLN
INTRAMUSCULAR | Status: AC
  Filled 2021-03-14: qty 2

## 2021-03-14 MED ORDER — DEXAMETHASONE SODIUM PHOSPHATE 10 MG/ML IJ SOLN
INTRAMUSCULAR | Status: AC
  Filled 2021-03-14: qty 1

## 2021-03-14 MED ORDER — ONDANSETRON HCL 4 MG/2ML IJ SOLN
INTRAMUSCULAR | Status: AC
  Filled 2021-03-14: qty 2

## 2021-03-14 MED ORDER — LABETALOL HCL 5 MG/ML IV SOLN
INTRAVENOUS | Status: AC
  Filled 2021-03-14: qty 4

## 2021-03-14 MED ORDER — MIDAZOLAM HCL 2 MG/2ML IJ SOLN
INTRAMUSCULAR | Status: DC | PRN
  Administered 2021-03-14: 2 mg via INTRAVENOUS

## 2021-03-14 MED ORDER — DIPHENHYDRAMINE HCL 50 MG/ML IJ SOLN: 12.5000 mg | Freq: Once | INTRAMUSCULAR | Status: DC

## 2021-03-14 MED ORDER — PROPOFOL 10 MG/ML IV BOLUS
INTRAVENOUS | Status: AC
  Filled 2021-03-14: qty 20

## 2021-03-14 NOTE — Op Note (Signed)
PROCEDURES: Robotic assisted bronchoscopy - [facility] Cellvizio probe based confocal laser endomicroscopy (pCLE) Augmented fluoroscopy - Body Vision   Indication: 61 year old current smoker with a 2.5 x 1.7 cm left upper lobe lesion, subsolid.  In retrospect, lesion noted on chest x-ray since May 2020 significantly grown since then.  Preoperative Diagnosis: Left upper lobe subsolid lesion rule out carcinoma versus inflammatory change Post Procedure Diagnosis: Same as above Consent: Verbal/Written: obtained  Benefits, limitations and potential complications of the procedure were discussed with the patient.  Complications from bronchoscopy are rare and most often minor, but if they occur they may include breathing difficulty, vocal cord spasm, hoarseness, slight fever, vomiting, dizziness, bronchospasm, infection, low blood oxygen, bleeding from biopsy site, or an allergic reaction to medications.  It is uncommon for patients to experience other more serious complications for example: Collapsed lung requiring chest tube placement, respiratory failure, heart attack and/or cardiac arrhythmia.  Patient understood the potential complications and agreed to proceed.  Patient understood and agreed to proceed.  Surgeon: Renold Don, MD Assistant/Scrub: Liborio Nixon, RRT Circulator: None Anesthesiologist/CRNA: Daron Offer, MD/Munesh Terie Purser) Dalene Carrow, CRNA Cytotechnology: [facility] Fluoroscopy technician: August Saucer, RT Representatives: Patrick North, Auris [facility]/Freddy Alen Blew, Body Vision  Type of Anesthesia: General endotracheal  Procedures Performed:   Robotic bronchoscopy: Procedure consists of robotic navigation comprised of electromagnetics, optical pattern recognition and robotic kinematic data - to triangulate bronchoscope location during the procedure and provide accurate positional data to biopsy a lesion. Cellvizio probe based confocal laser endomicroscopy (pCLE) utilizing blue laser  endomicroscopy. Augmented fluoroscopy with Body Vision.  Description of Procedure:  The patient was brought to Procedure Room 2 (Bronchoscopy Suite) in the OR area where appropriate timeout was taken with the staff after the patient was inducted under general anesthesia.  The patient was inducted under general anesthesia and intubated by the anesthesia team.  Patient was intubated with a 9.0 ET tube without difficulty.  Tube was secured at 4 cm above the carina.  A Portex adapter was placed on the ET tube flange.  Once the patient was under adequate general anesthesia the Olympus therapeutic video bronchoscope was advanced and an anatomic airway tour and surveillance bronchoscopy was performed.The distal trachea appeared unremarkable. The main carina was sharp.  No secretions were seen in either right or left mainstem bronchi. The RUL, RLL, RML appeared to be free of endobronchial masses, lesions, or purulent secretions.  There were some scant clear secretions that were lavaged until clear.  Likewise, the LLL/LUL appeared to be free of endobronchial masses, lesions, or purulent secretions.  There were some scant benign appearing secretions on the left upper lobe and lingula that were suctioned until cleared.  Once the survey bronchoscopy was completed, registration for the augmented fluoroscopy (Body Vision) was then performed with the fluoroscopic C arm.  Once this was completed, the robotic bronchoscope ET tube adapter was placed and ETT was cut to proper length and secured on the mid plane.  The [facility] robotic scope was then advanced through the ETT and registration was performed successfully.  There was good correlation between the robotic mapping and bronchoscopic mapping. With the assistance of fused navigation, the bronchoscope was advanced to the LUL nodule/mass. The tip of the working channel sat within 18 mm of the nodule  Positioning was confirmed with augmented fluoroscopy.  At this point Cellvizio  probe based confocal laser endomicroscopy (pCLE) was utilized to confirm target acquisition.  The images through [facility] were consistent with targeted lesion.  Augmented fluoroscopy via Body Vision was utilized to optimize the position most favorable for biopsies, then the robotic bronchoscope was anchored to maintain position.  At this point a transbronchial biopsy was obtained and subjected to ROSE.  There appeared to be inflammatory cells and  macrophages.  The bronchoscope was then repositioned more optimal sampling and verified with Body Vision, the bronchoscope was then anchored in this position transbronchial biopsies were obtained at this point, the lesion could be seen moving during biopsies.  A total of 10 biopsies were then obtained and placed in formalin.  After confirmation of excellent hemostasis, 2 passes with a cytology brush on the LUL nodule/mass were performed, the brush was then cut into CytoLyt preservative.  The first brush pass was subjected to ROSE and again inflammatory changes were seen.  A targeted BAL was also obtained at this segment, which sent for cytology analysis.  For BAL sample acquisition purposes, 40 ml of normal saline were instilled, and approximately 10 ml were recovered/trapped and sent for analysis.   The robotic bronchoscope was then retracted all the way out after confirmation of excellent hemostasis.  There was some minimal heme noted but no active bleeding at the time of retraction of the bronchoscope.  The patient received bronchial lavage with 10 mL of 1% lidocaine via the ET tube. The patient tolerated the procedure well. No significant bleeding was observed at the conclusion of the procedure.  At this point, the patient was allowed to emerge from general anesthesia, and was extubated in the procedure room without incident.  The patient  was taken to the PACU in satisfactory condition.  Auscultation of the lungs showed no change from pre bronchoscopy examination.   Patient tolerated the procedure well with no untoward effects of anesthesia noted.   Specimens Obtained:  Transbronchial Forceps Biopsy: X10  Transbronchial Brush: X2 passes, 1 brush utilized  Targeted BAL: 40 mL shielding 10 mL aliquot  Fluoroscopy: Augmented fluoroscopy (Body Vision) was utilized during the course of this procedure to assure that biopsies were taken in a safe manner under fluoroscopic guidance with spot films required.  Total fluoroscopy time: 5 minutes 3 seconds  Intraoperative images:  LUL lesion with    Tool in lesion:       Cellvizio images: Inflammatory cells and macrophages noted, abnormal lung tissue.   Loss of normal pulmonary architecture, possible adenocarcinoma.   Loss of architecture and extensive inflammation versus lepidic growth.    Complications:None, no pneumothorax on post film, lesion visible, see arrow:   Estimated Blood Loss: Approximately 10 mL    Assessment and Plan/Additional Comments: Left upper lobe subsolid lesion significantly grown since 2020, query adenocarcinoma with lepidic growth versus inflammatory nodule associated with lupus.  Status post robotic assisted bronchoscopy. Await pathology reports     C. Derrill Kay, MD Advanced Bronchoscopy PCCM [facility]    *This note was dictated using voice recognition software/Dragon.  Despite best efforts to proofread, errors can occur which can change the meaning.  Any change was purely unintentional.

## 2021-03-14 NOTE — Anesthesia Preprocedure Evaluation (Signed)
Anesthesia Evaluation  Patient identified by MRN, date of birth, ID band Patient awake    Reviewed: Allergy & Precautions, NPO status , Patient's Chart, lab work & pertinent test results  Airway Mallampati: III  TM Distance: >3 FB Neck ROM: full    Dental  (+) Chipped   Pulmonary shortness of breath and with exertion, Current Smoker,  LUNG NODULE LEFT  OSA score 2   Pulmonary exam normal        Cardiovascular Exercise Tolerance: Good METS: 3 - Mets + angina (denies chest pain but states she has chest pressure with exertion that is stable and only every few weeks. ) + CAD (Mild coronary artery calcifications of the LAD seen on CT scan)  Normal cardiovascular exam  EKG 1/23: Normal sinus rhythm T wave abnormality, consider anterior ischemia Abnormal ECG No previous ECGs available   Neuro/Psych  Headaches, PSYCHIATRIC DISORDERS Anxiety Depression    GI/Hepatic GERD  Medicated and Controlled,(+)     substance abuse  ,   Endo/Other  negative endocrine ROS  Renal/GU      Musculoskeletal  (+) Arthritis , Fibromyalgia -, narcotic dependentCervical foraminal stenosis   Abdominal   Peds  Hematology negative hematology ROS (+)   Anesthesia Other Findings chronic discoid lupus  Past Medical History: No date: Anxiety No date: Bladder infection     Comment:  8/18 No date: Chronic lower back pain No date: Chronic neck pain No date: Collagen vascular disease ([place]) No date: COVID-19 No date: Depression No date: Diverticulitis No date: GERD (gastroesophageal reflux disease) No date: Hyperlipidemia No date: Lupus ([place]) No date: Overactive bladder  Past Surgical History: No date: ABDOMINAL HYSTERECTOMY No date: COLONOSCOPY 07/03/2017: COLONOSCOPY WITH PROPOFOL; N/A     Comment:  Procedure: COLONOSCOPY WITH PROPOFOL;  Surgeon:               [name], MD;  Location: [facility] ENDOSCOPY;                Service:  Endoscopy;  Laterality: N/A; 10/21/2017: COLONOSCOPY WITH PROPOFOL; N/A     Comment:  Procedure: COLONOSCOPY WITH PROPOFOL;  Surgeon:               [name], MD;  Location: [facility] ENDOSCOPY;                Service: Endoscopy;  Laterality: N/A; 10/21/2017: ESOPHAGOGASTRODUODENOSCOPY; N/A     Comment:  Procedure: ESOPHAGOGASTRODUODENOSCOPY (EGD);  Surgeon:               [name], MD;  Location: [facility] ENDOSCOPY;                Service: Endoscopy;  Laterality: N/A; No date: TONSILLECTOMY     Reproductive/Obstetrics negative OB ROS                             Anesthesia Physical Anesthesia Plan  ASA: 3  Anesthesia Plan: General ETT   Post-op Pain Management:    Induction: Intravenous  PONV Risk Score and Plan: Ondansetron, Dexamethasone, Midazolam and Treatment may vary due to age or medical condition  Airway Management Planned: Oral ETT  Additional Equipment:   Intra-op Plan:   Post-operative Plan: Extubation in OR  Informed Consent: I have reviewed the patients History and Physical, chart, labs and discussed the procedure including the risks, benefits and alternatives for the proposed anesthesia with the patient or authorized representative who has  indicated his/her understanding and acceptance.     Dental Advisory Given  Plan Discussed with: Anesthesiologist, CRNA and Surgeon  Anesthesia Plan Comments:         Anesthesia Quick Evaluation

## 2021-03-14 NOTE — Interval H&P Note (Signed)
History and Physical Interval Note:  03/14/2021 11:42 AM  Tanya [name]  has presented today for surgery, with the diagnosis of LUNG NODULE LEFT.  The various methods of treatment have been discussed with the patient and family. After consideration of risks, benefits and other options for treatment, the patient has consented to  Procedure(s): ROBOTIC ASSISTED NAVIGATIONAL BRONCHOSCOPY (Left) as a surgical intervention.  The patient's history has been reviewed, patient examined, no change in status, stable for surgery.  I have reviewed the patient's chart and labs.  Questions were answered to the patient's satisfaction.     Vernard Gambles

## 2021-03-14 NOTE — Transfer of Care (Signed)
Immediate Anesthesia Transfer of Care Note  Patient: Tanya [name]  Procedure(s) Performed: ROBOTIC ASSISTED NAVIGATIONAL BRONCHOSCOPY (Left)  Patient Location: PACU  Anesthesia Type:General  Level of Consciousness: awake  Airway & Oxygen Therapy: Patient Spontanous Breathing and Patient connected to face mask oxygen  Post-op Assessment: Report given to RN and Post -op Vital signs reviewed and stable  Post vital signs: Reviewed and stable  Last Vitals:  Vitals Value Taken Time  BP 156/79   Temp 35.9   Pulse 98 03/14/21 1441  Resp 24 03/14/21 1441  SpO2 94 % 03/14/21 1441  Vitals shown include unvalidated device data.  Last Pain:  Vitals:   03/14/21 1127  TempSrc: Temporal  PainSc: 0-No pain         Complications: No notable events documented.

## 2021-03-14 NOTE — Discharge Instructions (Signed)

## 2021-03-14 NOTE — Anesthesia Procedure Notes (Signed)
Procedure Name: Intubation Date/Time: 03/14/2021 12:53 PM Performed by: Cammie Sickle, CRNA Pre-anesthesia Checklist: Patient identified, Emergency Drugs available, Suction available, Patient being monitored and Timeout performed Patient Re-evaluated:Patient Re-evaluated prior to induction Oxygen Delivery Method: Circle system utilized Preoxygenation: Pre-oxygenation with 100% oxygen Induction Type: IV induction Ventilation: Mask ventilation without difficulty Laryngoscope Size: McGraph and 3 Grade View: Grade I Tube type: Oral Tube size: 9.0 (Requested by proceduralist) mm Number of attempts: 1 Airway Equipment and Method: Stylet Placement Confirmation: ETT inserted through vocal cords under direct vision, positive ETCO2 and breath sounds checked- equal and bilateral Secured at: 18 (requested by proceduralist) cm Tube secured with: Tape Dental Injury: Teeth and Oropharynx as per pre-operative assessment  Comments: No anesthetic complications noted, intubated without difficulty.

## 2021-03-15 LAB — SURGICAL PATHOLOGY

## 2021-03-15 LAB — CYTOLOGY - NON PAP

## 2021-03-15 NOTE — Anesthesia Postprocedure Evaluation (Signed)
Anesthesia Post Note  Patient: Tanya [name]  Procedure(s) Performed: ROBOTIC ASSISTED NAVIGATIONAL BRONCHOSCOPY (Left)  Patient location during evaluation: PACU Anesthesia Type: General Level of consciousness: awake and alert Pain management: pain level controlled Vital Signs Assessment: post-procedure vital signs reviewed and stable Respiratory status: spontaneous breathing, nonlabored ventilation and respiratory function stable Cardiovascular status: blood pressure returned to baseline and stable Postop Assessment: no apparent nausea or vomiting Anesthetic complications: no   No notable events documented.   Last Vitals:  Vitals:   03/14/21 1530 03/14/21 1541  BP: 112/82 111/69  Pulse: 87 84  Resp: 18 18  Temp: 36.6 C   SpO2: 96% 97%    Last Pain:  Vitals:   03/14/21 1541  TempSrc:   PainSc: 0-No pain                 [place] Ouch

## 2021-03-16 ENCOUNTER — Other Ambulatory Visit: Payer: Self-pay

## 2021-03-16 MED ORDER — PREDNISONE 10 MG (21) PO TBPK: ORAL_TABLET | ORAL | 0 refills | Status: DC

## 2021-03-16 MED ORDER — PREDNISONE 10 MG PO TABS: ORAL_TABLET | ORAL | 0 refills | Status: DC

## 2021-03-19 ENCOUNTER — Encounter: Payer: Self-pay | Admitting: Pulmonary Disease

## 2021-03-19 NOTE — Telephone Encounter (Signed)
Dr. Gonzalez, please advise. thanks 

## 2021-03-21 MED ORDER — ALBUTEROL SULFATE HFA 108 (90 BASE) MCG/ACT IN AERS: 2.0000 | INHALATION_SPRAY | Freq: Four times a day (QID) | RESPIRATORY_TRACT | 2 refills | Status: DC | PRN

## 2021-03-21 NOTE — Telephone Encounter (Signed)
Lets start with some albuterol 2 puffs 4 times a day as needed.  When she follows up I will probably start her on either some Trelegy or Breztri as her PFTs did show that she could probably benefit from 1 of these.  But we will assess this when she follows up. ?

## 2021-03-22 ENCOUNTER — Other Ambulatory Visit: Payer: Self-pay | Admitting: Pulmonary Disease

## 2021-03-23 ENCOUNTER — Ambulatory Visit (INDEPENDENT_AMBULATORY_CARE_PROVIDER_SITE_OTHER): Payer: Medicaid Other | Admitting: Psychiatry

## 2021-03-23 ENCOUNTER — Other Ambulatory Visit: Payer: Self-pay

## 2021-03-23 ENCOUNTER — Encounter: Payer: Self-pay | Admitting: Psychiatry

## 2021-03-23 VITALS — BP 131/70 | HR 71 | Temp 97.6°F | Wt 152.0 lb

## 2021-03-23 DIAGNOSIS — F411 Generalized anxiety disorder: Secondary | ICD-10-CM | POA: Diagnosis not present

## 2021-03-23 DIAGNOSIS — F17200 Nicotine dependence, unspecified, uncomplicated: Secondary | ICD-10-CM

## 2021-03-23 DIAGNOSIS — F331 Major depressive disorder, recurrent, moderate: Secondary | ICD-10-CM

## 2021-03-23 DIAGNOSIS — F5101 Primary insomnia: Secondary | ICD-10-CM

## 2021-03-23 MED ORDER — DOXEPIN HCL 10 MG PO CAPS: 10.0000 mg | ORAL_CAPSULE | Freq: Every evening | ORAL | 2 refills | Status: DC | PRN

## 2021-03-23 NOTE — Progress Notes (Signed)
[facility] MD OP Progress Note  03/23/2021 12:15 PM Tanya [name]  MRN:  [id number]  Chief Complaint:  Chief Complaint  Patient presents with   Follow-up: 61 year old Caucasian female with history of generalized anxiety disorder, MDD, insomnia, presented for medication management.   HPI: Tanya [name] is a 61 year old Caucasian female on SSD, separated from husband, lives in [place] with her daughter, has a history of GAD, MDD, insomnia, chronic pain, fibromyalgia, osteoarthritis, SLE was evaluated in office today.  Patient today reports she continues to have anxiety which is mostly situational related to her son who has substance abuse problems.  Patient reports that does worry her on a regular basis.  She reports she is aware that she needs to separate herself from this however it is hard to do it.  She does have upcoming appointment with psychotherapist and agrees to keep it.  She also struggles with sadness, concentration problems, low motivation.  Patient reports she had to stop taking the mirtazapine.  She reports it caused her stiffness of her upper and lower extremities.  Patient reports since stopping it the stiffness resolved.  She continues to struggle with sleep.  She does not have a good sleep hygiene and agrees to work on it.  Does use a lot of caffeine agrees to cut back.  Patient denies any suicidality, homicidality or perceptual disturbances.  Currently compliant on Cymbalta and BuSpar.  Wants to stay on this combination of medications.  Patient denies any other concerns today.  Visit Diagnosis:    ICD-10-CM   1. Generalized anxiety disorder  F41.1     2. Moderate episode of recurrent major depressive disorder (HCC)  F33.1     3. Primary insomnia  F51.01 doxepin (SINEQUAN) 10 MG capsule    4. Tobacco use disorder  F17.200       Past Psychiatric History: Reviewed past psychiatric history from progress note on 02/06/2021.  Past trials of Cymbalta-higher dosage could not  tolerate, trazodone-had side effects, Wellbutrin-side effects, Valium, doxepin, Trintellix, mirtazapine.  Past Medical History:  Past Medical History:  Diagnosis Date   Anxiety    Bladder infection    8/18   Chronic lower back pain    Chronic neck pain    Collagen vascular disease ([place])    COVID-19    Depression    Diverticulitis    GERD (gastroesophageal reflux disease)    Hyperlipidemia    Lupus (HCC)    Overactive bladder     Past Surgical History:  Procedure Laterality Date   ABDOMINAL HYSTERECTOMY     COLONOSCOPY     COLONOSCOPY WITH PROPOFOL N/A 07/03/2017   Procedure: COLONOSCOPY WITH PROPOFOL;  Surgeon: [name], MD;  Location: [facility] ENDOSCOPY;  Service: Endoscopy;  Laterality: N/A;   COLONOSCOPY WITH PROPOFOL N/A 10/21/2017   Procedure: COLONOSCOPY WITH PROPOFOL;  Surgeon: [name], MD;  Location[facility] ENDOSCOPY;  Service: Endoscopy;  Laterality: N/A;   ESOPHAGOGASTRODUODENOSCOPY N/A 10/21/2017   Procedure: ESOPHAGOGASTRODUODENOSCOPY (EGD);  Surgeon: [name], MD;  Location: [facility] ENDOSCOPY;  Service: Endoscopy;  Laterality: N/A;   TONSILLECTOMY      Family Psychiatric History: Reviewed family psychiatric history from progress note on 02/06/2021.  Family History:  Family History  Problem Relation Age of Onset   Depression Mother    Alcohol abuse Mother    Cancer Mother    Hypertension Mother    Emphysema Mother    Stroke Mother    Glaucoma Father    Heart disease  Father    Diabetes Brother    Drug abuse Son     Social History: Reviewed social history from progress note on 02/06/2021. Social History   Socioeconomic History   Marital status: Married    Spouse name: Not on file   Number of children: Not on file   Years of education: Not on file   Highest education level: Not on file  Occupational History   Not on file  Tobacco Use   Smoking status: Every Day    Packs/day: 1.00    Years: 35.00    Pack years: 35.00    Types:  Cigarettes    Passive exposure: Past   Smokeless tobacco: Never   Tobacco comments:    1ppd - 03/01/2021  Vaping Use   Vaping Use: Never used  Substance and Sexual Activity   Alcohol use: No   Drug use: No   Sexual activity: Not Currently  Other Topics Concern   Not on file  Social History Narrative   Not on file   Social Determinants of Health   Financial Resource Strain: Not on file  Food Insecurity: Not on file  Transportation Needs: Not on file  Physical Activity: Not on file  Stress: Not on file  Social Connections: Not on file    Allergies:  Allergies  Allergen Reactions   Pregabalin Other (See Comments)    unknown   Trazodone Other (See Comments)    Agitation   Bupropion Anxiety   Methocarbamol Other (See Comments)    Agitation and stiffness   Tramadol Other (See Comments)    Nausea And Vomiting Reports she feels "high"    Metabolic Disorder Labs: No results found for: HGBA1C, MPG No results found for: PROLACTIN No results found for: CHOL, TRIG, HDL, CHOLHDL, VLDL, LDLCALC Lab Results  Component Value Date   TSH 3.347 02/07/2021    Therapeutic Level Labs: No results found for: LITHIUM No results found for: VALPROATE No components found for:  CBMZ  Current Medications: Current Outpatient Medications  Medication Sig Dispense Refill   albuterol (VENTOLIN HFA) 108 (90 Base) MCG/ACT inhaler Inhale 2 puffs into the lungs every 6 (six) hours as needed for wheezing or shortness of breath. 8 g 2   atorvastatin (LIPITOR) 80 MG tablet Take 80 mg by mouth every evening.     busPIRone (BUSPAR) 15 MG tablet Take 1 tablet (15 mg total) by mouth daily with breakfast. Dose reduction 90 tablet 0   doxepin (SINEQUAN) 10 MG capsule Take 1 capsule (10 mg total) by mouth at bedtime as needed. For sleep 15 capsule 2   DULoxetine (CYMBALTA) 30 MG capsule Take 1 capsule (30 mg total) by mouth daily. 90 capsule 0   gabapentin (NEURONTIN) 300 MG capsule Take 1 capsule (300 mg  total) by mouth 3 (three) times daily. 90 capsule 2   hydroxychloroquine (PLAQUENIL) 200 MG tablet Take 200 mg by mouth in the morning.     oxyCODONE (OXY IR/ROXICODONE) 5 MG immediate release tablet Take 1 tablet (5 mg total) by mouth 2 (two) times daily. Must last 30 days 60 tablet 0   [START ON 04/07/2021] oxyCODONE (OXY IR/ROXICODONE) 5 MG immediate release tablet Take 1 tablet (5 mg total) by mouth 2 (two) times daily. Must last 30 days 60 tablet 0   pantoprazole (PROTONIX) 40 MG tablet Take 40 mg by mouth in the morning.     predniSONE (DELTASONE) 10 MG tablet 10mg  daily until f/u after taper 30 tablet 0  predniSONE (STERAPRED UNI-PAK 21 TAB) 10 MG (21) TBPK tablet Use as directed 21 tablet 0   oxyCODONE (OXY IR/ROXICODONE) 5 MG immediate release tablet Take 1 tablet (5 mg total) by mouth 2 (two) times daily. Must last 30 days (Patient taking differently: Take 5 mg by mouth in the morning, at noon, and at bedtime. Must last 30 days) 60 tablet 0   No current facility-administered medications for this visit.   Facility-Administered Medications Ordered in Other Visits  Medication Dose Route Frequency Provider Last Rate Last Admin   albuterol (PROVENTIL) (2.5 MG/3ML) 0.083% nebulizer solution 2.5 mg  2.5 mg Nebulization Once Tyler Pita, MD         Musculoskeletal: Strength & Muscle Tone: within normal limits Gait & Station: normal Patient leans: N/A  Psychiatric Specialty Exam: Review of Systems  Psychiatric/Behavioral:  Positive for dysphoric mood and sleep disturbance. The patient is nervous/anxious.   All other systems reviewed and are negative.  Blood pressure 131/70, pulse 71, temperature 97.6 F (36.4 C), temperature source Temporal, weight 152 lb (68.9 kg).Body mass index is 26.93 kg/m.  General Appearance: Casual  Eye Contact:  Fair  Speech:  Clear and Coherent  Volume:  Normal  Mood:  Anxious and Dysphoric  Affect:  Congruent  Thought Process:  Goal Directed and  Descriptions of Associations: Intact  Orientation:  Full (Time, Place, and Person)  Thought Content: Logical   Suicidal Thoughts:  No  Homicidal Thoughts:  No  Memory:  Immediate;   Fair Recent;   Fair Remote;   Fair  Judgement:  Fair  Insight:  Fair  Psychomotor Activity:  Normal  Concentration:  Concentration: Fair and Attention Span: Fair  Recall:  [employer] of Knowledge: Fair  Language: Fair  Akathisia:  No  Handed:  Right  AIMS (if indicated): done  Assets:  Communication Skills Desire for Improvement Housing Social Support  ADL's:  Intact  Cognition: WNL  Sleep:  Poor   Screenings: GAD-7    Flowsheet Row Office Visit from 02/06/2021 in [facility]  Total GAD-7 Score 19      PHQ2-9    [facility] Visit from 03/23/2021 in [facility] Office Visit from 03/06/2021 in [facility] Office Visit from 02/06/2021 in [facility] Procedure visit from 04/13/2020 in [facility] Office Visit from 04/03/2020 in [facility]  PHQ-2 Total Score 1 3 3  0 0  PHQ-9 Total Score 5 12 10  -- --      [facility] Office Visit from 03/23/2021 in [facility] from 03/08/2021 in [facility] Office Visit from 03/06/2021 in [facility] No Risk No Risk Low Risk        Assessment and Plan: Tanya [name] is a 61 year old Caucasian female on disability, separated, lives in [place] with her daughter, has a history of depression, anxiety, chronic pain, GERD, SLE, chronic discoid lupus, fibromyalgia, hyperlipidemia, was evaluated in office today.  Patient is currently struggling with psychosocial stressors as noted above, continues to have sleep  problems, adverse side effects to mirtazapine as noted above.  Discussed plan as noted below.  Plan  GAD-unstable Cymbalta 30 mg p.o. daily BuSpar 15 mg p.o. daily-reduced dosage Patient is not interested in changing the dosages of her medications or adding an SSRI or SNRI at this  time. Patient referred for CBT.  Encouraged to keep it.  MDD-unstable Cymbalta 30 mg p.o. daily Refer for CBT  Primary insomnia-unstable Discussed sleep hygiene techniques. Start doxepin 10 mg p.o. nightly Discontinue mirtazapine.  Tobacco use disorder-unstable Provided counseling for 2 minutes.  Follow-up in clinic in 6 weeks or sooner in person.  Collaboration of Care: Collaboration of Care: Referral or follow-up with counselor/therapist AEB patient encouraged to keep appointment with therapist--04/16/2021.  Patient/Guardian was advised Release of Information must be obtained prior to any record release in order to collaborate their care with an outside provider. Patient/Guardian was advised if they have not already done so to contact the registration department to sign all necessary forms in order for [place] to release information regarding their care.   Consent: Patient/Guardian gives verbal consent for treatment and assignment of benefits for services provided during this visit. Patient/Guardian expressed understanding and agreed to proceed.   This note was generated in part or whole with voice recognition software. Voice recognition is usually quite accurate but there are transcription errors that can and very often do occur. I apologize for any typographical errors that were not detected and corrected.      Ursula Alert, MD 03/23/2021, 12:15 PM

## 2021-03-23 NOTE — Patient Instructions (Signed)
Doxepin Capsules ?What is this medication? ?DOXEPIN (DOX e pin) treats depression and anxiety. It increases the amount of serotonin and norepinephrine in the brain, hormones that help regulate mood. It belongs to a group of medications called tricyclic antidepressants (TCAs). ?This medicine may be used for other purposes; ask your health care provider or pharmacist if you have questions. ?COMMON BRAND NAME(S): Sinequan ?What should I tell my care team before I take this medication? ?They need to know if you have any of these conditions: ?Bipolar disorder ?Difficulty passing urine ?Glaucoma ?Heart disease ?If you frequently drink alcohol containing drinks ?Liver disease ?Lung or breathing disease, like asthma or sleep apnea ?Prostate trouble ?Schizophrenia ?Seizures ?Suicidal thoughts, plans, or attempt; a previous suicide attempt by you or a family member ?An unusual or allergic reaction to doxepin, other medications, foods, dyes, or preservatives ?Pregnant or trying to get pregnant ?Breast-feeding ?How should I use this medication? ?Take this medication by mouth with a glass of water. Follow the directions on the prescription label. Take your doses at regular intervals. Do not take your medication more often than directed. Do not stop taking this medication suddenly except upon the advice of your care team. Stopping this medication too quickly may cause serious side effects or your condition may worsen. ?A special MedGuide will be given to you by the pharmacist with each prescription and refill. Be sure to read this information carefully each time. ?Talk to your care team about the use of this medication in children. While this medication may be prescribed for children as young as 12 years for selected conditions, precautions do apply. ?Overdosage: If you think you have taken too much of this medicine contact a poison control center or emergency room at once. ?NOTE: This medicine is only for you. Do not share this  medicine with others. ?What if I miss a dose? ?If you miss a dose, take it as soon as you can. If it is almost time for your next dose, take only that dose. Do not take double or extra doses. ?What may interact with this medication? ?Do not take this medication with any of the following: ?Arsenic trioxide ?Certain medications used to regulate abnormal heartbeat or to treat other heart conditions ?Cisapride ?Halofantrine ?Levomethadyl ?Linezolid ?MAOIs like Carbex, Eldepryl, Marplan, Nardil, and Parnate ?Methylene blue ?Other medications for mental depression ?Phenothiazines like perphenazine, thioridazine and chlorpromazine ?Pimozide ?Procarbazine ?Sparfloxacin ?[place] ?This medication may also interact with the following: ?Cimetidine ?Tolazamide ?Ziprasidone ?This list may not describe all possible interactions. Give your health care provider a list of all the medicines, herbs, non-prescription drugs, or dietary supplements you use. Also tell them if you smoke, drink alcohol, or use illegal drugs. Some items may interact with your medicine. ?What should I watch for while using this medication? ?Visit your care team for regular checks on your progress. It can take several days before you feel the full effect of this medication. If you have been taking this medication regularly for some time, do not suddenly stop taking it. You must gradually reduce the dose or you may get severe side effects. Ask your care team for advice. Even after you stop taking this medication it can still affect your body for several days. ?Patients and their families should watch out for new or worsening thoughts of suicide or depression. Also watch out for sudden changes in feelings such as feeling anxious, agitated, panicky, irritable, hostile, aggressive, impulsive, severely restless, overly excited and hyperactive, or not being able  to sleep. If this happens, especially at the beginning of treatment or after a change in dose,  call your care team. ?You may get drowsy or dizzy. Do not drive, use machinery, or do anything that needs mental alertness until you know how this medication affects you. Do not stand or sit up quickly, especially if you are an older patient. This reduces the risk of dizzy or fainting spells. Alcohol may increase dizziness and drowsiness. Avoid alcoholic drinks. ?Do not treat yourself for coughs, colds, or allergies without asking your care team for advice. Some ingredients can increase possible side effects. ?Your mouth may get dry. Chewing sugarless gum or sucking hard candy, and drinking plenty of water may help. Contact your care team if the problem does not go away or is severe. ?This medication may cause dry eyes and blurred vision. If you wear contact lenses you may feel some discomfort. Lubricating drops may help. See your care team if the problem does not go away or is severe. ?This medication can make you more sensitive to the sun. Keep out of the sun. If you cannot avoid being in the sun, wear protective clothing and use sunscreen. Do not use sun lamps or tanning beds/booths. ?What side effects may I notice from receiving this medication? ?Side effects that you should report to your care team as soon as possible: ?Allergic reactions--skin rash, itching, hives, swelling of the face, lips, tongue, or throat ?Irritability, confusion, fast or irregular heartbeat, muscle stiffness, twitching muscles, sweating, high fever, seizure, chills, vomiting, diarrhea, which may be signs of serotonin syndrome ?Sudden eye pain or change in vision such as blurry vision, seeing halos around lights, vision loss ?Thoughts of suicide or self-harm, worsening mood, or feelings of depression ?Trouble passing urine ?Side effects that usually do not require medical attention (report to your care team if they continue or are bothersome): ?Change in sex drive or performance ?Constipation ?Dizziness ?Drowsiness ?Dry  mouth ?Tremors ?Weight gain ?This list may not describe all possible side effects. Call your doctor for medical advice about side effects. You may report side effects to FDA at 1-800-FDA-1088. ?Where should I keep my medication? ?Keep out of the reach of children. ?Store at room temperature between 15 and 30 degrees C (59 and 86 degrees F). Throw away any unused medication after the expiration date. ?NOTE: This sheet is a summary. It may not cover all possible information. If you have questions about this medicine, talk to your doctor, pharmacist, or health care provider. ?? 2022 Elsevier/Gold Standard (2020-04-13 00:00:00) ? ?

## 2021-04-06 ENCOUNTER — Other Ambulatory Visit: Payer: Self-pay | Admitting: Pain Medicine

## 2021-04-06 DIAGNOSIS — Z79899 Other long term (current) drug therapy: Secondary | ICD-10-CM

## 2021-04-06 DIAGNOSIS — Z79891 Long term (current) use of opiate analgesic: Secondary | ICD-10-CM

## 2021-04-06 DIAGNOSIS — M542 Cervicalgia: Secondary | ICD-10-CM

## 2021-04-06 DIAGNOSIS — G8929 Other chronic pain: Secondary | ICD-10-CM

## 2021-04-06 DIAGNOSIS — G894 Chronic pain syndrome: Secondary | ICD-10-CM

## 2021-04-06 DIAGNOSIS — M47812 Spondylosis without myelopathy or radiculopathy, cervical region: Secondary | ICD-10-CM

## 2021-04-06 DIAGNOSIS — M545 Low back pain, unspecified: Secondary | ICD-10-CM

## 2021-04-16 ENCOUNTER — Ambulatory Visit (INDEPENDENT_AMBULATORY_CARE_PROVIDER_SITE_OTHER): Payer: Medicaid Other | Admitting: Licensed Clinical Social Worker

## 2021-04-16 ENCOUNTER — Other Ambulatory Visit: Payer: Self-pay

## 2021-04-16 DIAGNOSIS — F331 Major depressive disorder, recurrent, moderate: Secondary | ICD-10-CM | POA: Diagnosis not present

## 2021-04-16 DIAGNOSIS — F411 Generalized anxiety disorder: Secondary | ICD-10-CM

## 2021-04-16 NOTE — Progress Notes (Signed)
Virtual Visit via Video Note ? ?I connected with Tanya [name] on 04/17/21 at 10:00 AM EDT by a video enabled telemedicine application and verified that I am speaking with the correct person using two identifiers. ? ?Location: ?Patient: home ?Provider: remote office [place], [place]) ?  ?I discussed the limitations of evaluation and management by telemedicine and the availability of in person appointments. The patient expressed understanding and agreed to proceed. ?  ?I discussed the assessment and treatment plan with the patient. The patient was provided an opportunity to ask questions and all were answered. The patient agreed with the plan and demonstrated an understanding of the instructions. ?  ?The patient was advised to call back or seek an in-person evaluation if the symptoms worsen or if the condition fails to improve as anticipated. ? ?I provided 60 minutes of non-face-to-face time during this encounter. ? ? ?Everli Rother R Meira Wahba, LCSW ? ?Comprehensive Clinical Assessment (CCA) Note ? ?04/17/2021 ?Tanya [name] ?[id number] ? ?Chief Complaint:  ?Chief Complaint  ?Patient presents with  ? Establish Care  ? Anxiety  ? ?Visit Diagnosis:  ?Encounter Diagnoses  ?Name Primary?  ? Moderate episode of recurrent major depressive disorder (Tanya [name]) Yes  ? Generalized anxiety disorder   ? ? ? ? ?CCA Screening, Triage and Referral (STR) ? ?Patient Reported Information ?How did you hear about [place]? No data recorded ?Referral name: Dr. Shea Evans ? ?Referral phone number: No data recorded ? ?Whom do you see for routine medical problems? No data recorded ?Practice/Facility Name: No data recorded ?Practice/Facility Phone Number: No data recorded ?Name of Contact: No data recorded ?Contact Number: No data recorded ?Contact Fax Number: No data recorded ?Prescriber Name: No data recorded ?Prescriber Address (if known): No data recorded ? ?What Is the Reason for Your Visit/Call Today? Tanya [name] is a 61 yo female reporting to [facility]  virtually for establishment of psychotherapy services. Tanya [name] is currently taking cymbalta to manage symptoms. Pt reports that she has never had a psychiatric inpatient hospitalization in the past. Pt states she has had counseling through [facility] in the past and that it was helpful. Pt states that she has Lupus and that she suffers from chronic pain due to the lupus. ? ?How Long Has This Been Causing You Problems? > than 6 months ? ?What Do You Feel Would Help You the Most Today? Treatment for Depression or other mood problem ? ? ?Have You Recently Been in Any Inpatient Treatment (Hospital/Detox/Crisis Center/28-Day Program)? No ? ?Name/Location of Program/Hospital:No data recorded ?How Long Were You There? No data recorded ?When Were You Discharged? No data recorded ? ?Have You Ever Received Services From [facility] Before? Yes ? ?Who Do You See at [facility]? No data recorded ? ?Have You Recently Had Any Thoughts About Hurting Yourself? No ? ?Are You Planning to Commit Suicide/Harm Yourself At This time? No ? ? ?Have you Recently Had Thoughts About [place]? No ? ?Explanation: No data recorded ? ?Have You Used Any Alcohol or Drugs in the Past 24 Hours? No ? ?How Long Ago Did You Use Drugs or Alcohol? No data recorded ?What Did You Use and How Much? No data recorded ? ?Do You Currently Have a Therapist/Psychiatrist? Yes ? ?Name of Therapist/Psychiatrist: Dr. Shea Evans ? ? ?Have You Been Recently Discharged From Any Office Practice or Programs? No ? ?Explanation of Discharge From Practice/Program: No data recorded ? ?  ?CCA Screening Triage Referral Assessment ?Type of Contact: Tele-Assessment ? ?Is this Initial or Reassessment? Initial Assessment ? ?  Date Telepsych consult ordered in [facility]:  No data recorded ?Time Telepsych consult ordered in [facility]:  No data recorded ? ?Patient Reported Information Reviewed? No data recorded ?Patient Left Without Being Seen? No data recorded ?Reason for Not Completing  Assessment: No data recorded ? ?Collateral Involvement: none ? ? ?Does Patient Have a Stage manager Guardian? No data recorded ?Name and Contact of Legal Guardian: No data recorded ?If Minor and Not Living with Parent(s), Who has Custody? n/a ? ?Is CPS involved or ever been involved? Never ? ?Is APS involved or ever been involved? Never ? ? ?Patient Determined To Be At Risk for Harm To Self or Others Based on Review of Patient Reported Information or Presenting Complaint? No ? ?Method: No data recorded ?Availability of Means: No data recorded ?Intent: No data recorded ?Notification Required: No data recorded ?Additional Information for Danger to Others Potential: No data recorded ?Additional Comments for Danger to Others Potential: No data recorded ?Are There Guns or Other Weapons in [place]? No data recorded ?Types of Guns/Weapons: No data recorded ?Are These Weapons Safely Secured?                            No data recorded ?Who Could Verify You Are Able To Have These Secured: No data recorded ?Do You Have any Outstanding Charges, Pending Court Dates, Parole/Probation? No data recorded ?Contacted To Inform of Risk of Harm To Self or Others: Other: Comment (n/a) ? ? ?Location of Assessment: No data recorded ? ?Does Patient Present under Involuntary Commitment? No ? ?IVC Papers Initial File Date: No data recorded ? ?[place] of Residence: [place] ? ? ?Patient Currently Receiving the Following Services: Medication Management ? ? ?Determination of Need: Routine (7 days) ? ? ?Options For Referral: Medication Management; Outpatient Therapy ? ? ? ? ?CCA Biopsychosocial ?Intake/Chief Complaint:  Tanya [name] is a 61 yo female reporting to [facility] virtually for establishment of psychotherapy services. Tanya [name] is currently taking cymbalta to manage symptoms. Pt reports that she has never had a psychiatric inpatient hospitalization in the past. Pt states she has had counseling through [facility] in the past and that it was helpful.  Pt states that she has Lupus and that she suffers from chronic pain due to the lupus ? ?Current Symptoms/Problems: anxiety, insomnia, mood changes ? ? ?Patient Reported Schizophrenia/Schizoaffective Diagnosis in Past: No ? ? ?Strengths: good levels of self awareness ? ?Preferences: outpatient psychiatric supports ? ?Abilities: No data recorded ? ?Type of Services Patient Feels are Needed: medication management; psychotherapy ? ? ?Initial Clinical Notes/Concerns: No data recorded ? ?Mental Health Symptoms ?Depression:  Difficulty Concentrating; Fatigue; Weight gain/loss; Tearfulness; Sleep (too much or little); Irritability; Change in energy/activity ?  ?Duration of Depressive symptoms: Greater than two weeks ?  ?Mania:  Change in energy/activity ?  ?Anxiety:   Worrying; Tension; Sleep; Restlessness; Difficulty concentrating ?  ?Psychosis:  Hallucinations ("I see shadows sometimes and I think i hear someone calling my name sometimes") ?  ?Duration of Psychotic symptoms: Greater than six months ?  ?Trauma:  N/A ?  ?Obsessions:  None ?  ?Compulsions:  None ?  ?Inattention:  N/A ?  ?Hyperactivity/Impulsivity:  N/A ?  ?Oppositional/Defiant Behaviors:  N/A ?  ?Emotional Irregularity:  None ?  ?Other Mood/Personality Symptoms:  No data recorded  ? ?Mental Status Exam ?Appearance and self-care  ?Stature:  Average ?  ?Weight:  Average weight ?  ?Clothing:  Neat/clean ?  ?Grooming:  Normal ?  ?  Cosmetic use:  None ?  ?Posture/gait:  Normal ?  ?Motor activity:  Not Remarkable ?  ?Sensorium  ?Attention:  Normal ?  ?Concentration:  Normal ?  ?Orientation:  X5 ?  ?Recall/memory:  Normal ?  ?Affect and Mood  ?Affect:  Anxious ?  ?Mood:  Anxious ?  ?Relating  ?Eye contact:  Normal ?  ?Facial expression:  Anxious ?  ?Attitude toward examiner:  Cooperative ?  ?Thought and Language  ?Speech flow: Clear and Coherent ?  ?Thought content:  Appropriate to Mood and Circumstances ?  ?Preoccupation:  None ?  ?Hallucinations:  Auditory;  Visual ?  ?Organization:  No data recorded  ?Executive Functions  ?Fund of Knowledge:  Good ?  ?Intelligence:  Average ?  ?Abstraction:  Normal ?  ?Judgement:  Normal ?  ?Reality Testing:  Realistic ?  ?Insight:  Good ?  ?Tennis Must

## 2021-04-17 NOTE — Plan of Care (Signed)
Developed tx plan utilizing pt input ?

## 2021-04-18 ENCOUNTER — Telehealth: Payer: Self-pay

## 2021-04-18 MED ORDER — BUSPIRONE HCL 10 MG PO TABS: 10.0000 mg | ORAL_TABLET | Freq: Every day | ORAL | 0 refills | Status: DC

## 2021-04-18 NOTE — Telephone Encounter (Signed)
This is Dr. Charlcie Cradle patient. Pt called regarding her Doxepin '10mg'$ . She stated that she has taken the first 15 (it was sent in on 03/23/21 #15 w/ 2 refills) and feels agitated and feeling bad. Pt stated that she's feeling sad and having crying spells. She stated that everything's getting to her and she stays in her house, doesn't want to be around anyone. Pt stated that the Doxepin isn't working. Please review and advise.  Thank you ?

## 2021-04-18 NOTE — Telephone Encounter (Signed)
Spoke with patient over the phone. She reports that she has discontinued doxepin.  She reports that doxepin is not for her.  She reports that she has been feeling more sad, having low energy and asked if anything else can be tried.  She reports that she discontinued doxepin last week.  I discussed option of increasing Cymbalta which she does not agree with.  Discussed trialing BuSpar 10 mg at night in addition to 15 mg in the morning which can help with depression.  She verbalized understanding.  She will call back for any other concerns.

## 2021-04-19 ENCOUNTER — Ambulatory Visit: Payer: Medicaid Other | Admitting: Pulmonary Disease

## 2021-04-19 ENCOUNTER — Encounter: Payer: Self-pay | Admitting: Pulmonary Disease

## 2021-04-19 VITALS — BP 120/80 | HR 73 | Temp 98.0°F | Ht 63.0 in | Wt 154.4 lb

## 2021-04-19 DIAGNOSIS — J449 Chronic obstructive pulmonary disease, unspecified: Secondary | ICD-10-CM | POA: Diagnosis not present

## 2021-04-19 DIAGNOSIS — R911 Solitary pulmonary nodule: Secondary | ICD-10-CM

## 2021-04-19 DIAGNOSIS — M329 Systemic lupus erythematosus, unspecified: Secondary | ICD-10-CM

## 2021-04-19 DIAGNOSIS — F17210 Nicotine dependence, cigarettes, uncomplicated: Secondary | ICD-10-CM | POA: Diagnosis not present

## 2021-04-19 MED ORDER — FLUTICASONE FUROATE-VILANTEROL 100-25 MCG/ACT IN AEPB
1.0000 | INHALATION_SPRAY | Freq: Every day | RESPIRATORY_TRACT | 0 refills | Status: DC
Start: 2021-04-19 — End: 2021-05-02

## 2021-04-19 NOTE — Progress Notes (Signed)
Subjective:    Patient ID: Tanya [name], female    DOB: 16-Dec-1960, 61 y.o.   MRN: [id number] Patient Care Team: Leonel Ramsay, MD as PCP - General (Infectious Diseases)  Chief Complaint  Patient presents with   Follow-up   HPI Tanya [name] is a 61 year old very complex patient, current smoker, with connective tissue disease who presents for follow-up after robotic assisted navigational bronchoscopy performed 14 March 2021.  Unfortunately the sampling was nondiagnostic.  The patient was treated for potential infection with prednisone and antibiotics and felt better after that.  We have discussed that this lesion will need to be continued to be followed.  And that she may very well need rebiopsy due to the nature of the lesion which is subsolid.  She tolerated the procedure well.   She had pulmonary function testing on 13 March 2021 that showed FEV1 of 1.64 L or 65% predicted, FVC of 2.23 L or 69%, FEV1/FVC 73% predicted, there is significant bronchodilator response.  Lung volumes mildly reduced.  Diffusion capacity mildly reduced.  This is consistent with combined obstructive/restrictive physiology.  Is mild.   Review of Systems A 10 point review of systems was performed and it is as noted above otherwise negative.  Patient Active Problem List   Diagnosis Date Noted   Moderate episode of recurrent major depressive disorder ([place]) 03/23/2021   Tobacco use disorder 03/23/2021   Insomnia 02/06/2021   At risk for prolonged QT interval syndrome 02/06/2021   Psychiatric illness 11/01/2020   Chronic use of opiate for therapeutic purpose 06/25/2020   Arthropathy of cervical facet joint (Bilateral) 04/13/2020   Cervical spine pain 04/13/2020   Abnormal MRI, cervical spine (09/20/2015) 04/13/2020   Cervical central spinal stenosis (Multilevel) 04/13/2020   Cervical foraminal stenosis (Right: C3-4) (Bilateral: C4-5, C5-6) 04/13/2020   DDD (degenerative disc disease), cervical  03/14/2020   Chronic shoulder pain (Left) 09/06/2019   Osteoarthritis of shoulder (Left) 09/06/2019   Osteoarthritis of shoulders (Bilateral) (R>L) 10/25/2018   Osteoarthritis of AC (acromioclavicular) joint (Right) 04/02/2018   Chronic acromioclavicular joint pain (Right) 04/02/2018   Chronic shoulder pain (Right) 04/02/2018   Epicondylitis elbow, medial (Left) 04/02/2018   Chronic elbow pain (Left) 03/25/2018   Occipital headache (Left) 12/10/2017   Neurogenic pain 12/10/2017   Cervico-occipital neuralgia (Left) 12/10/2017   Pharmacologic therapy 09/01/2017   Disorder of skeletal system 09/01/2017   Problems influencing health status 09/01/2017   Spondylosis without myelopathy or radiculopathy, cervical region 08/05/2017   Cervicalgia 08/05/2017   Rash 05/13/2017   Acute postoperative pain 06/26/2016   Cervical spondylosis 04/15/2016   Cervical facet syndrome (Bilateral) (R>L) 04/15/2016   Shoulder radicular pain (Bilateral) (R>L) 04/15/2016   Chronic musculoskeletal pain 03/07/2016   Vitamin D insufficiency 12/19/2015   Anxiety 11/23/2015   Chronic low back pain (3ry area of Pain) (Bilateral) (R>L) w/o sciatica 11/23/2015   Chronic neck pain (1ry area of Pain) (Bilateral) (R>L) 11/23/2015   Long term prescription benzodiazepine use 11/23/2015   Long term current use of opiate analgesic 11/23/2015   Long term prescription opiate use 11/23/2015   Opiate use (10 MME/Day) 11/23/2015   Chronic pain syndrome 11/23/2015   Chronic upper extremity pain (Right) 11/23/2015   Chronic cervical radicular pain (Right) 11/23/2015   Chronic shoulder pain (Bilateral) (R>L) 11/23/2015   Hypercholesterolemia 06/20/2015   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   Encounter for long-term (current) use of high-risk medication 06/06/2015   Pain medication agreement signed 11/24/2014   Hyperlipidemia 01/25/2014  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   Diverticulitis 10/04/2012   Osteoarthritis  10/04/2012   Chronic upper back pain (2ry area of Pain) (Bilateral) (R>L) 08/17/2012   Urinary incontinence 08/17/2012   Fibromyalgia 06/04/2012   SLE (systemic lupus erythematosus) ([place]) 02/18/2012   Generalized anxiety disorder 01/07/2012   Major depressive disorder, recurrent ([place]) 09/29/2009   Social History   Tobacco Use   Smoking status: Every Day    Packs/day: 1.00    Years: 35.00    Pack years: 35.00    Types: Cigarettes    Passive exposure: Past   Smokeless tobacco: Never   Tobacco comments:    1ppd - 03/01/2021  Substance Use Topics   Alcohol use: No   Allergies  Allergen Reactions   Pregabalin Other (See Comments)    unknown   Trazodone Other (See Comments)    Agitation   Bupropion Anxiety   Methocarbamol Other (See Comments)    Agitation and stiffness   Tramadol Other (See Comments)    Nausea And Vomiting Reports she feels "high"   Current Meds  Medication Sig   albuterol (VENTOLIN HFA) 108 (90 Base) MCG/ACT inhaler Inhale 2 puffs into the lungs every 6 (six) hours as needed for wheezing or shortness of breath.   atorvastatin (LIPITOR) 80 MG tablet Take 80 mg by mouth every evening.   busPIRone (BUSPAR) 10 MG tablet Take 1 tablet (10 mg total) by mouth at bedtime.   busPIRone (BUSPAR) 15 MG tablet Take 1 tablet (15 mg total) by mouth daily with breakfast. Dose reduction   DULoxetine (CYMBALTA) 30 MG capsule Take 1 capsule (30 mg total) by mouth daily.   fluticasone furoate-vilanterol (BREO ELLIPTA) 100-25 MCG/ACT AEPB Inhale 1 puff into the lungs daily.   gabapentin (NEURONTIN) 300 MG capsule Take 1 capsule (300 mg total) by mouth 3 (three) times daily.   hydroxychloroquine (PLAQUENIL) 200 MG tablet Take 200 mg by mouth in the morning.   oxyCODONE (OXY IR/ROXICODONE) 5 MG immediate release tablet Take 1 tablet (5 mg total) by mouth 2 (two) times daily. Must last 30 days   [DISCONTINUED] doxepin (SINEQUAN) 10 MG capsule Take 1 capsule (10 mg total) by mouth at  bedtime as needed. For sleep   [DISCONTINUED] pantoprazole (PROTONIX) 40 MG tablet Take 40 mg by mouth in the morning.   [DISCONTINUED] predniSONE (DELTASONE) 10 MG tablet 10mg  daily until f/u after taper   [DISCONTINUED] predniSONE (STERAPRED UNI-PAK 21 TAB) 10 MG (21) TBPK tablet Use as directed   Immunization History  Administered Date(s) Administered   Hep A / Hep B 04/19/2004, 05/21/2004, 11/06/2004   Influenza,inj,Quad PF,6+ Mos 10/28/2014   Influenza-Unspecified 10/28/2014   PFIZER Comirnaty(Gray Top)Covid-19 Tri-Sucrose Vaccine 07/04/2019, 08/03/2019, 08/24/2019   Pneumococcal Polysaccharide-23 06/23/2014, 06/05/2015   Tdap 11/06/2004, 04/13/2011, 06/05/2015   Zoster Recombinat (Shingrix) 11/11/2018, 05/10/2019       Objective:   Physical Exam BP 120/80 (BP Location: Left Arm, Patient Position: Sitting, Cuff Size: Normal)   Pulse 73   Temp 98 F (36.7 C) (Oral)   Ht 5\' 3"  (1.6 m)   Wt 154 lb 6.4 oz (70 kg)   SpO2 96%   BMI 27.35 kg/m  GENERAL: Well-developed, well-nourished woman, no acute distress. HEAD: Normocephalic, atraumatic.  EYES: Pupils equal, round, reactive to light.  No scleral icterus.  MOUTH: Nose/mouth/throat not examined due to masking requirements for COVID 19. NECK: Supple. No thyromegaly. Trachea midline. No JVD.  No adenopathy. PULMONARY: Good air entry bilaterally.  No adventitious sounds. CARDIOVASCULAR: S1  and S2. Regular rate and rhythm.  No rubs, murmurs or gallops heard. ABDOMEN: Benign. MUSCULOSKELETAL: No joint deformity, no clubbing, no edema.  NEUROLOGIC: No focal deficit, no gait disturbance, speech is fluent. SKIN: Intact,warm,dry. PSYCH: Mood and behavior normal.     Assessment & Plan:     ICD-10-CM   1. Nodule of upper lobe of left lung  R91.1 CT CHEST WO CONTRAST   Consistent with inflammatory change We will need to continue to follow expectantly May need rebiopsy Status post prednisone therapy Repeat CT chest mid May     2. Asthma with COPD ([place])  J44.9    Bilateral Breo Ellipta 100, 1 puff daily Continue as needed albuterol    3. Lupus ([place])  M32.9    This issue adds complexity to her management On Plaquenil    4. Tobacco dependence due to cigarettes  F17.210    Has cut down to 5 cigarettes/day Continue to engage in smoking cessation efforts     Orders Placed This Encounter  Procedures   CT CHEST WO CONTRAST    Standing Status:   Future    Standing Expiration Date:   04/20/2022    Scheduling Instructions:     Mid may    Order Specific Question:   Is patient pregnant?    Answer:   No    Order Specific Question:   Preferred imaging location?    Answer:   [place] ordered this encounter  Medications   fluticasone furoate-vilanterol (BREO ELLIPTA) 100-25 MCG/ACT AEPB    Sig: Inhale 1 puff into the lungs daily.    Dispense:  28 each    Refill:  0    Order Specific Question:   Lot Number?    Answer:   [id number]    Order Specific Question:   Expiration Date?    Answer:   07/21/2022    Order Specific Question:   Quantity    Answer:   2   Will see the patient in follow-up after her CT in mid May.  She is to contact us prior to that time should any new difficulties arise.  She has been counseled that she may need repeat bronchoscopy.  Renold Don, MD Advanced Bronchoscopy PCC[facility]-    *This note was dictated using voice recognition software/Dragon.  Despite best efforts to proofread, errors can occur which can change the meaning. Any transcriptional errors that result from this process are unintentional and may not be fully corrected at the time of dictation.

## 2021-04-19 NOTE — Patient Instructions (Signed)
We are going to give you a trial of Breo Ellipta 101 puff daily.  Make sure you rinse your mouth well after you use it. ? ?Can still use your albuterol as needed. ? ?We are going to get a repeat CT scan in the midportion of May.  We will see him in follow-up after that.  Call sooner should any new problems arise. ? ?Continue your efforts to quit smoking.  You are doing a good job with reducing the amount of cigarettes. ?

## 2021-05-01 ENCOUNTER — Encounter: Payer: Self-pay | Admitting: Pulmonary Disease

## 2021-05-01 NOTE — Progress Notes (Signed)
PROVIDER NOTE: Information contained herein reflects review and annotations entered in association with encounter. Interpretation of such information and data should be left to medically-trained personnel. Information provided to patient can be located elsewhere in the medical record under "Patient Instructions". Document created using STT-dictation technology, any transcriptional errors that may result from process are unintentional.  ?  ?Patient: Tanya [name]  Service Category: E/M  Provider: Gaspar Cola, MD  ?DOB: Feb 19, 1960  DOS: 05/02/2021  Specialty: Interventional Pain Management  ?MRN: [id number]  Setting: Ambulatory outpatient  PCP: Leonel Ramsay, MD  ?Type: Established Patient    Referring Provider: [place]  ?Location: Office  Delivery: Face-to-face    ? ?HPI  ?[name], a 61 y.o. year old female, is here today because of her Chronic pain syndrome [G89.4]. [name] primary complain today is Back Pain (Low ) ?Last encounter: My last encounter with her was on 04/06/2021. ?Pertinent problems: [name] has Chronic upper back pain (2ry area of Pain) (Bilateral) (R>L); Chronic low back pain (3ry area of Pain) (Bilateral) (R>L) w/o sciatica; Chronic neck pain (1ry area of Pain) (Bilateral) (R>L); Fibromyalgia; Osteoarthritis; Chronic pain syndrome; Chronic upper extremity pain (Right); Chronic cervical radicular pain (Right); Chronic shoulder pain (Bilateral) (R>L); Chronic musculoskeletal pain; Cervical spondylosis; Cervical facet syndrome (Bilateral) (R>L); Shoulder radicular pain (Bilateral) (R>L); Acute postoperative pain; Spondylosis without myelopathy or radiculopathy, cervical region; Cervicalgia; Occipital headache (Left); Neurogenic pain; Cervico-occipital neuralgia (Left); Chronic elbow pain (Left); Osteoarthritis of AC (acromioclavicular) joint (Right); Chronic acromioclavicular joint pain (Right); Chronic shoulder pain (Right); Epicondylitis  elbow, medial (Left); 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 ?Pain Assessment: Severity of Chronic pain is reported as a 6 /10. Location: Back Lower/sometimes goes down into left buttock. Onset: More than a month ago. Quality:  . Timing: Constant. Modifying factor(s): meds. ?Vitals:  height is '5\' 3"'$  (1.6 m) and weight is 154 lb (69.9 kg). Her temperature is 97 ?F (36.1 ?C) (abnormal). Her blood pressure is 132/83 and her pulse is 71. Her respiration is 18 and oxygen saturation is 97%.  ? ?Reason for encounter: medication management.   The patient indicates doing well with the current medication regimen. No adverse reactions or side effects reported to the medications.  ? ?RTCB: 08/05/2021 ?Nonopioids transfer 12/22/2019: Baclofen and Neurontin ? ?Pharmacotherapy Assessment  ?Analgesic: Oxycodone IR 5 mg, 1 tab PO BID (10 mg/day of oxycodone) ?MME/day: 15 mg/day.  ? ?Monitoring: ?Tanya [name] PMP: PDMP reviewed during this encounter.       ?Pharmacotherapy: No side-effects or adverse reactions reported. ?Compliance: No problems identified. ?Effectiveness: Clinically acceptable. ? ?Dewayne Shorter, RN  05/02/2021 11:53 AM  Sign when Signing Visit ?Nursing Pain Medication Assessment:  ?Safety precautions to be maintained throughout the outpatient stay will include: orient to surroundings, keep bed in low position, maintain call bell within reach at all times, provide assistance with transfer out of bed and ambulation.  ?Medication Inspection Compliance: Pill count conducted under aseptic conditions, in front of the patient. Neither the pills nor the bottle was  removed from the patient's sight at any time. Once count was completed pills were immediately returned to the patient in their original bottle. ? ?Medication: Oxycodone IR ?Pill/Patch Count:  0  of 60 pills remain ?Pill/Patch Appearance: Markings consistent with prescribed medication ?Bottle Appearance: Standard pharmacy container. Clearly labeled. ?Filled Date: 03 / 18 / 2023 ?Last Medication intake:  Ran out of medicine more than 48 hours ago ?   UDS:  ?Summary  ?Date Value Ref Range Status  ?01/31/2021 Note  Final  ?  Comment:  ?  ==================================================================== ?[employer] Select 13 (MW) ?==================================================================== ?Test                             Result       Flag       Units ? ?Drug Present and Declared for Prescription Verification ?  Oxazepam                       187          EXPECTED   ng/mg creat ?   Oxazepam may be administered as a scheduled prescription medication; ?   it is also an expected metabolite of other benzodiazepine drugs, ?   including diazepam, chlordiazepoxide, prazepam, clorazepate, ?   halazepam, and temazepam. ? ?  Oxycodone                      972          EXPECTED   ng/mg creat ?  Oxymorphone                    197          EXPECTED   ng/mg creat ?  Noroxycodone                   500          EXPECTED   ng/mg creat ?   Sources of oxycodone include scheduled prescription medications. ?   Oxymorphone and noroxycodone are expected metabolites of oxycodone. ?   Oxymorphone is also available as a scheduled prescription medication. ? ?==================================================================== ?Test                      Result    Flag   Units      Ref Range ?  Creatinine              39               mg/dL      >=20 ?==================================================================== ?Declared Medications: ? The flagging and interpretation on this report are based on the ? following declared  medications.  Unexpected results may arise from ? inaccuracies in the declared medications. ? ? **Note: The testing scope of this panel includes these medications: ? ? Diazepam (Valium) ? Oxycodone (Roxicodone) ? ? **Note: The testing scope of this panel does not include the ? following reported medications: ? ? Albuterol (Ventolin HFA) ? Atorvastatin (Lipitor) ? Baclofen (Lioresal) ? Buspirone (Buspar) ? Dextromethorphan ? Duloxetine (Cymbalta) ? Gabapentin (Neurontin) ? Promethazine ?==================================================================== ?For clinical consultation, please call [phone]. ?==================================================================== ?  ?  ? ?ROS  ?Constitutional: Denies any fever or chills ?Gastrointestinal: No reported hemesis, hematochezia, vomiting, or acute GI distress ?Musculoskeletal: Denies any acute onset joint swelling, redness, loss of ROM, or weakness ?Neurological: No reported episodes of acute onset apraxia, aphasia, dysarthria, agnosia, amnesia, paralysis, loss of coordination, or loss of consciousness ? ?Medication Review  ?DULoxetine, albuterol, atorvastatin, busPIRone, gabapentin, hydroxychloroquine, and oxyCODONE ? ?History Review  ?Allergy: Ms. Salberg is allergic  to pregabalin, trazodone, bupropion, methocarbamol, and tramadol. ?Drug: Ms. Ayllon  reports no history of drug use. ?Alcohol:  reports no history of alcohol use. ?Tobacco:  reports that she has been smoking cigarettes. She has a 35.00 pack-year smoking history. She has been exposed to tobacco smoke. She has never used smokeless tobacco. ?Social: Ms. Dampier  reports that she has been smoking cigarettes. She has a 35.00 pack-year smoking history. She has been exposed to tobacco smoke. She has never used smokeless tobacco. She reports that she does not drink alcohol and does not use drugs. ?Medical:  has a past medical history of Anxiety, Bladder infection, Chronic lower back pain,  Chronic neck pain, Collagen vascular disease ([place]), COVID-19, Depression, Diverticulitis, GERD (gastroesophageal reflux disease), Hyperlipidemia, Lupus ([place]), and Overactive bladder. ?Surgical: Ms. Dickison  has a pas

## 2021-05-02 ENCOUNTER — Ambulatory Visit: Payer: Medicaid Other | Attending: Pain Medicine | Admitting: Pain Medicine

## 2021-05-02 ENCOUNTER — Encounter: Payer: Self-pay | Admitting: Pain Medicine

## 2021-05-02 VITALS — BP 132/83 | HR 71 | Temp 97.0°F | Resp 18 | Ht 63.0 in | Wt 154.0 lb

## 2021-05-02 DIAGNOSIS — M549 Dorsalgia, unspecified: Secondary | ICD-10-CM | POA: Diagnosis present

## 2021-05-02 DIAGNOSIS — M25512 Pain in left shoulder: Secondary | ICD-10-CM | POA: Insufficient documentation

## 2021-05-02 DIAGNOSIS — G894 Chronic pain syndrome: Secondary | ICD-10-CM | POA: Diagnosis present

## 2021-05-02 DIAGNOSIS — M542 Cervicalgia: Secondary | ICD-10-CM | POA: Insufficient documentation

## 2021-05-02 DIAGNOSIS — M545 Low back pain, unspecified: Secondary | ICD-10-CM | POA: Insufficient documentation

## 2021-05-02 DIAGNOSIS — M25511 Pain in right shoulder: Secondary | ICD-10-CM | POA: Insufficient documentation

## 2021-05-02 DIAGNOSIS — Z79891 Long term (current) use of opiate analgesic: Secondary | ICD-10-CM | POA: Diagnosis present

## 2021-05-02 DIAGNOSIS — G8929 Other chronic pain: Secondary | ICD-10-CM | POA: Insufficient documentation

## 2021-05-02 DIAGNOSIS — M47812 Spondylosis without myelopathy or radiculopathy, cervical region: Secondary | ICD-10-CM | POA: Diagnosis present

## 2021-05-02 DIAGNOSIS — M7918 Myalgia, other site: Secondary | ICD-10-CM | POA: Diagnosis present

## 2021-05-02 DIAGNOSIS — Z79899 Other long term (current) drug therapy: Secondary | ICD-10-CM | POA: Diagnosis present

## 2021-05-02 MED ORDER — OXYCODONE HCL 5 MG PO TABS: 5.0000 mg | ORAL_TABLET | Freq: Two times a day (BID) | ORAL | 0 refills | Status: DC

## 2021-05-02 NOTE — Progress Notes (Signed)
Nursing Pain Medication Assessment:  ?Safety precautions to be maintained throughout the outpatient stay will include: orient to surroundings, keep bed in low position, maintain call bell within reach at all times, provide assistance with transfer out of bed and ambulation.  ?Medication Inspection Compliance: Pill count conducted under aseptic conditions, in front of the patient. Neither the pills nor the bottle was removed from the patient's sight at any time. Once count was completed pills were immediately returned to the patient in their original bottle. ? ?Medication: Oxycodone IR ?Pill/Patch Count:  0 of 60 pills remain ?Pill/Patch Appearance: Markings consistent with prescribed medication ?Bottle Appearance: Standard pharmacy container. Clearly labeled. ?Filled Date: 03 / 18 / 2023 ?Last Medication intake:  Ran out of medicine more than 48 hours ago ?

## 2021-05-07 ENCOUNTER — Other Ambulatory Visit: Payer: Self-pay | Admitting: Psychiatry

## 2021-05-07 DIAGNOSIS — F5101 Primary insomnia: Secondary | ICD-10-CM

## 2021-05-07 DIAGNOSIS — G4700 Insomnia, unspecified: Secondary | ICD-10-CM

## 2021-05-07 NOTE — Telephone Encounter (Signed)
Where the rash was located I am considering that he may have been related to yeast.  However, the inhaler was given to her just as a trial.  If she does not think she is getting benefit and she thinks the rash was related we can stop it. ?

## 2021-05-07 NOTE — Telephone Encounter (Signed)
Dr. Gonzalez, please advise. thanks 

## 2021-05-11 ENCOUNTER — Encounter: Payer: Self-pay | Admitting: Psychiatry

## 2021-05-11 ENCOUNTER — Ambulatory Visit (INDEPENDENT_AMBULATORY_CARE_PROVIDER_SITE_OTHER): Payer: Medicaid Other | Admitting: Psychiatry

## 2021-05-11 VITALS — BP 155/95 | HR 102 | Temp 98.1°F | Wt 152.4 lb

## 2021-05-11 DIAGNOSIS — F411 Generalized anxiety disorder: Secondary | ICD-10-CM

## 2021-05-11 DIAGNOSIS — F3341 Major depressive disorder, recurrent, in partial remission: Secondary | ICD-10-CM

## 2021-05-11 DIAGNOSIS — F5101 Primary insomnia: Secondary | ICD-10-CM | POA: Diagnosis not present

## 2021-05-11 DIAGNOSIS — F17200 Nicotine dependence, unspecified, uncomplicated: Secondary | ICD-10-CM

## 2021-05-11 MED ORDER — BUSPIRONE HCL 15 MG PO TABS: 15.0000 mg | ORAL_TABLET | Freq: Three times a day (TID) | ORAL | 0 refills | Status: DC

## 2021-05-11 MED ORDER — DULOXETINE HCL 30 MG PO CPEP: 30.0000 mg | ORAL_CAPSULE | Freq: Every day | ORAL | 0 refills | Status: DC

## 2021-05-11 NOTE — Progress Notes (Signed)
[place] [facility] MD OP Progress Note ? ?05/11/2021 12:30 PM ?Tanya [name]  ?MRN:  [id number] ? ?Chief Complaint:  ?Chief Complaint  ?Patient presents with  ? Follow-up: 61 year old Caucasian female with history of GAD, MDD, insomnia, presented for medication management.  ? ?HPI: Tanya [name] is a 61 year old Caucasian female on SSD, separated from husband, lives in [place] with her daughter, has a history of GAD, MDD, insomnia, chronic pain, fibromyalgia, osteoarthritis, SLE was evaluated in office today. ? ?Patient today reports she did not tolerate the doxepin well.  The doxepin helped with sleep and anxiety however it made her irritable and mean.  Patient hence stopped taking it. ? ?Patient reports she has been taking BuSpar 15 mg twice a day instead of once a day since the past several weeks.  She reports that has definitely helped with her anxiety.  She however continues to have a lot of psychosocial stresses and that keeps her anxious and overwhelmed a lot.  Interested in dosage increase of the BuSpar further.  Tolerating it well so far. ? ?Patient reports currently compliant with psychotherapy sessions.  Motivated to stay in therapy. ? ?Does have sleep problems however is interested in working on sleep hygiene.  Does use a lot of caffeine and is interested in cutting back. ? ?Patient denies any suicidality, homicidality or perceptual disturbances. ? ?Does have a history of SLE, has upcoming imaging coming up of her chest and may need possible biopsy.  Currently working closely with her providers on the same. ? ?Patient denies any other concerns today. ? ?Visit Diagnosis:  ?  ICD-10-CM   ?1. Generalized anxiety disorder  F41.1 busPIRone (BUSPAR) 15 MG tablet  ?  DULoxetine (CYMBALTA) 30 MG capsule  ?  ?2. MDD (major depressive disorder), recurrent, in partial remission (HCC)  F33.41 busPIRone (BUSPAR) 15 MG tablet  ?  DULoxetine (CYMBALTA) 30 MG capsule  ?  ?3. Primary insomnia  F51.01   ?  ?4. Tobacco use disorder   F17.200   ?  ? ? ?Past Psychiatric History: Reviewed past psychiatric history from progress note on 02/06/2021.  Past trials of Cymbalta-higher dosage could not tolerate, trazodone-had side effects, Wellbutrin-side effects, Valium, doxepin, Trintellix, mirtazapine. ? ?Past Medical History:  ?Past Medical History:  ?Diagnosis Date  ? Anxiety   ? Bladder infection   ? 8/18  ? Chronic lower back pain   ? Chronic neck pain   ? Collagen vascular disease ([place])   ? COVID-19   ? Depression   ? Diverticulitis   ? GERD (gastroesophageal reflux disease)   ? Hyperlipidemia   ? Lupus ([place])   ? Overactive bladder   ?  ?Past Surgical History:  ?Procedure Laterality Date  ? ABDOMINAL HYSTERECTOMY    ? COLONOSCOPY    ? COLONOSCOPY WITH PROPOFOL N/A 07/03/2017  ? Procedure: COLONOSCOPY WITH PROPOFOL;  Surgeon: Tanya Sails, MD;  Location: [facility] ENDOSCOPY;  Service: Endoscopy;  Laterality: N/A;  ? COLONOSCOPY WITH PROPOFOL N/A 10/21/2017  ? Procedure: COLONOSCOPY WITH PROPOFOL;  Surgeon: Tanya Sails, MD;  Location[facility] ENDOSCOPY;  Service: Endoscopy;  Laterality: N/A;  ? ESOPHAGOGASTRODUODENOSCOPY N/A 10/21/2017  ? Procedure: ESOPHAGOGASTRODUODENOSCOPY (EGD);  Surgeon: Tanya Sails, MD;  Location: [facility] ENDOSCOPY;  Service: Endoscopy;  Laterality: N/A;  ? TONSILLECTOMY    ? ? ?Family Psychiatric History: Reviewed family psychiatric history from progress note on 02/06/2021. ? ?Family History:  ?Family History  ?Problem Relation Age of Onset  ? Depression Mother   ? Alcohol abuse  Mother   ? Cancer Mother   ? Hypertension Mother   ? Emphysema Mother   ? Stroke Mother   ? Glaucoma Father   ? Heart disease Father   ? Diabetes Brother   ? Drug abuse Son   ? ? ?Social History: Reviewed social history from progress note on 02/06/2021. ?Social History  ? ?Socioeconomic History  ? Marital status: Married  ?  Spouse name: Not on file  ? Number of children: Not on file  ? Years of education: Not on file  ? Highest education level:  Not on file  ?Occupational History  ? Not on file  ?Tobacco Use  ? Smoking status: Every Day  ?  Packs/day: 1.00  ?  Years: 35.00  ?  Pack years: 35.00  ?  Types: Cigarettes  ?  Passive exposure: Past  ? Smokeless tobacco: Never  ? Tobacco comments:  ?  1ppd - 03/01/2021  ?Vaping Use  ? Vaping Use: Never used  ?Substance and Sexual Activity  ? Alcohol use: No  ? Drug use: No  ? Sexual activity: Not Currently  ?Other Topics Concern  ? Not on file  ?Social History Narrative  ? Not on file  ? ?Social Determinants of Health  ? ?Financial Resource Strain: Not on file  ?Food Insecurity: Not on file  ?Transportation Needs: Not on file  ?Physical Activity: Not on file  ?Stress: Not on file  ?Social Connections: Not on file  ? ? ?Allergies:  ?Allergies  ?Allergen Reactions  ? Pregabalin Other (See Comments)  ?  unknown  ? Trazodone Other (See Comments)  ?  Agitation  ? Bupropion Anxiety  ? Methocarbamol Other (See Comments)  ?  Agitation and stiffness  ? Tramadol Other (See Comments)  ?  Nausea And Vomiting ?Reports she feels "high"  ? ? ?Metabolic Disorder Labs: ?No results found for: HGBA1C, MPG ?No results found for: PROLACTIN ?No results found for: CHOL, TRIG, HDL, CHOLHDL, VLDL, LDLCALC ?Lab Results  ?Component Value Date  ? TSH 3.347 02/07/2021  ? ? ?Therapeutic Level Labs: ?No results found for: LITHIUM ?No results found for: VALPROATE ?No components found for:  CBMZ ? ?Current Medications: ?Current Outpatient Medications  ?Medication Sig Dispense Refill  ? albuterol (VENTOLIN HFA) 108 (90 Base) MCG/ACT inhaler Inhale 2 puffs into the lungs every 6 (six) hours as needed for wheezing or shortness of breath. 8 g 2  ? atorvastatin (LIPITOR) 80 MG tablet Take 80 mg by mouth every evening.    ? busPIRone (BUSPAR) 15 MG tablet Take 1 tablet (15 mg total) by mouth 3 (three) times daily. Dose increase 270 tablet 0  ? DULoxetine (CYMBALTA) 30 MG capsule Take 1 capsule (30 mg total) by mouth daily. 90 capsule 0  ? gabapentin  (NEURONTIN) 300 MG capsule Take 1 capsule (300 mg total) by mouth 3 (three) times daily. 90 capsule 2  ? hydroxychloroquine (PLAQUENIL) 200 MG tablet Take 200 mg by mouth in the morning.    ? oxyCODONE (OXY IR/ROXICODONE) 5 MG immediate release tablet Take 1 tablet (5 mg total) by mouth 2 (two) times daily. Must last 30 days 60 tablet 0  ? [START ON 06/06/2021] oxyCODONE (OXY IR/ROXICODONE) 5 MG immediate release tablet Take 1 tablet (5 mg total) by mouth 2 (two) times daily. Must last 30 days 60 tablet 0  ? [START ON 07/06/2021] oxyCODONE (OXY IR/ROXICODONE) 5 MG immediate release tablet Take 1 tablet (5 mg total) by mouth 2 (two) times daily. Must  last 30 days 60 tablet 0  ? ?No current facility-administered medications for this visit.  ? ?Facility-Administered Medications Ordered in Other Visits  ?Medication Dose Route Frequency Provider Last Rate Last Admin  ? albuterol (PROVENTIL) (2.5 MG/3ML) 0.083% nebulizer solution 2.5 mg  2.5 mg Nebulization Once Tyler Pita, MD      ? ? ? ?Musculoskeletal: ?Strength & Muscle Tone: within normal limits ?Gait & Station: normal ?Patient leans: N/A ? ?Psychiatric Specialty Exam: ?Review of Systems  ?Skin:  Positive for rash (rash on BL side of face- hx of lupus).  ?Psychiatric/Behavioral:  Positive for dysphoric mood and sleep disturbance. The patient is nervous/anxious.   ?All other systems reviewed and are negative.  ?Blood pressure (!) 155/95, pulse (!) 102, temperature 98.1 ?F (36.7 ?C), temperature source Temporal, weight 152 lb 6.4 oz (69.1 kg).Body mass index is 27 kg/m?.  ?General Appearance: Casual  ?Eye Contact:  Fair  ?Speech:  Clear and Coherent  ?Volume:  Normal  ?Mood:  Anxious and Depressed  ?Affect:  Congruent  ?Thought Process:  Goal Directed and Descriptions of Associations: Intact  ?Orientation:  Full (Time, Place, and Person)  ?Thought Content: Logical   ?Suicidal Thoughts:  No  ?Homicidal Thoughts:  No  ?Memory:  Immediate;   Fair ?Recent;    Fair ?Remote;   Fair  ?Judgement:  Fair  ?Insight:  Fair  ?Psychomotor Activity:  Normal  ?Concentration:  Concentration: Fair and Attention Span: Fair  ?Recall:  Fair  ?Fund of Knowledge: Fair  ?Language: Fair  ?

## 2021-05-16 ENCOUNTER — Telehealth: Payer: Self-pay

## 2021-05-16 DIAGNOSIS — F5101 Primary insomnia: Secondary | ICD-10-CM

## 2021-05-16 MED ORDER — ESZOPICLONE 1 MG PO TABS: 1.0000 mg | ORAL_TABLET | Freq: Every evening | ORAL | 0 refills | Status: DC | PRN

## 2021-05-16 NOTE — Telephone Encounter (Signed)
received a fax from [email or web address] that a prior auth was needed for the eszopiclone '1mg'$   ?

## 2021-05-16 NOTE — Telephone Encounter (Signed)
went online to [email or web address] and submitted the prior auth for eszopiclone '1mg'$ . - prior Tanya [name] was approved PA Case # [id number]  approved from 05-16-21 to  11-12-21 ?

## 2021-05-16 NOTE — Telephone Encounter (Signed)
pt called states that she wanted to know if you would decrease her buspar to twice a day and then add klonopin '1mg'$  to take at night.  ?

## 2021-05-16 NOTE — Telephone Encounter (Signed)
Returned call to patient.  Discussed with patient Klonopin may not be the right option.  She is currently struggling with sleep. ? ?She has tried and failed medications like zolpidem, mirtazapine, doxepin, trazodone. ? ? ?Start Lunesta 1 mg p.o. nightly ? ?Provided medication education. ? ? ?

## 2021-05-17 ENCOUNTER — Telehealth: Payer: Self-pay | Admitting: *Deleted

## 2021-05-17 NOTE — Telephone Encounter (Signed)
Returned call to patient, discussed with patient that 30-day supply was sent to the pharmacy. ? ?Patient to contact pharmacy regarding the same.  She will reach out to [place] when she talks to the pharmacist. ?

## 2021-05-17 NOTE — Telephone Encounter (Signed)
Patient called just wanted to let provider know that "she  took  1 of  3 pills prescribed for sleep  & it worked so well" stated she has 2 left  & asked if she would be prescribed another refill but a 30 day supply? ?

## 2021-05-19 ENCOUNTER — Other Ambulatory Visit: Payer: Self-pay | Admitting: Psychiatry

## 2021-05-19 ENCOUNTER — Other Ambulatory Visit: Payer: Self-pay | Admitting: Pulmonary Disease

## 2021-05-19 DIAGNOSIS — F5101 Primary insomnia: Secondary | ICD-10-CM

## 2021-06-04 ENCOUNTER — Ambulatory Visit (INDEPENDENT_AMBULATORY_CARE_PROVIDER_SITE_OTHER): Payer: Medicaid Other | Admitting: Licensed Clinical Social Worker

## 2021-06-04 ENCOUNTER — Encounter: Payer: Self-pay | Admitting: Licensed Clinical Social Worker

## 2021-06-04 ENCOUNTER — Other Ambulatory Visit: Payer: Self-pay | Admitting: Psychiatry

## 2021-06-04 DIAGNOSIS — F331 Major depressive disorder, recurrent, moderate: Secondary | ICD-10-CM

## 2021-06-04 DIAGNOSIS — F411 Generalized anxiety disorder: Secondary | ICD-10-CM | POA: Diagnosis not present

## 2021-06-04 DIAGNOSIS — F5101 Primary insomnia: Secondary | ICD-10-CM

## 2021-06-04 NOTE — Progress Notes (Signed)
Virtual Visit via Video Note ? ?I connected with Tanya [name] on 06/04/21 at 11:00 AM EDT by a video enabled telemedicine application and verified that I am speaking with the correct person using two identifiers. ? ?Location: ?Patient: home ?Provider: remote office [place], [place]) ? ?  ?I discussed the limitations of evaluation and management by telemedicine and the availability of in person appointments. The patient expressed understanding and agreed to proceed. ? ?I discussed the assessment and treatment plan with the patient. The patient was provided an opportunity to ask questions and all were answered. The patient agreed with the plan and demonstrated an understanding of the instructions. ?  ?The patient was advised to call back or seek an in-person evaluation if the symptoms worsen or if the condition fails to improve as anticipated. ? ?I provided 30 minutes of non-face-to-face time during this encounter. ? ? ?Tanya Slager R Imaan Padgett, LCSW ? ? ?THERAPIST PROGRESS NOTE ? ?Session Time: [id number] ? ?Participation Level: Active ? ?Behavioral Response: NAAlertAnxious ? ?Type of Therapy: Individual Therapy ? ?Treatment Goals addressed: Problem: Decrease depressive symptoms and improve levels of effective functioning-pt reports a decrease in overall depression symptoms 3 out of 5 sessions documented.  ?Goal: LTG: Reduce frequency, intensity, and duration of depression symptoms as evidenced by: per pt self report ?Outcome: Progressing ?Goal: STG: Tanya [name] WILL PARTICIPATE IN AT LEAST 80% OF SCHEDULED INDIVIDUAL PSYCHOTHERAPY SESSIONS ?Outcome: Progressing ?Intervention: REVIEW PLEASE SKILLS (TREAT PHYSICAL ILLNESS, BALANCE EATING, AVOID MOOD-ALTERING SUBSTANCES, BALANCE SLEEP AND GET EXERCISE) WITH Tanya [name] ?Note: review ?  ?Problem: Anxiety Disorder  ?Goal:  Reduce overall frequency, intensity, and duration of the anxiety so that daily functioning is not impaired per pt self report 3 out of 5 sessions documented.    ?Outcome: Not Progressing ?Goal: Enhance ability to effectively cope with the full variety of life's worries and anxiety per self report 3 out of 5 sessions documented  ?Outcome: Not Progressing ?Intervention: Encourage verbalization of feelings/concerns/expectations ?Note: review ?Intervention: Assist with relaxation techniques, as appropriate (deep breathing exercises, meditation, guided imagery) ?Note: Review--sent psychoeducational resources via MyChart ?Intervention: Promote identification & development of sleep hygiene program ?Note: reviewed ?  ? ?ProgressTowards Goals: Not Progressing ? ?Interventions: Solution Focused and Supportive ? ?Summary: Tanya [name] is a 61 y.o. female who presents with continuing symptoms related to depression and anxiety. Pt reports that her anxiety and panic has not gotten any better since last session.  ? ?Pt requested to end session early because she is in the car with family members and they are currently inside a store--pt will have no confidentiality in the space once they return to the car.  ? ?Allowed pt to explore and express thoughts and feelings associated with recent life situations and external stressors.Patient reports that she is experiencing continuing stress and anxiety on a regular basis. Patient reports that she is having panic attacks on a regular basis. Explored overall management of panic attacks. Patient reports that she doesn't feel she has resources, even though she has been in counseling for an extended period of time in the past. Reviewed coping skills and panic management. ? ?Discussed importance of social engagement--patient reports that she has one friend, but prefers to be around her family members. Patient made the comment ?when my children feel good, I feel good. When my children feel bad, I feel bad?Marland Kitchen  Allowed pt to explore that statement and discussed importance of setting limits, boundaries with children and focusing on self-care and  overall wellness. Pt wants to  focus on wellness.  ? ?Discussed continuing insomnia--discussed realistic expectations re: medication management. Encouraged pt to discuss with psychiatrist at upcoming appointment.  ? ?Continued recommendations are as follows: self care behaviors, positive social engagements, focusing on overall work/home/life balance, and focusing on positive physical and emotional wellness.  ? ?Suicidal/Homicidal: No ? ?Therapist Response: Pt reports limits in resources and coping skills at time of session. Sent pt resources through MyChart portal. Treatment to continue as indicated.  ? ?Plan: Return again in 4 weeks. ? ?Diagnosis: Moderate episode of recurrent major depressive disorder ([place]) ? ?Generalized anxiety disorder ? ?Collaboration of Care: Other pt encouraged to continue care with psychiatrist of record, Dr. Ursula Alert ? ?Patient/Guardian was advised Release of Information must be obtained prior to any record release in order to collaborate their care with an outside provider. Patient/Guardian was advised if they have not already done so to contact the registration department to sign all necessary forms in order for [place] to release information regarding their care.  ? ?Consent: Patient/Guardian gives verbal consent for treatment and assignment of benefits for services provided during this visit. Patient/Guardian expressed understanding and agreed to proceed.  ? ?Tanya Antosh R Delon Revelo, LCSW ?06/04/2021 ? ?

## 2021-06-04 NOTE — Plan of Care (Signed)
?  Problem: Decrease depressive symptoms and improve levels of effective functioning-pt reports a decrease in overall depression symptoms 3 out of 5 sessions documented.  ?Goal: LTG: Reduce frequency, intensity, and duration of depression symptoms as evidenced by: per pt self report ?Outcome: Progressing ?Goal: STG: Butch Penny WILL PARTICIPATE IN AT LEAST 80% OF SCHEDULED INDIVIDUAL PSYCHOTHERAPY SESSIONS ?Outcome: Progressing ?Intervention: REVIEW PLEASE SKILLS (TREAT PHYSICAL ILLNESS, BALANCE EATING, AVOID MOOD-ALTERING SUBSTANCES, BALANCE SLEEP AND GET EXERCISE) WITH [place] ?Note: review ?  ?Problem: Anxiety Disorder  ?Goal:  Reduce overall frequency, intensity, and duration of the anxiety so that daily functioning is not impaired per pt self report 3 out of 5 sessions documented.   ?Outcome: Not Progressing ?Goal: Enhance ability to effectively cope with the full variety of life's worries and anxiety per self report 3 out of 5 sessions documented  ?Outcome: Not Progressing ?Intervention: Encourage verbalization of feelings/concerns/expectations ?Note: review ?Intervention: Assist with relaxation techniques, as appropriate (deep breathing exercises, meditation, guided imagery) ?Note: Review--sent psychoeducational resources via MyChart ?Intervention: Promote identification & development of sleep hygiene program ?Note: reviewed ?  ?

## 2021-06-07 ENCOUNTER — Ambulatory Visit
Admission: RE | Admit: 2021-06-07 | Discharge: 2021-06-07 | Disposition: A | Discharge status: Home / Self Care | Payer: Medicaid Other | Source: Ambulatory Visit | Attending: Pulmonary Disease | Admitting: Pulmonary Disease

## 2021-06-07 DIAGNOSIS — R911 Solitary pulmonary nodule: Secondary | ICD-10-CM | POA: Insufficient documentation

## 2021-06-19 ENCOUNTER — Telehealth (INDEPENDENT_AMBULATORY_CARE_PROVIDER_SITE_OTHER): Payer: Medicaid Other | Admitting: Pulmonary Disease

## 2021-06-19 ENCOUNTER — Telehealth: Payer: Self-pay | Admitting: Pulmonary Disease

## 2021-06-19 ENCOUNTER — Encounter: Payer: Self-pay | Admitting: Pulmonary Disease

## 2021-06-19 DIAGNOSIS — F17210 Nicotine dependence, cigarettes, uncomplicated: Secondary | ICD-10-CM | POA: Diagnosis not present

## 2021-06-19 DIAGNOSIS — M329 Systemic lupus erythematosus, unspecified: Secondary | ICD-10-CM | POA: Diagnosis not present

## 2021-06-19 DIAGNOSIS — R911 Solitary pulmonary nodule: Secondary | ICD-10-CM | POA: Diagnosis not present

## 2021-06-19 NOTE — Telephone Encounter (Signed)
Dr. Patsey Berthold, please advise if 1:30 appt can be phone or virtual. Patient is unable to make it to appt due to transportation issues.

## 2021-06-19 NOTE — Telephone Encounter (Signed)
Appt changed to virtual.  Patient is aware and voiced her understanding.  Nothing further needed.

## 2021-06-19 NOTE — Progress Notes (Signed)
Subjective:    Patient ID: Tanya [name], female    DOB: 05/21/1960, 61 y.o.   MRN: [id number] Patient Care Team: Leonel Ramsay, MD as PCP - General (Infectious Diseases)  Chief Complaint  Patient presents with   F/U CT chest    07 Jun 2021, nodule left upper lobe   ~~~~~~~~~~~~~~~~~~~~~~~~~~~~~~~~~~~~~~~~~~~~~~~~~~~~~~~~~ Virtual Visit by Video Note:   This visit type was conducted due to national recommendations for restrictions regarding the COVID-19 pandemic .  This format is felt to be most appropriate for this patient at this time.  All issues noted in this document were discussed and addressed.  No physical exam was performed (except for noted visual exam findings with Video Visits).    I connected with Tanya [name] via videoconferencing at 1:30 PM and verified that I was speaking with the correct person using two identifiers. Location patient: home Location provider: [facility] Persons participating in the virtual visit: patient, physician   I discussed the limitations, risks, security and privacy concerns of performing an evaluation and management service by telephone and the availability of in person appointments. The patient expressed understanding and agreed to proceed.  The visit was performed via video at the patient's request due to lack of transportation. ~~~~~~~~~~~~~~~~~~~~~~~~~~~~~~~~~~~~~~~~~~~~~~~~~~~~~~~~~ HPI Tanya [name] is a 61 year old current smoker (1 PPD) with a history as noted below, who is being seen today virtually for discussion of the most recent CT chest with a left upper lobe nodule.  Recall that the patient was initially seen on 01 March 2021 due to abnormal low-dose cancer screening CT.  She underwent bronchoscopy with robotic assistance on 14 March 2021 yielding only minimal inflammatory changes and for the most part was nondiagnostic.  She elected to proceed with observational protocol.  She had chest CT performed  on 18 May showing the parts solid irregular left upper lobe nodule with no growth.  Overall measurements of 1.7 x 2.4 cm and internal solid component of 1.2 x 1.4 cm.  This is unchanged from her lung cancer screening CT performed 27 February 2021.  I discussed the importance of gray bronchoscopy with the patient to try to obtain additional tissue however the patient at this point would want to wait.  Advised the patient that should not wait more than 3 months in follow-up.  We will obtain CT at that time and follow-up visit.  I do not advocate postponing rebiopsy.  Patient understands this but would want to wait at present.  She does not endorse any weight loss or anorexia, cough or sputum production.  No hemoptysis.  No chest pain.  No orthopnea or paroxysmal nocturnal dyspnea or lower extremity edema.  Overall she feels well.   Review of Systems A 10 point review of systems was performed and it is as noted above otherwise negative.  Patient Active Problem List   Diagnosis Date Noted   Moderate episode of recurrent major depressive disorder ([place]) 03/23/2021   Tobacco use disorder 03/23/2021   Insomnia 02/06/2021   At risk for prolonged QT interval syndrome 02/06/2021   Psychiatric illness 11/01/2020   Chronic use of opiate for therapeutic purpose 06/25/2020   Arthropathy of cervical facet joint (Bilateral) 04/13/2020   Cervical spine pain 04/13/2020   Abnormal MRI, cervical spine (09/20/2015) 04/13/2020   Cervical central spinal stenosis (Multilevel) 04/13/2020   Cervical foraminal stenosis (Right: C3-4) (Bilateral: C4-5, C5-6) 04/13/2020   DDD (degenerative disc disease), cervical 03/14/2020   Chronic shoulder pain (Left) 09/06/2019   Osteoarthritis  of shoulder (Left) 09/06/2019   Osteoarthritis of shoulders (Bilateral) (R>L) 10/25/2018   Osteoarthritis of AC (acromioclavicular) joint (Right) 04/02/2018   Chronic acromioclavicular joint pain (Right) 04/02/2018   Chronic shoulder pain  (Right) 04/02/2018   Epicondylitis elbow, medial (Left) 04/02/2018   Chronic elbow pain (Left) 03/25/2018   Occipital headache (Left) 12/10/2017   Neurogenic pain 12/10/2017   Cervico-occipital neuralgia (Left) 12/10/2017   Pharmacologic therapy 09/01/2017   Disorder of skeletal system 09/01/2017   Problems influencing health status 09/01/2017   Spondylosis without myelopathy or radiculopathy, cervical region 08/05/2017   Cervicalgia 08/05/2017   Rash 05/13/2017   Acute postoperative pain 06/26/2016   Cervical spondylosis 04/15/2016   Cervical facet syndrome (Bilateral) (R>L) 04/15/2016   Shoulder radicular pain (Bilateral) (R>L) 04/15/2016   Chronic musculoskeletal pain 03/07/2016   Vitamin D insufficiency 12/19/2015   Anxiety 11/23/2015   Chronic low back pain (3ry area of Pain) (Bilateral) (R>L) w/o sciatica 11/23/2015   Chronic neck pain (1ry area of Pain) (Bilateral) (R>L) 11/23/2015   Long term prescription benzodiazepine use 11/23/2015   Long term current use of opiate analgesic 11/23/2015   Long term prescription opiate use 11/23/2015   Opiate use (10 MME/Day) 11/23/2015   Chronic pain syndrome 11/23/2015   Chronic upper extremity pain (Right) 11/23/2015   Chronic cervical radicular pain (Right) 11/23/2015   Chronic shoulder pain (Bilateral) (R>L) 11/23/2015   Hypercholesterolemia 06/20/2015   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   Encounter for long-term (current) use of high-risk medication 06/06/2015   Pain medication agreement signed 11/24/2014   Hyperlipidemia 01/25/2014   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   Diverticulitis 10/04/2012   Osteoarthritis 10/04/2012   Chronic upper back pain (2ry area of Pain) (Bilateral) (R>L) 08/17/2012   Urinary incontinence 08/17/2012   Fibromyalgia 06/04/2012   SLE (systemic lupus erythematosus) ([place]) 02/18/2012   Generalized anxiety disorder 01/07/2012   Major depressive disorder, recurrent ([place]) 09/29/2009   Social History    Tobacco Use   Smoking status: Every Day    Packs/day: 1.00    Years: 35.00    Pack years: 35.00    Types: Cigarettes    Passive exposure: Past   Smokeless tobacco: Never   Tobacco comments:    1ppd - 03/01/2021  Substance Use Topics   Alcohol use: No   Allergies  Allergen Reactions   Pregabalin Other (See Comments)    unknown   Trazodone Other (See Comments)    Agitation   Bupropion Anxiety   Methocarbamol Other (See Comments)    Agitation and stiffness   Tramadol Other (See Comments)    Nausea And Vomiting Reports she feels "high"   Current Meds  Medication Sig   atorvastatin (LIPITOR) 80 MG tablet Take 80 mg by mouth every evening.   busPIRone (BUSPAR) 15 MG tablet Take 1 tablet (15 mg total) by mouth 3 (three) times daily. Dose increase   DULoxetine (CYMBALTA) 30 MG capsule Take 1 capsule (30 mg total) by mouth daily.   eszopiclone (LUNESTA) 1 MG TABS tablet TAKE 1 TABLET BY MOUTH AT BEDTIME AS NEEDED FOR SLEEP. TAKE IMMEDIATELY BEFORE BEDTIME   gabapentin (NEURONTIN) 300 MG capsule Take 1 capsule (300 mg total) by mouth 3 (three) times daily.   hydroxychloroquine (PLAQUENIL) 200 MG tablet Take 200 mg by mouth in the morning.   oxyCODONE (OXY IR/ROXICODONE) 5 MG immediate release tablet Take 1 tablet (5 mg total) by mouth 2 (two) times daily. Must last 30 days   [START ON 07/06/2021] oxyCODONE (OXY  IR/ROXICODONE) 5 MG immediate release tablet Take 1 tablet (5 mg total) by mouth 2 (two) times daily. Must last 30 days   VENTOLIN HFA 108 (90 Base) MCG/ACT inhaler INHALE 2 PUFFS INTO THE LUNGS EVERY 6 HOURS AS NEEDED FOR WHEEZING OR SHORTNESS OF BREATH   Immunization History  Administered Date(s) Administered   Hep A / Hep B 04/19/2004, 05/21/2004, 11/06/2004   Influenza,inj,Quad PF,6+ Mos 10/28/2014   Influenza-Unspecified 10/28/2014   PFIZER Comirnaty(Gray Top)Covid-19 Tri-Sucrose Vaccine 07/04/2019, 08/03/2019, 08/24/2019   Pneumococcal Polysaccharide-23 06/23/2014,  06/05/2015   Tdap 11/06/2004, 04/13/2011, 06/05/2015   Zoster Recombinat (Shingrix) 11/11/2018, 05/10/2019      Objective:   Physical Exam  Vitals not obtained due to the nature of the visit.  Due to the nature of the visit, no physical exam was performed.  Patient did not exhibit conversational dyspnea during the visit.  On video patient appeared to be in no distress, no conversational dyspnea.  Representative image from the CT performed 07 Jun 2021 showing persistence of groundglass/subsolid nodule left upper lobe:       Assessment & Plan:     ICD-10-CM   1. Nodule of upper lobe of left lung  R91.1 CT CHEST WO CONTRAST   Short-term follow-up with CT in 3 months Follow-up visit after CT    2. Lupus ([place])  M32.9    This issue adds complexity to her management Follows with [facility] On Plaquenil    3. Tobacco dependence due to cigarettes  F17.210    Counseled regards to discontinuation of smoking     Orders Placed This Encounter  Procedures   CT CHEST WO CONTRAST    80mo   Standing Status:   Future    Standing Expiration Date:   06/20/2022    Order Specific Question:   Is patient pregnant?    Answer:   No    Order Specific Question:   Preferred imaging location?    Answer:   [facility]   We will see the patient in follow-up in 3 months time she is to contact [facility] to that time should any new difficulties arise   Total visit time 18 minutes.  CRenold Don MD Advanced Bronchoscopy PCCM [facility]    *This note was dictated using voice recognition software/Dragon.  Despite best efforts to proofread, errors can occur which can change the meaning. Any transcriptional errors that result from this process are unintentional and may not be fully corrected at the time of dictation.

## 2021-06-19 NOTE — Telephone Encounter (Signed)
Lm x1 for patient.  

## 2021-06-22 ENCOUNTER — Ambulatory Visit (INDEPENDENT_AMBULATORY_CARE_PROVIDER_SITE_OTHER): Payer: Medicaid Other | Admitting: Psychiatry

## 2021-06-22 ENCOUNTER — Encounter: Payer: Self-pay | Admitting: Psychiatry

## 2021-06-22 VITALS — BP 132/85 | HR 76 | Temp 98.3°F | Wt 150.8 lb

## 2021-06-22 DIAGNOSIS — F17200 Nicotine dependence, unspecified, uncomplicated: Secondary | ICD-10-CM

## 2021-06-22 DIAGNOSIS — F5101 Primary insomnia: Secondary | ICD-10-CM

## 2021-06-22 DIAGNOSIS — F411 Generalized anxiety disorder: Secondary | ICD-10-CM

## 2021-06-22 DIAGNOSIS — F3341 Major depressive disorder, recurrent, in partial remission: Secondary | ICD-10-CM | POA: Diagnosis not present

## 2021-06-22 MED ORDER — ESZOPICLONE 2 MG PO TABS: 2.0000 mg | ORAL_TABLET | Freq: Every evening | ORAL | 0 refills | Status: DC | PRN

## 2021-06-22 MED ORDER — VENLAFAXINE HCL ER 37.5 MG PO CP24: 37.5000 mg | ORAL_CAPSULE | Freq: Every day | ORAL | 0 refills | Status: DC

## 2021-06-22 MED ORDER — BUSPIRONE HCL 15 MG PO TABS: 15.0000 mg | ORAL_TABLET | Freq: Two times a day (BID) | ORAL | 0 refills | Status: DC

## 2021-06-22 MED ORDER — DULOXETINE HCL 20 MG PO CPEP: 20.0000 mg | ORAL_CAPSULE | Freq: Every day | ORAL | 0 refills | Status: DC

## 2021-06-22 NOTE — Patient Instructions (Signed)
Venlafaxine Extended-Release Capsules What is this medication? VENLAFAXINE (VEN la fax een) treats depression and anxiety. It increases the amount of serotonin and norepinephrine in the brain, hormones that help regulate mood. It belongs to a group of medications called SNRIs. This medicine may be used for other purposes; ask your health care provider or pharmacist if you have questions. COMMON BRAND NAME(S): Effexor XR What should I tell my care team before I take this medication? They need to know if you have any of these conditions: Bleeding disorders Glaucoma Heart disease High blood pressure High cholesterol Kidney disease Liver disease Low levels of sodium in the blood Mania or bipolar disorder Seizures Suicidal thoughts, plans, or attempt; a previous suicide attempt by you or a family Take medications that treat or prevent blood clots Thyroid disease An unusual or allergic reaction to venlafaxine, desvenlafaxine, other medications, foods, dyes, or preservatives Pregnant or trying to get pregnant Breast-feeding How should I use this medication? Take this medication by mouth with a full glass of water. Follow the directions on the prescription label. Do not cut, crush, or chew this medication. Take it with food. If needed, the capsule may be carefully opened and the entire contents sprinkled on a spoonful of cool applesauce. Swallow the applesauce/pellet mixture right away without chewing and follow with a glass of water to ensure complete swallowing of the pellets. Try to take your medication at about the same time each day. Do not take your medication more often than directed. Do not stop taking this medication suddenly except upon the advice of your care team. Stopping this medication too quickly may cause serious side effects or your condition may worsen. A special MedGuide will be given to you by the pharmacist with each prescription and refill. Be sure to read this information  carefully each time. Talk to your care team regarding the use of this medication in children. Special care may be needed. Overdosage: If you think you have taken too much of this medicine contact a poison control center or emergency room at once. NOTE: This medicine is only for you. Do not share this medicine with others. What if I miss a dose? If you miss a dose, take it as soon as you can. If it is almost time for your next dose, take only that dose. Do not take double or extra doses. What may interact with this medication? Do not take this medication with any of the following: Certain medications for fungal infections like fluconazole, itraconazole, ketoconazole, posaconazole, voriconazole Cisapride Desvenlafaxine Dronedarone Duloxetine Levomilnacipran Linezolid MAOIs like Carbex, Eldepryl, Marplan, Nardil, and Parnate Methylene blue (injected into a vein) Milnacipran Pimozide Thioridazine This medication may also interact with the following: Amphetamines Aspirin and aspirin-like medications Certain medications for depression, anxiety, or psychotic disturbances Certain medications for migraine headaches like almotriptan, eletriptan, frovatriptan, naratriptan, rizatriptan, sumatriptan, zolmitriptan Certain medications for sleep Certain medications that treat or prevent blood clots like dalteparin, enoxaparin, warfarin Cimetidine Clozapine Diuretics Fentanyl Furazolidone Indinavir Isoniazid Lithium Metoprolol NSAIDS, medications for pain and inflammation, like ibuprofen or naproxen Other medications that prolong the QT interval (cause an abnormal heart rhythm) like dofetilide, ziprasidone Procarbazine Rasagiline Supplements like St. John's wort, kava kava, valerian Tramadol Tryptophan This list may not describe all possible interactions. Give your health care provider a list of all the medicines, herbs, non-prescription drugs, or dietary supplements you use. Also tell them  if you smoke, drink alcohol, or use illegal drugs. Some items may interact with your medicine. What should   I watch for while using this medication? Tell your care team if your symptoms do not get better or if they get worse. Visit your care team for regular checks on your progress. Because it may take several weeks to see the full effects of this medication, it is important to continue your treatment as prescribed by your care team. Watch for new or worsening thoughts of suicide or depression. This includes sudden changes in mood, behaviors, or thoughts. These changes can happen at any time but are more common in the beginning of treatment or after a change in dose. Call your care team right away if you experience these thoughts or worsening depression. Manic episodes may happen in patients with bipolar disorder who take this medication. Watch for changes in feelings or behaviors such as feeling anxious, nervous, agitated, panicky, irritable, hostile, aggressive, impulsive, severely restless, overly excited and hyperactive, or trouble sleeping. These changes can happen at any time but are more common in the beginning of treatment or after a change in dose. Call your care team right away if you notice any of these symptoms. This medication can cause an increase in blood pressure. Check with your care team for instructions on monitoring your blood pressure while taking this medication. You may get drowsy or dizzy. Do not drive, use machinery, or do anything that needs mental alertness until you know how this medication affects you. Do not stand or sit up quickly, especially if you are an older patient. This reduces the risk of dizzy or fainting spells. Do not drink alcohol while taking this medication. Drinking alcohol may alter the effects of your medication. Serious side effects may occur. Your mouth may get dry. Chewing sugarless gum, sucking hard candy and drinking plenty of water will help. Contact your  care team if the problem does not go away or is severe. What side effects may I notice from receiving this medication? Side effects that you should report to your care team as soon as possible: Allergic reactions--skin rash, itching, hives, swelling of the face, lips, tongue, or throat Bleeding--bloody or black, tar-like stools, red or dark brown urine, vomiting blood or brown material that looks like coffee grounds, small, red or purple spots on skin, unusual bleeding or bruising Heart rhythm changes--fast or irregular heartbeat, dizziness, feeling faint or lightheaded, chest pain, trouble breathing Increase in blood pressure Loss of appetite with weight loss Low sodium level--muscle weakness, fatigue, dizziness, headache, confusion Serotonin syndrome--irritability, confusion, fast or irregular heartbeat, muscle stiffness, twitching muscles, sweating, high fever, seizures, chills, vomiting, diarrhea Sudden eye pain or change in vision such as blurry vision, seeing halos around lights, vision loss Thoughts of suicide or self-harm, worsening mood, feelings of depression Side effects that usually do not require medical attention (report to your care team if they continue or are bothersome): Anxiety, nervousness Change in sex drive or performance Dizziness Dry mouth Excessive sweating Nausea Tremors or shaking Trouble sleeping This list may not describe all possible side effects. Call your doctor for medical advice about side effects. You may report side effects to FDA at 1-800-FDA-1088. Where should I keep my medication? Keep out of the reach of children and pets. Store at a controlled temperature between 20 and 25 degrees C (68 degrees and 77 degrees F), in a dry place. Throw away any unused medication after the expiration date. NOTE: This sheet is a summary. It may not cover all possible information. If you have questions about this medicine, talk to your doctor,   pharmacist, or health care  provider.  2023 Elsevier/Gold Standard (2020-08-07 00:00:00)

## 2021-06-22 NOTE — Progress Notes (Signed)
[facility] MD OP Progress Note  06/22/2021 2:11 PM Tanya [name]  MRN:  [id number]  Chief Complaint:  Chief Complaint  Patient presents with   Follow-up: 61 year old Caucasian female with history of GAD, MDD, insomnia, presented for medication management.   HPI: Tanya [name] is a 61 year old Caucasian female on SSD, separated from husband, lives in [place], has a history of GAD, MDD, insomnia, chronic pain , fibromyalgia, osteoarthritis, SLE was evaluated in office today.  Patient today reports she continues to struggle with significant anxiety symptoms is often restless, anxious, has racing thoughts, worrying about different things.  She is currently on BuSpar 15 mg 3 times a day.  She believes the BuSpar could be causing some GI side effects.  She continues to be on low doses of Cymbalta since she did not tolerate the higher dosage.  Current medications are not helpful with anxiety.  Patient also has sleep problems.  She is in constant pain which could also be affecting her sleep.  She is on oxycodone and is currently under the care of a pain provider.  She does not believe the Lunesta helps her to fall asleep.  She currently gets around 4 hours of sleep.  Denies side effects.  Patient denies any sadness or low motivation or anhedonia and believes her depressive symptoms have improved.  Patient denies any suicidality, homicidality or perceptual disturbances.  Patient denies any other concerns today.  Visit Diagnosis:    ICD-10-CM   1. Generalized anxiety disorder  F41.1 busPIRone (BUSPAR) 15 MG tablet    DULoxetine (CYMBALTA) 20 MG capsule    venlafaxine XR (EFFEXOR-XR) 37.5 MG 24 hr capsule    2. MDD (major depressive disorder), recurrent, in partial remission (HCC)  F33.41 busPIRone (BUSPAR) 15 MG tablet    DULoxetine (CYMBALTA) 20 MG capsule    venlafaxine XR (EFFEXOR-XR) 37.5 MG 24 hr capsule    3. Primary insomnia  F51.01 eszopiclone (LUNESTA) 2 MG TABS tablet    4. Tobacco  use disorder  F17.200       Past Psychiatric History: Reviewed past psychiatric history from progress note on 02/06/2021.  Past trials of Cymbalta-higher dosage could not tolerate, trazodone-had side effects, Wellbutrin-side effects, Paxil-side effects, Prozac-side effects Valium, doxepin, Trintellix, mirtazapine  Past Medical History:  Past Medical History:  Diagnosis Date   Anxiety    Bladder infection    8/18   Chronic lower back pain    Chronic neck pain    Collagen vascular disease ([place])    COVID-19    Depression    Diverticulitis    GERD (gastroesophageal reflux disease)    Hyperlipidemia    Lupus ([place])    Overactive bladder     Past Surgical History:  Procedure Laterality Date   ABDOMINAL HYSTERECTOMY     COLONOSCOPY     COLONOSCOPY WITH PROPOFOL N/A 07/03/2017   Procedure: COLONOSCOPY WITH PROPOFOL;  Surgeon: [name], MD;  Location: [facility] ENDOSCOPY;  Service: Endoscopy;  Laterality: N/A;   COLONOSCOPY WITH PROPOFOL N/A 10/21/2017   Procedure: COLONOSCOPY WITH PROPOFOL;  Surgeon: [name], MD;  Location: [facility] ENDOSCOPY;  Service: Endoscopy;  Laterality: N/A;   ESOPHAGOGASTRODUODENOSCOPY N/A 10/21/2017   Procedure: ESOPHAGOGASTRODUODENOSCOPY (EGD);  Surgeon: [name], MD;  Location[facility] ENDOSCOPY;  Service: Endoscopy;  Laterality: N/A;   TONSILLECTOMY      Family Psychiatric History: Reviewed family psychiatric history from progress note on 02/06/2021  Family History:  Family History  Problem Relation Age of Onset  Depression Mother    Alcohol abuse Mother    Cancer Mother    Hypertension Mother    Emphysema Mother    Stroke Mother    Glaucoma Father    Heart disease Father    Diabetes Brother    Drug abuse Son     Social History: Reviewed social history from progress note on 02/06/2021 Social History   Socioeconomic History   Marital status: Married    Spouse name: Not on file   Number of children: Not on file   Years of  education: Not on file   Highest education level: Not on file  Occupational History   Not on file  Tobacco Use   Smoking status: Every Day    Packs/day: 1.00    Years: 35.00    Pack years: 35.00    Types: Cigarettes    Passive exposure: Past   Smokeless tobacco: Never   Tobacco comments:    1ppd - 03/01/2021  Vaping Use   Vaping Use: Never used  Substance and Sexual Activity   Alcohol use: No   Drug use: No   Sexual activity: Not Currently  Other Topics Concern   Not on file  Social History Narrative   Not on file   Social Determinants of Health   Financial Resource Strain: Not on file  Food Insecurity: Not on file  Transportation Needs: Not on file  Physical Activity: Not on file  Stress: Not on file  Social Connections: Not on file    Allergies:  Allergies  Allergen Reactions   Pregabalin Other (See Comments)    unknown   Trazodone Other (See Comments)    Agitation   Bupropion Anxiety   Methocarbamol Other (See Comments)    Agitation and stiffness   Tramadol Other (See Comments)    Nausea And Vomiting Reports she feels "high"    Metabolic Disorder Labs: No results found for: HGBA1C, MPG No results found for: PROLACTIN No results found for: CHOL, TRIG, HDL, CHOLHDL, VLDL, LDLCALC Lab Results  Component Value Date   TSH 3.347 02/07/2021    Therapeutic Level Labs: No results found for: LITHIUM No results found for: VALPROATE No components found for:  CBMZ  Current Medications: Current Outpatient Medications  Medication Sig Dispense Refill   busPIRone (BUSPAR) 15 MG tablet Take 1 tablet (15 mg total) by mouth 2 (two) times daily. 180 tablet 0   DULoxetine (CYMBALTA) 20 MG capsule Take 1 capsule (20 mg total) by mouth daily. Stop taking after a week 7 capsule 0   eszopiclone (LUNESTA) 2 MG TABS tablet Take 1 tablet (2 mg total) by mouth at bedtime as needed for sleep. Take immediately before bedtime 30 tablet 0   venlafaxine XR (EFFEXOR-XR) 37.5 MG 24  hr capsule Take 1 capsule (37.5 mg total) by mouth daily with breakfast. 30 capsule 0   atorvastatin (LIPITOR) 80 MG tablet Take 80 mg by mouth every evening.     gabapentin (NEURONTIN) 300 MG capsule Take 1 capsule (300 mg total) by mouth 3 (three) times daily. 90 capsule 2   hydroxychloroquine (PLAQUENIL) 200 MG tablet Take 200 mg by mouth in the morning.     oxyCODONE (OXY IR/ROXICODONE) 5 MG immediate release tablet Take 1 tablet (5 mg total) by mouth 2 (two) times daily. Must last 30 days 60 tablet 0   oxyCODONE (OXY IR/ROXICODONE) 5 MG immediate release tablet Take 1 tablet (5 mg total) by mouth 2 (two) times daily. Must last 30 days  60 tablet 0   [START ON 07/06/2021] oxyCODONE (OXY IR/ROXICODONE) 5 MG immediate release tablet Take 1 tablet (5 mg total) by mouth 2 (two) times daily. Must last 30 days 60 tablet 0   VENTOLIN HFA 108 (90 Base) MCG/ACT inhaler INHALE 2 PUFFS INTO THE LUNGS EVERY 6 HOURS AS NEEDED FOR WHEEZING OR SHORTNESS OF BREATH 18 g 2   No current facility-administered medications for this visit.   Facility-Administered Medications Ordered in Other Visits  Medication Dose Route Frequency Provider Last Rate Last Admin   albuterol (PROVENTIL) (2.5 MG/3ML) 0.083% nebulizer solution 2.5 mg  2.5 mg Nebulization Once Tyler Pita, MD         Musculoskeletal: Strength & Muscle Tone: within normal limits Gait & Station: normal Patient leans: N/A  Psychiatric Specialty Exam: Review of Systems  Musculoskeletal:  Positive for arthralgias and back pain.  Psychiatric/Behavioral:  Positive for decreased concentration and sleep disturbance. The patient is nervous/anxious.   All other systems reviewed and are negative.  Blood pressure 132/85, pulse 76, temperature 98.3 F (36.8 C), temperature source Temporal, weight 150 lb 12.8 oz (68.4 kg).Body mass index is 26.71 kg/m.  General Appearance: Casual  Eye Contact:  Fair  Speech:  Clear and Coherent  Volume:  Normal   Mood:  Anxious  Affect:  Congruent  Thought Process:  Goal Directed and Descriptions of Associations: Intact  Orientation:  Full (Time, Place, and Person)  Thought Content: Rumination   Suicidal Thoughts:  No  Homicidal Thoughts:  No  Memory:  Immediate;   Fair Recent;   Fair Remote;   Fair  Judgement:  Fair  Insight:  Fair  Psychomotor Activity:  Normal  Concentration:  Concentration: Fair and Attention Span: Fair  Recall:  [employer] of Knowledge: Fair  Language: Fair  Akathisia:  No  Handed:  Right  AIMS (if indicated): done  Assets:  Communication Skills Desire for Improvement Housing Social Support  ADL's:  Intact  Cognition: WNL  Sleep:  Poor   Screenings: [place] Office Visit from 06/22/2021 in [place] Total Score 0      [place] Visit from 06/22/2021 in [facility] from 04/16/2021 in [place] Visit from 02/06/2021 in [facility]  Total GAD-7 Score '16 19 19      '$ PHQ2-9    [place] Office Visit from 06/22/2021 in [facility] Office Visit from 05/11/2021 in [facility] Office Visit from 05/02/2021 in [facility] Counselor from 04/16/2021 in [facility] Office Visit from 03/23/2021 in [facility]  PHQ-2 Total Score 0 2 0 6 1  PHQ-9 Total Score 7 6 -- 13 5      Flowsheet Row Counselor from 04/16/2021 in [place] Visit from 03/23/2021 in [facility] from 03/08/2021 in [facility] No Risk No Risk No Risk        Assessment and Plan: Tanya [name] is a 61 year old Caucasian  female on disability, separated, lives in [place] with her daughter, has a history of depression, anxiety, chronic pain, GERD, SLE, fibromyalgia, hyperlipidemia, chronic discoid lupus was evaluated in office today.  Patient with anxiety and sleep problems as well as chronic pain.  Will benefit from the  following plan.  Plan GAD-unstable We will reduce the dosage of BuSpar due to adverse side effects. Start BuSpar 15 mg p.o. twice daily. Taper of Cymbalta.  Patient advised to start taking Cymbalta 20 mg p.o. daily for the next 1 week and stop taking it Start venlafaxine extended release 37.5 mg p.o. daily with breakfast. Continue CBT with Ms. Christina Hussami  MDD in partial remission Continue CBT Continue medications as noted above.  Primary insomnia-unstable Increase Lunesta to 2 mg p.o. nightly Patient will also need sufficient pain management  Tobacco use disorder-unstable Provided counseling for 1 minute.  Follow-up in clinic in 2 weeks or sooner if needed.  This note was generated in part or whole with voice recognition software. Voice recognition is usually quite accurate but there are transcription errors that can and very often do occur. I apologize for any typographical errors that were not detected and corrected.     Ursula Alert, MD 06/22/2021, 2:11 PM

## 2021-06-25 ENCOUNTER — Ambulatory Visit: Payer: Medicaid Other | Attending: Pain Medicine | Admitting: Pain Medicine

## 2021-06-25 ENCOUNTER — Encounter: Payer: Self-pay | Admitting: Pain Medicine

## 2021-06-25 VITALS — BP 143/86 | HR 97 | Temp 97.3°F | Resp 16 | Ht 63.0 in | Wt 150.0 lb

## 2021-06-25 DIAGNOSIS — M542 Cervicalgia: Secondary | ICD-10-CM | POA: Insufficient documentation

## 2021-06-25 DIAGNOSIS — G894 Chronic pain syndrome: Secondary | ICD-10-CM | POA: Diagnosis not present

## 2021-06-25 DIAGNOSIS — G8929 Other chronic pain: Secondary | ICD-10-CM | POA: Diagnosis present

## 2021-06-25 DIAGNOSIS — Z79891 Long term (current) use of opiate analgesic: Secondary | ICD-10-CM | POA: Insufficient documentation

## 2021-06-25 DIAGNOSIS — M549 Dorsalgia, unspecified: Secondary | ICD-10-CM | POA: Diagnosis not present

## 2021-06-25 DIAGNOSIS — M47812 Spondylosis without myelopathy or radiculopathy, cervical region: Secondary | ICD-10-CM | POA: Insufficient documentation

## 2021-06-25 DIAGNOSIS — Z79899 Other long term (current) drug therapy: Secondary | ICD-10-CM | POA: Insufficient documentation

## 2021-06-25 DIAGNOSIS — M7918 Myalgia, other site: Secondary | ICD-10-CM | POA: Insufficient documentation

## 2021-06-25 DIAGNOSIS — M545 Low back pain, unspecified: Secondary | ICD-10-CM | POA: Insufficient documentation

## 2021-06-25 DIAGNOSIS — M25512 Pain in left shoulder: Secondary | ICD-10-CM | POA: Diagnosis present

## 2021-06-25 DIAGNOSIS — M25511 Pain in right shoulder: Secondary | ICD-10-CM | POA: Diagnosis present

## 2021-06-25 NOTE — Progress Notes (Signed)
Nursing Pain Medication Assessment:  Safety precautions to be maintained throughout the outpatient stay will include: orient to surroundings, keep bed in low position, maintain call bell within reach at all times, provide assistance with transfer out of bed and ambulation.  Medication Inspection Compliance: [name] did not comply with our request to bring her pills to be counted. She was reminded that bringing the medication bottles, even when empty, is a requirement.  Medication: None brought in. Pill/Patch Count: None available to be counted. Bottle Appearance: No container available. Did not bring bottle(s) to appointment. Filled Date: N/A Last Medication intake:   May 23. 2023 States threw away all remaining tablets

## 2021-06-25 NOTE — Progress Notes (Signed)
PROVIDER NOTE: Information contained herein reflects review and annotations entered in association with encounter. Interpretation of such information and data should be left to medically-trained personnel. Information provided to patient can be located elsewhere in the medical record under "Patient Instructions". Document created using STT-dictation technology, any transcriptional errors that may result from process are unintentional.    Patient: Tanya [name]  Service Category: E/M  Provider: Gaspar Cola, MD  DOB: [birth year]  DOS: 06/25/2021  Specialty: Interventional Pain Management  MRN: [id number]  Setting: Ambulatory outpatient  PCP: Leonel Ramsay, MD  Type: Established Patient    Referring Provider: Leonel Ramsay, MD  Location: Office  Delivery: Face-to-face     HPI  [name], a 61 y.o. year old female, is here today because of her Chronic pain syndrome [G89.4]. Ms. Yoshino primary complain today is Neck Pain (bilateral) Last encounter: My last encounter with her was on 05/02/2021. Pertinent problems: Ms. Niehoff has Chronic upper back pain (2ry area of Pain) (Bilateral) (R>L); Chronic low back pain (3ry area of Pain) (Bilateral) (R>L) w/o sciatica; Chronic neck pain (1ry area of Pain) (Bilateral) (R>L); Fibromyalgia; Osteoarthritis; Chronic pain syndrome; Chronic upper extremity pain (Right); Chronic cervical radicular pain (Right); Chronic shoulder pain (Bilateral) (R>L); Chronic musculoskeletal pain; Cervical spondylosis; Cervical facet syndrome (Bilateral) (R>L); Shoulder radicular pain (Bilateral) (R>L); Acute postoperative pain; Spondylosis without myelopathy or radiculopathy, cervical region; Cervicalgia; Occipital headache (Left); Neurogenic pain; Cervico-occipital neuralgia (Left); Chronic elbow pain (Left); Osteoarthritis of AC (acromioclavicular) joint (Right); Chronic acromioclavicular joint pain (Right); Chronic shoulder pain (Right);  Epicondylitis elbow, medial (Left); 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 Pain Assessment: Severity of Chronic pain is reported as a 3 /10. Location: Neck Right, Left/both shoulders. Onset: More than a month ago. Quality: Aching, Dull. Timing: Intermittent. Modifying factor(s): rest, lying in chair, heat, Aleve, Tylenol. Vitals:  height is 5' 3"  (1.6 m) and weight is 150 lb (68 kg). Her tympanic temperature is 97.3 F (36.3 C) (abnormal). Her blood pressure is 143/86 (abnormal) and her pulse is 97. Her respiration is 16 and oxygen saturation is 99%.   Reason for encounter: medication management.  The patient indicates having taken the decision to completely come off of the opioids.  She slowly taper them down and threw away the ones that she had left.  She refers that she is doing good without them.  She refers taking some APAP and ibuprofen PRN to control the pain.  Today I have given her some recommendations with regards to the use of these 2 OTC medications.  I have instructed her to never take more than 6 acetaminophen pills per day and to always take the NSAIDs with some food to minimize the possible GI side effects.  She understood and accepted.  She refers that the best benefit that she attained was with the radiofrequency ablation of the cervical facets.  Since having had that done, her needs for the pain medicine decreased significantly and she was able to completely stop them.  At this point she is doing well and she refers not needing anything else but she wanted to come in just to let us know what she had done and how she was  doing after the  RFA.  We will continue to see her PRN basis for interventional therapies as needed.  Nonopioids transfer 12/22/2019: Baclofen and Neurontin  Pharmacotherapy Assessment  Analgesic: None MME/day: 0 mg/day.   Monitoring: [place] PMP: PDMP reviewed during this encounter.       Pharmacotherapy: No side-effects or adverse reactions reported. Compliance: No problems identified. Effectiveness: Clinically acceptable.  Landis Martins, RN  06/25/2021 10:45 AM  Sign when Signing Visit Nursing Pain Medication Assessment:  Safety precautions to be maintained throughout the outpatient stay will include: orient to surroundings, keep bed in low position, maintain call bell within reach at all times, provide assistance with transfer out of bed and ambulation.  Medication Inspection Compliance: Ms. Kraska did not comply with our request to bring her pills to be counted. She was reminded that bringing the medication bottles, even when empty, is a requirement.  Medication: None brought in. Pill/Patch Count: None available to be counted. Bottle Appearance: No container available. Did not bring bottle(s) to appointment. Filled Date: N/A Last Medication intake:   May 23. 2023 States threw away all remaining tablets    UDS:  Summary  Date Value Ref Range Status  01/31/2021 Note  Final    Comment:    ==================================================================== [employer] (MW) ==================================================================== Test                             Result       Flag       Units  Drug Present and Declared for Prescription Verification   Oxazepam                       187          EXPECTED   ng/mg creat    Oxazepam may be administered as a scheduled prescription medication;    it is also an expected metabolite of other benzodiazepine drugs,    including diazepam, chlordiazepoxide, prazepam, clorazepate,    halazepam, and temazepam.    Oxycodone                       972          EXPECTED   ng/mg creat   Oxymorphone                    197          EXPECTED   ng/mg creat   Noroxycodone                   500          EXPECTED   ng/mg creat    Sources of oxycodone include scheduled prescription medications.    Oxymorphone and noroxycodone are expected metabolites of oxycodone.    Oxymorphone is also available as a scheduled prescription medication.  ==================================================================== Test                      Result    Flag   Units      Ref Range   Creatinine              39               mg/dL      >=20 ==================================================================== Declared Medications:  The flagging and interpretation on this report are based on the  following declared medications.  Unexpected results may arise from  inaccuracies in the declared medications.   **Note: The testing scope of this panel includes these medications:   Diazepam (Valium)  Oxycodone (Roxicodone)   **Note: The testing scope of this panel does not include the  following reported medications:   Albuterol (Ventolin HFA)  Atorvastatin (Lipitor)  Baclofen (Lioresal)  Buspirone (Buspar)  Dextromethorphan  Duloxetine (Cymbalta)  Gabapentin (Neurontin)  Promethazine ==================================================================== For clinical consultation, please call [phone]. ====================================================================      ROS  Constitutional: Denies any fever or chills Gastrointestinal: No reported hemesis, hematochezia, vomiting, or acute GI distress Musculoskeletal: Denies any acute onset joint swelling, redness, loss of ROM, or weakness Neurological: No reported episodes of acute onset apraxia, aphasia, dysarthria, agnosia, amnesia, paralysis, loss of coordination, or loss of consciousness  Medication Review  DULoxetine, albuterol, atorvastatin, busPIRone, eszopiclone,  gabapentin, hydroxychloroquine, and venlafaxine XR  History Review  Allergy: Ms. Allender is allergic to pregabalin, trazodone, bupropion, methocarbamol, and tramadol. Drug: Ms. Talwar  reports no history of drug use. Alcohol:  reports no history of alcohol use. Tobacco:  reports that she has been smoking cigarettes. She has a 35.00 pack-year smoking history. She has been exposed to tobacco smoke. She has never used smokeless tobacco. Social: Ms. Brickner  reports that she has been smoking cigarettes. She has a 35.00 pack-year smoking history. She has been exposed to tobacco smoke. She has never used smokeless tobacco. She reports that she does not drink alcohol and does not use drugs. Medical:  has a past medical history of Anxiety, Bladder infection, Chronic lower back pain, Chronic neck pain, Collagen vascular disease ([place]), COVID-19, Depression, Diverticulitis, GERD (gastroesophageal reflux disease), Hyperlipidemia, Lupus ([place]), and Overactive bladder. Surgical: Ms. Enerson  has a past surgical history that includes Abdominal hysterectomy; Colonoscopy; Colonoscopy with propofol (N/A, 07/03/2017); Tonsillectomy; Esophagogastroduodenoscopy (N/A, 10/21/2017); and Colonoscopy with propofol (N/A, 10/21/2017). Family: family history includes Alcohol abuse in her mother; Cancer in her mother; Depression in her mother; Diabetes in her brother; Drug abuse in her son; Emphysema in her mother; Glaucoma in her father; Heart disease in her father; Hypertension in her mother; Stroke in her mother.  Laboratory Chemistry Profile   Renal Lab Results  Component Value Date   BUN 7 06/09/2019   CREATININE 0.72 06/09/2019   BCR 15 09/03/2017   GFRAA >60 06/09/2019   GFRNONAA >60 06/09/2019    Hepatic Lab Results  Component Value Date   AST 26 06/09/2019   ALT 20 06/09/2019   ALBUMIN 4.1 06/09/2019   ALKPHOS 29 (L) 06/09/2019   LIPASE 35 06/09/2019    Electrolytes Lab Results  Component Value  Date   NA 135 06/09/2019   K 4.2 06/09/2019   CL 102 06/09/2019   CALCIUM 8.4 (L) 06/09/2019   MG 1.9 09/03/2017    Bone Lab Results  Component Value Date   25OHVITD1 25 (L) 09/03/2017   25OHVITD2 2.9 09/03/2017   25OHVITD3 22 09/03/2017    Inflammation (CRP: Acute Phase) (ESR: Chronic Phase) Lab Results  Component Value Date   CRP 2 09/03/2017   ESRSEDRATE 21 09/03/2017         Note: Above Lab results reviewed.  Recent Imaging Review  CT CHEST WO CONTRAST CLINICAL DATA:  Lung nodule.  EXAM: CT CHEST WITHOUT CONTRAST  TECHNIQUE: Multidetector CT imaging of the chest was performed following the standard protocol without IV contrast.  RADIATION DOSE REDUCTION: This exam was performed according to the departmental dose-optimization program which includes automated exposure control, adjustment of the mA and/or  kV according to patient size and/or use of iterative reconstruction technique.  COMPARISON:  03/09/2021, 02/27/2021. PET 03/09/2021, MR abdomen 12/24/2020.  FINDINGS: Cardiovascular: Aberrant right subclavian artery. Atherosclerotic calcification of the aorta, aortic valve and coronary arteries. Heart size normal. No pericardial effusion.  Mediastinum/Nodes: No pathologically enlarged mediastinal or axillary lymph nodes. Hilar regions are difficult to definitively evaluate without IV contrast. Esophagus is grossly unremarkable.  Lungs/Pleura: Centrilobular and paraseptal emphysema. Smoking related respiratory bronchiolitis. Part solid irregular nodule in the left upper lobe overall measures 1.7 x 2.4 cm with an internal solid component measuring 1.2 x 1.4 cm (3/38), as on lung cancer screening CT 02/27/2021. Additional scattered small pulmonary nodules, as on 02/27/2021. No pleural fluid. Airway is unremarkable.  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 Visualized portions of the liver, gallbladder, adrenal glands, kidneys, spleen, pancreas, stomach and bowel are grossly unremarkable.  Musculoskeletal: Mild degenerative changes in the spine. No worrisome lytic or sclerotic lesions.  IMPRESSION: 1. Part solid irregular left upper lobe nodule, stable from 02/27/2021 and highly indicative of primary bronchogenic carcinoma. 2. Additional scattered small pulmonary nodules, as on 02/27/2021. Recommend continued attention on follow-up. 3.  Emphysema (ICD10-J43.9). 4. Aortic atherosclerosis (ICD10-I70.0). Coronary artery calcification.  Electronically Signed   By: Lorin Picket M.D.   On: 06/08/2021 12:11 Note: Reviewed        Physical Exam  General appearance: Well nourished, well developed, and well hydrated. In no apparent acute distress Mental status: Alert, oriented x 3 (person, place, & time)       Respiratory: No evidence of acute respiratory distress Eyes: PERLA Vitals: BP (!) 143/86   Pulse 97   Temp (!) 97.3 F (36.3 C) (Tympanic)   Resp 16   Ht 5' 3"  (1.6 m)   Wt 150 lb (68 kg)   SpO2 99%   BMI 26.57 kg/m  BMI: Estimated body mass index is 26.57 kg/m as calculated from the following:   Height as of this encounter: 5' 3"  (1.6 m).   Weight as of this encounter: 150 lb (68 kg). Ideal: Ideal body weight: 52.4 kg (115 lb 8.3 oz) Adjusted ideal body weight: 58.7 kg (129 lb 5 oz)  Assessment   Diagnosis Status  1. Chronic pain syndrome   2. Chronic neck pain (1ry area of Pain) (Bilateral) (R>L)   3. Chronic upper back pain (2ry area of Pain) (Bilateral) (R>L)   4. Chronic low back pain (3ry area of Pain) (Bilateral) (R>L) w/o sciatica   5. Cervical facet syndrome (Bilateral) (R>L)   6. Cervical spine pain   7. Cervicalgia   8. Chronic musculoskeletal pain   9. Chronic shoulder pain (Bilateral) (R>L)   10. Pharmacologic therapy   11. Chronic use of opiate for  therapeutic purpose   12. Encounter for medication management   13. Encounter for chronic pain management    Controlled Controlled Controlled   Updated Problems: No problems updated.  Plan of Care  Problem-specific:  No problem-specific Assessment & Plan notes found for this encounter.  Ms. CLOEE DUNWOODY has a current medication list which includes the following long-term medication(s): duloxetine, eszopiclone, gabapentin, venlafaxine xr, and ventolin hfa.  Pharmacotherapy (Medications Ordered): No orders of the defined types were placed in this encounter.  Orders:  No orders of the defined types were placed in this encounter.  Follow-up plan:  Return if symptoms worsen or fail to improve.     Interventional Therapies  Risk  Complexity Considerations:   Estimated body mass index is 26.57 kg/m as calculated from the following:   Height as of this encounter: 5' 3"  (1.6 m).   Weight as of this encounter: 150 lb (68 kg). WNL   Planned  Pending:      Under consideration:      Completed:   Therapeutic left IA shoulder joint inj. x1 (12/30/2019) (100/100/100 x 5 days/0)  Palliative right IA shoulder joint inj. x2 (10/06/2018) (100/100/50/50)  Palliative right AC joint inj. x3 (04/02/2018) (N/A)  Palliative left medial epicondyle elbow inj. x1 (04/02/2018) (N/A)  Diagnostic left GONB x1 (12/25/2017) (N/A)  Palliative right CESI x2 (02/12/2016) (N/A) Therapeutic left C7-T1 cervical ESI x1 (03/18/2016) (100/50/0/0)  Palliative bilateral cervical facet MBB x2 (06/26/2016)  Palliative right cervical facet RFA x3 (04/13/2020) (100/100/75/75)  Palliative left cervical facet RFA x3 (03/14/2020) (100/100/75/75)    Therapeutic  Palliative (PRN) options:   Palliative cervical facet MBB  Palliative cervical facet RFA      Recent Visits Date Type Provider Dept  05/02/21 Office Visit Milinda Pointer, MD [facility]  Showing recent visits within past 90 days  and meeting all other requirements Today's Visits Date Type Provider Dept  06/25/21 Office Visit Milinda Pointer, MD [facility]  Showing today's visits and meeting all other requirements Future Appointments Date Type Provider Dept  08/01/21 Appointment Milinda Pointer, MD [facility]  Showing future appointments within next 90 days and meeting all other requirements  I discussed the assessment and treatment plan with the patient. The patient was provided an opportunity to ask questions and all were answered. The patient agreed with the plan and demonstrated an understanding of the instructions.  Patient advised to call back or seek an in-person evaluation if the symptoms or condition worsens.  Duration of encounter: 30 minutes.  Note by: Gaspar Cola, MD Date: 06/25/2021; Time: 11:24 AM

## 2021-06-30 ENCOUNTER — Ambulatory Visit
Admission: RE | Admit: 2021-06-30 | Discharge: 2021-06-30 | Disposition: A | Discharge status: Home / Self Care | Payer: Medicaid Other | Source: Ambulatory Visit | Attending: Physician Assistant | Admitting: Physician Assistant

## 2021-06-30 ENCOUNTER — Ambulatory Visit (INDEPENDENT_AMBULATORY_CARE_PROVIDER_SITE_OTHER): Payer: Medicaid Other

## 2021-06-30 VITALS — BP 119/76 | HR 94 | Temp 98.3°F | Resp 18 | Ht 63.0 in | Wt 152.0 lb

## 2021-06-30 DIAGNOSIS — R0989 Other specified symptoms and signs involving the circulatory and respiratory systems: Secondary | ICD-10-CM

## 2021-06-30 DIAGNOSIS — R059 Cough, unspecified: Secondary | ICD-10-CM

## 2021-06-30 DIAGNOSIS — J40 Bronchitis, not specified as acute or chronic: Secondary | ICD-10-CM

## 2021-06-30 DIAGNOSIS — F17200 Nicotine dependence, unspecified, uncomplicated: Secondary | ICD-10-CM

## 2021-06-30 MED ORDER — DOXYCYCLINE HYCLATE 100 MG PO CAPS: 100.0000 mg | ORAL_CAPSULE | Freq: Two times a day (BID) | ORAL | 0 refills | Status: DC

## 2021-06-30 MED ORDER — PREDNISONE 20 MG PO TABS: 20.0000 mg | ORAL_TABLET | Freq: Every day | ORAL | 0 refills | Status: DC

## 2021-06-30 MED ORDER — PROMETHAZINE-PHENYLEPHRINE 6.25-5 MG/5ML PO SYRP: 5.0000 mL | ORAL_SOLUTION | Freq: Three times a day (TID) | ORAL | 0 refills | Status: DC | PRN

## 2021-06-30 NOTE — Discharge Instructions (Signed)
Continue using your inhaler every 4 hours and avoid smoking

## 2021-06-30 NOTE — ED Triage Notes (Signed)
Patient c/o productive cough and chest congestion for over a week.  Patient denies fevers.

## 2021-06-30 NOTE — ED Provider Notes (Signed)
[facility]    CSN: [id number] Arrival date & time: 06/30/21  1051      History   Chief Complaint Chief Complaint  Patient presents with   Cough    APPOINTMENT   Shortness of Breath    HPI Tanya [name] is a 61 y.o. female who presents with coug and chest congestion x 12 days. Started with her allergies and ends up having bronchitis every year around this time. Has felt she had a fever, but has notchecked. Has been sweating. She did not do a Covid test since she does not feel as bad. Has been wheezing and having cough attacks.  Has not had a fever. Her cough is productive with cream color mucous.     Past Medical History:  Diagnosis Date   Anxiety    Bladder infection    8/18   Chronic lower back pain    Chronic neck pain    Collagen vascular disease ([place])    COVID-19    Depression    Diverticulitis    GERD (gastroesophageal reflux disease)    Hyperlipidemia    Lupus (HCC)    Overactive bladder     Patient Active Problem List   Diagnosis Date Noted   Moderate episode of recurrent major depressive disorder ([place]) 03/23/2021   Tobacco use disorder 03/23/2021   Insomnia 02/06/2021   At risk for prolonged QT interval syndrome 02/06/2021   Psychiatric illness 11/01/2020   Chronic use of opiate for therapeutic purpose 06/25/2020   Arthropathy of cervical facet joint (Bilateral) 04/13/2020   Cervical spine pain 04/13/2020   Abnormal MRI, cervical spine (09/20/2015) 04/13/2020   Cervical central spinal stenosis (Multilevel) 04/13/2020   Cervical foraminal stenosis (Right: C3-4) (Bilateral: C4-5, C5-6) 04/13/2020   DDD (degenerative disc disease), cervical 03/14/2020   Chronic shoulder pain (Left) 09/06/2019   Osteoarthritis of shoulder (Left) 09/06/2019   Osteoarthritis of shoulders (Bilateral) (R>L) 10/25/2018   Osteoarthritis of AC (acromioclavicular) joint (Right) 04/02/2018   Chronic acromioclavicular joint pain (Right) 04/02/2018   Chronic  shoulder pain (Right) 04/02/2018   Epicondylitis elbow, medial (Left) 04/02/2018   Chronic elbow pain (Left) 03/25/2018   Occipital headache (Left) 12/10/2017   Neurogenic pain 12/10/2017   Cervico-occipital neuralgia (Left) 12/10/2017   Pharmacologic therapy 09/01/2017   Disorder of skeletal system 09/01/2017   Problems influencing health status 09/01/2017   Spondylosis without myelopathy or radiculopathy, cervical region 08/05/2017   Cervicalgia 08/05/2017   Rash 05/13/2017   Acute postoperative pain 06/26/2016   Cervical spondylosis 04/15/2016   Cervical facet syndrome (Bilateral) (R>L) 04/15/2016   Shoulder radicular pain (Bilateral) (R>L) 04/15/2016   Chronic musculoskeletal pain 03/07/2016   Vitamin D insufficiency 12/19/2015   Anxiety 11/23/2015   Chronic low back pain (3ry area of Pain) (Bilateral) (R>L) w/o sciatica 11/23/2015   Chronic neck pain (1ry area of Pain) (Bilateral) (R>L) 11/23/2015   Long term prescription benzodiazepine use 11/23/2015   Long term current use of opiate analgesic 11/23/2015   Long term prescription opiate use 11/23/2015   Opiate use (10 MME/Day) 11/23/2015   Chronic pain syndrome 11/23/2015   Chronic upper extremity pain (Right) 11/23/2015   Chronic cervical radicular pain (Right) 11/23/2015   Chronic shoulder pain (Bilateral) (R>L) 11/23/2015   Hypercholesterolemia 06/20/2015   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   Encounter for long-term (current) use of high-risk medication 06/06/2015   Pain medication agreement signed 11/24/2014   Hyperlipidemia 01/25/2014   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   Diverticulitis 10/04/2012  Osteoarthritis 10/04/2012   Chronic upper back pain (2ry area of Pain) (Bilateral) (R>L) 08/17/2012   Urinary incontinence 08/17/2012   Fibromyalgia 06/04/2012   SLE (systemic lupus erythematosus) ([place]) 02/18/2012   Generalized anxiety disorder 01/07/2012   Major depressive disorder, recurrent ([place]) 09/29/2009     Past Surgical History:  Procedure Laterality Date   ABDOMINAL HYSTERECTOMY     COLONOSCOPY     COLONOSCOPY WITH PROPOFOL N/A 07/03/2017   Procedure: COLONOSCOPY WITH PROPOFOL;  Surgeon: [name], MD;  Location: [facility] ENDOSCOPY;  Service: Endoscopy;  Laterality: N/A;   COLONOSCOPY WITH PROPOFOL N/A 10/21/2017   Procedure: COLONOSCOPY WITH PROPOFOL;  Surgeon: [name], MD;  Location: [facility] ENDOSCOPY;  Service: Endoscopy;  Laterality: N/A;   ESOPHAGOGASTRODUODENOSCOPY N/A 10/21/2017   Procedure: ESOPHAGOGASTRODUODENOSCOPY (EGD);  Surgeon: [name], MD;  Location: [facility] ENDOSCOPY;  Service: Endoscopy;  Laterality: N/A;   TONSILLECTOMY      OB History   No obstetric history on file.      Home Medications    Prior to Admission medications   Medication Sig Start Date End Date Taking? Authorizing Provider  atorvastatin (LIPITOR) 80 MG tablet Take 80 mg by mouth every evening. 04/19/20  Yes [provider]  busPIRone (BUSPAR) 15 MG tablet Take 1 tablet (15 mg total) by mouth 2 (two) times daily. 06/22/21  Yes Ursula Alert, MD  doxycycline (VIBRAMYCIN) 100 MG capsule Take 1 capsule (100 mg total) by mouth 2 (two) times daily. 06/30/21  Yes Rodriguez-Southworth, Sunday Spillers, PA-C  DULoxetine (CYMBALTA) 20 MG capsule Take 1 capsule (20 mg total) by mouth daily. Stop taking after a week 06/22/21  Yes Eappen, Ria Clock, MD  eszopiclone (LUNESTA) 2 MG TABS tablet Take 1 tablet (2 mg total) by mouth at bedtime as needed for sleep. Take immediately before bedtime 06/22/21  Yes Eappen, Ria Clock, MD  gabapentin (NEURONTIN) 300 MG capsule Take 1 capsule (300 mg total) by mouth 3 (three) times daily. 06/26/20 03/03/23 Yes Milinda Pointer, MD  hydroxychloroquine (PLAQUENIL) 200 MG tablet Take 200 mg by mouth in the morning. 01/04/21  Yes [provider]  predniSONE (DELTASONE) 20 MG tablet Take 1 tablet (20 mg total) by mouth daily with breakfast. 06/30/21  Yes  Rodriguez-Southworth, Sunday Spillers, PA-C  promethazine-phenylephrine (PROMETHAZINE VC) 6.25-5 MG/5ML SYRP Take 5 mLs by mouth every 8 (eight) hours as needed for congestion (and cough). 06/30/21  Yes Rodriguez-Southworth, Sunday Spillers, PA-C  VENTOLIN HFA 108 (90 Base) MCG/ACT inhaler INHALE 2 PUFFS INTO THE LUNGS EVERY 6 HOURS AS NEEDED FOR WHEEZING OR SHORTNESS OF BREATH 05/21/21  Yes Tyler Pita, MD    Family History Family History  Problem Relation Age of Onset   Depression Mother    Alcohol abuse Mother    Cancer Mother    Hypertension Mother    Emphysema Mother    Stroke Mother    Glaucoma Father    Heart disease Father    Diabetes Brother    Drug abuse Son     Social History Social History   Tobacco Use   Smoking status: Every Day    Packs/day: 1.00    Years: 35.00    Total pack years: 35.00    Types: Cigarettes    Passive exposure: Past   Smokeless tobacco: Never   Tobacco comments:    1ppd - 03/01/2021  Vaping Use   Vaping Use: Never used  Substance Use Topics   Alcohol use: No   Drug use: No     Allergies  Pregabalin, Trazodone, Bupropion, Methocarbamol, and Tramadol   Review of Systems Review of Systems  Constitutional:  Positive for diaphoresis and fatigue. Negative for appetite change, chills and fever.  HENT:  Positive for postnasal drip and rhinorrhea. Negative for congestion, ear discharge, ear pain and sore throat.   Respiratory:  Positive for cough and wheezing.   Musculoskeletal:  Negative for myalgias.  Neurological:  Negative for headaches.     Physical Exam Triage Vital Signs ED Triage Vitals  Enc Vitals Group     BP 06/30/21 1107 119/76     Pulse Rate 06/30/21 1107 94     Resp 06/30/21 1107 18     Temp 06/30/21 1107 98.3 F (36.8 C)     Temp Source 06/30/21 1107 Oral     SpO2 06/30/21 1107 99 %     Weight 06/30/21 1104 152 lb (68.9 kg)     Height 06/30/21 1104 '5\' 3"'$  (1.6 m)     Head Circumference --      Peak Flow --      Pain Score  06/30/21 1104 0     Pain Loc --      Pain Edu? --      Excl. in [place]? --    No data found.  Updated Vital Signs BP 119/76 (BP Location: Right Arm)   Pulse 94   Temp 98.3 F (36.8 C) (Oral)   Resp 18   Ht '5\' 3"'$  (1.6 m)   Wt 152 lb (68.9 kg)   SpO2 99%   BMI 26.93 kg/m   Visual Acuity Right Eye Distance:   Left Eye Distance:   Bilateral Distance:    Right Eye Near:   Left Eye Near:    Bilateral Near:     Physical Exam Physical Exam Constitutional:      General: He is not in acute distress.    Appearance: He is not toxic-appearing.  HENT:     Head: Normocephalic.     Right Ear: Tympanic membrane, ear canal and external ear normal.     Left Ear: Ear canal and external ear normal.     Nose: Nose normal.     Mouth/Throat:     Mouth: Mucous membranes are moist.     Pharynx: Oropharynx is clear.  Eyes:     General: No scleral icterus.    Conjunctiva/sclera: Conjunctivae normal.  Cardiovascular:     Rate and Rhythm: Normal rate and regular rhythm.     Heart sounds: No murmur heard.   Pulmonary:     Effort: Pulmonary effort is normal. No respiratory distress.     Breath sounds: Wheezing present on RLL.     Comments: Has auditory wheezing Musculoskeletal:        General: Normal range of motion.     Cervical back: Neck supple.  Lymphadenopathy:     Cervical: No cervical adenopathy.  Skin:    General: Skin is warm and dry.     Findings: No rash.  Neurological:     Mental Status: He is alert and oriented to person, place, and time.     Gait: Gait normal.  Psychiatric:        Mood and Affect: Mood normal.        Behavior: Behavior normal.        Thought Content: Thought content normal.        Judgment: Judgment normal.    UC Treatments / Results  Labs (all labs ordered are listed, but only abnormal  results are displayed) Labs Reviewed - No data to display  EKG   Radiology DG Chest 2 View  Result Date: 06/30/2021 CLINICAL DATA:  cough and chest  congestion for over a week EXAM: CHEST - 2 VIEW COMPARISON:  Radiograph 03/14/2021, chest CT 06/07/2021 FINDINGS: Unchanged cardiomediastinal silhouette. Persistent left upper low pulmonary nodule as described on prior chest CT. There is no new airspace disease. There is no pleural effusion. No pneumothorax. No acute osseous abnormality. IMPRESSION: No new airspace disease. Persistent left upper lobe pulmonary nodule as described on prior chest CT. Electronically Signed   By: Maurine Simmering M.D.   On: 06/30/2021 11:23    Procedures Procedures (including critical care time)  Medications Ordered in UC Medications - No data to display  Initial Impression / Assessment and Plan / UC Course  I have reviewed the triage vital signs and the nursing notes.  Pertinent  imaging results that were available during my care of the patient were reviewed by me and considered in my medical decision making (see chart for details).   Bronchitis  I placed her on prednisone, Phenergan VC and Doxy as noted.  Final Clinical Impressions(s) / UC Diagnoses   Final diagnoses:  Bronchitis     Discharge Instructions      Continue using your inhaler every 4 hours and avoid smoking      ED Prescriptions     Medication Sig Dispense Auth. Provider   doxycycline (VIBRAMYCIN) 100 MG capsule Take 1 capsule (100 mg total) by mouth 2 (two) times daily. 20 capsule Rodriguez-Southworth, Sunday Spillers, PA-C   predniSONE (DELTASONE) 20 MG tablet Take 1 tablet (20 mg total) by mouth daily with breakfast. 5 tablet Rodriguez-Southworth, Sunday Spillers, PA-C   promethazine-phenylephrine (PROMETHAZINE VC) 6.25-5 MG/5ML SYRP Take 5 mLs by mouth every 8 (eight) hours as needed for congestion (and cough). 118 mL Rodriguez-Southworth, Sunday Spillers, PA-C      I have reviewed the PDMP during this encounter.   Shelby Mattocks, [place] 06/30/21 1132

## 2021-07-06 ENCOUNTER — Ambulatory Visit
Admission: EM | Admit: 2021-07-06 | Discharge: 2021-07-06 | Disposition: A | Discharge status: Home / Self Care | Payer: Medicaid Other | Attending: Emergency Medicine | Admitting: Emergency Medicine

## 2021-07-10 ENCOUNTER — Other Ambulatory Visit: Payer: Self-pay | Admitting: Psychiatry

## 2021-07-10 DIAGNOSIS — F5101 Primary insomnia: Secondary | ICD-10-CM

## 2021-07-12 ENCOUNTER — Ambulatory Visit
Admission: RE | Admit: 2021-07-12 | Discharge: 2021-07-12 | Disposition: A | Discharge status: Home / Self Care | Payer: Medicaid Other | Source: Ambulatory Visit | Attending: Emergency Medicine | Admitting: Emergency Medicine

## 2021-07-12 VITALS — BP 124/80 | HR 85 | Temp 98.2°F | Resp 20 | Ht 63.0 in | Wt 153.0 lb

## 2021-07-12 DIAGNOSIS — J209 Acute bronchitis, unspecified: Secondary | ICD-10-CM | POA: Diagnosis not present

## 2021-07-12 DIAGNOSIS — J069 Acute upper respiratory infection, unspecified: Secondary | ICD-10-CM | POA: Diagnosis not present

## 2021-07-12 MED ORDER — PROMETHAZINE-DM 6.25-15 MG/5ML PO SYRP: 5.0000 mL | ORAL_SOLUTION | Freq: Four times a day (QID) | ORAL | 0 refills | Status: DC | PRN

## 2021-07-12 MED ORDER — PREDNISONE 20 MG PO TABS: 60.0000 mg | ORAL_TABLET | Freq: Every day | ORAL | 0 refills | Status: AC

## 2021-07-12 MED ORDER — BENZONATATE 100 MG PO CAPS: 200.0000 mg | ORAL_CAPSULE | Freq: Three times a day (TID) | ORAL | 0 refills | Status: DC

## 2021-07-12 MED ORDER — AMOXICILLIN-POT CLAVULANATE 875-125 MG PO TABS: 1.0000 | ORAL_TABLET | Freq: Two times a day (BID) | ORAL | 0 refills | Status: AC

## 2021-07-12 NOTE — Discharge Instructions (Addendum)
Take the Augmentin twice daily for 10 days with food for treatment of your respiratory infection.  Use your albuterol inhaler, 2 puffs every 4-6 hours as needed for shortness breath or wheezing.  Take the prednisone 60 mg daily at breakfast time starting today.  Use the Tessalon Perles every 8 hours as needed for cough during the day.  They will sometimes cause numbness to the base of your tongue or give you metallic taste in her mouth.  This is normal.  You will need to take them with a small sip of water.  Use the Promethazine DM cough syrup at bedtime as needed for cough and congestion.  Return for reevaluation for new or worsening symptoms.  If your symptoms persist you should follow-up with pulmonology.

## 2021-07-12 NOTE — ED Provider Notes (Signed)
[facility]    CSN: [id number] Arrival date & time: 07/12/21  0932      History   Chief Complaint No chief complaint on file.   HPI Tanya [name] is a 61 y.o. female.   HPI  61 year old female here for evaluation respiratory complaint.  Patient was evaluated in this urgent care 2 weeks ago and diagnosed with bronchitis.  She was prescribed doxycycline at that time but states that it has not helped her symptoms.  She is continuing to experience a cough that is productive for yellow sputum, waxing and waning fevers with a Tmax of 101, sinus pressure with postnasal drip and yellow nasal discharge, shortness of breath, and wheezing.  She denies any ear.  She is followed by pulmonology but does not carry a diagnosis of COPD per her report and per chart review.  She does continue to smoke daily but reports that her current pulmonary illness has caused her to cut back on how much she can smoke.  Past Medical History:  Diagnosis Date   Anxiety    Bladder infection    8/18   Chronic lower back pain    Chronic neck pain    Collagen vascular disease ([place])    COVID-19    Depression    Diverticulitis    GERD (gastroesophageal reflux disease)    Hyperlipidemia    Lupus (HCC)    Overactive bladder     Patient Active Problem List   Diagnosis Date Noted   Moderate episode of recurrent major depressive disorder ([place]) 03/23/2021   Tobacco use disorder 03/23/2021   Insomnia 02/06/2021   At risk for prolonged QT interval syndrome 02/06/2021   Psychiatric illness 11/01/2020   Chronic use of opiate for therapeutic purpose 06/25/2020   Arthropathy of cervical facet joint (Bilateral) 04/13/2020   Cervical spine pain 04/13/2020   Abnormal MRI, cervical spine (09/20/2015) 04/13/2020   Cervical central spinal stenosis (Multilevel) 04/13/2020   Cervical foraminal stenosis (Right: C3-4) (Bilateral: C4-5, C5-6) 04/13/2020   DDD (degenerative disc disease), cervical 03/14/2020    Chronic shoulder pain (Left) 09/06/2019   Osteoarthritis of shoulder (Left) 09/06/2019   Osteoarthritis of shoulders (Bilateral) (R>L) 10/25/2018   Osteoarthritis of AC (acromioclavicular) joint (Right) 04/02/2018   Chronic acromioclavicular joint pain (Right) 04/02/2018   Chronic shoulder pain (Right) 04/02/2018   Epicondylitis elbow, medial (Left) 04/02/2018   Chronic elbow pain (Left) 03/25/2018   Occipital headache (Left) 12/10/2017   Neurogenic pain 12/10/2017   Cervico-occipital neuralgia (Left) 12/10/2017   Pharmacologic therapy 09/01/2017   Disorder of skeletal system 09/01/2017   Problems influencing health status 09/01/2017   Spondylosis without myelopathy or radiculopathy, cervical region 08/05/2017   Cervicalgia 08/05/2017   Rash 05/13/2017   Acute postoperative pain 06/26/2016   Cervical spondylosis 04/15/2016   Cervical facet syndrome (Bilateral) (R>L) 04/15/2016   Shoulder radicular pain (Bilateral) (R>L) 04/15/2016   Chronic musculoskeletal pain 03/07/2016   Vitamin D insufficiency 12/19/2015   Anxiety 11/23/2015   Chronic low back pain (3ry area of Pain) (Bilateral) (R>L) w/o sciatica 11/23/2015   Chronic neck pain (1ry area of Pain) (Bilateral) (R>L) 11/23/2015   Long term prescription benzodiazepine use 11/23/2015   Long term current use of opiate analgesic 11/23/2015   Long term prescription opiate use 11/23/2015   Opiate use (10 MME/Day) 11/23/2015   Chronic pain syndrome 11/23/2015   Chronic upper extremity pain (Right) 11/23/2015   Chronic cervical radicular pain (Right) 11/23/2015   Chronic shoulder pain (Bilateral) (R>L)  11/23/2015   Hypercholesterolemia 06/20/2015   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   Encounter for long-term (current) use of high-risk medication 06/06/2015   Pain medication agreement signed 11/24/2014   Hyperlipidemia 01/25/2014   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   Diverticulitis 10/04/2012   Osteoarthritis 10/04/2012    Chronic upper back pain (2ry area of Pain) (Bilateral) (R>L) 08/17/2012   Urinary incontinence 08/17/2012   Fibromyalgia 06/04/2012   SLE (systemic lupus erythematosus) ([place]) 02/18/2012   Generalized anxiety disorder 01/07/2012   Major depressive disorder, recurrent ([place]) 09/29/2009    Past Surgical History:  Procedure Laterality Date   ABDOMINAL HYSTERECTOMY     COLONOSCOPY     COLONOSCOPY WITH PROPOFOL N/A 07/03/2017   Procedure: COLONOSCOPY WITH PROPOFOL;  Surgeon: [name], MD;  Location: [facility] ENDOSCOPY;  Service: Endoscopy;  Laterality: N/A;   COLONOSCOPY WITH PROPOFOL N/A 10/21/2017   Procedure: COLONOSCOPY WITH PROPOFOL;  Surgeon: [name], MD;  Location: [facility] ENDOSCOPY;  Service: Endoscopy;  Laterality: N/A;   ESOPHAGOGASTRODUODENOSCOPY N/A 10/21/2017   Procedure: ESOPHAGOGASTRODUODENOSCOPY (EGD);  Surgeon: [name], MD;  Location: [facility] ENDOSCOPY;  Service: Endoscopy;  Laterality: N/A;   TONSILLECTOMY      OB History   No obstetric history on file.      Home Medications    Prior to Admission medications   Medication Sig Start Date End Date Taking? Authorizing Provider  amoxicillin-clavulanate (AUGMENTIN) 875-125 MG tablet Take 1 tablet by mouth every 12 (twelve) hours for 10 days. 07/12/21 07/22/21 Yes Margarette Canada, NP  atorvastatin (LIPITOR) 80 MG tablet Take 80 mg by mouth every evening. 04/19/20  Yes [provider]  benzonatate (TESSALON) 100 MG capsule Take 2 capsules (200 mg total) by mouth every 8 (eight) hours. 07/12/21  Yes Margarette Canada, NP  busPIRone (BUSPAR) 15 MG tablet Take 1 tablet (15 mg total) by mouth 2 (two) times daily. 06/22/21  Yes Ursula Alert, MD  DULoxetine (CYMBALTA) 20 MG capsule Take 1 capsule (20 mg total) by mouth daily. Stop taking after a week 06/22/21  Yes Eappen, Saramma, MD  eszopiclone (LUNESTA) 2 MG TABS tablet TAKE 1 TABLET BY MOUTH AT BEDTIME AS NEEDED FOR SLEEP. TAKE IMMEDIATELY BEFORE BED 08/07/21  Yes  Ursula Alert, MD  gabapentin (NEURONTIN) 300 MG capsule Take 1 capsule (300 mg total) by mouth 3 (three) times daily. 06/26/20 03/03/23 Yes Milinda Pointer, MD  hydroxychloroquine (PLAQUENIL) 200 MG tablet Take 200 mg by mouth in the morning. 01/04/21  Yes [provider]  predniSONE (DELTASONE) 20 MG tablet Take 3 tablets (60 mg total) by mouth daily with breakfast for 5 days. 3 tablets daily for 5 days. 07/12/21 07/17/21 Yes Margarette Canada, NP  promethazine-dextromethorphan (PROMETHAZINE-DM) 6.25-15 MG/5ML syrup Take 5 mLs by mouth 4 (four) times daily as needed. 07/12/21  Yes Margarette Canada, NP  promethazine-phenylephrine (PROMETHAZINE VC) 6.25-5 MG/5ML SYRP Take 5 mLs by mouth every 8 (eight) hours as needed for congestion (and cough). 06/30/21  Yes Rodriguez-Southworth, Sunday Spillers, PA-C  VENTOLIN HFA 108 (90 Base) MCG/ACT inhaler INHALE 2 PUFFS INTO THE LUNGS EVERY 6 HOURS AS NEEDED FOR WHEEZING OR SHORTNESS OF BREATH 05/21/21  Yes Tyler Pita, MD    Family History Family History  Problem Relation Age of Onset   Depression Mother    Alcohol abuse Mother    Cancer Mother    Hypertension Mother    Emphysema Mother    Stroke Mother    Glaucoma Father    Heart disease Father  Diabetes Brother    Drug abuse Son     Social History Social History   Tobacco Use   Smoking status: Every Day    Packs/day: 1.00    Years: 35.00    Total pack years: 35.00    Types: Cigarettes    Passive exposure: Past   Smokeless tobacco: Never   Tobacco comments:    1ppd - 03/01/2021  Vaping Use   Vaping Use: Never used  Substance Use Topics   Alcohol use: No   Drug use: No     Allergies   Pregabalin, Trazodone, Bupropion, Methocarbamol, and Tramadol   Review of Systems Review of Systems  Constitutional:  Positive for fever.  HENT:  Positive for congestion, rhinorrhea and sinus pressure. Negative for ear pain and sore throat.   Respiratory:  Positive for choking, shortness of breath  and wheezing.   Neurological:  Positive for headaches.       Frontal headache  Hematological: Negative.   Psychiatric/Behavioral: Negative.       Physical Exam Triage Vital Signs ED Triage Vitals  Enc Vitals Group     BP 07/12/21 0953 124/80     Pulse Rate 07/12/21 0953 85     Resp 07/12/21 0953 20     Temp 07/12/21 0953 98.2 F (36.8 C)     Temp src --      SpO2 07/12/21 0953 92 %     Weight 07/12/21 0951 153 lb (69.4 kg)     Height 07/12/21 0951 '5\' 3"'$  (1.6 m)     Head Circumference --      Peak Flow --      Pain Score 07/12/21 0951 2     Pain Loc --      Pain Edu? --      Excl. in [place]? --    No data found.  Updated Vital Signs BP 124/80 (BP Location: Left Arm)   Pulse 85   Temp 98.2 F (36.8 C)   Resp 20   Ht '5\' 3"'$  (1.6 m)   Wt 153 lb (69.4 kg)   SpO2 92%   BMI 27.10 kg/m   Visual Acuity Right Eye Distance:   Left Eye Distance:   Bilateral Distance:    Right Eye Near:   Left Eye Near:    Bilateral Near:     Physical Exam Vitals and nursing note reviewed.  Constitutional:      Appearance: Normal appearance. She is ill-appearing.  HENT:     Head: Normocephalic and atraumatic.     Right Ear: Tympanic membrane, ear canal and external ear normal. There is no impacted cerumen.     Left Ear: Tympanic membrane, ear canal and external ear normal. There is no impacted cerumen.     Nose: Congestion and rhinorrhea present.     Mouth/Throat:     Mouth: Mucous membranes are moist.     Pharynx: Oropharynx is clear. Posterior oropharyngeal erythema present. No oropharyngeal exudate.  Cardiovascular:     Rate and Rhythm: Normal rate and regular rhythm.     Pulses: Normal pulses.     Heart sounds: Normal heart sounds. No murmur heard.    No friction rub. No gallop.  Pulmonary:     Effort: Pulmonary effort is normal.     Breath sounds: Wheezing and rhonchi present. No rales.  Musculoskeletal:     Cervical back: Normal range of motion and neck supple.   Lymphadenopathy:     Cervical: No cervical adenopathy.  Skin:    General: Skin is warm and dry.     Capillary Refill: Capillary refill takes less than 2 seconds.     Findings: No erythema or rash.  Neurological:     General: No focal deficit present.     Mental Status: She is alert and oriented to person, place, and time.  Psychiatric:        Mood and Affect: Mood normal.        Behavior: Behavior normal.        Thought Content: Thought content normal.        Judgment: Judgment normal.      UC Treatments / Results  Labs (all labs ordered are listed, but only abnormal results are displayed) Labs Reviewed - No data to display  EKG   Radiology No results found.  Procedures Procedures (including critical care time)  Medications Ordered in UC Medications - No data to display  Initial Impression / Assessment and Plan / UC Course  I have reviewed the triage vital signs and the nursing notes.  Pertinent labs & imaging results that were available during my care of the patient were reviewed by me and considered in my medical decision making (see chart for details).  Patient is a mildly ill-appearing 61 year old female here for evaluation of ongoing cough and fever x2 weeks with development of sinus pressure, nasal discharge, and postnasal drip.  She reports her Tmax is 101.  She is in smoker and she is followed by pulmonology but does not carry diagnosis of COPD.  She was recently treated with doxycycline for bronchitis.  She does not feel like her symptoms have improved and actually feels like they have worsened.  Her physical exam reveals pearly-gray tympanic membranes bilaterally with normal light reflex and clear external auditory canals.  Her nasal mucosa is erythematous and edematous with yellow discharge in both nares.  Oropharyngeal exam reveals posterior oropharyngeal erythema with yellow postnasal drip.  She does have tenderness to percussion of bilateral frontal sinuses but  not her maxillary's.  No cervical lymphadenopathy appreciable exam.  Cardiopulmonary exam reveals wheezes and rhonchi in bilateral upper and middle lobes.  Bases are clear.  Patient's exam is consistent with bronchitis.  She also has an upper respiratory infection.  I will do a trial of Augmentin twice daily for 10 days and will also prescribe Tessalon Perles and Promethazine DM cough syrup to help with the cough and congestion.  Indicates that her cough is worse at night and she is having to sleep sitting up because of the drainage.  I have encouraged her to continue using her albuterol inhaler.  If her symptoms do not improve, or they worsen, she should follow-up with her pulmonologist.  You a 5-day burst dose of prednisone 60 mg daily.   Final Clinical Impressions(s) / UC Diagnoses   Final diagnoses:  Acute bronchitis, unspecified organism  Acute upper respiratory infection     Discharge Instructions      Take the Augmentin twice daily for 10 days with food for treatment of your respiratory infection.  Use your albuterol inhaler, 2 puffs every 4-6 hours as needed for shortness breath or wheezing.  Take the prednisone 60 mg daily at breakfast time starting today.  Use the Tessalon Perles every 8 hours as needed for cough during the day.  They will sometimes cause numbness to the base of your tongue or give you metallic taste in her mouth.  This is normal.  You will need to  take them with a small sip of water.  Use the Promethazine DM cough syrup at bedtime as needed for cough and congestion.  Return for reevaluation for new or worsening symptoms.  If your symptoms persist you should follow-up with pulmonology.     ED Prescriptions     Medication Sig Dispense Auth. Provider   amoxicillin-clavulanate (AUGMENTIN) 875-125 MG tablet Take 1 tablet by mouth every 12 (twelve) hours for 10 days. 20 tablet Margarette Canada, NP   benzonatate (TESSALON) 100 MG capsule Take 2 capsules (200 mg  total) by mouth every 8 (eight) hours. 21 capsule Margarette Canada, NP   promethazine-dextromethorphan (PROMETHAZINE-DM) 6.25-15 MG/5ML syrup Take 5 mLs by mouth 4 (four) times daily as needed. 118 mL Margarette Canada, NP   predniSONE (DELTASONE) 20 MG tablet Take 3 tablets (60 mg total) by mouth daily with breakfast for 5 days. 3 tablets daily for 5 days. 15 tablet Margarette Canada, NP      PDMP not reviewed this encounter.   Margarette Canada, NP 07/12/21 1016

## 2021-07-12 NOTE — ED Triage Notes (Signed)
Patient presents to UC states she was here about 2 weeks ago. Was given an antibiotic but it has not helped.   Patient has still had a cough, fevers.Marland Kitchen

## 2021-07-16 ENCOUNTER — Ambulatory Visit: Payer: Medicaid Other | Admitting: Psychiatry

## 2021-07-17 ENCOUNTER — Ambulatory Visit (INDEPENDENT_AMBULATORY_CARE_PROVIDER_SITE_OTHER): Payer: Medicaid Other | Admitting: Licensed Clinical Social Worker

## 2021-07-17 DIAGNOSIS — F411 Generalized anxiety disorder: Secondary | ICD-10-CM | POA: Diagnosis not present

## 2021-07-17 DIAGNOSIS — F3341 Major depressive disorder, recurrent, in partial remission: Secondary | ICD-10-CM | POA: Diagnosis not present

## 2021-07-19 ENCOUNTER — Encounter: Payer: Self-pay | Admitting: Psychiatry

## 2021-07-19 ENCOUNTER — Ambulatory Visit (INDEPENDENT_AMBULATORY_CARE_PROVIDER_SITE_OTHER): Payer: Medicaid Other | Admitting: Psychiatry

## 2021-07-19 VITALS — BP 135/82 | HR 92 | Temp 98.1°F | Wt 149.6 lb

## 2021-07-19 DIAGNOSIS — F411 Generalized anxiety disorder: Secondary | ICD-10-CM

## 2021-07-19 DIAGNOSIS — F5101 Primary insomnia: Secondary | ICD-10-CM

## 2021-07-19 DIAGNOSIS — F17200 Nicotine dependence, unspecified, uncomplicated: Secondary | ICD-10-CM

## 2021-07-19 DIAGNOSIS — F3342 Major depressive disorder, recurrent, in full remission: Secondary | ICD-10-CM | POA: Diagnosis not present

## 2021-07-19 MED ORDER — NICOTINE 14 MG/24HR TD PT24: 14.0000 mg | MEDICATED_PATCH | Freq: Every day | TRANSDERMAL | 0 refills | Status: DC

## 2021-07-19 MED ORDER — DULOXETINE HCL 30 MG PO CPEP: 30.0000 mg | ORAL_CAPSULE | Freq: Every day | ORAL | 0 refills | Status: DC

## 2021-07-19 NOTE — Progress Notes (Signed)
[facility] MD OP Progress Note  07/19/2021 6:58 PM Tanya [name]  MRN:  [id number]  Chief Complaint:  Chief Complaint  Patient presents with   Follow-up: 61 year old Caucasian female with history of depression, anxiety, sleep problems was evaluated for medication management.   HPI: Tanya [name] is a 61 year old Caucasian female on SSD, separated from husband, lives in [place], has a history of GAD, MDD, insomnia, chronic pain, fibromyalgia, osteoarthritis, SLE was evaluated in office today.  Patient today reports since the past few weeks she has been feeling better with regards to her mood.  Denies any sadness, anhedonia.  Denies any anxiety symptoms.  Patient reports she started talking to herself about her current situation and was able to pray about it.  She reports she also started going to church more frequently.  All that seems to be helpful.  Patient reports she did not want to stop taking the Cymbalta as discussed last visit.  She reports she has been on Cymbalta for a long time and that works for her.  She hence stayed on the Cymbalta 30 mg daily.  Stopped taking the BuSpar since she felt it was causing her some side effects.  She also did not start the venlafaxine as discussed.  Currently sleeping well on the Lunesta.  Reports she is currently not on any opioid medications for her pain.  She reports she flushed it down the toilet.  She is not interested in staying on these kind of medications anymore.  She reports her pain is better now that her attitude is better.  Denies any suicidality, homicidality or perceptual disturbances.  Patient denies any other concerns today.  Visit Diagnosis:    ICD-10-CM   1. GAD (generalized anxiety disorder)  F41.1 DULoxetine (CYMBALTA) 30 MG capsule    2. MDD (major depressive disorder), recurrent, in full remission ([place])  F33.42 DULoxetine (CYMBALTA) 30 MG capsule    3. Primary insomnia  F51.01     4. Tobacco use disorder  F17.200  nicotine (NICODERM CQ - DOSED IN MG/24 HOURS) 14 mg/24hr patch      Past Psychiatric History: Reviewed past psychiatric history from progress note on 02/06/2021.  Past trials of Cymbalta-higher dosage did not tolerate, trazodone-had side effects, Wellbutrin-side effects, Paxil-side effects, Prozac-side effects, Valium, doxepin, Trintellix, mirtazapine.  Past Medical History:  Past Medical History:  Diagnosis Date   Anxiety    Bladder infection    8/18   Chronic lower back pain    Chronic neck pain    Collagen vascular disease ([place])    COVID-19    Depression    Diverticulitis    GERD (gastroesophageal reflux disease)    Hyperlipidemia    Lupus (HCC)    Overactive bladder     Past Surgical History:  Procedure Laterality Date   ABDOMINAL HYSTERECTOMY     COLONOSCOPY     COLONOSCOPY WITH PROPOFOL N/A 07/03/2017   Procedure: COLONOSCOPY WITH PROPOFOL;  Surgeon: [name], MD;  Location[facility] ENDOSCOPY;  Service: Endoscopy;  Laterality: N/A;   COLONOSCOPY WITH PROPOFOL N/A 10/21/2017   Procedure: COLONOSCOPY WITH PROPOFOL;  Surgeon: [name], MD;  Location: [facility] ENDOSCOPY;  Service: Endoscopy;  Laterality: N/A;   ESOPHAGOGASTRODUODENOSCOPY N/A 10/21/2017   Procedure: ESOPHAGOGASTRODUODENOSCOPY (EGD);  Surgeon: [name], MD;  Location: [facility] ENDOSCOPY;  Service: Endoscopy;  Laterality: N/A;   TONSILLECTOMY      Family Psychiatric History: Reviewed family psychiatric history from progress note on 02/06/2021.  Family History:  Family  History  Problem Relation Age of Onset   Depression Mother    Alcohol abuse Mother    Cancer Mother    Hypertension Mother    Emphysema Mother    Stroke Mother    Glaucoma Father    Heart disease Father    Diabetes Brother    Drug abuse Son     Social History: Reviewed social history from progress note on 02/06/2021. Social History   Socioeconomic History   Marital status: Married    Spouse name: Not on file    Number of children: Not on file   Years of education: Not on file   Highest education level: Not on file  Occupational History   Not on file  Tobacco Use   Smoking status: Every Day    Packs/day: 1.00    Years: 35.00    Total pack years: 35.00    Types: Cigarettes    Passive exposure: Past   Smokeless tobacco: Never   Tobacco comments:    1ppd - 03/01/2021  Vaping Use   Vaping Use: Never used  Substance and Sexual Activity   Alcohol use: No   Drug use: No   Sexual activity: Not Currently  Other Topics Concern   Not on file  Social History Narrative   Not on file   Social Determinants of Health   Financial Resource Strain: Not on file  Food Insecurity: Not on file  Transportation Needs: Not on file  Physical Activity: Not on file  Stress: Not on file  Social Connections: Not on file    Allergies:  Allergies  Allergen Reactions   Pregabalin Other (See Comments)    unknown   Trazodone Other (See Comments)    Agitation   Bupropion Anxiety   Methocarbamol Other (See Comments)    Agitation and stiffness   Tramadol Other (See Comments)    Nausea And Vomiting Reports she feels "high"    Metabolic Disorder Labs: No results found for: "HGBA1C", "MPG" No results found for: "PROLACTIN" No results found for: "CHOL", "TRIG", "HDL", "CHOLHDL", "VLDL", "LDLCALC" Lab Results  Component Value Date   TSH 3.347 02/07/2021    Therapeutic Level Labs: No results found for: "LITHIUM" No results found for: "VALPROATE" No results found for: "CBMZ"  Current Medications: Current Outpatient Medications  Medication Sig Dispense Refill   amoxicillin-clavulanate (AUGMENTIN) 875-125 MG tablet Take 1 tablet by mouth every 12 (twelve) hours for 10 days. 20 tablet 0   atorvastatin (LIPITOR) 80 MG tablet Take 80 mg by mouth every evening.     DULoxetine (CYMBALTA) 30 MG capsule Take 1 capsule (30 mg total) by mouth daily. 90 capsule 0   [START ON 08/07/2021] eszopiclone (LUNESTA) 2 MG  TABS tablet TAKE 1 TABLET BY MOUTH AT BEDTIME AS NEEDED FOR SLEEP. TAKE IMMEDIATELY BEFORE BED 30 tablet 0   gabapentin (NEURONTIN) 300 MG capsule Take 1 capsule (300 mg total) by mouth 3 (three) times daily. 90 capsule 2   hydroxychloroquine (PLAQUENIL) 200 MG tablet Take 200 mg by mouth in the morning.     nicotine (NICODERM CQ - DOSED IN MG/24 HOURS) 14 mg/24hr patch Place 1 patch (14 mg total) onto the skin daily. 28 patch 0   VENTOLIN HFA 108 (90 Base) MCG/ACT inhaler INHALE 2 PUFFS INTO THE LUNGS EVERY 6 HOURS AS NEEDED FOR WHEEZING OR SHORTNESS OF BREATH 18 g 2   promethazine-dextromethorphan (PROMETHAZINE-DM) 6.25-15 MG/5ML syrup Take 5 mLs by mouth 4 (four) times daily as needed. (Patient not  taking: Reported on 07/19/2021) 118 mL 0   promethazine-phenylephrine (PROMETHAZINE VC) 6.25-5 MG/5ML SYRP Take 5 mLs by mouth every 8 (eight) hours as needed for congestion (and cough). (Patient not taking: Reported on 07/19/2021) 118 mL 0   No current facility-administered medications for this visit.   Facility-Administered Medications Ordered in Other Visits  Medication Dose Route Frequency Provider Last Rate Last Admin   albuterol (PROVENTIL) (2.5 MG/3ML) 0.083% nebulizer solution 2.5 mg  2.5 mg Nebulization Once Tyler Pita, MD         Musculoskeletal: Strength & Muscle Tone: within normal limits Gait & Station: normal Patient leans: N/A  Psychiatric Specialty Exam: Review of Systems  Musculoskeletal:  Positive for back pain (chronic).  Psychiatric/Behavioral: Negative.    All other systems reviewed and are negative.   Blood pressure 135/82, pulse 92, temperature 98.1 F (36.7 C), temperature source Temporal, weight 149 lb 9.6 oz (67.9 kg).Body mass index is 26.5 kg/m.  General Appearance: Casual  Eye Contact:  Fair  Speech:  Clear and Coherent  Volume:  Normal  Mood:  Euthymic  Affect:  Congruent  Thought Process:  Goal Directed and Descriptions of Associations: Intact   Orientation:  Full (Time, Place, and Person)  Thought Content: Logical   Suicidal Thoughts:  No  Homicidal Thoughts:  No  Memory:  Immediate;   Fair Recent;   Fair Remote;   Fair  Judgement:  Fair  Insight:  Fair  Psychomotor Activity:  Normal  Concentration:  Concentration: Fair and Attention Span: Fair  Recall:  [employer] of Knowledge: Fair  Language: Fair  Akathisia:  No  Handed:  Right  AIMS (if indicated): done  Assets:  Communication Skills Desire for Improvement Housing Social Support  ADL's:  Intact  Cognition: WNL  Sleep:  Fair   Screenings: [place] Office Visit from 06/22/2021 in [place] [facility] Total Score 0      [place] Visit from 07/19/2021 in [facility] Office Visit from 06/22/2021 in [facility] Counselor from 04/16/2021 in [facility] Visit from 02/06/2021 in [facility]  Total GAD-7 Score '1 16 19 19      '$ PHQ2-9    [place] Visit from 07/19/2021 in [place] Office Visit from 06/25/2021 in [place] Office Visit from 06/22/2021 in [place] Office Visit from 05/11/2021 in [place] Office Visit from 05/02/2021 in [facility]  PHQ-2 Total Score 0 0 0 2 0  PHQ-9 Total Score 0 -- 7 6 --      [place] Office Visit from 07/19/2021 in [place] ED from 07/12/2021 in [facility]  ED from 06/30/2021 in [facility] at [place] No Risk No Risk No Risk        Assessment and Plan: Tanya [name] is a 61 year old Caucasian female on disability, separated, lives in [place] with her daughter, has a  history of depression, anxiety, chronic pain, GERD, SLE, fibromyalgia, hyperlipidemia, chronic discoid lupus was evaluated in office today.  Patient is currently improving, will benefit from the following plan.  Plan GAD-improving Continue Cymbalta 30 mg p.o. daily.  Patient did not taper it off as discussed last visit. Discontinue venlafaxine extended release 37.5 mg-noncompliant  Discontinue BuSpar for side effects.  MDD in remission Continue CBT Patient continues to follow-up with Ms. Christina Hussami Continue medications as noted above  Primary insomnia-stable Lunesta 2 mg p.o. nightly  Tobacco use disorder-unstable Provided counseling for 5 minutes.  We will start nicotine replacement. Start NicoDerm patch 14 mg daily.  Follow-up in clinic in 3 months or sooner if needed.  This note was generated in part or whole with voice recognition software. Voice recognition is usually quite accurate but there are transcription errors that can and very often do occur. I apologize for any typographical errors that were not detected and corrected.    This note was generated in part or whole with voice recognition software. Voice recognition is usually quite accurate but there are transcription errors that can and very often do occur. I apologize for any typographical errors that were not detected and corrected.     Ursula Alert, MD 07/19/2021, 6:58 PM

## 2021-07-19 NOTE — Patient Instructions (Signed)
STAYQUITCOACH

## 2021-07-25 ENCOUNTER — Other Ambulatory Visit: Payer: Self-pay | Admitting: Psychiatry

## 2021-07-25 DIAGNOSIS — F3341 Major depressive disorder, recurrent, in partial remission: Secondary | ICD-10-CM

## 2021-07-25 DIAGNOSIS — F411 Generalized anxiety disorder: Secondary | ICD-10-CM

## 2021-07-29 NOTE — Progress Notes (Unsigned)
PROVIDER NOTE: Information contained herein reflects review and annotations entered in association with encounter. Interpretation of such information and data should be left to medically-trained personnel. Information provided to patient can be located elsewhere in the medical record under "Patient Instructions". Document created using STT-dictation technology, any transcriptional errors that may result from process are unintentional.    Patient: Tanya [name]  Service Category: E/M  Provider: Gaspar Cola, MD  DOB: [birth year]  DOS: 08/01/2021  Specialty: Interventional Pain Management  MRN: [id number]  Setting: Ambulatory outpatient  PCP: Leonel Ramsay, MD  Type: Established Patient    Referring Provider: Leonel Ramsay, MD  Location: Office  Delivery: Face-to-face     HPI  [name], a 61 y.o. year old female, is here today because of her No primary diagnosis found.. Ms. Aries primary complain today is No chief complaint on file. Last encounter: My last encounter with her was on 06/25/2021. Pertinent problems: Ms. Hackworth has Chronic upper back pain (2ry area of Pain) (Bilateral) (R>L); Chronic low back pain (3ry area of Pain) (Bilateral) (R>L) w/o sciatica; Chronic neck pain (1ry area of Pain) (Bilateral) (R>L); Fibromyalgia; Osteoarthritis; Chronic pain syndrome; Chronic upper extremity pain (Right); Chronic cervical radicular pain (Right); Chronic shoulder pain (Bilateral) (R>L); Chronic musculoskeletal pain; Cervical spondylosis; Cervical facet syndrome (Bilateral) (R>L); Shoulder radicular pain (Bilateral) (R>L); Acute postoperative pain; Spondylosis without myelopathy or radiculopathy, cervical region; Cervicalgia; Occipital headache (Left); Neurogenic pain; Cervico-occipital neuralgia (Left); Chronic elbow pain (Left); Osteoarthritis of AC (acromioclavicular) joint (Right); Chronic acromioclavicular joint pain (Right); Chronic shoulder pain (Right);  Epicondylitis elbow, medial (Left); 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 Pain Assessment: Severity of   is reported as a  /10. Location:    / . Onset:  . Quality:  . Timing:  . Modifying factor(s):  Marland Kitchen Vitals:  vitals were not taken for this visit.   Reason for encounter:  *** . ***  Pharmacotherapy Assessment  Analgesic: None MME/day: 0 mg/day.   Monitoring: [place] PMP: PDMP reviewed during this encounter.       Pharmacotherapy: No side-effects or adverse reactions reported. Compliance: No problems identified. Effectiveness: Clinically acceptable.  No notes on file  UDS:  Summary  Date Value Ref Range Status  01/31/2021 Note  Final    Comment:    ==================================================================== [employer] Select 13 (MW) ==================================================================== Test                             Result       Flag       Units  Drug Present and Declared for Prescription Verification   Oxazepam                       187          EXPECTED   ng/mg creat    Oxazepam may be administered as a scheduled prescription medication;    it is also an expected metabolite of other benzodiazepine drugs,    including diazepam, chlordiazepoxide, prazepam, clorazepate,    halazepam, and temazepam.    Oxycodone  972          EXPECTED   ng/mg creat   Oxymorphone                    197          EXPECTED   ng/mg creat   Noroxycodone                   500          EXPECTED   ng/mg creat    Sources of oxycodone include scheduled prescription medications.    Oxymorphone and  noroxycodone are expected metabolites of oxycodone.    Oxymorphone is also available as a scheduled prescription medication.  ==================================================================== Test                      Result    Flag   Units      Ref Range   Creatinine              39               mg/dL      >=20 ==================================================================== Declared Medications:  The flagging and interpretation on this report are based on the  following declared medications.  Unexpected results may arise from  inaccuracies in the declared medications.   **Note: The testing scope of this panel includes these medications:   Diazepam (Valium)  Oxycodone (Roxicodone)   **Note: The testing scope of this panel does not include the  following reported medications:   Albuterol (Ventolin HFA)  Atorvastatin (Lipitor)  Baclofen (Lioresal)  Buspirone (Buspar)  Dextromethorphan  Duloxetine (Cymbalta)  Gabapentin (Neurontin)  Promethazine ==================================================================== For clinical consultation, please call [phone]. ====================================================================      ROS  Constitutional: Denies any fever or chills Gastrointestinal: No reported hemesis, hematochezia, vomiting, or acute GI distress Musculoskeletal: Denies any acute onset joint swelling, redness, loss of ROM, or weakness Neurological: No reported episodes of acute onset apraxia, aphasia, dysarthria, agnosia, amnesia, paralysis, loss of coordination, or loss of consciousness  Medication Review  DULoxetine, albuterol, atorvastatin, eszopiclone, gabapentin, hydroxychloroquine, nicotine, promethazine-dextromethorphan, and promethazine-phenylephrine  History Review  Allergy: Ms. Gassner is allergic to pregabalin, trazodone, bupropion, methocarbamol, and tramadol. Drug: Ms. Cahn  reports no history of drug use. Alcohol:   reports no history of alcohol use. Tobacco:  reports that she has been smoking cigarettes. She has a 35.00 pack-year smoking history. She has been exposed to tobacco smoke. She has never used smokeless tobacco. Social: Ms. Sliva  reports that she has been smoking cigarettes. She has a 35.00 pack-year smoking history. She has been exposed to tobacco smoke. She has never used smokeless tobacco. She reports that she does not drink alcohol and does not use drugs. Medical:  has a past medical history of Anxiety, Bladder infection, Chronic lower back pain, Chronic neck pain, Collagen vascular disease ([place]), COVID-19, Depression, Diverticulitis, GERD (gastroesophageal reflux disease), Hyperlipidemia, Lupus ([place]), and Overactive bladder. Surgical: Ms. Cartelli  has a past surgical history that includes Abdominal hysterectomy; Colonoscopy; Colonoscopy with propofol (N/A, 07/03/2017); Tonsillectomy; Esophagogastroduodenoscopy (N/A, 10/21/2017); and Colonoscopy with propofol (N/A, 10/21/2017). Family: family history includes Alcohol abuse in her mother; Cancer in her mother; Depression in her mother; Diabetes in her brother; Drug abuse in her son; Emphysema in her mother; Glaucoma in her father; Heart disease in her father; Hypertension in her mother; Stroke in her mother.  Laboratory Chemistry Profile   Renal Lab Results  Component Value  Date   BUN 7 06/09/2019   CREATININE 0.72 06/09/2019   BCR 15 09/03/2017   GFRAA >60 06/09/2019   GFRNONAA >60 06/09/2019    Hepatic Lab Results  Component Value Date   AST 26 06/09/2019   ALT 20 06/09/2019   ALBUMIN 4.1 06/09/2019   ALKPHOS 29 (L) 06/09/2019   LIPASE 35 06/09/2019    Electrolytes Lab Results  Component Value Date   NA 135 06/09/2019   K 4.2 06/09/2019   CL 102 06/09/2019   CALCIUM 8.4 (L) 06/09/2019   MG 1.9 09/03/2017    Bone Lab Results  Component Value Date   25OHVITD1 25 (L) 09/03/2017   25OHVITD2 2.9 09/03/2017   25OHVITD3 22  09/03/2017    Inflammation (CRP: Acute Phase) (ESR: Chronic Phase) Lab Results  Component Value Date   CRP 2 09/03/2017   ESRSEDRATE 21 09/03/2017         Note: Above Lab results reviewed.  Recent Imaging Review  DG Chest 2 View CLINICAL DATA:  cough and chest congestion for over a week  EXAM: CHEST - 2 VIEW  COMPARISON:  Radiograph 03/14/2021, chest CT 06/07/2021  FINDINGS: Unchanged cardiomediastinal silhouette. Persistent left upper low pulmonary nodule as described on prior chest CT. There is no new airspace disease. There is no pleural effusion. No pneumothorax. No acute osseous abnormality.  IMPRESSION: No new airspace disease. Persistent left upper lobe pulmonary nodule as described on prior chest CT.  Electronically Signed   By: Maurine Simmering M.D.   On: 06/30/2021 11:23 Note: Reviewed        Physical Exam  General appearance: Well nourished, well developed, and well hydrated. In no apparent acute distress Mental status: Alert, oriented x 3 (person, place, & time)       Respiratory: No evidence of acute respiratory distress Eyes: PERLA Vitals: There were no vitals taken for this visit. BMI: Estimated body mass index is 26.5 kg/m as calculated from the following:   Height as of 07/12/21: 5' 3"  (1.6 m).   Weight as of 07/19/21: 149 lb 9.6 oz (67.9 kg). Ideal: Ideal body weight: 52.4 kg (115 lb 8.3 oz) Adjusted ideal body weight: 58.6 kg (129 lb 2.4 oz)  Assessment   Diagnosis Status  No diagnosis found. Controlled Controlled Controlled   Updated Problems: No problems updated.  Plan of Care  Problem-specific:  No problem-specific Assessment & Plan notes found for this encounter.  Ms. TEREN FRANCKOWIAK has a current medication list which includes the following long-term medication(s): duloxetine, [START ON 08/07/2021] eszopiclone, gabapentin, and ventolin hfa.  Pharmacotherapy (Medications Ordered): No orders of the defined types were placed in this  encounter.  Orders:  No orders of the defined types were placed in this encounter.  Follow-up plan:   No follow-ups on file.     Interventional Therapies  Risk  Complexity Considerations:   Estimated body mass index is 26.57 kg/m as calculated from the following:   Height as of this encounter: 5' 3"  (1.6 m).   Weight as of this encounter: 150 lb (68 kg). WNL   Planned  Pending:      Under consideration:      Completed:   Therapeutic left IA shoulder joint inj. x1 (12/30/2019) (100/100/100 x 5 days/0)  Palliative right IA shoulder joint inj. x2 (10/06/2018) (100/100/50/50)  Palliative right AC joint inj. x3 (04/02/2018) (N/A)  Palliative left medial epicondyle elbow inj. x1 (04/02/2018) (N/A)  Diagnostic left GONB x1 (12/25/2017) (N/A)  Palliative right  CESI x2 (02/12/2016) (N/A) Therapeutic left C7-T1 cervical ESI x1 (03/18/2016) (100/50/0/0)  Palliative bilateral cervical facet MBB x2 (06/26/2016)  Palliative right cervical facet RFA x3 (04/13/2020) (100/100/75/75)  Palliative left cervical facet RFA x3 (03/14/2020) (100/100/75/75)    Therapeutic  Palliative (PRN) options:   Palliative cervical facet MBB  Palliative cervical facet RFA       Recent Visits Date Type Provider Dept  06/25/21 Office Visit Milinda Pointer, [facility]  05/02/21 Office Visit Milinda Pointer, MD [facility]  Showing recent visits within past 90 days and meeting all other requirements Future Appointments Date Type Provider Dept  08/01/21 Appointment Milinda Pointer, MD [facility]  Showing future appointments within next 90 days and meeting all other requirements  I discussed the assessment and treatment plan with the patient. The patient was provided an opportunity to ask questions and all were answered. The patient agreed with the plan and demonstrated an understanding of the instructions.  Patient advised to call back or seek an in-person  evaluation if the symptoms or condition worsens.  Duration of encounter: *** minutes.  Total time on encounter, as per AMA guidelines included both the face-to-face and non-face-to-face time personally spent by the physician and/or other qualified health care professional(s) on the day of the encounter (includes time in activities that require the physician or other qualified health care professional and does not include time in activities normally performed by clinical staff). Physician's time may include the following activities when performed: preparing to see the patient (eg, review of tests, pre-charting review of records) obtaining and/or reviewing separately obtained history performing a medically appropriate examination and/or evaluation counseling and educating the patient/family/caregiver ordering medications, tests, or procedures referring and communicating with other health care professionals (when not separately reported) documenting clinical information in the electronic or other health record independently interpreting results (not separately reported) and communicating results to the patient/ family/caregiver care coordination (not separately reported)  Note by: Gaspar Cola, MD Date: 08/01/2021; Time: 3:58 PM

## 2021-08-01 ENCOUNTER — Ambulatory Visit (HOSPITAL_BASED_OUTPATIENT_CLINIC_OR_DEPARTMENT_OTHER): Payer: Medicaid Other | Admitting: Pain Medicine

## 2021-08-01 DIAGNOSIS — Z91199 Patient's noncompliance with other medical treatment and regimen due to unspecified reason: Secondary | ICD-10-CM

## 2021-08-01 NOTE — Patient Instructions (Signed)

## 2021-08-06 ENCOUNTER — Other Ambulatory Visit: Payer: Self-pay | Admitting: Psychiatry

## 2021-08-06 DIAGNOSIS — F5101 Primary insomnia: Secondary | ICD-10-CM

## 2021-08-13 DIAGNOSIS — R7303 Prediabetes: Secondary | ICD-10-CM | POA: Insufficient documentation

## 2021-10-02 ENCOUNTER — Ambulatory Visit (INDEPENDENT_AMBULATORY_CARE_PROVIDER_SITE_OTHER): Payer: Medicaid Other | Admitting: Licensed Clinical Social Worker

## 2021-10-02 DIAGNOSIS — F411 Generalized anxiety disorder: Secondary | ICD-10-CM | POA: Diagnosis not present

## 2021-10-02 DIAGNOSIS — F331 Major depressive disorder, recurrent, moderate: Secondary | ICD-10-CM

## 2021-10-02 NOTE — Progress Notes (Signed)
Virtual Visit via Video Note  I connected with Tanya [name] on 10/02/21 at  3:00 PM EDT by a video enabled telemedicine application and verified that I am speaking with the correct person using two identifiers.  Location: Patient: home Provider: remote office [place], [place])    I discussed the limitations of evaluation and management by telemedicine and the availability of in person appointments. The patient expressed understanding and agreed to proceed.  I discussed the assessment and treatment plan with the patient. The patient was provided an opportunity to ask questions and all were answered. The patient agreed with the plan and demonstrated an understanding of the instructions.   The patient was advised to call back or seek an in-person evaluation if the symptoms worsen or if the condition fails to improve as anticipated.  I provided 30 minutes of non-face-to-face time during this encounter.   Brinton Brandel R Domanique Huesman, LCSW   THERAPIST PROGRESS NOTE  Session Time: 3-330p  Participation Level: Active  Behavioral Response: NAAlertAnxious  Type of Therapy: Individual Therapy  Treatment Goals addressed: PProblem: Decrease depressive symptoms and improve levels of effective functioning-pt reports a decrease in overall depression symptoms 3 out of 5 sessions documented.  Goal: LTG: Reduce frequency, intensity, and duration of depression symptoms as evidenced by: per pt self report Outcome: Not Progressing Note: Pt reports an increase in depression symptoms Goal: STG: Mistee WILL PARTICIPATE IN AT LEAST 80% OF SCHEDULED INDIVIDUAL PSYCHOTHERAPY SESSIONS Outcome: Progressing Intervention: Encourage verbalization of feelings/concerns/expectations Note: Allowed pt to explore Intervention: Discuss self-management skills Note: Reviewed med compliance and encouraged pt to get sooner appt w/ psychiatrist Intervention: Encourage self-care activities Note: Reviewed    Problem:  Anxiety Disorder  Goal:  Reduce overall frequency, intensity, and duration of the anxiety so that daily functioning is not impaired per pt self report 3 out of 5 sessions documented.   Outcome: Not Progressing Note: Pt reports an increase in anxiety and stress symptoms  Goal: Enhance ability to effectively cope with the full variety of life's worries and anxiety per self report 3 out of 5 sessions documented  Outcome: Not Progressing Note: Pt reports she feels she is not coping well and feels overwhelmed Intervention: Assist with relaxation techniques, as appropriate (deep breathing exercises, meditation, guided imagery) Note: Reviewed  Intervention: Promote identification & development of sleep hygiene program Note: Reviewed    ProgressTowards Goals: Not Progressing  Interventions: Solution Focused and Supportive  Summary: Tanya [name] is a 61 y.o. female who presents with continuing symptoms related to depression and anxiety. Pt reports that Buspar was hurting stomach, so she stopped taking it. Pt reports cymbalta not really helping much with depression. Pt reports she is feeling more pain, worrying more, no energy, feeling down, feels stuck. Pt admits to some SI thoughts with no plans or intent to follow through. Pt states she's taking cymbalta and lunesta "and that's it".   Allowed pt to explore and express thoughts and feelings associated with recent life situations and external stressors. Pt states she she recently learned her grandson is going to jail and this has "tore her up". Pt states her other son just got out of jail, and this is also a trigger for her. Discussed need for pt to engage in self care behaviors so that she will be available to help her family members--something that is very important to her. Pt stated that she feels like she's always "on the go". Allowed pt to identify ways that she could slow down  and wind down at least a couple of times per day to help manage  anxiety/panic/stress.   Reviewed ways pt can get through this crisis taking it day by day and reaching out to her support people.   Continued recommendations are as follows: self care behaviors, positive social engagements, focusing on overall work/home/life balance, and focusing on positive physical and emotional wellness.   Suicidal/Homicidal: Yes--thoughts with no plans or intent to follow through   Therapist Response: Pt reports limits in resources and coping skills at time of session. Sent pt resources through MyChart portal. Treatment to continue as indicated.   Plan: Return again in 3 months. Follow up w/ psychiatrist ASAP. Reviewed local crisis resources if SI returns.   Encounter Diagnoses  Name Primary?   Moderate episode of recurrent major depressive disorder (HCC) Yes   GAD (generalized anxiety disorder)     Collaboration of Care: Other pt encouraged to continue care with psychiatrist of record, Dr. Ursula Alert  Patient/Guardian was advised Release of Information must be obtained prior to any record release in order to collaborate their care with an outside provider. Patient/Guardian was advised if they have not already done so to contact the registration department to sign all necessary forms in order for [place] to release information regarding their care.   Consent: Patient/Guardian gives verbal consent for treatment and assignment of benefits for services provided during this visit. Patient/Guardian expressed understanding and agreed to proceed.   [place], LCSW 10/02/2021

## 2021-10-03 ENCOUNTER — Encounter: Payer: Self-pay | Admitting: Psychiatry

## 2021-10-03 ENCOUNTER — Ambulatory Visit (INDEPENDENT_AMBULATORY_CARE_PROVIDER_SITE_OTHER): Payer: Medicaid Other | Admitting: Psychiatry

## 2021-10-03 VITALS — BP 135/84 | HR 81 | Ht 63.0 in | Wt 142.0 lb

## 2021-10-03 DIAGNOSIS — F17200 Nicotine dependence, unspecified, uncomplicated: Secondary | ICD-10-CM

## 2021-10-03 DIAGNOSIS — F331 Major depressive disorder, recurrent, moderate: Secondary | ICD-10-CM | POA: Diagnosis not present

## 2021-10-03 DIAGNOSIS — F411 Generalized anxiety disorder: Secondary | ICD-10-CM | POA: Diagnosis not present

## 2021-10-03 DIAGNOSIS — F5101 Primary insomnia: Secondary | ICD-10-CM | POA: Diagnosis not present

## 2021-10-03 MED ORDER — DULOXETINE HCL 30 MG PO CPEP: 30.0000 mg | ORAL_CAPSULE | Freq: Every day | ORAL | 0 refills | Status: DC

## 2021-10-03 MED ORDER — ARIPIPRAZOLE 2 MG PO TABS: 2.0000 mg | ORAL_TABLET | Freq: Every day | ORAL | 0 refills | Status: DC

## 2021-10-03 NOTE — Plan of Care (Signed)
  Problem: Decrease depressive symptoms and improve levels of effective functioning-pt reports a decrease in overall depression symptoms 3 out of 5 sessions documented.  Goal: LTG: Reduce frequency, intensity, and duration of depression symptoms as evidenced by: per pt self report Outcome: Not Progressing Note: Pt reports an increase in depression symptoms Goal: STG: Juli WILL PARTICIPATE IN AT LEAST 80% OF SCHEDULED INDIVIDUAL PSYCHOTHERAPY SESSIONS Outcome: Progressing Intervention: Encourage verbalization of feelings/concerns/expectations Note: Allowed pt to explore Intervention: Discuss self-management skills Note: Reviewed med compliance and encouraged pt to get sooner appt w/ psychiatrist Intervention: Encourage self-care activities Note: Reviewed    Problem: Anxiety Disorder  Goal:  Reduce overall frequency, intensity, and duration of the anxiety so that daily functioning is not impaired per pt self report 3 out of 5 sessions documented.   Outcome: Not Progressing Note: Pt reports an increase in anxiety and stress symptoms  Goal: Enhance ability to effectively cope with the full variety of life's worries and anxiety per self report 3 out of 5 sessions documented  Outcome: Not Progressing Note: Pt reports she feels she is not coping well and feels overwhelmed Intervention: Assist with relaxation techniques, as appropriate (deep breathing exercises, meditation, guided imagery) Note: Reviewed  Intervention: Promote identification & development of sleep hygiene program Note: Reviewed

## 2021-10-03 NOTE — Patient Instructions (Signed)
[facility] [phone] Address: [place], [place] [address]   Aripiprazole Tablets What is this medication? ARIPIPRAZOLE (ay ri PIP ray zole) treats schizophrenia, bipolar I disorder, autism spectrum disorder, and Tourette disorder. It may also be used with antidepressant medications to treat depression. It works by balancing the levels of dopamine and serotonin in the brain, hormones that help regulate mood, behaviors, and thoughts. It belongs to a group of medications called antipsychotics. Antipsychotics can be used to treat several kinds of mental health conditions. This medicine may be used for other purposes; ask your health care provider or pharmacist if you have questions. COMMON BRAND NAME(S): Abilify What should I tell my care team before I take this medication? They need to know if you have any of these conditions: Dementia Diabetes Difficulty swallowing Have trouble controlling your muscles Have urges you are unable to control (for example, gambling, spending money, or eating) Heart disease History of irregular heartbeat History of stroke Low blood counts, like low white cell, platelet, or red cell counts Low blood pressure Parkinson disease Seizures Suicidal thoughts, plans or attempt; a previous suicide attempt by you or a family member An unusual or allergic reaction to aripiprazole, other medications, foods, dyes, or preservatives Pregnant or trying to get pregnant Breast-feeding How should I use this medication? Take this medication by mouth with a glass of water. Follow the directions on the prescription label. You can take this medication with or without food. Take your doses at regular intervals. Do not take your medication more often than directed. Do not stop taking except on the advice of your care team. A special MedGuide will be given to you by the pharmacist with each prescription and refill. Be sure to read this  information carefully each time. Talk to your care team regarding the use of this medication in children. While this medication may be prescribed for children as young as 39 years of age for selected conditions, precautions do apply. Overdosage: If you think you have taken too much of this medicine contact a poison control center or emergency room at once. NOTE: This medicine is only for you. Do not share this medicine with others. What if I miss a dose? If you miss a dose, take it as soon as you can. If it is almost time for your next dose, take only that dose. Do not take double or extra doses. What may interact with this medication? Do not take this medication with any of the following: Brexpiprazole Cisapride Dextromethorphan; quinidine Dronedarone Metoclopramide Pimozide Quinidine Thioridazine This medication may also interact with the following: Antihistamines for allergy, cough, and cold Carbamazepine Certain medications for anxiety or sleep Certain medications for depression like amitriptyline, fluoxetine, paroxetine, sertraline Certain medications for fungal infections like fluconazole, itraconazole, ketoconazole, posaconazole, voriconazole Clarithromycin General anesthetics like halothane, isoflurane, methoxyflurane, propofol Levodopa or other medications for Parkinson's disease Medications for blood pressure Medications for seizures Medications that relax muscles for surgery Narcotic medications for pain Other medications that prolong the QT interval (cause an abnormal heart rhythm) Phenothiazines like chlorpromazine, prochlorperazine Rifampin This list may not describe all possible interactions. Give your health care provider a list of all the medicines, herbs, non-prescription drugs, or dietary supplements you use. Also tell them if you smoke, drink alcohol, or use illegal drugs. Some items may interact with your medicine. What should I watch for while using this  medication? Visit your care team for regular checks on your progress. Tell your care  team if symptoms do not start to get better or if they get worse. Do not stop taking except on your care team's advice. You may develop a severe reaction. Your care team will tell you how much medication to take. Patients and their families should watch out for new or worsening depression or thoughts of suicide. Also watch out for sudden changes in feelings such as feeling anxious, agitated, panicky, irritable, hostile, aggressive, impulsive, severely restless, overly excited and hyperactive, or not being able to sleep. If this happens, especially at the beginning of antidepressant treatment or after a change in dose, call your care team. You may get dizzy or drowsy. Do not drive, use machinery, or do anything that needs mental alertness until you know how this medication affects you. Do not stand or sit up quickly, especially if you are an older patient. This reduces the risk of dizzy or fainting spells. Alcohol may interfere with the effect of this medication. Avoid alcoholic drinks. This medication can cause problems with controlling your body temperature. It can lower the response of your body to cold temperatures. If possible, stay indoors during cold weather. If you must go outdoors, wear warm clothes. It can also lower the response of your body to heat. Do not overheat. Do not over-exercise. Stay out of the sun when possible. If you must be in the sun, wear cool clothing. Drink plenty of water. If you have trouble controlling your body temperature, call your care team right away. This medication may cause dry eyes and blurred vision. If you wear contact lenses, you may feel some discomfort. Lubricating drops may help. See your eye care specialist if the problem does not go away or is severe. This medication may increase blood sugar. Ask your care team if changes in diet or medications are needed if you have  diabetes. There have been reports of increased sexual urges or other strong urges such as gambling while taking this medication. If you experience any of these while taking this medication, you should report this to your care team as soon as possible. What side effects may I notice from receiving this medication? Side effects that you should report to your care team as soon as possible: Allergic reactions--skin rash, itching, hives, swelling of the face, lips, tongue, or throat High blood sugar (hyperglycemia)--increased thirst or amount of urine, unusual weakness or fatigue, blurry vision High fever, stiff muscles, increased sweating, fast or irregular heartbeat, and confusion, which may be signs of neuroleptic malignant syndrome Low blood pressure--dizziness, feeling faint or lightheaded, blurry vision Pain or trouble swallowing Prolonged or painful erection Seizures Stroke--sudden numbness or weakness of the face, arm, or leg, trouble speaking, confusion, trouble walking, loss of balance or coordination, dizziness, severe headache, change in vision Uncontrolled and repetitive body movements, muscle stiffness or spasms, tremors or shaking, loss of balance or coordination, restlessness, shuffling walk, which may be signs of extrapyramidal symptoms (EPS) Thoughts of suicide or self-harm, worsening mood, feelings of depression Urges to engage in impulsive behaviors such as gambling, binge eating, sexual activity, or shopping in ways that are unusual for you Side effects that usually do not require medical attention (report these to your care team if they continue or are bothersome): Constipation Drowsiness Weight gain This list may not describe all possible side effects. Call your doctor for medical advice about side effects. You may report side effects to FDA at 1-800-FDA-1088. Where should I keep my medication? Keep out of the reach of children and  pets. Store at room temperature between 15  and 30 degrees C (59 and 86 degrees F). Throw away any unused medication after the expiration date. NOTE: This sheet is a summary. It may not cover all possible information. If you have questions about this medicine, talk to your doctor, pharmacist, or health care provider.  2023 [employer]vier/Gold Standard (2020-02-10 00:00:00)

## 2021-10-03 NOTE — Progress Notes (Signed)
[facility] MD OP Progress Note  10/03/2021 6:12 PM Tanya [name]  MRN:  [id number]  Chief Complaint:  Chief Complaint  Patient presents with   Follow-up: 61 year old Caucasian female with history of depression, anxiety presented with worsening depression for medication management.   HPI: Tanya [name] is a 61 year old Caucasian female on SSD, separated from husband, lives in [place], has a history of GAD, MDD, insomnia, chronic pain, fibromyalgia, osteoarthritis, SLE was evaluated in office today.  Patient was at her therapist's session yesterday and since she was not doing well was advised to follow up with our clinic for a sooner appointment for further medication management.  Patient today reports she has been having multiple psychosocial stressors.  Her son who was previously incarcerated got into legal trouble again.  Patient reports she had to help him out by getting bail bond for him.  Patient also reports  34 year old grandson also recently got into legal trouble.  Patient also having relationship struggles with her daughter.  She is in constant pain and that also worries her and limits her ability to function.  She hence reports she has a lot going on.  Does not have a lot of social support.  Recently had thoughts about not wanting to be here although she reports that she will never do anything to hurt herself.  Patient reports sleep is okay when she takes her [place].  She is motivated to stay in psychotherapy.  Denies any suicidality, homicidality or perceptual disturbances.  Compliant on the Cymbalta, denies side effects.  She is no longer taking BuSpar, reports she had GI problems on the BuSpar.  Denies any other concerns today.  Visit Diagnosis:    ICD-10-CM   1. GAD (generalized anxiety disorder)  F41.1 DULoxetine (CYMBALTA) 30 MG capsule    2. Moderate episode of recurrent major depressive disorder (HCC)  F33.1 ARIPiprazole (ABILIFY) 2 MG tablet    3. Primary insomnia   F51.01     4. Tobacco use disorder  F17.200       Past Psychiatric History: Reviewed past psychiatric history from progress note on 02/06/2021.  Past trials of Cymbalta-higher dosage did not tolerate, trazodone-had side effects, Wellbutrin-side effects, Paxil-side effects, Prozac-side effects, Valium, doxepin, Trintellix, mirtazapine.  Past Medical History:  Past Medical History:  Diagnosis Date   Anxiety    Bladder infection    8/18   Chronic lower back pain    Chronic neck pain    Collagen vascular disease ([place])    COVID-19    Depression    Diverticulitis    GERD (gastroesophageal reflux disease)    Hyperlipidemia    Lupus (HCC)    Overactive bladder     Past Surgical History:  Procedure Laterality Date   ABDOMINAL HYSTERECTOMY     COLONOSCOPY     COLONOSCOPY WITH PROPOFOL N/A 07/03/2017   Procedure: COLONOSCOPY WITH PROPOFOL;  Surgeon: [name], MD;  Location: [facility] ENDOSCOPY;  Service: Endoscopy;  Laterality: N/A;   COLONOSCOPY WITH PROPOFOL N/A 10/21/2017   Procedure: COLONOSCOPY WITH PROPOFOL;  Surgeon: [name], MD;  Location: [facility] ENDOSCOPY;  Service: Endoscopy;  Laterality: N/A;   ESOPHAGOGASTRODUODENOSCOPY N/A 10/21/2017   Procedure: ESOPHAGOGASTRODUODENOSCOPY (EGD);  Surgeon: [name], MD;  Location: [facility] ENDOSCOPY;  Service: Endoscopy;  Laterality: N/A;   TONSILLECTOMY      Family Psychiatric History: Reviewed family psychiatric history from progress note on 02/06/2021.  Family History:  Family History  Problem Relation Age of Onset   Depression  Mother    Alcohol abuse Mother    Cancer Mother    Hypertension Mother    Emphysema Mother    Stroke Mother    Glaucoma Father    Heart disease Father    Diabetes Brother    Drug abuse Son     Social History: Reviewed social history from progress note on 02/06/2021. Social History   Socioeconomic History   Marital status: Married    Spouse name: Not on file   Number of children:  Not on file   Years of education: Not on file   Highest education level: Not on file  Occupational History   Not on file  Tobacco Use   Smoking status: Every Day    Packs/day: 1.00    Years: 35.00    Total pack years: 35.00    Types: Cigarettes    Passive exposure: Past   Smokeless tobacco: Never   Tobacco comments:    1ppd - 03/01/2021  Vaping Use   Vaping Use: Never used  Substance and Sexual Activity   Alcohol use: No   Drug use: No   Sexual activity: Not Currently  Other Topics Concern   Not on file  Social History Narrative   Not on file   Social Determinants of Health   Financial Resource Strain: Not on file  Food Insecurity: Not on file  Transportation Needs: Not on file  Physical Activity: Not on file  Stress: Not on file  Social Connections: Not on file    Allergies:  Allergies  Allergen Reactions   Pregabalin Other (See Comments)    unknown   Trazodone Other (See Comments)    Agitation   Bupropion Anxiety   Methocarbamol Other (See Comments)    Agitation and stiffness   Tramadol Other (See Comments)    Nausea And Vomiting Reports she feels "high"    Metabolic Disorder Labs: No results found for: "HGBA1C", "MPG" No results found for: "PROLACTIN" No results found for: "CHOL", "TRIG", "HDL", "CHOLHDL", "VLDL", "LDLCALC" Lab Results  Component Value Date   TSH 3.347 02/07/2021    Therapeutic Level Labs: No results found for: "LITHIUM" No results found for: "VALPROATE" No results found for: "CBMZ"  Current Medications: Current Outpatient Medications  Medication Sig Dispense Refill   ARIPiprazole (ABILIFY) 2 MG tablet Take 1 tablet (2 mg total) by mouth daily with breakfast. 30 tablet 0   atorvastatin (LIPITOR) 80 MG tablet Take 80 mg by mouth every evening.     eszopiclone (LUNESTA) 2 MG TABS tablet TAKE 1 TABLET BY MOUTH AT BEDTIME AS NEEDED FOR SLEEP. TAKE IMMEDIATELY BEFORE BED 30 tablet 1   gabapentin (NEURONTIN) 300 MG capsule Take 1  capsule (300 mg total) by mouth 3 (three) times daily. 90 capsule 2   hydroxychloroquine (PLAQUENIL) 200 MG tablet Take 200 mg by mouth in the morning.     nicotine (NICODERM CQ - DOSED IN MG/24 HOURS) 14 mg/24hr patch Place 1 patch (14 mg total) onto the skin daily. 28 patch 0   VENTOLIN HFA 108 (90 Base) MCG/ACT inhaler INHALE 2 PUFFS INTO THE LUNGS EVERY 6 HOURS AS NEEDED FOR WHEEZING OR SHORTNESS OF BREATH 18 g 2   Baclofen 5 MG TABS Take by mouth. (Patient not taking: Reported on 10/03/2021)     DULoxetine (CYMBALTA) 30 MG capsule Take 1 capsule (30 mg total) by mouth daily. 90 capsule 0   No current facility-administered medications for this visit.   Facility-Administered Medications Ordered in Other Visits  Medication Dose Route Frequency Provider Last Rate Last Admin   albuterol (PROVENTIL) (2.5 MG/3ML) 0.083% nebulizer solution 2.5 mg  2.5 mg Nebulization Once Tyler Pita, MD         Musculoskeletal: Strength & Muscle Tone: within normal limits Gait & Station: normal Patient leans: N/A  Psychiatric Specialty Exam: Review of Systems  Musculoskeletal:  Positive for arthralgias, back pain and neck pain.  Psychiatric/Behavioral:  Positive for decreased concentration and dysphoric mood. The patient is nervous/anxious.   All other systems reviewed and are negative.   Blood pressure 135/84, pulse 81, height '5\' 3"'$  (1.6 m), weight 142 lb (64.4 kg).Body mass index is 25.15 kg/m.  General Appearance: Casual  Eye Contact:  Fair  Speech:  Clear and Coherent  Volume:  Normal  Mood:  Anxious and Depressed  Affect:  Tearful  Thought Process:  Goal Directed and Descriptions of Associations: Intact  Orientation:  Full (Time, Place, and Person)  Thought Content: Logical   Suicidal Thoughts:  No  Homicidal Thoughts:  No  Memory:  Immediate;   Fair Recent;   Fair Remote;   Fair  Judgement:  Fair  Insight:  Fair  Psychomotor Activity:  Normal  Concentration:  Concentration: Fair  and Attention Span: Fair  Recall:  [employer] of Knowledge: Fair  Language: Fair  Akathisia:  No  Handed:  Right  AIMS (if indicated): not done  Assets:  Communication Skills Desire for Improvement Housing Social Support  ADL's:  Intact  Cognition: WNL  Sleep:  Fair   Screenings: [place] Office Visit from 06/22/2021 in [facility] Total Score 0      [place] Office Visit from 10/03/2021 in [place] Counselor from 10/02/2021 in [place] Office Visit from 07/19/2021 in [place] Office Visit from 06/22/2021 in [facility] Counselor from 04/16/2021 in [facility]  Total GAD-7 Score '17 20 1 16 19      '$ PHQ2-9    [place] Visit from 10/03/2021 in [facility] Counselor from 10/02/2021 in [place] Office Visit from 07/19/2021 in [place] Office Visit from 06/25/2021 in [facility] Office Visit from 06/22/2021 in [facility]  PHQ-2 Total Score 6 6 0 0 0  PHQ-9 Total Score 18 21 0 -- 7      [place] Office Visit from 10/03/2021 in [facility] Counselor from 10/02/2021 in [place] Office Visit from 07/19/2021 in [facility] Error: Q3, 4, or 5 should not be populated when Q2 is No No Risk        Assessment and Plan: OREAN GIARRATANO is a 61 year old Caucasian female on disability, separated, lives in [place], has a history of depression, anxiety, chronic pain, GERD, SLE, fibromyalgia, chronic discoid lupus was evaluated in office today.  Patient is currently struggling  with depression, anxiety, will benefit from the following plan.  Plan GAD-unstable Cymbalta 30 mg p.o. daily Continue CBT.  MDD-unstable Start Abilify 2 mg p.o. daily in the morning Cymbalta 30 mg p.o. daily  Primary insomnia-stable Lunesta 2 mg p.o. nightly  Tobacco use disorder-unstable Will monitor closely  Will repeat EKG at next visit. EKG reviewed - 02/07/2021 - qtc - 442.   Follow-up  in clinic in 2 weeks or sooner if needed.  Crisis plan discussed with patient.  Patient to call 52 or go to the nearest urgent care or emergency department if in a crisis.   This note was generated in part or whole with voice recognition software. Voice recognition is usually quite accurate but there are transcription errors that can and very often do occur. I apologize for any typographical errors that were not detected and corrected.      Ursula Alert, MD 10/04/2021, 6:01 PM

## 2021-10-04 ENCOUNTER — Telehealth: Payer: Self-pay

## 2021-10-04 NOTE — Progress Notes (Unsigned)
PROVIDER NOTE: Information contained herein reflects review and annotations entered in association with encounter. Interpretation of such information and data should be left to medically-trained personnel. Information provided to patient can be located elsewhere in the medical record under "Patient Instructions". Document created using STT-dictation technology, any transcriptional errors that may result from process are unintentional.    Patient: Tanya [name]  Service Category: E/M  Provider: Gaspar Cola, MD  DOB: 02-14-1960  DOS: 10/08/2021  Referring Provider: Leonel Ramsay, MD  MRN: [id number]  Specialty: Interventional Pain Management  PCP: Leonel Ramsay, MD  Type: Established Patient  Setting: Ambulatory outpatient    Location: Office  Delivery: Face-to-face     HPI  [name], a 61 y.o. year old female, is here today because of her Chronic pain syndrome [G89.4]. [name] primary complain today is No chief complaint on file. Last encounter: My last encounter with her was on 08/01/2021. Pertinent problems: [name] has Chronic upper back pain (2ry area of Pain) (Bilateral) (R>L); Chronic low back pain (3ry area of Pain) (Bilateral) (R>L) w/o sciatica; Chronic neck pain (1ry area of Pain) (Bilateral) (R>L); Fibromyalgia; Osteoarthritis; Chronic pain syndrome; Chronic upper extremity pain (Right); Chronic cervical radicular pain (Right); Chronic shoulder pain (Bilateral) (R>L); Chronic musculoskeletal pain; Cervical spondylosis; Cervical facet syndrome (Bilateral) (R>L); Shoulder radicular pain (Bilateral) (R>L); Acute postoperative pain; Spondylosis without myelopathy or radiculopathy, cervical region; Cervicalgia; Occipital headache (Left); Neurogenic pain; Cervico-occipital neuralgia (Left); Chronic elbow pain (Left); Osteoarthritis of AC (acromioclavicular) joint (Right); Chronic acromioclavicular joint pain (Right); Chronic shoulder pain (Right);  Epicondylitis elbow, medial (Left); 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 Pain Assessment: Severity of   is reported as a  /10. Location:    / . Onset:  . Quality:  . Timing:  . Modifying factor(s):  Marland Kitchen Vitals:  vitals were not taken for this visit.   Reason for encounter: follow-up evaluation.  On 04/13/2020 the patient had a right-sided cervical facet RFA #3.  Previously she was taking oxycodone IR 5 mg p.o. twice daily.  On 06/25/2021 she came to the clinics to let us know how well she was doing after the radiofrequency ablation and that she had decided to completely taper herself off of the opioids.  According to the patient she slowly tapered the opioids down and what ever was remaining she threw it away.  She was very proud of herself and indicated that she was feeling better than ever, while being off of the opioids.  Interestingly, upon checking the PMP I see that on 07/06/2021, just 11 days after that encounter, she went ahead and had her third prescription for oxycodone filled.  This was prescription 3/3 given to her on 05/02/2021.  If she was taking that prescription twice daily, may should have lasted until 08/05/2021.  She actually had an appointment to see [place] on 08/01/2021 which she did not keep.  ***  RTCB: 11/03/2021 Nonopioids transfer 12/22/2019: Baclofen and Neurontin  Pharmacotherapy Assessment  Analgesic: None MME/day: 0 mg/day.   Monitoring: [place] PMP: PDMP reviewed during this encounter.       Pharmacotherapy: No side-effects or adverse reactions reported. Compliance: No problems identified. Effectiveness:  Clinically acceptable.  No notes on file  No results found for: "CBDTHCR" No results found for: "D8THCCBX" No results found for: "D9THCCBX"  UDS:  Summary  Date Value Ref Range Status  01/31/2021 Note  Final    Comment:    ==================================================================== [employer] [facility] [employer] (MW) ==================================================================== Test                             Result       Flag       Units  Drug Present and Declared for Prescription Verification   Oxazepam                       187          EXPECTED   ng/mg creat    Oxazepam may be administered as a scheduled prescription medication;    it is also an expected metabolite of other benzodiazepine drugs,    including diazepam, chlordiazepoxide, prazepam, clorazepate,    halazepam, and temazepam.    Oxycodone                      972          EXPECTED   ng/mg creat   Oxymorphone                    197          EXPECTED   ng/mg creat   Noroxycodone                   500          EXPECTED   ng/mg creat    Sources of oxycodone include scheduled prescription medications.    Oxymorphone and noroxycodone are expected metabolites of oxycodone.    Oxymorphone is also available as a scheduled prescription medication.  ==================================================================== Test                      Result    Flag   Units      Ref Range   Creatinine              39               mg/dL      >=20 ==================================================================== Declared Medications:  The flagging and interpretation on this report are based on the  following declared medications.  Unexpected results may arise from  inaccuracies in the declared medications.   **Note: The testing scope of this panel includes these medications:   Diazepam (Valium)  Oxycodone (Roxicodone)   **Note: The testing scope of this panel does not include the  following reported medications:    Albuterol (Ventolin HFA)  Atorvastatin (Lipitor)  Baclofen (Lioresal)  Buspirone (Buspar)  Dextromethorphan  Duloxetine (Cymbalta)  Gabapentin (Neurontin)  Promethazine ==================================================================== For clinical consultation, please call [phone]. ====================================================================       ROS  Constitutional: Denies any fever or chills Gastrointestinal: No reported hemesis, hematochezia, vomiting, or acute GI distress Musculoskeletal: Denies any acute onset joint swelling, redness, loss of ROM, or weakness Neurological: No reported episodes of acute onset apraxia, aphasia, dysarthria, agnosia, amnesia, paralysis, loss of coordination, or loss of consciousness  Medication Review  ARIPiprazole, Baclofen, DULoxetine, albuterol, atorvastatin, eszopiclone, gabapentin, hydroxychloroquine, and nicotine  History Review  Allergy: Ms. Swenor is allergic to pregabalin, trazodone, bupropion, methocarbamol, and tramadol. Drug: Ms. Gammon  reports no history of drug use. Alcohol:  reports no history  of alcohol use. Tobacco:  reports that she has been smoking cigarettes. She has a 35.00 pack-year smoking history. She has been exposed to tobacco smoke. She has never used smokeless tobacco. Social: Ms. Maita  reports that she has been smoking cigarettes. She has a 35.00 pack-year smoking history. She has been exposed to tobacco smoke. She has never used smokeless tobacco. She reports that she does not drink alcohol and does not use drugs. Medical:  has a past medical history of Anxiety, Bladder infection, Chronic lower back pain, Chronic neck pain, Collagen vascular disease ([place]), COVID-19, Depression, Diverticulitis, GERD (gastroesophageal reflux disease), Hyperlipidemia, Lupus ([place]), and Overactive bladder. Surgical: Ms. Fischel  has a past surgical history that includes Abdominal hysterectomy; Colonoscopy;  Colonoscopy with propofol (N/A, 07/03/2017); Tonsillectomy; Esophagogastroduodenoscopy (N/A, 10/21/2017); and Colonoscopy with propofol (N/A, 10/21/2017). Family: family history includes Alcohol abuse in her mother; Cancer in her mother; Depression in her mother; Diabetes in her brother; Drug abuse in her son; Emphysema in her mother; Glaucoma in her father; Heart disease in her father; Hypertension in her mother; Stroke in her mother.  Laboratory Chemistry Profile   Renal Lab Results  Component Value Date   BUN 7 06/09/2019   CREATININE 0.72 06/09/2019   BCR 15 09/03/2017   GFRAA >60 06/09/2019   GFRNONAA >60 06/09/2019    Hepatic Lab Results  Component Value Date   AST 26 06/09/2019   ALT 20 06/09/2019   ALBUMIN 4.1 06/09/2019   ALKPHOS 29 (L) 06/09/2019   LIPASE 35 06/09/2019    Electrolytes Lab Results  Component Value Date   NA 135 06/09/2019   K 4.2 06/09/2019   CL 102 06/09/2019   CALCIUM 8.4 (L) 06/09/2019   MG 1.9 09/03/2017    Bone Lab Results  Component Value Date   25OHVITD1 25 (L) 09/03/2017   25OHVITD2 2.9 09/03/2017   25OHVITD3 22 09/03/2017    Inflammation (CRP: Acute Phase) (ESR: Chronic Phase) Lab Results  Component Value Date   CRP 2 09/03/2017   ESRSEDRATE 21 09/03/2017         Note: Above Lab results reviewed.  Recent Imaging Review  DG Chest 2 View CLINICAL DATA:  cough and chest congestion for over a week  EXAM: CHEST - 2 VIEW  COMPARISON:  Radiograph 03/14/2021, chest CT 06/07/2021  FINDINGS: Unchanged cardiomediastinal silhouette. Persistent left upper low pulmonary nodule as described on prior chest CT. There is no new airspace disease. There is no pleural effusion. No pneumothorax. No acute osseous abnormality.  IMPRESSION: No new airspace disease. Persistent left upper lobe pulmonary nodule as described on prior chest CT.  Electronically Signed   By: Maurine Simmering M.D.   On: 06/30/2021 11:23 Note: Reviewed        Physical  Exam  General appearance: Well nourished, well developed, and well hydrated. In no apparent acute distress Mental status: Alert, oriented x 3 (person, place, & time)       Respiratory: No evidence of acute respiratory distress Eyes: PERLA Vitals: There were no vitals taken for this visit. BMI: Estimated body mass index is 25.15 kg/m as calculated from the following:   Height as of 10/03/21: 5' 3"  (1.6 m).   Weight as of 10/03/21: 142 lb (64.4 kg). Ideal: Ideal body weight: 52.4 kg (115 lb 8.3 oz) Adjusted ideal body weight: 57.2 kg (126 lb 1.8 oz)  Assessment   Diagnosis Status  1. Chronic pain syndrome   2. Chronic neck pain (1ry area of Pain) (Bilateral) (  R>L)   3. Chronic upper back pain (2ry area of Pain) (Bilateral) (R>L)   4. Chronic low back pain (3ry area of Pain) (Bilateral) (R>L) w/o sciatica   5. Cervical facet syndrome (Bilateral) (R>L)   6. Cervical spine pain   7. Cervicalgia   8. Chronic shoulder pain (Bilateral) (R>L)   9. Chronic musculoskeletal pain   10. Pharmacologic therapy   11. Chronic use of opiate for therapeutic purpose   12. Encounter for medication management   13. Encounter for chronic pain management    Controlled Controlled Controlled   Updated Problems: No problems updated.  Plan of Care  Problem-specific:  No problem-specific Assessment & Plan notes found for this encounter.  Ms. AILANI GOVERNALE has a current medication list which includes the following long-term medication(s): aripiprazole, duloxetine, eszopiclone, gabapentin, and ventolin hfa.  Pharmacotherapy (Medications Ordered): No orders of the defined types were placed in this encounter.  Orders:  No orders of the defined types were placed in this encounter.  Follow-up plan:   No follow-ups on file.     Interventional Therapies  Risk  Complexity Considerations:   Estimated body mass index is 26.57 kg/m as calculated from the following:   Height as of this encounter: 5'  3" (1.6 m).   Weight as of this encounter: 150 lb (68 kg). WNL   Planned  Pending:      Under consideration:      Completed:   Therapeutic left IA shoulder joint inj. x1 (12/30/2019) (100/100/100 x 5 days/0)  Palliative right IA shoulder joint inj. x2 (10/06/2018) (100/100/50/50)  Palliative right AC joint inj. x3 (04/02/2018) (N/A)  Palliative left medial epicondyle elbow inj. x1 (04/02/2018) (N/A)  Diagnostic left GONB x1 (12/25/2017) (N/A)  Palliative right CESI x2 (02/12/2016) (N/A) Therapeutic left C7-T1 cervical ESI x1 (03/18/2016) (100/50/0/0)  Palliative bilateral cervical facet MBB x2 (06/26/2016)  Palliative right cervical facet RFA x3 (04/13/2020) (100/100/75/75)  Palliative left cervical facet RFA x3 (03/14/2020) (100/100/75/75)    Therapeutic  Palliative (PRN) options:   Palliative cervical facet MBB  Palliative cervical facet RFA       Recent Visits No visits were found meeting these conditions. Showing recent visits within past 90 days and meeting all other requirements Future Appointments Date Type Provider Dept  10/08/21 Appointment Milinda Pointer, MD [facility]  Showing future appointments within next 90 days and meeting all other requirements  I discussed the assessment and treatment plan with the patient. The patient was provided an opportunity to ask questions and all were answered. The patient agreed with the plan and demonstrated an understanding of the instructions.  Patient advised to call back or seek an in-person evaluation if the symptoms or condition worsens.  Duration of encounter: *** minutes.  Total time on encounter, as per AMA guidelines included both the face-to-face and non-face-to-face time personally spent by the physician and/or other qualified health care professional(s) on the day of the encounter (includes time in activities that require the physician or other qualified health care professional and does not include time  in activities normally performed by clinical staff). Physician's time may include the following activities when performed: preparing to see the patient (eg, review of tests, pre-charting review of records) obtaining and/or reviewing separately obtained history performing a medically appropriate examination and/or evaluation counseling and educating the patient/family/caregiver ordering medications, tests, or procedures referring and communicating with other health care professionals (when not separately reported) documenting clinical information in the electronic or other health  record independently interpreting results (not separately reported) and communicating results to the patient/ family/caregiver care coordination (not separately reported)  Note by: Gaspar Cola, MD Date: 10/08/2021; Time: 1:34 PM

## 2021-10-04 NOTE — Telephone Encounter (Signed)
Medication management - Telephone call with [facility] to inform patient was approved for Abilify 2 mg tablets, one daily with breakfast, #30 for 30 days.  Completed prior authorization online with CoverMyMeds and approved by [employer] [place] Medicaid.  Pharmacist verified the medication was able to be filled now with no issues.

## 2021-10-08 ENCOUNTER — Encounter: Payer: Self-pay | Admitting: Pain Medicine

## 2021-10-08 ENCOUNTER — Ambulatory Visit: Payer: Medicaid Other | Attending: Pain Medicine | Admitting: Pain Medicine

## 2021-10-08 VITALS — BP 136/93 | HR 88 | Temp 97.3°F | Ht 63.0 in | Wt 142.0 lb

## 2021-10-08 DIAGNOSIS — M25512 Pain in left shoulder: Secondary | ICD-10-CM | POA: Diagnosis present

## 2021-10-08 DIAGNOSIS — M719 Bursopathy, unspecified: Secondary | ICD-10-CM | POA: Diagnosis present

## 2021-10-08 DIAGNOSIS — M545 Low back pain, unspecified: Secondary | ICD-10-CM | POA: Diagnosis present

## 2021-10-08 DIAGNOSIS — G894 Chronic pain syndrome: Secondary | ICD-10-CM | POA: Insufficient documentation

## 2021-10-08 DIAGNOSIS — Z79899 Other long term (current) drug therapy: Secondary | ICD-10-CM | POA: Insufficient documentation

## 2021-10-08 DIAGNOSIS — G8929 Other chronic pain: Secondary | ICD-10-CM | POA: Insufficient documentation

## 2021-10-08 DIAGNOSIS — M542 Cervicalgia: Secondary | ICD-10-CM | POA: Diagnosis present

## 2021-10-08 DIAGNOSIS — M25511 Pain in right shoulder: Secondary | ICD-10-CM | POA: Diagnosis present

## 2021-10-08 DIAGNOSIS — Z79891 Long term (current) use of opiate analgesic: Secondary | ICD-10-CM | POA: Insufficient documentation

## 2021-10-08 DIAGNOSIS — M7918 Myalgia, other site: Secondary | ICD-10-CM | POA: Diagnosis present

## 2021-10-08 DIAGNOSIS — M47812 Spondylosis without myelopathy or radiculopathy, cervical region: Secondary | ICD-10-CM | POA: Diagnosis present

## 2021-10-08 DIAGNOSIS — M549 Dorsalgia, unspecified: Secondary | ICD-10-CM | POA: Diagnosis present

## 2021-10-08 MED ORDER — OXYCODONE HCL 5 MG PO TABS: 5.0000 mg | ORAL_TABLET | Freq: Two times a day (BID) | ORAL | 0 refills | Status: DC | PRN

## 2021-10-08 MED ORDER — PENTAFLUOROPROP-TETRAFLUOROETH EX AERO
INHALATION_SPRAY | Freq: Once | CUTANEOUS | Status: AC
  Administered 2021-10-08: 30 via TOPICAL
  Filled 2021-10-08: qty 116

## 2021-10-08 MED ORDER — TRIAMCINOLONE ACETONIDE 40 MG/ML IJ SUSP
40.0000 mg | Freq: Once | INTRAMUSCULAR | Status: AC
  Administered 2021-10-08: 40 mg

## 2021-10-08 MED ORDER — ROPIVACAINE HCL 2 MG/ML IJ SOLN
9.0000 mL | Freq: Once | INTRAMUSCULAR | Status: AC
  Administered 2021-10-08: 9 mL

## 2021-10-08 MED ORDER — LIDOCAINE HCL (PF) 2 % IJ SOLN
INTRAMUSCULAR | Status: AC
  Filled 2021-10-08: qty 5

## 2021-10-08 MED ORDER — ROPIVACAINE HCL 2 MG/ML IJ SOLN
INTRAMUSCULAR | Status: AC
  Filled 2021-10-08: qty 20

## 2021-10-08 MED ORDER — LIDOCAINE HCL 2 % IJ SOLN
20.0000 mL | Freq: Once | INTRAMUSCULAR | Status: AC
  Administered 2021-10-08: 100 mg

## 2021-10-08 MED ORDER — TRIAMCINOLONE ACETONIDE 40 MG/ML IJ SUSP
INTRAMUSCULAR | Status: AC
  Filled 2021-10-08: qty 1

## 2021-10-08 NOTE — Progress Notes (Signed)
Nursing Pain Medication Assessment:  Safety precautions to be maintained throughout the outpatient stay will include: orient to surroundings, keep bed in low position, maintain call bell within reach at all times, provide assistance with transfer out of bed and ambulation.  Nursing Pain Medication Assessment:  Safety precautions to be maintained throughout the outpatient stay will include: orient to surroundings, keep bed in low position, maintain call bell within reach at all times, provide assistance with transfer out of bed and ambulation.  Medication Inspection Compliance: Tanya [name] did not comply with our request to bring her pills to be counted. She was reminded that bringing the medication bottles, even when empty, is a requirement.  Medication: None brought in. Pill/Patch Count: None available to be counted. Bottle Appearance: No container available. Did not bring bottle(s) to appointment. Filled Date: N/A Last Medication intake:  Ran out of medicine more than 48 hours ago

## 2021-10-08 NOTE — Patient Instructions (Signed)
____________________________________________________________________________________________  Virtual Visits   What is a "Virtual Visit"? It is a Metallurgist (medical visit) that takes place on real time (NOT TEXT or E-MAIL) over the telephone or computer device (desktop, laptop, tablet, smart phone, etc.). It allows for more location flexibility between the patient and the healthcare provider.  Who decides when these types of visits will be used? The physician.  Who is eligible for these types of visits? Only those patients that can be reliably reached over the telephone.  What do you mean by reliably? We do not have time to call everyone multiple times, therefore those that tend to screen calls and then call back later are not suitable candidates for this system. We understand how people are reluctant to pickup on "unknown" calls, therefore, we suggest adding our telephone numbers to your list of "CONTACT(s)". This way, you should be able to readily identify our calls when you receive one. All of our numbers are available below.   Who is not eligible? This option is not available for medication management encounters, specially for controlled substances. Patients on pain medications that fall under the category of controlled substances have to come in for "Face-to-Face" encounters. This is required for mandatory monitoring of these substances. You may be asked to provide a sample for an unannounced urine drug screening test (UDS), and we will need to count your pain pills. Not bringing your pills to be counted may result in no refill. Obviously, neither one of these can be done over the phone.  When will this type of visits be used? You can request a virtual visit whenever you are physically unable to attend a regular appointment. The decision will be made by the physician (or healthcare provider) on a case by case basis.   At what time will I be called? This is an  excellent question. The providers will try to call you whenever they have time available. Do not expect to be called at any specific time. The secretaries will assign you a time for your virtual visit appointment, but this is done simply to keep a list of those patients that need to be called, but not for the purpose of keeping a time schedule. Be advised that the call may come in anytime during the day, between the hours of 8:00 AM and 8::00 PM, depending on provider availability. We do understand that the system is not perfect. If you are unable to be available that day on a moments notice, then request an "in-person" appointment rather than a "virtual visit".  Can I request my medication visits to be "Virtual"? Yes you may request it, but the decision is entirely up to the healthcare provider. Control substances require specific monitoring that requires Face-to-Face encounters. The number of encounters  and the extent of the monitoring is determined on a case by case basis.  Add a new contact to your smart phone and label it "PAIN CLINIC" Under this contact add the following numbers: Main: [phone] (Official Contact Number) Nurses: [phone] (These are outgoing only calling systems. Do not call this number.) Dr. Dossie Arbour: [phone] or [phone] (Outgoing calls only. Do not call this number.)  ____________________________________________________________________________________________  ____________________________________________________________________________________________  Post-Procedure Discharge Instructions  Instructions: Apply ice:  Purpose: This will minimize any swelling and discomfort after procedure.  When: Day of procedure, as soon as you get home. How: Fill a plastic sandwich bag with crushed ice. Cover it with a small towel and apply to injection  site. How long: (15 min on, 15 min off) Apply for 15 minutes then remove x 15 minutes.  Repeat sequence on day of  procedure, until you go to bed. Apply heat:  Purpose: To treat any soreness and discomfort from the procedure. When: Starting the next day after the procedure. How: Apply heat to procedure site starting the day following the procedure. How long: May continue to repeat daily, until discomfort goes away. Food intake: Start with clear liquids (like water) and advance to regular food, as tolerated.  Physical activities: Keep activities to a minimum for the first 8 hours after the procedure. After that, then as tolerated. Driving: If you have received any sedation, be responsible and do not drive. You are not allowed to drive for 24 hours after having sedation. Blood thinner: (Applies only to those taking blood thinners) You may restart your blood thinner 6 hours after your procedure. Insulin: (Applies only to Diabetic patients taking insulin) As soon as you can eat, you may resume your normal dosing schedule. Infection prevention: Keep procedure site clean and dry. Shower daily and clean area with soap and water. Post-procedure Pain Diary: Extremely important that this be done correctly and accurately. Recorded information will be used to determine the next step in treatment. For the purpose of accuracy, follow these rules: Evaluate only the area treated. Do not report or include pain from an untreated area. For the purpose of this evaluation, ignore all other areas of pain, except for the treated area. After your procedure, avoid taking a long nap and attempting to complete the pain diary after you wake up. Instead, set your alarm clock to go off every hour, on the hour, for the initial 8 hours after the procedure. Document the duration of the numbing medicine, and the relief you are getting from it. Do not go to sleep and attempt to complete it later. It will not be accurate. If you received sedation, it is likely that you were given a medication that may cause amnesia. Because of this, completing the  diary at a later time may cause the information to be inaccurate. This information is needed to plan your care. Follow-up appointment: Keep your post-procedure follow-up evaluation appointment after the procedure (usually 2 weeks for most procedures, 6 weeks for radiofrequencies). DO NOT FORGET to bring you pain diary with you.   Expect: (What should I expect to see with my procedure?) From numbing medicine (AKA: Local Anesthetics): Numbness or decrease in pain. You may also experience some weakness, which if present, could last for the duration of the local anesthetic. Onset: Full effect within 15 minutes of injected. Duration: It will depend on the type of local anesthetic used. On the average, 1 to 8 hours.  From steroids (Applies only if steroids were used): Decrease in swelling or inflammation. Once inflammation is improved, relief of the pain will follow. Onset of benefits: Depends on the amount of swelling present. The more swelling, the longer it will take for the benefits to be seen. In some cases, up to 10 days. Duration: Steroids will stay in the system x 2 weeks. Duration of benefits will depend on multiple posibilities including persistent irritating factors. Side-effects: If present, they may typically last 2 weeks (the duration of the steroids). Frequent: Cramps (if they occur, drink Gatorade and take over-the-counter Magnesium 450-500 mg once to twice a day); water retention with temporary weight gain; increases in blood sugar; decreased immune system response; increased appetite. Occasional: Facial flushing (red, warm  cheeks); mood swings; menstrual changes. Uncommon: Long-term decrease or suppression of natural hormones; bone thinning. (These are more common with higher doses or more frequent use. This is why we prefer that our patients avoid having any injection therapies in other practices.)  Very Rare: Severe mood changes; psychosis; aseptic necrosis. From procedure: Some  discomfort is to be expected once the numbing medicine wears off. This should be minimal if ice and heat are applied as instructed.  Call if: (When should I call?) You experience numbness and weakness that gets worse with time, as opposed to wearing off. New onset bowel or bladder incontinence. (Applies only to procedures done in the spine)  Emergency Numbers: Durning business hours (Monday - Thursday, 8:00 AM - 4:00 PM) (Friday, 9:00 AM - 12:00 Noon): [phone] After hours: [phone] NOTE: If you are having a problem and are unable connect with, or to talk to a provider, then go to your nearest urgent care or emergency department. If the problem is serious and urgent, please call 911. ____________________________________________________________________________________________  ____________________________________________________________________________________________  Pharmacy Shortages of Pain Medication   Introduction Shockingly as it may seem, .  "No U.[place] Supreme Court decision has ever interpreted the Constitution as guaranteeing a right to health care for all Americans." - https://huff.com/  "With respect to human rights, the [place] has no formally codified right to health, nor does it participate in a human rights treaty that specifies a right to health." - Scott J. Schweikart, JD, MBE  Situation By now, most of our patients have had the experience of being told by their pharmacist that they do not have enough medication to cover their prescription. If you have not had this experience, just know that you soon will.  Problem There appears to be a shortage of these medications, either at the national level or locally. This is happening with all pharmacies. When there is not enough medication, patients are offered a partial fill and they are told that they will try to get the rest of the medicine for  them at a later time. If they do not have enough for even a partial fill, the pharmacists are telling the patients to call us (the prescribing physicians) to request that we send another prescription to another pharmacy to get the medicine.   This reordering of a controlled substance creates documentation problems where additional paperwork needs to be created to explain why two prescriptions for the same period of time and the same medicine are being prescribed to the same patient. It also creates situations where the last appointment note does not accurately reflect when and what prescriptions were given to a patient. This leads to prescribing errors down the line, in subsequent follow-up visits.   [employer] of Pharmacy ([employer]) Research revealed that Surveyor, quantity .1806 (21 NCAC 46.1806) authorizes pharmacists to the transfer of prescriptions among pharmacies, and it sets forth procedural and recordkeeping requirements for doing so. However, this requires the pharmacist to complete the previously mentioned procedural paperwork to accomplish the transfer. As it turns out, it is much easier for them to have the prescribing physicians do the work.   Possible solutions 1. You can ask your physician to assist you in weaning yourself off these medications. 2. Ask your pharmacy if the medication is in stock, 3 days prior to your refill. 3. If you need a pharmacy change, let us know at your medication management visit. Prescriptions that have already been electronically sent to a pharmacy  will not be re-sent to a different pharmacy if your pharmacy of record does not have it in stock. Proper stocking of medication is a pharmacy problem, not a prescriber problem. Work with your pharmacist to solve the problem. 4. Have the [facility] Assembly add a provision to the "STOP ACT" (the law that mandates how controlled substances are prescribed) where there is an exception to the  electronic prescribing rule that states that in the event there are shortages of medications the physicians are allowed to use written prescriptions as opposed to electronic ones. This would allow patients to take their prescriptions to a different pharmacy that may have enough medication available to fill the prescription. The problem is that currently there is a law that does not allow for written prescriptions, with the exception of instances where the electronic medical record is down due to technical issues.  5. Have [place] Congress ease the pressure on pharmaceutical companies, allowing them to produce enough quantities of the medication to adequately supply the population. 6. Have pharmacies keep enough stocks of these medications to cover their client base.  7. Have the [facility] Assembly add a provision to the "STOP ACT" where they ease the regulations surrounding the transfer of controlled substances between pharmacies, so as to simplify the transfer of supplies. As an alternative, develop a system to allow patients to obtain the remainder of their prescription at another one of their pharmacies or at an associate pharmacy.   How this shortage will affect you.  Understand that this is a pharmacy supply problem, not a prescriber problem. Work with your pharmacy to solve it. The job of the prescriber is to evaluate and monitor the patient for the appropriate indications and use of these medicines. It is not the job of the prescriber to supply the medication or to solve problems with that supply. The responsibility and the choice to obtain the medication resides on the patient. By law, supplying the medication is the job of the pharmacy. It is certainly not the job of the prescriber to solve supply problems.   Due to the above problems we are no longer taking patients to write for their pain medication. Future discussions with your physician may include potentially weaning medications or  transitioning to alternatives.  We will be focusing primarily on interventional based pain management. We will continue to evaluate for appropriate indications and we may provide recommendations regarding medication, dose, and schedule, as well as monitoring recommendations, however, we will not be taking over the actual prescribing of these substances. On those patients where we are treating their chronic pain with interventional therapies, exceptions will be considered on a case by case basis. At this time, we will try to continue providing this supplemental service to those patients we have been managing in the past. However, as of August 1st, 2023, we no longer will be sending additional prescriptions to other pharmacies for the purpose of solving their supply problems. Once we send a prescription to a pharmacy, we will not be resending it again to another pharmacy to cover for their shortages.   What to do. Write as many letters as you can. Recruit the help of family members in writing these letters. Below are some of the places where you can write to make your voice heard. Let them know what the problem is and push them to look for solutions.   Search internet for: "[employer] find your legislators" NoseSwap.is  Search internet for: "[employer]  Programmer, applications complaints" Starlas.fi  Search internet for: "[place] complaints" https://www.hernandez-brewer.com/.htm  Search internet for: "[facility] pharmacy complaints" Email [facility] Customer Relations woondaal.com.jsp?callType=store  Search internet for: Programme researcher, broadcasting/film/video customer service complaints" https://www.[facility].com/topic/marketing/contactus/contactus_customerservice.jsp  ____________________________________________________________________________________________   ____________________________________________________________________________________________  Medication Rules  Purpose: To inform patients, and their family members, of our rules and regulations.  Applies to: All patients receiving prescriptions (written or electronic).  Pharmacy of record: Pharmacy where electronic prescriptions will be sent. If written prescriptions are taken to a different pharmacy, please inform the nursing staff. The pharmacy listed in the electronic medical record should be the one where you would like electronic prescriptions to be sent.  Electronic prescriptions: In compliance with the [place] (STOP) Act of 2017 (Session Lanny Cramp [phone]), effective January 21, 2018, all controlled substances must be electronically prescribed. Calling prescriptions to the pharmacy will cease to exist.  Prescription refills: Only during scheduled appointments. Applies to all prescriptions.  NOTE: The following applies primarily to controlled substances (Opioid* Pain Medications).   Type of encounter (visit): For patients receiving controlled substances, face-to-face visits are required. (Not an option or up to the patient.)  Patient's responsibilities: Pain Pills: Bring all pain pills to every appointment (except for procedure appointments). Pill Bottles: Bring pills in original pharmacy bottle. Always bring the newest bottle. Bring bottle, even if empty. Medication refills: You are responsible for knowing and keeping track of what medications you take and those you need refilled. The day before your appointment: write a list of all prescriptions that need to be refilled. The day of the appointment: give the list to the admitting nurse. Prescriptions will be written only during appointments. No prescriptions will be written on procedure days. If you forget a medication: it will not be "Called in", "Faxed", or "electronically sent". You will  need to get another appointment to get these prescribed. No early refills. Do not call asking to have your prescription filled early. Prescription Accuracy: You are responsible for carefully inspecting your prescriptions before leaving our office. Have the discharge nurse carefully go over each prescription with you, before taking them home. Make sure that your name is accurately spelled, that your address is correct. Check the name and dose of your medication to make sure it is accurate. Check the number of pills, and the written instructions to make sure they are clear and accurate. Make sure that you are given enough medication to last until your next medication refill appointment. Taking Medication: Take medication as prescribed. When it comes to controlled substances, taking less pills or less frequently than prescribed is permitted and encouraged. Never take more pills than instructed. Never take medication more frequently than prescribed.  Inform other Doctors: Always inform, all of your healthcare providers, of all the medications you take. Pain Medication from other Providers: You are not allowed to accept any additional pain medication from any other Doctor or Healthcare provider. There are two exceptions to this rule. (see below) In the event that you require additional pain medication, you are responsible for notifying us, as stated below. Cough Medicine: Often these contain an opioid, such as codeine or hydrocodone. Never accept or take cough medicine containing these opioids if you are already taking an opioid* medication. The combination may cause respiratory failure and death. Medication Agreement: You are responsible for carefully reading and following our Medication Agreement. This must be signed before receiving any prescriptions from our practice. Safely store a copy of your signed Agreement.  Violations to the Agreement will result in no further prescriptions. (Additional copies of our  Medication Agreement are available upon request.) Laws, Rules, & Regulations: All patients are expected to follow all Federal and [employer], [employer], Rules, [employer]. Ignorance of the Laws does not constitute a valid excuse.  Illegal drugs and Controlled Substances: The use of illegal substances (including, but not limited to marijuana and its derivatives) and/or the illegal use of any controlled substances is strictly prohibited. Violation of this rule may result in the immediate and permanent discontinuation of any and all prescriptions being written by our practice. The use of any illegal substances is prohibited. Adopted CDC guidelines & recommendations: Target dosing levels will be at or below 60 MME/day. Use of benzodiazepines** is not recommended.  Exceptions: There are only two exceptions to the rule of not receiving pain medications from other Healthcare Providers. Exception #1 (Emergencies): In the event of an emergency (i.e.: accident requiring emergency care), you are allowed to receive additional pain medication. However, you are responsible for: As soon as you are able, call our office [phone], at any time of the day or night, and leave a message stating your name, the date and nature of the emergency, and the name and dose of the medication prescribed. In the event that your call is answered by a member of our staff, make sure to document and save the date, time, and the name of the person that took your information.  Exception #2 (Planned Surgery): In the event that you are scheduled by another doctor or dentist to have any type of surgery or procedure, you are allowed (for a period no longer than 30 days), to receive additional pain medication, for the acute post-op pain. However, in this case, you are responsible for picking up a copy of our "Post-op Pain Management for Surgeons" handout, and giving it to your surgeon or dentist. This document is available at our office, and  does not require an appointment to obtain it. Simply go to our office during business hours (Monday-Thursday from 8:00 AM to 4:00 PM) (Friday 8:00 AM to 12:00 Noon) or if you have a scheduled appointment with [place], prior to your surgery, and ask for it by name. In addition, you are responsible for: calling our office [phone], at any time of the day or night, and leaving a message stating your name, name of your surgeon, type of surgery, and date of procedure or surgery. Failure to comply with your responsibilities may result in termination of therapy involving the controlled substances. Medication Agreement Violation. Following the above rules, including your responsibilities will help you in avoiding a Medication Agreement Violation ("Breaking your Pain Medication Contract").  *Opioid medications include: morphine, codeine, oxycodone, oxymorphone, hydrocodone, hydromorphone, meperidine, tramadol, tapentadol, buprenorphine, fentanyl, methadone. **Benzodiazepine medications include: diazepam (Valium), alprazolam (Xanax), clonazepam (Klonopine), lorazepam (Ativan), clorazepate (Tranxene), chlordiazepoxide (Librium), estazolam (Prosom), oxazepam (Serax), temazepam (Restoril), triazolam (Halcion) (Last updated: 10/18/2020) ____________________________________________________________________________________________  ____________________________________________________________________________________________  Medication Recommendations and Reminders  Applies to: All patients receiving prescriptions (written and/or electronic).  Medication Rules & Regulations: These rules and regulations exist for your safety and that of others. They are not flexible and neither are we. Dismissing or ignoring them will be considered "non-compliance" with medication therapy, resulting in complete and irreversible termination of such therapy. (See document titled "Medication Rules" for more details.) In all conscience,  because of safety reasons, we cannot continue providing a therapy where the patient does not follow instructions.  Pharmacy of  record:  Definition: This is the pharmacy where your electronic prescriptions will be sent.  We do not endorse any particular pharmacy, however, we have experienced problems with [facility] not securing enough medication supply for the community. We do not restrict you in your choice of pharmacy. However, once we write for your prescriptions, we will NOT be re-sending more prescriptions to fix restricted supply problems created by your pharmacy, or your insurance.  The pharmacy listed in the electronic medical record should be the one where you want electronic prescriptions to be sent. If you choose to change pharmacy, simply notify our nursing staff.  Recommendations: Keep all of your pain medications in a safe place, under lock and key, even if you live alone. We will NOT replace lost, stolen, or damaged medication. After you fill your prescription, take 1 week's worth of pills and put them away in a safe place. You should keep a separate, properly labeled bottle for this purpose. The remainder should be kept in the original bottle. Use this as your primary supply, until it runs out. Once it's gone, then you know that you have 1 week's worth of medicine, and it is time to come in for a prescription refill. If you do this correctly, it is unlikely that you will ever run out of medicine. To make sure that the above recommendation works, it is very important that you make sure your medication refill appointments are scheduled at least 1 week before you run out of medicine. To do this in an effective manner, make sure that you do not leave the office without scheduling your next medication management appointment. Always ask the nursing staff to show you in your prescription , when your medication will be running out. Then arrange for the receptionist to get you a return appointment,  at least 7 days before you run out of medicine. Do not wait until you have 1 or 2 pills left, to come in. This is very poor planning and does not take into consideration that we may need to cancel appointments due to bad weather, sickness, or emergencies affecting our staff. DO NOT ACCEPT A "Partial Fill": If for any reason your pharmacy does not have enough pills/tablets to completely fill or refill your prescription, do not allow for a "partial fill". The law allows the pharmacy to complete that prescription within 72 hours, without requiring a new prescription. If they do not fill the rest of your prescription within those 72 hours, you will need a separate prescription to fill the remaining amount, which we will NOT provide. If the reason for the partial fill is your insurance, you will need to talk to the pharmacist about payment alternatives for the remaining tablets, but again, DO NOT ACCEPT A PARTIAL FILL, unless you can trust your pharmacist to obtain the remainder of the pills within 72 hours.  Prescription refills and/or changes in medication(s):  Prescription refills, and/or changes in dose or medication, will be conducted only during scheduled medication management appointments. (Applies to both, written and electronic prescriptions.) No refills on procedure days. No medication will be changed or started on procedure days. No changes, adjustments, and/or refills will be conducted on a procedure day. Doing so will interfere with the diagnostic portion of the procedure. No phone refills. No medications will be "called into the pharmacy". No Fax refills. No weekend refills. No Holliday refills. No after hours refills.  Remember:  Business hours are:  Monday to Thursday 8:00 AM to 4:00 PM Provider's  Schedule: Milinda Pointer, MD - Appointments are:  Medication management: Monday and Wednesday 8:00 AM to 4:00 PM Procedure day: Tuesday and Thursday 7:30 AM to 4:00 PM Gillis Santa, MD -  Appointments are:  Medication management: Tuesday and Thursday 8:00 AM to 4:00 PM Procedure day: Monday and Wednesday 7:30 AM to 4:00 PM (Last update: 08/11/2019) ____________________________________________________________________________________________  ____________________________________________________________________________________________  CBD (cannabidiol) & Delta-8 (Delta-8 tetrahydrocannabinol) WARNING  Intro: Cannabidiol (CBD) and tetrahydrocannabinol (THC), are two natural compounds found in plants of the Cannabis genus. They can both be extracted from hemp or cannabis. Hemp and cannabis come from the Cannabis sativa plant. Both compounds interact with your body's endocannabinoid system, but they have very different effects. CBD does not produce the high sensation associated with cannabis. Delta-8 tetrahydrocannabinol, also known as delta-8 THC, is a psychoactive substance found in the Cannabis sativa plant, of which marijuana and hemp are two varieties. THC is responsible for the high associated with the illicit use of marijuana.  Applicable to: All individuals currently taking or considering taking CBD (cannabidiol) and, more important, all patients taking opioid analgesic controlled substances (pain medication). (Example: oxycodone; oxymorphone; hydrocodone; hydromorphone; morphine; methadone; tramadol; tapentadol; fentanyl; buprenorphine; butorphanol; dextromethorphan; meperidine; codeine; etc.)  Legal status: CBD remains a Schedule I drug prohibited for any use. CBD is illegal with one exception. In the [place], CBD has a limited Transport planner (FDA) approval for the treatment of two specific types of epilepsy disorders. Only one CBD product has been approved by the FDA for this purpose: "Epidiolex". FDA is aware that some companies are marketing products containing cannabis and cannabis-derived compounds in ways that violate the [employer], Drug and Cosmetic Act  [facility] Act) and that may put the health and safety of consumers at risk. The FDA, a Federal agency, has not enforced the CBD status since 2018. UPDATE: (03/09/2021) The Drug Enforcement Agency ([place]) issued a letter stating that "delta" cannabinoids, including Delta-8-THCO and Delta-9-THCO, synthetically derived from hemp do not qualify as hemp and will be viewed as Schedule I drugs. (Schedule I drugs, substances, or chemicals are defined as drugs with no currently accepted medical use and a high potential for abuse. Some examples of Schedule I drugs are: heroin, lysergic acid diethylamide (LSD), marijuana (cannabis), 3,4-methylenedioxymethamphetamine (ecstasy), methaqualone, and peyote.) (https://jennings.com/)  Legality: Some manufacturers ship CBD products nationally, which is illegal. Often such products are sold online and are therefore available throughout the country. CBD is openly sold in head shops and health food stores in some states where such sales have not been explicitly legalized. Selling unapproved products with unsubstantiated therapeutic claims is not only a violation of the law, but also can put patients at risk, as these products have not been proven to be safe or effective. Federal illegality makes it difficult to conduct research on CBD.  Reference: "FDA Regulation of Cannabis and Cannabis-Derived Products, Including Cannabidiol (CBD)" - [employer]Artists.[employer].pt  Warning: CBD is not FDA approved and has not undergo the same manufacturing controls as prescription drugs.  This means that the purity and safety of available CBD may be questionable. Most of the time, despite manufacturer's claims, it is contaminated with THC (delta-9-tetrahydrocannabinol - the chemical in marijuana responsible for the "HIGH").  When this is the case, the [facility] contaminant will trigger a positive urine drug  screen (UDS) test for Marijuana (carboxy-THC). Because a positive UDS for any illicit substance is a violation of our medication agreement, your opioid analgesics (pain medicine) may be permanently discontinued. The  FDA recently put out a warning about 5 things that everyone should be aware of regarding Delta-8 THC: Delta-8 THC products have not been evaluated or approved by the FDA for safe use and may be marketed in ways that put the public health at risk. The FDA has received adverse event reports involving delta-8 THC-containing products. Delta-8 THC has psychoactive and intoxicating effects. Delta-8 THC manufacturing often involve use of potentially harmful chemicals to create the concentrations of delta-8 THC claimed in the marketplace. The final delta-8 THC product may have potentially harmful by-products (contaminants) due to the chemicals used in the process. Manufacturing of delta-8 THC products may occur in uncontrolled or unsanitary settings, which may lead to the presence of unsafe contaminants or other potentially harmful substances. Delta-8 THC products should be kept out of the reach of children and pets.  MORE ABOUT CBD  General Information: CBD was discovered in [birth year] and it is a derivative of the cannabis sativa genus plants (Marijuana and Hemp). It is one of the 113 identified substances found in Marijuana. It accounts for up to 40% of the plant's extract. As of 2018, preliminary clinical studies on CBD included research for the treatment of anxiety, movement disorders, and pain. CBD is available and consumed in multiple forms, including inhalation of smoke or vapor, as an aerosol spray, and by mouth. It may be supplied as an oil containing CBD, capsules, dried cannabis, or as a liquid solution. CBD is thought not to be as psychoactive as THC (delta-9-tetrahydrocannabinol - the chemical in marijuana responsible for the "HIGH"). Studies suggest that CBD may interact with different  biological target receptors in the body, including cannabinoid and other neurotransmitter receptors. As of 2018 the mechanism of action for its biological effects has not been determined.  Side-effects  Adverse reactions: Dry mouth, diarrhea, decreased appetite, fatigue, drowsiness, malaise, weakness, sleep disturbances, and others.  Drug interactions: CBC may interact with other medications such as blood-thinners. Because CBD causes drowsiness on its own, it also increases the drowsiness caused by other medications, including antihistamines (such as Benadryl), benzodiazepines (Xanax, Ativan, Valium), antipsychotics, antidepressants and opioids, as well as alcohol and supplements such as kava, melatonin and St. John's Wort. Be cautious with the following combinations:   Brivaracetam (Briviact) Brivaracetam is changed and broken down by the body. CBD might decrease how quickly the body breaks down brivaracetam. This might increase levels of brivaracetam in the body.  Caffeine Caffeine is changed and broken down by the body. CBD might decrease how quickly the body breaks down caffeine. This might increase levels of caffeine in the body.  Carbamazepine (Tegretol) Carbamazepine is changed and broken down by the body. CBD might decrease how quickly the body breaks down carbamazepine. This might increase levels of carbamazepine in the body and increase its side effects.  Citalopram (Celexa) Citalopram is changed and broken down by the body. CBD might decrease how quickly the body breaks down citalopram. This might increase levels of citalopram in the body and increase its side effects.  Clobazam (Onfi) Clobazam is changed and broken down by the liver. CBD might decrease how quickly the liver breaks down clobazam. This might increase the effects and side effects of clobazam.  Eslicarbazepine (Aptiom) Eslicarbazepine is changed and broken down by the body. CBD might decrease how quickly the body  breaks down eslicarbazepine. This might increase levels of eslicarbazepine in the body by a small amount.  Everolimus (Zostress) Everolimus is changed and broken down by the body. CBD might decrease  how quickly the body breaks down everolimus. This might increase levels of everolimus in the body.  Lithium Taking higher doses of CBD might increase levels of lithium. This can increase the risk of lithium toxicity.  Medications changed by the liver (Cytochrome P450 1A1 (CYP1A1) substrates) Some medications are changed and broken down by the liver. CBD might change how quickly the liver breaks down these medications. This could change the effects and side effects of these medications.  Medications changed by the liver (Cytochrome P450 1A2 (CYP1A2) substrates) Some medications are changed and broken down by the liver. CBD might change how quickly the liver breaks down these medications. This could change the effects and side effects of these medications.  Medications changed by the liver (Cytochrome P450 1B1 (CYP1B1) substrates) Some medications are changed and broken down by the liver. CBD might change how quickly the liver breaks down these medications. This could change the effects and side effects of these medications.  Medications changed by the liver (Cytochrome P450 2A6 (CYP2A6) substrates) Some medications are changed and broken down by the liver. CBD might change how quickly the liver breaks down these medications. This could change the effects and side effects of these medications.  Medications changed by the liver (Cytochrome P450 2B6 (CYP2B6) substrates) Some medications are changed and broken down by the liver. CBD might change how quickly the liver breaks down these medications. This could change the effects and side effects of these medications.  Medications changed by the liver (Cytochrome P450 2C19 (CYP2C19) substrates) Some medications are changed and broken down by the liver.  CBD might change how quickly the liver breaks down these medications. This could change the effects and side effects of these medications.  Medications changed by the liver (Cytochrome P450 2C8 (CYP2C8) substrates) Some medications are changed and broken down by the liver. CBD might change how quickly the liver breaks down these medications. This could change the effects and side effects of these medications.  Medications changed by the liver (Cytochrome P450 2C9 (CYP2C9) substrates) Some medications are changed and broken down by the liver. CBD might change how quickly the liver breaks down these medications. This could change the effects and side effects of these medications.  Medications changed by the liver (Cytochrome P450 2D6 (CYP2D6) substrates) Some medications are changed and broken down by the liver. CBD might change how quickly the liver breaks down these medications. This could change the effects and side effects of these medications.  Medications changed by the liver (Cytochrome P450 2E1 (CYP2E1) substrates) Some medications are changed and broken down by the liver. CBD might change how quickly the liver breaks down these medications. This could change the effects and side effects of these medications.  Medications changed by the liver (Cytochrome P450 3A4 (CYP3A4) substrates) Some medications are changed and broken down by the liver. CBD might change how quickly the liver breaks down these medications. This could change the effects and side effects of these medications.  Medications changed by the liver (Glucuronidated drugs) Some medications are changed and broken down by the liver. CBD might change how quickly the liver breaks down these medications. This could change the effects and side effects of these medications.  Medications that decrease the breakdown of other medications by the liver (Cytochrome P450 2C19 (CYP2C19) inhibitors) CBD is changed and broken down by the liver.  Some drugs decrease how quickly the liver changes and breaks down CBD. This could change the effects and side effects of CBD.  Medications that decrease the breakdown of other medications in the liver (Cytochrome P450 3A4 (CYP3A4) inhibitors) CBD is changed and broken down by the liver. Some drugs decrease how quickly the liver changes and breaks down CBD. This could change the effects and side effects of CBD.  Medications that increase breakdown of other medications by the liver (Cytochrome P450 3A4 (CYP3A4) inducers) CBD is changed and broken down by the liver. Some drugs increase how quickly the liver changes and breaks down CBD. This could change the effects and side effects of CBD.  Medications that increase the breakdown of other medications by the liver (Cytochrome P450 2C19 (CYP2C19) inducers) CBD is changed and broken down by the liver. Some drugs increase how quickly the liver changes and breaks down CBD. This could change the effects and side effects of CBD.  Methadone (Dolophine) Methadone is broken down by the liver. CBD might decrease how quickly the liver breaks down methadone. Taking cannabidiol along with methadone might increase the effects and side effects of methadone.  Rufinamide (Banzel) Rufinamide is changed and broken down by the body. CBD might decrease how quickly the body breaks down rufinamide. This might increase levels of rufinamide in the body by a small amount.  Sedative medications (CNS depressants) CBD might cause sleepiness and slowed breathing. Some medications, called sedatives, can also cause sleepiness and slowed breathing. Taking CBD with sedative medications might cause breathing problems and/or too much sleepiness.  Sirolimus (Rapamune) Sirolimus is changed and broken down by the body. CBD might decrease how quickly the body breaks down sirolimus. This might increase levels of sirolimus in the body.  Stiripentol (Diacomit) Stiripentol is changed and  broken down by the body. CBD might decrease how quickly the body breaks down stiripentol. This might increase levels of stiripentol in the body and increase its side effects.  Tacrolimus (Prograf) Tacrolimus is changed and broken down by the body. CBD might decrease how quickly the body breaks down tacrolimus. This might increase levels of tacrolimus in the body.  Tamoxifen (Soltamox) Tamoxifen is changed and broken down by the body. CBD might affect how quickly the body breaks down tamoxifen. This might affect levels of tamoxifen in the body.  Topiramate (Topamax) Topiramate is changed and broken down by the body. CBD might decrease how quickly the body breaks down topiramate. This might increase levels of topiramate in the body by a small amount.  Valproate Valproic acid can cause liver injury. Taking cannabidiol with valproic acid might increase the chance of liver injury. CBD and/or valproic acid might need to be stopped, or the dose might need to be reduced.  Warfarin (Coumadin) CBD might increase levels of warfarin, which can increase the risk for bleeding. CBD and/or warfarin might need to be stopped, or the dose might need to be reduced.  Zonisamide Zonisamide is changed and broken down by the body. CBD might decrease how quickly the body breaks down zonisamide. This might increase levels of zonisamide in the body by a small amount. (Last update: 03/21/2021) ____________________________________________________________________________________________  ____________________________________________________________________________________________  Drug Holidays (Slow)  What is a "Drug Holiday"? Drug Holiday: is the name given to the period of time during which a patient stops taking a medication(s) for the purpose of eliminating tolerance to the drug.  Benefits Improved effectiveness of opioids. Decreased opioid dose needed to achieve benefits. Improved pain with lesser  dose.  What is tolerance? Tolerance: is the progressive decreased in effectiveness of a drug due to its repetitive use. With repetitive use,  the body gets use to the medication and as a consequence, it loses its effectiveness. This is a common problem seen with opioid pain medications. As a result, a larger dose of the drug is needed to achieve the same effect that used to be obtained with a smaller dose.  How long should a "Drug Holiday" last? You should stay off of the pain medicine for at least 14 consecutive days. (2 weeks)  Should I stop the medicine "cold [place]"? No. You should always coordinate with your Pain Specialist so that he/she can provide you with the correct medication dose to make the transition as smoothly as possible.  How do I stop the medicine? Slowly. You will be instructed to decrease the daily amount of pills that you take by one (1) pill every seven (7) days. This is called a "slow downward taper" of your dose. For example: if you normally take four (4) pills per day, you will be asked to drop this dose to three (3) pills per day for seven (7) days, then to two (2) pills per day for seven (7) days, then to one (1) per day for seven (7) days, and at the end of those last seven (7) days, this is when the "Drug Holiday" would start.   Will I have withdrawals? By doing a "slow downward taper" like this one, it is unlikely that you will experience any significant withdrawal symptoms. Typically, what triggers withdrawals is the sudden stop of a high dose opioid therapy. Withdrawals can usually be avoided by slowly decreasing the dose over a prolonged period of time. If you do not follow these instructions and decide to stop your medication abruptly, withdrawals may be possible.  What are withdrawals? Withdrawals: refers to the wide range of symptoms that occur after stopping or dramatically reducing opiate drugs after heavy and prolonged use. Withdrawal symptoms do not occur to  patients that use low dose opioids, or those who take the medication sporadically. Contrary to benzodiazepine (example: Valium, Xanax, etc.) or alcohol withdrawals ("Delirium Tremens"), opioid withdrawals are not lethal. Withdrawals are the physical manifestation of the body getting rid of the excess receptors.  Expected Symptoms Early symptoms of withdrawal may include: Agitation Anxiety Muscle aches Increased tearing Insomnia Runny nose Sweating Yawning  Late symptoms of withdrawal may include: Abdominal cramping Diarrhea Dilated pupils Goose bumps Nausea Vomiting  Will I experience withdrawals? Due to the slow nature of the taper, it is very unlikely that you will experience any.  What is a slow taper? Taper: refers to the gradual decrease in dose.  (Last update: 08/11/2019) ____________________________________________________________________________________________

## 2021-10-08 NOTE — Progress Notes (Signed)
PROVIDER NOTE: Interpretation of information contained herein should be left to medically-trained personnel. Specific patient instructions are provided elsewhere under "Patient Instructions" section of medical record. This document was created in part using STT-dictation technology, any transcriptional errors that may result from this process are unintentional.  Patient: Tanya [name] Type: Established DOB: Dec 10, 1960 MRN: [id number] PCP: Tanya Ramsay, MD  Service: Procedure DOS: 10/08/2021 Setting: Ambulatory Location: Ambulatory outpatient facility Delivery: Face-to-face Provider: Gaspar Cola, MD Specialty: Interventional Pain Management Specialty designation: 09 Location: Outpatient facility Ref. Prov.: Tanya Ramsay, MD    Primary Reason for Visit: Interventional Pain Management Treatment. CC: Neck Pain (Lower mid)    Procedure:          Anesthesia, Analgesia, Anxiolysis:  Type: Cervical thoracic Trigger Point Injection (1-2 muscle groups)          CPT: 20552 Primary Purpose: Therapeutic Region: Posterior Cervicothoracic Level:  C7 level Target Area: Cervical thoracic Trigger Point, C7 area interspinous bursa Approach: Percutaneous, ipsilateral approach. Laterality: Midline Paraspinal  Type: Local Anesthesia Local Anesthetic: Lidocaine 1-2% Sedation: None  Indication(s):  Analgesia Route: Infiltration ([place]/IM) IV Access: N/A   Position: Sitting   1. Cervicothoracic interspinous bursitis   2. Acute upper back pain (Midline)   3. Trigger point with neck pain   4. Chronic pain syndrome   5. Chronic neck pain (1ry area of Pain) (Bilateral) (R>L)   6. Chronic upper back pain (2ry area of Pain) (Bilateral) (R>L)    NAS-11 Pain score:   Pre-procedure: 8 /10   Post-procedure: 8 /10     Pre-op H&P Assessment:  [name] is a 61 y.o. (year old), female patient, seen today for interventional treatment. She  has a past surgical history that includes  Abdominal hysterectomy; Colonoscopy; Colonoscopy with propofol (N/A, 07/03/2017); Tonsillectomy; Esophagogastroduodenoscopy (N/A, 10/21/2017); and Colonoscopy with propofol (N/A, 10/21/2017). [name] has a current medication list which includes the following prescription(s): aripiprazole, atorvastatin, duloxetine, eszopiclone, gabapentin, hydroxychloroquine, nicotine, oxycodone, [START ON 11/07/2021] oxycodone, [START ON 12/07/2021] oxycodone, ventolin hfa, and baclofen, and the following Facility-Administered Medications: albuterol. Her primarily concern today is the Neck Pain (Lower mid)  Initial Vital Signs:  Pulse/HCG Rate: 88  Temp: (!) 97.3 F (36.3 C) Resp:   BP: (!) 136/93 SpO2: 100 %  BMI: Estimated body mass index is 25.15 kg/m as calculated from the following:   Height as of this encounter: '5\' 3"'$  (1.6 m).   Weight as of this encounter: 142 lb (64.4 kg).  Risk Assessment: Allergies: Reviewed. She is allergic to pregabalin, trazodone, bupropion, methocarbamol, and tramadol.  Allergy Precautions: None required Coagulopathies: Reviewed. None identified.  Blood-thinner therapy: None at this time Active Infection(s): Reviewed. None identified. [name] is afebrile  Site Confirmation: [name] was asked to confirm the procedure and laterality before marking the site Procedure checklist: Completed Consent: Before the procedure and under the influence of no sedative(s), amnesic(s), or anxiolytics, the patient was informed of the treatment options, risks and possible complications. To fulfill our ethical and legal obligations, as recommended by the American Medical Association's Code of Ethics, I have informed the patient of my clinical impression; the nature and purpose of the treatment or procedure; the risks, benefits, and possible complications of the intervention; the alternatives, including doing nothing; the risk(s) and benefit(s) of the alternative treatment(s) or  procedure(s); and the risk(s) and benefit(s) of doing nothing. The patient was provided information about the general risks and possible complications associated with the procedure. These may  include, but are not limited to: failure to achieve desired goals, infection, bleeding, organ or nerve damage, allergic reactions, paralysis, and death. In addition, the patient was informed of those risks and complications associated to the procedure, such as failure to decrease pain; infection; bleeding; organ or nerve damage with subsequent damage to sensory, motor, and/or autonomic systems, resulting in permanent pain, numbness, and/or weakness of one or several areas of the body; allergic reactions; (i.e.: anaphylactic reaction); and/or death. Furthermore, the patient was informed of those risks and complications associated with the medications. These include, but are not limited to: allergic reactions (i.e.: anaphylactic or anaphylactoid reaction(s)); adrenal axis suppression; blood sugar elevation that in diabetics may result in ketoacidosis or comma; water retention that in patients with history of congestive heart failure may result in shortness of breath, pulmonary edema, and decompensation with resultant heart failure; weight gain; swelling or edema; medication-induced neural toxicity; particulate matter embolism and blood vessel occlusion with resultant organ, and/or nervous system infarction; and/or aseptic necrosis of one or more joints. Finally, the patient was informed that Medicine is not an exact science; therefore, there is also the possibility of unforeseen or unpredictable risks and/or possible complications that may result in a catastrophic outcome. The patient indicated having understood very clearly. We have given the patient no guarantees and we have made no promises. Enough time was given to the patient to ask questions, all of which were answered to the patient's satisfaction. [name] has  indicated that she wanted to continue with the procedure. Attestation: I, the ordering provider, attest that I have discussed with the patient the benefits, risks, side-effects, alternatives, likelihood of achieving goals, and potential problems during recovery for the procedure that I have provided informed consent. Date  Time: 10/08/2021  3:01 PM  Pre-Procedure Preparation:  Monitoring: As per clinic protocol. Respiration, ETCO2, SpO2, BP, heart rate and rhythm monitor placed and checked for adequate function Safety Precautions: Patient was assessed for positional comfort and pressure points before starting the procedure. Time-out: I initiated and conducted the "Time-out" before starting the procedure, as per protocol. The patient was asked to participate by confirming the accuracy of the "Time Out" information. Verification of the correct person, site, and procedure were performed and confirmed by me, the nursing staff, and the patient. "Time-out" conducted as per Joint Commission's Universal Protocol (UP.01.01.01). Time: 1521  Description of Procedure:          Area Prepped: Entire             Region DuraPrep (Iodine Povacrylex [0.7% available iodine] and Isopropyl Alcohol, 74% w/w) Safety Precautions: Aspiration looking for blood return was conducted prior to all injections. At no point did we inject any substances, as a needle was being advanced. No attempts were made at seeking any paresthesias. Safe injection practices and needle disposal techniques used. Medications properly checked for expiration dates. SDV (single dose vial) medications used. Description of the Procedure: Protocol guidelines were followed. The patient was placed in position over the fluoroscopy table. The target area was identified and the area prepped in the usual manner. Skin & deeper tissues infiltrated with local anesthetic. Appropriate amount of time allowed to pass for local anesthetics to take effect. The procedure  needles were then advanced to the target area. Proper needle placement secured. Negative aspiration confirmed. Solution injected in intermittent fashion, asking for systemic symptoms every 0.5cc of injectate. The needles were then removed and the area cleansed, making sure to leave some of the prepping  solution back to take advantage of its long term bactericidal properties.  Vitals:   10/08/21 1500  BP: (!) 136/93  Pulse: 88  Temp: (!) 97.3 F (36.3 C)  SpO2: 100%  Weight: 142 lb (64.4 kg)  Height: '5\' 3"'$  (1.6 m)    Start Time: 1521 hrs. End Time:   hrs. Materials:  Needle(s) Type: Epidural needle Gauge: 20G Length: 3.5-in Medication(s): Please see orders for medications and dosing details.  Imaging Guidance:          Type of Imaging Technique: None used Indication(s): N/A Exposure Time: No patient exposure Contrast: None used. Fluoroscopic Guidance: N/A Ultrasound Guidance: N/A Interpretation: N/A  Antibiotic Prophylaxis:   Anti-infectives (From admission, onward)    None      Indication(s): None identified  Post-operative Assessment:  Post-procedure Vital Signs:  Pulse/HCG Rate: 88  Temp:  (!) 97.3 F (36.3 C) Resp:   BP:  (!) 136/93 SpO2: 100 %  EBL: None  Complications: No immediate post-treatment complications observed by team, or reported by patient.  Note: The patient tolerated the entire procedure well. A repeat set of vitals were taken after the procedure and the patient was kept under observation following institutional policy, for this type of procedure. Post-procedural neurological assessment was performed, showing return to baseline, prior to discharge. The patient was provided with post-procedure discharge instructions, including a section on how to identify potential problems. Should any problems arise concerning this procedure, the patient was given instructions to immediately contact us, at any time, without hesitation. In any case, we plan to  contact the patient by telephone for a follow-up status report regarding this interventional procedure.  Comments:  No additional relevant information.  Plan of Care  Orders:  Orders Placed This Encounter  Procedures   TRIGGER POINT INJECTION    Scheduling Instructions:     Area: Upper Back     Side: Midline     Sedation: No Sedation.     Timeframe: Today    Order Specific Question:   Where will this procedure be performed?    Answer:   [facility]   Informed Consent Details: Physician/Practitioner Attestation; Transcribe to consent form and obtain patient signature    Provider Attestation: I, [place] Dossie Arbour, MD, (Pain Management Specialist), the physician/practitioner, attest that I have discussed with the patient the benefits, risks, side effects, alternatives, likelihood of achieving goals and potential problems during recovery for the procedure that I have provided informed consent.    Scheduling Instructions:     Note: Always confirm laterality of pain with Ms. Spoerl, before procedure.     Transcribe to consent form and obtain patient signature.    Order Specific Question:   Physician/Practitioner attestation of informed consent for procedure/surgical case    Answer:   I, the physician/practitioner, attest that I have discussed with the patient the benefits, risks, side effects, alternatives, likelihood of achieving goals and potential problems during recovery for the procedure that I have provided informed consent.    Order Specific Question:   Procedure    Answer:   Myoneural Block (Trigger Point injection)    Order Specific Question:   Physician/Practitioner performing the procedure    Answer:   Taitum Menton A. Dossie Arbour MD    Order Specific Question:   Indication/Reason    Answer:   Musculoskeletal pain/myofascial pain secondary to trigger point   Provide equipment / supplies at bedside    "Block Tray" (Disposable  single use) Needle type:  SpinalRegular Amount/quantity:  1 Size: Short(1.5-inch) Gauge: 25G    Standing Status:   Standing    Number of Occurrences:   1    Order Specific Question:   Specify    Answer:   Block Tray   Chronic Opioid Analgesic:  None MME/day: 0 mg/day.   Medications ordered for procedure: Meds ordered this encounter  Medications   oxyCODONE (OXY IR/ROXICODONE) 5 MG immediate release tablet    Sig: Take 1 tablet (5 mg total) by mouth 2 (two) times daily as needed for severe pain. Must last 30 days    Dispense:  60 tablet    Refill:  0    DO NOT: delete (not duplicate); no partial-fill (will deny script to complete), no refill request (F/U required). DISPENSE: 1 day early if closed on fill date. WARN: No CNS-depressants within 8 hrs of med.   oxyCODONE (OXY IR/ROXICODONE) 5 MG immediate release tablet    Sig: Take 1 tablet (5 mg total) by mouth 2 (two) times daily as needed for severe pain. Must last 30 days    Dispense:  60 tablet    Refill:  0    DO NOT: delete (not duplicate); no partial-fill (will deny script to complete), no refill request (F/U required). DISPENSE: 1 day early if closed on fill date. WARN: No CNS-depressants within 8 hrs of med.   oxyCODONE (OXY IR/ROXICODONE) 5 MG immediate release tablet    Sig: Take 1 tablet (5 mg total) by mouth 2 (two) times daily as needed for severe pain. Must last 30 days    Dispense:  60 tablet    Refill:  0    DO NOT: delete (not duplicate); no partial-fill (will deny script to complete), no refill request (F/U required). DISPENSE: 1 day early if closed on fill date. WARN: No CNS-depressants within 8 hrs of med.   lidocaine (XYLOCAINE) 2 % (with pres) injection 400 mg   pentafluoroprop-tetrafluoroeth (GEBAUERS) aerosol   ropivacaine (PF) 2 mg/mL (0.2%) (NAROPIN) injection 9 mL   triamcinolone acetonide (KENALOG-40) injection 40 mg   Medications administered: We administered lidocaine, pentafluoroprop-tetrafluoroeth, ropivacaine (PF) 2 mg/mL  (0.2%), and triamcinolone acetonide.  See the medical record for exact dosing, route, and time of administration.  Follow-up plan:   Return in about 2 weeks (around 10/22/2021) for Proc-day (T,Th), (VV), (PPE).       Interventional Therapies  Risk  Complexity Considerations:   Estimated body mass index is 26.57 kg/m as calculated from the following:   Height as of this encounter: '5\' 3"'$  (1.6 m).   Weight as of this encounter: 150 lb (68 kg). WNL   Planned  Pending:      Under consideration:      Completed:   Therapeutic left IA shoulder joint inj. x1 (12/30/2019) (100/100/100 x 5 days/0)  Palliative right IA shoulder joint inj. x2 (10/06/2018) (100/100/50/50)  Palliative right AC joint inj. x3 (04/02/2018) (N/A)  Palliative left medial epicondyle elbow inj. x1 (04/02/2018) (N/A)  Diagnostic left GONB x1 (12/25/2017) (N/A)  Palliative right CESI x2 (02/12/2016) (N/A) Therapeutic left C7-T1 cervical ESI x1 (03/18/2016) (100/50/0/0)  Palliative bilateral cervical facet MBB x2 (06/26/2016)  Palliative right cervical facet RFA x3 (04/13/2020) (100/100/75/75)  Palliative left cervical facet RFA x3 (03/14/2020) (100/100/75/75)    Therapeutic  Palliative (PRN) options:   Palliative cervical facet MBB  Palliative cervical facet RFA       Recent Visits No visits were found meeting these conditions. Showing recent visits within past 90 days and meeting  all other requirements Today's Visits Date Type Provider Dept  10/08/21 Office Visit Milinda Pointer, MD [facility]  Showing today's visits and meeting all other requirements Future Appointments Date Type Provider Dept  10/23/21 Appointment Milinda Pointer, MD [facility]  12/31/21 Appointment Milinda Pointer, MD [facility]  Showing future appointments within next 90 days and meeting all other requirements  Disposition: Discharge home  Discharge (Date  Time): 10/08/2021; 1533 hrs.    Primary Care Physician: Tanya Ramsay, MD Location: [facility] Note by: Tanya Cola, MD Date: 10/08/2021; Time: 3:36 PM  Disclaimer:  Medicine is not an Chief Strategy Officer. The only guarantee in medicine is that nothing is guaranteed. It is important to note that the decision to proceed with this intervention was based on the information collected from the patient. The Data and conclusions were drawn from the patient's questionnaire, the interview, and the physical examination. Because the information was provided in large part by the patient, it cannot be guaranteed that it has not been purposely or unconsciously manipulated. Every effort has been made to obtain as much relevant data as possible for this evaluation. It is important to note that the conclusions that lead to this procedure are derived in large part from the available data. Always take into account that the treatment will also be dependent on availability of resources and existing treatment guidelines, considered by other Pain Management Practitioners as being common knowledge and practice, at the time of the intervention. For Medico-Legal purposes, it is also important to point out that variation in procedural techniques and pharmacological choices are the acceptable norm. The indications, contraindications, technique, and results of the above procedure should only be interpreted and judged by a Board-Certified Interventional Pain Specialist with extensive familiarity and expertise in the same exact procedure and technique.

## 2021-10-10 ENCOUNTER — Other Ambulatory Visit: Payer: Self-pay | Admitting: Psychiatry

## 2021-10-10 DIAGNOSIS — F5101 Primary insomnia: Secondary | ICD-10-CM

## 2021-10-12 ENCOUNTER — Encounter: Payer: Self-pay | Admitting: Pain Medicine

## 2021-10-12 ENCOUNTER — Telehealth: Payer: Self-pay

## 2021-10-12 NOTE — Telephone Encounter (Signed)
received notice that aripiprazole '2mg'$  was approve from 10-04-21 to 10-04-22

## 2021-10-15 ENCOUNTER — Encounter: Payer: Self-pay | Admitting: Psychiatry

## 2021-10-15 ENCOUNTER — Telehealth: Payer: Self-pay | Admitting: Pain Medicine

## 2021-10-15 ENCOUNTER — Ambulatory Visit: Payer: Medicaid Other | Admitting: Psychiatry

## 2021-10-15 VITALS — BP 134/81 | HR 86 | Temp 98.5°F | Wt 138.6 lb

## 2021-10-15 DIAGNOSIS — F17200 Nicotine dependence, unspecified, uncomplicated: Secondary | ICD-10-CM | POA: Diagnosis not present

## 2021-10-15 DIAGNOSIS — F5101 Primary insomnia: Secondary | ICD-10-CM | POA: Diagnosis not present

## 2021-10-15 DIAGNOSIS — F411 Generalized anxiety disorder: Secondary | ICD-10-CM

## 2021-10-15 DIAGNOSIS — F3341 Major depressive disorder, recurrent, in partial remission: Secondary | ICD-10-CM

## 2021-10-15 MED ORDER — ESZOPICLONE 2 MG PO TABS: ORAL_TABLET | ORAL | 1 refills | Status: DC

## 2021-10-15 MED ORDER — DULOXETINE HCL 30 MG PO CPEP: 30.0000 mg | ORAL_CAPSULE | Freq: Two times a day (BID) | ORAL | 0 refills | Status: DC

## 2021-10-15 MED ORDER — HYDROXYZINE PAMOATE 25 MG PO CAPS: 25.0000 mg | ORAL_CAPSULE | Freq: Every day | ORAL | 1 refills | Status: DC | PRN

## 2021-10-15 NOTE — Telephone Encounter (Signed)
PT stated that [facility] is saying they need an pre autho for her prescription. PT sated that she had spoke with someone last week and was told that the autho was send in. PT will like to be call when the [employer] has been called in. Thanks

## 2021-10-15 NOTE — Progress Notes (Signed)
[facility] MD OP Progress Note  10/15/2021 1:00 PM Tanya [name]  MRN:  [id number]  Chief Complaint:  Chief Complaint  Patient presents with   Follow-up   Medication Problem   Medication Refill   Insomnia   HPI: Tanya [name] is a 61 year old Caucasian female on SSD, separated from husband, lives in [name], has a history of GAD, MDD, insomnia, chronic pain, fibromyalgia, osteoarthritis, SLE was evaluated in office today.  Patient's last visit was on 10/03/2021.  Patient today returns reporting she stopped taking the Abilify since she had ' rage ' when she tried it.  Patient reports she decided to go up on the Cymbalta herself to 30 mg twice a day.  Since doing so she has felt much better.  Patient also reports she has made some situational changes.  She reports she had a talk with her son who has mental health and substance abuse problems recently and he left the home.  That has been a relief as well since she feels calmer since he left.  Patient reports she does have good support system from her daughter.  Patient currently continues to have anxiety about her son, her grandson.  Patient is also in pain which does have an impact on her mood.  Currently on pain management.  Reports sleep is restless.  The Lunesta does help her to fall asleep however she wakes up intermittently at least twice, unable to elaborate what wakes her up.  Patient currently denies any suicidality, homicidality or perceptual disturbances.  Patient denies any other concerns today.  Visit Diagnosis:    ICD-10-CM   1. GAD (generalized anxiety disorder)  F41.1 DULoxetine (CYMBALTA) 30 MG capsule    hydrOXYzine (VISTARIL) 25 MG capsule    2. MDD (major depressive disorder), recurrent, in partial remission ([place])  F33.41     3. Primary insomnia  F51.01 eszopiclone (LUNESTA) 2 MG TABS tablet    4. Tobacco use disorder  F17.200       Past Psychiatric History: Reviewed past psychiatric history from progress note  on 02/06/2021.  Past trials of Cymbalta-higher dosage did not tolerate, trazodone-side effects, Wellbutrin-side effects, Paxil-side effects, Prozac-side effect, Valium, doxepin, Trintellix, mirtazapine.  Past Medical History:  Past Medical History:  Diagnosis Date   Anxiety    Bladder infection    8/18   Chronic lower back pain    Chronic neck pain    Collagen vascular disease ([place])    COVID-19    Depression    Diverticulitis    GERD (gastroesophageal reflux disease)    Hyperlipidemia    Lupus (HCC)    Overactive bladder     Past Surgical History:  Procedure Laterality Date   ABDOMINAL HYSTERECTOMY     COLONOSCOPY     COLONOSCOPY WITH PROPOFOL N/A 07/03/2017   Procedure: COLONOSCOPY WITH PROPOFOL;  Surgeon: [name], MD;  Location[facility] ENDOSCOPY;  Service: Endoscopy;  Laterality: N/A;   COLONOSCOPY WITH PROPOFOL N/A 10/21/2017   Procedure: COLONOSCOPY WITH PROPOFOL;  Surgeon: [name], MD;  Location: [facility] ENDOSCOPY;  Service: Endoscopy;  Laterality: N/A;   ESOPHAGOGASTRODUODENOSCOPY N/A 10/21/2017   Procedure: ESOPHAGOGASTRODUODENOSCOPY (EGD);  Surgeon: [name], MD;  Location: [facility] ENDOSCOPY;  Service: Endoscopy;  Laterality: N/A;   TONSILLECTOMY      Family Psychiatric History: Reviewed family psychiatric history from progress note on 02/06/2021.  Family History:  Family History  Problem Relation Age of Onset   Depression Mother    Alcohol abuse Mother  Cancer Mother    Hypertension Mother    Emphysema Mother    Stroke Mother    Glaucoma Father    Heart disease Father    Diabetes Brother    Drug abuse Son     Social History: Reviewed social history from progress note on 02/06/2021. Social History   Socioeconomic History   Marital status: Married    Spouse name: Not on file   Number of children: Not on file   Years of education: Not on file   Highest education level: Not on file  Occupational History   Not on file  Tobacco Use    Smoking status: Every Day    Packs/day: 1.00    Years: 35.00    Total pack years: 35.00    Types: Cigarettes    Passive exposure: Past   Smokeless tobacco: Never   Tobacco comments:    1ppd - 03/01/2021  Vaping Use   Vaping Use: Never used  Substance and Sexual Activity   Alcohol use: No   Drug use: No   Sexual activity: Not Currently  Other Topics Concern   Not on file  Social History Narrative   Not on file   Social Determinants of Health   Financial Resource Strain: Not on file  Food Insecurity: Not on file  Transportation Needs: Not on file  Physical Activity: Not on file  Stress: Not on file  Social Connections: Not on file    Allergies:  Allergies  Allergen Reactions   Pregabalin Other (See Comments)    unknown   Trazodone Other (See Comments)    Agitation   Bupropion Anxiety   Methocarbamol Other (See Comments)    Agitation and stiffness   Tramadol Other (See Comments)    Nausea And Vomiting Reports she feels "high"    Metabolic Disorder Labs: No results found for: "HGBA1C", "MPG" No results found for: "PROLACTIN" No results found for: "CHOL", "TRIG", "HDL", "CHOLHDL", "VLDL", "LDLCALC" Lab Results  Component Value Date   TSH 3.347 02/07/2021    Therapeutic Level Labs: No results found for: "LITHIUM" No results found for: "VALPROATE" No results found for: "CBMZ"  Current Medications: Current Outpatient Medications  Medication Sig Dispense Refill   atorvastatin (LIPITOR) 80 MG tablet Take 80 mg by mouth every evening.     Baclofen 5 MG TABS Take by mouth.     gabapentin (NEURONTIN) 300 MG capsule Take 1 capsule (300 mg total) by mouth 3 (three) times daily. 90 capsule 2   hydroxychloroquine (PLAQUENIL) 200 MG tablet Take 200 mg by mouth in the morning.     hydrOXYzine (VISTARIL) 25 MG capsule Take 1 capsule (25 mg total) by mouth daily as needed. For anxiety, sleep 30 capsule 1   [START ON 11/07/2021] oxyCODONE (OXY IR/ROXICODONE) 5 MG immediate  release tablet Take 1 tablet (5 mg total) by mouth 2 (two) times daily as needed for severe pain. Must last 30 days 60 tablet 0   [START ON 12/07/2021] oxyCODONE (OXY IR/ROXICODONE) 5 MG immediate release tablet Take 1 tablet (5 mg total) by mouth 2 (two) times daily as needed for severe pain. Must last 30 days 60 tablet 0   VENTOLIN HFA 108 (90 Base) MCG/ACT inhaler INHALE 2 PUFFS INTO THE LUNGS EVERY 6 HOURS AS NEEDED FOR WHEEZING OR SHORTNESS OF BREATH 18 g 2   DULoxetine (CYMBALTA) 30 MG capsule Take 1 capsule (30 mg total) by mouth 2 (two) times daily. 180 capsule 0   [START ON 11/07/2021]  eszopiclone (LUNESTA) 2 MG TABS tablet TAKE 1 TABLET BY MOUTH AT BEDTIME AS NEEDED FOR SLEEP. TAKE IMMEDIATELY BEFORE BED 30 tablet 1   nicotine (NICODERM CQ - DOSED IN MG/24 HOURS) 14 mg/24hr patch Place 1 patch (14 mg total) onto the skin daily. (Patient not taking: Reported on 10/15/2021) 28 patch 0   No current facility-administered medications for this visit.   Facility-Administered Medications Ordered in Other Visits  Medication Dose Route Frequency Provider Last Rate Last Admin   albuterol (PROVENTIL) (2.5 MG/3ML) 0.083% nebulizer solution 2.5 mg  2.5 mg Nebulization Once Tyler Pita, MD         Musculoskeletal: Strength & Muscle Tone: within normal limits Gait & Station: normal Patient leans: N/A  Psychiatric Specialty Exam: Review of Systems  Psychiatric/Behavioral:  Positive for dysphoric mood and sleep disturbance. The patient is nervous/anxious.   All other systems reviewed and are negative.   Blood pressure 134/81, pulse 86, temperature 98.5 F (36.9 C), temperature source Temporal, weight 138 lb 9.6 oz (62.9 kg).Body mass index is 24.55 kg/m.  General Appearance: Casual  Eye Contact:  Fair  Speech:  Clear and Coherent  Volume:  Normal  Mood:  Anxious and Dysphoric improving  Affect:  Appropriate  Thought Process:  Goal Directed and Descriptions of Associations: Intact   Orientation:  Full (Time, Place, and Person)  Thought Content: Logical   Suicidal Thoughts:  No  Homicidal Thoughts:  No  Memory:  Immediate;   Fair Recent;   Fair Remote;   Fair  Judgement:  Fair  Insight:  Fair  Psychomotor Activity:  Normal  Concentration:  Concentration: Fair and Attention Span: Fair  Recall:  [employer] of Knowledge: Fair  Language: Fair  Akathisia:  No  Handed:  Right  AIMS (if indicated): not done  Assets:  Communication Skills Desire for Improvement Housing Social Support  ADL's:  Intact  Cognition: WNL  Sleep:  Poor   Screenings: [place] Office Visit from 06/22/2021 in [place] Total Score 0      [place] Visit from 10/15/2021 in [facility] Office Visit from 10/03/2021 in [facility] Counselor from 10/02/2021 in [place] Office Visit from 07/19/2021 in [place] [facility] Office Visit from 06/22/2021 in [facility]  Total GAD-7 Score '7 17 20 1 16      '$ PHQ2-9    [place] Office Visit from 10/15/2021 in [facility] Office Visit from 10/03/2021 in [facility] Counselor from 10/02/2021 in [facility] [place] Office Visit from 07/19/2021 in [facility] Office Visit from 06/25/2021 in [facility] [place]  PHQ-2 Total Score '2 6 6 '$ 0 0  PHQ-9 Total Score '5 18 21 '$ 0 --      [facility] Office Visit from 10/15/2021 in [place] Office Visit from 10/03/2021 in [facility] Counselor from 10/02/2021 in [place] No Risk Low Risk Error: Q3, 4, or 5 should not be populated when Q2 is  No        Assessment and Plan: YARLIN BREISCH is a 61 year old Caucasian female on disability, separated, lives in [place], has a history of depression, chronic pain, anxiety, GERD, SLE, fibromyalgia, chronic discoid lupus was evaluated in office today.  Patient is  currently improving, will benefit from the following plan.  Plan GAD-improving Increase Cymbalta to 30 mg p.o. twice daily-patient is already on the higher dosage. Continue CBT  MDD in partial remission Discontinue Abilify for side effects and noncompliance Increase Cymbalta to 30 mg p.o. twice daily.  Primary insomnia-unstable Start hydroxyzine 25 mg p.o. daily as needed-could use it if sleep is interrupted.  Advised not to combine it with oxycodone, Lunesta. Continue Lunesta 2 mg p.o. nightly. Reviewed [place] PMP aware  Tobacco use disorder-unstable Will monitor closely  Provided patient education-advised not to increase the dosages of her medications without consulting with her providers first.  Discussed with her that this could even result in dismissal from the practice.  Follow-up in clinic in 4 weeks or sooner if needed.   This note was generated in part or whole with voice recognition software. Voice recognition is usually quite accurate but there are transcription errors that can and very often do occur. I apologize for any typographical errors that were not detected and corrected.    Ursula Alert, MD 10/15/2021, 1:00 PM

## 2021-10-15 NOTE — Patient Instructions (Signed)
Hydroxyzine Capsules or Tablets What is this medication? HYDROXYZINE (hye DROX i zeen) treats the symptoms of allergies and allergic reactions. It may also be used to treat anxiety or cause drowsiness before a procedure. It works by blocking histamine, a substance released by the body during an allergic reaction. It belongs to a group of medications called antihistamines. This medicine may be used for other purposes; ask your health care provider or pharmacist if you have questions. COMMON BRAND NAME(S): ANX, Atarax, Rezine, Vistaril What should I tell my care team before I take this medication? They need to know if you have any of these conditions: Glaucoma Heart disease History of irregular heartbeat Kidney disease Liver disease Lung or breathing disease, like asthma Stomach or intestine problems Thyroid disease Trouble passing urine An unusual or allergic reaction to hydroxyzine, cetirizine, other medications, foods, dyes or preservatives Pregnant or trying to get pregnant Breast-feeding How should I use this medication? Take this medication by mouth with a full glass of water. Follow the directions on the prescription label. You may take this medication with food or on an empty stomach. Take your medication at regular intervals. Do not take your medication more often than directed. Talk to your care team regarding the use of this medication in children. Special care may be needed. While this medication may be prescribed for children as young as 6 years of age for selected conditions, precautions do apply. Patients over 65 years old may have a stronger reaction and need a smaller dose. Overdosage: If you think you have taken too much of this medicine contact a poison control center or emergency room at once. NOTE: This medicine is only for you. Do not share this medicine with others. What if I miss a dose? If you miss a dose, take it as soon as you can. If it is almost time for your next  dose, take only that dose. Do not take double or extra doses. What may interact with this medication? Do not take this medication with any of the following: Cisapride Dronedarone Pimozide Thioridazine This medication may also interact with the following: Alcohol Antihistamines for allergy, cough, and cold Atropine Barbiturate medications for sleep or seizures, like phenobarbital Certain antibiotics like erythromycin or clarithromycin Certain medications for anxiety or sleep Certain medications for bladder problems like oxybutynin, tolterodine Certain medications for depression or psychotic disturbances Certain medications for irregular heart beat Certain medications for Parkinson's disease like benztropine, trihexyphenidyl Certain medications for seizures like phenobarbital, primidone Certain medications for stomach problems like dicyclomine, hyoscyamine Certain medications for travel sickness like scopolamine Ipratropium Narcotic medications for pain Other medications that prolong the QT interval (which can cause an abnormal heart rhythm) like dofetilide This list may not describe all possible interactions. Give your health care provider a list of all the medicines, herbs, non-prescription drugs, or dietary supplements you use. Also tell them if you smoke, drink alcohol, or use illegal drugs. Some items may interact with your medicine. What should I watch for while using this medication? Tell your care team if your symptoms do not improve. You may get drowsy or dizzy. Do not drive, use machinery, or do anything that needs mental alertness until you know how this medication affects you. Do not stand or sit up quickly, especially if you are an older patient. This reduces the risk of dizzy or fainting spells. Alcohol may interfere with the effect of this medication. Avoid alcoholic drinks. Your mouth may get dry. Chewing sugarless gum or sucking hard   candy, and drinking plenty of water may  help. Contact your care team if the problem does not go away or is severe. This medication may cause dry eyes and blurred vision. If you wear contact lenses you may feel some discomfort. Lubricating drops may help. See your eye care specialist if the problem does not go away or is severe. If you are receiving skin tests for allergies, tell your care team you are using this medication. What side effects may I notice from receiving this medication? Side effects that you should report to your care team as soon as possible: Allergic reactions--skin rash, itching, hives, swelling of the face, lips, tongue, or throat Heart rhythm changes--fast or irregular heartbeat, dizziness, feeling faint or lightheaded, chest pain, trouble breathing Side effects that usually do not require medical attention (report to your care team if they continue or are bothersome): Confusion Drowsiness Dry mouth Hallucinations Headache This list may not describe all possible side effects. Call your doctor for medical advice about side effects. You may report side effects to FDA at 1-800-FDA-1088. Where should I keep my medication? Keep out of the reach of children and pets. Store at room temperature between 15 and 30 degrees C (59 and 86 degrees F). Keep container tightly closed. Throw away any unused medication after the expiration date. NOTE: This sheet is a summary. It may not cover all possible information. If you have questions about this medicine, talk to your doctor, pharmacist, or health care provider.  2023 Elsevier/Gold Standard (2020-02-10 00:00:00)

## 2021-10-22 ENCOUNTER — Ambulatory Visit: Payer: Medicaid Other | Admitting: Psychiatry

## 2021-10-22 ENCOUNTER — Encounter: Payer: Self-pay | Admitting: Pain Medicine

## 2021-10-23 ENCOUNTER — Ambulatory Visit: Payer: Medicaid Other | Attending: Pain Medicine | Admitting: Pain Medicine

## 2021-10-23 DIAGNOSIS — M7918 Myalgia, other site: Secondary | ICD-10-CM | POA: Diagnosis not present

## 2021-10-23 DIAGNOSIS — M542 Cervicalgia: Secondary | ICD-10-CM | POA: Diagnosis not present

## 2021-10-23 DIAGNOSIS — G8929 Other chronic pain: Secondary | ICD-10-CM

## 2021-10-23 NOTE — Progress Notes (Signed)
Patient: Tanya [name]  Service Category: E/M  Provider: Gaspar Cola, MD  DOB: 07-05-1960  DOS: 10/23/2021  Location: Office  MRN: [id number]  Setting: Ambulatory outpatient  Referring Provider: Leonel Ramsay, MD  Type: Established Patient  Specialty: Interventional Pain Management  PCP: Leonel Ramsay, MD  Location: Remote location  Delivery: TeleHealth     Virtual Encounter - Pain Management PROVIDER NOTE: Information contained herein reflects review and annotations entered in association with encounter. Interpretation of such information and data should be left to medically-trained personnel. Information provided to patient can be located elsewhere in the medical record under "Patient Instructions". Document created using STT-dictation technology, any transcriptional errors that may result from process are unintentional.    Contact & Pharmacy Preferred: [phone] Home: [phone] (home) Mobile: [phone] (mobile) E-mail: [email or web address]  [place], [place] [address] Phone: [phone] Fax: [phone]   Pre-screening  Tanya [name] offered "in-person" vs "virtual" encounter. She indicated preferring virtual for this encounter.   Reason COVID-19*  Social distancing based on CDC and AMA recommendations.   I contacted Tanya [name] on 10/23/2021 via telephone.      I clearly identified myself as Gaspar Cola, MD. I verified that I was speaking with the correct person using two identifiers (Name: RENNIE HACK, and date of birth: 02/07/1960).  Consent I sought verbal advanced consent from Tanya [name] for virtual visit interactions. I informed Tanya [name] of possible security and privacy concerns, risks, and limitations associated with providing "not-in-person" medical evaluation and management services. I also informed Tanya [name] of the availability of "in-person"  appointments. Finally, I informed her that there would be a charge for the virtual visit and that she could be  personally, fully or partially, financially responsible for it. Tanya [name] expressed understanding and agreed to proceed.   Historic Elements   Tanya [name] is a 61 y.o. year old, female patient evaluated today after our last contact on 10/15/2021. Tanya [name]  has a past medical history of Anxiety, Bladder infection, Chronic lower back pain, Chronic neck pain, Collagen vascular disease ([place]), COVID-19, Depression, Diverticulitis, GERD (gastroesophageal reflux disease), Hyperlipidemia, Lupus ([place]), and Overactive bladder. She also  has a past surgical history that includes Abdominal hysterectomy; Colonoscopy; Colonoscopy with propofol (N/A, 07/03/2017); Tonsillectomy; Esophagogastroduodenoscopy (N/A, 10/21/2017); and Colonoscopy with propofol (N/A, 10/21/2017). Tanya [name] has a current medication list which includes the following prescription(s): atorvastatin, clobetasol cream, duloxetine, [START ON 11/07/2021] eszopiclone, gabapentin, hydroxychloroquine, hydroxyzine, [START ON 11/07/2021] oxycodone, [START ON 12/07/2021] oxycodone, and ventolin hfa, and the following Facility-Administered Medications: albuterol. She  reports that she has been smoking cigarettes. She has a 35.00 pack-year smoking history. She has been exposed to tobacco smoke. She has never used smokeless tobacco. She reports that she does not drink alcohol and does not use drugs. Tanya [name] is allergic to pregabalin, trazodone, bupropion, methocarbamol, and tramadol.   HPI  Today, she is being contacted for a post-procedure assessment.  The patient refers having an ongoing 100% relief of the pain from the trigger point.  She is very happy.  She indicates that the swelling went down and she no longer has any pain in the area.  Post-procedure evaluation    Procedure:          Anesthesia, Analgesia, Anxiolysis:   Type: Cervical thoracic Trigger Point Injection (1-2 muscle groups)          CPT:  20552 Primary Purpose: Therapeutic Region: Posterior Cervicothoracic Level:  C7 level Target Area: Cervical thoracic Trigger Point, C7 area interspinous bursa Approach: Percutaneous, ipsilateral approach. Laterality: Midline Paraspinal  Type: Local Anesthesia Local Anesthetic: Lidocaine 1-2% Sedation: None  Indication(s):  Analgesia Route: Infiltration (/IM) IV Access: N/A   Position: Sitting   1. Cervicothoracic interspinous bursitis   2. Acute upper back pain (Midline)   3. Trigger point with neck pain   4. Chronic pain syndrome   5. Chronic neck pain (1ry area of Pain) (Bilateral) (R>L)   6. Chronic upper back pain (2ry area of Pain) (Bilateral) (R>L)    NAS-11 Pain score:   Pre-procedure: 8 /10   Post-procedure: 8 /10      Effectiveness:  Initial hour after procedure: 100 %. Subsequent 4-6 hours post-procedure: 100 %. Analgesia past initial 6 hours: 100 %. Ongoing improvement:  Analgesic: The patient indicates having an ongoing 100% relief of the pain in the posterior cervical thoracic area around the C7 spinous process. Function: Tanya [name] reports improvement in function ROM: Tanya [name] reports improvement in ROM  Pharmacotherapy Assessment   Opioid Analgesic: None MME/day: 0 mg/day.   Monitoring: [place] PMP: PDMP reviewed during this encounter.       Pharmacotherapy: No side-effects or adverse reactions reported. Compliance: No problems identified. Effectiveness: Clinically acceptable. Plan: Refer to "POC". UDS:  Summary  Date Value Ref Range Status  01/31/2021 Note  Final    Comment:    ==================================================================== [employer] Select 13 (MW) ==================================================================== Test                             Result       Flag       Units  Drug Present and Declared for Prescription  Verification   Oxazepam                       187          EXPECTED   ng/mg creat    Oxazepam may be administered as a scheduled prescription medication;    it is also an expected metabolite of other benzodiazepine drugs,    including diazepam, chlordiazepoxide, prazepam, clorazepate,    halazepam, and temazepam.    Oxycodone                      972          EXPECTED   ng/mg creat   Oxymorphone                    197          EXPECTED   ng/mg creat   Noroxycodone                   500          EXPECTED   ng/mg creat    Sources of oxycodone include scheduled prescription medications.    Oxymorphone and noroxycodone are expected metabolites of oxycodone.    Oxymorphone is also available as a scheduled prescription medication.  ==================================================================== Test                      Result    Flag   Units      Ref Range   Creatinine              39  mg/dL      >=20 ==================================================================== Declared Medications:  The flagging and interpretation on this report are based on the  following declared medications.  Unexpected results may arise from  inaccuracies in the declared medications.   **Note: The testing scope of this panel includes these medications:   Diazepam (Valium)  Oxycodone (Roxicodone)   **Note: The testing scope of this panel does not include the  following reported medications:   Albuterol (Ventolin HFA)  Atorvastatin (Lipitor)  Baclofen (Lioresal)  Buspirone (Buspar)  Dextromethorphan  Duloxetine (Cymbalta)  Gabapentin (Neurontin)  Promethazine ==================================================================== For clinical consultation, please call [phone]. ====================================================================    No results found for: "CBDTHCR", "D8THCCBX", "D9THCCBX"   Laboratory Chemistry Profile   Renal Lab Results  Component Value  Date   BUN 7 06/09/2019   CREATININE 0.72 06/09/2019   BCR 15 09/03/2017   GFRAA >60 06/09/2019   GFRNONAA >60 06/09/2019    Hepatic Lab Results  Component Value Date   AST 26 06/09/2019   ALT 20 06/09/2019   ALBUMIN 4.1 06/09/2019   ALKPHOS 29 (L) 06/09/2019   LIPASE 35 06/09/2019    Electrolytes Lab Results  Component Value Date   NA 135 06/09/2019   K 4.2 06/09/2019   CL 102 06/09/2019   CALCIUM 8.4 (L) 06/09/2019   MG 1.9 09/03/2017    Bone Lab Results  Component Value Date   25OHVITD1 25 (L) 09/03/2017   25OHVITD2 2.9 09/03/2017   25OHVITD3 22 09/03/2017    Inflammation (CRP: Acute Phase) (ESR: Chronic Phase) Lab Results  Component Value Date   CRP 2 09/03/2017   ESRSEDRATE 21 09/03/2017         Note: Above Lab results reviewed.  Imaging  DG Chest 2 View CLINICAL DATA:  cough and chest congestion for over a week  EXAM: CHEST - 2 VIEW  COMPARISON:  Radiograph 03/14/2021, chest CT 06/07/2021  FINDINGS: Unchanged cardiomediastinal silhouette. Persistent left upper low pulmonary nodule as described on prior chest CT. There is no new airspace disease. There is no pleural effusion. No pneumothorax. No acute osseous abnormality.  IMPRESSION: No new airspace disease. Persistent left upper lobe pulmonary nodule as described on prior chest CT.  Electronically Signed   By: Maurine Simmering M.D.   On: 06/30/2021 11:23  Assessment  The primary encounter diagnosis was Trigger point with neck pain. Diagnoses of Chronic musculoskeletal pain and Cervicalgia were also pertinent to this visit.  Plan of Care  Problem-specific:  No problem-specific Assessment & Plan notes found for this encounter.  Tanya [name] has a current medication list which includes the following long-term medication(s): duloxetine, [START ON 11/07/2021] eszopiclone, gabapentin, [START ON 11/07/2021] oxycodone, [START ON 12/07/2021] oxycodone, and ventolin hfa.  Pharmacotherapy  (Medications Ordered): No orders of the defined types were placed in this encounter.  Orders:  No orders of the defined types were placed in this encounter.  Follow-up plan:   No follow-ups on file.     Interventional Therapies  Risk  Complexity Considerations:   Estimated body mass index is 26.57 kg/m as calculated from the following:   Height as of this encounter: 5' 3"  (1.6 m).   Weight as of this encounter: 150 lb (68 kg). WNL   Planned  Pending:      Under consideration:      Completed:   Therapeutic left IA shoulder joint inj. x1 (12/30/2019) (100/100/100 x 5 days/0)  Palliative right IA shoulder joint inj. x2 (10/06/2018) (100/100/50/50)  Palliative  right AC joint inj. x3 (04/02/2018) (N/A)  Palliative left medial epicondyle elbow inj. x1 (04/02/2018) (N/A)  Diagnostic left GONB x1 (12/25/2017) (N/A)  Palliative right CESI x2 (02/12/2016) (N/A) Therapeutic left C7-T1 cervical ESI x1 (03/18/2016) (100/50/0/0)  Palliative bilateral cervical facet MBB x2 (06/26/2016)  Palliative right cervical facet RFA x3 (04/13/2020) (100/100/75/75)  Palliative left cervical facet RFA x3 (03/14/2020) (100/100/75/75)  Diagnostic/therapeutic midline C7-T1 TPI/MNB x1 (10/08/2021) (100/100/100/100)    Therapeutic  Palliative (PRN) options:   Palliative cervical facet MBB  Palliative cervical facet RFA      Recent Visits Date Type Provider Dept  10/08/21 Office Visit Milinda Pointer, MD [facility]  Showing recent visits within past 90 days and meeting all other requirements Today's Visits Date Type Provider Dept  10/23/21 Office Visit Milinda Pointer, MD [facility]  Showing today's visits and meeting all other requirements Future Appointments Date Type Provider Dept  12/31/21 Appointment Milinda Pointer, MD [facility]  Showing future appointments within next 90 days and meeting all other requirements  I discussed the assessment and  treatment plan with the patient. The patient was provided an opportunity to ask questions and all were answered. The patient agreed with the plan and demonstrated an understanding of the instructions.  Patient advised to call back or seek an in-person evaluation if the symptoms or condition worsens.  Duration of encounter: 12 minutes.  Note by: Gaspar Cola, MD Date: 10/23/2021; Time: 12:28 PM

## 2021-11-19 ENCOUNTER — Telehealth (HOSPITAL_COMMUNITY): Payer: Self-pay | Admitting: Licensed Clinical Social Worker

## 2021-11-19 ENCOUNTER — Ambulatory Visit (INDEPENDENT_AMBULATORY_CARE_PROVIDER_SITE_OTHER): Payer: Medicaid Other | Admitting: Licensed Clinical Social Worker

## 2021-11-19 DIAGNOSIS — Z91199 Patient's noncompliance with other medical treatment and regimen due to unspecified reason: Secondary | ICD-10-CM

## 2021-11-19 NOTE — Progress Notes (Signed)
LCSW counselor attempted to connect with patient for scheduled appointment via MyChart video text request x 2 and email request with no response; also attempted to connect via phone without success. LCSW attempted to leave message unsuccessfully--phone message stated "caller is currently not accepting calls at this time" and disconnected.   Attempt 1: Text and email: 1:05p  Attempt 2: Text and email: 1:12p   Attempt 3: phone call: 1:20p --could not leave message  Per [place], after multiple attempts to reach pt unsuccessfully at appointed time--visit will be coded as no show

## 2021-11-19 NOTE — Telephone Encounter (Signed)
LCSW counselor attempted to connect with patient for scheduled appointment via MyChart video text request x 2 and email request with no response; also attempted to connect via phone without success. LCSW attempted to leave message unsuccessfully--phone message stated "caller is currently not accepting calls at this time" and disconnected.   Attempt 1: Text and email: 1:05p  Attempt 2: Text and email: 1:12p   Attempt 3: phone call: 1:20p --could not leave message  Per [place], after multiple attempts to reach pt unsuccessfully at appointed time--visit will be coded as no show

## 2021-11-21 ENCOUNTER — Ambulatory Visit: Payer: Medicaid Other | Admitting: Psychiatry

## 2021-11-22 ENCOUNTER — Ambulatory Visit
Admission: EM | Admit: 2021-11-22 | Discharge: 2021-11-22 | Disposition: A | Discharge status: Home / Self Care | Payer: Medicaid Other | Attending: Emergency Medicine | Admitting: Emergency Medicine

## 2021-11-22 DIAGNOSIS — H60392 Other infective otitis externa, left ear: Secondary | ICD-10-CM | POA: Diagnosis not present

## 2021-11-22 MED ORDER — CIPROFLOXACIN-DEXAMETHASONE 0.3-0.1 % OT SUSP: 4.0000 [drp] | Freq: Two times a day (BID) | OTIC | 0 refills | Status: DC

## 2021-11-22 MED ORDER — AMOXICILLIN 500 MG PO CAPS: 500.0000 mg | ORAL_CAPSULE | Freq: Two times a day (BID) | ORAL | 0 refills | Status: AC

## 2021-11-22 NOTE — ED Provider Notes (Signed)
[facility]    CSN: [id number] Arrival date & time: 11/22/21  1518      History   Chief Complaint Chief Complaint  Patient presents with   Otalgia    left   Facial Swelling    HPI Tanya [name] is a 61 y.o. female.   Presents with left ear pain and swelling beginning 4 days ago pain is worsening and has begun to radiate to the left cheek and the left side of neck.  Prior to symptoms starting patient clean the ears using a Bobby pin is unsure if she scratched the skin inside.  Feels as if there is fluid present but unable to drain.  Associated itching and subjective fevers.  Has attempted use of peroxide which has been ineffective.  Denies nasal congestion, rhinorrhea, sinus pain or pressure.  Past Medical History:  Diagnosis Date   Anxiety    Bladder infection    8/18   Chronic lower back pain    Chronic neck pain    Collagen vascular disease ([place])    COVID-19    Depression    Diverticulitis    GERD (gastroesophageal reflux disease)    Hyperlipidemia    Lupus ([facility])    Overactive bladder     Patient Active Problem List   Diagnosis Date Noted   Trigger point with neck pain 10/08/2021   Acute upper back pain (Midline) 10/08/2021   Cervicothoracic interspinous bursitis 10/08/2021   Prediabetes 08/13/2021   MDD (major depressive disorder), recurrent, in full remission ([place]) 07/19/2021   Moderate episode of recurrent major depressive disorder ([place]) 03/23/2021   Tobacco use disorder 03/23/2021   Insomnia 02/06/2021   At risk for prolonged QT interval syndrome 02/06/2021   Psychiatric illness 11/01/2020   Chronic use of opiate for therapeutic purpose 06/25/2020   Arthropathy of cervical facet joint (Bilateral) 04/13/2020   Cervical spine pain 04/13/2020   Abnormal MRI, cervical spine (09/20/2015) 04/13/2020   Cervical central spinal stenosis (Multilevel) 04/13/2020   Cervical foraminal stenosis (Right: C3-4) (Bilateral: C4-5, C5-6) 04/13/2020   DDD  (degenerative disc disease), cervical 03/14/2020   Chronic shoulder pain (Left) 09/06/2019   Osteoarthritis of shoulder (Left) 09/06/2019   Osteoarthritis of shoulders (Bilateral) (R>L) 10/25/2018   Osteoarthritis of AC (acromioclavicular) joint (Right) 04/02/2018   Chronic acromioclavicular joint pain (Right) 04/02/2018   Chronic shoulder pain (Right) 04/02/2018   Epicondylitis elbow, medial (Left) 04/02/2018   Chronic elbow pain (Left) 03/25/2018   Occipital headache (Left) 12/10/2017   Neurogenic pain 12/10/2017   Cervico-occipital neuralgia (Left) 12/10/2017   Pharmacologic therapy 09/01/2017   Disorder of skeletal system 09/01/2017   Problems influencing health status 09/01/2017   Spondylosis without myelopathy or radiculopathy, cervical region 08/05/2017   Cervicalgia 08/05/2017   Rash 05/13/2017   Acute postoperative pain 06/26/2016   Cervical spondylosis 04/15/2016   Cervical facet syndrome (Bilateral) (R>L) 04/15/2016   Shoulder radicular pain (Bilateral) (R>L) 04/15/2016   Chronic musculoskeletal pain 03/07/2016   Vitamin D insufficiency 12/19/2015   Anxiety 11/23/2015   Chronic low back pain (3ry area of Pain) (Bilateral) (R>L) w/o sciatica 11/23/2015   Chronic neck pain (1ry area of Pain) (Bilateral) (R>L) 11/23/2015   Long term prescription benzodiazepine use 11/23/2015   Long term current use of opiate analgesic 11/23/2015   Long term prescription opiate use 11/23/2015   Opiate use (10 MME/Day) 11/23/2015   Chronic pain syndrome 11/23/2015   Chronic upper extremity pain (Right) 11/23/2015   Chronic cervical radicular pain (Right)  11/23/2015   Chronic shoulder pain (Bilateral) (R>L) 11/23/2015   Hypercholesterolemia 06/20/2015   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   Encounter for long-term (current) use of high-risk medication 06/06/2015   Pain medication agreement signed 11/24/2014   Hyperlipidemia 01/25/2014   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    Diverticulitis 10/04/2012   Osteoarthritis 10/04/2012   Chronic upper back pain (2ry area of Pain) (Bilateral) (R>L) 08/17/2012   Urinary incontinence 08/17/2012   Fibromyalgia 06/04/2012   SLE (systemic lupus erythematosus) ([place]) 02/18/2012   GAD (generalized anxiety disorder) 01/07/2012   Major depressive disorder, recurrent ([place]) 09/29/2009    Past Surgical History:  Procedure Laterality Date   ABDOMINAL HYSTERECTOMY     COLONOSCOPY     COLONOSCOPY WITH PROPOFOL N/A 07/03/2017   Procedure: COLONOSCOPY WITH PROPOFOL;  Surgeon: [name], MD;  Location[facility] END[facility]COPY;  Service: Endoscopy;  Laterality: N/A;   COLONOSCOPY WITH PROPOFOL N/A 10/21/2017   Procedure: COLONOSCOPY WITH PROPOFOL;  Surgeon: [name], MD;  Location: [facility] ENDOSCOPY;  Service: Endoscopy;  Laterality: N/A;   ESOPHAGOGASTRODUODENOSCOPY N/A 10/21/2017   Procedure: ESOPHAGOGASTRODUODENOSCOPY (EGD);  Surgeon: [name], MD;  Location: [facility] ENDOSCOPY;  Service: Endoscopy;  Laterality: N/A;   TONSILLECTOMY      OB History   No obstetric history on file.      Home Medications    Prior to Admission medications   Medication Sig Start Date End Date Taking? Authorizing Provider  atorvastatin (LIPITOR) 80 MG tablet Take 80 mg by mouth every evening. 04/19/20   [provider]  clobetasol cream (TEMOVATE) 2.97 % Apply 1 Application topically every 3 (three) days.    [provider]  DULoxetine (CYMBALTA) 30 MG capsule Take 1 capsule (30 mg total) by mouth 2 (two) times daily. 10/15/21   Ursula Alert, MD  eszopiclone (LUNESTA) 2 MG TABS tablet TAKE 1 TABLET BY MOUTH AT BEDTIME AS NEEDED FOR SLEEP. TAKE IMMEDIATELY BEFORE BED 11/07/21   Ursula Alert, MD  gabapentin (NEURONTIN) 300 MG capsule Take 1 capsule (300 mg total) by mouth 3 (three) times daily. 06/26/20 03/03/23  Milinda Pointer, MD  hydroxychloroquine (PLAQUENIL) 200 MG tablet Take 200 mg by mouth in the morning.  01/04/21   [provider]  hydrOXYzine (VISTARIL) 25 MG capsule Take 1 capsule (25 mg total) by mouth daily as needed. For anxiety, sleep 10/15/21   Ursula Alert, MD  oxyCODONE (OXY IR/ROXICODONE) 5 MG immediate release tablet Take 1 tablet (5 mg total) by mouth 2 (two) times daily as needed for severe pain. Must last 30 days 11/07/21 12/07/21  Milinda Pointer, MD  oxyCODONE (OXY IR/ROXICODONE) 5 MG immediate release tablet Take 1 tablet (5 mg total) by mouth 2 (two) times daily as needed for severe pain. Must last 30 days 12/07/21 01/06/22  Milinda Pointer, MD  VENTOLIN HFA 108 [phone] Base) MCG/ACT inhaler INHALE 2 PUFFS INTO THE LUNGS EVERY 6 HOURS AS NEEDED FOR WHEEZING OR SHORTNESS OF BREATH 05/21/21   Tyler Pita, MD    Family History Family History  Problem Relation Age of Onset   Depression Mother    Alcohol abuse Mother    Cancer Mother    Hypertension Mother    Emphysema Mother    Stroke Mother    Glaucoma Father    Heart disease Father    Diabetes Brother    Drug abuse Son     Social History Social History   Tobacco Use   Smoking status: Every Day  Packs/day: 1.00    Years: 35.00    Total pack years: 35.00    Types: Cigarettes    Passive exposure: Past   Smokeless tobacco: Never   Tobacco comments:    1ppd - 03/01/2021  Vaping Use   Vaping Use: Never used  Substance Use Topics   Alcohol use: No   Drug use: No     Allergies   Pregabalin, Trazodone, Bupropion, Methocarbamol, and Tramadol   Review of Systems Review of Systems Defer to HPI    Physical Exam Triage Vital Signs ED Triage Vitals  Enc Vitals Group     BP 11/22/21 1542 (!) 147/77     Pulse Rate 11/22/21 1542 86     Resp --      Temp 11/22/21 1542 97.6 F (36.4 C)     Temp Source 11/22/21 1542 Oral     SpO2 11/22/21 1542 93 %     Weight 11/22/21 1541 145 lb (65.8 kg)     Height 11/22/21 1541 '5\' 3"'$  (1.6 m)     Head Circumference --      Peak Flow --      Pain Score  11/22/21 1541 8     Pain Loc --      Pain Edu? --      Excl. in [place]? --    No data found.  Updated Vital Signs BP (!) 147/77 (BP Location: Left Arm)   Pulse 86   Temp 97.6 F (36.4 C) (Oral)   Ht '5\' 3"'$  (1.6 m)   Wt 145 lb (65.8 kg)   SpO2 93%   BMI 25.69 kg/m   Visual Acuity Right Eye Distance:   Left Eye Distance:   Bilateral Distance:    Right Eye Near:   Left Eye Near:    Bilateral Near:     Physical Exam Constitutional:      Appearance: Normal appearance.  HENT:     Head: Normocephalic.     Right Ear: Tympanic membrane, ear canal and external ear normal.     Ears:     Comments: Severe swelling with erythema and tenderness present to the left ear canal, unable to visualize tympanic membrane, no abnormalities to the external ear, no drainage noted Eyes:     Extraocular Movements: Extraocular movements intact.  Pulmonary:     Effort: Pulmonary effort is normal.  Neurological:     Mental Status: She is alert and oriented to person, place, and time. Mental status is at baseline.      UC Treatments / Results  Labs (all labs ordered are listed, but only abnormal results are displayed) Labs Reviewed - No data to display  EKG   Radiology No results found.  Procedures Procedures (including critical care time)  Medications Ordered in UC Medications - No data to display  Initial Impression / Assessment and Plan / UC Course  I have reviewed the triage vital signs and the nursing notes.  Pertinent labs & imaging results that were available during my care of the patient were reviewed by me and considered in my medical decision making (see chart for details).  Infective otitis externa  Due to significant swelling ear wick placed to keep canal open, prescribed Ciprodex and oral amoxicillin for treatment, advised against any ear cleaning, object or fluid placement within the canal to prevent further irritation, may use over-the-counter analgesics and warm  compresses to the external ear for comfort, given strict precautions that if symptoms persist past use of medicine  or worsen to follow-up for reevaluation Final Clinical Impressions(s) / UC Diagnoses   Final diagnoses:  None   Discharge Instructions   None    ED Prescriptions   None    PDMP not reviewed this encounter.   Hans Eden, NP 11/22/21 [phone]

## 2021-11-22 NOTE — Discharge Instructions (Addendum)
Today you are being treated for an infection of the ear canal  A small wick has been placed in the inside of your ear to keep the canal open due to the extent of swelling, do not remove, it would fall out with time  Take amoxicillin every morning and every evening for 7 days  Place 4 drops of Ciprodex every morning and every evening into the left ear, this medicine is an antibiotic and has steroids in it to help reduce swelling  You may use Tylenol or ibuprofen for management of discomfort  May hold warm compresses to the ear for additional comfort  Please not attempted any ear cleaning or object or fluid placement into the ear canal to prevent further irritation

## 2021-11-22 NOTE — ED Triage Notes (Signed)
Patient reports left ear pain, swollen, and a knot on the outside of it -- started 4 days ago.

## 2021-12-05 ENCOUNTER — Ambulatory Visit (INDEPENDENT_AMBULATORY_CARE_PROVIDER_SITE_OTHER): Payer: Medicaid Other | Admitting: Psychiatry

## 2021-12-05 ENCOUNTER — Encounter: Payer: Self-pay | Admitting: Psychiatry

## 2021-12-05 VITALS — BP 139/85 | HR 102 | Temp 97.6°F | Ht 63.0 in | Wt 143.2 lb

## 2021-12-05 DIAGNOSIS — F411 Generalized anxiety disorder: Secondary | ICD-10-CM

## 2021-12-05 DIAGNOSIS — F3342 Major depressive disorder, recurrent, in full remission: Secondary | ICD-10-CM | POA: Diagnosis not present

## 2021-12-05 DIAGNOSIS — F5101 Primary insomnia: Secondary | ICD-10-CM | POA: Diagnosis not present

## 2021-12-05 DIAGNOSIS — F17200 Nicotine dependence, unspecified, uncomplicated: Secondary | ICD-10-CM

## 2021-12-05 MED ORDER — HYDROXYZINE PAMOATE 25 MG PO CAPS: 25.0000 mg | ORAL_CAPSULE | Freq: Every day | ORAL | 3 refills | Status: DC | PRN

## 2021-12-05 NOTE — Progress Notes (Unsigned)
[facility] MD OP Progress Note  12/05/2021 4:54 PM Tanya [name]  MRN:  [id number]  Chief Complaint:  Chief Complaint  Patient presents with   Follow-up   Medication Refill   Anxiety   Depression   HPI: Tanya [name] is a 61 year old Caucasian female on SSD, separated from husband, lives in [place], has a history of GAD, MDD, insomnia, chronic pain, fibromyalgia, osteoarthritis, SLE was evaluated in office today.  Patient today reports she is currently improving.  Reports that Cymbalta does help.  Denies side effects.  Reports anxiety is more manageable.  Situational stressors at home has improved.  Her son has been staying sober and that has been helpful.  Reports Lunesta did not help with sleep.  She had stopped taking it.  She reports she has been able to fall asleep without any problem at around 9 PM.  When she wakes up she has been taking hydroxyzine as needed and that helps her to go back to sleep.  She has been sleeping from 9 PM to 7 AM.  She reports she is getting enough sleep.  Denies any suicidality, homicidality or perceptual disturbances.  Patient is alert, oriented to person place time situation.  3 word memory immediately 3 out of 3, after 5 minutes 2 out of 3.  Patient's attention and focus seem to be good in the session today.  Patient seems to be motivated to stay in psychotherapy, reports she missed her last appointment with her therapist since she did not have a phone due to financial problems.  She is planning to call her therapist back.  Patient is motivated to cut back on smoking.  Denies any other concerns today.  Visit Diagnosis:    ICD-10-CM   1. GAD (generalized anxiety disorder)  F41.1 hydrOXYzine (VISTARIL) 25 MG capsule    2. MDD (major depressive disorder), recurrent, in partial remission ([place])  F33.41     3. Primary insomnia  F51.01     4. Tobacco use disorder  F17.200       Past Psychiatric History: Reviewed past psychiatric history  from progress note on 02/06/2021.  Past trials of Cymbalta-higher dosage did not tolerate, trazodone-side effect, Wellbutrin-side effect, Paxil-side effect, Prozac-side effect, Valium, doxepin, Trintellix, mirtazapine.  Past Medical History:  Past Medical History:  Diagnosis Date   Anxiety    Bladder infection    8/18   Chronic lower back pain    Chronic neck pain    Collagen vascular disease ([place])    COVID-19    Depression    Diverticulitis    GERD (gastroesophageal reflux disease)    Hyperlipidemia    Lupus (HCC)    Overactive bladder     Past Surgical History:  Procedure Laterality Date   ABDOMINAL HYSTERECTOMY     COLONOSCOPY     COLONOSCOPY WITH PROPOFOL N/A 07/03/2017   Procedure: COLONOSCOPY WITH PROPOFOL;  Surgeon: [name], MD;  Location[facility] ENDOSCOPY;  Service: Endoscopy;  Laterality: N/A;   COLONOSCOPY WITH PROPOFOL N/A 10/21/2017   Procedure: COLONOSCOPY WITH PROPOFOL;  Surgeon: [name], MD;  Location: [facility] ENDOSCOPY;  Service: Endoscopy;  Laterality: N/A;   ESOPHAGOGASTRODUODENOSCOPY N/A 10/21/2017   Procedure: ESOPHAGOGASTRODUODENOSCOPY (EGD);  Surgeon: [name], MD;  Location[facility] ENDOSCOPY;  Service: Endoscopy;  Laterality: N/A;   TONSILLECTOMY      Family Psychiatric History: Reviewed family psychiatric history from progress note on 02/06/2021.  Family History:  Family History  Problem Relation Age of Onset  Depression Mother    Alcohol abuse Mother    Cancer Mother    Hypertension Mother    Emphysema Mother    Stroke Mother    Glaucoma Father    Heart disease Father    Diabetes Brother    Drug abuse Son     Social History: Reviewed social history from progress note on 02/06/2021. Social History   Socioeconomic History   Marital status: Married    Spouse name: Not on file   Number of children: Not on file   Years of education: Not on file   Highest education level: Not on file  Occupational History   Not on file   Tobacco Use   Smoking status: Every Day    Packs/day: 1.00    Years: 35.00    Total pack years: 35.00    Types: Cigarettes    Passive exposure: Past   Smokeless tobacco: Never   Tobacco comments:    1ppd - 03/01/2021  Vaping Use   Vaping Use: Never used  Substance and Sexual Activity   Alcohol use: No   Drug use: No   Sexual activity: Not Currently  Other Topics Concern   Not on file  Social History Narrative   Not on file   Social Determinants of Health   Financial Resource Strain: Not on file  Food Insecurity: Not on file  Transportation Needs: Not on file  Physical Activity: Not on file  Stress: Not on file  Social Connections: Not on file    Allergies:  Allergies  Allergen Reactions   Pregabalin Other (See Comments)    unknown   Trazodone Other (See Comments)    Agitation   Bupropion Anxiety   Methocarbamol Other (See Comments)    Agitation and stiffness   Tramadol Other (See Comments)    Nausea And Vomiting Reports she feels "high"    Metabolic Disorder Labs: No results found for: "HGBA1C", "MPG" No results found for: "PROLACTIN" No results found for: "CHOL", "TRIG", "HDL", "CHOLHDL", "VLDL", "LDLCALC" Lab Results  Component Value Date   TSH 3.347 02/07/2021    Therapeutic Level Labs: No results found for: "LITHIUM" No results found for: "VALPROATE" No results found for: "CBMZ"  Current Medications: Current Outpatient Medications  Medication Sig Dispense Refill   atorvastatin (LIPITOR) 80 MG tablet Take 80 mg by mouth every evening.     clobetasol cream (TEMOVATE) 2.75 % Apply 1 Application topically every 3 (three) days.     DULoxetine (CYMBALTA) 30 MG capsule Take 1 capsule (30 mg total) by mouth 2 (two) times daily. 180 capsule 0   gabapentin (NEURONTIN) 300 MG capsule Take 1 capsule (300 mg total) by mouth 3 (three) times daily. 90 capsule 2   hydroxychloroquine (PLAQUENIL) 200 MG tablet Take 200 mg by mouth in the morning.     oxyCODONE  (OXY IR/ROXICODONE) 5 MG immediate release tablet Take 1 tablet (5 mg total) by mouth 2 (two) times daily as needed for severe pain. Must last 30 days 60 tablet 0   [START ON 12/07/2021] oxyCODONE (OXY IR/ROXICODONE) 5 MG immediate release tablet Take 1 tablet (5 mg total) by mouth 2 (two) times daily as needed for severe pain. Must last 30 days 60 tablet 0   VENTOLIN HFA 108 (90 Base) MCG/ACT inhaler INHALE 2 PUFFS INTO THE LUNGS EVERY 6 HOURS AS NEEDED FOR WHEEZING OR SHORTNESS OF BREATH 18 g 2   hydrOXYzine (VISTARIL) 25 MG capsule Take 1-2 capsules (25-50 mg total) by mouth  daily as needed. For anxiety, sleep 60 capsule 3   No current facility-administered medications for this visit.   Facility-Administered Medications Ordered in Other Visits  Medication Dose Route Frequency Provider Last Rate Last Admin   albuterol (PROVENTIL) (2.5 MG/3ML) 0.083% nebulizer solution 2.5 mg  2.5 mg Nebulization Once Tyler Pita, MD         Musculoskeletal: Strength & Muscle Tone: within normal limits Gait & Station: normal Patient leans: N/A  Psychiatric Specialty Exam: Review of Systems  Psychiatric/Behavioral:  Positive for sleep disturbance. The patient is nervous/anxious.   All other systems reviewed and are negative.   Blood pressure 139/85, pulse (!) 102, temperature 97.6 F (36.4 C), temperature source Oral, height '5\' 3"'$  (1.6 m), weight 143 lb 3.2 oz (65 kg), SpO2 95 %.Body mass index is 25.37 kg/m.  General Appearance: Casual  Eye Contact:  Fair  Speech:  Clear and Coherent  Volume:  Normal  Mood:  Anxious improving  Affect:  Congruent  Thought Process:  Goal Directed and Descriptions of Associations: Intact  Orientation:  Full (Time, Place, and Person)  Thought Content: Logical   Suicidal Thoughts:  No  Homicidal Thoughts:  No  Memory:  Immediate;   Fair Recent;   Fair Remote;   Fair  Judgement:  Fair  Insight:  Fair  Psychomotor Activity:  Normal  Concentration:   Concentration: Fair and Attention Span: Fair  Recall:  [employer] of Knowledge: Fair  Language: Fair  Akathisia:  No  Handed:  Right  AIMS (if indicated): not done  Assets:  Communication Skills Desire for Improvement Housing Intimacy Talents/Skills Transportation  ADL's:  Intact  Cognition: WNL  Sleep:   Improving   Screenings: [place] Office Visit from 06/22/2021 in [place] Total Score 0      Tanya [name] Visit from 12/05/2021 in [place] Office Visit from 10/15/2021 in [place] Office Visit from 10/03/2021 in [place] Counselor from 10/02/2021 in [place] Office Visit from 07/19/2021 in [place]  Total GAD-7 Score '6 7 17 20 1      '$ PHQ2-9    [place] Visit from 12/05/2021 in [place] Office Visit from 10/15/2021 in [place] Office Visit from 10/03/2021 in [place] Counselor from 10/02/2021 in [place] Office Visit from 07/19/2021 in [place]  PHQ-2 Total Score '2 2 6 6 '$ 0  PHQ-9 Total Score '6 5 18 21 '$ 0      [place] Office Visit from 12/05/2021 in [place] ED from 11/22/2021 in [facility] [place] from 10/15/2021 in [place] No Risk No Risk No Risk        Assessment and Plan: ZADIA UHDE is a 61 year old Caucasian female on disability, separated, lives in [place], has a history of depression, chronic pain, anxiety, GERD, fibromyalgia, chronic discoid lupus was evaluated in office today.  Patient is currently improving.  Plan as noted  below.  Plan GAD-improving Cymbalta 30 mg p.o. twice daily Continue CBT with Ms. Christina Hussami -I have communicated with therapist. Patient also advised to call therapist back to schedule appointment.  MDD in remission Cymbalta 30 mg p.o. twice daily  Primary insomnia-improving Continue sleep hygiene techniques  Increase hydroxyzine 25-50 mg p.o. daily as needed for severe sleep problems. Will discontinue Lunesta due to lack of benefit.  Patient advised to limit use of hydroxyzine, discussed long-term side effects.  Tobacco use disorder-improving Provided counseling for 1 minute.  Follow-up in clinic in 3 months or sooner if needed.  This note was generated in part or whole with voice recognition software. Voice recognition is usually quite accurate but there are transcription errors that can and very often do occur. I apologize for any typographical errors that were not detected and corrected.     Ursula Alert, MD 12/05/2021, 4:54 PM

## 2021-12-30 NOTE — Patient Instructions (Incomplete)
____________________________________________________________________________________________  National Pain Medication Shortage  The [place] is experiencing worsening drug shortages. These have had a negative widespread effect on patient care and treatment. Not expected to improve any time soon. Predicted to last past 2029.   Drug shortage list (generic names) Oxycodone IR Oxycodone/APAP Oxymorphone IR Hydromorphone Hydrocodone/APAP Morphine  Where is the problem?  Manufacturing and supply level.  Will this shortage affect you?  Only if you take any of the above pain medications.  How? You may be unable to fill your prescription.  Your pharmacist may offer a "partial fill" of your prescription. (Warning: Do not accept partial fills.) Read our Medication Rules and Regulation. Depending on how much medicine you are dependent on, you may experience withdrawals when unable to get the medication.  Recommendations: Consider ending your dependence on opioid pain medications. Ask your pain specialist to assist you with the process. Consider switching to a medication currently not in shortage, such as Buprenorphine. Talk to your pain specialist about this option. Consider decreasing your pain medication requirements by managing tolerance thru "Drug Holidays". This may help minimize withdrawals, should you run out of medicine. Control your pain thru the use of non-pharmacological interventional therapies.   Your prescriber: Prescribers cannot be blamed for shortages. Medication manufacturing and supply issues cannot be fixed by the prescriber.   NOTE: The prescriber is not responsible for supplying the medication, or solving supply issues. Work with your pharmacist to solve it. The patient is responsible for the decision to take or continue taking the medication and for identifying and securing a legal supply source. By law, supplying the medication is the job and responsibility of the  pharmacy. The prescriber is responsible for the evaluation, monitoring, and prescribing of these medications.   Prescribers will NOT: Re-issue prescriptions that have been partially filled. Re-issue prescriptions already sent to a pharmacy.  Re-send prescriptions to a different pharmacy because yours did not have your medication. Ask pharmacist to order the medicine or transfer it from one of their other pharmacies.  New 2023 regulation: "September 21, 2021 Revised Regulation Allows DEA-Registered Pharmacies to Transfer Electronic Prescriptions at a Patient's Request [place] Patients now have the ability to request their electronic prescription be transferred to another pharmacy without having to go back to their practitioner to initiate the request. This revised regulation went into effect on Monday, September 17, 2021.     At a patient's request, a DEA-registered retail pharmacy can now transfer an electronic prescription for a controlled substance (schedules II-V) to another DEA-registered retail pharmacy. Prior to this change, patients would have to go through their practitioner to cancel their prescription and have it re-issued to a different pharmacy. The process was taxing and time consuming for both patients and practitioners.    The Drug Enforcement Administration [facility]) published its intent to revise the process for transferring electronic prescriptions on December 10, 2019.  The final rule was published in the federal register on August 16, 2021 and went into effect 30 days later.  Under the final rule, a prescription can only be transferred once between pharmacies, and only if allowed under existing state or other applicable law. The prescription must remain in its electronic form; may not be altered in any way; and the transfer must be communicated directly between two licensed pharmacists. It's important to note, any authorized refills transfer  with the original prescription, which means the entire prescription will be filled at the same pharmacy".   [email or web address]  ____________________________________________________________________________________________     ____________________________________________________________________________________________  Patient Information update  To: All of our patients.  Re: Name change.  It has been made official that our current name, "[facility]"   will soon be changed to "[facility]".   The purpose of this change is to eliminate any confusion created by the concept of our practice being a "Medication Management Pain Clinic". In the past this has led to the misconception that we treat pain primarily by the use of prescription medications.  Nothing can be farther from the truth.   Understanding PAIN MANAGEMENT: To further understand what our practice does, you first have to understand that "Pain Management" is a subspecialty that requires additional training once a physician has completed their specialty training, which can be in either Anesthesia, Neurology, Psychiatry, or Physical Medicine and Rehabilitation (PMR). Each one of these contributes to the final approach taken by each physician to the management of their patient's pain. To be a "Pain Management Specialist" you must have first completed one of the specialty trainings below.  Anesthesiologists - trained in clinical pharmacology and interventional techniques such as nerve blockade and regional as well as central neuroanatomy. They are trained to block pain before, during, and after surgical interventions.  Neurologists - trained in the diagnosis and pharmacological treatment of complex neurological conditions, such as Multiple  Sclerosis, Parkinson's, spinal cord injuries, and other systemic conditions that may be associated with symptoms that may include but are not limited to pain. They tend to rely primarily on the treatment of chronic pain using prescription medications.  Psychiatrist - trained in conditions affecting the psychosocial wellbeing of patients including but not limited to depression, anxiety, schizophrenia, personality disorders, addiction, and other substance use disorders that may be associated with chronic pain. They tend to rely primarily on the treatment of chronic pain using prescription medications.   Physical Medicine and Rehabilitation (PMR) physicians, also known as physiatrists - trained to treat a wide variety of medical conditions affecting the brain, spinal cord, nerves, bones, joints, ligaments, muscles, and tendons. Their training is primarily aimed at treating patients that have suffered injuries that have caused severe physical impairment. Their training is primarily aimed at the physical therapy and rehabilitation of those patients. They may also work alongside orthopedic surgeons or neurosurgeons using their expertise in assisting surgical patients to recover after their surgeries.  INTERVENTIONAL PAIN MANAGEMENT is sub-subspecialty of Pain Management.  Our physicians are Board-certified in Anesthesia, Pain Management, and Interventional Pain Management.  This meaning that not only have they been trained and Board-certified in their specialty of Anesthesia, and subspecialty of Pain Management, but they have also received further training in the sub-subspecialty of Interventional Pain Management, in order to become Board-certified as INTERVENTIONAL PAIN MANAGEMENT SPECIALIST.    Mission: Our goal is to use our skills in  [place] as alternatives to the chronic use of prescription opioid medications for the treatment of pain. To make this more clear, we have changed our name  to reflect what we do and offer. We will continue to offer medication management assessment and recommendations, but we will not be taking over any patient's medication management.  ____________________________________________________________________________________________     _______________________________________________________________________  Medication Rules  Purpose: To inform patients, and their family members, of our medication rules and regulations.  Applies to: All patients receiving prescriptions from our practice (written or electronic).  Pharmacy of record: This is the  pharmacy where your electronic prescriptions will be sent. Make sure we have the correct one.  Electronic prescriptions: In compliance with the [place] (STOP) Act of 2017 (Session Lanny Cramp [phone]), effective January 21, 2018, all controlled substances must be electronically prescribed. Written prescriptions, faxing, or calling prescriptions to a pharmacy will no longer be done.  Prescription refills: These will be provided only during in-person appointments. No medications will be renewed without a "face-to-face" evaluation with your provider. Applies to all prescriptions.  NOTE: The following applies primarily to controlled substances (Opioid* Pain Medications).   Type of encounter (visit): For patients receiving controlled substances, face-to-face visits are required. (Not an option and not up to the patient.)  Patient's responsibilities: Pain Pills: Bring all pain pills to every appointment (except for procedure appointments). Pill Bottles: Bring pills in original pharmacy bottle. Bring bottle, even if empty. Always bring the bottle of the most recent fill.  Medication refills: You are responsible for knowing and keeping track of what medications you are taking and when is it that you will need a refill. The day before your appointment: write a list of all  prescriptions that need to be refilled. The day of the appointment: give the list to the admitting nurse. Prescriptions will be written only during appointments. No prescriptions will be written on procedure days. If you forget a medication: it will not be "Called in", "Faxed", or "electronically sent". You will need to get another appointment to get these prescribed. No early refills. Do not call asking to have your prescription filled early. Partial  or short prescriptions: Occasionally your pharmacy may not have enough pills to fill your prescription.  NEVER ACCEPT a partial fill or a prescription that is short of the total amount of pills that you were prescribed.  With controlled substances the law allows 72 hours for the pharmacy to complete the prescription.  If the prescription is not completed within 72 hours, the pharmacist will require a new prescription to be written. This means that you will be short on your medicine and we WILL NOT send another prescription to complete your original prescription.  Instead, request the pharmacy to send a carrier to a nearby branch to get enough medication to provide you with your full prescription. Prescription Accuracy: You are responsible for carefully inspecting your prescriptions before leaving our office. Have the discharge nurse carefully go over each prescription with you, before taking them home. Make sure that your name is accurately spelled, that your address is correct. Check the name and dose of your medication to make sure it is accurate. Check the number of pills, and the written instructions to make sure they are clear and accurate. Make sure that you are given enough medication to last until your next medication refill appointment. Taking Medication: Take medication as prescribed. When it comes to controlled substances, taking less pills or less frequently than prescribed is permitted and encouraged. Never take more pills than instructed. Never  take the medication more frequently than prescribed.  Inform other Doctors: Always inform, all of your healthcare providers, of all the medications you take. Pain Medication from other Providers: You are not allowed to accept any additional pain medication from any other Doctor or Healthcare provider. There are two exceptions to this rule. (see below) In the event that you require additional pain medication, you are responsible for notifying us, as stated below. Cough Medicine: Often these contain an opioid, such as codeine or hydrocodone. Never accept  or take cough medicine containing these opioids if you are already taking an opioid* medication. The combination may cause respiratory failure and death. Medication Agreement: You are responsible for carefully reading and following our Medication Agreement. This must be signed before receiving any prescriptions from our practice. Safely store a copy of your signed Agreement. Violations to the Agreement will result in no further prescriptions. (Additional copies of our Medication Agreement are available upon request.) Laws, Rules, & Regulations: All patients are expected to follow all Federal and [employer], Rules, [employer]. Ignorance of the Laws does not constitute a valid excuse.  Illegal drugs and Controlled Substances: The use of illegal substances (including, but not limited to marijuana and its derivatives) and/or the illegal use of any controlled substances is strictly prohibited. Violation of this rule may result in the immediate and permanent discontinuation of any and all prescriptions being written by our practice. The use of any illegal substances is prohibited. Adopted CDC guidelines & recommendations: Target dosing levels will be at or below 60 MME/day. Use of benzodiazepines** is not recommended.  Exceptions: There are only two exceptions to the rule of not receiving pain medications from other Healthcare Providers. Exception #1  (Emergencies): In the event of an emergency (i.e.: accident requiring emergency care), you are allowed to receive additional pain medication. However, you are responsible for: As soon as you are able, call our office [phone], at any time of the day or night, and leave a message stating your name, the date and nature of the emergency, and the name and dose of the medication prescribed. In the event that your call is answered by a member of our staff, make sure to document and save the date, time, and the name of the person that took your information.  Exception #2 (Planned Surgery): In the event that you are scheduled by another doctor or dentist to have any type of surgery or procedure, you are allowed (for a period no longer than 30 days), to receive additional pain medication, for the acute post-op pain. However, in this case, you are responsible for picking up a copy of our "Post-op Pain Management for Surgeons" handout, and giving it to your surgeon or dentist. This document is available at our office, and does not require an appointment to obtain it. Simply go to our office during business hours (Monday-Thursday from 8:00 AM to 4:00 PM) (Friday 8:00 AM to 12:00 Noon) or if you have a scheduled appointment with [place], prior to your surgery, and ask for it by name. In addition, you are responsible for: calling our office [phone], at any time of the day or night, and leaving a message stating your name, name of your surgeon, type of surgery, and date of procedure or surgery. Failure to comply with your responsibilities may result in termination of therapy involving the controlled substances. Medication Agreement Violation. Following the above rules, including your responsibilities will help you in avoiding a Medication Agreement Violation ("Breaking your Pain Medication Contract").  Consequences:  Not following the above rules may result in permanent discontinuation of medication prescription  therapy.  *Opioid medications include: morphine, codeine, oxycodone, oxymorphone, hydrocodone, hydromorphone, meperidine, tramadol, tapentadol, buprenorphine, fentanyl, methadone. **Benzodiazepine medications include: diazepam (Valium), alprazolam (Xanax), clonazepam (Klonopine), lorazepam (Ativan), clorazepate (Tranxene), chlordiazepoxide (Librium), estazolam (Prosom), oxazepam (Serax), temazepam (Restoril), triazolam (Halcion) (Last updated: 11/13/2021) ______________________________________________________________________    ______________________________________________________________________  Medication Recommendations and Reminders  Applies to: All patients receiving prescriptions (written and/or electronic).  Medication Rules &  Regulations: You are responsible for reading, knowing, and following our "Medication Rules" document. These exist for your safety and that of others. They are not flexible and neither are we. Dismissing or ignoring them is an act of "non-compliance" that may result in complete and irreversible termination of such medication therapy. For safety reasons, "non-compliance" will not be tolerated. As with the [place] fundamental legal principle of "ignorance of the law is no defense", we will accept no excuses for not having read and knowing the content of documents provided to you by our practice.  Pharmacy of record:  Definition: This is the pharmacy where your electronic prescriptions will be sent.  We do not endorse any particular pharmacy. It is up to you and your insurance to decide what pharmacy to use.  We do not restrict you in your choice of pharmacy. However, once we write for your prescriptions, we will NOT be re-sending more prescriptions to fix restricted supply problems created by your pharmacy, or your insurance.  The pharmacy listed in the electronic medical record should be the one where you want electronic prescriptions to be sent. If you choose to  change pharmacy, simply notify our nursing staff. Changes will be made only during your regular appointments and not over the phone.  Recommendations: Keep all of your pain medications in a safe place, under lock and key, even if you live alone. We will NOT replace lost, stolen, or damaged medication. We do not accept "Police Reports" as proof of medications having been stolen. After you fill your prescription, take 1 week's worth of pills and put them away in a safe place. You should keep a separate, properly labeled bottle for this purpose. The remainder should be kept in the original bottle. Use this as your primary supply, until it runs out. Once it's gone, then you know that you have 1 week's worth of medicine, and it is time to come in for a prescription refill. If you do this correctly, it is unlikely that you will ever run out of medicine. To make sure that the above recommendation works, it is very important that you make sure your medication refill appointments are scheduled at least 1 week before you run out of medicine. To do this in an effective manner, make sure that you do not leave the office without scheduling your next medication management appointment. Always ask the nursing staff to show you in your prescription , when your medication will be running out. Then arrange for the receptionist to get you a return appointment, at least 7 days before you run out of medicine. Do not wait until you have 1 or 2 pills left, to come in. This is very poor planning and does not take into consideration that we may need to cancel appointments due to bad weather, sickness, or emergencies affecting our staff. DO NOT ACCEPT A "Partial Fill": If for any reason your pharmacy does not have enough pills/tablets to completely fill or refill your prescription, do not allow for a "partial fill". The law allows the pharmacy to complete that prescription within 72 hours, without requiring a new prescription. If they  do not fill the rest of your prescription within those 72 hours, you will need a separate prescription to fill the remaining amount, which we will NOT provide. If the reason for the partial fill is your insurance, you will need to talk to the pharmacist about payment alternatives for the remaining tablets, but again, DO NOT ACCEPT A PARTIAL FILL,  unless you can trust your pharmacist to obtain the remainder of the pills within 72 hours.  Prescription refills and/or changes in medication(s):  Prescription refills, and/or changes in dose or medication, will be conducted only during scheduled medication management appointments. (Applies to both, written and electronic prescriptions.) No refills on procedure days. No medication will be changed or started on procedure days. No changes, adjustments, and/or refills will be conducted on a procedure day. Doing so will interfere with the diagnostic portion of the procedure. No phone refills. No medications will be "called into the pharmacy". No Fax refills. No weekend refills. No Holliday refills. No after hours refills.  Remember:  Business hours are:  Monday to Thursday 8:00 AM to 4:00 PM Provider's Schedule: Milinda Pointer, MD - Appointments are:  Medication management: Monday and Wednesday 8:00 AM to 4:00 PM Procedure day: Tuesday and Thursday 7:30 AM to 4:00 PM Gillis Santa, MD - Appointments are:  Medication management: Tuesday and Thursday 8:00 AM to 4:00 PM Procedure day: Monday and Wednesday 7:30 AM to 4:00 PM (Last update: 11/13/2021) ______________________________________________________________________    ____________________________________________________________________________________________  Drug Holidays  What is a "Drug Holiday"? Drug Holiday: is the name given to the process of slowly tapering down and temporarily stopping the pain medication for the purpose of decreasing or eliminating tolerance to the  drug.  Benefits Improved effectiveness Decreased required effective dose Improved pain control End dependence on high dose therapy Decrease cost of therapy Uncovering "opioid-induced hyperalgesia". (OIH)  What is "opioid hyperalgesia"? It is a paradoxical increase in pain caused by exposure to opioids. Stopping the opioid pain medication, contrary to the expected, it actually decreases or completely eliminates the pain. Ref.: "A comprehensive review of opioid-induced hyperalgesia". Brion Aliment, et.al. Pain Physician. 2011 Mar-Apr;14(2):145-61.  What is tolerance? Tolerance: the progressive loss of effectiveness of a pain medicine due to repetitive use. A common problem of opioid pain medications.  How long should a "Drug Holiday" last? Effectiveness depends on the patient staying off all opioid pain medicines for a minimum of 14 consecutive days. (2 weeks)  How about just taking less of the medicine? Does not work. Will not accomplish goal of eliminating the excess receptors.  How about switching to a different pain medicine? (AKA. "Opioid rotation") Does not work. Creates the illusion of effectiveness by taking advantage of inaccurate equivalent dose calculations between different opioids. -This "technique" was promoted by studies funded by [employer], such as [employer], creators of "OxyContin".  Can I stop the medicine "cold [place]"? Depends. You should always coordinate with your Pain Specialist to make the transition as smoothly as possible. Avoid stopping the medicine abruptly without consulting. We recommend a "slow taper".  What is a slow taper? Taper: refers to the gradual decrease in dose.   How do I stop/taper the dose? Slowly. Decrease the daily amount of pills that you take by one (1) pill every seven (7) days. This is called a "slow downward taper". Example: if you normally take four (4) pills per day, drop it to three (3) pills per day for seven (7)  days, then to two (2) pills per day for seven (7) days, then to one (1) per day for seven (7) days, and then stop the medicine. The 14 day "Drug Holiday" starts on the first day without medicine.   Will I experience withdrawals? Unlikely with a slow taper.  What triggers withdrawals? Withdrawals are triggered by the sudden/abrupt stop of high dose opioids. Withdrawals can be avoided by  slowly decreasing the dose over a prolonged period of time.  What are withdrawals? Symptoms associated with sudden/abrupt reduction/stopping of high-dose, long-term use of pain medication. Withdrawal are seldom seen on low dose therapy, or patients rarely taking opioid medication.  Early Withdrawal Symptoms may include: Agitation Anxiety Muscle aches Increased tearing Insomnia Runny nose Sweating Yawning  Late symptoms may include: Abdominal cramping Diarrhea Dilated pupils Goose bumps Nausea Vomiting  (Last update: 12/30/2021) ____________________________________________________________________________________________

## 2021-12-30 NOTE — Progress Notes (Unsigned)
PROVIDER NOTE: Information contained herein reflects review and annotations entered in association with encounter. Interpretation of such information and data should be left to medically-trained personnel. Information provided to patient can be located elsewhere in the medical record under "Patient Instructions". Document created using STT-dictation technology, any transcriptional errors that may result from process are unintentional.    Patient: Tanya [name]  Service Category: E/M  Provider: Gaspar Cola, MD  DOB: 1960-07-02  DOS: 12/31/2021  Referring Provider: Leonel Ramsay, MD  MRN: [id number]  Specialty: Interventional Pain Management  PCP: Leonel Ramsay, MD  Type: Established Patient  Setting: Ambulatory outpatient    Location: Office  Delivery: Face-to-face     HPI  Tanya [name], a 61 y.o. year old female, is here today because of her Chronic pain syndrome [G89.4]. Tanya [name] primary complain today is Other (Generalized aches and pain in joints. As well as lupus s/s ) and Back Pain (Mid to lower back ) Last encounter: My last encounter with her was on 10/23/2021. Pertinent problems: Tanya [name] has Chronic upper back pain (2ry area of Pain) (Bilateral) (R>L); Chronic low back pain (3ry area of Pain) (Bilateral) (R>L) w/o sciatica; Chronic neck pain (1ry area of Pain) (Bilateral) (R>L); Fibromyalgia; Osteoarthritis; Chronic pain syndrome; Chronic upper extremity pain (Right); Chronic cervical radicular pain (Right); Chronic shoulder pain (Bilateral) (R>L); Chronic musculoskeletal pain; Cervical spondylosis; Cervical facet syndrome (Bilateral) (R>L); Shoulder radicular pain (Bilateral) (R>L); Acute postoperative pain; Spondylosis without myelopathy or radiculopathy, cervical region; Cervicalgia; Occipital headache (Left); Neurogenic pain; Cervico-occipital neuralgia (Left); Chronic elbow pain (Left); Osteoarthritis of AC (acromioclavicular) joint (Right);  Chronic acromioclavicular joint pain (Right); Chronic shoulder pain (Right); Epicondylitis elbow, medial (Left); Osteoarthritis of shoulders (Bilateral) (R>L); Chronic shoulder pain (Left); Osteoarthritis of shoulder (Left); DDD (degenerative disc disease), cervical; Arthropathy of cervical facet joint (Bilateral); Cervical spine pain; Abnormal MRI, cervical spine (09/20/2015); Cervical central spinal stenosis (Multilevel); 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 Pain Assessment: Severity of Chronic pain is reported as a 7 /10. Location: Back Mid, Lower, Left, Right/? into hips and legs. Onset: More than a month ago. Quality: Discomfort, Constant, Sore. Timing: Constant. Modifying factor(s): medications do help, some days she feels like she needs to take 3 tablets per day instead of the prescribed bid. Vitals:  height is _0  (1.6 m) and weight is 150 lb (68 kg). Her temporal temperature is 96.8 F (36 C) (abnormal). Her blood pressure is 139/81 and her pulse is 88. Her respiration is 16 and oxygen saturation is 97%.  BMI: Estimated body mass index is 26.57 kg/m as calculated from the following:   Height as of this encounter: _1  (1.6 m).   Weight as of this encounter: 150 lb (68 kg).  Reason for encounter: medication management.  The patient indicates doing well with the current medication regimen. No adverse reactions or side effects reported to the medications.   RTCB: 04/06/2022  Nonopioids transfer 12/22/2019: Baclofen and Neurontin  Pharmacotherapy Assessment  Analgesic: None MME/day: 0 mg/day.   Monitoring: [place] PMP: PDMP reviewed during this encounter.       Pharmacotherapy: No side-effects or adverse reactions reported. Compliance: No problems identified. Effectiveness: Clinically acceptable.  Janett Billow, RN   12/31/2021  2:42 PM  Sign when Signing Visit Nursing Pain Medication Assessment:  Safety precautions to be maintained throughout the outpatient stay  will include: orient to surroundings, keep bed in low position, maintain call bell within reach at all times, provide assistance with transfer out of bed and ambulation.  Medication Inspection Compliance:  empty  Medication: Oxycodone IR Pill/Patch Count:  0 of 60 pills remain Pill/Patch Appearance: No markings Bottle Appearance: Standard pharmacy container. Clearly labeled. Filled Date: 72 / 17 / 2023 Last Medication intake:   Friday     No results found for: "CBDTHCR" No results found for: "D8THCCBX" No results found for: "D9THCCBX"  UDS:  Summary  Date Value Ref Range Status  01/31/2021 Note  Final    Comment:    ==================================================================== [employer] [facility] (MW) ==================================================================== Test                             Result       Flag       Units  Drug Present and Declared for Prescription Verification   Oxazepam                       187          EXPECTED   ng/mg creat    Oxazepam may be administered as a scheduled prescription medication;    it is also an expected metabolite of other benzodiazepine drugs,    including diazepam, chlordiazepoxide, prazepam, clorazepate,    halazepam, and temazepam.    Oxycodone                      972          EXPECTED   ng/mg creat   Oxymorphone                    197          EXPECTED   ng/mg creat   Noroxycodone                   500          EXPECTED   ng/mg creat    Sources of oxycodone include scheduled prescription medications.    Oxymorphone and noroxycodone are expected metabolites of oxycodone.    Oxymorphone is also available as a scheduled prescription medication.  ==================================================================== Test                      Result    Flag   Units      Ref  Range   Creatinine              39               mg/dL      >=20 ==================================================================== Declared Medications:  The flagging and interpretation on this report are based on the  following declared medications.  Unexpected results may arise from  inaccuracies in the declared medications.   **Note: The testing scope of this panel includes these medications:   Diazepam (Valium)  Oxycodone (Roxicodone)   **Note: The testing scope of this panel does not include the  following reported medications:   Albuterol (Ventolin HFA)  Atorvastatin (Lipitor)  Baclofen (Lioresal)  Buspirone (Buspar)  Dextromethorphan  Duloxetine (Cymbalta)  Gabapentin (Neurontin)  Promethazine ==================================================================== For clinical consultation, please call [phone]. ====================================================================       ROS  Constitutional: Denies any fever or chills Gastrointestinal: No reported hemesis, hematochezia, vomiting, or acute GI distress Musculoskeletal: Denies any acute onset  joint swelling, redness, loss of ROM, or weakness Neurological: No reported episodes of acute onset apraxia, aphasia, dysarthria, agnosia, amnesia, paralysis, loss of coordination, or loss of consciousness  Medication Review  DULoxetine, albuterol, atorvastatin, clobetasol cream, gabapentin, hydrOXYzine, hydroxychloroquine, naloxone, and oxyCODONE  History Review  Allergy: Tanya [name] is allergic to pregabalin, trazodone, bupropion, methocarbamol, and tramadol. Drug: Tanya [name]  reports no history of drug use. Alcohol:  reports no history of alcohol use. Tobacco:  reports that she has been smoking cigarettes. She has a 35.00 pack-year smoking history. She has been exposed to tobacco smoke. She has never used smokeless tobacco. Social: Tanya [name]  reports that she has been smoking cigarettes. She  has a 35.00 pack-year smoking history. She has been exposed to tobacco smoke. She has never used smokeless tobacco. She reports that she does not drink alcohol and does not use drugs. Medical:  has a past medical history of Anxiety, Bladder infection, Chronic lower back pain, Chronic neck pain, Collagen vascular disease ([place]), COVID-19, Depression, Diverticulitis, GERD (gastroesophageal reflux disease), Hyperlipidemia, Lupus ([place]), and Overactive bladder. Surgical: Tanya [name]  has a past surgical history that includes Abdominal hysterectomy; Colonoscopy; Colonoscopy with propofol (N/A, 07/03/2017); Tonsillectomy; Esophagogastroduodenoscopy (N/A, 10/21/2017); and Colonoscopy with propofol (N/A, 10/21/2017). Family: family history includes Alcohol abuse in her mother; Cancer in her mother; Depression in her mother; Diabetes in her brother; Drug abuse in her son; Emphysema in her mother; Glaucoma in her father; Heart disease in her father; Hypertension in her mother; Stroke in her mother.  Laboratory Chemistry Profile   Renal Lab Results  Component Value Date   BUN 7 06/09/2019   CREATININE 0.72 06/09/2019   BCR 15 09/03/2017   GFRAA >60 06/09/2019   GFRNONAA >60 06/09/2019    Hepatic Lab Results  Component Value Date   AST 26 06/09/2019   ALT 20 06/09/2019   ALBUMIN 4.1 06/09/2019   ALKPHOS 29 (L) 06/09/2019   LIPASE 35 06/09/2019    Electrolytes Lab Results  Component Value Date   NA 135 06/09/2019   K 4.2 06/09/2019   CL 102 06/09/2019   CALCIUM 8.4 (L) 06/09/2019   MG 1.9 09/03/2017    Bone Lab Results  Component Value Date   25OHVITD1 25 (L) 09/03/2017   25OHVITD2 2.9 09/03/2017   25OHVITD3 22 09/03/2017    Inflammation (CRP: Acute Phase) (ESR: Chronic Phase) Lab Results  Component Value Date   CRP 2 09/03/2017   ESRSEDRATE 21 09/03/2017         Note: Above Lab results reviewed.  Recent Imaging Review  DG Chest 2 View CLINICAL DATA:  cough and chest congestion  for over a week  EXAM: CHEST - 2 VIEW  COMPARISON:  Radiograph 03/14/2021, chest CT 06/07/2021  FINDINGS: Unchanged cardiomediastinal silhouette. Persistent left upper low pulmonary nodule as described on prior chest CT. There is no new airspace disease. There is no pleural effusion. No pneumothorax. No acute osseous abnormality.  IMPRESSION: No new airspace disease. Persistent left upper lobe pulmonary nodule as described on prior chest CT.  Electronically Signed   By: Maurine Simmering M.D.   On: 06/30/2021 11:23 Note: Reviewed        Physical Exam  General appearance: Well nourished, well developed, and well hydrated. In no apparent acute distress Mental status: Alert, oriented x 3 (person, place, & time)       Respiratory: No evidence of acute respiratory distress Eyes: PERLA Vitals: BP 139/81 (BP Location: Right Arm, Patient Position: Sitting,  Cuff Size: Normal)   Pulse 88   Temp (!) 96.8 F (36 C) (Temporal)   Resp 16   Ht _0  (1.6 m)   Wt 150 lb (68 kg)   SpO2 97%   BMI 26.57 kg/m  BMI: Estimated body mass index is 26.57 kg/m as calculated from the following:   Height as of this encounter: _1  (1.6 m).   Weight as of this encounter: 150 lb (68 kg). Ideal: Ideal body weight: 52.4 kg (115 lb 8.3 oz) Adjusted ideal body weight: 58.7 kg (129 lb 5 oz)  Assessment   Diagnosis Status  1. Chronic pain syndrome   2. Chronic neck pain (1ry area of Pain) (Bilateral) (R>L)   3. Chronic upper back pain (2ry area of Pain) (Bilateral) (R>L)   4. Chronic low back pain (3ry area of Pain) (Bilateral) (R>L) w/o sciatica   5. Cervicalgia   6. Cervical facet syndrome (Bilateral) (R>L)   7. Cervical spine pain   8. Chronic musculoskeletal pain   9. Chronic shoulder pain (Bilateral) (R>L)   10. Pharmacologic therapy   11. Chronic use of opiate for therapeutic purpose   12. Encounter for medication management   13. Encounter for chronic pain management     Controlled Controlled Controlled   Updated Problems: No problems updated.  Plan of Care  Problem-specific:  No problem-specific Assessment & Plan notes found for this encounter.  Tanya [name] has a current medication list which includes the following long-term medication(s): duloxetine, gabapentin, oxycodone, [START ON 01/06/2022] oxycodone, [START ON 02/05/2022] oxycodone, [START ON 03/07/2022] oxycodone, and ventolin hfa.  Pharmacotherapy (Medications Ordered): Meds ordered this encounter  Medications   naloxone (NARCAN) nasal spray 4 mg/0.1 mL    Sig: Place 1 spray into the nose as needed for up to 365 doses (for opioid-induced respiratory depresssion). In case of emergency (overdose), spray once into each nostril. If no response within 3 minutes, repeat application and call [phone].    Dispense:  1 each    Refill:  0    Instruct patient in proper use of device.   oxyCODONE (OXY IR/ROXICODONE) 5 MG immediate release tablet    Sig: Take 1 tablet (5 mg total) by mouth 2 (two) times daily as needed for severe pain. Must last 30 days    Dispense:  60 tablet    Refill:  0    DO NOT: delete (not duplicate); no partial-fill (will deny script to complete), no refill request (F/U required). DISPENSE: 1 day early if closed on fill date. WARN: No CNS-depressants within 8 hrs of med.   oxyCODONE (OXY IR/ROXICODONE) 5 MG immediate release tablet    Sig: Take 1 tablet (5 mg total) by mouth 2 (two) times daily as needed for severe pain. Must last 30 days    Dispense:  60 tablet    Refill:  0    DO NOT: delete (not duplicate); no partial-fill (will deny script to complete), no refill request (F/U required). DISPENSE: 1 day early if closed on fill date. WARN: No CNS-depressants within 8 hrs of med.   oxyCODONE (OXY IR/ROXICODONE) 5 MG immediate release tablet    Sig: Take 1 tablet (5 mg total) by mouth 2 (two) times daily as needed for severe pain. Must last 30 days    Dispense:  60 tablet     Refill:  0    DO NOT: delete (not duplicate); no partial-fill (will deny script to complete), no refill request (F/U required).  DISPENSE: 1 day early if closed on fill date. WARN: No CNS-depressants within 8 hrs of med.   Orders:  No orders of the defined types were placed in this encounter.  Follow-up plan:   Return in about 3 months (around 04/06/2022) for Eval-day (M,W), (F2F), (MM).     Interventional Therapies  Risk  Complexity Considerations:   Estimated body mass index is 26.57 kg/m as calculated from the following:   Height as of this encounter: _0  (1.6 m).   Weight as of this encounter: 150 lb (68 kg). WNL   Planned  Pending:      Under consideration:      Completed:   Therapeutic left IA shoulder joint inj. x1 (12/30/2019) (100/100/100 x 5 days/0)  Palliative right IA shoulder joint inj. x2 (10/06/2018) (100/100/50/50)  Palliative right AC joint inj. x3 (04/02/2018) (N/A)  Palliative left medial epicondyle elbow inj. x1 (04/02/2018) (N/A)  Diagnostic left GONB x1 (12/25/2017) (N/A)  Palliative right CESI x2 (02/12/2016) (N/A) Therapeutic left C7-T1 cervical ESI x1 (03/18/2016) (100/50/0/0)  Palliative bilateral cervical facet MBB x2 (06/26/2016)  Palliative right cervical facet RFA x3 (04/13/2020) (100/100/75/75)  Palliative left cervical facet RFA x3 (03/14/2020) (100/100/75/75)  Diagnostic/therapeutic midline C7-T1 TPI/MNB x1 (10/08/2021) (100/100/100/100)    Therapeutic  Palliative (PRN) options:   Palliative cervical facet MBB  Palliative cervical facet RFA     Pharmacotherapy:  Nonopioids transfer 12/22/2019: Baclofen and Neurontin Recommendations:   None at this time.     Recent Visits Date Type Provider Dept  10/23/21 Office Visit Milinda Pointer, MD [facility]  10/08/21 Office Visit Milinda Pointer, MD [facility]  Showing recent visits within past 90 days and meeting all other requirements Today's Visits Date Type  Provider Dept  12/31/21 Office Visit Milinda Pointer, MD [facility]  Showing today's visits and meeting all other requirements Future Appointments No visits were found meeting these conditions. Showing future appointments within next 90 days and meeting all other requirements  I discussed the assessment and treatment plan with the patient. The patient was provided an opportunity to ask questions and all were answered. The patient agreed with the plan and demonstrated an understanding of the instructions.  Patient advised to call back or seek an in-person evaluation if the symptoms or condition worsens.  Duration of encounter: 30 minutes.  Total time on encounter, as per AMA guidelines included both the face-to-face and non-face-to-face time personally spent by the physician and/or other qualified health care professional(s) on the day of the encounter (includes time in activities that require the physician or other qualified health care professional and does not include time in activities normally performed by clinical staff). Physician's time may include the following activities when performed: preparing to see the patient (eg, review of tests, pre-charting review of records) obtaining and/or reviewing separately obtained history performing a medically appropriate examination and/or evaluation counseling and educating the patient/family/caregiver ordering medications, tests, or procedures referring and communicating with other health care professionals (when not separately reported) documenting clinical information in the electronic or other health record independently interpreting results (not separately reported) and communicating results to the patient/ family/caregiver care coordination (not separately reported)  Note by: Gaspar Cola, MD Date: 12/31/2021; Time: 2:52 PM

## 2021-12-31 ENCOUNTER — Encounter: Payer: Self-pay | Admitting: Pain Medicine

## 2021-12-31 ENCOUNTER — Ambulatory Visit: Payer: Medicaid Other | Attending: Pain Medicine | Admitting: Pain Medicine

## 2021-12-31 VITALS — BP 139/81 | HR 88 | Temp 96.8°F | Resp 16 | Ht 63.0 in | Wt 150.0 lb

## 2021-12-31 DIAGNOSIS — M542 Cervicalgia: Secondary | ICD-10-CM | POA: Diagnosis not present

## 2021-12-31 DIAGNOSIS — M7918 Myalgia, other site: Secondary | ICD-10-CM | POA: Insufficient documentation

## 2021-12-31 DIAGNOSIS — M549 Dorsalgia, unspecified: Secondary | ICD-10-CM | POA: Diagnosis not present

## 2021-12-31 DIAGNOSIS — M25511 Pain in right shoulder: Secondary | ICD-10-CM | POA: Insufficient documentation

## 2021-12-31 DIAGNOSIS — M545 Low back pain, unspecified: Secondary | ICD-10-CM

## 2021-12-31 DIAGNOSIS — G8929 Other chronic pain: Secondary | ICD-10-CM

## 2021-12-31 DIAGNOSIS — Z79899 Other long term (current) drug therapy: Secondary | ICD-10-CM | POA: Diagnosis present

## 2021-12-31 DIAGNOSIS — G894 Chronic pain syndrome: Secondary | ICD-10-CM | POA: Diagnosis not present

## 2021-12-31 DIAGNOSIS — M25512 Pain in left shoulder: Secondary | ICD-10-CM | POA: Insufficient documentation

## 2021-12-31 DIAGNOSIS — Z79891 Long term (current) use of opiate analgesic: Secondary | ICD-10-CM | POA: Diagnosis present

## 2021-12-31 DIAGNOSIS — M47812 Spondylosis without myelopathy or radiculopathy, cervical region: Secondary | ICD-10-CM

## 2021-12-31 MED ORDER — OXYCODONE HCL 5 MG PO TABS: 5.0000 mg | ORAL_TABLET | Freq: Two times a day (BID) | ORAL | 0 refills | Status: DC | PRN

## 2021-12-31 MED ORDER — NALOXONE HCL 4 MG/0.1ML NA LIQD: 1.0000 | NASAL | 0 refills | Status: DC | PRN

## 2021-12-31 NOTE — Progress Notes (Signed)
Nursing Pain Medication Assessment:  Safety precautions to be maintained throughout the outpatient stay will include: orient to surroundings, keep bed in low position, maintain call bell within reach at all times, provide assistance with transfer out of bed and ambulation.  Medication Inspection Compliance:  empty  Medication: Oxycodone IR Pill/Patch Count:  0 of 60 pills remain Pill/Patch Appearance: No markings Bottle Appearance: Standard pharmacy container. Clearly labeled. Filled Date: 35 / 17 / 2023 Last Medication intake:   Friday

## 2022-01-24 ENCOUNTER — Ambulatory Visit (INDEPENDENT_AMBULATORY_CARE_PROVIDER_SITE_OTHER): Payer: Medicaid Other | Admitting: Licensed Clinical Social Worker

## 2022-01-24 DIAGNOSIS — F5101 Primary insomnia: Secondary | ICD-10-CM | POA: Diagnosis not present

## 2022-01-24 DIAGNOSIS — F3342 Major depressive disorder, recurrent, in full remission: Secondary | ICD-10-CM

## 2022-01-24 DIAGNOSIS — F411 Generalized anxiety disorder: Secondary | ICD-10-CM

## 2022-01-24 NOTE — Progress Notes (Signed)
Virtual Visit via Video Note  I connected with Tanya [name] on 01/24/22 at  2:00 PM EST by a video enabled telemedicine application and verified that I am speaking with the correct person using two identifiers.  Location: Patient: home Provider: [place] [facility]   I discussed the limitations of evaluation and management by telemedicine and the availability of in person appointments. The patient expressed understanding and agreed to proceed.  I discussed the assessment and treatment plan with the patient. The patient was provided an opportunity to ask questions and all were answered. The patient agreed with the plan and demonstrated an understanding of the instructions.   The patient was advised to call back or seek an in-person evaluation if the symptoms worsen or if the condition fails to improve as anticipated.  I provided 18 minutes of non-face-to-face time during this encounter.   [place], LCSW   THERAPIST PROGRESS NOTE  Session Time: 2-218p  Pt requested to end session early due to being in nonconfidential location ([place])   Participation Level: Active  Behavioral Response: NAAlertAnxious  Type of Therapy: Individual Therapy  Treatment Goals addressed:  Reduce overall frequency, intensity, and duration of the anxiety so that daily functioning is not impaired per pt self report 3 out of 5 sessions documented.   Reduce frequency, intensity, and duration of depression symptoms as evidenced by: per pt self report 3 out of 5 sessions documented    ProgressTowards Goals: Progressing  Interventions: CBT, Solution Focused, and Supportive  Summary: Tanya [name] is a 62 y.o. female who presents with continuing symptoms related to depression and anxiety.   Allowed pt to explore and express thoughts and feelings associated with recent life situations and external stressors.Tanya [name] reports that her overall mood has improved  since last session. Patient reports that she feels that she is managing situational anxiety and stress as well. Patient reports that her appetite is within normal limits. Patient denies any negative thoughts about herself, suicidal thoughts, or any thoughts about self harm. Patient reports that relationship between self and daughter and other family members are good. Patient on horses she is trying to socially engage and gets out every day. 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  Patient reports that her insomnia is continuing--allow patient to explore current sleep schedule and any environmental changes that could possibly contribute to better sleep quality and quantity. Patient reports that she does nap at least two or more hours per night before picking up her daughter at 11:30 PM at her job. Patient reports it then takes her a couple of hours to wind down again to rest, and she will sleep until 9:00 AM. Discussed patients bedtime routine and possible behaviors that she could incorporate into her routine that could help her relax prior to bedtime. Patient reflects understanding and willingness to cooperate.  Continued recommendations are as follows: self care behaviors, positive social engagements, focusing on overall work/home/life balance, and focusing on positive physical and emotional wellness.   Suicidal/Homicidal: No   Therapist Response: Pt is continuing to apply interventions learned in session into daily life situations. Pt is currently on track to meet goals utilizing interventions mentioned above. Personal growth and progress noted. Treatment to continue as indicated.   Plan: Return again in 3 months.   Encounter Diagnoses  Name Primary?   GAD (generalized anxiety disorder) Yes   MDD (major depressive disorder), recurrent, in  full remission (Tanya [name])    Primary insomnia     Collaboration of Care: Other pt encouraged to  continue care with psychiatrist of record, Dr. Ursula Alert  Patient/Guardian was advised Release of Information must be obtained prior to any record release in order to collaborate their care with an outside provider. Patient/Guardian was advised if they have not already done so to contact the registration department to sign all necessary forms in order for [place] to release information regarding their care.   Consent: Patient/Guardian gives verbal consent for treatment and assignment of benefits for services provided during this visit. Patient/Guardian expressed understanding and agreed to proceed.   [place], LCSW 01/24/2022

## 2022-02-13 ENCOUNTER — Other Ambulatory Visit: Payer: Self-pay | Admitting: Infectious Diseases

## 2022-02-13 DIAGNOSIS — J439 Emphysema, unspecified: Secondary | ICD-10-CM

## 2022-02-13 DIAGNOSIS — R911 Solitary pulmonary nodule: Secondary | ICD-10-CM

## 2022-02-14 ENCOUNTER — Other Ambulatory Visit: Payer: Self-pay | Admitting: Infectious Diseases

## 2022-02-14 DIAGNOSIS — Z1231 Encounter for screening mammogram for malignant neoplasm of breast: Secondary | ICD-10-CM

## 2022-02-25 ENCOUNTER — Ambulatory Visit (INDEPENDENT_AMBULATORY_CARE_PROVIDER_SITE_OTHER): Payer: Medicaid Other | Admitting: Licensed Clinical Social Worker

## 2022-02-25 DIAGNOSIS — Z91199 Patient's noncompliance with other medical treatment and regimen due to unspecified reason: Secondary | ICD-10-CM

## 2022-02-25 NOTE — Progress Notes (Signed)
LCSW counselor attempted to connect with patient for scheduled appointment via MyChart video text request x 2 and email request with no response; also attempted to connect via phone without success. LCSW counselor left message for patient to call office number to reschedule OPT appointment.   Attempt 1: Text and email: 3:03p  Attempt 2: Text and email:3:08pm  Attempt 3: phone call LMVM: 3:13p  Pt still not connected 3:15p--video session closed   Per [place], after multiple attempts to reach pt unsuccessfully at appointed time--visit will be coded as no show

## 2022-02-26 ENCOUNTER — Ambulatory Visit
Admission: RE | Admit: 2022-02-26 | Discharge: 2022-02-26 | Disposition: A | Discharge status: Home / Self Care | Payer: Medicaid Other | Source: Ambulatory Visit | Attending: Infectious Diseases | Admitting: Infectious Diseases

## 2022-02-26 DIAGNOSIS — J439 Emphysema, unspecified: Secondary | ICD-10-CM | POA: Insufficient documentation

## 2022-02-26 DIAGNOSIS — R911 Solitary pulmonary nodule: Secondary | ICD-10-CM | POA: Insufficient documentation

## 2022-02-28 ENCOUNTER — Other Ambulatory Visit: Payer: Self-pay | Admitting: Psychiatry

## 2022-02-28 DIAGNOSIS — F411 Generalized anxiety disorder: Secondary | ICD-10-CM

## 2022-03-07 ENCOUNTER — Encounter: Payer: Self-pay | Admitting: Psychiatry

## 2022-03-07 ENCOUNTER — Ambulatory Visit (INDEPENDENT_AMBULATORY_CARE_PROVIDER_SITE_OTHER): Payer: Medicaid Other | Admitting: Psychiatry

## 2022-03-07 VITALS — BP 129/82 | HR 89 | Temp 97.9°F | Ht 63.0 in | Wt 147.4 lb

## 2022-03-07 DIAGNOSIS — F17200 Nicotine dependence, unspecified, uncomplicated: Secondary | ICD-10-CM | POA: Diagnosis not present

## 2022-03-07 DIAGNOSIS — F331 Major depressive disorder, recurrent, moderate: Secondary | ICD-10-CM

## 2022-03-07 DIAGNOSIS — F411 Generalized anxiety disorder: Secondary | ICD-10-CM

## 2022-03-07 DIAGNOSIS — F5101 Primary insomnia: Secondary | ICD-10-CM | POA: Diagnosis not present

## 2022-03-07 MED ORDER — SERTRALINE HCL 25 MG PO TABS: 25.0000 mg | ORAL_TABLET | Freq: Every day | ORAL | 1 refills | Status: DC

## 2022-03-07 MED ORDER — NICOTINE 14 MG/24HR TD PT24: 14.0000 mg | MEDICATED_PATCH | Freq: Every day | TRANSDERMAL | 1 refills | Status: AC

## 2022-03-07 NOTE — Progress Notes (Signed)
[facility] MD OP Progress Note  03/07/2022 3:03 PM Tanya [name]  MRN:  [id number]  Chief Complaint:  Chief Complaint  Patient presents with   Follow-up   Anxiety   Depression   Medication Refill   HPI: Tanya [name] is a 62 year old Caucasian female on SSD, separated from husband, lives in [place], has a history of GAD, MDD, insomnia, chronic pain, fibromyalgia, osteoarthritis, SLE was evaluated in office today.  Patient today reports she is currently noncompliant on the Cymbalta.  She reports she had bronchiolitis in January, self-medicated herself with over-the-counter medications during that time.  She reports she did not contact her provider regarding this however believes she feels better.  She reports although she reported Cymbalta was helpful when she came for her last visit she later on decided it was not helpful and hence stopped taking it.  She currently struggles with sadness, low motivation, low energy, concentration problem, worrying about things nervousness on a regular basis.  She reports her mother used to take sertraline in the past and she felt it helped her mother.  She would like to try it.  Patient does report situational stresses, her son who has drug abuse problems is currently admitted to the hospital.  Patient reports she is interested in smoking cessation, interested in nicotine patches, receptive to counseling.  Patient denies any suicidality, homicidality or perceptual disturbances.  Patient denies any other concerns today.  Visit Diagnosis:    ICD-10-CM   1. GAD (generalized anxiety disorder)  F41.1 sertraline (ZOLOFT) 25 MG tablet    2. MDD (major depressive disorder), recurrent episode, moderate (HCC)  F33.1 sertraline (ZOLOFT) 25 MG tablet    3. Primary insomnia  F51.01     4. Tobacco use disorder  F17.200 nicotine (NICODERM CQ - DOSED IN MG/24 HOURS) 14 mg/24hr patch      Past Psychiatric History: Reviewed past theatric history from progress note  on 02/06/2021.  Past trials of Cymbalta-higher dosage did not tolerate, trazodone-side effect, Wellbutrin-side effect, Paxil-side effect, Prozac-side effect, Valium, doxepin, Trintellix, mirtazapine.  Past Medical History:  Past Medical History:  Diagnosis Date   Anxiety    Bladder infection    8/18   Chronic lower back pain    Chronic neck pain    Collagen vascular disease ([place])    COVID-19    Depression    Diverticulitis    GERD (gastroesophageal reflux disease)    Hyperlipidemia    Lupus (HCC)    Overactive bladder     Past Surgical History:  Procedure Laterality Date   ABDOMINAL HYSTERECTOMY     COLONOSCOPY     COLONOSCOPY WITH PROPOFOL N/A 07/03/2017   Procedure: COLONOSCOPY WITH PROPOFOL;  Surgeon: [name], MD;  Location: [facility] ENDOSCOPY;  Service: Endoscopy;  Laterality: N/A;   COLONOSCOPY WITH PROPOFOL N/A 10/21/2017   Procedure: COLONOSCOPY WITH PROPOFOL;  Surgeon: [name], MD;  Location: [facility] ENDOSCOPY;  Service: Endoscopy;  Laterality: N/A;   ESOPHAGOGASTRODUODENOSCOPY N/A 10/21/2017   Procedure: ESOPHAGOGASTRODUODENOSCOPY (EGD);  Surgeon: [name], MD;  Location: [facility] ENDOSCOPY;  Service: Endoscopy;  Laterality: N/A;   TONSILLECTOMY      Family Psychiatric History: Reviewed family psychiatric history from progress note on 02/06/2021.  Family History:  Family History  Problem Relation Age of Onset   Depression Mother    Alcohol abuse Mother    Cancer Mother    Hypertension Mother    Emphysema Mother    Stroke Mother    Glaucoma Father  Heart disease Father    Diabetes Brother    Drug abuse Son     Social History: Reviewed social history from progress note on 02/06/2021. Social History   Socioeconomic History   Marital status: Married    Spouse name: Not on file   Number of children: Not on file   Years of education: Not on file   Highest education level: Not on file  Occupational History   Not on file  Tobacco Use    Smoking status: Every Day    Packs/day: 1.00    Years: 35.00    Total pack years: 35.00    Types: Cigarettes    Passive exposure: Past   Smokeless tobacco: Never   Tobacco comments:    1ppd - 03/01/2021  Vaping Use   Vaping Use: Never used  Substance and Sexual Activity   Alcohol use: No   Drug use: No   Sexual activity: Not Currently  Other Topics Concern   Not on file  Social History Narrative   Not on file   Social Determinants of Health   Financial Resource Strain: Not on file  Food Insecurity: Not on file  Transportation Needs: Not on file  Physical Activity: Not on file  Stress: Not on file  Social Connections: Not on file    Allergies:  Allergies  Allergen Reactions   Pregabalin Other (See Comments)    unknown   Trazodone Other (See Comments)    Agitation   Bupropion Anxiety   Methocarbamol Other (See Comments)    Agitation and stiffness   Tramadol Other (See Comments)    Nausea And Vomiting Reports she feels "high"    Metabolic Disorder Labs: No results found for: "HGBA1C", "MPG" No results found for: "PROLACTIN" No results found for: "CHOL", "TRIG", "HDL", "CHOLHDL", "VLDL", "LDLCALC" Lab Results  Component Value Date   TSH 3.347 02/07/2021    Therapeutic Level Labs: No results found for: "LITHIUM" No results found for: "VALPROATE" No results found for: "CBMZ"  Current Medications: Current Outpatient Medications  Medication Sig Dispense Refill   atorvastatin (LIPITOR) 80 MG tablet Take 80 mg by mouth every evening.     clobetasol cream (TEMOVATE) [id number] % Apply 1 Application topically every 3 (three) days.     gabapentin (NEURONTIN) 300 MG capsule Take 1 capsule (300 mg total) by mouth 3 (three) times daily. 90 capsule 2   hydroxychloroquine (PLAQUENIL) 200 MG tablet Take 200 mg by mouth in the morning.     hydrOXYzine (VISTARIL) 25 MG capsule TAKE 1-2 CAPSULES BY MOUTH ONCE DAILY ASNEEDED FOR ANXIETY OR SLEEP 60 capsule 3   naloxone (NARCAN)  nasal spray 4 mg/0.1 mL Place 1 spray into the nose as needed for up to 365 doses (for opioid-induced respiratory depresssion). In case of emergency (overdose), spray once into each nostril. If no response within 3 minutes, repeat application and call [id number]. 1 each 0   nicotine (NICODERM CQ - DOSED IN MG/24 HOURS) 14 mg/24hr patch Place 1 patch (14 mg total) onto the skin daily. 28 patch 1   oxyCODONE (OXY IR/ROXICODONE) 5 MG immediate release tablet Take 1 tablet (5 mg total) by mouth 2 (two) times daily as needed for severe pain. Must last 30 days 60 tablet 0   oxyCODONE (OXY IR/ROXICODONE) 5 MG immediate release tablet Take 1 tablet (5 mg total) by mouth 2 (two) times daily as needed for severe pain. Must last 30 days 60 tablet 0   sertraline (ZOLOFT) 25 MG tablet  Take 1 tablet (25 mg total) by mouth daily with breakfast. 30 tablet 1   VENTOLIN HFA 108 (90 Base) MCG/ACT inhaler INHALE 2 PUFFS INTO THE LUNGS EVERY 6 HOURS AS NEEDED FOR WHEEZING OR SHORTNESS OF BREATH 18 g 2   oxyCODONE (OXY IR/ROXICODONE) 5 MG immediate release tablet Take 1 tablet (5 mg total) by mouth 2 (two) times daily as needed for severe pain. Must last 30 days 60 tablet 0   oxyCODONE (OXY IR/ROXICODONE) 5 MG immediate release tablet Take 1 tablet (5 mg total) by mouth 2 (two) times daily as needed for severe pain. Must last 30 days 60 tablet 0   No current facility-administered medications for this visit.   Facility-Administered Medications Ordered in Other Visits  Medication Dose Route Frequency Provider Last Rate Last Admin   albuterol (PROVENTIL) (2.5 MG/3ML) 0.083% nebulizer solution 2.5 mg  2.5 mg Nebulization Once Tyler Pita, MD         Musculoskeletal: Strength & Muscle Tone: within normal limits Gait & Station: normal Patient leans: N/A  Psychiatric Specialty Exam: Review of Systems  HENT:  Positive for congestion and sore throat.   Psychiatric/Behavioral:  Positive for decreased concentration and  dysphoric mood. The patient is nervous/anxious.   All other systems reviewed and are negative.   Blood pressure 129/82, pulse 89, temperature 97.9 F (36.6 C), temperature source Skin, height 5' 3"$  (1.6 m), weight 147 lb 6.4 oz (66.9 kg).Body mass index is 26.11 kg/m.  General Appearance: Casual  Eye Contact:  Fair  Speech:  Clear and Coherent  Volume:  Normal  Mood:  Anxious, Depressed, and Dysphoric  Affect:  Depressed  Thought Process:  Goal Directed and Descriptions of Associations: Intact  Orientation:  Full (Time, Place, and Person)  Thought Content: Logical   Suicidal Thoughts:  No  Homicidal Thoughts:  No  Memory:  Immediate;   Fair Recent;   Fair Remote;   Fair  Judgement:  Fair  Insight:  Fair  Psychomotor Activity:  Normal  Concentration:  Concentration: Fair and Attention Span: Fair  Recall:  [employer] of Knowledge: Fair  Language: Fair  Akathisia:  No  Handed:  Right  AIMS (if indicated): not done  Assets:  Communication Skills Desire for Improvement Housing Social Support  ADL's:  Intact  Cognition: WNL  Sleep:   Improving   Screenings: [place] Office Visit from 06/22/2021 in [place] Total Score 0      [place] Office Visit from 03/07/2022 in [place] Office Visit from 12/05/2021 in [place] Office Visit from 10/15/2021 in [place] Office Visit from 10/03/2021 in [place] Counselor from 10/02/2021 in [place] at [facility]  Total GAD-7 Score 9 6 7 17 20      $ PHQ2-9    [place] Office Visit from 03/07/2022 in [place] Office Visit from 12/05/2021 in [place] Office Visit from  10/15/2021 in [place] Office Visit from 10/03/2021 in [place] [facility] Counselor from 10/02/2021 in [place] at [facility] Total Score 6 2 2 6 6  $ PHQ-9 Total Score 14 6 5 18 21      $ [place] Office Visit from 03/07/2022  in [place] Counselor from 01/24/2022 in [place] at [facility] Visit from 12/05/2021 in [place] No Risk No Risk No Risk        Assessment and Plan: Tanya [name] is a 62 year old Caucasian female, on disability, separated, lives in [place], has a history of depression, anxiety, chronic pain, GERD, was evaluated in office today.  Patient is currently noncompliant with her antidepressant, worsening mood symptoms, will benefit from the following plan.  Plan GAD-unstable Start sertraline 25 mg p.o. daily with breakfast Discontinue Cymbalta Continue CBT with Ms. Christina Hussami  MDD-unstable Start sertraline 25 mg p.o. daily with breakfast  Primary insomnia-improving Hydroxyzine 25-50 mg daily as needed for sleep problems Continue sleep hygiene techniques  Tobacco use disorder-unstable Provided counseling for 5 minutes. Start nicotine patch 14 mg/24 hrs-daily.  Follow-up in clinic in 4 to 5 weeks or sooner if needed.   This note was generated in part or whole with voice recognition software. Voice recognition is usually quite accurate but there are transcription errors that can and very often do occur. I apologize for any typographical errors that were not detected and corrected.     Ursula Alert, MD 03/07/2022, 3:03 PM

## 2022-03-07 NOTE — Patient Instructions (Addendum)
Sertraline Tablets What is this medication? SERTRALINE (SER tra leen) treats depression, anxiety, obsessive-compulsive disorder (OCD), post-traumatic stress disorder (PTSD), and premenstrual dysphoric disorder (PMDD). It increases the amount of serotonin in the brain, a hormone that helps regulate mood. It belongs to a group of medications called SSRIs. This medicine may be used for other purposes; ask your health care provider or pharmacist if you have questions. COMMON BRAND NAME(S): Zoloft What should I tell my care team before I take this medication? They need to know if you have any of these conditions: Bleeding disorders Bipolar disorder or a family history of bipolar disorder Frequently drink alcohol Glaucoma Heart disease High blood pressure History of irregular heartbeat History of low levels of calcium, magnesium, or potassium in the blood Liver disease Receiving electroconvulsive therapy Seizures Suicidal thoughts, plans, or attempt by you or a family member Take medications that prevent or treat blood clots Thyroid disease An unusual or allergic reaction to sertraline, other medications, foods, dyes, or preservatives Pregnant or trying to get pregnant Breastfeeding How should I use this medication? Take this medication by mouth with a glass of water. Take it as directed on the prescription label at the same time every day. You can take it with or without food. If it upsets your stomach, take it with food. Do not take your medication more often than directed. Keep taking this medication unless your care team tells you to stop. Stopping it too quickly can cause serious side effects. It can also make your condition worse. A special MedGuide will be given to you by the pharmacist with each prescription and refill. Be sure to read this information carefully each time. Talk to your care team about the use of this medication in children. While it may be prescribed for children as  young as 7 years for selected conditions, precautions do apply. Overdosage: If you think you have taken too much of this medicine contact a poison control center or emergency room at once. NOTE: This medicine is only for you. Do not share this medicine with others. What if I miss a dose? If you miss a dose, take it as soon as you can. If it is almost time for your next dose, take only that dose. Do not take double or extra doses. What may interact with this medication? Do not take this medication with any of the following: Cisapride Dronedarone Linezolid MAOIs, such as Carbex, Eldepryl, Marplan, Nardil, and Parnate Methylene blue (injected into a vein) Pimozide Thioridazine This medication may also interact with the following: Alcohol Amphetamines Aspirin and aspirin-like medications Certain medications for fungal infections, such as ketoconazole, fluconazole, posaconazole, itraconazole Certain medications for irregular heart beat, such as flecainide, quinidine, propafenone Certain medications for mental health conditions Certain medications for migraine headaches, such as almotriptan, eletriptan, frovatriptan, naratriptan, rizatriptan, sumatriptan, zolmitriptan Certain medications for seizures, such as carbamazepine, valproic acid, phenytoin Certain medications for sleep Certain medications that prevent or treat blood clots, such as warfarin, enoxaparin, dalteparin Cimetidine Digoxin Diuretics Fentanyl Isoniazid Lithium NSAIDs, medications for pain and inflammation, such as ibuprofen or naproxen Other medications that cause heart rhythm changes, such as dofetilide Rasagiline Safinamide Supplements, such as St. John's wort, kava kava, valerian Tolbutamide Tramadol Tryptophan This list may not describe all possible interactions. Give your health care provider a list of all the medicines, herbs, non-prescription drugs, or dietary supplements you use. Also tell them if you smoke,  drink alcohol, or use illegal drugs. Some items may interact with your medicine.  What should I watch for while using this medication? Tell your care team if your symptoms do not get better or if they get worse. Visit your care team for regular checks on your progress. Because it may take several weeks to see the full effects of this medication, it is important to continue your treatment as prescribed by your care team. Patients and their families should watch out for new or worsening thoughts of suicide or depression. Also watch out for sudden changes in feelings such as feeling anxious, agitated, panicky, irritable, hostile, aggressive, impulsive, severely restless, overly excited and hyperactive, or not being able to sleep. If this happens, especially at the beginning of treatment or after a change in dose, call your care team. This medication may affect your coordination, reaction time, or judgment. Do not drive or operate machinery until you know how this medication affects you. Sit or stand up slowly to reduce the risk of dizzy or fainting spells. Drinking alcohol with this medication can increase the risk of these side effects. Your mouth may get dry. Chewing sugarless gum or sucking hard candy, and drinking plenty of water may help. Contact your care team if the problem does not go away or is severe. What side effects may I notice from receiving this medication? Side effects that you should report to your care team as soon as possible: Allergic reactions--skin rash, itching, hives, swelling of the face, lips, tongue, or throat Bleeding--bloody or black, tar-like stools, red or dark brown urine, vomiting blood or brown material that looks like coffee grounds, small red or purple spots on skin, unusual bleeding or bruising Heart rhythm changes--fast or irregular heartbeat, dizziness, feeling faint or lightheaded, chest pain, trouble breathing Low sodium level--muscle weakness, fatigue, dizziness,  headache, confusion Serotonin syndrome--irritability, confusion, fast or irregular heartbeat, muscle stiffness, twitching muscles, sweating, high fever, seizure, chills, vomiting, diarrhea Sudden eye pain or change in vision such as blurred vision, seeing halos around lights, vision loss Thoughts of suicide or self-harm, worsening mood Side effects that usually do not require medical attention (report these to your care team if they continue or are bothersome): Change in sex drive or performance Diarrhea Excessive sweating Nausea Tremors or shaking Upset stomach This list may not describe all possible side effects. Call your doctor for medical advice about side effects. You may report side effects to FDA at 1-800-FDA-1088. Where should I keep my medication? Keep out of the reach of children and pets. Store at room temperature between 20 and 25 degrees C (68 and 77 degrees F). Get rid of any unused medication after the expiration date. To get rid of medications that are no longer needed or expired: Take the medication to a medication take-back program. Check with your pharmacy or law enforcement to find a location. If you cannot return the medication, check the label or package insert to see if the medication should be thrown out in the garbage or flushed down the toilet. If you are not sure, ask your care team. If it is safe to put in the trash, empty the medication out of the container. Mix the medication with cat litter, dirt, coffee grounds, or other unwanted substance. Seal the mixture in a bag or container. Put it in the trash. NOTE: This sheet is a summary. It may not cover all possible information. If you have questions about this medicine, talk to your doctor, pharmacist, or health care provider.  2023 Elsevier/Gold Standard (2021-08-07 00:00:00) Smoking Tobacco Information, Adult Smoking tobacco  can be harmful to your health. Tobacco contains a toxic colorless chemical called nicotine.  Nicotine causes changes in your brain that make you want more and more. This is called addiction. This can make it hard to stop smoking once you start. Tobacco also has other toxic chemicals that can hurt your body and raise your risk of many cancers. Menthol or "lite" tobacco or cigarette brands are not safer than regular brands. How can smoking tobacco affect me? Smoking tobacco puts you at risk for: Cancer. Smoking is most commonly associated with lung cancer, but can also lead to cancer in other parts of the body. Chronic obstructive pulmonary disease (COPD). This is a long-term lung condition that makes it hard to breathe. It also gets worse over time. High blood pressure (hypertension), heart disease, stroke, heart attack, and lung infections, such as pneumonia. Cataracts. This is when the lenses in the eyes become clouded. Digestive problems. This may include peptic ulcers, heartburn, and gastroesophageal reflux disease (GERD). Oral health problems, such as gum disease, mouth sores, and tooth loss. Loss of taste and smell. Smoking also affects how you look and smell. Smoking may cause: Wrinkles. Yellow or stained teeth, fingers, and fingernails. Bad breath. Bad-smelling clothes and hair. Smoking tobacco can also affect your social life, because: It may be challenging to find places to smoke when away from home. Many workplaces, [employer], hotels, and public places are tobacco-free. Smoking is expensive. This is due to the cost of tobacco and the long-term costs of treating health problems from smoking. Secondhand smoke may affect those around you. Secondhand smoke can cause lung cancer, breathing problems, and heart disease. Children of smokers have a higher risk for: Sudden infant death syndrome (SIDS). Ear infections. Lung infections. What actions can I take to prevent health problems? Quit smoking  Do not start smoking. Quit if you already smoke. Do not replace cigarette  smoking with vaping devices, such as e-cigarettes. Make a plan to quit smoking and commit to it. Look for programs to help you, and ask your health care provider for recommendations and ideas. Set a date and write down all the reasons you want to quit. Let your friends and family know you are quitting so they can help and support you. Consider finding friends who also want to quit. It can be easier to quit with someone else, so that you can support each other. Talk with your health care provider about using nicotine replacement medicines to help you quit. These include gum, lozenges, patches, sprays, or pills. If you try to quit but return to smoking, stay positive. It is common to slip up when you first quit, so take it one day at a time. Be prepared for cravings. When you feel the urge to smoke, chew gum or suck on hard candy. Lifestyle Stay busy. Take care of your body. Get plenty of exercise, eat a healthy diet, and drink plenty of water. Find ways to manage your stress, such as meditation, yoga, exercise, or time spent with friends and family. Ask your health care provider about having regular tests (screenings) to check for cancer. This may include blood tests, imaging tests, and other tests. Where to find support To get support to quit smoking, consider: Asking your health care provider for more information and resources. Joining a support group for people who want to quit smoking in your local community. There are many effective programs that may help you to quit. Calling the smokefree.gov counselor helpline at 1-800-QUIT-NOW 8544414620). Where  to find more information You may find more information about quitting smoking from: Centers for Disease Control and Prevention: https://www.chang-huffman.com/ https://hall.com/: smokefree.gov American Lung Association: freedomfromsmoking.org Contact a health care provider if: You have problems breathing. Your lips, nose, or fingers turn blue. You have chest  pain. You are coughing up blood. You feel like you will faint. You have other health changes that cause you to worry. Summary Smoking tobacco can negatively affect your health, the health of those around you, your finances, and your social life. Do not start smoking. Quit if you already smoke. If you need help quitting, ask your health care provider. Consider joining a support group for people in your local community who want to quit smoking. There are many effective programs that may help you to quit. This information is not intended to replace advice given to you by your health care provider. Make sure you discuss any questions you have with your health care provider. Document Revised: 01/02/2021 Document Reviewed: 01/02/2021 Elsevier Patient Education  [place].

## 2022-03-13 ENCOUNTER — Ambulatory Visit
Admission: RE | Admit: 2022-03-13 | Discharge: 2022-03-13 | Disposition: A | Discharge status: Home / Self Care | Payer: Medicaid Other | Source: Ambulatory Visit | Attending: Infectious Diseases | Admitting: Infectious Diseases

## 2022-03-13 DIAGNOSIS — Z1231 Encounter for screening mammogram for malignant neoplasm of breast: Secondary | ICD-10-CM | POA: Insufficient documentation

## 2022-03-14 ENCOUNTER — Telehealth: Payer: Self-pay

## 2022-03-14 NOTE — Telephone Encounter (Signed)
Based on her most recent evaluation Cymbalta was stopped by patient due to side effects as well as being ineffective.  I do not recommend going back and forth with medications.  She could either give the Zoloft more time and let her body get adjusted.  Or she can cut the Zoloft into half tablet and stay on the half tablet until she sees me.  However if she is having significant side effects she could stop the Zoloft and be reevaluated and at that visit we can decide medication management.  Please let patient know.  I am also attaching Otila Kluver to schedule this patient-please double book on March 6 at 4:30 PM-let patient know there will be wait time since being double booked.

## 2022-03-14 NOTE — Telephone Encounter (Signed)
pt called states that she feels different on the zoloft, she feels slow and nothing much to say. she states she like to go back on the cymbalta.

## 2022-03-15 NOTE — Telephone Encounter (Signed)
Noted  

## 2022-03-15 NOTE — Telephone Encounter (Signed)
notified patient and per the patient she not going to take any more of the zoloft and she going to take the cymbalta. against what was recommend by dr. Shea Evans.

## 2022-03-18 ENCOUNTER — Inpatient Hospital Stay
Admission: RE | Admit: 2022-03-18 | Discharge: 2022-03-18 | Disposition: A | Discharge status: Home / Self Care | Payer: Self-pay | Source: Ambulatory Visit | Attending: *Deleted | Admitting: *Deleted

## 2022-03-18 ENCOUNTER — Other Ambulatory Visit: Payer: Self-pay | Admitting: *Deleted

## 2022-03-18 DIAGNOSIS — Z1231 Encounter for screening mammogram for malignant neoplasm of breast: Secondary | ICD-10-CM

## 2022-03-19 ENCOUNTER — Ambulatory Visit: Payer: Medicaid Other | Admitting: Pulmonary Disease

## 2022-04-01 ENCOUNTER — Ambulatory Visit: Payer: Medicaid Other | Attending: Pain Medicine | Admitting: Pain Medicine

## 2022-04-01 DIAGNOSIS — M25511 Pain in right shoulder: Secondary | ICD-10-CM

## 2022-04-01 DIAGNOSIS — G894 Chronic pain syndrome: Secondary | ICD-10-CM | POA: Diagnosis not present

## 2022-04-01 DIAGNOSIS — M549 Dorsalgia, unspecified: Secondary | ICD-10-CM | POA: Diagnosis not present

## 2022-04-01 DIAGNOSIS — Z79891 Long term (current) use of opiate analgesic: Secondary | ICD-10-CM

## 2022-04-01 DIAGNOSIS — M7918 Myalgia, other site: Secondary | ICD-10-CM

## 2022-04-01 DIAGNOSIS — M542 Cervicalgia: Secondary | ICD-10-CM

## 2022-04-01 DIAGNOSIS — M47812 Spondylosis without myelopathy or radiculopathy, cervical region: Secondary | ICD-10-CM

## 2022-04-01 DIAGNOSIS — G8929 Other chronic pain: Secondary | ICD-10-CM

## 2022-04-01 DIAGNOSIS — Z79899 Other long term (current) drug therapy: Secondary | ICD-10-CM

## 2022-04-01 DIAGNOSIS — M25512 Pain in left shoulder: Secondary | ICD-10-CM

## 2022-04-01 DIAGNOSIS — M545 Low back pain, unspecified: Secondary | ICD-10-CM

## 2022-04-01 MED ORDER — OXYCODONE HCL 5 MG PO TABS: 5.0000 mg | ORAL_TABLET | Freq: Two times a day (BID) | ORAL | 0 refills | Status: DC | PRN

## 2022-04-01 NOTE — Patient Instructions (Signed)
____________________________________________________________________________________________  Opioid Pain Medication Update  To: All patients taking opioid pain medications. (I.e.: hydrocodone, hydromorphone, oxycodone, oxymorphone, morphine, codeine, methadone, tapentadol, tramadol, buprenorphine, fentanyl, etc.)  Re: Updated review of side effects and adverse reactions of opioid analgesics, as well as new information about long term effects of this class of medications.  Direct risks of long-term opioid therapy are not limited to opioid addiction and overdose. Potential medical risks include serious fractures, breathing problems during sleep, hyperalgesia, immunosuppression, chronic constipation, bowel obstruction, myocardial infarction, and tooth decay secondary to xerostomia.  Unpredictable adverse effects that can occur even if you take your medication correctly: Cognitive impairment, respiratory depression, and death. Most people think that if they take their medication "correctly", and "as instructed", that they will be safe. Nothing could be farther from the truth. In reality, a significant amount of recorded deaths associated with the use of opioids has occurred in individuals that had taken the medication for a long time, and were taking their medication correctly. The following are examples of how this can happen: Patient taking his/her medication for a long time, as instructed, without any side effects, is given a certain antibiotic or another unrelated medication, which in turn triggers a "Drug-to-drug interaction" leading to disorientation, cognitive impairment, impaired reflexes, respiratory depression or an untoward event leading to serious bodily harm or injury, including death.  Patient taking his/her medication for a long time, as instructed, without any side effects, develops an acute impairment of liver and/or kidney function. This will lead to a rapid inability of the body to  breakdown and eliminate their pain medication, which will result in effects similar to an "overdose", but with the same medicine and dose that they had always taken. This again may lead to disorientation, cognitive impairment, impaired reflexes, respiratory depression or an untoward event leading to serious bodily harm or injury, including death.  A similar problem will occur with patients as they grow older and their liver and kidney function begins to decrease as part of the aging process.  Background information: Historically, the original case for using long-term opioid therapy to treat chronic noncancer pain was based on safety assumptions that subsequent experience has called into question. In 1996, the American Pain Society and the [place] [employer] issued a consensus statement supporting long-term opioid therapy. This statement acknowledged the dangers of opioid prescribing but concluded that the risk for addiction was low; respiratory depression induced by opioids was short-lived, occurred mainly in opioid-naive patients, and was antagonized by pain; tolerance was not a common problem; and efforts to control diversion should not constrain opioid prescribing. This has now proven to be wrong. Experience regarding the risks for opioid addiction, misuse, and overdose in community practice has failed to support these assumptions.  According to the Centers for Disease Control and Prevention, fatal overdoses involving opioid analgesics have increased sharply over the past decade. Currently, more than 96,700 people die from drug overdoses every year. Opioids are a factor in 7 out of every 10 overdose deaths. Deaths from drug overdose have surpassed motor vehicle accidents as the leading cause of death for individuals between the ages of 3 and 36.  Clinical data suggest that neuroendocrine dysfunction may be very common in both men and women, potentially causing hypogonadism, erectile  dysfunction, infertility, decreased libido, osteoporosis, and depression. Recent studies linked higher opioid dose to increased opioid-related mortality. Controlled observational studies reported that long-term opioid therapy may be associated with increased risk for cardiovascular events. Subsequent meta-analysis concluded  that the safety of long-term opioid therapy in elderly patients has not been proven.   Side Effects and adverse reactions: Common side effects: Drowsiness (sedation). Dizziness. Nausea and vomiting. Constipation. Physical dependence -- Dependence often manifests with withdrawal symptoms when opioids are discontinued or decreased. Tolerance -- As you take repeated doses of opioids, you require increased medication to experience the same effect of pain relief. Respiratory depression -- This can occur in healthy people, especially with higher doses. However, people with COPD, asthma or other lung conditions may be even more susceptible to fatal respiratory impairment.  Uncommon side effects: An increased sensitivity to feeling pain and extreme response to pain (hyperalgesia). Chronic use of opioids can lead to this. Delayed gastric emptying (the process by which the contents of your stomach are moved into your small intestine). Muscle rigidity. Immune system and hormonal dysfunction. Quick, involuntary muscle jerks (myoclonus). Arrhythmia. Itchy skin (pruritus). Dry mouth (xerostomia).  Long-term side effects: Chronic constipation. Sleep-disordered breathing (SDB). Increased risk of bone fractures. Hypothalamic-pituitary-adrenal dysregulation. Increased risk of overdose.  RISKS: Fractures and Falls:  Opioids increase the risk and incidence of falls. This is of particular importance in elderly patients.  Endocrine System:  Long-term administration is associated with endocrine abnormalities (endocrinopathies). (Also known as Opioid-induced Endocrinopathy) Influences  on both the hypothalamic-pituitary-adrenal axis?and the hypothalamic-pituitary-gonadal axis have been demonstrated with consequent hypogonadism and adrenal insufficiency in both sexes. Hypogonadism and decreased levels of dehydroepiandrosterone sulfate have been reported in men and women. Endocrine effects include: Amenorrhoea in women (abnormal absence of menstruation) Reduced libido in both sexes Decreased sexual function Erectile dysfunction in men Hypogonadisms (decreased testicular function with shrinkage of testicles) Infertility Depression and fatigue Loss of muscle mass Anxiety Depression Immune suppression Hyperalgesia Weight gain Anemia Osteoporosis Patients (particularly women of childbearing age) should avoid opioids. There is insufficient evidence to recommend routine monitoring of asymptomatic patients taking opioids in the long-term for hormonal deficiencies.  Immune System: Human studies have demonstrated that opioids have an immunomodulating effect. These effects are mediated via opioid receptors both on immune effector cells and in the central nervous system. Opioids have been demonstrated to have adverse effects on antimicrobial response and anti-tumour surveillance. Buprenorphine has been demonstrated to have no impact on immune function.  Opioid Induced Hyperalgesia: Human studies have demonstrated that prolonged use of opioids can lead to a state of abnormal pain sensitivity, sometimes called opioid induced hyperalgesia (OIH). Opioid induced hyperalgesia is not usually seen in the absence of tolerance to opioid analgesia. Clinically, hyperalgesia may be diagnosed if the patient on long-term opioid therapy presents with increased pain. This might be qualitatively and anatomically distinct from pain related to disease progression or to breakthrough pain resulting from development of opioid tolerance. Pain associated with hyperalgesia tends to be more diffuse than the  pre-existing pain and less defined in quality. Management of opioid induced hyperalgesia requires opioid dose reduction.  Cancer: Chronic opioid therapy has been associated with an increased risk of cancer among noncancer patients with chronic pain. This association was more evident in chronic strong opioid users. Chronic opioid consumption causes significant pathological changes in the small intestine and colon. Epidemiological studies have found that there is a link between opium dependence and initiation of gastrointestinal cancers. Cancer is the second leading cause of death after cardiovascular disease. Chronic use of opioids can cause multiple conditions such as GERD, immunosuppression and renal damage as well as carcinogenic effects, which are associated with the incidence of cancers.   Mortality: Long-term opioid use  has been associated with increased mortality among patients with chronic non-cancer pain (CNCP).  Prescription of long-acting opioids for chronic noncancer pain was associated with a significantly increased risk of all-cause mortality, including deaths from causes other than overdose.  Reference: Von Korff M, Kolodny A, Deyo RA, Chou R. Long-term opioid therapy reconsidered. Ann Intern Med. 2011 Sep 6;155(5):325-8. doi: 10.7326/[id number]. PMID: [id number]; PMCIDXX:1631110. Morley Kos, Hayward RA, Dunn KM, [place] KP. Risk of adverse events in patients prescribed long-term opioids: A cohort study in the [place] Clinical Practice Research D[employer]. Eur J Pain. 2019 May;23(5):908-922. doi: 10.1002/ejp.1357. Epub 2019 Jan 31. PMID: [id number]. Colameco S, Coren JS, Ciervo CA. Continuous opioid treatment for chronic noncancer pain: a time for moderation in prescribing. Postgrad Med. 2009 Jul;121(4):61-6. doi: 10.3810/pgm.2009.07.2032. PMID: [id number]. Heywood Bene RN, [place] SD, Blazina I, Rosalio Loud, Bougatsos C, Deyo RA. The  effectiveness and risks of long-term opioid therapy for chronic pain: a systematic review for a [employer] of Health Pathways to [employer]. Ann Intern Med. 2015 Feb 17;162(4):276-86. doi: [id number]. PMID: [id number]. Marjory Sneddon [facility], [place] DM. NCHS Data Brief No. 22. [place]: Centers for Disease Control and Prevention; 2009. Sep, Increase in Fatal Poisonings Involving Opioid Analgesics in the [place], 1999-2006. Song IA, Choi HR, Oh TK. Long-term opioid use and mortality in patients with chronic non-cancer pain: Ten-year follow-up study in [place] from 2010 through 2019. EClinicalMedicine. 2022 Jul 18;51:101558. doi: 10.1016/j.eclinm.2022.UB:[id number]. PMID: [id number]; PMCIDOX:8550940. Huser, W., Schubert, T., Vogelmann, T. et al. All-cause mortality in patients with long-term opioid therapy compared with non-opioid analgesics for chronic non-cancer pain: a database study. [place] Med 18, 162 (2020). [email or web address] Rashidian H, Roxy Cedar, Malekzadeh R, Haghdoost AA. An Ecological Study of the Association between Opiate Use and Incidence of Cancers. Addict Health. 2016 Fall;8(4):252-260. PMID: [id number]; PMCIDQI:9185013.  Our Goal: Our goal is to control your pain with means other than the use of opioid pain medications.  Our Recommendation: Talk to your physician about coming off of these medications. We can assist you with the tapering down and stopping these medicines. Based on the new information, even if you cannot completely stop the medication, a decrease in the dose may be associated with a lesser risk. Ask for other means of controlling the pain. Decrease or eliminate those factors that significantly contribute to your pain such as smoking, obesity, and a diet heavily tilted towards "inflammatory" nutrients.  Last Updated: 03/20/2022    ____________________________________________________________________________________________     ____________________________________________________________________________________________  Patient Information update  To: All of our patients.  Re: Name change.  It has been made official that our current name, "[facility]"   will soon be changed to "[facility]".   The purpose of this change is to eliminate any confusion created by the concept of our practice being a "Medication Management Pain Clinic". In the past this has led to the misconception that we treat pain primarily by the use of prescription medications.  Nothing can be farther from the truth.   Understanding PAIN MANAGEMENT: To further understand what our practice does, you first have to understand that "Pain Management" is a subspecialty that requires additional training once a physician has completed their specialty training, which can be in either Anesthesia, Neurology, Psychiatry, or Physical Medicine and Rehabilitation (PMR). Each one of these contributes to the final approach taken by each physician to  the management of their patient's pain. To be a "Pain Management Specialist" you must have first completed one of the specialty trainings below.  Anesthesiologists - trained in clinical pharmacology and interventional techniques such as nerve blockade and regional as well as central neuroanatomy. They are trained to block pain before, during, and after surgical interventions.  Neurologists - trained in the diagnosis and pharmacological treatment of complex neurological conditions, such as Multiple Sclerosis, Parkinson's, spinal cord injuries, and other systemic conditions that may be associated with symptoms that may include but are not limited to pain. They tend to rely primarily on the treatment of chronic pain  using prescription medications.  Psychiatrist - trained in conditions affecting the psychosocial wellbeing of patients including but not limited to depression, anxiety, schizophrenia, personality disorders, addiction, and other substance use disorders that may be associated with chronic pain. They tend to rely primarily on the treatment of chronic pain using prescription medications.   Physical Medicine and Rehabilitation (PMR) physicians, also known as physiatrists - trained to treat a wide variety of medical conditions affecting the brain, spinal cord, nerves, bones, joints, ligaments, muscles, and tendons. Their training is primarily aimed at treating patients that have suffered injuries that have caused severe physical impairment. Their training is primarily aimed at the physical therapy and rehabilitation of those patients. They may also work alongside orthopedic surgeons or neurosurgeons using their expertise in assisting surgical patients to recover after their surgeries.  INTERVENTIONAL PAIN MANAGEMENT is sub-subspecialty of Pain Management.  Our physicians are Board-certified in Anesthesia, Pain Management, and Interventional Pain Management.  This meaning that not only have they been trained and Board-certified in their specialty of Anesthesia, and subspecialty of Pain Management, but they have also received further training in the sub-subspecialty of Interventional Pain Management, in order to become Board-certified as INTERVENTIONAL PAIN MANAGEMENT SPECIALIST.    Mission: Our goal is to use our skills in  [place] as alternatives to the chronic use of prescription opioid medications for the treatment of pain. To make this more clear, we have changed our name to reflect what we do and offer. We will continue to offer medication management assessment and recommendations, but we will not be taking over any patient's medication  management.  ____________________________________________________________________________________________     ____________________________________________________________________________________________  National Pain Medication Shortage  The [place] is experiencing worsening drug shortages. These have had a negative widespread effect on patient care and treatment. Not expected to improve any time soon. Predicted to last past 2029.   Drug shortage list (generic names) Oxycodone IR Oxycodone/APAP Oxymorphone IR Hydromorphone Hydrocodone/APAP Morphine  Where is the problem?  Manufacturing and supply level.  Will this shortage affect you?  Only if you take any of the above pain medications.  How? You may be unable to fill your prescription.  Your pharmacist may offer a "partial fill" of your prescription. (Warning: Do not accept partial fills.) Prescriptions partially filled cannot be transferred to another pharmacy. Read our Medication Rules and Regulation. Depending on how much medicine you are dependent on, you may experience withdrawals when unable to get the medication.  Recommendations: Consider ending your dependence on opioid pain medications. Ask your pain specialist to assist you with the process. Consider switching to a medication currently not in shortage, such as Buprenorphine. Talk to your pain specialist about this option. Consider decreasing your pain medication requirements by managing tolerance thru "Drug Holidays". This may help minimize withdrawals, should you run out of medicine. Control your pain thru  the use of non-pharmacological interventional therapies.   Your prescriber: Prescribers cannot be blamed for shortages. Medication manufacturing and supply issues cannot be fixed by the prescriber.   NOTE: The prescriber is not responsible for supplying the medication, or solving supply issues. Work with your pharmacist to solve it. The patient is responsible for  the decision to take or continue taking the medication and for identifying and securing a legal supply source. By law, supplying the medication is the job and responsibility of the pharmacy. The prescriber is responsible for the evaluation, monitoring, and prescribing of these medications.   Prescribers will NOT: Re-issue prescriptions that have been partially filled. Re-issue prescriptions already sent to a pharmacy.  Re-send prescriptions to a different pharmacy because yours did not have your medication. Ask pharmacist to order more medicine or transfer the prescription to another pharmacy. (Read below.)  New 2023 regulation: "September 21, 2021 Revised Regulation Allows DEA-Registered Pharmacies to Transfer Electronic Prescriptions at a Patient's Request [place] Patients now have the ability to request their electronic prescription be transferred to another pharmacy without having to go back to their practitioner to initiate the request. This revised regulation went into effect on Monday, September 17, 2021.     At a patient's request, a DEA-registered retail pharmacy can now transfer an electronic prescription for a controlled substance (schedules II-V) to another DEA-registered retail pharmacy. Prior to this change, patients would have to go through their practitioner to cancel their prescription and have it re-issued to a different pharmacy. The process was taxing and time consuming for both patients and practitioners.    The Drug Enforcement Administration [facility]) published its intent to revise the process for transferring electronic prescriptions on December 10, 2019.  The final rule was published in the federal register on August 16, 2021 and went into effect 30 days later.  Under the final rule, a prescription can only be transferred once between pharmacies, and only if allowed under existing state or other applicable law. The prescription must  remain in its electronic form; may not be altered in any way; and the transfer must be communicated directly between two licensed pharmacists. It's important to note, any authorized refills transfer with the original prescription, which means the entire prescription will be filled at the same pharmacy".  Reference: [email or web address] [facility] website announcement)  [email or web address].pdf ([place])   [employer] / Vol. 88, No. 143 / Thursday, August 16, 2021 / Rules and Regulations DEPARTMENT OF JUSTICE  Drug Enforcement Administration  21 CFR Part 1306  [Docket No. [id number]]  RIN [id number] Transfer of Electronic Prescriptions for Schedules II-V Controlled Substances Between Pharmacies for Initial Filling  ____________________________________________________________________________________________     ____________________________________________________________________________________________  Transfer of Pain Medication between Pharmacies  Re: 2023 DEA Clarification on existing regulation  Published on DEA Website: September 21, 2021  Title: Revised Regulation Allows DEA-Registered Pharmacies to Conservator, museum/gallery Prescriptions at a Patient's Request [place]  "Patients now have the ability to request their electronic prescription be transferred to another pharmacy without having to go back to their practitioner to initiate the request. This revised regulation went into effect on Monday, September 17, 2021.     At a patient's request, a DEA-registered retail pharmacy can now transfer an electronic prescription for a controlled substance (schedules II-V) to another DEA-registered retail pharmacy. Prior to this change, patients would have to go through their practitioner to  cancel their prescription  and have it re-issued to a different pharmacy. The process was taxing and time consuming for both patients and practitioners.    The Drug Enforcement Administration [facility]) published its intent to revise the process for transferring electronic prescriptions on December 10, 2019.  The final rule was published in the federal register on August 16, 2021 and went into effect 30 days later.  Under the final rule, a prescription can only be transferred once between pharmacies, and only if allowed under existing state or other applicable law. The prescription must remain in its electronic form; may not be altered in any way; and the transfer must be communicated directly between two licensed pharmacists. It's important to note, any authorized refills transfer with the original prescription, which means the entire prescription will be filled at the same pharmacy."    REFERENCES: 1. DEA website announcement [email or web address]  2. Department of Justice website  [email or web address].pdf  3. DEPARTMENT OF JUSTICE Drug Enforcement Administration 21 CFR Part 1306 [Docket No. [id number]-637] RIN [id number] "Transfer of Electronic Prescriptions for Schedules II-V Controlled Substances Between Pharmacies for Initial Filling"  ____________________________________________________________________________________________     _______________________________________________________________________  Medication Rules  Purpose: To inform patients, and their family members, of our medication rules and regulations.  Applies to: All patients receiving prescriptions from our practice (written or electronic).  Pharmacy of record: This is the pharmacy where your electronic prescriptions will be sent. Make sure we have the correct one.  Electronic prescriptions: In  compliance with the [place] (STOP) Act of 2017 (Session Lanny Cramp [phone]), effective January 21, 2018, all controlled substances must be electronically prescribed. Written prescriptions, faxing, or calling prescriptions to a pharmacy will no longer be done.  Prescription refills: These will be provided only during in-person appointments. No medications will be renewed without a "face-to-face" evaluation with your provider. Applies to all prescriptions.  NOTE: The following applies primarily to controlled substances (Opioid* Pain Medications).   Type of encounter (visit): For patients receiving controlled substances, face-to-face visits are required. (Not an option and not up to the patient.)  Patient's responsibilities: Pain Pills: Bring all pain pills to every appointment (except for procedure appointments). Pill Bottles: Bring pills in original pharmacy bottle. Bring bottle, even if empty. Always bring the bottle of the most recent fill.  Medication refills: You are responsible for knowing and keeping track of what medications you are taking and when is it that you will need a refill. The day before your appointment: write a list of all prescriptions that need to be refilled. The day of the appointment: give the list to the admitting nurse. Prescriptions will be written only during appointments. No prescriptions will be written on procedure days. If you forget a medication: it will not be "Called in", "Faxed", or "electronically sent". You will need to get another appointment to get these prescribed. No early refills. Do not call asking to have your prescription filled early. Partial  or short prescriptions: Occasionally your pharmacy may not have enough pills to fill your prescription.  NEVER ACCEPT a partial fill or a prescription that is short of the total amount of pills that you were prescribed.  With controlled substances the law allows 72 hours for  the pharmacy to complete the prescription.  If the prescription is not completed within 72 hours, the pharmacist will require a new prescription to be written. This means that you will be short on your medicine and we WILL NOT send another prescription to complete your original  prescription.  Instead, request the pharmacy to send a carrier to a nearby branch to get enough medication to provide you with your full prescription. Prescription Accuracy: You are responsible for carefully inspecting your prescriptions before leaving our office. Have the discharge nurse carefully go over each prescription with you, before taking them home. Make sure that your name is accurately spelled, that your address is correct. Check the name and dose of your medication to make sure it is accurate. Check the number of pills, and the written instructions to make sure they are clear and accurate. Make sure that you are given enough medication to last until your next medication refill appointment. Taking Medication: Take medication as prescribed. When it comes to controlled substances, taking less pills or less frequently than prescribed is permitted and encouraged. Never take more pills than instructed. Never take the medication more frequently than prescribed.  Inform other Doctors: Always inform, all of your healthcare providers, of all the medications you take. Pain Medication from other Providers: You are not allowed to accept any additional pain medication from any other Doctor or Healthcare provider. There are two exceptions to this rule. (see below) In the event that you require additional pain medication, you are responsible for notifying us, as stated below. Cough Medicine: Often these contain an opioid, such as codeine or hydrocodone. Never accept or take cough medicine containing these opioids if you are already taking an opioid* medication. The combination may cause respiratory failure and death. Medication Agreement:  You are responsible for carefully reading and following our Medication Agreement. This must be signed before receiving any prescriptions from our practice. Safely store a copy of your signed Agreement. Violations to the Agreement will result in no further prescriptions. (Additional copies of our Medication Agreement are available upon request.) Laws, Rules, & Regulations: All patients are expected to follow all Federal and [employer], [employer], Rules, [facility]. Ignorance of the Laws does not constitute a valid excuse.  Illegal drugs and Controlled Substances: The use of illegal substances (including, but not limited to marijuana and its derivatives) and/or the illegal use of any controlled substances is strictly prohibited. Violation of this rule may result in the immediate and permanent discontinuation of any and all prescriptions being written by our practice. The use of any illegal substances is prohibited. Adopted CDC guidelines & recommendations: Target dosing levels will be at or below 60 MME/day. Use of benzodiazepines** is not recommended.  Exceptions: There are only two exceptions to the rule of not receiving pain medications from other Healthcare Providers. Exception #1 (Emergencies): In the event of an emergency (i.e.: accident requiring emergency care), you are allowed to receive additional pain medication. However, you are responsible for: As soon as you are able, call our office [phone], at any time of the day or night, and leave a message stating your name, the date and nature of the emergency, and the name and dose of the medication prescribed. In the event that your call is answered by a member of our staff, make sure to document and save the date, time, and the name of the person that took your information.  Exception #2 (Planned Surgery): In the event that you are scheduled by another doctor or dentist to have any type of surgery or procedure, you are allowed (for a period no  longer than 30 days), to receive additional pain medication, for the acute post-op pain. However, in this case, you are responsible for picking up a copy of  our "Post-op Pain Management for Surgeons" handout, and giving it to your surgeon or dentist. This document is available at our office, and does not require an appointment to obtain it. Simply go to our office during business hours (Monday-Thursday from 8:00 AM to 4:00 PM) (Friday 8:00 AM to 12:00 Noon) or if you have a scheduled appointment with [place], prior to your surgery, and ask for it by name. In addition, you are responsible for: calling our office [phone], at any time of the day or night, and leaving a message stating your name, name of your surgeon, type of surgery, and date of procedure or surgery. Failure to comply with your responsibilities may result in termination of therapy involving the controlled substances. Medication Agreement Violation. Following the above rules, including your responsibilities will help you in avoiding a Medication Agreement Violation ("Breaking your Pain Medication Contract").  Consequences:  Not following the above rules may result in permanent discontinuation of medication prescription therapy.  *Opioid medications include: morphine, codeine, oxycodone, oxymorphone, hydrocodone, hydromorphone, meperidine, tramadol, tapentadol, buprenorphine, fentanyl, methadone. **Benzodiazepine medications include: diazepam (Valium), alprazolam (Xanax), clonazepam (Klonopine), lorazepam (Ativan), clorazepate (Tranxene), chlordiazepoxide (Librium), estazolam (Prosom), oxazepam (Serax), temazepam (Restoril), triazolam (Halcion) (Last updated: 11/13/2021) ______________________________________________________________________    ______________________________________________________________________  Medication Recommendations and Reminders  Applies to: All patients receiving prescriptions (written and/or  electronic).  Medication Rules & Regulations: You are responsible for reading, knowing, and following our "Medication Rules" document. These exist for your safety and that of others. They are not flexible and neither are we. Dismissing or ignoring them is an act of "non-compliance" that may result in complete and irreversible termination of such medication therapy. For safety reasons, "non-compliance" will not be tolerated. As with the [place] fundamental legal principle of "ignorance of the law is no defense", we will accept no excuses for not having read and knowing the content of documents provided to you by our practice.  Pharmacy of record:  Definition: This is the pharmacy where your electronic prescriptions will be sent.  We do not endorse any particular pharmacy. It is up to you and your insurance to decide what pharmacy to use.  We do not restrict you in your choice of pharmacy. However, once we write for your prescriptions, we will NOT be re-sending more prescriptions to fix restricted supply problems created by your pharmacy, or your insurance.  The pharmacy listed in the electronic medical record should be the one where you want electronic prescriptions to be sent. If you choose to change pharmacy, simply notify our nursing staff. Changes will be made only during your regular appointments and not over the phone.  Recommendations: Keep all of your pain medications in a safe place, under lock and key, even if you live alone. We will NOT replace lost, stolen, or damaged medication. We do not accept "Police Reports" as proof of medications having been stolen. After you fill your prescription, take 1 week's worth of pills and put them away in a safe place. You should keep a separate, properly labeled bottle for this purpose. The remainder should be kept in the original bottle. Use this as your primary supply, until it runs out. Once it's gone, then you know that you have 1 week's worth of medicine,  and it is time to come in for a prescription refill. If you do this correctly, it is unlikely that you will ever run out of medicine. To make sure that the above recommendation works, it is very important that you make  sure your medication refill appointments are scheduled at least 1 week before you run out of medicine. To do this in an effective manner, make sure that you do not leave the office without scheduling your next medication management appointment. Always ask the nursing staff to show you in your prescription , when your medication will be running out. Then arrange for the receptionist to get you a return appointment, at least 7 days before you run out of medicine. Do not wait until you have 1 or 2 pills left, to come in. This is very poor planning and does not take into consideration that we may need to cancel appointments due to bad weather, sickness, or emergencies affecting our staff. DO NOT ACCEPT A "Partial Fill": If for any reason your pharmacy does not have enough pills/tablets to completely fill or refill your prescription, do not allow for a "partial fill". The law allows the pharmacy to complete that prescription within 72 hours, without requiring a new prescription. If they do not fill the rest of your prescription within those 72 hours, you will need a separate prescription to fill the remaining amount, which we will NOT provide. If the reason for the partial fill is your insurance, you will need to talk to the pharmacist about payment alternatives for the remaining tablets, but again, DO NOT ACCEPT A PARTIAL FILL, unless you can trust your pharmacist to obtain the remainder of the pills within 72 hours.  Prescription refills and/or changes in medication(s):  Prescription refills, and/or changes in dose or medication, will be conducted only during scheduled medication management appointments. (Applies to both, written and electronic prescriptions.) No refills on procedure days. No  medication will be changed or started on procedure days. No changes, adjustments, and/or refills will be conducted on a procedure day. Doing so will interfere with the diagnostic portion of the procedure. No phone refills. No medications will be "called into the pharmacy". No Fax refills. No weekend refills. No Holliday refills. No after hours refills.  Remember:  Business hours are:  Monday to Thursday 8:00 AM to 4:00 PM Provider's Schedule: Milinda Pointer, MD - Appointments are:  Medication management: Monday and Wednesday 8:00 AM to 4:00 PM Procedure day: Tuesday and Thursday 7:30 AM to 4:00 PM Gillis Santa, MD - Appointments are:  Medication management: Tuesday and Thursday 8:00 AM to 4:00 PM Procedure day: Monday and Wednesday 7:30 AM to 4:00 PM (Last update: 11/13/2021) ______________________________________________________________________    ____________________________________________________________________________________________  Drug Holidays  What is a "Drug Holiday"? Drug Holiday: is the name given to the process of slowly tapering down and temporarily stopping the pain medication for the purpose of decreasing or eliminating tolerance to the drug.  Benefits Improved effectiveness Decreased required effective dose Improved pain control End dependence on high dose therapy Decrease cost of therapy Uncovering "opioid-induced hyperalgesia". (OIH)  What is "opioid hyperalgesia"? It is a paradoxical increase in pain caused by exposure to opioids. Stopping the opioid pain medication, contrary to the expected, it actually decreases or completely eliminates the pain. Ref.: "A comprehensive review of opioid-induced hyperalgesia". Brion Aliment, et.al. Pain Physician. 2011 Mar-Apr;14(2):145-61.  What is tolerance? Tolerance: the progressive loss of effectiveness of a pain medicine due to repetitive use. A common problem of opioid pain medications.  How long should a "Drug  Holiday" last? Effectiveness depends on the patient staying off all opioid pain medicines for a minimum of 14 consecutive days. (2 weeks)  How about just taking less of the medicine? Does not  work. Will not accomplish goal of eliminating the excess receptors.  How about switching to a different pain medicine? (AKA. "Opioid rotation") Does not work. Creates the illusion of effectiveness by taking advantage of inaccurate equivalent dose calculations between different opioids. -This "technique" was promoted by studies funded by [employer], such as [employer], creators of "OxyContin".  Can I stop the medicine "cold [place]"? Depends. You should always coordinate with your Pain Specialist to make the transition as smoothly as possible. Avoid stopping the medicine abruptly without consulting. We recommend a "slow taper".  What is a slow taper? Taper: refers to the gradual decrease in dose.   How do I stop/taper the dose? Slowly. Decrease the daily amount of pills that you take by one (1) pill every seven (7) days. This is called a "slow downward taper". Example: if you normally take four (4) pills per day, drop it to three (3) pills per day for seven (7) days, then to two (2) pills per day for seven (7) days, then to one (1) per day for seven (7) days, and then stop the medicine. The 14 day "Drug Holiday" starts on the first day without medicine.   Will I experience withdrawals? Unlikely with a slow taper.  What triggers withdrawals? Withdrawals are triggered by the sudden/abrupt stop of high dose opioids. Withdrawals can be avoided by slowly decreasing the dose over a prolonged period of time.  What are withdrawals? Symptoms associated with sudden/abrupt reduction/stopping of high-dose, long-term use of pain medication. Withdrawal are seldom seen on low dose therapy, or patients rarely taking opioid medication.  Early Withdrawal Symptoms may include: Agitation Anxiety Muscle  aches Increased tearing Insomnia Runny nose Sweating Yawning  Late symptoms may include: Abdominal cramping Diarrhea Dilated pupils Goose bumps Nausea Vomiting  (Last update: 12/30/2021) ____________________________________________________________________________________________    ____________________________________________________________________________________________  WARNING: CBD (cannabidiol) & Delta (Delta-8 tetrahydrocannabinol) products.   Applicable to:  All individuals currently taking or considering taking CBD (cannabidiol) and, more important, all patients taking opioid analgesic controlled substances (pain medication). (Example: oxycodone; oxymorphone; hydrocodone; hydromorphone; morphine; methadone; tramadol; tapentadol; fentanyl; buprenorphine; butorphanol; dextromethorphan; meperidine; codeine; etc.)  Introduction:  Recently there has been a drive towards the use of "natural" products for the treatment of different conditions, including pain anxiety and sleep disorders. Marijuana and hemp are two varieties of the cannabis genus plants. Marijuana and its derivatives are illegal, while hemp and its derivatives are not. Cannabidiol (CBD) and tetrahydrocannabinol (THC), are two natural compounds found in plants of the Cannabis genus. They can both be extracted from hemp or marijuana. Both compounds interact with your body's endocannabinoid system in very different ways. CBD is associated with pain relief (analgesia) while THC is associated with the psychoactive effects ("the high") obtained from the use of marijuana products. There are two main types of THC: Delta-9, which comes from the marijuana plant and it is illegal, and Delta-8, which comes from the hemp plant, and it is legal. (Both, Delta-9-THC and Delta-8-THC are psychoactive and give you "the high".)   Legality:  Marijuana and its derivatives: illegal Hemp and its derivatives: Legal (State dependent) UPDATE:  (03/09/2021) The Drug Enforcement Agency ([place]) issued a letter stating that "delta" cannabinoids, including Delta-8-THCO and Delta-9-THCO, synthetically derived from hemp do not qualify as hemp and will be viewed as Schedule I drugs. (Schedule I drugs, substances, or chemicals are defined as drugs with no currently accepted medical use and a high potential for abuse. Some examples of Schedule I drugs are: heroin,  lysergic acid diethylamide (LSD), marijuana (cannabis), 3,4-methylenedioxymethamphetamine (ecstasy), methaqualone, and peyote.) (https://jennings.com/)  Legal status of CBD in [place]:  "Conditionally Legal"  Reference: "FDA Regulation of Cannabis and Cannabis-Derived Products, Including Cannabidiol (CBD)" - SeekArtists.com.pt  Warning:  CBD is not FDA approved and has not undergo the same manufacturing controls as prescription drugs.  This means that the purity and safety of available CBD may be questionable. Most of the time, despite manufacturer's claims, it is contaminated with THC (delta-9-tetrahydrocannabinol - the chemical in marijuana responsible for the "HIGH").  When this is the case, the [facility] contaminant will trigger a positive urine drug screen (UDS) test for Marijuana (carboxy-THC).   The FDA recently put out a warning about 5 things that everyone should be aware of regarding Delta-8 THC: Delta-8 THC products have not been evaluated or approved by the FDA for safe use and may be marketed in ways that put the public health at risk. The FDA has received adverse event reports involving delta-8 THC-containing products. Delta-8 THC has psychoactive and intoxicating effects. Delta-8 THC manufacturing often involve use of potentially harmful chemicals to create the concentrations of delta-8 THC claimed in the marketplace. The final delta-8 THC product may have potentially harmful  by-products (contaminants) due to the chemicals used in the process. Manufacturing of delta-8 THC products may occur in uncontrolled or unsanitary settings, which may lead to the presence of unsafe contaminants or other potentially harmful substances. Delta-8 THC products should be kept out of the reach of children and pets.  NOTE: Because a positive UDS for any illicit substance is a violation of our medication agreement, your opioid analgesics (pain medicine) may be permanently discontinued.  MORE ABOUT CBD  General Information: CBD was discovered in [birth year] and it is a derivative of the cannabis sativa genus plants (Marijuana and Hemp). It is one of the 113 identified substances found in Marijuana. It accounts for up to 40% of the plant's extract. As of 2018, preliminary clinical studies on CBD included research for the treatment of anxiety, movement disorders, and pain. CBD is available and consumed in multiple forms, including inhalation of smoke or vapor, as an aerosol spray, and by mouth. It may be supplied as an oil containing CBD, capsules, dried cannabis, or as a liquid solution. CBD is thought not to be as psychoactive as THC (delta-9-tetrahydrocannabinol - the chemical in marijuana responsible for the "HIGH"). Studies suggest that CBD may interact with different biological target receptors in the body, including cannabinoid and other neurotransmitter receptors. As of 2018 the mechanism of action for its biological effects has not been determined.  Side-effects  Adverse reactions: Dry mouth, diarrhea, decreased appetite, fatigue, drowsiness, malaise, weakness, sleep disturbances, and others.  Drug interactions:  CBD may interact with medications such as blood-thinners. CBD causes drowsiness on its own and it will increase drowsiness caused by other medications, including antihistamines (such as Benadryl), benzodiazepines (Xanax, Ativan, Valium), antipsychotics, antidepressants, opioids, alcohol  and supplements such as kava, melatonin and St. John's Wort.  Other drug interactions: Brivaracetam (Briviact); Caffeine; Carbamazepine (Tegretol); Citalopram (Celexa); Clobazam (Onfi); Eslicarbazepine (Aptiom); Everolimus (Zostress); Lithium; Methadone (Dolophine); Rufinamide (Banzel); Sedative medications (CNS depressants); Sirolimus (Rapamune); Stiripentol (Diacomit); Tacrolimus (Prograf); Tamoxifen ; Soltamox); Topiramate (Topamax); Valproate; Warfarin (Coumadin); Zonisamide. (Last update: 12/31/2021) ____________________________________________________________________________________________   ____________________________________________________________________________________________  Naloxone Nasal Spray  Why am I receiving this medication? [place] STOP ACT requires that all patients taking high dose opioids or at risk of opioids respiratory depression, be prescribed an opioid reversal agent, such as  Naloxone (AKA: Narcan).  What is this medication? NALOXONE (nal OX one) treats opioid overdose, which causes slow or shallow breathing, severe drowsiness, or trouble staying awake. Call emergency services after using this medication. You may need additional treatment. Naloxone works by reversing the effects of opioids. It belongs to a group of medications called opioid blockers.  COMMON BRAND NAME(S): Kloxxado, Narcan  What should I tell my care team before I take this medication? They need to know if you have any of these conditions: Heart disease Substance use disorder An unusual or allergic reaction to naloxone, other medications, foods, dyes, or preservatives Pregnant or trying to get pregnant Breast-feeding  When to use this medication? This medication is to be used for the treatment of respiratory depression (less than 8 breaths per minute) secondary to opioid overdose.   How to use this medication? This medication is for use in the nose. Lay the person on their back.  Support their neck with your hand and allow the head to tilt back before giving the medication. The nasal spray should be given into 1 nostril. After giving the medication, move the person onto their side. Do not remove or test the nasal spray until ready to use. Get emergency medical help right away after giving the first dose of this medication, even if the person wakes up. You should be familiar with how to recognize the signs and symptoms of a narcotic overdose. If more doses are needed, give the additional dose in the other nostril. Talk to your care team about the use of this medication in children. While this medication may be prescribed for children as young as newborns for selected conditions, precautions do apply.  Naloxone Overdosage: If you think you have taken too much of this medicine contact a poison control center or emergency room at once.  NOTE: This medicine is only for you. Do not share this medicine with others.  What if I miss a dose? This does not apply.  What may interact with this medication? This is only used during an emergency. No interactions are expected during emergency use. This list may not describe all possible interactions. Give your health care provider a list of all the medicines, herbs, non-prescription drugs, or dietary supplements you use. Also tell them if you smoke, drink alcohol, or use illegal drugs. Some items may interact with your medicine.  What should I watch for while using this medication? Keep this medication ready for use in the case of an opioid overdose. Make sure that you have the phone number of your care team and local hospital ready. You may need to have additional doses of this medication. Each nasal spray contains a single dose. Some emergencies may require additional doses. After use, bring the treated person to the nearest hospital or call 911. Make sure the treating care team knows that the person has received a dose of this medication.  You will receive additional instructions on what to do during and after use of this medication before an emergency occurs.  What side effects may I notice from receiving this medication? Side effects that you should report to your care team as soon as possible: Allergic reactions--skin rash, itching, hives, swelling of the face, lips, tongue, or throat Side effects that usually do not require medical attention (report these to your care team if they continue or are bothersome): Constipation Dryness or irritation inside the nose Headache Increase in blood pressure Muscle spasms Stuffy nose Toothache This list may not  describe all possible side effects. Call your doctor for medical advice about side effects. You may report side effects to FDA at 1-800-FDA-1088.  Where should I keep my medication? Because this is an emergency medication, you should keep it with you at all times.  Keep out of the reach of children and pets. Store between 20 and 25 degrees C (68 and 77 degrees F). Do not freeze. Throw away any unused medication after the expiration date. Keep in original box until ready to use.  NOTE: This sheet is a summary. It may not cover all possible information. If you have questions about this medicine, talk to your doctor, pharmacist, or health care provider.   2023 Elsevier/Gold Standard (2020-09-15 00:00:00)  ____________________________________________________________________________________________

## 2022-04-01 NOTE — Progress Notes (Signed)
Patient: Tanya [name]  Service Category: E/M  Provider: Gaspar Cola, MD  DOB: 1960-08-26  DOS: 04/01/2022  Location: Office  MRN: [id number]  Setting: Ambulatory outpatient  Referring Provider: Leonel Ramsay, MD  Type: Established Patient  Specialty: Interventional Pain Management  PCP: Leonel Ramsay, MD  Location: Remote location  Delivery: TeleHealth     Virtual Encounter - Pain Management PROVIDER NOTE: Information contained herein reflects review and annotations entered in association with encounter. Interpretation of such information and data should be left to medically-trained personnel. Information provided to patient can be located elsewhere in the medical record under "Patient Instructions". Document created using STT-dictation technology, any transcriptional errors that may result from process are unintentional.    Contact & Pharmacy Preferred: [phone] Home: [phone] (home) Mobile: [phone] (mobile) E-mail: [email or web address]  [place], [place] [address] Phone: [phone] Fax: [phone]   Pre-screening  Tanya [name] offered "in-person" vs "virtual" encounter. She indicated preferring virtual for this encounter.   Reason COVID-19*  Social distancing based on CDC and AMA recommendations.   I contacted [name] on 04/01/2022 via telephone.      I clearly identified myself as Gaspar Cola, MD. I verified that I was speaking with the correct person using two identifiers (Name: Tanya [name], and date of birth: 1960/10/04).  Consent I sought verbal advanced consent from [name] for virtual visit interactions. I informed Ms. Tenny of possible security and privacy concerns, risks, and limitations associated with providing "not-in-person" medical evaluation and management services. I also informed Ms. Chimenti of the availability of "in-person"  appointments. Finally, I informed her that there would be a charge for the virtual visit and that she could be  personally, fully or partially, financially responsible for it. Ms. Mecca expressed understanding and agreed to proceed.   Historic Elements   Ms. LARUEN WAHLSTROM is a 62 y.o. year old, female patient evaluated today after our last contact on 12/31/2021. Tanya [name]  has a past medical history of Anxiety, Bladder infection, Chronic lower back pain, Chronic neck pain, Collagen vascular disease ([place]), COVID-19, Depression, Diverticulitis, GERD (gastroesophageal reflux disease), Hyperlipidemia, Lupus ([place]), and Overactive bladder. She also  has a past surgical history that includes Abdominal hysterectomy; Colonoscopy; Colonoscopy with propofol (N/A, 07/03/2017); Tonsillectomy; Esophagogastroduodenoscopy (N/A, 10/21/2017); and Colonoscopy with propofol (N/A, 10/21/2017). Tanya [name] has a current medication list which includes the following prescription(s): atorvastatin, clobetasol cream, gabapentin, hydroxychloroquine, hydroxyzine, naloxone, nicotine, oxycodone, [START ON 04/06/2022] oxycodone, sertraline, and ventolin hfa, and the following Facility-Administered Medications: albuterol. She  reports that she has been smoking cigarettes. She has a 35.00 pack-year smoking history. She has been exposed to tobacco smoke. She has never used smokeless tobacco. She reports that she does not drink alcohol and does not use drugs. Tanya [name] is allergic to pregabalin, trazodone, bupropion, methocarbamol, and tramadol.  BMI: Estimated body mass index is 26.11 kg/m as calculated from the following:   Height as of 03/07/22: '5\' 3"'$  (1.6 m).   Weight as of 03/07/22: 147 lb 6.4 oz (66.9 kg). Last encounter: 12/31/2021. Last procedure: Visit date not found.  HPI  Today, she is being contacted for medication management.  Today the patient had to switch her appointment from a face-to-face to a virtual  visit when she was unable to find transportation for her visit today.  She describes that her car broke down and has not been able  to fix it.  She is also indicated that she was unable to get somebody to bring her in.  She understands that we can only provide her with 30 days worth of medication until she can come in to have her appropriate pill count and follow-up evaluation.  The patient indicates doing well with the current medication regimen. No adverse reactions or side effects reported to the medications.   RTCB: 05/06/2022   Pharmacotherapy Assessment   Opioid Analgesic: None MME/day: 0 mg/day.   Monitoring: [place] PMP: PDMP reviewed during this encounter.       Pharmacotherapy: No side-effects or adverse reactions reported. Compliance: No problems identified. Effectiveness: Clinically acceptable. Plan: Refer to "POC". UDS:  Summary  Date Value Ref Range Status  01/31/2021 Note  Final    Comment:    ==================================================================== [employer] Select 13 (MW) ==================================================================== Test                             Result       Flag       Units  Drug Present and Declared for Prescription Verification   Oxazepam                       187          EXPECTED   ng/mg creat    Oxazepam may be administered as a scheduled prescription medication;    it is also an expected metabolite of other benzodiazepine drugs,    including diazepam, chlordiazepoxide, prazepam, clorazepate,    halazepam, and temazepam.    Oxycodone                      972          EXPECTED   ng/mg creat   Oxymorphone                    197          EXPECTED   ng/mg creat   Noroxycodone                   500          EXPECTED   ng/mg creat    Sources of oxycodone include scheduled prescription medications.    Oxymorphone and noroxycodone are expected metabolites of oxycodone.    Oxymorphone is also available as a scheduled prescription  medication.  ==================================================================== Test                      Result    Flag   Units      Ref Range   Creatinine              39               mg/dL      >=20 ==================================================================== Declared Medications:  The flagging and interpretation on this report are based on the  following declared medications.  Unexpected results may arise from  inaccuracies in the declared medications.   **Note: The testing scope of this panel includes these medications:   Diazepam (Valium)  Oxycodone (Roxicodone)   **Note: The testing scope of this panel does not include the  following reported medications:   Albuterol (Ventolin HFA)  Atorvastatin (Lipitor)  Baclofen (Lioresal)  Buspirone (Buspar)  Dextromethorphan  Duloxetine (Cymbalta)  Gabapentin (Neurontin)  Promethazine ==================================================================== For clinical consultation, please call [phone]. ====================================================================  No results found for: "CBDTHCR", "D8THCCBX", "D9THCCBX"   Laboratory Chemistry Profile   Renal Lab Results  Component Value Date   BUN 7 06/09/2019   CREATININE 0.72 06/09/2019   BCR 15 09/03/2017   GFRAA >60 06/09/2019   GFRNONAA >60 06/09/2019    Hepatic Lab Results  Component Value Date   AST 26 06/09/2019   ALT 20 06/09/2019   ALBUMIN 4.1 06/09/2019   ALKPHOS 29 (L) 06/09/2019   LIPASE 35 06/09/2019    Electrolytes Lab Results  Component Value Date   NA 135 06/09/2019   K 4.2 06/09/2019   CL 102 06/09/2019   CALCIUM 8.4 (L) 06/09/2019   MG 1.9 09/03/2017    Bone Lab Results  Component Value Date   25OHVITD1 25 (L) 09/03/2017   25OHVITD2 2.9 09/03/2017   25OHVITD3 22 09/03/2017    Inflammation (CRP: Acute Phase) (ESR: Chronic Phase) Lab Results  Component Value Date   CRP 2 09/03/2017   ESRSEDRATE 21 09/03/2017          Note: Above Lab results reviewed.  Imaging  MM 3D SCREEN BREAST BILATERAL CLINICAL DATA:  Screening.  EXAM: DIGITAL SCREENING BILATERAL MAMMOGRAM WITH TOMOSYNTHESIS AND CAD  TECHNIQUE: Bilateral screening digital craniocaudal and mediolateral oblique mammograms were obtained. Bilateral screening digital breast tomosynthesis was performed. The images were evaluated with computer-aided detection.  COMPARISON:  Previous exam(s).  ACR Breast Density Category c: The breasts are heterogeneously dense, which may obscure small masses.  FINDINGS: There are no findings suspicious for malignancy.  IMPRESSION: No mammographic evidence of malignancy. A result letter of this screening mammogram will be mailed directly to the patient.  RECOMMENDATION: Screening mammogram in one year. (Code:[id number]-B-01Y)  BI-RADS CATEGORY  1: Negative.  Electronically Signed   By: Ileana Roup M.D.   On: 03/18/2022 10:02 MM Outside Films Mammo This examination belongs to an outside facility and is stored here for  comparison purposes only.  Contact the originating outside institution for  any associated report or interpretation. MM Outside Films Mammo This examination belongs to an outside facility and is stored here for  comparison purposes only.  Contact the originating outside institution for  any associated report or interpretation. MM Outside Films Mammo This examination belongs to an outside facility and is stored here for  comparison purposes only.  Contact the originating outside institution for  any associated report or interpretation. MM Outside Films Mammo This examination belongs to an outside facility and is stored here for  comparison purposes only.  Contact the originating outside institution for  any associated report or interpretation. MM Outside Films Mammo This examination belongs to an outside facility and is stored here for  comparison purposes only.  Contact the  originating outside institution for  any associated report or interpretation.  Assessment  Diagnoses of Cervicalgia, Chronic pain syndrome, Pharmacologic therapy, Chronic neck pain (1ry area of Pain) (Bilateral) (R>L), Cervical facet syndrome (Bilateral) (R>L), Chronic upper back pain (2ry area of Pain) (Bilateral) (R>L), Cervical spine pain, Chronic musculoskeletal pain, Chronic shoulder pain (Bilateral) (R>L), Encounter for medication management, Encounter for chronic pain management, Chronic use of opiate for therapeutic purpose, and Chronic low back pain (3ry area of Pain) (Bilateral) (R>L) w/o sciatica were pertinent to this visit.  Plan of Care  Problem-specific:  No problem-specific Assessment & Plan notes found for this encounter.  Ms. JULYA OHLSSON has a current medication list which includes the following long-term medication(s): gabapentin, oxycodone, [START ON 04/06/2022] oxycodone, sertraline, and ventolin  hfa.  Pharmacotherapy (Medications Ordered): Meds ordered this encounter  Medications   oxyCODONE (OXY IR/ROXICODONE) 5 MG immediate release tablet    Sig: Take 1 tablet (5 mg total) by mouth 2 (two) times daily as needed for severe pain. Must last 30 days    Dispense:  60 tablet    Refill:  0    DO NOT: delete (not duplicate); no partial-fill (will deny script to complete), no refill request (F/U required). DISPENSE: 1 day early if closed on fill date. WARN: No CNS-depressants within 8 hrs of med.   Orders:  No orders of the defined types were placed in this encounter.  Follow-up plan:   Return in about 5 weeks (around 05/06/2022) for Eval-day (M,W), (F2F), (MM).      Interventional Therapies  Risk  Complexity Considerations:   Estimated body mass index is 26.57 kg/m as calculated from the following:   Height as of this encounter: '5\' 3"'$  (1.6 m).   Weight as of this encounter: 150 lb (68 kg). WNL   Planned  Pending:      Under consideration:       Completed:   Therapeutic left IA shoulder joint inj. x1 (12/30/2019) (100/100/100 x 5 days/0)  Palliative right IA shoulder joint inj. x2 (10/06/2018) (100/100/50/50)  Palliative right AC joint inj. x3 (04/02/2018) (N/A)  Palliative left medial epicondyle elbow inj. x1 (04/02/2018) (N/A)  Diagnostic left GONB x1 (12/25/2017) (N/A)  Palliative right CESI x2 (02/12/2016) (N/A) Therapeutic left C7-T1 cervical ESI x1 (03/18/2016) (100/50/0/0)  Palliative bilateral cervical facet MBB x2 (06/26/2016)  Palliative right cervical facet RFA x3 (04/13/2020) (100/100/75/75)  Palliative left cervical facet RFA x3 (03/14/2020) (100/100/75/75)  Diagnostic/therapeutic midline C7-T1 TPI/MNB x1 (10/08/2021) (100/100/100/100)    Therapeutic  Palliative (PRN) options:   Palliative cervical facet MBB  Palliative cervical facet RFA     Pharmacotherapy  Nonopioids transfer 12/22/2019: Baclofen and Neurontin       Recent Visits No visits were found meeting these conditions. Showing recent visits within past 90 days and meeting all other requirements Today's Visits Date Type Provider Dept  04/01/22 Office Visit Milinda Pointer, MD [facility]  Showing today's visits and meeting all other requirements Future Appointments Date Type Provider Dept  05/01/22 Appointment Milinda Pointer, MD [facility]  Showing future appointments within next 90 days and meeting all other requirements  I discussed the assessment and treatment plan with the patient. The patient was provided an opportunity to ask questions and all were answered. The patient agreed with the plan and demonstrated an understanding of the instructions.  Patient advised to call back or seek an in-person evaluation if the symptoms or condition worsens.  Duration of encounter: 12 minutes.  Note by: Gaspar Cola, MD Date: 04/01/2022; Time: 1:06 PM

## 2022-04-16 ENCOUNTER — Encounter: Payer: Self-pay | Admitting: Psychiatry

## 2022-04-16 ENCOUNTER — Telehealth (INDEPENDENT_AMBULATORY_CARE_PROVIDER_SITE_OTHER): Payer: Medicaid Other | Admitting: Psychiatry

## 2022-04-16 DIAGNOSIS — F5101 Primary insomnia: Secondary | ICD-10-CM | POA: Diagnosis not present

## 2022-04-16 DIAGNOSIS — F3342 Major depressive disorder, recurrent, in full remission: Secondary | ICD-10-CM

## 2022-04-16 DIAGNOSIS — F411 Generalized anxiety disorder: Secondary | ICD-10-CM | POA: Diagnosis not present

## 2022-04-16 DIAGNOSIS — F17210 Nicotine dependence, cigarettes, uncomplicated: Secondary | ICD-10-CM

## 2022-04-16 DIAGNOSIS — F17200 Nicotine dependence, unspecified, uncomplicated: Secondary | ICD-10-CM

## 2022-04-16 NOTE — Progress Notes (Unsigned)
Virtual Visit via Video Note  I connected with Tanya [name] on 04/16/22 at  2:30 PM EDT by a video enabled telemedicine application and verified that I am speaking with the correct person using two identifiers.  Location Provider Location : Remote office Patient Location : Home  Participants: Patient , Provider    I discussed the limitations of evaluation and management by telemedicine and the availability of in person appointments. The patient expressed understanding and agreed to proceed.   I discussed the assessment and treatment plan with the patient. The patient was provided an opportunity to ask questions and all were answered. The patient agreed with the plan and demonstrated an understanding of the instructions.   The patient was advised to call back or seek an in-person evaluation if the symptoms worsen or if the condition fails to improve as anticipated.  Video connection was lost at less than 50% of the duration of the visit, at which time the remainder of the visit was completed through audio only      [name] OP Progress Note  04/17/2022 9:02 AM Tanya [name]  MRN:  [id number]  Chief Complaint:  Chief Complaint  Patient presents with   Follow-up   Anxiety   Depression   Medication Refill   HPI: Tanya [name] is a 62 year old Caucasian female on SSD, separated from husband, lives in [place], has a history of GAD, MDD, insomnia, chronic pain, fibromyalgia, osteoarthritis, SLE was evaluated by telemedicine today.  Patient today reports overall she is currently doing fairly well.  She reports she did not tolerate the sertraline.  She hence stopped using it.  She reports she went back on the Cymbalta since she felt she wanted to try it again.  Patient reports since being back on the Cymbalta she has been doing fairly well.  Denies any significant anxiety or depression symptoms at this time.  Patient reports she currently has a prayer partner.  That has  tremendously helped.  Her faith is her strength.  She reports overall she has a better outlook on life.  She has not been very compliant with her psychotherapy sessions however agreeable to reach out to her therapist.  Patient reports sleep is good.  Denies any suicidality, homicidality or perceptual disturbances.  Patient reports her rheumatologist recently started her on methotrexate which she takes 12.5 mg weekly.  That does seem to help as well.  Patient denies any other concerns today.  Visit Diagnosis:    ICD-10-CM   1. GAD (generalized anxiety disorder)  F41.1     2. MDD (major depressive disorder), recurrent, in full remission (Tanya [name])  F33.42     3. Primary insomnia  F51.01     4. Tobacco use disorder  F17.200       Past Psychiatric History: Reviewed past psychiatric history from progress note on 02/06/2021.  Past trials of Cymbalta-higher dosage did not tolerate, trazodone-side effect, Wellbutrin-side effect, Paxil-side effect, Prozac-side effect, Valium, Trintellix, mirtazapine, sertraline  Past Medical History:  Past Medical History:  Diagnosis Date   Anxiety    Bladder infection    8/18   Chronic lower back pain    Chronic neck pain    Collagen vascular disease ([place])    COVID-19    Depression    Diverticulitis    GERD (gastroesophageal reflux disease)    Hyperlipidemia    Lupus (HCC)    Overactive bladder     Past Surgical History:  Procedure Laterality Date   ABDOMINAL  HYSTERECTOMY     COLONOSCOPY     COLONOSCOPY WITH PROPOFOL N/A 07/03/2017   Procedure: COLONOSCOPY WITH PROPOFOL;  Surgeon: Tanya Sails, [name];  Location: [facility] ENDOSCOPY;  Service: Endoscopy;  Laterality: N/A;   COLONOSCOPY WITH PROPOFOL N/A 10/21/2017   Procedure: COLONOSCOPY WITH PROPOFOL;  Surgeon: Tanya Sails, [name];  Location: [facility] ENDOSCOPY;  Service: Endoscopy;  Laterality: N/A;   ESOPHAGOGASTRODUODENOSCOPY N/A 10/21/2017   Procedure: ESOPHAGOGASTRODUODENOSCOPY (EGD);  Surgeon:  Tanya Sails, [name];  Location[facility] ENDOSCOPY;  Service: Endoscopy;  Laterality: N/A;   TONSILLECTOMY      Family Psychiatric History: Reviewed family psychiatric history from progress note on 02/06/2021.  Family History:  Family History  Problem Relation Age of Onset   Depression Mother    Alcohol abuse Mother    Cancer Mother    Hypertension Mother    Emphysema Mother    Stroke Mother    Glaucoma Father    Heart disease Father    Diabetes Brother    Drug abuse Son     Social History: Reviewed social history from progress note on 02/06/2021. Social History   Socioeconomic History   Marital status: Married    Spouse name: Not on file   Number of children: Not on file   Years of education: Not on file   Highest education level: Not on file  Occupational History   Not on file  Tobacco Use   Smoking status: Every Day    Packs/day: 1.00    Years: 35.00    Additional pack years: 0.00    Total pack years: 35.00    Types: Cigarettes    Passive exposure: Past   Smokeless tobacco: Never   Tobacco comments:    1ppd - 03/01/2021  Vaping Use   Vaping Use: Never used  Substance and Sexual Activity   Alcohol use: No   Drug use: No   Sexual activity: Not Currently  Other Topics Concern   Not on file  Social History Narrative   Not on file   Social Determinants of Health   Financial Resource Strain: Not on file  Food Insecurity: Not on file  Transportation Needs: Not on file  Physical Activity: Not on file  Stress: Not on file  Social Connections: Not on file    Allergies:  Allergies  Allergen Reactions   Pregabalin Other (See Comments)    unknown   Trazodone Other (See Comments)    Agitation   Bupropion Anxiety   Methocarbamol Other (See Comments)    Agitation and stiffness   Tramadol Other (See Comments)    Nausea And Vomiting Reports she feels "high"    Metabolic Disorder Labs: No results found for: "HGBA1C", "MPG" No results found for:  "PROLACTIN" No results found for: "CHOL", "TRIG", "HDL", "CHOLHDL", "VLDL", "LDLCALC" Lab Results  Component Value Date   TSH 3.347 02/07/2021    Therapeutic Level Labs: No results found for: "LITHIUM" No results found for: "VALPROATE" No results found for: "CBMZ"  Current Medications: Current Outpatient Medications  Medication Sig Dispense Refill   atorvastatin (LIPITOR) 80 MG tablet Take 80 mg by mouth every evening.     clobetasol cream (TEMOVATE) [id number] % Apply 1 Application topically every 3 (three) days.     DULoxetine (CYMBALTA) 30 MG capsule Take 30 mg by mouth daily.     folic acid (FOLVITE) 1 MG tablet Take by mouth.     gabapentin (NEURONTIN) 300 MG capsule Take 1 capsule (300 mg total) by mouth  3 (three) times daily. 90 capsule 2   hydroxychloroquine (PLAQUENIL) 200 MG tablet Take 200 mg by mouth in the morning.     hydrOXYzine (VISTARIL) 25 MG capsule TAKE 1-2 CAPSULES BY MOUTH ONCE DAILY ASNEEDED FOR ANXIETY OR SLEEP 60 capsule 3   methotrexate (RHEUMATREX) 2.5 MG tablet Take 12.5 mg by mouth once a week. Every friday     naloxone (NARCAN) nasal spray 4 mg/0.1 mL Place 1 spray into the nose as needed for up to 365 doses (for opioid-induced respiratory depresssion). In case of emergency (overdose), spray once into each nostril. If no response within 3 minutes, repeat application and call [id number]. 1 each 0   nicotine (NICODERM CQ - DOSED IN MG/24 HOURS) 14 mg/24hr patch Place 1 patch (14 mg total) onto the skin daily. 28 patch 1   oxyCODONE (OXY IR/ROXICODONE) 5 MG immediate release tablet Take 1 tablet (5 mg total) by mouth 2 (two) times daily as needed for severe pain. Must last 30 days 60 tablet 0   VENTOLIN HFA 108 (90 Base) MCG/ACT inhaler INHALE 2 PUFFS INTO THE LUNGS EVERY 6 HOURS AS NEEDED FOR WHEEZING OR SHORTNESS OF BREATH 18 g 2   No current facility-administered medications for this visit.   Facility-Administered Medications Ordered in Other Visits  Medication Dose  Route Frequency Provider Last Rate Last Admin   albuterol (PROVENTIL) (2.5 MG/3ML) 0.083% nebulizer solution 2.5 mg  2.5 mg Nebulization Once Tyler Pita, [name]         Musculoskeletal: Strength & Muscle Tone:  UTA Gait & Station:  Seated Patient leans: N/A  Psychiatric Specialty Exam: Review of Systems  Musculoskeletal:  Positive for arthralgias.  Psychiatric/Behavioral:  The patient is nervous/anxious.   All other systems reviewed and are negative.   There were no vitals taken for this visit.There is no height or weight on file to calculate BMI.  General Appearance: Casual  Eye Contact:  Anxious  Speech:  Clear and Coherent  Volume:  Normal  Mood:  Anxious  Affect:  Congruent  Thought Process:  Goal Directed and Descriptions of Associations: Intact  Orientation:  Full (Time, Place, and Person)  Thought Content: Logical   Suicidal Thoughts:  No  Homicidal Thoughts:  No  Memory:  Immediate;   Fair Recent;   Fair Remote;   Fair  Judgement:  Fair  Insight:  Fair  Psychomotor Activity:  Normal  Concentration:  Concentration: Fair and Attention Span: Fair  Recall:  [employer] of Knowledge: Fair  Language: Fair  Akathisia:  No  Handed:  Right  AIMS (if indicated): not done  Assets:  Housing Microbiologist  ADL's:  Intact  Cognition: WNL  Sleep:  Fair   Screenings: [place] Visit from 06/22/2021 in [place] Total Score 0      [place] Office Visit from 03/07/2022 in [place] Office Visit from 12/05/2021 in [place] Office Visit from 10/15/2021 in [place] Office Visit from 10/03/2021 in [place] [facility] from 10/02/2021 in [place] at [facility]  Total GAD-7 Score  9 6 7 17 20       PHQ2-9    Flowsheet Row Video Visit from 04/16/2022 in [place] Office Visit from 03/07/2022 in [place]  Office Visit from 12/05/2021 in [place] Office Visit from 10/15/2021 in [place] Office Visit from 10/03/2021 in [facility]  PHQ-2 Total Score 1 6 2 2 6   PHQ-9 Total Score 2 14 6 5 18       Flowsheet Row Video Visit from 04/16/2022 in [employer] Office Visit from 03/07/2022 in [place] [facility] from 01/24/2022 in [place] at [place] No Risk No Risk No Risk        Assessment and Plan: BRIDEY EAVENSON is a 62 year old Caucasian female on disability, separated, lives in [place], has a history of depression, anxiety, chronic pain, GERD was evaluated by telemedicine today.  Patient is currently improving.  Plan as noted below.  Plan GAD-improving Cymbalta 30 mg p.o. daily Discontinue sertraline for noncompliance. Continue CBT with Ms. Christina Hussami  MDD-in remission Cymbalta 30 mg p.o. daily Continue CBT  Primary insomnia-improving Hydroxyzine 25-50 mg p.o. daily as needed for sleep Continue sleep hygiene techniques  Tobacco use disorder-improving Continue nicotine patch 14 mg per 24 hours daily.  Follow-up in clinic in 2 months or sooner if needed.   I have spent at least 18 minutes non face to face with patient today.   Collaboration of Care: Collaboration of Care: Other patient encouraged to follow up with therapist.  Patient/Guardian was advised Release of Information must be obtained prior to any record release in order to collaborate their care with an outside provider. Patient/Guardian was  advised if they have not already done so to contact the registration department to sign all necessary forms in order for [place] to release information regarding their care.   Consent: Patient/Guardian gives verbal consent for treatment and assignment of benefits for services provided during this visit. Patient/Guardian expressed understanding and agreed to proceed.   This note was generated in part or whole with voice recognition software. Voice recognition is usually quite accurate but there are transcription errors that can and very often do occur. I apologize for any typographical errors that were not detected and corrected.    Tanya Alert, [name] 04/17/2022, 9:02 AM

## 2022-04-23 ENCOUNTER — Ambulatory Visit: Payer: Medicaid Other | Admitting: Pulmonary Disease

## 2022-04-30 NOTE — Progress Notes (Unsigned)
PROVIDER NOTE: Information contained herein reflects review and annotations entered in association with encounter. Interpretation of such information and data should be left to medically-trained personnel. Information provided to patient can be located elsewhere in the medical record under "Patient Instructions". Document created using STT-dictation technology, any transcriptional errors that may result from process are unintentional.    Patient: Tanya [name]  Service Category: E/M  Provider: Oswaldo Done, MD  DOB: March 03, 1960  DOS: 05/01/2022  Referring Provider: Mick Sell, MD  MRN: [id number]  Specialty: Interventional Pain Management  PCP: Mick Sell, MD  Type: Established Patient  Setting: Ambulatory outpatient    Location: Office  Delivery: Face-to-face     HPI  [name], a 62 y.o. year old female, is here today because of her Chronic pain syndrome [G89.4]. Ms. Follman primary complain today is No chief complaint on file.  Pertinent problems: Ms. Hillstead has Chronic upper back pain (2ry area of Pain) (Bilateral) (R>L); Chronic low back pain (3ry area of Pain) (Bilateral) (R>L) w/o sciatica; Chronic neck pain (1ry area of Pain) (Bilateral) (R>L); Fibromyalgia; Osteoarthritis; Chronic pain syndrome; Chronic upper extremity pain (Right); Chronic cervical radicular pain (Right); Chronic shoulder pain (Bilateral) (R>L); Chronic musculoskeletal pain; Cervical spondylosis; Cervical facet syndrome (Bilateral) (R>L); Shoulder radicular pain (Bilateral) (R>L); Acute postoperative pain; Spondylosis without myelopathy or radiculopathy, cervical region; Cervicalgia; Occipital headache (Left); Neurogenic pain; Cervico-occipital neuralgia (Left); Chronic elbow pain (Left); Osteoarthritis of AC (acromioclavicular) joint (Right); Chronic acromioclavicular joint pain (Right); Chronic shoulder pain (Right); Epicondylitis elbow, medial (Left); Osteoarthritis of shoulders  (Bilateral) (R>L); Chronic shoulder pain (Left); Osteoarthritis of shoulder (Left); DDD (degenerative disc disease), cervical; Arthropathy of cervical facet joint (Bilateral); Cervical spine pain; Abnormal MRI, cervical spine (09/20/2015); Cervical central spinal stenosis (Multilevel); 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 Pain Assessment: Severity of   is reported as a  /10. Location:    / . Onset:  . Quality:  . Timing:  . Modifying factor(s):  Marland Kitchen Vitals:  vitals were not taken for this visit.  BMI: Estimated body mass index is 26.11 kg/m as calculated from the following:   Height as of 03/07/22: 5\' 3"  (1.6 m).   Weight as of 03/07/22: 147 lb 6.4 oz (66.9 kg). Last encounter: 04/01/2022. Last procedure: Visit date not found.  Reason for encounter: medication management. ***  Routine UDS ordered today.   RTCB: 08/04/2022   Pharmacotherapy Assessment  Analgesic: None MME/day: 0 mg/day.   Monitoring: [place] PMP: PDMP reviewed during this encounter.       Pharmacotherapy: No side-effects or adverse reactions reported. Compliance: No problems identified. Effectiveness: Clinically acceptable.  No notes on file  No results found for: "CBDTHCR" No results found for: "D8THCCBX" No results found for: "D9THCCBX"  UDS:  Summary  Date Value Ref Range Status  01/31/2021 Note  Final    Comment:    ==================================================================== [employer] [facility] [employer] (MW) ==================================================================== Test                             Result       Flag       Units  Drug Present and Declared for Prescription Verification   Oxazepam                       187  EXPECTED   ng/mg creat    Oxazepam may be administered as a scheduled prescription medication;    it is also an  expected metabolite of other benzodiazepine drugs,    including diazepam, chlordiazepoxide, prazepam, clorazepate,    halazepam, and temazepam.    Oxycodone                      972          EXPECTED   ng/mg creat   Oxymorphone                    197          EXPECTED   ng/mg creat   Noroxycodone                   500          EXPECTED   ng/mg creat    Sources of oxycodone include scheduled prescription medications.    Oxymorphone and noroxycodone are expected metabolites of oxycodone.    Oxymorphone is also available as a scheduled prescription medication.  ==================================================================== Test                      Result    Flag   Units      Ref Range   Creatinine              39               mg/dL      >=69 ==================================================================== Declared Medications:  The flagging and interpretation on this report are based on the  following declared medications.  Unexpected results may arise from  inaccuracies in the declared medications.   **Note: The testing scope of this panel includes these medications:   Diazepam (Valium)  Oxycodone (Roxicodone)   **Note: The testing scope of this panel does not include the  following reported medications:   Albuterol (Ventolin HFA)  Atorvastatin (Lipitor)  Baclofen (Lioresal)  Buspirone (Buspar)  Dextromethorphan  Duloxetine (Cymbalta)  Gabapentin (Neurontin)  Promethazine ==================================================================== For clinical consultation, please call [phone]. ====================================================================       ROS  Constitutional: Denies any fever or chills Gastrointestinal: No reported hemesis, hematochezia, vomiting, or acute GI distress Musculoskeletal: Denies any acute onset joint swelling, redness, loss of ROM, or weakness Neurological: No reported episodes of acute onset apraxia, aphasia,  dysarthria, agnosia, amnesia, paralysis, loss of coordination, or loss of consciousness  Medication Review  DULoxetine, albuterol, atorvastatin, clobetasol cream, folic acid, gabapentin, hydrOXYzine, hydroxychloroquine, methotrexate, naloxone, nicotine, and oxyCODONE  History Review  Allergy: Ms. Applegate is allergic to pregabalin, trazodone, bupropion, methocarbamol, and tramadol. Drug: Ms. Song  reports no history of drug use. Alcohol:  reports no history of alcohol use. Tobacco:  reports that she has been smoking cigarettes. She has a 35.00 pack-year smoking history. She has been exposed to tobacco smoke. She has never used smokeless tobacco. Social: Ms. Urbani  reports that she has been smoking cigarettes. She has a 35.00 pack-year smoking history. She has been exposed to tobacco smoke. She has never used smokeless tobacco. She reports that she does not drink alcohol and does not use drugs. Medical:  has a past medical history of Anxiety, Bladder infection, Chronic lower back pain, Chronic neck pain, Collagen vascular disease (HCC), COVID-19, Depression, Diverticulitis, GERD (gastroesophageal reflux disease), Hyperlipidemia, Lupus (HCC), and Overactive bladder. Surgical: Ms. Owen  has a past surgical history that includes Abdominal hysterectomy;  Colonoscopy; Colonoscopy with propofol (N/A, 07/03/2017); Tonsillectomy; Esophagogastroduodenoscopy (N/A, 10/21/2017); and Colonoscopy with propofol (N/A, 10/21/2017). Family: family history includes Alcohol abuse in her mother; Cancer in her mother; Depression in her mother; Diabetes in her brother; Drug abuse in her son; Emphysema in her mother; Glaucoma in her father; Heart disease in her father; Hypertension in her mother; Stroke in her mother.  Laboratory Chemistry Profile   Renal Lab Results  Component Value Date   BUN 7 06/09/2019   CREATININE 0.72 06/09/2019   BCR 15 09/03/2017   GFRAA >60 06/09/2019   GFRNONAA >60  06/09/2019    Hepatic Lab Results  Component Value Date   AST 26 06/09/2019   ALT 20 06/09/2019   ALBUMIN 4.1 06/09/2019   ALKPHOS 29 (L) 06/09/2019   LIPASE 35 06/09/2019    Electrolytes Lab Results  Component Value Date   NA 135 06/09/2019   K 4.2 06/09/2019   CL 102 06/09/2019   CALCIUM 8.4 (L) 06/09/2019   MG 1.9 09/03/2017    Bone Lab Results  Component Value Date   25OHVITD1 25 (L) 09/03/2017   25OHVITD2 2.9 09/03/2017   25OHVITD3 22 09/03/2017    Inflammation (CRP: Acute Phase) (ESR: Chronic Phase) Lab Results  Component Value Date   CRP 2 09/03/2017   ESRSEDRATE 21 09/03/2017         Note: Above Lab results reviewed.  Recent Imaging Review  MM 3D SCREEN BREAST BILATERAL CLINICAL DATA:  Screening.  EXAM: DIGITAL SCREENING BILATERAL MAMMOGRAM WITH TOMOSYNTHESIS AND CAD  TECHNIQUE: Bilateral screening digital craniocaudal and mediolateral oblique mammograms were obtained. Bilateral screening digital breast tomosynthesis was performed. The images were evaluated with computer-aided detection.  COMPARISON:  Previous exam(s).  ACR Breast Density Category c: The breasts are heterogeneously dense, which may obscure small masses.  FINDINGS: There are no findings suspicious for malignancy.  IMPRESSION: No mammographic evidence of malignancy. A result letter of this screening mammogram will be mailed directly to the patient.  RECOMMENDATION: Screening mammogram in one year. (Code:SM-B-01Y)  BI-RADS CATEGORY  1: Negative.  Electronically Signed   By: Sherron AlesLaura  Parra M.D.   On: 03/18/2022 10:02 MM Outside Films Mammo This examination belongs to an outside facility and is stored here for  comparison purposes only.  Contact the originating outside institution for  any associated report or interpretation. MM Outside Films Mammo This examination belongs to an outside facility and is stored here for  comparison purposes only.  Contact the originating  outside institution for  any associated report or interpretation. MM Outside Films Mammo This examination belongs to an outside facility and is stored here for  comparison purposes only.  Contact the originating outside institution for  any associated report or interpretation. MM Outside Films Mammo This examination belongs to an outside facility and is stored here for  comparison purposes only.  Contact the originating outside institution for  any associated report or interpretation. MM Outside Films Mammo This examination belongs to an outside facility and is stored here for  comparison purposes only.  Contact the originating outside institution for  any associated report or interpretation. Note: Reviewed        Physical Exam  General appearance: Well nourished, well developed, and well hydrated. In no apparent acute distress Mental status: Alert, oriented x 3 (person, place, & time)       Respiratory: No evidence of acute respiratory distress Eyes: PERLA Vitals: There were no vitals taken for this visit. BMI: Estimated body mass index  is 26.11 kg/m as calculated from the following:   Height as of 03/07/22: 5\' 3"  (1.6 m).   Weight as of 03/07/22: 147 lb 6.4 oz (66.9 kg). Ideal: Patient weight not recorded  Assessment   Diagnosis Status  1. Chronic pain syndrome   2. Chronic neck pain (1ry area of Pain) (Bilateral) (R>L)   3. Chronic upper back pain (2ry area of Pain) (Bilateral) (R>L)   4. Chronic low back pain (3ry area of Pain) (Bilateral) (R>L) w/o sciatica   5. Cervical spine pain   6. Cervicalgia   7. Cervical facet syndrome (Bilateral) (R>L)   8. Chronic shoulder pain (Bilateral) (R>L)   9. Chronic musculoskeletal pain   10. Pharmacologic therapy   11. Chronic use of opiate for therapeutic purpose   12. Encounter for medication management   13. Encounter for chronic pain management    Controlled Controlled Controlled   Updated Problems: No problems  updated.  Plan of Care  Problem-specific:  No problem-specific Assessment & Plan notes found for this encounter.  Ms. AMARIYANNA KASSEM has a current medication list which includes the following long-term medication(s): gabapentin, oxycodone, and ventolin hfa.  Pharmacotherapy (Medications Ordered): No orders of the defined types were placed in this encounter.  Orders:  No orders of the defined types were placed in this encounter.  Follow-up plan:   No follow-ups on file.      Interventional Therapies  Risk  Complexity Considerations:   Estimated body mass index is 26.57 kg/m as calculated from the following:   Height as of this encounter: 5\' 3"  (1.6 m).   Weight as of this encounter: 150 lb (68 kg). WNL   Planned  Pending:      Under consideration:      Completed:   Therapeutic left IA shoulder joint inj. x1 (12/30/2019) (100/100/100 x 5 days/0)  Palliative right IA shoulder joint inj. x2 (10/06/2018) (100/100/50/50)  Palliative right AC joint inj. x3 (04/02/2018) (N/A)  Palliative left medial epicondyle elbow inj. x1 (04/02/2018) (N/A)  Diagnostic left GONB x1 (12/25/2017) (N/A)  Palliative right CESI x2 (02/12/2016) (N/A) Therapeutic left C7-T1 cervical ESI x1 (03/18/2016) (100/50/0/0)  Palliative bilateral cervical facet MBB x2 (06/26/2016)  Palliative right cervical facet RFA x3 (04/13/2020) (100/100/75/75)  Palliative left cervical facet RFA x3 (03/14/2020) (100/100/75/75)  Diagnostic/therapeutic midline C7-T1 TPI/MNB x1 (10/08/2021) (100/100/100/100)    Therapeutic  Palliative (PRN) options:   Palliative cervical facet MBB  Palliative cervical facet RFA     Pharmacotherapy  Nonopioids transfer 12/22/2019: Baclofen and Neurontin        Recent Visits Date Type Provider Dept  04/01/22 Office Visit Delano Metz, MD [facility]  Showing recent visits within past 90 days and meeting all other requirements Future Appointments Date Type  Provider Dept  05/01/22 Appointment Delano Metz, MD [facility]  Showing future appointments within next 90 days and meeting all other requirements  I discussed the assessment and treatment plan with the patient. The patient was provided an opportunity to ask questions and all were answered. The patient agreed with the plan and demonstrated an understanding of the instructions.  Patient advised to call back or seek an in-person evaluation if the symptoms or condition worsens.  Duration of encounter: *** minutes.  Total time on encounter, as per AMA guidelines included both the face-to-face and non-face-to-face time personally spent by the physician and/or other qualified health care professional(s) on the day of the encounter (includes time in activities that require the  physician or other qualified health care professional and does not include time in activities normally performed by clinical staff). Physician's time may include the following activities when performed: Preparing to see the patient (e.g., pre-charting review of records, searching for previously ordered imaging, lab work, and nerve conduction tests) Review of prior analgesic pharmacotherapies. Reviewing PMP Interpreting ordered tests (e.g., lab work, imaging, nerve conduction tests) Performing post-procedure evaluations, including interpretation of diagnostic procedures Obtaining and/or reviewing separately obtained history Performing a medically appropriate examination and/or evaluation Counseling and educating the patient/family/caregiver Ordering medications, tests, or procedures Referring and communicating with other health care professionals (when not separately reported) Documenting clinical information in the electronic or other health record Independently interpreting results (not separately reported) and communicating results to the patient/ family/caregiver Care coordination (not separately  reported)  Note by: Oswaldo Done, MD Date: 05/01/2022; Time: 4:27 PM

## 2022-04-30 NOTE — Patient Instructions (Incomplete)
____________________________________________________________________________________________  Opioid Pain Medication Update  To: All patients taking opioid pain medications. (I.e.: hydrocodone, hydromorphone, oxycodone, oxymorphone, morphine, codeine, methadone, tapentadol, tramadol, buprenorphine, fentanyl, etc.)  Re: Updated review of side effects and adverse reactions of opioid analgesics, as well as new information about long term effects of this class of medications.  Direct risks of long-term opioid therapy are not limited to opioid addiction and overdose. Potential medical risks include serious fractures, breathing problems during sleep, hyperalgesia, immunosuppression, chronic constipation, bowel obstruction, myocardial infarction, and tooth decay secondary to xerostomia.  Unpredictable adverse effects that can occur even if you take your medication correctly: Cognitive impairment, respiratory depression, and death. Most people think that if they take their medication "correctly", and "as instructed", that they will be safe. Nothing could be farther from the truth. In reality, a significant amount of recorded deaths associated with the use of opioids has occurred in individuals that had taken the medication for a long time, and were taking their medication correctly. The following are examples of how this can happen: Patient taking his/her medication for a long time, as instructed, without any side effects, is given a certain antibiotic or another unrelated medication, which in turn triggers a "Drug-to-drug interaction" leading to disorientation, cognitive impairment, impaired reflexes, respiratory depression or an untoward event leading to serious bodily harm or injury, including death.  Patient taking his/her medication for a long time, as instructed, without any side effects, develops an acute impairment of liver and/or kidney function. This will lead to a rapid inability of the body to  breakdown and eliminate their pain medication, which will result in effects similar to an "overdose", but with the same medicine and dose that they had always taken. This again may lead to disorientation, cognitive impairment, impaired reflexes, respiratory depression or an untoward event leading to serious bodily harm or injury, including death.  A similar problem will occur with patients as they grow older and their liver and kidney function begins to decrease as part of the aging process.  Background information: Historically, the original case for using long-term opioid therapy to treat chronic noncancer pain was based on safety assumptions that subsequent experience has called into question. In 1996, the American Pain Society and the American Academy of Pain Medicine issued a consensus statement supporting long-term opioid therapy. This statement acknowledged the dangers of opioid prescribing but concluded that the risk for addiction was low; respiratory depression induced by opioids was short-lived, occurred mainly in opioid-naive patients, and was antagonized by pain; tolerance was not a common problem; and efforts to control diversion should not constrain opioid prescribing. This has now proven to be wrong. Experience regarding the risks for opioid addiction, misuse, and overdose in community practice has failed to support these assumptions.  According to the Centers for Disease Control and Prevention, fatal overdoses involving opioid analgesics have increased sharply over the past decade. Currently, more than 96,700 people die from drug overdoses every year. Opioids are a factor in 7 out of every 10 overdose deaths. Deaths from drug overdose have surpassed motor vehicle accidents as the leading cause of death for individuals between the ages of 35 and 54.  Clinical data suggest that neuroendocrine dysfunction may be very common in both men and women, potentially causing hypogonadism, erectile  dysfunction, infertility, decreased libido, osteoporosis, and depression. Recent studies linked higher opioid dose to increased opioid-related mortality. Controlled observational studies reported that long-term opioid therapy may be associated with increased risk for cardiovascular events. Subsequent meta-analysis concluded   that the safety of long-term opioid therapy in elderly patients has not been proven.   Side Effects and adverse reactions: Common side effects: Drowsiness (sedation). Dizziness. Nausea and vomiting. Constipation. Physical dependence -- Dependence often manifests with withdrawal symptoms when opioids are discontinued or decreased. Tolerance -- As you take repeated doses of opioids, you require increased medication to experience the same effect of pain relief. Respiratory depression -- This can occur in healthy people, especially with higher doses. However, people with COPD, asthma or other lung conditions may be even more susceptible to fatal respiratory impairment.  Uncommon side effects: An increased sensitivity to feeling pain and extreme response to pain (hyperalgesia). Chronic use of opioids can lead to this. Delayed gastric emptying (the process by which the contents of your stomach are moved into your small intestine). Muscle rigidity. Immune system and hormonal dysfunction. Quick, involuntary muscle jerks (myoclonus). Arrhythmia. Itchy skin (pruritus). Dry mouth (xerostomia).  Long-term side effects: Chronic constipation. Sleep-disordered breathing (SDB). Increased risk of bone fractures. Hypothalamic-pituitary-adrenal dysregulation. Increased risk of overdose.  RISKS: Fractures and Falls:  Opioids increase the risk and incidence of falls. This is of particular importance in elderly patients.  Endocrine System:  Long-term administration is associated with endocrine abnormalities (endocrinopathies). (Also known as Opioid-induced Endocrinopathy) Influences  on both the hypothalamic-pituitary-adrenal axis?and the hypothalamic-pituitary-gonadal axis have been demonstrated with consequent hypogonadism and adrenal insufficiency in both sexes. Hypogonadism and decreased levels of dehydroepiandrosterone sulfate have been reported in men and women. Endocrine effects include: Amenorrhoea in women (abnormal absence of menstruation) Reduced libido in both sexes Decreased sexual function Erectile dysfunction in men Hypogonadisms (decreased testicular function with shrinkage of testicles) Infertility Depression and fatigue Loss of muscle mass Anxiety Depression Immune suppression Hyperalgesia Weight gain Anemia Osteoporosis Patients (particularly women of childbearing age) should avoid opioids. There is insufficient evidence to recommend routine monitoring of asymptomatic patients taking opioids in the long-term for hormonal deficiencies.  Immune System: Human studies have demonstrated that opioids have an immunomodulating effect. These effects are mediated via opioid receptors both on immune effector cells and in the central nervous system. Opioids have been demonstrated to have adverse effects on antimicrobial response and anti-tumour surveillance. Buprenorphine has been demonstrated to have no impact on immune function.  Opioid Induced Hyperalgesia: Human studies have demonstrated that prolonged use of opioids can lead to a state of abnormal pain sensitivity, sometimes called opioid induced hyperalgesia (OIH). Opioid induced hyperalgesia is not usually seen in the absence of tolerance to opioid analgesia. Clinically, hyperalgesia may be diagnosed if the patient on long-term opioid therapy presents with increased pain. This might be qualitatively and anatomically distinct from pain related to disease progression or to breakthrough pain resulting from development of opioid tolerance. Pain associated with hyperalgesia tends to be more diffuse than the  pre-existing pain and less defined in quality. Management of opioid induced hyperalgesia requires opioid dose reduction.  Cancer: Chronic opioid therapy has been associated with an increased risk of cancer among noncancer patients with chronic pain. This association was more evident in chronic strong opioid users. Chronic opioid consumption causes significant pathological changes in the small intestine and colon. Epidemiological studies have found that there is a link between opium dependence and initiation of gastrointestinal cancers. Cancer is the second leading cause of death after cardiovascular disease. Chronic use of opioids can cause multiple conditions such as GERD, immunosuppression and renal damage as well as carcinogenic effects, which are associated with the incidence of cancers.   Mortality: Long-term opioid use   has been associated with increased mortality among patients with chronic non-cancer pain (CNCP).  Prescription of long-acting opioids for chronic noncancer pain was associated with a significantly increased risk of all-cause mortality, including deaths from causes other than overdose.  Reference: Von Korff M, Kolodny A, Deyo RA, Chou R. Long-term opioid therapy reconsidered. Ann Intern Med. 2011 Sep 6;155(5):325-8. doi: 10.7326/[id number]. PMID: 21893626; PMCID: PMC3280085. Bedson J, Chen Y, Ashworth J, Hayward RA, Dunn KM, Jordan KP. Risk of adverse events in patients prescribed long-term opioids: A cohort study in the [place] Clinical Practice Research Datalink. Eur J Pain. 2019 May;23(5):908-922. doi: 10.1002/ejp.1357. Epub 2019 Jan 31. PMID: 30620116. Colameco S, Coren JS, Ciervo CA. Continuous opioid treatment for chronic noncancer pain: a time for moderation in prescribing. Postgrad Med. 2009 Jul;121(4):61-6. doi: 10.3810/pgm.2009.07.2032. PMID: 19641271. Chou R, Turner JA, Devine EB, Hansen RN, Sullivan SD, Blazina I, Dana T, Bougatsos C, Deyo RA. The  effectiveness and risks of long-term opioid therapy for chronic pain: a systematic review for a National Institutes of Health Pathways to Prevention Workshop. Ann Intern Med. 2015 Feb 17;162(4):276-86. doi: 10.7326/M14-2559. PMID: 25581257. Warner M, Chen LH, Makuc DM. NCHS Data Brief No. 22. [place]: Centers for Disease Control and Prevention; 2009. Sep, Increase in Fatal Poisonings Involving Opioid Analgesics in the [place], 1999-2006. Song IA, Choi HR, Oh TK. Long-term opioid use and mortality in patients with chronic non-cancer pain: Ten-year follow-up study in [place] from 2010 through 2019. EClinicalMedicine. 2022 Jul 18;51:101558. doi: 10.1016/j.eclinm.2022.101558. PMID: 35875817; PMCID: PMC9304910. Huser, W., Schubert, T., Vogelmann, T. et al. All-cause mortality in patients with long-term opioid therapy compared with non-opioid analgesics for chronic non-cancer pain: a database study. BMC Med 18, 162 (2020). https://doi.org/10.1186/[email or web address] Rashidian H, Zendehdel K, Kamangar F, Malekzadeh R, Haghdoost AA. An Ecological Study of the Association between Opiate Use and Incidence of Cancers. Addict Health. 2016 Fall;8(4):252-260. PMID: 28819556; PMCID: PMC5554805.  Our Goal: Our goal is to control your pain with means other than the use of opioid pain medications.  Our Recommendation: Talk to your physician about coming off of these medications. We can assist you with the tapering down and stopping these medicines. Based on the new information, even if you cannot completely stop the medication, a decrease in the dose may be associated with a lesser risk. Ask for other means of controlling the pain. Decrease or eliminate those factors that significantly contribute to your pain such as smoking, obesity, and a diet heavily tilted towards "inflammatory" nutrients.  Last Updated: 03/20/2022    ____________________________________________________________________________________________     ____________________________________________________________________________________________  Patient Information update  To: All of our patients.  Re: Name change.  It has been made official that our current name, "[facility]"   will soon be changed to "[facility]".   The purpose of this change is to eliminate any confusion created by the concept of our practice being a "Medication Management Pain Clinic". In the past this has led to the misconception that we treat pain primarily by the use of prescription medications.  Nothing can be farther from the truth.   Understanding PAIN MANAGEMENT: To further understand what our practice does, you first have to understand that "Pain Management" is a subspecialty that requires additional training once a physician has completed their specialty training, which can be in either Anesthesia, Neurology, Psychiatry, or Physical Medicine and Rehabilitation (PMR). Each one of these contributes to the final approach taken by each physician to   the management of their patient's pain. To be a "Pain Management Specialist" you must have first completed one of the specialty trainings below.  Anesthesiologists - trained in clinical pharmacology and interventional techniques such as nerve blockade and regional as well as central neuroanatomy. They are trained to block pain before, during, and after surgical interventions.  Neurologists - trained in the diagnosis and pharmacological treatment of complex neurological conditions, such as Multiple Sclerosis, Parkinson's, spinal cord injuries, and other systemic conditions that may be associated with symptoms that may include but are not limited to pain. They tend to rely primarily on the treatment of chronic pain  using prescription medications.  Psychiatrist - trained in conditions affecting the psychosocial wellbeing of patients including but not limited to depression, anxiety, schizophrenia, personality disorders, addiction, and other substance use disorders that may be associated with chronic pain. They tend to rely primarily on the treatment of chronic pain using prescription medications.   Physical Medicine and Rehabilitation (PMR) physicians, also known as physiatrists - trained to treat a wide variety of medical conditions affecting the brain, spinal cord, nerves, bones, joints, ligaments, muscles, and tendons. Their training is primarily aimed at treating patients that have suffered injuries that have caused severe physical impairment. Their training is primarily aimed at the physical therapy and rehabilitation of those patients. They may also work alongside orthopedic surgeons or neurosurgeons using their expertise in assisting surgical patients to recover after their surgeries.  INTERVENTIONAL PAIN MANAGEMENT is sub-subspecialty of Pain Management.  Our physicians are Board-certified in Anesthesia, Pain Management, and Interventional Pain Management.  This meaning that not only have they been trained and Board-certified in their specialty of Anesthesia, and subspecialty of Pain Management, but they have also received further training in the sub-subspecialty of Interventional Pain Management, in order to become Board-certified as INTERVENTIONAL PAIN MANAGEMENT SPECIALIST.    Mission: Our goal is to use our skills in  INTERVENTIONAL PAIN MANAGEMENT as alternatives to the chronic use of prescription opioid medications for the treatment of pain. To make this more clear, we have changed our name to reflect what we do and offer. We will continue to offer medication management assessment and recommendations, but we will not be taking over any patient's medication  management.  ____________________________________________________________________________________________     ____________________________________________________________________________________________  National Pain Medication Shortage  The [place] is experiencing worsening drug shortages. These have had a negative widespread effect on patient care and treatment. Not expected to improve any time soon. Predicted to last past 2029.   Drug shortage list (generic names) Oxycodone IR Oxycodone/APAP Oxymorphone IR Hydromorphone Hydrocodone/APAP Morphine  Where is the problem?  Manufacturing and supply level.  Will this shortage affect you?  Only if you take any of the above pain medications.  How? You may be unable to fill your prescription.  Your pharmacist may offer a "partial fill" of your prescription. (Warning: Do not accept partial fills.) Prescriptions partially filled cannot be transferred to another pharmacy. Read our Medication Rules and Regulation. Depending on how much medicine you are dependent on, you may experience withdrawals when unable to get the medication.  Recommendations: Consider ending your dependence on opioid pain medications. Ask your pain specialist to assist you with the process. Consider switching to a medication currently not in shortage, such as Buprenorphine. Talk to your pain specialist about this option. Consider decreasing your pain medication requirements by managing tolerance thru "Drug Holidays". This may help minimize withdrawals, should you run out of medicine. Control your pain thru   the use of non-pharmacological interventional therapies.   Your prescriber: Prescribers cannot be blamed for shortages. Medication manufacturing and supply issues cannot be fixed by the prescriber.   NOTE: The prescriber is not responsible for supplying the medication, or solving supply issues. Work with your pharmacist to solve it. The patient is responsible for  the decision to take or continue taking the medication and for identifying and securing a legal supply source. By law, supplying the medication is the job and responsibility of the pharmacy. The prescriber is responsible for the evaluation, monitoring, and prescribing of these medications.   Prescribers will NOT: Re-issue prescriptions that have been partially filled. Re-issue prescriptions already sent to a pharmacy.  Re-send prescriptions to a different pharmacy because yours did not have your medication. Ask pharmacist to order more medicine or transfer the prescription to another pharmacy. (Read below.)  New 2023 regulation: "September 21, 2021 Revised Regulation Allows DEA-Registered Pharmacies to Transfer Electronic Prescriptions at a Patient's Request DEA Headquarters Division - Public Information Office Patients now have the ability to request their electronic prescription be transferred to another pharmacy without having to go back to their practitioner to initiate the request. This revised regulation went into effect on Monday, September 17, 2021.     At a patient's request, a DEA-registered retail pharmacy can now transfer an electronic prescription for a controlled substance (schedules II-V) to another DEA-registered retail pharmacy. Prior to this change, patients would have to go through their practitioner to cancel their prescription and have it re-issued to a different pharmacy. The process was taxing and time consuming for both patients and practitioners.    The Drug Enforcement Administration (DEA) published its intent to revise the process for transferring electronic prescriptions on December 10, 2019.  The final rule was published in the federal register on August 16, 2021 and went into effect 30 days later.  Under the final rule, a prescription can only be transferred once between pharmacies, and only if allowed under existing state or other applicable law. The prescription must  remain in its electronic form; may not be altered in any way; and the transfer must be communicated directly between two licensed pharmacists. It's important to note, any authorized refills transfer with the original prescription, which means the entire prescription will be filled at the same pharmacy".  Reference: https://www.dea.gov/stories/2023/2023-09/2021-09-01/revised-regulation-allows-dea-registered-pharmacies-transfer (DEA website announcement)  https://www.govinfo.gov/content/pkg/FR-2021-08-16/pdf/2023-15847.pdf (Federal Register  Department of Justice)   Federal Register / Vol. 88, No. 143 / Thursday, August 16, 2021 / Rules and Regulations DEPARTMENT OF JUSTICE  Drug Enforcement Administration  21 CFR Part 1306  [Docket No. [id number]]  RIN [id number]-AB64 Transfer of Electronic Prescriptions for Schedules II-V Controlled Substances Between Pharmacies for Initial Filling  ____________________________________________________________________________________________     ____________________________________________________________________________________________  Transfer of Pain Medication between Pharmacies  Re: 2023 DEA Clarification on existing regulation  Published on DEA Website: September 21, 2021  Title: Revised Regulation Allows DEA-Registered Pharmacies to Transfer Electronic Prescriptions at a Patient's Request DEA Headquarters Division - Public Information Office  "Patients now have the ability to request their electronic prescription be transferred to another pharmacy without having to go back to their practitioner to initiate the request. This revised regulation went into effect on Monday, September 17, 2021.     At a patient's request, a DEA-registered retail pharmacy can now transfer an electronic prescription for a controlled substance (schedules II-V) to another DEA-registered retail pharmacy. Prior to this change, patients would have to go through their practitioner to  cancel their prescription   and have it re-issued to a different pharmacy. The process was taxing and time consuming for both patients and practitioners.    The Drug Enforcement Administration (DEA) published its intent to revise the process for transferring electronic prescriptions on December 10, 2019.  The final rule was published in the federal register on August 16, 2021 and went into effect 30 days later.  Under the final rule, a prescription can only be transferred once between pharmacies, and only if allowed under existing state or other applicable law. The prescription must remain in its electronic form; may not be altered in any way; and the transfer must be communicated directly between two licensed pharmacists. It's important to note, any authorized refills transfer with the original prescription, which means the entire prescription will be filled at the same pharmacy."    REFERENCES: 1. DEA website announcement https://www.dea.gov/stories/2023/2023-09/2021-09-01/revised-regulation-allows-dea-registered-pharmacies-transfer  2. Department of Justice website  https://www.govinfo.gov/content/pkg/FR-2021-08-16/pdf/2023-15847.pdf  3. DEPARTMENT OF JUSTICE Drug Enforcement Administration 21 CFR Part 1306 [Docket No. [id number]] RIN [id number]-AB64 "Transfer of Electronic Prescriptions for Schedules II-V Controlled Substances Between Pharmacies for Initial Filling"  ____________________________________________________________________________________________     _______________________________________________________________________  Medication Rules  Purpose: To inform patients, and their family members, of our medication rules and regulations.  Applies to: All patients receiving prescriptions from our practice (written or electronic).  Pharmacy of record: This is the pharmacy where your electronic prescriptions will be sent. Make sure we have the correct one.  Electronic prescriptions: In  compliance with the [employer] Strengthen Opioid Misuse Prevention (STOP) Act of 2017 (Session Law 2017-74/H243), effective January 21, 2018, all controlled substances must be electronically prescribed. Written prescriptions, faxing, or calling prescriptions to a pharmacy will no longer be done.  Prescription refills: These will be provided only during in-person appointments. No medications will be renewed without a "face-to-face" evaluation with your provider. Applies to all prescriptions.  NOTE: The following applies primarily to controlled substances (Opioid* Pain Medications).   Type of encounter (visit): For patients receiving controlled substances, face-to-face visits are required. (Not an option and not up to the patient.)  Patient's responsibilities: Pain Pills: Bring all pain pills to every appointment (except for procedure appointments). Pill Bottles: Bring pills in original pharmacy bottle. Bring bottle, even if empty. Always bring the bottle of the most recent fill.  Medication refills: You are responsible for knowing and keeping track of what medications you are taking and when is it that you will need a refill. The day before your appointment: write a list of all prescriptions that need to be refilled. The day of the appointment: give the list to the admitting nurse. Prescriptions will be written only during appointments. No prescriptions will be written on procedure days. If you forget a medication: it will not be "Called in", "Faxed", or "electronically sent". You will need to get another appointment to get these prescribed. No early refills. Do not call asking to have your prescription filled early. Partial  or short prescriptions: Occasionally your pharmacy may not have enough pills to fill your prescription.  NEVER ACCEPT a partial fill or a prescription that is short of the total amount of pills that you were prescribed.  With controlled substances the law allows 72 hours for  the pharmacy to complete the prescription.  If the prescription is not completed within 72 hours, the pharmacist will require a new prescription to be written. This means that you will be short on your medicine and we WILL NOT send another prescription to complete your original   prescription.  Instead, request the pharmacy to send a carrier to a nearby branch to get enough medication to provide you with your full prescription. Prescription Accuracy: You are responsible for carefully inspecting your prescriptions before leaving our office. Have the discharge nurse carefully go over each prescription with you, before taking them home. Make sure that your name is accurately spelled, that your address is correct. Check the name and dose of your medication to make sure it is accurate. Check the number of pills, and the written instructions to make sure they are clear and accurate. Make sure that you are given enough medication to last until your next medication refill appointment. Taking Medication: Take medication as prescribed. When it comes to controlled substances, taking less pills or less frequently than prescribed is permitted and encouraged. Never take more pills than instructed. Never take the medication more frequently than prescribed.  Inform other Doctors: Always inform, all of your healthcare providers, of all the medications you take. Pain Medication from other Providers: You are not allowed to accept any additional pain medication from any other Doctor or Healthcare provider. There are two exceptions to this rule. (see below) In the event that you require additional pain medication, you are responsible for notifying us, as stated below. Cough Medicine: Often these contain an opioid, such as codeine or hydrocodone. Never accept or take cough medicine containing these opioids if you are already taking an opioid* medication. The combination may cause respiratory failure and death. Medication Agreement:  You are responsible for carefully reading and following our Medication Agreement. This must be signed before receiving any prescriptions from our practice. Safely store a copy of your signed Agreement. Violations to the Agreement will result in no further prescriptions. (Additional copies of our Medication Agreement are available upon request.) Laws, Rules, & Regulations: All patients are expected to follow all Federal and State Laws, Statutes, Rules, & Regulations. Ignorance of the Laws does not constitute a valid excuse.  Illegal drugs and Controlled Substances: The use of illegal substances (including, but not limited to marijuana and its derivatives) and/or the illegal use of any controlled substances is strictly prohibited. Violation of this rule may result in the immediate and permanent discontinuation of any and all prescriptions being written by our practice. The use of any illegal substances is prohibited. Adopted CDC guidelines & recommendations: Target dosing levels will be at or below 60 MME/day. Use of benzodiazepines** is not recommended.  Exceptions: There are only two exceptions to the rule of not receiving pain medications from other Healthcare Providers. Exception #1 (Emergencies): In the event of an emergency (i.e.: accident requiring emergency care), you are allowed to receive additional pain medication. However, you are responsible for: As soon as you are able, call our office [phone], at any time of the day or night, and leave a message stating your name, the date and nature of the emergency, and the name and dose of the medication prescribed. In the event that your call is answered by a member of our staff, make sure to document and save the date, time, and the name of the person that took your information.  Exception #2 (Planned Surgery): In the event that you are scheduled by another doctor or dentist to have any type of surgery or procedure, you are allowed (for a period no  longer than 30 days), to receive additional pain medication, for the acute post-op pain. However, in this case, you are responsible for picking up a copy of   our "Post-op Pain Management for Surgeons" handout, and giving it to your surgeon or dentist. This document is available at our office, and does not require an appointment to obtain it. Simply go to our office during business hours (Monday-Thursday from 8:00 AM to 4:00 PM) (Friday 8:00 AM to 12:00 Noon) or if you have a scheduled appointment with us, prior to your surgery, and ask for it by name. In addition, you are responsible for: calling our office [phone], at any time of the day or night, and leaving a message stating your name, name of your surgeon, type of surgery, and date of procedure or surgery. Failure to comply with your responsibilities may result in termination of therapy involving the controlled substances. Medication Agreement Violation. Following the above rules, including your responsibilities will help you in avoiding a Medication Agreement Violation ("Breaking your Pain Medication Contract").  Consequences:  Not following the above rules may result in permanent discontinuation of medication prescription therapy.  *Opioid medications include: morphine, codeine, oxycodone, oxymorphone, hydrocodone, hydromorphone, meperidine, tramadol, tapentadol, buprenorphine, fentanyl, methadone. **Benzodiazepine medications include: diazepam (Valium), alprazolam (Xanax), clonazepam (Klonopine), lorazepam (Ativan), clorazepate (Tranxene), chlordiazepoxide (Librium), estazolam (Prosom), oxazepam (Serax), temazepam (Restoril), triazolam (Halcion) (Last updated: 11/13/2021) ______________________________________________________________________    ______________________________________________________________________  Medication Recommendations and Reminders  Applies to: All patients receiving prescriptions (written and/or  electronic).  Medication Rules & Regulations: You are responsible for reading, knowing, and following our "Medication Rules" document. These exist for your safety and that of others. They are not flexible and neither are we. Dismissing or ignoring them is an act of "non-compliance" that may result in complete and irreversible termination of such medication therapy. For safety reasons, "non-compliance" will not be tolerated. As with the [place] fundamental legal principle of "ignorance of the law is no defense", we will accept no excuses for not having read and knowing the content of documents provided to you by our practice.  Pharmacy of record:  Definition: This is the pharmacy where your electronic prescriptions will be sent.  We do not endorse any particular pharmacy. It is up to you and your insurance to decide what pharmacy to use.  We do not restrict you in your choice of pharmacy. However, once we write for your prescriptions, we will NOT be re-sending more prescriptions to fix restricted supply problems created by your pharmacy, or your insurance.  The pharmacy listed in the electronic medical record should be the one where you want electronic prescriptions to be sent. If you choose to change pharmacy, simply notify our nursing staff. Changes will be made only during your regular appointments and not over the phone.  Recommendations: Keep all of your pain medications in a safe place, under lock and key, even if you live alone. We will NOT replace lost, stolen, or damaged medication. We do not accept "Police Reports" as proof of medications having been stolen. After you fill your prescription, take 1 week's worth of pills and put them away in a safe place. You should keep a separate, properly labeled bottle for this purpose. The remainder should be kept in the original bottle. Use this as your primary supply, until it runs out. Once it's gone, then you know that you have 1 week's worth of medicine,  and it is time to come in for a prescription refill. If you do this correctly, it is unlikely that you will ever run out of medicine. To make sure that the above recommendation works, it is very important that you make   sure your medication refill appointments are scheduled at least 1 week before you run out of medicine. To do this in an effective manner, make sure that you do not leave the office without scheduling your next medication management appointment. Always ask the nursing staff to show you in your prescription , when your medication will be running out. Then arrange for the receptionist to get you a return appointment, at least 7 days before you run out of medicine. Do not wait until you have 1 or 2 pills left, to come in. This is very poor planning and does not take into consideration that we may need to cancel appointments due to bad weather, sickness, or emergencies affecting our staff. DO NOT ACCEPT A "Partial Fill": If for any reason your pharmacy does not have enough pills/tablets to completely fill or refill your prescription, do not allow for a "partial fill". The law allows the pharmacy to complete that prescription within 72 hours, without requiring a new prescription. If they do not fill the rest of your prescription within those 72 hours, you will need a separate prescription to fill the remaining amount, which we will NOT provide. If the reason for the partial fill is your insurance, you will need to talk to the pharmacist about payment alternatives for the remaining tablets, but again, DO NOT ACCEPT A PARTIAL FILL, unless you can trust your pharmacist to obtain the remainder of the pills within 72 hours.  Prescription refills and/or changes in medication(s):  Prescription refills, and/or changes in dose or medication, will be conducted only during scheduled medication management appointments. (Applies to both, written and electronic prescriptions.) No refills on procedure days. No  medication will be changed or started on procedure days. No changes, adjustments, and/or refills will be conducted on a procedure day. Doing so will interfere with the diagnostic portion of the procedure. No phone refills. No medications will be "called into the pharmacy". No Fax refills. No weekend refills. No Holliday refills. No after hours refills.  Remember:  Business hours are:  Monday to Thursday 8:00 AM to 4:00 PM Provider's Schedule: Billye Pickerel, MD - Appointments are:  Medication management: Monday and Wednesday 8:00 AM to 4:00 PM Procedure day: Tuesday and Thursday 7:30 AM to 4:00 PM Bilal Lateef, MD - Appointments are:  Medication management: Tuesday and Thursday 8:00 AM to 4:00 PM Procedure day: Monday and Wednesday 7:30 AM to 4:00 PM (Last update: 11/13/2021) ______________________________________________________________________    ____________________________________________________________________________________________  Drug Holidays  What is a "Drug Holiday"? Drug Holiday: is the name given to the process of slowly tapering down and temporarily stopping the pain medication for the purpose of decreasing or eliminating tolerance to the drug.  Benefits Improved effectiveness Decreased required effective dose Improved pain control End dependence on high dose therapy Decrease cost of therapy Uncovering "opioid-induced hyperalgesia". (OIH)  What is "opioid hyperalgesia"? It is a paradoxical increase in pain caused by exposure to opioids. Stopping the opioid pain medication, contrary to the expected, it actually decreases or completely eliminates the pain. Ref.: "A comprehensive review of opioid-induced hyperalgesia". Marion Lee, et.al. Pain Physician. 2011 Mar-Apr;14(2):145-61.  What is tolerance? Tolerance: the progressive loss of effectiveness of a pain medicine due to repetitive use. A common problem of opioid pain medications.  How long should a "Drug  Holiday" last? Effectiveness depends on the patient staying off all opioid pain medicines for a minimum of 14 consecutive days. (2 weeks)  How about just taking less of the medicine? Does not   work. Will not accomplish goal of eliminating the excess receptors.  How about switching to a different pain medicine? (AKA. "Opioid rotation") Does not work. Creates the illusion of effectiveness by taking advantage of inaccurate equivalent dose calculations between different opioids. -This "technique" was promoted by studies funded by pharmaceutical companies, such as [employer], creators of "OxyContin".  Can I stop the medicine "cold turkey"? We do not recommend it. You should always coordinate with your prescribing physician to make the transition as smoothly as possible. Avoid stopping the medicine abruptly without consulting. We recommend a "slow taper".  What is a slow taper? Taper: refers to the gradual decrease in dose.   How do I stop/taper the dose? Slowly. Decrease the daily amount of pills that you take by one (1) pill every seven (7) days. This is called a "slow downward taper". Example: if you normally take four (4) pills per day, drop it to three (3) pills per day for seven (7) days, then to two (2) pills per day for seven (7) days, then to one (1) per day for seven (7) days, and then stop the medicine. The 14 day "Drug Holiday" starts on the first day without medicine.   Will I experience withdrawals? Unlikely with a slow taper.  What triggers withdrawals? Withdrawals are triggered by the sudden/abrupt stop of high dose opioids. Withdrawals can be avoided by slowly decreasing the dose over a prolonged period of time.  What are withdrawals? Symptoms associated with sudden/abrupt reduction/stopping of high-dose, long-term use of pain medication. Withdrawal are seldom seen on low dose therapy, or patients rarely taking opioid medication.  Early Withdrawal Symptoms may  include: Agitation Anxiety Muscle aches Increased tearing Insomnia Runny nose Sweating Yawning  Late symptoms may include: Abdominal cramping Diarrhea Dilated pupils Goose bumps Nausea Vomiting  When could I see withdrawals? Onset: 8-24 hours after last use for most opioids. 12-48 hours for long-acting opioids (i.e.: methadone)  How long could they last? Duration: 4-10 days for most opioids. 14-21 days for long-acting opioids (i.e.: methadone)  What will happen after I complete my "Drug Holiday"? The need and indications for the opioid analgesic will be reviewed before restarting the medication. Dose requirements will likely decrease and the dose will need to be adjusted accordingly.   (Last update: 04/10/2022) ____________________________________________________________________________________________    ____________________________________________________________________________________________  WARNING: CBD (cannabidiol) & Delta (Delta-8 tetrahydrocannabinol) products.   Applicable to:  All individuals currently taking or considering taking CBD (cannabidiol) and, more important, all patients taking opioid analgesic controlled substances (pain medication). (Example: oxycodone; oxymorphone; hydrocodone; hydromorphone; morphine; methadone; tramadol; tapentadol; fentanyl; buprenorphine; butorphanol; dextromethorphan; meperidine; codeine; etc.)  Introduction:  Recently there has been a drive towards the use of "natural" products for the treatment of different conditions, including pain anxiety and sleep disorders. Marijuana and hemp are two varieties of the cannabis genus plants. Marijuana and its derivatives are illegal, while hemp and its derivatives are not. Cannabidiol (CBD) and tetrahydrocannabinol (THC), are two natural compounds found in plants of the Cannabis genus. They can both be extracted from hemp or marijuana. Both compounds interact with your body's endocannabinoid  system in very different ways. CBD is associated with pain relief (analgesia) while THC is associated with the psychoactive effects ("the high") obtained from the use of marijuana products. There are two main types of THC: Delta-9, which comes from the marijuana plant and it is illegal, and Delta-8, which comes from the hemp plant, and it is legal. (Both, Delta-9-THC and Delta-8-THC are psychoactive and   give you "the high".)   Legality:  Marijuana and its derivatives: illegal Hemp and its derivatives: Legal (State dependent) UPDATE: (03/09/2021) The Drug Enforcement Agency (DEA) issued a letter stating that "delta" cannabinoids, including Delta-8-THCO and Delta-9-THCO, synthetically derived from hemp do not qualify as hemp and will be viewed as Schedule I drugs. (Schedule I drugs, substances, or chemicals are defined as drugs with no currently accepted medical use and a high potential for abuse. Some examples of Schedule I drugs are: heroin, lysergic acid diethylamide (LSD), marijuana (cannabis), 3,4-methylenedioxymethamphetamine (ecstasy), methaqualone, and peyote.) (https://www.dea.gov)  Legal status of CBD in [place]:  "Conditionally Legal"  Reference: "FDA Regulation of Cannabis and Cannabis-Derived Products, Including Cannabidiol (CBD)" - https://www.fda.gov/news-events/public-health-focus/fda-regulation-cannabis-and-cannabis-derived-products-including-cannabidiol-cbd  Warning:  CBD is not FDA approved and has not undergo the same manufacturing controls as prescription drugs.  This means that the purity and safety of available CBD may be questionable. Most of the time, despite manufacturer's claims, it is contaminated with THC (delta-9-tetrahydrocannabinol - the chemical in marijuana responsible for the "HIGH").  When this is the case, the THC contaminant will trigger a positive urine drug screen (UDS) test for Marijuana (carboxy-THC).   The FDA recently put out a warning about 5 things that everyone  should be aware of regarding Delta-8 THC: Delta-8 THC products have not been evaluated or approved by the FDA for safe use and may be marketed in ways that put the public health at risk. The FDA has received adverse event reports involving delta-8 THC-containing products. Delta-8 THC has psychoactive and intoxicating effects. Delta-8 THC manufacturing often involve use of potentially harmful chemicals to create the concentrations of delta-8 THC claimed in the marketplace. The final delta-8 THC product may have potentially harmful by-products (contaminants) due to the chemicals used in the process. Manufacturing of delta-8 THC products may occur in uncontrolled or unsanitary settings, which may lead to the presence of unsafe contaminants or other potentially harmful substances. Delta-8 THC products should be kept out of the reach of children and pets.  NOTE: Because a positive UDS for any illicit substance is a violation of our medication agreement, your opioid analgesics (pain medicine) may be permanently discontinued.  MORE ABOUT CBD  General Information: CBD was discovered in 1940 and it is a derivative of the cannabis sativa genus plants (Marijuana and Hemp). It is one of the 113 identified substances found in Marijuana. It accounts for up to 40% of the plant's extract. As of 2018, preliminary clinical studies on CBD included research for the treatment of anxiety, movement disorders, and pain. CBD is available and consumed in multiple forms, including inhalation of smoke or vapor, as an aerosol spray, and by mouth. It may be supplied as an oil containing CBD, capsules, dried cannabis, or as a liquid solution. CBD is thought not to be as psychoactive as THC (delta-9-tetrahydrocannabinol - the chemical in marijuana responsible for the "HIGH"). Studies suggest that CBD may interact with different biological target receptors in the body, including cannabinoid and other neurotransmitter receptors. As of  2018 the mechanism of action for its biological effects has not been determined.  Side-effects  Adverse reactions: Dry mouth, diarrhea, decreased appetite, fatigue, drowsiness, malaise, weakness, sleep disturbances, and others.  Drug interactions:  CBD may interact with medications such as blood-thinners. CBD causes drowsiness on its own and it will increase drowsiness caused by other medications, including antihistamines (such as Benadryl), benzodiazepines (Xanax, Ativan, Valium), antipsychotics, antidepressants, opioids, alcohol and supplements such as kava, melatonin and St. John's Wort.    Other drug interactions: Brivaracetam (Briviact); Caffeine; Carbamazepine (Tegretol); Citalopram (Celexa); Clobazam (Onfi); Eslicarbazepine (Aptiom); Everolimus (Zostress); Lithium; Methadone (Dolophine); Rufinamide (Banzel); Sedative medications (CNS depressants); Sirolimus (Rapamune); Stiripentol (Diacomit); Tacrolimus (Prograf); Tamoxifen ; Soltamox); Topiramate (Topamax); Valproate; Warfarin (Coumadin); Zonisamide. (Last update: 12/31/2021) ____________________________________________________________________________________________   ____________________________________________________________________________________________  Naloxone Nasal Spray  Why am I receiving this medication? [place] STOP ACT requires that all patients taking high dose opioids or at risk of opioids respiratory depression, be prescribed an opioid reversal agent, such as Naloxone (AKA: Narcan).  What is this medication? NALOXONE (nal OX one) treats opioid overdose, which causes slow or shallow breathing, severe drowsiness, or trouble staying awake. Call emergency services after using this medication. You may need additional treatment. Naloxone works by reversing the effects of opioids. It belongs to a group of medications called opioid blockers.  COMMON BRAND NAME(S): Kloxxado, Narcan  What should I tell my care team before  I take this medication? They need to know if you have any of these conditions: Heart disease Substance use disorder An unusual or allergic reaction to naloxone, other medications, foods, dyes, or preservatives Pregnant or trying to get pregnant Breast-feeding  When to use this medication? This medication is to be used for the treatment of respiratory depression (less than 8 breaths per minute) secondary to opioid overdose.   How to use this medication? This medication is for use in the nose. Lay the person on their back. Support their neck with your hand and allow the head to tilt back before giving the medication. The nasal spray should be given into 1 nostril. After giving the medication, move the person onto their side. Do not remove or test the nasal spray until ready to use. Get emergency medical help right away after giving the first dose of this medication, even if the person wakes up. You should be familiar with how to recognize the signs and symptoms of a narcotic overdose. If more doses are needed, give the additional dose in the other nostril. Talk to your care team about the use of this medication in children. While this medication may be prescribed for children as young as newborns for selected conditions, precautions do apply.  Naloxone Overdosage: If you think you have taken too much of this medicine contact a poison control center or emergency room at once.  NOTE: This medicine is only for you. Do not share this medicine with others.  What if I miss a dose? This does not apply.  What may interact with this medication? This is only used during an emergency. No interactions are expected during emergency use. This list may not describe all possible interactions. Give your health care provider a list of all the medicines, herbs, non-prescription drugs, or dietary supplements you use. Also tell them if you smoke, drink alcohol, or use illegal drugs. Some items may interact with  your medicine.  What should I watch for while using this medication? Keep this medication ready for use in the case of an opioid overdose. Make sure that you have the phone number of your care team and local hospital ready. You may need to have additional doses of this medication. Each nasal spray contains a single dose. Some emergencies may require additional doses. After use, bring the treated person to the nearest hospital or call 911. Make sure the treating care team knows that the person has received a dose of this medication. You will receive additional instructions on what to do during and after use of this   medication before an emergency occurs.  What side effects may I notice from receiving this medication? Side effects that you should report to your care team as soon as possible: Allergic reactions--skin rash, itching, hives, swelling of the face, lips, tongue, or throat Side effects that usually do not require medical attention (report these to your care team if they continue or are bothersome): Constipation Dryness or irritation inside the nose Headache Increase in blood pressure Muscle spasms Stuffy nose Toothache This list may not describe all possible side effects. Call your doctor for medical advice about side effects. You may report side effects to FDA at 1-800-FDA-1088.  Where should I keep my medication? Because this is an emergency medication, you should keep it with you at all times.  Keep out of the reach of children and pets. Store between 20 and 25 degrees C (68 and 77 degrees F). Do not freeze. Throw away any unused medication after the expiration date. Keep in original box until ready to use.  NOTE: This sheet is a summary. It may not cover all possible information. If you have questions about this medicine, talk to your doctor, pharmacist, or health care provider.   2023 [employer] Standard (2020-09-15  00:00:00)  ____________________________________________________________________________________________   

## 2022-05-01 ENCOUNTER — Encounter: Payer: Self-pay | Admitting: Pain Medicine

## 2022-05-01 ENCOUNTER — Ambulatory Visit: Payer: Medicaid Other | Attending: Pain Medicine | Admitting: Pain Medicine

## 2022-05-01 VITALS — BP 143/73 | HR 84 | Temp 97.5°F | Resp 16 | Ht 63.0 in | Wt 155.0 lb

## 2022-05-01 DIAGNOSIS — Z5181 Encounter for therapeutic drug level monitoring: Secondary | ICD-10-CM

## 2022-05-01 DIAGNOSIS — Z7901 Long term (current) use of anticoagulants: Secondary | ICD-10-CM | POA: Diagnosis present

## 2022-05-01 DIAGNOSIS — G894 Chronic pain syndrome: Secondary | ICD-10-CM | POA: Diagnosis not present

## 2022-05-01 DIAGNOSIS — M542 Cervicalgia: Secondary | ICD-10-CM

## 2022-05-01 DIAGNOSIS — M7918 Myalgia, other site: Secondary | ICD-10-CM

## 2022-05-01 DIAGNOSIS — R937 Abnormal findings on diagnostic imaging of other parts of musculoskeletal system: Secondary | ICD-10-CM | POA: Diagnosis present

## 2022-05-01 DIAGNOSIS — G8929 Other chronic pain: Secondary | ICD-10-CM | POA: Insufficient documentation

## 2022-05-01 DIAGNOSIS — M5481 Occipital neuralgia: Secondary | ICD-10-CM | POA: Diagnosis not present

## 2022-05-01 DIAGNOSIS — M25511 Pain in right shoulder: Secondary | ICD-10-CM | POA: Diagnosis not present

## 2022-05-01 DIAGNOSIS — M549 Dorsalgia, unspecified: Secondary | ICD-10-CM | POA: Insufficient documentation

## 2022-05-01 DIAGNOSIS — M545 Low back pain, unspecified: Secondary | ICD-10-CM | POA: Diagnosis present

## 2022-05-01 DIAGNOSIS — M25512 Pain in left shoulder: Secondary | ICD-10-CM

## 2022-05-01 DIAGNOSIS — Z79891 Long term (current) use of opiate analgesic: Secondary | ICD-10-CM

## 2022-05-01 DIAGNOSIS — M47812 Spondylosis without myelopathy or radiculopathy, cervical region: Secondary | ICD-10-CM | POA: Diagnosis present

## 2022-05-01 DIAGNOSIS — Z79899 Other long term (current) drug therapy: Secondary | ICD-10-CM

## 2022-05-01 MED ORDER — OXYCODONE HCL 5 MG PO TABS: 5.0000 mg | ORAL_TABLET | Freq: Two times a day (BID) | ORAL | 0 refills | Status: DC | PRN

## 2022-05-01 NOTE — Addendum Note (Signed)
Addended by: Delano Metz A on: 05/01/2022 03:27 PM   Modules accepted: Orders

## 2022-05-01 NOTE — Progress Notes (Signed)
Nursing Pain Medication Assessment:  Safety precautions to be maintained throughout the outpatient stay will include: orient to surroundings, keep bed in low position, maintain call bell within reach at all times, provide assistance with transfer out of bed and ambulation.  Medication Inspection Compliance: Pill count conducted under aseptic conditions, in front of the patient. Neither the pills nor the bottle was removed from the patient's sight at any time. Once count was completed pills were immediately returned to the patient in their original bottle.  Medication: Oxycodone IR Pill/Patch Count:  0 of 60 pills remain Pill/Patch Appearance: Markings consistent with prescribed medication Bottle Appearance: Standard pharmacy container. Clearly labeled. Filled Date: 3 / 43 / 2024 Last Medication intake:  Today  Patient confronted about missing at least 5 days worth of medications. She states that some days she has to take more due to pain. Patient aware that MD will not prescribe more medication. Daughter in room with patient and told her that she needs to take her medication as prescribed. Patient with understanding.

## 2022-05-06 ENCOUNTER — Encounter: Payer: Medicaid Other | Admitting: Pain Medicine

## 2022-05-07 LAB — TOXASSURE SELECT 13 (MW), URINE

## 2022-05-13 ENCOUNTER — Other Ambulatory Visit: Payer: Self-pay | Admitting: Psychiatry

## 2022-05-21 ENCOUNTER — Encounter: Payer: Self-pay | Admitting: Pain Medicine

## 2022-05-21 ENCOUNTER — Ambulatory Visit: Payer: Medicaid Other | Admitting: Pulmonary Disease

## 2022-05-21 ENCOUNTER — Ambulatory Visit: Payer: Medicaid Other | Attending: Pain Medicine | Admitting: Pain Medicine

## 2022-05-21 ENCOUNTER — Ambulatory Visit
Admission: RE | Admit: 2022-05-21 | Discharge: 2022-05-21 | Disposition: A | Discharge status: Home / Self Care | Payer: Medicaid Other | Source: Ambulatory Visit | Attending: Pain Medicine | Admitting: Pain Medicine

## 2022-05-21 ENCOUNTER — Other Ambulatory Visit: Payer: Medicaid Other

## 2022-05-21 VITALS — BP 141/87 | HR 60 | Temp 97.0°F | Resp 16 | Ht 63.0 in | Wt 154.0 lb

## 2022-05-21 DIAGNOSIS — M25511 Pain in right shoulder: Secondary | ICD-10-CM | POA: Diagnosis present

## 2022-05-21 DIAGNOSIS — M5030 Other cervical disc degeneration, unspecified cervical region: Secondary | ICD-10-CM | POA: Insufficient documentation

## 2022-05-21 DIAGNOSIS — M25512 Pain in left shoulder: Secondary | ICD-10-CM | POA: Insufficient documentation

## 2022-05-21 DIAGNOSIS — G8929 Other chronic pain: Secondary | ICD-10-CM

## 2022-05-21 DIAGNOSIS — M47812 Spondylosis without myelopathy or radiculopathy, cervical region: Secondary | ICD-10-CM | POA: Diagnosis present

## 2022-05-21 DIAGNOSIS — M542 Cervicalgia: Secondary | ICD-10-CM | POA: Insufficient documentation

## 2022-05-21 DIAGNOSIS — M5481 Occipital neuralgia: Secondary | ICD-10-CM | POA: Diagnosis present

## 2022-05-21 DIAGNOSIS — G8918 Other acute postprocedural pain: Secondary | ICD-10-CM

## 2022-05-21 DIAGNOSIS — M549 Dorsalgia, unspecified: Secondary | ICD-10-CM | POA: Diagnosis present

## 2022-05-21 MED ORDER — FENTANYL CITRATE (PF) 100 MCG/2ML IJ SOLN
25.0000 ug | INTRAMUSCULAR | Status: DC | PRN
  Administered 2022-05-21: 75 ug via INTRAVENOUS

## 2022-05-21 MED ORDER — LIDOCAINE HCL 2 % IJ SOLN
INTRAMUSCULAR | Status: AC
  Filled 2022-05-21: qty 20

## 2022-05-21 MED ORDER — FENTANYL CITRATE (PF) 100 MCG/2ML IJ SOLN
INTRAMUSCULAR | Status: AC
  Filled 2022-05-21: qty 2

## 2022-05-21 MED ORDER — ROPIVACAINE HCL 2 MG/ML IJ SOLN
9.0000 mL | Freq: Once | INTRAMUSCULAR | Status: AC
  Administered 2022-05-21: 9 mL via PERINEURAL

## 2022-05-21 MED ORDER — MIDAZOLAM HCL 5 MG/5ML IJ SOLN
INTRAMUSCULAR | Status: AC
  Filled 2022-05-21: qty 5

## 2022-05-21 MED ORDER — HYDROCODONE-ACETAMINOPHEN 5-325 MG PO TABS: 1.0000 | ORAL_TABLET | Freq: Three times a day (TID) | ORAL | 0 refills | Status: AC | PRN

## 2022-05-21 MED ORDER — DEXAMETHASONE SODIUM PHOSPHATE 10 MG/ML IJ SOLN
INTRAMUSCULAR | Status: AC
  Filled 2022-05-21: qty 1

## 2022-05-21 MED ORDER — PENTAFLUOROPROP-TETRAFLUOROETH EX AERO
INHALATION_SPRAY | Freq: Once | CUTANEOUS | Status: AC
  Administered 2022-05-21: 30 via TOPICAL
  Filled 2022-05-21: qty 116

## 2022-05-21 MED ORDER — LACTATED RINGERS IV SOLN: Freq: Once | INTRAVENOUS | Status: AC

## 2022-05-21 MED ORDER — MIDAZOLAM HCL 5 MG/5ML IJ SOLN
0.5000 mg | Freq: Once | INTRAMUSCULAR | Status: AC
  Administered 2022-05-21: 2 mg via INTRAVENOUS

## 2022-05-21 MED ORDER — DEXAMETHASONE SODIUM PHOSPHATE 10 MG/ML IJ SOLN
10.0000 mg | Freq: Once | INTRAMUSCULAR | Status: AC
  Administered 2022-05-21: 10 mg

## 2022-05-21 MED ORDER — LIDOCAINE HCL 2 % IJ SOLN
20.0000 mL | Freq: Once | INTRAMUSCULAR | Status: AC
  Administered 2022-05-21: 400 mg

## 2022-05-21 MED ORDER — ROPIVACAINE HCL 2 MG/ML IJ SOLN
INTRAMUSCULAR | Status: AC
  Filled 2022-05-21: qty 20

## 2022-05-21 NOTE — Progress Notes (Signed)
Safety precautions to be maintained throughout the outpatient stay will include: orient to surroundings, keep bed in low position, maintain call bell within reach at all times, provide assistance with transfer out of bed and ambulation.  

## 2022-05-21 NOTE — Progress Notes (Addendum)
PROVIDER NOTE: Interpretation of information contained herein should be left to medically-trained personnel. Specific patient instructions are provided elsewhere under "Patient Instructions" section of medical record. This document was created in part using STT-dictation technology, any transcriptional errors that may result from this process are unintentional.  Patient: Tanya [name] Type: Established DOB: July 15, 1960 MRN: [id number] PCP: Mick Sell, MD  Service: Procedure DOS: 05/21/2022 Setting: Ambulatory Location: Ambulatory outpatient facility Delivery: Face-to-face Provider: Oswaldo Done, MD Specialty: Interventional Pain Management Specialty designation: 09 Location: Outpatient facility Ref. Prov.: Delano Metz, MD       Interventional Therapy   Procedure: Cervical Facet, Medial Branch Radiofrequency Ablation (RFA)  #4  Laterality: Left (-LT)  Level: C3, C4, C5, C6, and C7 Medial Branch Level(s). Lesioning of these levels should completely denervate the C3-4, C4-5, C5-6, and C6-7 cervical facet joints.  Imaging: Fluoroscopy-guided         Anesthesia: Local anesthesia (1-2% Lidocaine) Anxiolysis: IV Versed 2.0 mg Sedation: Moderate Sedation Fentanyl 1.5 mL (75 mcg) DOS: 05/21/2022  Performed by: Oswaldo Done, MD  Purpose: Therapeutic/Palliative Indications: Chronic neck pain (cervicalgia) severe enough to impact quality of life or function. Rationale (medical necessity): procedure needed and proper for the diagnosis and/or treatment of Tanya [name]'s medical symptoms and needs. Indications: 1. Cervical facet syndrome (Bilateral) (R>L)   2. Spondylosis without myelopathy or radiculopathy, cervical region   3. Pain of cervical facet joint   4. Cervical spine pain   5. Cervicalgia   6. DDD (degenerative disc disease), cervical   7. Chronic neck pain (1ry area of Pain) (Bilateral) (R>L)    Tanya [name] has been dealing with the above chronic  pain for longer than three months and has either failed to respond, was unable to tolerate, or simply did not get enough benefit from other more conservative therapies including, but not limited to: 1. Over-the-counter medications 2. Anti-inflammatory medications 3. Muscle relaxants 4. Membrane stabilizers 5. Opioids 6. Physical therapy and/or chiropractic manipulation 7. Modalities (Heat, ice, etc.) 8. Invasive techniques such as nerve blocks. Tanya [name] has attained more than 50% relief of the pain from a series of diagnostic injections conducted in separate occasions.  Pain Score: Pre-procedure: 6 /10 Post-procedure: 0-No pain/10    Target: Cervical medial nerve  Location: Posterolateral aspect of the waist of the cervical articular pillar. Region: Cervical  Approach: Paramedial  Type of procedure: Radiofrequency Neurolytic Ablation   Position / Prep / Materials:  Position: Prone with head of the table was raised to facilitate breathing.  Prep solution: DuraPrep (Iodine Povacrylex [0.7% available iodine] and Isopropyl Alcohol, 74% w/w) Prep Area: Entire Posterior Cervical Region  Materials:  Tray:  RFA (Radiofrequency) tray Needle(s):  Type: RFA (Teflon-coated radiofrequency ablation needles)  Gauge (G): 22  Length: Short (5cm)  Qty: 5      Pre-op H&P Assessment:  Tanya [name] is a 62 y.o. (year old), female patient, seen today for interventional treatment. She  has a past surgical history that includes Abdominal hysterectomy; Colonoscopy; Colonoscopy with propofol (N/A, 07/03/2017); Tonsillectomy; Esophagogastroduodenoscopy (N/A, 10/21/2017); and Colonoscopy with propofol (N/A, 10/21/2017). Tanya [name] has a current medication list which includes the following prescription(s): atorvastatin, clobetasol cream, duloxetine, gabapentin, hydrocodone-acetaminophen, [START ON 05/28/2022] hydrocodone-acetaminophen, hydroxychloroquine, hydroxyzine, naloxone, nicotine, oxycodone,  [START ON 06/05/2022] oxycodone, [START ON 07/05/2022] oxycodone, and ventolin hfa, and the following Facility-Administered Medications: albuterol and fentanyl. Her primarily concern today is the Neck Pain  Initial Vital Signs:  Pulse/HCG Rate: 78ECG Heart Rate: 67 (NSR) Temp: Marland Kitchen)  97.3 F (36.3 C) Resp: 16 BP: (!) 157/78 SpO2: 98 %  BMI: Estimated body mass index is 27.28 kg/m as calculated from the following:   Height as of this encounter: 5\' 3"  (1.6 m).   Weight as of this encounter: 154 lb (69.9 kg).  Risk Assessment: Allergies: Reviewed. She is allergic to pregabalin, trazodone, bupropion, methocarbamol, and tramadol.  Allergy Precautions: None required Coagulopathies: Reviewed. None identified.  Blood-thinner therapy: None at this time Active Infection(s): Reviewed. None identified. Tanya [name] is afebrile  Site Confirmation: Tanya [name] was asked to confirm the procedure and laterality before marking the site Procedure checklist: Completed Consent: Before the procedure and under the influence of no sedative(s), amnesic(s), or anxiolytics, the patient was informed of the treatment options, risks and possible complications. To fulfill our ethical and legal obligations, as recommended by the American Medical Association's Code of Ethics, I have informed the patient of my clinical impression; the nature and purpose of the treatment or procedure; the risks, benefits, and possible complications of the intervention; the alternatives, including doing nothing; the risk(s) and benefit(s) of the alternative treatment(s) or procedure(s); and the risk(s) and benefit(s) of doing nothing. The patient was provided information about the general risks and possible complications associated with the procedure. These may include, but are not limited to: failure to achieve desired goals, infection, bleeding, organ or nerve damage, allergic reactions, paralysis, and death. In addition, the patient was  informed of those risks and complications associated to Spine-related procedures, such as failure to decrease pain; infection (i.e.: Meningitis, epidural or intraspinal abscess); bleeding (i.e.: epidural hematoma, subarachnoid hemorrhage, or any other type of intraspinal or peri-dural bleeding); organ or nerve damage (i.e.: Any type of peripheral nerve, nerve root, or spinal cord injury) with subsequent damage to sensory, motor, and/or autonomic systems, resulting in permanent pain, numbness, and/or weakness of one or several areas of the body; allergic reactions; (i.e.: anaphylactic reaction); and/or death. Furthermore, the patient was informed of those risks and complications associated with the medications. These include, but are not limited to: allergic reactions (i.e.: anaphylactic or anaphylactoid reaction(s)); adrenal axis suppression; blood sugar elevation that in diabetics may result in ketoacidosis or comma; water retention that in patients with history of congestive heart failure may result in shortness of breath, pulmonary edema, and decompensation with resultant heart failure; weight gain; swelling or edema; medication-induced neural toxicity; particulate matter embolism and blood vessel occlusion with resultant organ, and/or nervous system infarction; and/or aseptic necrosis of one or more joints. Finally, the patient was informed that Medicine is not an exact science; therefore, there is also the possibility of unforeseen or unpredictable risks and/or possible complications that may result in a catastrophic outcome. The patient indicated having understood very clearly. We have given the patient no guarantees and we have made no promises. Enough time was given to the patient to ask questions, all of which were answered to the patient's satisfaction. Ms. Dorsainvil has indicated that she wanted to continue with the procedure. Attestation: I, the ordering provider, attest that I have discussed with  the patient the benefits, risks, side-effects, alternatives, likelihood of achieving goals, and potential problems during recovery for the procedure that I have provided informed consent. Date  Time: 05/21/2022  8:04 AM   Pre-Procedure Preparation:  Monitoring: As per clinic protocol. Respiration, ETCO2, SpO2, BP, heart rate and rhythm monitor placed and checked for adequate function Safety Precautions: Patient was assessed for positional comfort and pressure points before starting the procedure. Time-out: I  initiated and conducted the "Time-out" before starting the procedure, as per protocol. The patient was asked to participate by confirming the accuracy of the "Time Out" information. Verification of the correct person, site, and procedure were performed and confirmed by me, the nursing staff, and the patient. "Time-out" conducted as per Joint Commission's Universal Protocol (UP.01.01.01). Time: 1610 Start Time: 0838 hrs.  Description/Narrative of Procedure:          Start Time: 0838 hrs. Rationale (medical necessity): procedure needed and proper for the diagnosis and/or treatment of the patient's medical symptoms and needs. Procedural Technique Safety Precautions: Aspiration looking for blood return was conducted prior to all injections. At no point did we inject any substances, as a needle was being advanced. No attempts were made at seeking any paresthesias. Safe injection practices and needle disposal techniques used. Medications properly checked for expiration dates. SDV (single dose vial) medications used. Description of the Procedure: Protocol guidelines were followed. The patient was assisted into a comfortable position. The target area was identified and the area prepped in the usual manner. Skin & deeper tissues infiltrated with local anesthetic. Appropriate amount of time allowed to pass for local anesthetics to take effect. The procedure needles were then advanced to the target area.  Proper needle placement secured. Negative aspiration confirmed. Solution injected in intermittent fashion, asking for systemic symptoms every 0.5cc of injectate. The needles were then removed and the area cleansed, making sure to leave some of the prepping solution back to take advantage of its long term bactericidal properties.  Technical description of procedure:  Laterality: See above. Radiofrequency Ablation (RFA) C3 Medial Branch Nerve RFA: The target area for the C3 dorsal medial articular branch is the lateral concave waist of the articular pillar of C3. Under fluoroscopic guidance, a Radiofrequency needle was inserted until contact was made with os over the postero-lateral aspect of the articular pillar of C3 (target area). Sensory and motor testing was conducted to properly adjust the position of the needle. Once satisfactory placement of the needle was achieved, the numbing solution was slowly injected after negative aspiration for blood. 2.0 mL of the nerve block solution was injected without difficulty or complication. After waiting for at least 3 minutes, the ablation was performed. Once completed, the needle was removed intact. C4 Medial Branch Nerve RFA: The target area for the C4 dorsal medial articular branch is the lateral concave waist of the articular pillar of C4. Under fluoroscopic guidance, a Radiofrequency needle was inserted until contact was made with os over the postero-lateral aspect of the articular pillar of C4 (target area). Sensory and motor testing was conducted to properly adjust the position of the needle. Once satisfactory placement of the needle was achieved, the numbing solution was slowly injected after negative aspiration for blood. 2.0 mL of the nerve block solution was injected without difficulty or complication. After waiting for at least 3 minutes, the ablation was performed. Once completed, the needle was removed intact. C5 Medial Branch Nerve RFA: The target area  for the C5 dorsal medial articular branch is the lateral concave waist of the articular pillar of C5. Under fluoroscopic guidance, a Radiofrequency needle was inserted until contact was made with os over the postero-lateral aspect of the articular pillar of C5 (target area). Sensory and motor testing was conducted to properly adjust the position of the needle. Once satisfactory placement of the needle was achieved, the numbing solution was slowly injected after negative aspiration for blood. 2.0 mL of the nerve block  solution was injected without difficulty or complication. After waiting for at least 3 minutes, the ablation was performed. Once completed, the needle was removed intact. C6 Medial Branch Nerve RFA: The target area for the C6 dorsal medial articular branch is the lateral concave waist of the articular pillar of C6. Under fluoroscopic guidance, a Radiofrequency needle was inserted until contact was made with os over the postero-lateral aspect of the articular pillar of C6 (target area). Sensory and motor testing was conducted to properly adjust the position of the needle. Once satisfactory placement of the needle was achieved, the numbing solution was slowly injected after negative aspiration for blood. 2.0 mL of the nerve block solution was injected without difficulty or complication. After waiting for at least 3 minutes, the ablation was performed. Once completed, the needle was removed intact. C7 Medial Branch Nerve RFA: The target for the C7 dorsal medial articular branch lies on the superior-medial tip of the C7 transverse process. Under fluoroscopic guidance, a Radiofrequency needle was inserted until contact was made with os over the postero-lateral aspect of the articular pillar of C7 (target area). Sensory and motor testing was conducted to properly adjust the position of the needle. Once satisfactory placement of the needle was achieved, the numbing solution was slowly injected after negative  aspiration for blood. 2.0 mL of the nerve block solution was injected without difficulty or complication. After waiting for at least 3 minutes, the ablation was performed. Once completed, the needle was removed intact. Radiofrequency lesioning (ablation):  Radiofrequency Generator: Medtronic AccurianTM AG 1000 RF Generator Sensory Stimulation Parameters: 50 Hz was used to locate & identify the nerve, making sure that the needle was positioned such that there was no sensory stimulation below 0.3 V or above 0.7 V. Motor Stimulation Parameters: 2 Hz was used to evaluate the motor component. Care was taken not to lesion any nerves that demonstrated motor stimulation of the lower extremities at an output of less than 2.5 times that of the sensory threshold, or a maximum of 2.0 V. Lesioning Technique Parameters: Standard Radiofrequency settings. (Not bipolar or pulsed.) Temperature Settings: 80 degrees C Lesioning time: 60 seconds Intra-operative Compliance: Compliant   Once the entire procedure was completed, the treated area was cleaned, making sure to leave some of the prepping solution back to take advantage of its long term bactericidal properties. Intra-operative Compliance: Compliant  Anatomy Reference Guide:           Vitals:   05/21/22 0910 05/21/22 0921 05/21/22 0930 05/21/22 0940  BP: 129/80 (!) 142/82 (!) 141/87   Pulse:    60  Resp: 18  16 16   Temp:   (!) 97 F (36.1 C)   SpO2: 98% 98% 96% 93%  Weight:      Height:         End Time: 0910 hrs.  Imaging Guidance (Spinal):          Type of Imaging Technique: Fluoroscopy Guidance (Spinal) Indication(s): Assistance in needle guidance and placement for procedures requiring needle placement in or near specific anatomical locations not easily accessible without such assistance. Exposure Time: Please see nurses notes. Contrast: None used. Fluoroscopic Guidance: I was personally present during the use of fluoroscopy. "Tunnel  Vision Technique" used to obtain the best possible view of the target area. Parallax error corrected before commencing the procedure. "Direction-depth-direction" technique used to introduce the needle under continuous pulsed fluoroscopy. Once target was reached, antero-posterior, oblique, and lateral fluoroscopic projection used confirm needle placement in all  planes. Images permanently stored in EMR. Interpretation: No contrast injected. I personally interpreted the imaging intraoperatively. Adequate needle placement confirmed in multiple planes. Permanent images saved into the patient's record.  Post-operative Assessment:  Post-procedure Vital Signs:  Pulse/HCG Rate: 6062 Temp: (!) 97 F (36.1 C) Resp: 16 BP: (!) 141/87 SpO2: 93 %  EBL: None  Complications: No immediate post-treatment complications observed by team, or reported by patient.  Note: The patient tolerated the entire procedure well. A repeat set of vitals were taken after the procedure and the patient was kept under observation following institutional policy, for this type of procedure. Post-procedural neurological assessment was performed, showing return to baseline, prior to discharge. The patient was provided with post-procedure discharge instructions, including a section on how to identify potential problems. Should any problems arise concerning this procedure, the patient was given instructions to immediately contact us, at any time, without hesitation. In any case, we plan to contact the patient by telephone for a follow-up status report regarding this interventional procedure.  Comments:  No additional relevant information.  Plan of Care (POC)  Orders:  Orders Placed This Encounter  Procedures   Radiofrequency,Cervical    Scheduling Instructions:     Side(s): Left-sided     Level(s): C3, C4, C5, C6, and C7 Medial Branch Nerves     Sedation: With Sedation.     Timeframe: Today    Order Specific Question:   Where will  this procedure be performed?    Answer:   [facility]   [facility] C-ARM 1-60 MIN NO REPORT    Intraoperative interpretation by procedural physician at [facility].    Standing Status:   Standing    Number of Occurrences:   1    Order Specific Question:   Reason for exam:    Answer:   Assistance in needle guidance and placement for procedures requiring needle placement in or near specific anatomical locations not easily accessible without such assistance.   Informed Consent Details: Physician/Practitioner Attestation; Transcribe to consent form and obtain patient signature    Nursing Order: Transcribe to consent form and obtain patient signature. Note: Always confirm laterality of pain with Ms. Lavoy, before procedure.    Order Specific Question:   Physician/Practitioner attestation of informed consent for procedure/surgical case    Answer:   I, the physician/practitioner, attest that I have discussed with the patient the benefits, risks, side effects, alternatives, likelihood of achieving goals and potential problems during recovery for the procedure that I have provided informed consent.    Order Specific Question:   Procedure    Answer:   Cervical Facet Radiofrequency Ablation    Order Specific Question:   Physician/Practitioner performing the procedure    Answer:   Caralee Morea A. Laban Emperor, MD    Order Specific Question:   Indication/Reason    Answer:   Neck Pain (Cervicalgia), with our without referred arm pain, due to Facet Joint Arthralgia (Joint Pain) known as Cervical Facet Syndrome, secondary to Cervical, and/or Cervico-thoracic Spondylosis (Arthritis of the Spine), without myelopathy or radiculop   Provide equipment / supplies at bedside    Procedure tray: "Radiofrequency Tray" Additional material: Large hemostat (x1); Small hemostat (x1); Towels (x8); 4x4 sterile sponge pack (x1) Needle type: Teflon-coated Radiofrequency Needle (Disposable  single  use) Size: Short Quantity: 5    Standing Status:   Standing    Number of Occurrences:   1    Order Specific Question:   Specify  Answer:   Radiofrequency Tray   Chronic Opioid Analgesic:  None MME/day: 0 mg/day.   Medications ordered for procedure: Meds ordered this encounter  Medications   lidocaine (XYLOCAINE) 2 % (with pres) injection 400 mg   pentafluoroprop-tetrafluoroeth (GEBAUERS) aerosol   lactated ringers infusion   midazolam (VERSED) 5 MG/5ML injection 0.5-2 mg    Make sure Flumazenil is available in the pyxis when using this medication. If oversedation occurs, administer 0.2 mg IV over 15 sec. If after 45 sec no response, administer 0.2 mg again over 1 min; may repeat at 1 min intervals; not to exceed 4 doses (1 mg)   fentaNYL (SUBLIMAZE) injection 25-50 mcg    Make sure Narcan is available in the pyxis when using this medication. In the event of respiratory depression (RR< 8/min): Titrate NARCAN (naloxone) in increments of 0.1 to 0.2 mg IV at 2-3 minute intervals, until desired degree of reversal.   ropivacaine (PF) 2 mg/mL (0.2%) (NAROPIN) injection 9 mL   dexamethasone (DECADRON) injection 10 mg   HYDROcodone-acetaminophen (NORCO/VICODIN) 5-325 MG tablet    Sig: Take 1 tablet by mouth every 8 (eight) hours as needed for up to 7 days for severe pain. Must last 7 days.    Dispense:  21 tablet    Refill:  0    For acute post-operative pain. Not to be refilled. Must last 7 days.   HYDROcodone-acetaminophen (NORCO/VICODIN) 5-325 MG tablet    Sig: Take 1 tablet by mouth every 8 (eight) hours as needed for up to 7 days for severe pain. Must last for 7 days.    Dispense:  21 tablet    Refill:  0    For acute post-operative pain. Not to be refilled. Must last 7 days.   Medications administered: We administered lidocaine, pentafluoroprop-tetrafluoroeth, lactated ringers, midazolam, fentaNYL, ropivacaine (PF) 2 mg/mL (0.2%), and dexamethasone.  See the medical record for  exact dosing, route, and time of administration.  Follow-up plan:   Return in about 6 weeks (around 07/02/2022) for Proc-day (T,Th), (Face2F), (PPE).       Interventional Therapies  Risk Factors  Considerations:   Antiplatelet therapy: Plaquenil (Stop: 11 days  Restart: next day)  Risk of prolonged QT Syndrome  Tobacco abuse  Anxiety  Depression \ Insomnia  SLE  Urinary incontinence     Planned  Pending:   Therapeutic left cervical facet RFA #4    Under consideration:   Therapeutic bilateral cervical facet MB RFA #4 (starting with the left side)   Completed:   Therapeutic left IA shoulder joint inj. x1 (12/30/2019) (100/100/100 x 5 days/0)  Palliative right IA shoulder joint inj. x2 (10/06/2018) (100/100/50/50)  Palliative right AC joint inj. x3 (04/02/2018) (N/A)  Palliative left medial epicondyle elbow inj. x1 (04/02/2018) (N/A)  Diagnostic left GONB x1 (12/25/2017) (N/A)  Palliative right CESI x2 (02/12/2016) (N/A) Therapeutic left C7-T1 cervical ESI x1 (03/18/2016) (100/50/0/0)  Palliative bilateral cervical facet MBB x2 (06/26/2016)  Palliative right cervical facet RFA x3 (04/13/2020) (100/100/75/75)  Palliative left cervical facet RFA x3 (03/14/2020) (100/100/75/75)  Diagnostic/therapeutic midline C7-T1 TPI/MNB x1 (10/08/2021) (100/100/100/100)    Therapeutic  Palliative (PRN) options:   Palliative cervical facet MBB  Palliative cervical facet RFA     Pharmacotherapy  Nonopioids transfer 12/22/2019: Baclofen and Neurontin        Recent Visits Date Type Provider Dept  05/01/22 Office Visit Delano Metz, MD [facility]  04/01/22 Office Visit Delano Metz, MD [facility]  Showing recent visits  within past 90 days and meeting all other requirements Today's Visits Date Type Provider Dept  05/21/22 Procedure visit Delano Metz, MD [facility]  Showing today's visits and meeting all other requirements Future  Appointments Date Type Provider Dept  07/02/22 Appointment Delano Metz, MD [facility]  07/29/22 Appointment Delano Metz, MD [facility]  Showing future appointments within next 90 days and meeting all other requirements  Disposition: Discharge home  Discharge (Date  Time): 05/21/2022; 0941 hrs.   Primary Care Physician: Mick Sell, MD Location: [facility] Note by: Oswaldo Done, MD (TTS technology used. I apologize for any typographical errors that were not detected and corrected.) Date: 05/21/2022; Time: 11:59 AM  Disclaimer:  Medicine is not an Visual merchandiser. The only guarantee in medicine is that nothing is guaranteed. It is important to note that the decision to proceed with this intervention was based on the information collected from the patient. The Data and conclusions were drawn from the patient's questionnaire, the interview, and the physical examination. Because the information was provided in large part by the patient, it cannot be guaranteed that it has not been purposely or unconsciously manipulated. Every effort has been made to obtain as much relevant data as possible for this evaluation. It is important to note that the conclusions that lead to this procedure are derived in large part from the available data. Always take into account that the treatment will also be dependent on availability of resources and existing treatment guidelines, considered by other Pain Management Practitioners as being common knowledge and practice, at the time of the intervention. For Medico-Legal purposes, it is also important to point out that variation in procedural techniques and pharmacological choices are the acceptable norm. The indications, contraindications, technique, and results of the above procedure should only be interpreted and judged by a Board-Certified Interventional Pain Specialist with extensive familiarity  and expertise in the same exact procedure and technique.

## 2022-05-21 NOTE — Patient Instructions (Signed)

## 2022-05-22 ENCOUNTER — Telehealth: Payer: Self-pay

## 2022-05-22 NOTE — Telephone Encounter (Signed)
Post procedure phone call.  Unable to leave message.   

## 2022-05-28 ENCOUNTER — Ambulatory Visit: Payer: Medicaid Other

## 2022-05-28 ENCOUNTER — Telehealth: Payer: Self-pay

## 2022-05-28 NOTE — Telephone Encounter (Signed)
My chart message sent to patient to let her know her insurance denied authorization for the MRI. I tried calling but the call would not go through.

## 2022-05-28 NOTE — Telephone Encounter (Signed)
Insurance Treatment Denial Note  Date order was entered:  Order entered by: Delano Metz, MD Requested treatment: MRI cervical spine Reason for denial:  Does not meet medical necessity  Recommended for approval:  see below   This test should be used if it is likely to result in a specific change in treatement. Such a change might be related to the need for surgery or a procedure. The notes do not show that she is going to have a surgery or procedure.

## 2022-06-19 ENCOUNTER — Other Ambulatory Visit: Payer: Self-pay | Admitting: Pulmonary Disease

## 2022-07-02 ENCOUNTER — Encounter: Payer: Self-pay | Admitting: Pain Medicine

## 2022-07-02 ENCOUNTER — Ambulatory Visit: Payer: Medicaid Other | Attending: Pain Medicine | Admitting: Pain Medicine

## 2022-07-02 VITALS — BP 156/83 | HR 82 | Temp 97.4°F | Resp 16 | Ht 63.0 in | Wt 144.0 lb

## 2022-07-02 DIAGNOSIS — M47812 Spondylosis without myelopathy or radiculopathy, cervical region: Secondary | ICD-10-CM | POA: Diagnosis present

## 2022-07-02 DIAGNOSIS — Z09 Encounter for follow-up examination after completed treatment for conditions other than malignant neoplasm: Secondary | ICD-10-CM | POA: Insufficient documentation

## 2022-07-02 DIAGNOSIS — G8929 Other chronic pain: Secondary | ICD-10-CM | POA: Diagnosis present

## 2022-07-02 DIAGNOSIS — M542 Cervicalgia: Secondary | ICD-10-CM | POA: Insufficient documentation

## 2022-07-02 NOTE — Progress Notes (Signed)
PROVIDER NOTE: Information contained herein reflects review and annotations entered in association with encounter. Interpretation of such information and data should be left to medically-trained personnel. Information provided to patient can be located elsewhere in the medical record under "Patient Instructions". Document created using STT-dictation technology, any transcriptional errors that may result from process are unintentional.    Patient: Tanya [name]  Service Category: E/M  Provider: Oswaldo Done, MD  DOB: 1960-03-25  DOS: 07/02/2022  Referring Provider: Mick Sell, MD  MRN: [id number]  Specialty: Interventional Pain Management  PCP: Mick Sell, MD  Type: Established Patient  Setting: Ambulatory outpatient    Location: Office  Delivery: Face-to-face     HPI  [name], a 62 y.o. year old female, is here today because of her Cervical facet syndrome [M47.812]. [name] primary complain today is Neck Pain (right)  Pertinent problems: [name] has Chronic upper back pain (2ry area of Pain) (Bilateral) (R>L); Chronic low back pain (3ry area of Pain) (Bilateral) (R>L) w/o sciatica; Chronic neck pain (1ry area of Pain) (Bilateral) (R>L); Fibromyalgia; Osteoarthritis; Chronic pain syndrome; Chronic upper extremity pain (Right); Chronic cervical radicular pain (Right); Chronic shoulder pain (Bilateral) (R>L); Chronic musculoskeletal pain; Cervical spondylosis; Cervical facet syndrome (Bilateral) (R>L); Shoulder radicular pain (Bilateral) (R>L); Acute postoperative pain; Spondylosis without myelopathy or radiculopathy, cervical region; Cervicalgia; Occipital headache (Left); Neurogenic pain; Cervico-occipital neuralgia (Left); Chronic elbow pain (Left); Osteoarthritis of AC (acromioclavicular) joint (Right); Chronic acromioclavicular joint pain (Right); Chronic shoulder pain (Right); Epicondylitis elbow, medial (Left); Osteoarthritis of shoulders  (Bilateral) (R>L); Chronic shoulder pain (Left); Osteoarthritis of shoulder (Left); DDD (degenerative disc disease), cervical; Arthropathy of cervical facet joint (Bilateral); Cervical spine pain; Abnormal MRI, cervical spine (09/20/2015); Cervical central spinal stenosis (Multilevel); 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 Pain Assessment: Severity of Chronic pain is reported as a 5 /10. Location: Neck Right/radiates into right shoulder and the top of right back. Onset: More than a month ago. Quality: Burning, Dull, Aching. Timing: Constant. Modifying factor(s): meds. Vitals:  height is 5\' 3"  (1.6 m) and weight is 144 lb (65.3 kg). Her temperature is 97.4 F (36.3 C) (abnormal). Her blood pressure is 156/83 (abnormal) and her pulse is 82. Her respiration is 16 and oxygen saturation is 98%.  BMI: Estimated body mass index is 25.51 kg/m as calculated from the following:   Height as of this encounter: 5\' 3"  (1.6 m).   Weight as of this encounter: 144 lb (65.3 kg). Last encounter: 05/01/2022. Last procedure: 05/21/2022.  Reason for encounter: post-procedure evaluation and assessment.  The patient indicates having attained 100% relief of the pain for the duration of the local anesthetic which then went down to an ongoing 75% improvement on the left side.  Now that that pain is better she has noticed more the pain on the right side.  Review of the medical records indicate that the last time that she had the radiofrequency ablation done on the facet joints on the right side of the cervical spine was in 2022.  By now this has definitely worn off however it did provide the patient with excellent relief for almost 2 years.  At this point the patient wants to have it repeated.  Based on the prior results I believe that it is  warranted and medically necessary.  Post-procedure evaluation  Procedure: Cervical Facet, Medial Branch Radiofrequency Ablation (RFA)  #4  Laterality: Left (-LT)  Level: C3, C4, C5, C6, and C7 Medial Branch Level(s). Lesioning of these levels should completely denervate the C3-4, C4-5, C5-6, and C6-7 cervical facet joints.  Imaging: Fluoroscopy-guided         Anesthesia: Local anesthesia (1-2% Lidocaine) Anxiolysis: IV Versed         Sedation: Moderate Sedation                       DOS: 05/21/2022  Performed by: Oswaldo Done, MD  Purpose: Therapeutic/Palliative Indications: Chronic neck pain (cervicalgia) severe enough to impact quality of life or function. Rationale (medical necessity): procedure needed and proper for the diagnosis and/or treatment of [name]'s medical symptoms and needs. Indications: 1. Cervical facet syndrome (Bilateral) (R>L)   2. Spondylosis without myelopathy or radiculopathy, cervical region   3. Pain of cervical facet joint   4. Cervical spine pain   5. Cervicalgia   6. DDD (degenerative disc disease), cervical   7. Chronic neck pain (1ry area of Pain) (Bilateral) (R>L)    [name] has been dealing with the above chronic pain for longer than three months and has either failed to respond, was unable to tolerate, or simply did not get enough benefit from other more conservative therapies including, but not limited to: 1. Over-the-counter medications 2. Anti-inflammatory medications 3. Muscle relaxants 4. Membrane stabilizers 5. Opioids 6. Physical therapy and/or chiropractic manipulation 7. Modalities (Heat, ice, etc.) 8. Invasive techniques such as nerve blocks. [name] has attained more than 50% relief of the pain from a series of diagnostic injections conducted in separate occasions.  Pain Score: Pre-procedure: 6 /10 Post-procedure: 0-No pain/10     Effectiveness:  Initial hour after procedure: 100 %. Subsequent 4-6  hours post-procedure: 100 %. Analgesia past initial 6 hours: 75 % (ongoing). Ongoing improvement:  Analgesic: The patient refers having an ongoing 75% relief of her pain on the left side.  Now that that pain is, she has noticed more of the rind on the right side.  Review of the records indicate that the last time she had the radiofrequency done on the right side was in 2022. Function: [name] reports improvement in function ROM: [name] reports improvement in ROM  Pharmacotherapy Assessment  Analgesic: None MME/day: 0 mg/day.   Monitoring: Tanya [name] PMP: PDMP reviewed during this encounter.       Pharmacotherapy: No side-effects or adverse reactions reported. Compliance: No problems identified. Effectiveness: Clinically acceptable.  Valerie Salts, RN  07/02/2022 12:26 PM  Sign when Signing Visit Safety precautions to be maintained throughout the outpatient stay will include: orient to surroundings, keep bed in low position, maintain call bell within reach at all times, provide assistance with transfer out of bed and ambulation.    No results found for: "CBDTHCR" No results found for: "D8THCCBX" No results found for: "D9THCCBX"  UDS:  Summary  Date Value Ref Range Status  05/01/2022 Note  Final    Comment:    ==================================================================== [employer] Select 13 (MW) ==================================================================== Specimen Alert Note: Urinary creatinine is low; ability to detect some drugs may be compromised. Interpret results with caution. (Creatinine) ==================================================================== Test                             Result       Flag       Units  Drug Absent but Declared for Prescription Verification   Oxycodone                      Not Detected UNEXPECTED ng/mg creat ==================================================================== Test                      Result    Flag    Units      Ref Range   Creatinine              11        LL     mg/dL      >=16 ==================================================================== Declared Medications:  The flagging and interpretation on this report are based on the  following declared medications.  Unexpected results may arise from  inaccuracies in the declared medications.   **Note: The testing scope of this panel includes these medications:   Oxycodone (Roxicodone)   **Note: The testing scope of this panel does not include the  following reported medications:   Albuterol (Ventolin HFA)  Atorvastatin (Lipitor)  Clobetasol (Temovate)  Duloxetine (Cymbalta)  Folic Acid  Gabapentin (Neurontin)  Hydroxychloroquine (Plaquenil)  Hydroxyzine (Vistaril)  Methotrexate  Naloxone (Narcan)  Nicotine ==================================================================== For clinical consultation, please call [phone]. ====================================================================       ROS  Constitutional: Denies any fever or chills Gastrointestinal: No reported hemesis, hematochezia, vomiting, or acute GI distress Musculoskeletal: Denies any acute onset joint swelling, redness, loss of ROM, or weakness Neurological: No reported episodes of acute onset apraxia, aphasia, dysarthria, agnosia, amnesia, paralysis, loss of coordination, or loss of consciousness  Medication Review  DULoxetine, albuterol, atorvastatin, baclofen, clobetasol cream, gabapentin, hydrOXYzine, hydroxychloroquine, naloxone, nicotine, and oxyCODONE  History Review  Allergy: [name] is allergic to pregabalin, trazodone, bupropion, methocarbamol, and tramadol. Drug: [name]  reports no history of drug use. Alcohol:  reports no history of alcohol use. Tobacco:  reports that she has been smoking cigarettes. She has a 35.00 pack-year smoking history. She has been exposed to tobacco smoke. She has never used smokeless  tobacco. Social: Ms. Schempp  reports that she has been smoking cigarettes. She has a 35.00 pack-year smoking history. She has been exposed to tobacco smoke. She has never used smokeless tobacco. She reports that she does not drink alcohol and does not use drugs. Medical:  has a past medical history of Anxiety, Bladder infection, Chronic lower back pain, Chronic neck pain, Collagen vascular disease (HCC), COVID-19, Depression, Diverticulitis, GERD (gastroesophageal reflux disease), Hyperlipidemia, Lupus (HCC), and Overactive bladder. Surgical: Ms. Ullery  has a past surgical history that includes Abdominal hysterectomy; Colonoscopy; Colonoscopy with propofol (N/A, 07/03/2017); Tonsillectomy; Esophagogastroduodenoscopy (N/A, 10/21/2017); and Colonoscopy with propofol (N/A, 10/21/2017). Family: family history includes Alcohol abuse in her mother; Cancer in her mother; Depression in her mother; Diabetes in her brother; Drug abuse in her son; Emphysema in her mother; Glaucoma in her father; Heart disease in her father; Hypertension in her mother; Stroke in her mother.  Laboratory Chemistry Profile   Renal Lab Results  Component Value Date   BUN 7 06/09/2019   CREATININE 0.72 06/09/2019   BCR 15 09/03/2017   GFRAA >60 06/09/2019   GFRNONAA >60 06/09/2019    Hepatic Lab Results  Component Value Date   AST 26 06/09/2019   ALT 20 06/09/2019   ALBUMIN 4.1 06/09/2019   ALKPHOS 29 (L) 06/09/2019   LIPASE 35 06/09/2019    Electrolytes Lab Results  Component Value Date   NA 135 06/09/2019  K 4.2 06/09/2019   CL 102 06/09/2019   CALCIUM 8.4 (L) 06/09/2019   MG 1.9 09/03/2017    Bone Lab Results  Component Value Date   25OHVITD1 25 (L) 09/03/2017   25OHVITD2 2.9 09/03/2017   25OHVITD3 22 09/03/2017    Inflammation (CRP: Acute Phase) (ESR: Chronic Phase) Lab Results  Component Value Date   CRP 2 09/03/2017   ESRSEDRATE 21 09/03/2017         Note: Above Lab results  reviewed.  Recent Imaging Review  DG PA[facility] C-ARM 1-60 MIN NO REPORT Fluoro was used, but no Radiologist interpretation will be provided.  Please refer to "NOTES" tab for provider progress note. Note: Reviewed        Physical Exam  General appearance: Well nourished, well developed, and well hydrated. In no apparent acute distress Mental status: Alert, oriented x 3 (person, place, & time)       Respiratory: No evidence of acute respiratory distress Eyes: PERLA Vitals: BP (!) 156/83   Pulse 82   Temp (!) 97.4 F (36.3 C)   Resp 16   Ht 5\' 3"  (1.6 m)   Wt 144 lb (65.3 kg)   SpO2 98%   BMI 25.51 kg/m  BMI: Estimated body mass index is 25.51 kg/m as calculated from the following:   Height as of this encounter: 5\' 3"  (1.6 m).   Weight as of this encounter: 144 lb (65.3 kg). Ideal: Ideal body weight: 52.4 kg (115 lb 8.3 oz) Adjusted ideal body weight: 57.6 kg (126 lb 14.6 oz)  Assessment   Diagnosis Status  1. Cervical facet syndrome (Bilateral) (R>L)   2. Pain of cervical facet joint   3. Cervical spine pain   4. Cervicalgia   5. Chronic neck pain (1ry area of Pain) (Bilateral) (R>L)   6. Postop check    Controlled Controlled Controlled   Updated Problems: No problems updated.  Plan of Care  Problem-specific:  No problem-specific Assessment & Plan notes found for this encounter.  Ms. VERNIDA [name] has a current medication list which includes the following long-term medication(s): duloxetine, gabapentin, oxycodone, [START ON 07/05/2022] oxycodone, ventolin hfa, and oxycodone.  Pharmacotherapy (Medications Ordered): No orders of the defined types were placed in this encounter.  Orders:  Orders Placed This Encounter  Procedures   Radiofrequency,Cervical    Standing Status:   Future    Standing Expiration Date:   11/01/2022    Scheduling Instructions:     Side(s): Right-sided     Level(s): C3, C4, C5, C6, and C7 Medial Branch Nerves     Sedation: With  Sedation.     Scheduling Timeframe: As soon as pre-approved    Order Specific Question:   Where will this procedure be performed?    Answer:   [facility]   Nursing Instructions:    Please complete this patient's postprocedure evaluation.    Scheduling Instructions:     Please complete this patient's postprocedure evaluation.   Follow-up plan:   Return for (ECT): (R) C-FCT RFA #4, (Blood Thinner Protocol).      Interventional Therapies  Risk Factors  Considerations:   Antiplatelet therapy: Plaquenil (Stop: 11 days  Restart: next day)  Risk of prolonged QT Syndrome  Tobacco abuse  Anxiety  Depression \ Insomnia  SLE  Urinary incontinence     Planned  Pending:   Therapeutic left cervical facet RFA #4    Under consideration:   Therapeutic bilateral cervical facet MB RFA #4 (  starting with the left side)   Completed:   Therapeutic left IA shoulder joint inj. x1 (12/30/2019) (100/100/100 x 5 days/0)  Palliative right IA shoulder joint inj. x2 (10/06/2018) (100/100/50/50)  Palliative right AC joint inj. x3 (04/02/2018) (N/A)  Palliative left medial epicondyle elbow inj. x1 (04/02/2018) (N/A)  Diagnostic left GONB x1 (12/25/2017) (N/A)  Palliative right CESI x2 (02/12/2016) (N/A) Therapeutic left C7-T1 cervical ESI x1 (03/18/2016) (100/50/0/0)  Palliative bilateral cervical facet MBB x2 (06/26/2016)  Palliative right cervical facet RFA x3 (04/13/2020) (100/100/75/75)  Palliative left cervical facet RFA x3 (03/14/2020) (100/100/75/75)  Diagnostic/therapeutic midline C7-T1 TPI/MNB x1 (10/08/2021) (100/100/100/100)    Therapeutic  Palliative (PRN) options:   Palliative cervical facet MBB  Palliative cervical facet RFA     Pharmacotherapy  Nonopioids transfer 12/22/2019: Baclofen and Neurontin         Recent Visits Date Type Provider Dept  05/21/22 Procedure visit Delano Metz, MD [facility]  05/01/22 Office Visit Delano Metz, MD  [facility]  Showing recent visits within past 90 days and meeting all other requirements Today's Visits Date Type Provider Dept  07/02/22 Office Visit Delano Metz, MD [facility]  Showing today's visits and meeting all other requirements Future Appointments Date Type Provider Dept  07/29/22 Appointment Delano Metz, MD [facility]  Showing future appointments within next 90 days and meeting all other requirements  I discussed the assessment and treatment plan with the patient. The patient was provided an opportunity to ask questions and all were answered. The patient agreed with the plan and demonstrated an understanding of the instructions.  Patient advised to call back or seek an in-person evaluation if the symptoms or condition worsens.  Duration of encounter: 30 minutes.  Total time on encounter, as per AMA guidelines included both the face-to-face and non-face-to-face time personally spent by the physician and/or other qualified health care professional(s) on the day of the encounter (includes time in activities that require the physician or other qualified health care professional and does not include time in activities normally performed by clinical staff). Physician's time may include the following activities when performed: Preparing to see the patient (e.g., pre-charting review of records, searching for previously ordered imaging, lab work, and nerve conduction tests) Review of prior analgesic pharmacotherapies. Reviewing PMP Interpreting ordered tests (e.g., lab work, imaging, nerve conduction tests) Performing post-procedure evaluations, including interpretation of diagnostic procedures Obtaining and/or reviewing separately obtained history Performing a medically appropriate examination and/or evaluation Counseling and educating the patient/family/caregiver Ordering medications, tests, or procedures Referring and communicating with  other health care professionals (when not separately reported) Documenting clinical information in the electronic or other health record Independently interpreting results (not separately reported) and communicating results to the patient/ family/caregiver Care coordination (not separately reported)  Note by: Oswaldo Done, MD Date: 07/02/2022; Time: 12:37 PM

## 2022-07-02 NOTE — Patient Instructions (Addendum)
Stop Plaquinil for 11 days prior to procedure. ______________________________________________________________________  Procedure instructions  Do not eat or drink fluids (other than water) for 6 hours before your procedure  No water for 2 hours before your procedure  Take your blood pressure medicine with a sip of water  Arrive 30 minutes before your appointment  Carefully read the "Preparing for your procedure" detailed instructions  If you have questions call us at [phone]  _____________________________________________________________________    ______________________________________________________________________  Preparing for your procedure  Appointments: If you think you may not be able to keep your appointment, call 24-48 hours in advance to cancel. We need time to make it available to others.  During your procedure appointment there will be: No Prescription Refills. No disability issues to discussed. No medication changes or discussions.  Instructions: Food intake: Avoid eating anything solid for at least 8 hours prior to your procedure. Clear liquid intake: You may take clear liquids such as water up to 2 hours prior to your procedure. (No carbonated drinks. No soda.) Transportation: Unless otherwise stated by your physician, bring a driver. Morning Medicines: Except for blood thinners, take all of your other morning medications with a sip of water. Make sure to take your heart and blood pressure medicines. If your blood pressure's lower number is above 100, the case will be rescheduled. Blood thinners: Make sure to stop your blood thinners as instructed.  If you take a blood thinner, but were not instructed to stop it, call our office [phone] and ask to talk to a nurse. Not stopping a blood thinner prior to certain procedures could lead to serious complications. Diabetics on insulin: Notify the staff so that you can be scheduled 1st case in the morning.  If your diabetes requires high dose insulin, take only  of your normal insulin dose the morning of the procedure and notify the staff that you have done so. Preventing infections: Shower with an antibacterial soap the morning of your procedure.  Build-up your immune system: Take 1000 mg of Vitamin C with every meal (3 times a day) the day prior to your procedure. Antibiotics: Inform the nursing staff if you are taking any antibiotics or if you have any conditions that may require antibiotics prior to procedures. (Example: recent joint implants)   Pregnancy: If you are pregnant make sure to notify the nursing staff. Not doing so may result in injury to the fetus, including death.  Sickness: If you have a cold, fever, or any active infections, call and cancel or reschedule your procedure. Receiving steroids while having an infection may result in complications. Arrival: You must be in the facility at least 30 minutes prior to your scheduled procedure. Tardiness: Your scheduled time is also the cutoff time. If you do not arrive at least 15 minutes prior to your procedure, you will be rescheduled.  Children: Do not bring any children with you. Make arrangements to keep them home. Dress appropriately: There is always a possibility that your clothing may get soiled. Avoid long dresses. Valuables: Do not bring any jewelry or valuables.  Reasons to call and reschedule or cancel your procedure: (Following these recommendations will minimize the risk of a serious complication.) Surgeries: Avoid having procedures within 2 weeks of any surgery. (Avoid for 2 weeks before or after any surgery). Flu Shots: Avoid having procedures within 2 weeks of a flu shots or . (Avoid for 2 weeks before or after immunizations). Barium: Avoid having a procedure within 7-10 days after having  had a radiological study involving the use of radiological contrast. (Myelograms, Barium swallow or enema study). Heart attacks: Avoid any  elective procedures or surgeries for the initial 6 months after a "Myocardial Infarction" (Heart Attack). Blood thinners: It is imperative that you stop these medications before procedures. Let us know if you if you take any blood thinner.  Infection: Avoid procedures during or within two weeks of an infection (including chest colds or gastrointestinal problems). Symptoms associated with infections include: Localized redness, fever, chills, night sweats or profuse sweating, burning sensation when voiding, cough, congestion, stuffiness, runny nose, sore throat, diarrhea, nausea, vomiting, cold or Flu symptoms, recent or current infections. It is specially important if the infection is over the area that we intend to treat. Heart and lung problems: Symptoms that may suggest an active cardiopulmonary problem include: cough, chest pain, breathing difficulties or shortness of breath, dizziness, ankle swelling, uncontrolled high or unusually low blood pressure, and/or palpitations. If you are experiencing any of these symptoms, cancel your procedure and contact your primary care physician for an evaluation.  Remember:  Regular Business hours are:  Monday to Thursday 8:00 AM to 4:00 PM  Provider's Schedule: Delano Metz, MD:  Procedure days: Tuesday and Thursday 7:30 AM to 4:00 PM  Edward Jolly, MD:  Procedure days: Monday and Wednesday 7:30 AM to 4:00 PM  ______________________________________________________________________    ____________________________________________________________________________________________  General Risks and Possible Complications  Patient Responsibilities: It is important that you read this as it is part of your informed consent. It is our duty to inform you of the risks and possible complications associated with treatments offered to you. It is your responsibility as a patient to read this and to ask questions about anything that is not clear or that you believe  was not covered in this document.  Patient's Rights: You have the right to refuse treatment. You also have the right to change your mind, even after initially having agreed to have the treatment done. However, under this last option, if you wait until the last second to change your mind, you may be charged for the materials used up to that point.  Introduction: Medicine is not an Visual merchandiser. Everything in Medicine, including the lack of treatment(s), carries the potential for danger, harm, or loss (which is by definition: Risk). In Medicine, a complication is a secondary problem, condition, or disease that can aggravate an already existing one. All treatments carry the risk of possible complications. The fact that a side effects or complications occurs, does not imply that the treatment was conducted incorrectly. It must be clearly understood that these can happen even when everything is done following the highest safety standards.  No treatment: You can choose not to proceed with the proposed treatment alternative. The "PRO(s)" would include: avoiding the risk of complications associated with the therapy. The "CON(s)" would include: not getting any of the treatment benefits. These benefits fall under one of three categories: diagnostic; therapeutic; and/or palliative. Diagnostic benefits include: getting information which can ultimately lead to improvement of the disease or symptom(s). Therapeutic benefits are those associated with the successful treatment of the disease. Finally, palliative benefits are those related to the decrease of the primary symptoms, without necessarily curing the condition (example: decreasing the pain from a flare-up of a chronic condition, such as incurable terminal cancer).  General Risks and Complications: These are associated to most interventional treatments. They can occur alone, or in combination. They fall under one of the following six (6)  categories: no benefit or  worsening of symptoms; bleeding; infection; nerve damage; allergic reactions; and/or death. No benefits or worsening of symptoms: In Medicine there are no guarantees, only probabilities. No healthcare provider can ever guarantee that a medical treatment will work, they can only state the probability that it may. Furthermore, there is always the possibility that the condition may worsen, either directly, or indirectly, as a consequence of the treatment. Bleeding: This is more common if the patient is taking a blood thinner, either prescription or over the counter (example: Goody Powders, Fish oil, Aspirin, Garlic, etc.), or if suffering a condition associated with impaired coagulation (example: Hemophilia, cirrhosis of the liver, low platelet counts, etc.). However, even if you do not have one on these, it can still happen. If you have any of these conditions, or take one of these drugs, make sure to notify your treating physician. Infection: This is more common in patients with a compromised immune system, either due to disease (example: diabetes, cancer, human immunodeficiency virus [HIV], etc.), or due to medications or treatments (example: therapies used to treat cancer and rheumatological diseases). However, even if you do not have one on these, it can still happen. If you have any of these conditions, or take one of these drugs, make sure to notify your treating physician. Nerve Damage: This is more common when the treatment is an invasive one, but it can also happen with the use of medications, such as those used in the treatment of cancer. The damage can occur to small secondary nerves, or to large primary ones, such as those in the spinal cord and brain. This damage may be temporary or permanent and it may lead to impairments that can range from temporary numbness to permanent paralysis and/or brain death. Allergic Reactions: Any time a substance or material comes in contact with our body, there is the  possibility of an allergic reaction. These can range from a mild skin rash (contact dermatitis) to a severe systemic reaction (anaphylactic reaction), which can result in death. Death: In general, any medical intervention can result in death, most of the time due to an unforeseen complication. ____________________________________________________________________________________________    ____________________________________________________________________________________________  Blood Thinners  IMPORTANT NOTICE:  If you take any of these, make sure to notify the nursing staff.  Failure to do so may result in injury.  Recommended time intervals to stop and restart blood-thinners, before & after invasive procedures  Generic Name Brand Name Pre-procedure. Stop this long before procedure. Post-procedure. Minimum waiting period before restarting.  Abciximab Reopro 15 days 2 hrs  Alteplase Activase 10 days 10 days  Anagrelide Agrylin    Apixaban Eliquis 3 days 6 hrs  Cilostazol Pletal 3 days 5 hrs  Clopidogrel Plavix 7-10 days 2 hrs  Dabigatran Pradaxa 5 days 6 hrs  Dalteparin Fragmin 24 hours 4 hrs  Dipyridamole Aggrenox 11days 2 hrs  Edoxaban Lixiana; Savaysa 3 days 2 hrs  Enoxaparin  Lovenox 24 hours 4 hrs  Eptifibatide Integrillin 8 hours 2 hrs  Fondaparinux  Arixtra 72 hours 12 hrs  Hydroxychloroquine Plaquenil 11 days   Prasugrel Effient 7-10 days 6 hrs  Reteplase Retavase 10 days 10 days  Rivaroxaban Xarelto 3 days 6 hrs  Ticagrelor Brilinta 5-7 days 6 hrs  Ticlopidine Ticlid 10-14 days 2 hrs  Tinzaparin Innohep 24 hours 4 hrs  Tirofiban Aggrastat 8 hours 2 hrs  Warfarin Coumadin 5 days 2 hrs   Other medications with blood-thinning effects  Product indications Generic (Brand) names Note  Cholesterol Lipitor Stop 4 days before procedure  Blood thinner (injectable) Heparin (LMW or LMWH Heparin) Stop 24 hours before procedure  Cancer Ibrutinib (Imbruvica) Stop 7 days before  procedure  Malaria/Rheumatoid Hydroxychloroquine (Plaquenil) Stop 11 days before procedure  Thrombolytics  10 days before or after procedures   Over-the-counter (OTC) Products with blood-thinning effects  Product Common names Stop Time  Aspirin > 325 mg Goody Powders, Excedrin, etc. 11 days  Aspirin ? 81 mg  7 days  Fish oil  4 days  Garlic supplements  7 days  Ginkgo biloba  36 hours  Ginseng  24 hours  NSAIDs Ibuprofen, Naprosyn, etc. 3 days  Vitamin E  4 days   ____________________________________________________________________________________________

## 2022-07-02 NOTE — Progress Notes (Signed)
Safety precautions to be maintained throughout the outpatient stay will include: orient to surroundings, keep bed in low position, maintain call bell within reach at all times, provide assistance with transfer out of bed and ambulation.  

## 2022-07-15 ENCOUNTER — Other Ambulatory Visit: Payer: Self-pay | Admitting: Psychiatry

## 2022-07-15 DIAGNOSIS — F411 Generalized anxiety disorder: Secondary | ICD-10-CM

## 2022-07-15 NOTE — Progress Notes (Signed)
PROVIDER NOTE: Interpretation of information contained herein should be left to medically-trained personnel. Specific patient instructions are provided elsewhere under "Patient Instructions" section of medical record. This document was created in part using STT-dictation technology, any transcriptional errors that may result from this process are unintentional.  Patient: Tanya [name] Type: Established DOB: 02-Jan-1961 MRN: [id number] PCP: Mick Sell, MD  Service: Procedure DOS: 07/18/2022 Setting: Ambulatory Location: Ambulatory outpatient facility Delivery: Face-to-face Provider: Oswaldo Done, MD Specialty: Interventional Pain Management Specialty designation: 09 Location: Outpatient facility Ref. Prov.: Delano Metz, MD       Interventional Therapy   Procedure: Cervical Facet, Medial Branch Radiofrequency Ablation (RFA)  #4  Laterality: Right (-RT)  Level: TON, C3, C4, C5, C6, C7, C8, and T1 Medial Branch Level(s). Lesioning of these levels should completely denervate the C2-3, C3-4, C4-5, C5-6, C6-7, and C7-T1 cervical facet joints.  Imaging: Fluoroscopy-guided         Anesthesia: Local anesthesia (1-2% Lidocaine) Anxiolysis:    ***    Versed         Sedation:                         DOS: 07/18/2022  Performed by: Oswaldo Done, MD  Purpose: Therapeutic/Palliative Indications: Chronic neck pain (cervicalgia) severe enough to impact quality of life or function. Rationale (medical necessity): procedure needed and proper for the diagnosis and/or treatment of Tanya [name]'s medical symptoms and needs. Indications: No diagnosis found. Tanya [name] has been dealing with the above chronic pain for longer than three months and has either failed to respond, was unable to tolerate, or simply did not get enough benefit from other more conservative therapies including, but not limited to: 1. Over-the-counter medications 2. Anti-inflammatory medications 3.  Muscle relaxants 4. Membrane stabilizers 5. Opioids 6. Physical therapy and/or chiropractic manipulation 7. Modalities (Heat, ice, etc.) 8. Invasive techniques such as nerve blocks. Tanya [name] has attained more than 50% relief of the pain from a series of diagnostic injections conducted in separate occasions.  Pain Score: Pre-procedure:  /10 Post-procedure:  /10    Target: Cervical medial nerve  Location: Posterolateral aspect of the waist of the cervical articular pillar. Region: Cervical  Approach: Paramedial  Type of procedure: Radiofrequency Neurolytic Ablation   Position / Prep / Materials:  Position: Prone with head of the table was raised to facilitate breathing.  Prep solution: DuraPrep (Iodine Povacrylex [0.7% available iodine] and Isopropyl Alcohol, 74% w/w) Prep Area: Entire Posterior Cervical Region  Materials:  Tray:  RFA (Radiofrequency) tray Needle(s):  Type: RFA (Teflon-coated radiofrequency ablation needles)  Gauge (G):    ***     Length:    ***     Qty:    ***      Pre-op H&P Assessment:  Tanya [name] is a 62 y.o. (year old), female patient, seen today for interventional treatment. She  has a past surgical history that includes Abdominal hysterectomy; Colonoscopy; Colonoscopy with propofol (N/A, 07/03/2017); Tonsillectomy; Esophagogastroduodenoscopy (N/A, 10/21/2017); and Colonoscopy with propofol (N/A, 10/21/2017). Tanya [name] has a current medication list which includes the following prescription(s): atorvastatin, baclofen, clobetasol cream, duloxetine, gabapentin, hydroxychloroquine, hydroxyzine, naloxone, nicotine, oxycodone, oxycodone, oxycodone, and ventolin hfa, and the following Facility-Administered Medications: albuterol. Her primarily concern today is the No chief complaint on file.  Initial Vital Signs:  Pulse/HCG Rate:    Temp:   Resp:   BP:   SpO2:    BMI: Estimated body mass index is  25.51 kg/m as calculated from the following:    Height as of 07/02/22: 5\' 3"  (1.6 m).   Weight as of 07/02/22: 144 lb (65.3 kg).  Risk Assessment: Allergies: Reviewed. She is allergic to pregabalin, trazodone, bupropion, methocarbamol, and tramadol.  Allergy Precautions: None required Coagulopathies: Reviewed. None identified.  Blood-thinner therapy: None at this time Active Infection(s): Reviewed. None identified. Tanya [name] is afebrile  Site Confirmation: Tanya [name] was asked to confirm the procedure and laterality before marking the site Procedure checklist: Completed Consent: Before the procedure and under the influence of no sedative(s), amnesic(s), or anxiolytics, the patient was informed of the treatment options, risks and possible complications. To fulfill our ethical and legal obligations, as recommended by the American Medical Association's Code of Ethics, I have informed the patient of my clinical impression; the nature and purpose of the treatment or procedure; the risks, benefits, and possible complications of the intervention; the alternatives, including doing nothing; the risk(s) and benefit(s) of the alternative treatment(s) or procedure(s); and the risk(s) and benefit(s) of doing nothing. The patient was provided information about the general risks and possible complications associated with the procedure. These may include, but are not limited to: failure to achieve desired goals, infection, bleeding, organ or nerve damage, allergic reactions, paralysis, and death. In addition, the patient was informed of those risks and complications associated to Spine-related procedures, such as failure to decrease pain; infection (i.e.: Meningitis, epidural or intraspinal abscess); bleeding (i.e.: epidural hematoma, subarachnoid hemorrhage, or any other type of intraspinal or peri-dural bleeding); organ or nerve damage (i.e.: Any type of peripheral nerve, nerve root, or spinal cord injury) with subsequent damage to sensory, motor, and/or  autonomic systems, resulting in permanent pain, numbness, and/or weakness of one or several areas of the body; allergic reactions; (i.e.: anaphylactic reaction); and/or death. Furthermore, the patient was informed of those risks and complications associated with the medications. These include, but are not limited to: allergic reactions (i.e.: anaphylactic or anaphylactoid reaction(s)); adrenal axis suppression; blood sugar elevation that in diabetics may result in ketoacidosis or comma; water retention that in patients with history of congestive heart failure may result in shortness of breath, pulmonary edema, and decompensation with resultant heart failure; weight gain; swelling or edema; medication-induced neural toxicity; particulate matter embolism and blood vessel occlusion with resultant organ, and/or nervous system infarction; and/or aseptic necrosis of one or more joints. Finally, the patient was informed that Medicine is not an exact science; therefore, there is also the possibility of unforeseen or unpredictable risks and/or possible complications that may result in a catastrophic outcome. The patient indicated having understood very clearly. We have given the patient no guarantees and we have made no promises. Enough time was given to the patient to ask questions, all of which were answered to the patient's satisfaction. Ms. Justo has indicated that she wanted to continue with the procedure. Attestation: I, the ordering provider, attest that I have discussed with the patient the benefits, risks, side-effects, alternatives, likelihood of achieving goals, and potential problems during recovery for the procedure that I have provided informed consent. Date  Time: {CHL [facility]-PAIN TIME CHOICES:[id number]}   Pre-Procedure Preparation:  Monitoring: As per clinic protocol. Respiration, ETCO2, SpO2, BP, heart rate and rhythm monitor placed and checked for adequate function Safety Precautions: Patient  was assessed for positional comfort and pressure points before starting the procedure. Time-out: I initiated and conducted the "Time-out" before starting the procedure, as per protocol. The patient was asked to participate by confirming  the accuracy of the "Time Out" information. Verification of the correct person, site, and procedure were performed and confirmed by me, the nursing staff, and the patient. "Time-out" conducted as per Joint Commission's Universal Protocol (UP.01.01.01). Time:   Start Time:   hrs.  Description/Narrative of Procedure:          Start Time:   hrs. Rationale (medical necessity): procedure needed and proper for the diagnosis and/or treatment of the patient's medical symptoms and needs. Procedural Technique Safety Precautions: Aspiration looking for blood return was conducted prior to all injections. At no point did we inject any substances, as a needle was being advanced. No attempts were made at seeking any paresthesias. Safe injection practices and needle disposal techniques used. Medications properly checked for expiration dates. SDV (single dose vial) medications used. Description of the Procedure: Protocol guidelines were followed. The patient was assisted into a comfortable position. The target area was identified and the area prepped in the usual manner. Skin & deeper tissues infiltrated with local anesthetic. Appropriate amount of time allowed to pass for local anesthetics to take effect. The procedure needles were then advanced to the target area. Proper needle placement secured. Negative aspiration confirmed. Solution injected in intermittent fashion, asking for systemic symptoms every 0.5cc of injectate. The needles were then removed and the area cleansed, making sure to leave some of the prepping solution back to take advantage of its long term bactericidal properties.  Technical description of procedure:  Laterality: See above. Radiofrequency Ablation (RFA) C3  Medial Branch Nerve RFA: The target area for the C3 dorsal medial articular branch is the lateral concave waist of the articular pillar of C3. Under fluoroscopic guidance, a Radiofrequency needle was inserted until contact was made with os over the postero-lateral aspect of the articular pillar of C3 (target area). Sensory and motor testing was conducted to properly adjust the position of the needle. Once satisfactory placement of the needle was achieved, the numbing solution was slowly injected after negative aspiration for blood. 2.0 mL of the nerve block solution was injected without difficulty or complication. After waiting for at least 3 minutes, the ablation was performed. Once completed, the needle was removed intact. C4 Medial Branch Nerve RFA: The target area for the C4 dorsal medial articular branch is the lateral concave waist of the articular pillar of C4. Under fluoroscopic guidance, a Radiofrequency needle was inserted until contact was made with os over the postero-lateral aspect of the articular pillar of C4 (target area). Sensory and motor testing was conducted to properly adjust the position of the needle. Once satisfactory placement of the needle was achieved, the numbing solution was slowly injected after negative aspiration for blood. 2.0 mL of the nerve block solution was injected without difficulty or complication. After waiting for at least 3 minutes, the ablation was performed. Once completed, the needle was removed intact. C5 Medial Branch Nerve RFA: The target area for the C5 dorsal medial articular branch is the lateral concave waist of the articular pillar of C5. Under fluoroscopic guidance, a Radiofrequency needle was inserted until contact was made with os over the postero-lateral aspect of the articular pillar of C5 (target area). Sensory and motor testing was conducted to properly adjust the position of the needle. Once satisfactory placement of the needle was achieved, the numbing  solution was slowly injected after negative aspiration for blood. 2.0 mL of the nerve block solution was injected without difficulty or complication. After waiting for at least 3 minutes, the ablation was  performed. Once completed, the needle was removed intact. C6 Medial Branch Nerve RFA: The target area for the C6 dorsal medial articular branch is the lateral concave waist of the articular pillar of C6. Under fluoroscopic guidance, a Radiofrequency needle was inserted until contact was made with os over the postero-lateral aspect of the articular pillar of C6 (target area). Sensory and motor testing was conducted to properly adjust the position of the needle. Once satisfactory placement of the needle was achieved, the numbing solution was slowly injected after negative aspiration for blood. 2.0 mL of the nerve block solution was injected without difficulty or complication. After waiting for at least 3 minutes, the ablation was performed. Once completed, the needle was removed intact. C7 Medial Branch Nerve RFA: The target for the C7 dorsal medial articular branch lies on the superior-medial tip of the C7 transverse process. Under fluoroscopic guidance, a Radiofrequency needle was inserted until contact was made with os over the postero-lateral aspect of the articular pillar of C7 (target area). Sensory and motor testing was conducted to properly adjust the position of the needle. Once satisfactory placement of the needle was achieved, the numbing solution was slowly injected after negative aspiration for blood. 2.0 mL of the nerve block solution was injected without difficulty or complication. After waiting for at least 3 minutes, the ablation was performed. Once completed, the needle was removed intact. Radiofrequency lesioning (ablation):  Radiofrequency Generator: Medtronic AccurianTM AG 1000 RF Generator Sensory Stimulation Parameters: 50 Hz was used to locate & identify the nerve, making sure that the  needle was positioned such that there was no sensory stimulation below 0.3 V or above 0.7 V. Motor Stimulation Parameters: 2 Hz was used to evaluate the motor component. Care was taken not to lesion any nerves that demonstrated motor stimulation of the lower extremities at an output of less than 2.5 times that of the sensory threshold, or a maximum of 2.0 V. Lesioning Technique Parameters: Standard Radiofrequency settings. (Not bipolar or pulsed.) Temperature Settings: 80 degrees C Lesioning time: 60 seconds Intra-operative Compliance: Compliant   Once the entire procedure was completed, the treated area was cleaned, making sure to leave some of the prepping solution back to take advantage of its long term bactericidal properties. Intra-operative Compliance: Compliant  Anatomy Reference Guide:         Facet Joint Innervation  C1-2 Third occipital Nerve (TON)  C2-3 TON, C3  Medial Branch  C3-4 C3, C4         "          "  C4-5 C4, C5         "          "  C5-6 C5, C6         "          "  C6-7 C6, C7         "          "  C7-T1 C7, C8         "          "   Cervical Facet Pain Pattern overlap:   There were no vitals filed for this visit.   End Time:   hrs.  Imaging Guidance (Spinal):          Type of Imaging Technique: Fluoroscopy Guidance (Spinal) Indication(s): Assistance in needle guidance and placement for procedures requiring needle placement in or near specific anatomical locations not easily accessible without such assistance.  Exposure Time: Please see nurses notes. Contrast: None used. Fluoroscopic Guidance: I was personally present during the use of fluoroscopy. "Tunnel Vision Technique" used to obtain the best possible view of the target area. Parallax error corrected before commencing the procedure. "Direction-depth-direction" technique used to introduce the needle under continuous pulsed fluoroscopy. Once target was reached, antero-posterior, oblique, and lateral  fluoroscopic projection used confirm needle placement in all planes. Images permanently stored in EMR. Interpretation: No contrast injected. I personally interpreted the imaging intraoperatively. Adequate needle placement confirmed in multiple planes. Permanent images saved into the patient's record.  Post-operative Assessment:  Post-procedure Vital Signs:  Pulse/HCG Rate:    Temp:   Resp:   BP:   SpO2:    EBL: None  Complications: No immediate post-treatment complications observed by team, or reported by patient.  Note: The patient tolerated the entire procedure well. A repeat set of vitals were taken after the procedure and the patient was kept under observation following institutional policy, for this type of procedure. Post-procedural neurological assessment was performed, showing return to baseline, prior to discharge. The patient was provided with post-procedure discharge instructions, including a section on how to identify potential problems. Should any problems arise concerning this procedure, the patient was given instructions to immediately contact us, at any time, without hesitation. In any case, we plan to contact the patient by telephone for a follow-up status report regarding this interventional procedure.  Comments:  No additional relevant information.  Plan of Care (POC)  Orders:  No orders of the defined types were placed in this encounter.  Chronic Opioid Analgesic:  None MME/day: 0 mg/day.   Medications ordered for procedure: No orders of the defined types were placed in this encounter.  Medications administered: Calynn L. Pape had no medications administered during this visit.  See the medical record for exact dosing, route, and time of administration.  Follow-up plan:   No follow-ups on file.       Interventional Therapies  Risk Factors  Considerations:   Antiplatelet therapy: Plaquenil (Stop: 11 days  Restart: next day)  Risk of prolonged QT  Syndrome  Tobacco abuse  Anxiety  Depression \ Insomnia  SLE  Urinary incontinence     Planned  Pending:   Therapeutic left cervical facet RFA #4    Under consideration:   Therapeutic bilateral cervical facet MB RFA #4 (starting with the left side)   Completed:   Therapeutic left IA shoulder joint inj. x1 (12/30/2019) (100/100/100 x 5 days/0)  Palliative right IA shoulder joint inj. x2 (10/06/2018) (100/100/50/50)  Palliative right AC joint inj. x3 (04/02/2018) (N/A)  Palliative left medial epicondyle elbow inj. x1 (04/02/2018) (N/A)  Diagnostic left GONB x1 (12/25/2017) (N/A)  Palliative right CESI x2 (02/12/2016) (N/A) Therapeutic left C7-T1 cervical ESI x1 (03/18/2016) (100/50/0/0)  Palliative bilateral cervical facet MBB x2 (06/26/2016)  Palliative right cervical facet RFA x3 (04/13/2020) (100/100/75/75)  Palliative left cervical facet RFA x3 (03/14/2020) (100/100/75/75)  Diagnostic/therapeutic midline C7-T1 TPI/MNB x1 (10/08/2021) (100/100/100/100)    Therapeutic  Palliative (PRN) options:   Palliative cervical facet MBB  Palliative cervical facet RFA     Pharmacotherapy  Nonopioids transfer 12/22/2019: Baclofen and Neurontin          Recent Visits Date Type Provider Dept  07/02/22 Office Visit Delano Metz, MD [facility]  05/21/22 Procedure visit Delano Metz, MD [facility]  05/01/22 Office Visit Delano Metz, MD [facility]  Showing recent visits within past 90 days and meeting all  other requirements Future Appointments Date Type Provider Dept  07/18/22 Appointment Delano Metz, MD [facility]  07/29/22 Appointment Delano Metz, MD [facility]  Showing future appointments within next 90 days and meeting all other requirements  Disposition: Discharge home  Discharge (Date  Time): 07/18/2022;   hrs.   Primary Care Physician: Mick Sell, MD Location: [facility] Note by: Oswaldo Done, MD (TTS technology used. I apologize for any typographical errors that were not detected and corrected.) Date: 07/18/2022; Time: 6:25 AM  Disclaimer:  Medicine is not an Visual merchandiser. The only guarantee in medicine is that nothing is guaranteed. It is important to note that the decision to proceed with this intervention was based on the information collected from the patient. The Data and conclusions were drawn from the patient's questionnaire, the interview, and the physical examination. Because the information was provided in large part by the patient, it cannot be guaranteed that it has not been purposely or unconsciously manipulated. Every effort has been made to obtain as much relevant data as possible for this evaluation. It is important to note that the conclusions that lead to this procedure are derived in large part from the available data. Always take into account that the treatment will also be dependent on availability of resources and existing treatment guidelines, considered by other Pain Management Practitioners as being common knowledge and practice, at the time of the intervention. For Medico-Legal purposes, it is also important to point out that variation in procedural techniques and pharmacological choices are the acceptable norm. The indications, contraindications, technique, and results of the above procedure should only be interpreted and judged by a Board-Certified Interventional Pain Specialist with extensive familiarity and expertise in the same exact procedure and technique.

## 2022-07-18 ENCOUNTER — Ambulatory Visit
Admission: RE | Admit: 2022-07-18 | Discharge: 2022-07-18 | Disposition: A | Discharge status: Home / Self Care | Payer: Medicaid Other | Source: Ambulatory Visit | Attending: Pain Medicine | Admitting: Pain Medicine

## 2022-07-18 ENCOUNTER — Encounter: Payer: Self-pay | Admitting: Pain Medicine

## 2022-07-18 ENCOUNTER — Ambulatory Visit: Payer: Medicaid Other | Attending: Pain Medicine | Admitting: Pain Medicine

## 2022-07-18 VITALS — BP 144/61 | HR 83 | Temp 97.0°F | Resp 16 | Ht 63.0 in | Wt 155.0 lb

## 2022-07-18 DIAGNOSIS — Z7901 Long term (current) use of anticoagulants: Secondary | ICD-10-CM | POA: Diagnosis present

## 2022-07-18 DIAGNOSIS — M47812 Spondylosis without myelopathy or radiculopathy, cervical region: Secondary | ICD-10-CM | POA: Insufficient documentation

## 2022-07-18 DIAGNOSIS — G8929 Other chronic pain: Secondary | ICD-10-CM | POA: Insufficient documentation

## 2022-07-18 DIAGNOSIS — Z5189 Encounter for other specified aftercare: Secondary | ICD-10-CM | POA: Diagnosis present

## 2022-07-18 DIAGNOSIS — M542 Cervicalgia: Secondary | ICD-10-CM | POA: Diagnosis present

## 2022-07-18 DIAGNOSIS — G8918 Other acute postprocedural pain: Secondary | ICD-10-CM | POA: Diagnosis present

## 2022-07-18 MED ORDER — HYDROCODONE-ACETAMINOPHEN 5-325 MG PO TABS
1.0000 | ORAL_TABLET | Freq: Three times a day (TID) | ORAL | 0 refills | Status: AC | PRN
Start: 2022-07-18 — End: 2022-07-25

## 2022-07-18 MED ORDER — FENTANYL CITRATE (PF) 100 MCG/2ML IJ SOLN
INTRAMUSCULAR | Status: AC
  Filled 2022-07-18: qty 2

## 2022-07-18 MED ORDER — FENTANYL CITRATE (PF) 100 MCG/2ML IJ SOLN
25.0000 ug | INTRAMUSCULAR | Status: DC | PRN
  Administered 2022-07-18: 50 ug via INTRAVENOUS

## 2022-07-18 MED ORDER — LACTATED RINGERS IV SOLN: Freq: Once | INTRAVENOUS | Status: AC

## 2022-07-18 MED ORDER — DEXAMETHASONE SODIUM PHOSPHATE 10 MG/ML IJ SOLN
INTRAMUSCULAR | Status: AC
  Filled 2022-07-18: qty 1

## 2022-07-18 MED ORDER — MIDAZOLAM HCL 5 MG/5ML IJ SOLN
0.5000 mg | Freq: Once | INTRAMUSCULAR | Status: AC
  Administered 2022-07-18: 2 mg via INTRAVENOUS

## 2022-07-18 MED ORDER — PENTAFLUOROPROP-TETRAFLUOROETH EX AERO
INHALATION_SPRAY | Freq: Once | CUTANEOUS | Status: AC
  Administered 2022-07-18: 30 via TOPICAL
  Filled 2022-07-18: qty 116

## 2022-07-18 MED ORDER — ROPIVACAINE HCL 2 MG/ML IJ SOLN
9.0000 mL | Freq: Once | INTRAMUSCULAR | Status: AC
  Administered 2022-07-18: 9 mL via PERINEURAL

## 2022-07-18 MED ORDER — DEXAMETHASONE SODIUM PHOSPHATE 10 MG/ML IJ SOLN
10.0000 mg | Freq: Once | INTRAMUSCULAR | Status: AC
  Administered 2022-07-18: 10 mg

## 2022-07-18 MED ORDER — LIDOCAINE HCL 2 % IJ SOLN
20.0000 mL | Freq: Once | INTRAMUSCULAR | Status: AC
  Administered 2022-07-18: 400 mg

## 2022-07-18 MED ORDER — ROPIVACAINE HCL 2 MG/ML IJ SOLN
INTRAMUSCULAR | Status: AC
  Filled 2022-07-18: qty 20

## 2022-07-18 MED ORDER — LIDOCAINE HCL 2 % IJ SOLN
INTRAMUSCULAR | Status: AC
  Filled 2022-07-18: qty 20

## 2022-07-18 MED ORDER — MIDAZOLAM HCL 5 MG/5ML IJ SOLN
INTRAMUSCULAR | Status: AC
  Filled 2022-07-18: qty 5

## 2022-07-18 MED ORDER — HYDROCODONE-ACETAMINOPHEN 5-325 MG PO TABS
1.0000 | ORAL_TABLET | Freq: Three times a day (TID) | ORAL | 0 refills | Status: DC | PRN
Start: 2022-07-25 — End: 2022-07-29

## 2022-07-18 NOTE — Patient Instructions (Signed)

## 2022-07-19 ENCOUNTER — Telehealth: Payer: Self-pay | Admitting: Pain Medicine

## 2022-07-19 ENCOUNTER — Telehealth: Payer: Self-pay | Admitting: *Deleted

## 2022-07-19 NOTE — Telephone Encounter (Signed)
Noted  

## 2022-07-19 NOTE — Telephone Encounter (Signed)
PT called stated that she is sorry she missed the call. PT stated that she was in a lot of pain on yesterday, today is doing fine.

## 2022-07-19 NOTE — Telephone Encounter (Signed)
Post procedure call;  voicemail left.   

## 2022-07-28 NOTE — Progress Notes (Unsigned)
Patient called and canceled today's medication management appointment indicating that she was sick with nausea and vomiting and was going to the urgent care.

## 2022-07-28 NOTE — Patient Instructions (Signed)
____________________________________________________________________________________________  Opioid Pain Medication Update  To: All patients taking opioid pain medications. (I.e.: hydrocodone, hydromorphone, oxycodone, oxymorphone, morphine, codeine, methadone, tapentadol, tramadol, buprenorphine, fentanyl, etc.)  Re: Updated review of side effects and adverse reactions of opioid analgesics, as well as new information about long term effects of this class of medications.  Direct risks of long-term opioid therapy are not limited to opioid addiction and overdose. Potential medical risks include serious fractures, breathing problems during sleep, hyperalgesia, immunosuppression, chronic constipation, bowel obstruction, myocardial infarction, and tooth decay secondary to xerostomia.  Unpredictable adverse effects that can occur even if you take your medication correctly: Cognitive impairment, respiratory depression, and death. Most people think that if they take their medication "correctly", and "as instructed", that they will be safe. Nothing could be farther from the truth. In reality, a significant amount of recorded deaths associated with the use of opioids has occurred in individuals that had taken the medication for a long time, and were taking their medication correctly. The following are examples of how this can happen: Patient taking his/her medication for a long time, as instructed, without any side effects, is given a certain antibiotic or another unrelated medication, which in turn triggers a "Drug-to-drug interaction" leading to disorientation, cognitive impairment, impaired reflexes, respiratory depression or an untoward event leading to serious bodily harm or injury, including death.  Patient taking his/her medication for a long time, as instructed, without any side effects, develops an acute impairment of liver and/or kidney function. This will lead to a rapid inability of the body to  breakdown and eliminate their pain medication, which will result in effects similar to an "overdose", but with the same medicine and dose that they had always taken. This again may lead to disorientation, cognitive impairment, impaired reflexes, respiratory depression or an untoward event leading to serious bodily harm or injury, including death.  A similar problem will occur with patients as they grow older and their liver and kidney function begins to decrease as part of the aging process.  Background information: Historically, the original case for using long-term opioid therapy to treat chronic noncancer pain was based on safety assumptions that subsequent experience has called into question. In 1996, the American Pain Society and the American Academy of Pain Medicine issued a consensus statement supporting long-term opioid therapy. This statement acknowledged the dangers of opioid prescribing but concluded that the risk for addiction was low; respiratory depression induced by opioids was short-lived, occurred mainly in opioid-naive patients, and was antagonized by pain; tolerance was not a common problem; and efforts to control diversion should not constrain opioid prescribing. This has now proven to be wrong. Experience regarding the risks for opioid addiction, misuse, and overdose in community practice has failed to support these assumptions.  According to the Centers for Disease Control and Prevention, fatal overdoses involving opioid analgesics have increased sharply over the past decade. Currently, more than 96,700 people die from drug overdoses every year. Opioids are a factor in 7 out of every 10 overdose deaths. Deaths from drug overdose have surpassed motor vehicle accidents as the leading cause of death for individuals between the ages of 35 and 54.  Clinical data suggest that neuroendocrine dysfunction may be very common in both men and women, potentially causing hypogonadism, erectile  dysfunction, infertility, decreased libido, osteoporosis, and depression. Recent studies linked higher opioid dose to increased opioid-related mortality. Controlled observational studies reported that long-term opioid therapy may be associated with increased risk for cardiovascular events. Subsequent meta-analysis concluded   that the safety of long-term opioid therapy in elderly patients has not been proven.   Side Effects and adverse reactions: Common side effects: Drowsiness (sedation). Dizziness. Nausea and vomiting. Constipation. Physical dependence -- Dependence often manifests with withdrawal symptoms when opioids are discontinued or decreased. Tolerance -- As you take repeated doses of opioids, you require increased medication to experience the same effect of pain relief. Respiratory depression -- This can occur in healthy people, especially with higher doses. However, people with COPD, asthma or other lung conditions may be even more susceptible to fatal respiratory impairment.  Uncommon side effects: An increased sensitivity to feeling pain and extreme response to pain (hyperalgesia). Chronic use of opioids can lead to this. Delayed gastric emptying (the process by which the contents of your stomach are moved into your small intestine). Muscle rigidity. Immune system and hormonal dysfunction. Quick, involuntary muscle jerks (myoclonus). Arrhythmia. Itchy skin (pruritus). Dry mouth (xerostomia).  Long-term side effects: Chronic constipation. Sleep-disordered breathing (SDB). Increased risk of bone fractures. Hypothalamic-pituitary-adrenal dysregulation. Increased risk of overdose.  RISKS: Fractures and Falls:  Opioids increase the risk and incidence of falls. This is of particular importance in elderly patients.  Endocrine System:  Long-term administration is associated with endocrine abnormalities (endocrinopathies). (Also known as Opioid-induced Endocrinopathy) Influences  on both the hypothalamic-pituitary-adrenal axis?and the hypothalamic-pituitary-gonadal axis have been demonstrated with consequent hypogonadism and adrenal insufficiency in both sexes. Hypogonadism and decreased levels of dehydroepiandrosterone sulfate have been reported in men and women. Endocrine effects include: Amenorrhoea in women (abnormal absence of menstruation) Reduced libido in both sexes Decreased sexual function Erectile dysfunction in men Hypogonadisms (decreased testicular function with shrinkage of testicles) Infertility Depression and fatigue Loss of muscle mass Anxiety Depression Immune suppression Hyperalgesia Weight gain Anemia Osteoporosis Patients (particularly women of childbearing age) should avoid opioids. There is insufficient evidence to recommend routine monitoring of asymptomatic patients taking opioids in the long-term for hormonal deficiencies.  Immune System: Human studies have demonstrated that opioids have an immunomodulating effect. These effects are mediated via opioid receptors both on immune effector cells and in the central nervous system. Opioids have been demonstrated to have adverse effects on antimicrobial response and anti-tumour surveillance. Buprenorphine has been demonstrated to have no impact on immune function.  Opioid Induced Hyperalgesia: Human studies have demonstrated that prolonged use of opioids can lead to a state of abnormal pain sensitivity, sometimes called opioid induced hyperalgesia (OIH). Opioid induced hyperalgesia is not usually seen in the absence of tolerance to opioid analgesia. Clinically, hyperalgesia may be diagnosed if the patient on long-term opioid therapy presents with increased pain. This might be qualitatively and anatomically distinct from pain related to disease progression or to breakthrough pain resulting from development of opioid tolerance. Pain associated with hyperalgesia tends to be more diffuse than the  pre-existing pain and less defined in quality. Management of opioid induced hyperalgesia requires opioid dose reduction.  Cancer: Chronic opioid therapy has been associated with an increased risk of cancer among noncancer patients with chronic pain. This association was more evident in chronic strong opioid users. Chronic opioid consumption causes significant pathological changes in the small intestine and colon. Epidemiological studies have found that there is a link between opium dependence and initiation of gastrointestinal cancers. Cancer is the second leading cause of death after cardiovascular disease. Chronic use of opioids can cause multiple conditions such as GERD, immunosuppression and renal damage as well as carcinogenic effects, which are associated with the incidence of cancers.   Mortality: Long-term opioid use   has been associated with increased mortality among patients with chronic non-cancer pain (CNCP).  Prescription of long-acting opioids for chronic noncancer pain was associated with a significantly increased risk of all-cause mortality, including deaths from causes other than overdose.  Reference: Von Korff M, Kolodny A, Deyo RA, Chou R. Long-term opioid therapy reconsidered. Ann Intern Med. 2011 Sep 6;155(5):325-8. doi: 10.7326/[id number]. PMID: 21893626; PMCID: PMC3280085. Bedson J, Chen Y, Ashworth J, Hayward RA, Dunn KM, Jordan KP. Risk of adverse events in patients prescribed long-term opioids: A cohort study in the [place] Clinical Practice Research Datalink. Eur J Pain. 2019 May;23(5):908-922. doi: 10.1002/ejp.1357. Epub 2019 Jan 31. PMID: 30620116. Colameco S, Coren JS, Ciervo CA. Continuous opioid treatment for chronic noncancer pain: a time for moderation in prescribing. Postgrad Med. 2009 Jul;121(4):61-6. doi: 10.3810/pgm.2009.07.2032. PMID: 19641271. Chou R, Turner JA, Devine EB, Hansen RN, Sullivan SD, Blazina I, Dana T, Bougatsos C, Deyo RA. The  effectiveness and risks of long-term opioid therapy for chronic pain: a systematic review for a National Institutes of Health Pathways to Prevention Workshop. Ann Intern Med. 2015 Feb 17;162(4):276-86. doi: 10.7326/M14-2559. PMID: 25581257. Warner M, Chen LH, Makuc DM. NCHS Data Brief No. 22. [place]: Centers for Disease Control and Prevention; 2009. Sep, Increase in Fatal Poisonings Involving Opioid Analgesics in the [place], 1999-2006. Song IA, Choi HR, Oh TK. Long-term opioid use and mortality in patients with chronic non-cancer pain: Ten-year follow-up study in [place] from 2010 through 2019. EClinicalMedicine. 2022 Jul 18;51:101558. doi: 10.1016/j.eclinm.2022.101558. PMID: 35875817; PMCID: PMC9304910. Huser, W., Schubert, T., Vogelmann, T. et al. All-cause mortality in patients with long-term opioid therapy compared with non-opioid analgesics for chronic non-cancer pain: a database study. BMC Med 18, 162 (2020). https://doi.org/10.1186/[email or web address] Rashidian H, Zendehdel K, Kamangar F, Malekzadeh R, Haghdoost AA. An Ecological Study of the Association between Opiate Use and Incidence of Cancers. Addict Health. 2016 Fall;8(4):252-260. PMID: 28819556; PMCID: PMC5554805.  Our Goal: Our goal is to control your pain with means other than the use of opioid pain medications.  Our Recommendation: Talk to your physician about coming off of these medications. We can assist you with the tapering down and stopping these medicines. Based on the new information, even if you cannot completely stop the medication, a decrease in the dose may be associated with a lesser risk. Ask for other means of controlling the pain. Decrease or eliminate those factors that significantly contribute to your pain such as smoking, obesity, and a diet heavily tilted towards "inflammatory" nutrients.  Last Updated: 03/20/2022    ____________________________________________________________________________________________     ____________________________________________________________________________________________  Transfer of Pain Medication between Pharmacies  Re: 2023 DEA Clarification on existing regulation  Published on DEA Website: September 21, 2021  Title: Revised Regulation Allows DEA-Registered Pharmacies to Transfer Electronic Prescriptions at a Patient's Request DEA Headquarters Division - Public Information Office  "Patients now have the ability to request their electronic prescription be transferred to another pharmacy without having to go back to their practitioner to initiate the request. This revised regulation went into effect on Monday, September 17, 2021.     At a patient's request, a DEA-registered retail pharmacy can now transfer an electronic prescription for a controlled substance (schedules II-V) to another DEA-registered retail pharmacy. Prior to this change, patients would have to go through their practitioner to cancel their prescription and have it re-issued to a different pharmacy. The process was taxing and time consuming for both patients and practitioners.    The Drug Enforcement Administration (DEA) published its intent to revise the   process for transferring electronic prescriptions on December 10, 2019.  The final rule was published in the federal register on August 16, 2021 and went into effect 30 days later.  Under the final rule, a prescription can only be transferred once between pharmacies, and only if allowed under existing state or other applicable law. The prescription must remain in its electronic form; may not be altered in any way; and the transfer must be communicated directly between two licensed pharmacists. It's important to note, any authorized refills transfer with the original prescription, which means the entire prescription will be filled at the same pharmacy."     REFERENCES: 1. DEA website announcement https://www.dea.gov/stories/2023/2023-09/2021-09-01/revised-regulation-allows-dea-registered-pharmacies-transfer  2. Department of Justice website  https://www.govinfo.gov/content/pkg/FR-2021-08-16/pdf/2023-15847.pdf  3. DEPARTMENT OF JUSTICE Drug Enforcement Administration 21 CFR Part 1306 [Docket No. DEA-637] RIN [id number] "Transfer of Electronic Prescriptions for Schedules II-V Controlled Substances Between Pharmacies for Initial Filling"  ____________________________________________________________________________________________     _______________________________________________________________________  Medication Rules  Purpose: To inform patients, and their family members, of our medication rules and regulations.  Applies to: All patients receiving prescriptions from our practice (written or electronic).  Pharmacy of record: This is the pharmacy where your electronic prescriptions will be sent. Make sure we have the correct one.  Electronic prescriptions: In compliance with the [place] Strengthen Opioid Misuse Prevention (STOP) Act of 2017 (Session Law 2017-74/H243), effective January 21, 2018, all controlled substances must be electronically prescribed. Written prescriptions, faxing, or calling prescriptions to a pharmacy will no longer be done.  Prescription refills: These will be provided only during in-person appointments. No medications will be renewed without a "face-to-face" evaluation with your provider. Applies to all prescriptions.  NOTE: The following applies primarily to controlled substances (Opioid* Pain Medications).   Type of encounter (visit): For patients receiving controlled substances, face-to-face visits are required. (Not an option and not up to the patient.)  Patient's responsibilities: Pain Pills: Bring all pain pills to every appointment (except for procedure appointments). Pill Bottles: Bring  pills in original pharmacy bottle. Bring bottle, even if empty. Always bring the bottle of the most recent fill.  Medication refills: You are responsible for knowing and keeping track of what medications you are taking and when is it that you will need a refill. The day before your appointment: write a list of all prescriptions that need to be refilled. The day of the appointment: give the list to the admitting nurse. Prescriptions will be written only during appointments. No prescriptions will be written on procedure days. If you forget a medication: it will not be "Called in", "Faxed", or "electronically sent". You will need to get another appointment to get these prescribed. No early refills. Do not call asking to have your prescription filled early. Partial  or short prescriptions: Occasionally your pharmacy may not have enough pills to fill your prescription.  NEVER ACCEPT a partial fill or a prescription that is short of the total amount of pills that you were prescribed.  With controlled substances the law allows 72 hours for the pharmacy to complete the prescription.  If the prescription is not completed within 72 hours, the pharmacist will require a new prescription to be written. This means that you will be short on your medicine and we WILL NOT send another prescription to complete your original prescription.  Instead, request the pharmacy to send a carrier to a nearby branch to get enough medication to provide you with your full prescription. Prescription Accuracy: You are responsible for carefully inspecting your   prescriptions before leaving our office. Have the discharge nurse carefully go over each prescription with you, before taking them home. Make sure that your name is accurately spelled, that your address is correct. Check the name and dose of your medication to make sure it is accurate. Check the number of pills, and the written instructions to make sure they are clear and accurate. Make  sure that you are given enough medication to last until your next medication refill appointment. Taking Medication: Take medication as prescribed. When it comes to controlled substances, taking less pills or less frequently than prescribed is permitted and encouraged. Never take more pills than instructed. Never take the medication more frequently than prescribed.  Inform other Doctors: Always inform, all of your healthcare providers, of all the medications you take. Pain Medication from other Providers: You are not allowed to accept any additional pain medication from any other Doctor or Healthcare provider. There are two exceptions to this rule. (see below) In the event that you require additional pain medication, you are responsible for notifying us, as stated below. Cough Medicine: Often these contain an opioid, such as codeine or hydrocodone. Never accept or take cough medicine containing these opioids if you are already taking an opioid* medication. The combination may cause respiratory failure and death. Medication Agreement: You are responsible for carefully reading and following our Medication Agreement. This must be signed before receiving any prescriptions from our practice. Safely store a copy of your signed Agreement. Violations to the Agreement will result in no further prescriptions. (Additional copies of our Medication Agreement are available upon request.) Laws, Rules, & Regulations: All patients are expected to follow all Federal and State Laws, Statutes, Rules, & Regulations. Ignorance of the Laws does not constitute a valid excuse.  Illegal drugs and Controlled Substances: The use of illegal substances (including, but not limited to marijuana and its derivatives) and/or the illegal use of any controlled substances is strictly prohibited. Violation of this rule may result in the immediate and permanent discontinuation of any and all prescriptions being written by our practice. The use of  any illegal substances is prohibited. Adopted CDC guidelines & recommendations: Target dosing levels will be at or below 60 MME/day. Use of benzodiazepines** is not recommended.  Exceptions: There are only two exceptions to the rule of not receiving pain medications from other Healthcare Providers. Exception #1 (Emergencies): In the event of an emergency (i.e.: accident requiring emergency care), you are allowed to receive additional pain medication. However, you are responsible for: As soon as you are able, call our office [phone], at any time of the day or night, and leave a message stating your name, the date and nature of the emergency, and the name and dose of the medication prescribed. In the event that your call is answered by a member of our staff, make sure to document and save the date, time, and the name of the person that took your information.  Exception #2 (Planned Surgery): In the event that you are scheduled by another doctor or dentist to have any type of surgery or procedure, you are allowed (for a period no longer than 30 days), to receive additional pain medication, for the acute post-op pain. However, in this case, you are responsible for picking up a copy of our "Post-op Pain Management for Surgeons" handout, and giving it to your surgeon or dentist. This document is available at our office, and does not require an appointment to obtain it. Simply go to   our office during business hours (Monday-Thursday from 8:00 AM to 4:00 PM) (Friday 8:00 AM to 12:00 Noon) or if you have a scheduled appointment with us, prior to your surgery, and ask for it by name. In addition, you are responsible for: calling our office [phone], at any time of the day or night, and leaving a message stating your name, name of your surgeon, type of surgery, and date of procedure or surgery. Failure to comply with your responsibilities may result in termination of therapy involving the controlled  substances. Medication Agreement Violation. Following the above rules, including your responsibilities will help you in avoiding a Medication Agreement Violation ("Breaking your Pain Medication Contract").  Consequences:  Not following the above rules may result in permanent discontinuation of medication prescription therapy.  *Opioid medications include: morphine, codeine, oxycodone, oxymorphone, hydrocodone, hydromorphone, meperidine, tramadol, tapentadol, buprenorphine, fentanyl, methadone. **Benzodiazepine medications include: diazepam (Valium), alprazolam (Xanax), clonazepam (Klonopine), lorazepam (Ativan), clorazepate (Tranxene), chlordiazepoxide (Librium), estazolam (Prosom), oxazepam (Serax), temazepam (Restoril), triazolam (Halcion) (Last updated: 11/13/2021) ______________________________________________________________________    ______________________________________________________________________  Medication Recommendations and Reminders  Applies to: All patients receiving prescriptions (written and/or electronic).  Medication Rules & Regulations: You are responsible for reading, knowing, and following our "Medication Rules" document. These exist for your safety and that of others. They are not flexible and neither are we. Dismissing or ignoring them is an act of "non-compliance" that may result in complete and irreversible termination of such medication therapy. For safety reasons, "non-compliance" will not be tolerated. As with the [place] fundamental legal principle of "ignorance of the law is no defense", we will accept no excuses for not having read and knowing the content of documents provided to you by our practice.  Pharmacy of record:  Definition: This is the pharmacy where your electronic prescriptions will be sent.  We do not endorse any particular pharmacy. It is up to you and your insurance to decide what pharmacy to use.  We do not restrict you in your choice of  pharmacy. However, once we write for your prescriptions, we will NOT be re-sending more prescriptions to fix restricted supply problems created by your pharmacy, or your insurance.  The pharmacy listed in the electronic medical record should be the one where you want electronic prescriptions to be sent. If you choose to change pharmacy, simply notify our nursing staff. Changes will be made only during your regular appointments and not over the phone.  Recommendations: Keep all of your pain medications in a safe place, under lock and key, even if you live alone. We will NOT replace lost, stolen, or damaged medication. We do not accept "Police Reports" as proof of medications having been stolen. After you fill your prescription, take 1 week's worth of pills and put them away in a safe place. You should keep a separate, properly labeled bottle for this purpose. The remainder should be kept in the original bottle. Use this as your primary supply, until it runs out. Once it's gone, then you know that you have 1 week's worth of medicine, and it is time to come in for a prescription refill. If you do this correctly, it is unlikely that you will ever run out of medicine. To make sure that the above recommendation works, it is very important that you make sure your medication refill appointments are scheduled at least 1 week before you run out of medicine. To do this in an effective manner, make sure that you do not leave the office without   scheduling your next medication management appointment. Always ask the nursing staff to show you in your prescription , when your medication will be running out. Then arrange for the receptionist to get you a return appointment, at least 7 days before you run out of medicine. Do not wait until you have 1 or 2 pills left, to come in. This is very poor planning and does not take into consideration that we may need to cancel appointments due to bad weather, sickness, or emergencies  affecting our staff. DO NOT ACCEPT A "Partial Fill": If for any reason your pharmacy does not have enough pills/tablets to completely fill or refill your prescription, do not allow for a "partial fill". The law allows the pharmacy to complete that prescription within 72 hours, without requiring a new prescription. If they do not fill the rest of your prescription within those 72 hours, you will need a separate prescription to fill the remaining amount, which we will NOT provide. If the reason for the partial fill is your insurance, you will need to talk to the pharmacist about payment alternatives for the remaining tablets, but again, DO NOT ACCEPT A PARTIAL FILL, unless you can trust your pharmacist to obtain the remainder of the pills within 72 hours.  Prescription refills and/or changes in medication(s):  Prescription refills, and/or changes in dose or medication, will be conducted only during scheduled medication management appointments. (Applies to both, written and electronic prescriptions.) No refills on procedure days. No medication will be changed or started on procedure days. No changes, adjustments, and/or refills will be conducted on a procedure day. Doing so will interfere with the diagnostic portion of the procedure. No phone refills. No medications will be "called into the pharmacy". No Fax refills. No weekend refills. No Holliday refills. No after hours refills.  Remember:  Business hours are:  Monday to Thursday 8:00 AM to 4:00 PM Provider's Schedule: Kenedi Cilia, MD - Appointments are:  Medication management: Monday and Wednesday 8:00 AM to 4:00 PM Procedure day: Tuesday and Thursday 7:30 AM to 4:00 PM Bilal Lateef, MD - Appointments are:  Medication management: Tuesday and Thursday 8:00 AM to 4:00 PM Procedure day: Monday and Wednesday 7:30 AM to 4:00 PM (Last update: 11/13/2021) ______________________________________________________________________     ____________________________________________________________________________________________  Drug Holidays  What is a "Drug Holiday"? Drug Holiday: is the name given to the process of slowly tapering down and temporarily stopping the pain medication for the purpose of decreasing or eliminating tolerance to the drug.  Benefits Improved effectiveness Decreased required effective dose Improved pain control End dependence on high dose therapy Decrease cost of therapy Uncovering "opioid-induced hyperalgesia". (OIH)  What is "opioid hyperalgesia"? It is a paradoxical increase in pain caused by exposure to opioids. Stopping the opioid pain medication, contrary to the expected, it actually decreases or completely eliminates the pain. Ref.: "A comprehensive review of opioid-induced hyperalgesia". Marion Lee, et.al. Pain Physician. 2011 Mar-Apr;14(2):145-61.  What is tolerance? Tolerance: the progressive loss of effectiveness of a pain medicine due to repetitive use. A common problem of opioid pain medications.  How long should a "Drug Holiday" last? Effectiveness depends on the patient staying off all opioid pain medicines for a minimum of 14 consecutive days. (2 weeks)  How about just taking less of the medicine? Does not work. Will not accomplish goal of eliminating the excess receptors.  How about switching to a different pain medicine? (AKA. "Opioid rotation") Does not work. Creates the illusion of effectiveness by taking advantage of   inaccurate equivalent dose calculations between different opioids. -This "technique" was promoted by studies funded by pharmaceutical companies, such as [employer], creators of "OxyContin".  Can I stop the medicine "cold turkey"? We do not recommend it. You should always coordinate with your prescribing physician to make the transition as smoothly as possible. Avoid stopping the medicine abruptly without consulting. We recommend a "slow taper".  What  is a slow taper? Taper: refers to the gradual decrease in dose.   How do I stop/taper the dose? Slowly. Decrease the daily amount of pills that you take by one (1) pill every seven (7) days. This is called a "slow downward taper". Example: if you normally take four (4) pills per day, drop it to three (3) pills per day for seven (7) days, then to two (2) pills per day for seven (7) days, then to one (1) per day for seven (7) days, and then stop the medicine. The 14 day "Drug Holiday" starts on the first day without medicine.   Will I experience withdrawals? Unlikely with a slow taper.  What triggers withdrawals? Withdrawals are triggered by the sudden/abrupt stop of high dose opioids. Withdrawals can be avoided by slowly decreasing the dose over a prolonged period of time.  What are withdrawals? Symptoms associated with sudden/abrupt reduction/stopping of high-dose, long-term use of pain medication. Withdrawal are seldom seen on low dose therapy, or patients rarely taking opioid medication.  Early Withdrawal Symptoms may include: Agitation Anxiety Muscle aches Increased tearing Insomnia Runny nose Sweating Yawning  Late symptoms may include: Abdominal cramping Diarrhea Dilated pupils Goose bumps Nausea Vomiting  When could I see withdrawals? Onset: 8-24 hours after last use for most opioids. 12-48 hours for long-acting opioids (i.e.: methadone)  How long could they last? Duration: 4-10 days for most opioids. 14-21 days for long-acting opioids (i.e.: methadone)  What will happen after I complete my "Drug Holiday"? The need and indications for the opioid analgesic will be reviewed before restarting the medication. Dose requirements will likely decrease and the dose will need to be adjusted accordingly.   (Last update: 04/10/2022) ____________________________________________________________________________________________    ____________________________________________________________________________________________  Naloxone Nasal Spray  Why am I receiving this medication? [place] STOP ACT requires that all patients taking high dose opioids or at risk of opioids respiratory depression, be prescribed an opioid reversal agent, such as Naloxone (AKA: Narcan).  What is this medication? NALOXONE (nal OX one) treats opioid overdose, which causes slow or shallow breathing, severe drowsiness, or trouble staying awake. Call emergency services after using this medication. You may need additional treatment. Naloxone works by reversing the effects of opioids. It belongs to a group of medications called opioid blockers.  COMMON BRAND NAME(S): Kloxxado, Narcan  What should I tell my care team before I take this medication? They need to know if you have any of these conditions: Heart disease Substance use disorder An unusual or allergic reaction to naloxone, other medications, foods, dyes, or preservatives Pregnant or trying to get pregnant Breast-feeding  When to use this medication? This medication is to be used for the treatment of respiratory depression (less than 8 breaths per minute) secondary to opioid overdose.   How to use this medication? This medication is for use in the nose. Lay the person on their back. Support their neck with your hand and allow the head to tilt back before giving the medication. The nasal spray should be given into 1 nostril. After giving the medication, move the person onto their side.   Do not remove or test the nasal spray until ready to use. Get emergency medical help right away after giving the first dose of this medication, even if the person wakes up. You should be familiar with how to recognize the signs and symptoms of a narcotic overdose. If more doses are needed, give the additional dose in the other nostril. Talk to your care team about the use of this medication in children.  While this medication may be prescribed for children as young as newborns for selected conditions, precautions do apply.  Naloxone Overdosage: If you think you have taken too much of this medicine contact a poison control center or emergency room at once.  NOTE: This medicine is only for you. Do not share this medicine with others.  What if I miss a dose? This does not apply.  What may interact with this medication? This is only used during an emergency. No interactions are expected during emergency use. This list may not describe all possible interactions. Give your health care provider a list of all the medicines, herbs, non-prescription drugs, or dietary supplements you use. Also tell them if you smoke, drink alcohol, or use illegal drugs. Some items may interact with your medicine.  What should I watch for while using this medication? Keep this medication ready for use in the case of an opioid overdose. Make sure that you have the phone number of your care team and local hospital ready. You may need to have additional doses of this medication. Each nasal spray contains a single dose. Some emergencies may require additional doses. After use, bring the treated person to the nearest hospital or call 911. Make sure the treating care team knows that the person has received a dose of this medication. You will receive additional instructions on what to do during and after use of this medication before an emergency occurs.  What side effects may I notice from receiving this medication? Side effects that you should report to your care team as soon as possible: Allergic reactions--skin rash, itching, hives, swelling of the face, lips, tongue, or throat Side effects that usually do not require medical attention (report these to your care team if they continue or are bothersome): Constipation Dryness or irritation inside the nose Headache Increase in blood pressure Muscle spasms Stuffy  nose Toothache This list may not describe all possible side effects. Call your doctor for medical advice about side effects. You may report side effects to FDA at 1-800-FDA-1088.  Where should I keep my medication? Because this is an emergency medication, you should keep it with you at all times.  Keep out of the reach of children and pets. Store between 20 and 25 degrees C (68 and 77 degrees F). Do not freeze. Throw away any unused medication after the expiration date. Keep in original box until ready to use.  NOTE: This sheet is a summary. It may not cover all possible information. If you have questions about this medicine, talk to your doctor, pharmacist, or health care provider.   2023 Elsevier/Gold Standard (2020-09-15 00:00:00)  ____________________________________________________________________________________________   

## 2022-07-29 ENCOUNTER — Ambulatory Visit (HOSPITAL_BASED_OUTPATIENT_CLINIC_OR_DEPARTMENT_OTHER): Payer: Medicaid Other | Admitting: Pain Medicine

## 2022-07-29 ENCOUNTER — Ambulatory Visit
Admission: RE | Admit: 2022-07-29 | Discharge: 2022-07-29 | Disposition: A | Discharge status: Home / Self Care | Payer: Medicaid Other | Source: Ambulatory Visit | Attending: Family Medicine | Admitting: Family Medicine

## 2022-07-29 VITALS — BP 139/84 | HR 84 | Temp 98.4°F | Resp 16 | Ht 63.0 in | Wt 155.0 lb

## 2022-07-29 DIAGNOSIS — Z91199 Patient's noncompliance with other medical treatment and regimen due to unspecified reason: Secondary | ICD-10-CM

## 2022-07-29 DIAGNOSIS — J439 Emphysema, unspecified: Secondary | ICD-10-CM | POA: Diagnosis not present

## 2022-07-29 MED ORDER — LEVOFLOXACIN 750 MG PO TABS: 750.0000 mg | ORAL_TABLET | Freq: Every day | ORAL | 0 refills | Status: AC

## 2022-07-29 MED ORDER — PROMETHAZINE-DM 6.25-15 MG/5ML PO SYRP: 5.0000 mL | ORAL_SOLUTION | Freq: Four times a day (QID) | ORAL | 0 refills | Status: DC | PRN

## 2022-07-29 MED ORDER — PREDNISONE 10 MG (21) PO TBPK: ORAL_TABLET | Freq: Every day | ORAL | 0 refills | Status: DC

## 2022-07-29 NOTE — ED Triage Notes (Signed)
Pt c/o cough x5 days. Has tried tylenol & inhaler w/o relief. Hx of bronchitis. Denies any SOB or wheezing.

## 2022-07-29 NOTE — Discharge Instructions (Addendum)
Stop by the pharmacy to pick up your prescriptions.  Follow up with your primary care provider, as needed.

## 2022-07-29 NOTE — ED Provider Notes (Signed)
[facility]    CSN: [id number] Arrival date & time: 07/29/22  1003      History   Chief Complaint Chief Complaint  Patient presents with   Cough    Appt    HPI Tanya [name] is a 62 y.o. female.   HPI  History obtained from {source of history:310783}. Tanya [name] presents for deep cough that started about 5 days ago. Has some bad coughing fits at night that causes her to vomit. Has been smoking for over 40 years.   She had neck surgery that made her have a headache and the coughing is making her pain worse. Taking tylenol.    Fever : no  Chills: no Sore throat: no   Cough: no Sputum: no Chest tightness: no Shortness of breath: no Wheezing: no  Nasal congestion : no  Rhinorrhea: no Myalgias: no Appetite: normal  Hydration: normal  Abdominal pain: no Nausea: no Vomiting: no Diarrhea: No Rash: No Sleep disturbance: no Headache: no      Past Medical History:  Diagnosis Date   Anxiety    Bladder infection    8/18   Chronic lower back pain    Chronic neck pain    Collagen vascular disease (HCC)    COVID-19    Depression    Diverticulitis    GERD (gastroesophageal reflux disease)    Hyperlipidemia    Lupus (HCC)    Overactive bladder     Patient Active Problem List   Diagnosis Date Noted   Cervical facet joint pain 05/21/2022   Chronic anticoagulation (Plaquenil) 05/01/2022   Trigger point with neck pain 10/08/2021   Acute upper back pain (Midline) 10/08/2021   Cervicothoracic interspinous bursitis 10/08/2021   Prediabetes 08/13/2021   MDD (major depressive disorder), recurrent, in full remission (HCC) 07/19/2021   Moderate episode of recurrent major depressive disorder (HCC) 03/23/2021   Tobacco use disorder 03/23/2021   Insomnia 02/06/2021   At risk for prolonged QT interval syndrome 02/06/2021   Psychiatric illness 11/01/2020   Chronic use of opiate for therapeutic purpose 06/25/2020   Arthropathy of cervical facet joint  (Bilateral) 04/13/2020   Cervical spine pain 04/13/2020   Abnormal MRI, cervical spine (09/20/2015) 04/13/2020   Cervical central spinal stenosis (Multilevel) 04/13/2020   Cervical foraminal stenosis (Right: C3-4) (Bilateral: C4-5, C5-6) 04/13/2020   DDD (degenerative disc disease), cervical 03/14/2020   Chronic shoulder pain (Left) 09/06/2019   Osteoarthritis of shoulder (Left) 09/06/2019   Osteoarthritis of shoulders (Bilateral) (R>L) 10/25/2018   Osteoarthritis of AC (acromioclavicular) joint (Right) 04/02/2018   Chronic acromioclavicular joint pain (Right) 04/02/2018   Chronic shoulder pain (Right) 04/02/2018   Epicondylitis elbow, medial (Left) 04/02/2018   Chronic elbow pain (Left) 03/25/2018   Occipital headache (Left) 12/10/2017   Neurogenic pain 12/10/2017   Cervico-occipital neuralgia (Left) 12/10/2017   Pharmacologic therapy 09/01/2017   Disorder of skeletal system 09/01/2017   Problems influencing health status 09/01/2017   Spondylosis without myelopathy or radiculopathy, cervical region 08/05/2017   Cervicalgia 08/05/2017   Rash 05/13/2017   Acute postoperative pain 06/26/2016   Cervical spondylosis 04/15/2016   Cervical facet syndrome (Bilateral) (R>L) 04/15/2016   Shoulder radicular pain (Bilateral) (R>L) 04/15/2016   Chronic musculoskeletal pain 03/07/2016   Vitamin D insufficiency 12/19/2015   Anxiety 11/23/2015   Chronic low back pain (3ry area of Pain) (Bilateral) (R>L) w/o sciatica 11/23/2015   Chronic neck pain (1ry area of Pain) (Bilateral) (R>L) 11/23/2015   Long term prescription benzodiazepine use 11/23/2015  Long term current use of opiate analgesic 11/23/2015   Long term prescription opiate use 11/23/2015   Opiate use (10 MME/Day) 11/23/2015   Chronic pain syndrome 11/23/2015   Chronic upper extremity pain (Right) 11/23/2015   Chronic cervical radicular pain (Right) 11/23/2015   Chronic shoulder pain (Bilateral) (R>L) 11/23/2015    Hypercholesterolemia 06/20/2015   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   Encounter for long-term (current) use of high-risk medication 06/06/2015   Pain medication agreement signed 11/24/2014   Hyperlipidemia 01/25/2014   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   Diverticulitis 10/04/2012   Osteoarthritis 10/04/2012   Chronic upper back pain (2ry area of Pain) (Bilateral) (R>L) 08/17/2012   Urinary incontinence 08/17/2012   Fibromyalgia 06/04/2012   SLE (systemic lupus erythematosus) (HCC) 02/18/2012   GAD (generalized anxiety disorder) 01/07/2012   Major depressive disorder, recurrent (HCC) 09/29/2009    Past Surgical History:  Procedure Laterality Date   ABDOMINAL HYSTERECTOMY     COLONOSCOPY     COLONOSCOPY WITH PROPOFOL N/A 07/03/2017   Procedure: COLONOSCOPY WITH PROPOFOL;  Surgeon: Christena Deem, MD;  Location: [facility] ENDOSCOPY;  Service: Endoscopy;  Laterality: N/A;   COLONOSCOPY WITH PROPOFOL N/A 10/21/2017   Procedure: COLONOSCOPY WITH PROPOFOL;  Surgeon: Christena Deem, MD;  Location: [facility] ENDOSCOPY;  Service: Endoscopy;  Laterality: N/A;   ESOPHAGOGASTRODUODENOSCOPY N/A 10/21/2017   Procedure: ESOPHAGOGASTRODUODENOSCOPY (EGD);  Surgeon: Christena Deem, MD;  Location: [facility] ENDOSCOPY;  Service: Endoscopy;  Laterality: N/A;   TONSILLECTOMY      OB History   No obstetric history on file.      Home Medications    Prior to Admission medications   Medication Sig Start Date End Date Taking? Authorizing Provider  atorvastatin (LIPITOR) 80 MG tablet Take 80 mg by mouth every evening. 04/19/20  Yes [provider]  baclofen (LIORESAL) 10 MG tablet Take 1 tablet by mouth at bedtime. 06/27/22 06/27/23 Yes [provider]  clobetasol cream (TEMOVATE) 0.05 % Apply 1 Application topically every 3 (three) days.   Yes [provider]  DULoxetine (CYMBALTA) 30 MG capsule TAKE 1 CAPSULE BY MOUTH ONCE DAILY 05/14/22  Yes Eappen, Levin Bacon, MD  gabapentin  (NEURONTIN) 300 MG capsule Take 1 capsule (300 mg total) by mouth 3 (three) times daily. 06/26/20 03/03/23 Yes Delano Metz, MD  hydroxychloroquine (PLAQUENIL) 200 MG tablet Take 200 mg by mouth in the morning. 01/04/21  Yes [provider]  hydrOXYzine (VISTARIL) 25 MG capsule TAKE 1-2 CAPSULES BY MOUTH ONCE DAILY ASNEEDED FOR ANXIETY OR SLEEP 07/15/22  Yes Darcel Smalling, MD  mycophenolate (CELLCEPT) 500 MG tablet Take by mouth. 07/02/22 07/02/23 Yes [provider]  naloxone (NARCAN) nasal spray 4 mg/0.1 mL Place 1 spray into the nose as needed for up to 365 doses (for opioid-induced respiratory depresssion). In case of emergency (overdose), spray once into each nostril. If no response within 3 minutes, repeat application and call 911. 12/31/21 12/31/22 Yes Delano Metz, MD  nicotine (NICODERM CQ - DOSED IN MG/24 HOURS) 14 mg/24hr patch Place 1 patch (14 mg total) onto the skin daily. 03/07/22  Yes Jomarie Longs, MD  oxyCODONE (OXY IR/ROXICODONE) 5 MG immediate release tablet Take 1 tablet (5 mg total) by mouth 2 (two) times daily as needed for severe pain. Must last 30 days 07/05/22 08/04/22 Yes Delano Metz, MD  VENTOLIN HFA 108 [phone] Base) MCG/ACT inhaler INHALE 2 PUFFS INTO THE LUNGS EVERY 6 HOURS AS NEEDED FOR WHEEZING OR SHORTNESS OF BREATH 06/19/22  Yes Sarina Ser  L, MD    Family History Family History  Problem Relation Age of Onset   Depression Mother    Alcohol abuse Mother    Cancer Mother    Hypertension Mother    Emphysema Mother    Stroke Mother    Glaucoma Father    Heart disease Father    Diabetes Brother    Drug abuse Son     Social History Social History   Tobacco Use   Smoking status: Every Day    Packs/day: 1.00    Years: 35.00    Additional pack years: 0.00    Total pack years: 35.00    Types: Cigarettes    Passive exposure: Past   Smokeless tobacco: Never   Tobacco comments:    1ppd - 03/01/2021  Vaping Use   Vaping Use:  Never used  Substance Use Topics   Alcohol use: No   Drug use: No     Allergies   Pregabalin, Trazodone, Bupropion, Methocarbamol, and Tramadol   Review of Systems Review of Systems: negative unless otherwise stated in HPI.      Physical Exam Triage Vital Signs ED Triage Vitals  Enc Vitals Group     BP 07/29/22 1017 139/84     Pulse Rate 07/29/22 1017 84     Resp 07/29/22 1017 16     Temp 07/29/22 1017 98.4 F (36.9 C)     Temp Source 07/29/22 1017 Oral     SpO2 07/29/22 1017 92 %     Weight 07/29/22 1016 155 lb (70.3 kg)     Height 07/29/22 1016 5\' 3"  (1.6 m)     Head Circumference --      Peak Flow --      Pain Score 07/29/22 1022 0     Pain Loc --      Pain Edu? --      Excl. in GC? --    No data found.  Updated Vital Signs BP 139/84 (BP Location: Right Arm)   Pulse 84   Temp 98.4 F (36.9 C) (Oral)   Resp 16   Ht 5\' 3"  (1.6 m)   Wt 70.3 kg   SpO2 92%   BMI 27.46 kg/m   Visual Acuity Right Eye Distance:   Left Eye Distance:   Bilateral Distance:    Right Eye Near:   Left Eye Near:    Bilateral Near:     Physical Exam GEN:     alert, non-toxic appearing female in no distress ***   HENT:  mucus membranes moist, oropharyngeal ***without lesions or ***erythema, no*** tonsillar hypertrophy or exudates, *** moderate erythematous edematous turbinates, ***clear nasal discharge, ***bilateral TM normal EYES:   pupils equal and reactive, ***no scleral injection or discharge NECK:  normal ROM, no ***lymphadenopathy, ***no meningismus   RESP:  no increased work of breathing, ***clear to auscultation bilaterally CVS:   regular rate ***and rhythm Skin:   warm and dry, no rash on visible skin***    UC Treatments / Results  Labs (all labs ordered are listed, but only abnormal results are displayed) Labs Reviewed - No data to display  EKG   Radiology No results found.  Procedures Procedures (including critical care time)  Medications Ordered in  UC Medications - No data to display  Initial Impression / Assessment and Plan / UC Course  I have reviewed the triage vital signs and the nursing notes.  Pertinent labs & imaging results that were available during my care of  the patient were reviewed by me and considered in my medical decision making (see chart for details).      Pt is a 62 y.o. female who presents for *** weeks of cough that is not improving.     CT Chest in Feb 2024 showed Stable irregular part solid nodule of the left upper lobe, consistent with indolent primary lung adenocarcinoma. 2. Additional small solid and subsolid pulmonary nodules are stable. Previous CT in May 2023 showed emphysema.   Anaiyah is  ***afebrile here without recent antipyretics. Satting ***adequately on room air. Overall pt is  non-toxic appearing, well hydrated, without respiratory distress. Pulmonary exam is ***remarkable for rhonchi that clear with cough.  After shared decision making, we will ***not pursue chest x-ray at this time as it currently would not change management.  COVID  and influenza testing deferred due to length of symptoms. ***Home COVID testing was negative.   Treat acute bronchitis with ***steroids and antibiotics as below.  ***Promethazine DM cough syrup given for cough and allow patient to rest.  Typical duration of symptoms discussed. Return and ED precautions given and patient voiced understanding.   Discussed MDM, treatment plan and plan for follow-up with patient who agrees with plan.      Final Clinical Impressions(s) / UC Diagnoses   Final diagnoses:  None   Discharge Instructions   None    ED Prescriptions   None    PDMP not reviewed this encounter.

## 2022-08-15 ENCOUNTER — Telehealth: Payer: Self-pay | Admitting: Pain Medicine

## 2022-08-15 NOTE — Telephone Encounter (Signed)
Reports numbness in the head, but no pain relief in the shoulder or neck. Denies any fever or neurological symptoms. I reassured her that this may be expected for several weeks following a RFA, instructed her to document symptoms on the post procedure diary, and discuss with Dr. Laban Emperor at follow-up.

## 2022-08-15 NOTE — Telephone Encounter (Signed)
Please call patient regarding last procedure she is having some complications and needs to talk to someone about them

## 2022-08-23 ENCOUNTER — Other Ambulatory Visit: Payer: Self-pay | Admitting: Infectious Diseases

## 2022-08-23 DIAGNOSIS — R911 Solitary pulmonary nodule: Secondary | ICD-10-CM

## 2022-08-23 DIAGNOSIS — J439 Emphysema, unspecified: Secondary | ICD-10-CM

## 2022-08-29 ENCOUNTER — Encounter: Payer: Self-pay | Admitting: Pain Medicine

## 2022-08-29 ENCOUNTER — Ambulatory Visit: Payer: Medicaid Other | Attending: Pain Medicine | Admitting: Pain Medicine

## 2022-08-29 VITALS — BP 148/87 | HR 91 | Temp 98.2°F | Resp 16 | Ht 63.0 in | Wt 155.0 lb

## 2022-08-29 DIAGNOSIS — M542 Cervicalgia: Secondary | ICD-10-CM

## 2022-08-29 DIAGNOSIS — M47812 Spondylosis without myelopathy or radiculopathy, cervical region: Secondary | ICD-10-CM | POA: Insufficient documentation

## 2022-08-29 DIAGNOSIS — M7918 Myalgia, other site: Secondary | ICD-10-CM | POA: Insufficient documentation

## 2022-08-29 DIAGNOSIS — G8918 Other acute postprocedural pain: Secondary | ICD-10-CM | POA: Insufficient documentation

## 2022-08-29 DIAGNOSIS — M25512 Pain in left shoulder: Secondary | ICD-10-CM | POA: Diagnosis present

## 2022-08-29 DIAGNOSIS — M545 Low back pain, unspecified: Secondary | ICD-10-CM | POA: Diagnosis present

## 2022-08-29 DIAGNOSIS — G894 Chronic pain syndrome: Secondary | ICD-10-CM

## 2022-08-29 DIAGNOSIS — M549 Dorsalgia, unspecified: Secondary | ICD-10-CM

## 2022-08-29 DIAGNOSIS — G8929 Other chronic pain: Secondary | ICD-10-CM | POA: Insufficient documentation

## 2022-08-29 DIAGNOSIS — Z09 Encounter for follow-up examination after completed treatment for conditions other than malignant neoplasm: Secondary | ICD-10-CM | POA: Diagnosis present

## 2022-08-29 DIAGNOSIS — Z79899 Other long term (current) drug therapy: Secondary | ICD-10-CM | POA: Diagnosis present

## 2022-08-29 DIAGNOSIS — Z79891 Long term (current) use of opiate analgesic: Secondary | ICD-10-CM

## 2022-08-29 DIAGNOSIS — M25511 Pain in right shoulder: Secondary | ICD-10-CM | POA: Diagnosis present

## 2022-08-29 MED ORDER — OXYCODONE HCL 5 MG PO TABS
5.0000 mg | ORAL_TABLET | Freq: Two times a day (BID) | ORAL | 0 refills | Status: DC | PRN
Start: 2022-09-28 — End: 2022-11-20

## 2022-08-29 MED ORDER — OXYCODONE HCL 5 MG PO TABS
5.0000 mg | ORAL_TABLET | Freq: Two times a day (BID) | ORAL | 0 refills | Status: DC | PRN
Start: 2022-08-29 — End: 2022-11-20

## 2022-08-29 MED ORDER — OXYCODONE HCL 5 MG PO TABS
5.0000 mg | ORAL_TABLET | Freq: Two times a day (BID) | ORAL | 0 refills | Status: DC | PRN
Start: 2022-10-28 — End: 2022-11-20

## 2022-08-29 MED ORDER — PREDNISONE 20 MG PO TABS
ORAL_TABLET | ORAL | 0 refills | Status: AC
Start: 2022-08-29 — End: 2022-09-07

## 2022-08-29 NOTE — Patient Instructions (Signed)
____________________________________________________________________________________________  Opioid Pain Medication Update  To: All patients taking opioid pain medications. (I.e.: hydrocodone, hydromorphone, oxycodone, oxymorphone, morphine, codeine, methadone, tapentadol, tramadol, buprenorphine, fentanyl, etc.)  Re: Updated review of side effects and adverse reactions of opioid analgesics, as well as new information about long term effects of this class of medications.  Direct risks of long-term opioid therapy are not limited to opioid addiction and overdose. Potential medical risks include serious fractures, breathing problems during sleep, hyperalgesia, immunosuppression, chronic constipation, bowel obstruction, myocardial infarction, and tooth decay secondary to xerostomia.  Unpredictable adverse effects that can occur even if you take your medication correctly: Cognitive impairment, respiratory depression, and death. Most people think that if they take their medication "correctly", and "as instructed", that they will be safe. Nothing could be farther from the truth. In reality, a significant amount of recorded deaths associated with the use of opioids has occurred in individuals that had taken the medication for a long time, and were taking their medication correctly. The following are examples of how this can happen: Patient taking his/her medication for a long time, as instructed, without any side effects, is given a certain antibiotic or another unrelated medication, which in turn triggers a "Drug-to-drug interaction" leading to disorientation, cognitive impairment, impaired reflexes, respiratory depression or an untoward event leading to serious bodily harm or injury, including death.  Patient taking his/her medication for a long time, as instructed, without any side effects, develops an acute impairment of liver and/or kidney function. This will lead to a rapid inability of the body to  breakdown and eliminate their pain medication, which will result in effects similar to an "overdose", but with the same medicine and dose that they had always taken. This again may lead to disorientation, cognitive impairment, impaired reflexes, respiratory depression or an untoward event leading to serious bodily harm or injury, including death.  A similar problem will occur with patients as they grow older and their liver and kidney function begins to decrease as part of the aging process.  Background information: Historically, the original case for using long-term opioid therapy to treat chronic noncancer pain was based on safety assumptions that subsequent experience has called into question. In 1996, the American Pain Society and the American Academy of Pain Medicine issued a consensus statement supporting long-term opioid therapy. This statement acknowledged the dangers of opioid prescribing but concluded that the risk for addiction was low; respiratory depression induced by opioids was short-lived, occurred mainly in opioid-naive patients, and was antagonized by pain; tolerance was not a common problem; and efforts to control diversion should not constrain opioid prescribing. This has now proven to be wrong. Experience regarding the risks for opioid addiction, misuse, and overdose in community practice has failed to support these assumptions.  According to the Centers for Disease Control and Prevention, fatal overdoses involving opioid analgesics have increased sharply over the past decade. Currently, more than 96,700 people die from drug overdoses every year. Opioids are a factor in 7 out of every 10 overdose deaths. Deaths from drug overdose have surpassed motor vehicle accidents as the leading cause of death for individuals between the ages of 80 and 61.  Clinical data suggest that neuroendocrine dysfunction may be very common in both men and women, potentially causing hypogonadism, erectile  dysfunction, infertility, decreased libido, osteoporosis, and depression. Recent studies linked higher opioid dose to increased opioid-related mortality. Controlled observational studies reported that long-term opioid therapy may be associated with increased risk for cardiovascular events. Subsequent meta-analysis concluded  that the safety of long-term opioid therapy in elderly patients has not been proven.   Side Effects and adverse reactions: Common side effects: Drowsiness (sedation). Dizziness. Nausea and vomiting. Constipation. Physical dependence -- Dependence often manifests with withdrawal symptoms when opioids are discontinued or decreased. Tolerance -- As you take repeated doses of opioids, you require increased medication to experience the same effect of pain relief. Respiratory depression -- This can occur in healthy people, especially with higher doses. However, people with COPD, asthma or other lung conditions may be even more susceptible to fatal respiratory impairment.  Uncommon side effects: An increased sensitivity to feeling pain and extreme response to pain (hyperalgesia). Chronic use of opioids can lead to this. Delayed gastric emptying (the process by which the contents of your stomach are moved into your small intestine). Muscle rigidity. Immune system and hormonal dysfunction. Quick, involuntary muscle jerks (myoclonus). Arrhythmia. Itchy skin (pruritus). Dry mouth (xerostomia).  Long-term side effects: Chronic constipation. Sleep-disordered breathing (SDB). Increased risk of bone fractures. Hypothalamic-pituitary-adrenal dysregulation. Increased risk of overdose.  RISKS: Respiratory depression and death: Opioids increase the risk of respiratory depression and death.  Drug-to-drug interactions: Opioids are relatively contraindicated in combination with benzodiazepines, sleep inducers, and other central nervous system depressants. Other classes of medications  (i.e.: certain antibiotics and even over-the-counter medications) may also trigger or induce respiratory depression in some patients.  Medical conditions: Patients with pre-existing respiratory problems are at higher risk of respiratory failure and/or depression when in combination with opioid analgesics. Opioids are relatively contraindicated in some medical conditions such as central sleep apnea.   Fractures and Falls:  Opioids increase the risk and incidence of falls. This is of particular importance in elderly patients.  Endocrine System:  Long-term administration is associated with endocrine abnormalities (endocrinopathies). (Also known as Opioid-induced Endocrinopathy) Influences on both the hypothalamic-pituitary-adrenal axis?and the hypothalamic-pituitary-gonadal axis have been demonstrated with consequent hypogonadism and adrenal insufficiency in both sexes. Hypogonadism and decreased levels of dehydroepiandrosterone sulfate have been reported in men and women. Endocrine effects include: Amenorrhoea in women (abnormal absence of menstruation) Reduced libido in both sexes Decreased sexual function Erectile dysfunction in men Hypogonadisms (decreased testicular function with shrinkage of testicles) Infertility Depression and fatigue Loss of muscle mass Anxiety Depression Immune suppression Hyperalgesia Weight gain Anemia Osteoporosis Patients (particularly women of childbearing age) should avoid opioids. There is insufficient evidence to recommend routine monitoring of asymptomatic patients taking opioids in the long-term for hormonal deficiencies.  Immune System: Human studies have demonstrated that opioids have an immunomodulating effect. These effects are mediated via opioid receptors both on immune effector cells and in the central nervous system. Opioids have been demonstrated to have adverse effects on antimicrobial response and anti-tumour surveillance. Buprenorphine has  been demonstrated to have no impact on immune function.  Opioid Induced Hyperalgesia: Human studies have demonstrated that prolonged use of opioids can lead to a state of abnormal pain sensitivity, sometimes called opioid induced hyperalgesia (OIH). Opioid induced hyperalgesia is not usually seen in the absence of tolerance to opioid analgesia. Clinically, hyperalgesia may be diagnosed if the patient on long-term opioid therapy presents with increased pain. This might be qualitatively and anatomically distinct from pain related to disease progression or to breakthrough pain resulting from development of opioid tolerance. Pain associated with hyperalgesia tends to be more diffuse than the pre-existing pain and less defined in quality. Management of opioid induced hyperalgesia requires opioid dose reduction.  Cancer: Chronic opioid therapy has been associated with an increased risk of cancer  among noncancer patients with chronic pain. This association was more evident in chronic strong opioid users. Chronic opioid consumption causes significant pathological changes in the small intestine and colon. Epidemiological studies have found that there is a link between opium dependence and initiation of gastrointestinal cancers. Cancer is the second leading cause of death after cardiovascular disease. Chronic use of opioids can cause multiple conditions such as GERD, immunosuppression and renal damage as well as carcinogenic effects, which are associated with the incidence of cancers.   Mortality: Long-term opioid use has been associated with increased mortality among patients with chronic non-cancer pain (CNCP).  Prescription of long-acting opioids for chronic noncancer pain was associated with a significantly increased risk of all-cause mortality, including deaths from causes other than overdose.  Reference: Von Korff M, Kolodny A, Deyo RA, Chou R. Long-term opioid therapy reconsidered. Ann Intern Med. 2011  Sep 6;155(5):325-8. doi: 10.7326/[id number]. PMID: 64403474; PMCID: [id number]. Randon Goldsmith, Hayward RA, Dunn KM, [place] KP. Risk of adverse events in patients prescribed long-term opioids: A cohort study in the [place] [facility] Research Datalink. Eur J Pain. 2019 May;23(5):908-922. doi: 10.1002/ejp.1357. Epub 2019 Jan 31. PMID: 43329518. Colameco S, Coren JS, Ciervo CA. Continuous opioid treatment for chronic noncancer pain: a time for moderation in prescribing. Postgrad Med. 2009 Jul;121(4):61-6. doi: 10.3810/pgm.2009.07.2032. PMID: 84166063. William Hamburger RN, [place] SD, Blazina I, Cristopher Peru, Bougatsos C, Deyo RA. The effectiveness and risks of long-term opioid therapy for chronic pain: a systematic review for a [employer] of [employer] Pathways to [employer]. Ann Intern Med. 2015 Feb 17;162(4):276-86. doi: 10.7326/M14-2559. PMID: 01601093. Caryl Bis [facility], Makuc DM. NCHS Data Brief No. 22. [place]: Centers for Disease Control and Prevention; 2009. Sep, Increase in Fatal Poisonings Involving Opioid Analgesics in the [place], 1999-2006. Song IA, Choi HR, Oh TK. Long-term opioid use and mortality in patients with chronic non-cancer pain: Ten-year follow-up study in [place] from 2010 through 2019. EClinicalMedicine. 2022 Jul 18;51:101558. doi: 10.1016/j.eclinm.2022.235573. PMID: 22025427; PMCID: [id number]. Huser, W., Schubert, T., Vogelmann, T. et al. All-cause mortality in patients with long-term opioid therapy compared with non-opioid analgesics for chronic non-cancer pain: a database study. BMC Med 18, 162 (2020). http://lester.info/ Rashidian H, Karie Kirks, Malekzadeh R, Haghdoost AA. An Ecological Study of the Association between Opiate Use and Incidence of Cancers. Addict Health. 2016 Fall;8(4):252-260. PMID: 51761607; PMCID: [id number].  Our Goal: Our goal is to control your  pain with means other than the use of opioid pain medications.  Our Recommendation: Talk to your physician about coming off of these medications. We can assist you with the tapering down and stopping these medicines. Based on the new information, even if you cannot completely stop the medication, a decrease in the dose may be associated with a lesser risk. Ask for other means of controlling the pain. Decrease or eliminate those factors that significantly contribute to your pain such as smoking, obesity, and a diet heavily tilted towards "inflammatory" nutrients.  Last Updated: 07/29/2022   ____________________________________________________________________________________________     ____________________________________________________________________________________________  National Pain Medication Shortage  The [place] is experiencing worsening drug shortages. These have had a negative widespread effect on patient care and treatment. Not expected to improve any time soon. Predicted to last past 2029.   Drug shortage list (generic names) Oxycodone IR Oxycodone/APAP Oxymorphone IR Hydromorphone Hydrocodone/APAP Morphine  Where is the problem?  Manufacturing and supply level.  Will this shortage affect you?  Only if you  take any of the above pain medications.  How? You may be unable to fill your prescription.  Your pharmacist may offer a "partial fill" of your prescription. (Warning: Do not accept partial fills.) Prescriptions partially filled cannot be transferred to another pharmacy. Read our Medication Rules and Regulation. Depending on how much medicine you are dependent on, you may experience withdrawals when unable to get the medication.  Recommendations: Consider ending your dependence on opioid pain medications. Ask your pain specialist to assist you with the process. Consider switching to a medication currently not in shortage, such as Buprenorphine. Talk to your pain  specialist about this option. Consider decreasing your pain medication requirements by managing tolerance thru "Drug Holidays". This may help minimize withdrawals, should you run out of medicine. Control your pain thru the use of non-pharmacological interventional therapies.   Your prescriber: Prescribers cannot be blamed for shortages. Medication manufacturing and supply issues cannot be fixed by the prescriber.   NOTE: The prescriber is not responsible for supplying the medication, or solving supply issues. Work with your pharmacist to solve it. The patient is responsible for the decision to take or continue taking the medication and for identifying and securing a legal supply source. By law, supplying the medication is the job and responsibility of the pharmacy. The prescriber is responsible for the evaluation, monitoring, and prescribing of these medications.   Prescribers will NOT: Re-issue prescriptions that have been partially filled. Re-issue prescriptions already sent to a pharmacy.  Re-send prescriptions to a different pharmacy because yours did not have your medication. Ask pharmacist to order more medicine or transfer the prescription to another pharmacy. (Read below.)  New 2023 regulation: "September 21, 2021 Revised Regulation Allows DEA-Registered Pharmacies to Transfer Electronic Prescriptions at a Patient's Request DEA Headquarters Division - Public Information Office Patients now have the ability to request their electronic prescription be transferred to another pharmacy without having to go back to their practitioner to initiate the request. This revised regulation went into effect on Monday, September 17, 2021.     At a patient's request, a DEA-registered retail pharmacy can now transfer an electronic prescription for a controlled substance (schedules II-V) to another DEA-registered retail pharmacy. Prior to this change, patients would have to go through their practitioner to  cancel their prescription and have it re-issued to a different pharmacy. The process was taxing and time consuming for both patients and practitioners.    The Drug Enforcement Administration [facility]) published its intent to revise the process for transferring electronic prescriptions on December 10, 2019.  The final rule was published in the federal register on August 16, 2021 and went into effect 30 days later.  Under the final rule, a prescription can only be transferred once between pharmacies, and only if allowed under existing state or other applicable law. The prescription must remain in its electronic form; may not be altered in any way; and the transfer must be communicated directly between two licensed pharmacists. It's important to note, any authorized refills transfer with the original prescription, which means the entire prescription will be filled at the same pharmacy".  Reference: HugeHand.is [facility] website announcement)  [email or web address] [employer] of Justice)   [employer] / Vol. 88, No. 143 / Thursday, August 16, 2021 / Rules and Regulations DEPARTMENT OF JUSTICE  Drug Enforcement Administration  21 CFR Part 1306  [Docket No. [id number]]  RIN [id number] Transfer of Electronic Prescriptions for Schedules II-V Controlled Substances Between Pharmacies for Initial Filling  ____________________________________________________________________________________________  ____________________________________________________________________________________________  Transfer of Pain Medication between Pharmacies  Re: 2023 DEA Clarification on existing regulation  Published on DEA Website: September 21, 2021  Title: Revised Regulation Allows DEA-Registered Pharmacies to Electrical engineer Prescriptions at a Patient's  Request DEA Headquarters Division - [employer]  "Patients now have the ability to request their electronic prescription be transferred to another pharmacy without having to go back to their practitioner to initiate the request. This revised regulation went into effect on Monday, September 17, 2021.     At a patient's request, a DEA-registered retail pharmacy can now transfer an electronic prescription for a controlled substance (schedules II-V) to another DEA-registered retail pharmacy. Prior to this change, patients would have to go through their practitioner to cancel their prescription and have it re-issued to a different pharmacy. The process was taxing and time consuming for both patients and practitioners.    The Drug Enforcement Administration [facility]) published its intent to revise the process for transferring electronic prescriptions on December 10, 2019.  The final rule was published in the federal register on August 16, 2021 and went into effect 30 days later.  Under the final rule, a prescription can only be transferred once between pharmacies, and only if allowed under existing state or other applicable law. The prescription must remain in its electronic form; may not be altered in any way; and the transfer must be communicated directly between two licensed pharmacists. It's important to note, any authorized refills transfer with the original prescription, which means the entire prescription will be filled at the same pharmacy."    REFERENCES: 1. DEA website announcement [email or web address]  2. Department of Justice website  [email or web address].pdf  3. DEPARTMENT OF JUSTICE Drug Enforcement Administration 21 CFR Part 1306 [Docket No. [id number]] RIN [id number] "Transfer of Electronic Prescriptions for Schedules II-V Controlled Substances  Between Pharmacies for Initial Filling"  ____________________________________________________________________________________________     _______________________________________________________________________  Medication Rules  Purpose: To inform patients, and their family members, of our medication rules and regulations.  Applies to: All patients receiving prescriptions from our practice (written or electronic).  Pharmacy of record: This is the pharmacy where your electronic prescriptions will be sent. Make sure we have the correct one.  Electronic prescriptions: In compliance with the [facility] Strengthen Opioid Misuse Prevention (STOP) Act of 2017 (Session Conni Elliot [phone]), effective January 21, 2018, all controlled substances must be electronically prescribed. Written prescriptions, faxing, or calling prescriptions to a pharmacy will no longer be done.  Prescription refills: These will be provided only during in-person appointments. No medications will be renewed without a "face-to-face" evaluation with your provider. Applies to all prescriptions.  NOTE: The following applies primarily to controlled substances (Opioid* Pain Medications).   Type of encounter (visit): For patients receiving controlled substances, face-to-face visits are required. (Not an option and not up to the patient.)  Patient's responsibilities: Pain Pills: Bring all pain pills to every appointment (except for procedure appointments). Pill Bottles: Bring pills in original pharmacy bottle. Bring bottle, even if empty. Always bring the bottle of the most recent fill.  Medication refills: You are responsible for knowing and keeping track of what medications you are taking and when is it that you will need a refill. The day before your appointment: write a list of all prescriptions that need to be refilled. The day of the appointment: give the list to the admitting nurse. Prescriptions will be written only  during appointments. No prescriptions will be written on procedure days. If you forget a  medication: it will not be "Called in", "Faxed", or "electronically sent". You will need to get another appointment to get these prescribed. No early refills. Do not call asking to have your prescription filled early. Partial  or short prescriptions: Occasionally your pharmacy may not have enough pills to fill your prescription.  NEVER ACCEPT a partial fill or a prescription that is short of the total amount of pills that you were prescribed.  With controlled substances the law allows 72 hours for the pharmacy to complete the prescription.  If the prescription is not completed within 72 hours, the pharmacist will require a new prescription to be written. This means that you will be short on your medicine and we WILL NOT send another prescription to complete your original prescription.  Instead, request the pharmacy to send a carrier to a nearby branch to get enough medication to provide you with your full prescription. Prescription Accuracy: You are responsible for carefully inspecting your prescriptions before leaving our office. Have the discharge nurse carefully go over each prescription with you, before taking them home. Make sure that your name is accurately spelled, that your address is correct. Check the name and dose of your medication to make sure it is accurate. Check the number of pills, and the written instructions to make sure they are clear and accurate. Make sure that you are given enough medication to last until your next medication refill appointment. Taking Medication: Take medication as prescribed. When it comes to controlled substances, taking less pills or less frequently than prescribed is permitted and encouraged. Never take more pills than instructed. Never take the medication more frequently than prescribed.  Inform other Doctors: Always inform, all of your healthcare providers, of all the  medications you take. Pain Medication from other Providers: You are not allowed to accept any additional pain medication from any other Doctor or Healthcare provider. There are two exceptions to this rule. (see below) In the event that you require additional pain medication, you are responsible for notifying us, as stated below. Cough Medicine: Often these contain an opioid, such as codeine or hydrocodone. Never accept or take cough medicine containing these opioids if you are already taking an opioid* medication. The combination may cause respiratory failure and death. Medication Agreement: You are responsible for carefully reading and following our Medication Agreement. This must be signed before receiving any prescriptions from our practice. Safely store a copy of your signed Agreement. Violations to the Agreement will result in no further prescriptions. (Additional copies of our Medication Agreement are available upon request.) Laws, Rules, & Regulations: All patients are expected to follow all [address] and [employer], [employer], Rules, [place] [place]. Ignorance of the Laws does not constitute a valid excuse.  Illegal drugs and Controlled Substances: The use of illegal substances (including, but not limited to marijuana and its derivatives) and/or the illegal use of any controlled substances is strictly prohibited. Violation of this rule may result in the immediate and permanent discontinuation of any and all prescriptions being written by our practice. The use of any illegal substances is prohibited. Adopted CDC guidelines & recommendations: Target dosing levels will be at or below 60 MME/day. Use of benzodiazepines** is not recommended.  Exceptions: There are only two exceptions to the rule of not receiving pain medications from other Healthcare Providers. Exception #1 (Emergencies): In the event of an emergency (i.e.: accident requiring emergency care), you are allowed to receive additional pain  medication. However, you are responsible for: As soon as  you are able, call our office [phone], at any time of the day or night, and leave a message stating your name, the date and nature of the emergency, and the name and dose of the medication prescribed. In the event that your call is answered by a member of our staff, make sure to document and save the date, time, and the name of the person that took your information.  Exception #2 (Planned Surgery): In the event that you are scheduled by another doctor or dentist to have any type of surgery or procedure, you are allowed (for a period no longer than 30 days), to receive additional pain medication, for the acute post-op pain. However, in this case, you are responsible for picking up a copy of our "Post-op Pain Management for Surgeons" handout, and giving it to your surgeon or dentist. This document is available at our office, and does not require an appointment to obtain it. Simply go to our office during business hours (Monday-Thursday from 8:00 AM to 4:00 PM) (Friday 8:00 AM to 12:00 Noon) or if you have a scheduled appointment with [place], prior to your surgery, and ask for it by name. In addition, you are responsible for: calling our office [phone], at any time of the day or night, and leaving a message stating your name, name of your surgeon, type of surgery, and date of procedure or surgery. Failure to comply with your responsibilities may result in termination of therapy involving the controlled substances. Medication Agreement Violation. Following the above rules, including your responsibilities will help you in avoiding a Medication Agreement Violation ("Breaking your Pain Medication Contract").  Consequences:  Not following the above rules may result in permanent discontinuation of medication prescription therapy.  *Opioid medications include: morphine, codeine, oxycodone, oxymorphone, hydrocodone, hydromorphone, meperidine, tramadol,  tapentadol, buprenorphine, fentanyl, methadone. **Benzodiazepine medications include: diazepam (Valium), alprazolam (Xanax), clonazepam (Klonopine), lorazepam (Ativan), clorazepate (Tranxene), chlordiazepoxide (Librium), estazolam (Prosom), oxazepam (Serax), temazepam (Restoril), triazolam (Halcion) (Last updated: 11/13/2021) ______________________________________________________________________    ______________________________________________________________________  Medication Recommendations and Reminders  Applies to: All patients receiving prescriptions (written and/or electronic).  Medication Rules & Regulations: You are responsible for reading, knowing, and following our "Medication Rules" document. These exist for your safety and that of others. They are not flexible and neither are we. Dismissing or ignoring them is an act of "non-compliance" that may result in complete and irreversible termination of such medication therapy. For safety reasons, "non-compliance" will not be tolerated. As with the [place] fundamental legal principle of "ignorance of the law is no defense", we will accept no excuses for not having read and knowing the content of documents provided to you by our practice.  Pharmacy of record:  Definition: This is the pharmacy where your electronic prescriptions will be sent.  We do not endorse any particular pharmacy. It is up to you and your insurance to decide what pharmacy to use.  We do not restrict you in your choice of pharmacy. However, once we write for your prescriptions, we will NOT be re-sending more prescriptions to fix restricted supply problems created by your pharmacy, or your insurance.  The pharmacy listed in the electronic medical record should be the one where you want electronic prescriptions to be sent. If you choose to change pharmacy, simply notify our nursing staff. Changes will be made only during your regular appointments and not over the  phone.  Recommendations: Keep all of your pain medications in a safe place, under lock and key, even  if you live alone. We will NOT replace lost, stolen, or damaged medication. We do not accept "Police Reports" as proof of medications having been stolen. After you fill your prescription, take 1 week's worth of pills and put them away in a safe place. You should keep a separate, properly labeled bottle for this purpose. The remainder should be kept in the original bottle. Use this as your primary supply, until it runs out. Once it's gone, then you know that you have 1 week's worth of medicine, and it is time to come in for a prescription refill. If you do this correctly, it is unlikely that you will ever run out of medicine. To make sure that the above recommendation works, it is very important that you make sure your medication refill appointments are scheduled at least 1 week before you run out of medicine. To do this in an effective manner, make sure that you do not leave the office without scheduling your next medication management appointment. Always ask the nursing staff to show you in your prescription , when your medication will be running out. Then arrange for the receptionist to get you a return appointment, at least 7 days before you run out of medicine. Do not wait until you have 1 or 2 pills left, to come in. This is very poor planning and does not take into consideration that we may need to cancel appointments due to bad weather, sickness, or emergencies affecting our staff. DO NOT ACCEPT A "Partial Fill": If for any reason your pharmacy does not have enough pills/tablets to completely fill or refill your prescription, do not allow for a "partial fill". The law allows the pharmacy to complete that prescription within 72 hours, without requiring a new prescription. If they do not fill the rest of your prescription within those 72 hours, you will need a separate prescription to fill the remaining  amount, which we will NOT provide. If the reason for the partial fill is your insurance, you will need to talk to the pharmacist about payment alternatives for the remaining tablets, but again, DO NOT ACCEPT A PARTIAL FILL, unless you can trust your pharmacist to obtain the remainder of the pills within 72 hours.  Prescription refills and/or changes in medication(s):  Prescription refills, and/or changes in dose or medication, will be conducted only during scheduled medication management appointments. (Applies to both, written and electronic prescriptions.) No refills on procedure days. No medication will be changed or started on procedure days. No changes, adjustments, and/or refills will be conducted on a procedure day. Doing so will interfere with the diagnostic portion of the procedure. No phone refills. No medications will be "called into the pharmacy". No Fax refills. No weekend refills. No Holliday refills. No after hours refills.  Remember:  Business hours are:  Monday to Thursday 8:00 AM to 4:00 PM Provider's Schedule: Delano Metz, MD - Appointments are:  Medication management: Monday and Wednesday 8:00 AM to 4:00 PM Procedure day: Tuesday and Thursday 7:30 AM to 4:00 PM Edward Jolly, MD - Appointments are:  Medication management: Tuesday and Thursday 8:00 AM to 4:00 PM Procedure day: Monday and Wednesday 7:30 AM to 4:00 PM (Last update: 11/13/2021) ______________________________________________________________________   ____________________________________________________________________________________________  Naloxone Nasal Spray  Why am I receiving this medication? [place] STOP ACT requires that all patients taking high dose opioids or at risk of opioids respiratory depression, be prescribed an opioid reversal agent, such as Naloxone (AKA: Narcan).  What is this medication? NALOXONE (  nal OX one) treats opioid overdose, which causes slow or shallow breathing,  severe drowsiness, or trouble staying awake. Call emergency services after using this medication. You may need additional treatment. Naloxone works by reversing the effects of opioids. It belongs to a group of medications called opioid blockers.  COMMON BRAND NAME(S): Kloxxado, Narcan  What should I tell my care team before I take this medication? They need to know if you have any of these conditions: Heart disease Substance use disorder An unusual or allergic reaction to naloxone, other medications, foods, dyes, or preservatives Pregnant or trying to get pregnant Breast-feeding  When to use this medication? This medication is to be used for the treatment of respiratory depression (less than 8 breaths per minute) secondary to opioid overdose.   How to use this medication? This medication is for use in the nose. Lay the person on their back. Support their neck with your hand and allow the head to tilt back before giving the medication. The nasal spray should be given into 1 nostril. After giving the medication, move the person onto their side. Do not remove or test the nasal spray until ready to use. Get emergency medical help right away after giving the first dose of this medication, even if the person wakes up. You should be familiar with how to recognize the signs and symptoms of a narcotic overdose. If more doses are needed, give the additional dose in the other nostril. Talk to your care team about the use of this medication in children. While this medication may be prescribed for children as young as newborns for selected conditions, precautions do apply.  Naloxone Overdosage: If you think you have taken too much of this medicine contact a poison control center or emergency room at once.  NOTE: This medicine is only for you. Do not share this medicine with others.  What if I miss a dose? This does not apply.  What may interact with this medication? This is only used during an  emergency. No interactions are expected during emergency use. This list may not describe all possible interactions. Give your health care provider a list of all the medicines, herbs, non-prescription drugs, or dietary supplements you use. Also tell them if you smoke, drink alcohol, or use illegal drugs. Some items may interact with your medicine.  What should I watch for while using this medication? Keep this medication ready for use in the case of an opioid overdose. Make sure that you have the phone number of your care team and local hospital ready. You may need to have additional doses of this medication. Each nasal spray contains a single dose. Some emergencies may require additional doses. After use, bring the treated person to the nearest hospital or call 911. Make sure the treating care team knows that the person has received a dose of this medication. You will receive additional instructions on what to do during and after use of this medication before an emergency occurs.  What side effects may I notice from receiving this medication? Side effects that you should report to your care team as soon as possible: Allergic reactions--skin rash, itching, hives, swelling of the face, lips, tongue, or throat Side effects that usually do not require medical attention (report these to your care team if they continue or are bothersome): Constipation Dryness or irritation inside the nose Headache Increase in blood pressure Muscle spasms Stuffy nose Toothache This list may not describe all possible side effects. Call your doctor for  medical advice about side effects. You may report side effects to FDA at 1-800-FDA-1088.  Where should I keep my medication? Because this is an emergency medication, you should keep it with you at all times.  Keep out of the reach of children and pets. Store between 20 and 25 degrees C (68 and 77 degrees F). Do not freeze. Throw away any unused medication after the  expiration date. Keep in original box until ready to use.  NOTE: This sheet is a summary. It may not cover all possible information. If you have questions about this medicine, talk to your doctor, pharmacist, or health care provider.   2023 [employer]/Gold Standard (2020-09-15 00:00:00)  ____________________________________________________________________________________________

## 2022-08-29 NOTE — Progress Notes (Signed)
PROVIDER NOTE: Information contained herein reflects review and annotations entered in association with encounter. Interpretation of such information and data should be left to medically-trained personnel. Information provided to patient can be located elsewhere in the medical record under "Patient Instructions". Document created using STT-dictation technology, any transcriptional errors that may result from process are unintentional.    Patient: Tanya [name]  Service Category: E/M  Provider: Oswaldo Done, MD  DOB: January 12, 1961  DOS: 08/29/2022  Referring Provider: Mick Sell, MD  MRN: [id number]  Specialty: Interventional Pain Management  PCP: Mick Sell, MD  Type: Established Patient  Setting: Ambulatory outpatient    Location: Office  Delivery: Face-to-face     HPI  Tanya [name], a 62 y.o. year old female, is here today because of her Chronic neck pain [M54.2, G89.29]. Ms. Cicco primary complain today is Neck Pain  Pertinent problems: Ms. Helman has Chronic upper back pain (2ry area of Pain) (Bilateral) (R>L); Chronic low back pain (3ry area of Pain) (Bilateral) (R>L) w/o sciatica; Chronic neck pain (1ry area of Pain) (Bilateral) (R>L); Fibromyalgia; Osteoarthritis; Chronic pain syndrome; Chronic upper extremity pain (Right); Chronic cervical radicular pain (Right); Chronic shoulder pain (Bilateral) (R>L); Chronic musculoskeletal pain; Cervical spondylosis; Cervical facet syndrome (Bilateral) (R>L); Shoulder radicular pain (Bilateral) (R>L); Acute postoperative pain; Spondylosis without myelopathy or radiculopathy, cervical region; Cervicalgia; Occipital headache (Left); Neurogenic pain; Cervico-occipital neuralgia (Left); Chronic elbow pain (Left); Osteoarthritis of AC (acromioclavicular) joint (Right); Chronic acromioclavicular joint pain (Right); Chronic shoulder pain (Right); Epicondylitis elbow, medial (Left); Osteoarthritis of shoulders (Bilateral)  (R>L); Chronic shoulder pain (Left); Osteoarthritis of shoulder (Left); DDD (degenerative disc disease), cervical; Arthropathy of cervical facet joint (Bilateral); Cervical spine pain; Abnormal MRI, cervical spine (09/20/2015); Cervical central spinal stenosis (Multilevel); 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 Pain Assessment: Severity of Chronic pain is reported as a 8 /10. Location: Neck  /"numbness at a specific spot on the back right side of my head"; "right side of head is painful to touch"; neck pain radiates to suprascapular area bilaterally. Onset: More than a month ago. Quality: Burning, Sharp, Numbness, Aching. Timing: Constant. Modifying factor(s): meds, rest, topicals. Vitals:  height is 5\' 3"  (1.6 m) and weight is 155 lb (70.3 kg). Her temperature is 98.2 F (36.8 C). Her blood pressure is 148/87 (abnormal) and her pulse is 91. Her respiration is 16 and oxygen saturation is 97%.  BMI: Estimated body mass index is 27.46 kg/m as calculated from the following:   Height as of this encounter: 5\' 3"  (1.6 m).   Weight as of this encounter: 155 lb (70.3 kg). Last encounter: 07/29/2022. Last procedure: 07/18/2022.  Reason for encounter: both, medication management and post-procedure evaluation and assessment.  The patient indicates doing well with the current medication regimen. No adverse reactions or side effects reported to the medications.   RTCB:  11/27/2022   Post-procedure evaluation   Procedure: Cervical Facet, Medial Branch Radiofrequency Ablation (RFA)  #4  Laterality: Right (-RT)  Level: C3, C4, C5, C6, and C7 Medial Branch Level(s). Lesioning of these levels should completely denervate the C3-4, C4-5, C5-6, and C6-7 cervical facet joints.  Imaging: Fluoroscopy-guided Spinal  ([id number]) Anesthesia: Local anesthesia (1-2% Lidocaine) Anxiolysis: IV Versed 2.0 mg Sedation: Moderate Sedation Fentanyl 1 mL (50 mcg) DOS: 07/18/2022  Performed by: Oswaldo Done, MD  Purpose: Therapeutic/Palliative  Indications: Chronic neck pain (cervicalgia) severe enough to impact quality of life or function. Rationale (medical necessity): procedure needed and proper for the diagnosis and/or treatment of Ms. Feick's medical symptoms and needs. Indications: 1. Cervical facet syndrome (Bilateral) (R>L)   2. Cervical spine pain   3. Arthropathy of cervical facet joint (Bilateral)   4. Chronic neck pain (1ry area of Pain) (Bilateral) (R>L)   5. Cervicalgia   6. Cervical facet joint pain   7. Spondylosis without myelopathy or radiculopathy, cervical region    Chronic anticoagulation (Plaquenil)    Ms. Edelman has been dealing with the above chronic pain for longer than three months and has either failed to respond, was unable to tolerate, or simply did not get enough benefit from other more conservative therapies including, but not limited to: 1. Over-the-counter medications 2. Anti-inflammatory medications 3. Muscle relaxants 4. Membrane stabilizers 5. Opioids 6. Physical therapy and/or chiropractic manipulation 7. Modalities (Heat, ice, etc.) 8. Invasive techniques such as nerve blocks. Ms. Juhas has attained more than 50% relief of the pain from a series of diagnostic injections conducted in separate occasions.  Pain Score: Pre-procedure: 5 /10 Post-procedure: 0-No pain/10  Note: Moved during RFA of lower MBs. Possible disengagement of target.     Effectiveness:  Initial hour after procedure: 0 %. Subsequent 4-6 hours post-procedure: 0 %. Analgesia past initial 6 hours: 0 % (Pt states new occipital pain since day of procedure). Ongoing improvement:  Analgesic: Unfortunately, the patient indicated not having attained any significant relief.  However,  he could also be that she has a neuritis as a consequence of the RFA.  Today I have provided her with a steroid taper to see if this can help bring this pain back under control. Function: Minimal improvement ROM: Minimal improvement  Pharmacotherapy Assessment  Analgesic: None MME/day: 0 mg/day.   Monitoring: [place] PMP: PDMP reviewed during this encounter.       Pharmacotherapy: No side-effects or adverse reactions reported. Compliance: No problems identified. Effectiveness: Clinically acceptable.  Nonah Mattes, RN  08/29/2022  1:59 PM  Sign when Signing Visit Nursing Pain Medication Assessment:  Safety precautions to be maintained throughout the outpatient stay will include: orient to surroundings, keep bed in low position, maintain call bell within reach at all times, provide assistance with transfer out of bed and ambulation.  Medication Inspection Compliance: Pill count conducted under aseptic conditions, in front of the patient. Neither the pills nor the bottle was removed from the patient's sight at any time. Once count was completed pills were immediately returned to the patient in their original bottle.  Medication #1: Oxycodone IR Pill/Patch Count:  0 of 60 pills remain Pill/Patch Appearance: Markings consistent with prescribed medication Bottle Appearance: Standard pharmacy container. Clearly labeled. Filled Date: 6 / 8 / 2024 Last Medication intake:  Ran out of medicine more than 48 hours ago  Medication #2: Hydrocodone/APAP Pill/Patch Count:  0 of 15 pills remain Pill/Patch Appearance: Markings consistent with prescribed medication Bottle Appearance: Standard pharmacy container. Clearly labeled. Filled Date: 76 / 27 / 2024 Last Medication intake:  Ran out of medicine more than 48 hours ago  Safety precautions to be maintained throughout the outpatient stay will include: orient to surroundings, keep bed in low position, maintain call bell within reach at all times, provide  assistance with transfer out of bed and ambulation.     No results found for: "CBDTHCR" No results found for: "D8THCCBX" No results found for: "D9THCCBX"  UDS:  Summary  Date Value Ref Range Status  05/01/2022 Note  Final    Comment:    ==================================================================== [employer] Select 13 (MW) ==================================================================== Specimen Alert Note: Urinary creatinine is low; ability to detect some drugs may be compromised. Interpret results with caution. (Creatinine) ==================================================================== Test                             Result       Flag       Units  Drug Absent but Declared for Prescription Verification   Oxycodone                      Not Detected UNEXPECTED ng/mg creat ==================================================================== Test                      Result    Flag   Units      Ref Range   Creatinine              11        LL     mg/dL      >=16 ==================================================================== Declared Medications:  The flagging and interpretation on this report are based on the  following declared medications.  Unexpected results may arise from  inaccuracies in the declared medications.   **Note: The testing scope of this panel includes these medications:   Oxycodone (Roxicodone)   **Note: The testing scope of this panel does not include the  following reported medications:   Albuterol (Ventolin HFA)  Atorvastatin (Lipitor)  Clobetasol (Temovate)  Duloxetine (Cymbalta)  Folic Acid  Gabapentin (Neurontin)  Hydroxychloroquine (Plaquenil)  Hydroxyzine (Vistaril)  Methotrexate  Naloxone (Narcan)  Nicotine ==================================================================== For clinical consultation, please call [phone]. ====================================================================       ROS   Constitutional: Denies any fever or chills Gastrointestinal: No reported hemesis, hematochezia, vomiting, or acute GI distress Musculoskeletal: Denies any acute onset joint swelling, redness, loss of ROM, or weakness Neurological: No reported episodes of acute onset apraxia, aphasia, dysarthria, agnosia, amnesia, paralysis, loss of coordination, or loss of consciousness  Medication Review  DULoxetine, albuterol, atorvastatin, baclofen, clobetasol cream, gabapentin, hydrOXYzine, hydroxychloroquine, mycophenolate, naloxone, nicotine, oxyCODONE, and predniSONE  History Review  Allergy: Ms. Ridley is allergic to pregabalin, trazodone, bupropion, methocarbamol, and tramadol. Drug: Ms. Drier  reports no history of drug use. Alcohol:  reports no history of alcohol use. Tobacco:  reports that she has been smoking cigarettes. She has a 35 pack-year smoking history. She has been exposed to tobacco smoke. She has never used smokeless tobacco. Social: Ms. Knepp  reports that she has been smoking cigarettes. She has a 35 pack-year smoking history. She has been exposed to tobacco smoke. She has never used smokeless tobacco. She reports that she does not drink alcohol and does not use drugs. Medical:  has a past medical history of Anxiety, Bladder infection, Chronic lower back pain, Chronic neck pain, Collagen vascular disease (HCC), COVID-19, Depression, Diverticulitis, GERD (gastroesophageal reflux disease), Hyperlipidemia, Lupus (HCC), and Overactive bladder. Surgical: Ms. Delgardo  has a past surgical history that includes Abdominal hysterectomy; Colonoscopy; Colonoscopy with propofol (N/A, 07/03/2017); Tonsillectomy; Esophagogastroduodenoscopy (N/A, 10/21/2017); and Colonoscopy with propofol (N/A, 10/21/2017). Family: family history includes Alcohol abuse in her mother; Cancer in her mother; Depression in her mother; Diabetes in her brother; Drug abuse in her son; Emphysema in her mother;  Glaucoma in her father; Heart disease in her father; Hypertension in her mother; Stroke in her  mother.  Laboratory Chemistry Profile   Renal Lab Results  Component Value Date   BUN 7 06/09/2019   CREATININE 0.72 06/09/2019   BCR 15 09/03/2017   GFRAA >60 06/09/2019   GFRNONAA >60 06/09/2019    Hepatic Lab Results  Component Value Date   AST 26 06/09/2019   ALT 20 06/09/2019   ALBUMIN 4.1 06/09/2019   ALKPHOS 29 (L) 06/09/2019   LIPASE 35 06/09/2019    Electrolytes Lab Results  Component Value Date   NA 135 06/09/2019   K 4.2 06/09/2019   CL 102 06/09/2019   CALCIUM 8.4 (L) 06/09/2019   MG 1.9 09/03/2017    Bone Lab Results  Component Value Date   25OHVITD1 25 (L) 09/03/2017   25OHVITD2 2.9 09/03/2017   25OHVITD3 22 09/03/2017    Inflammation (CRP: Acute Phase) (ESR: Chronic Phase) Lab Results  Component Value Date   CRP 2 09/03/2017   ESRSEDRATE 21 09/03/2017         Note: Above Lab results reviewed.  Recent Imaging Review  DG PAIN CLINIC C-ARM 1-60 MIN NO REPORT Fluoro was used, but no Radiologist interpretation will be provided.  Please refer to "NOTES" tab for provider progress note. Note: Reviewed        Physical Exam  General appearance: Well nourished, well developed, and well hydrated. In no apparent acute distress Mental status: Alert, oriented x 3 (person, place, & time)       Respiratory: No evidence of acute respiratory distress Eyes: PERLA Vitals: BP (!) 148/87   Pulse 91   Temp 98.2 F (36.8 C)   Resp 16   Ht 5\' 3"  (1.6 m)   Wt 155 lb (70.3 kg)   SpO2 97%   BMI 27.46 kg/m  BMI: Estimated body mass index is 27.46 kg/m as calculated from the following:   Height as of this encounter: 5\' 3"  (1.6 m).   Weight as of this encounter: 155 lb (70.3 kg). Ideal: Ideal body weight: 52.4 kg (115 lb 8.3 oz) Adjusted ideal body weight: 59.6 kg (131 lb 5 oz)  Assessment   Diagnosis Status  1. Chronic neck pain (1ry area of Pain) (Bilateral)  (R>L)   2. Cervical spine pain   3. Cervical facet joint pain   4. Cervical facet syndrome (Bilateral) (R>L)   5. Postop check   6. Cervicalgia   7. Chronic pain syndrome   8. Pharmacologic therapy   9. Chronic upper back pain (2ry area of Pain) (Bilateral) (R>L)   10. Chronic musculoskeletal pain   11. Chronic shoulder pain (Bilateral) (R>L)   12. Encounter for medication management   13. Encounter for chronic pain management   14. Chronic use of opiate for therapeutic purpose   15. Chronic low back pain (3ry area of Pain) (Bilateral) (R>L) w/o sciatica   16. Acute postoperative pain    Controlled Controlled Controlled   Updated Problems: No problems updated.  Plan of Care  Problem-specific:  No problem-specific Assessment & Plan notes found for this encounter.  Ms. KYNIAH LUSH has a current medication list which includes the following long-term medication(s): duloxetine, gabapentin, ventolin hfa, oxycodone, [START ON 09/28/2022] oxycodone, and [START ON 10/28/2022] oxycodone.  Pharmacotherapy (Medications Ordered): Meds ordered this encounter  Medications   oxyCODONE (OXY IR/ROXICODONE) 5 MG immediate release tablet    Sig: Take 1 tablet (5 mg total) by mouth 2 (two) times daily as needed for severe pain. Must last 30 days    Dispense:  60 tablet    Refill:  0    DO NOT: delete (not duplicate); no partial-fill (will deny script to complete), no refill request (F/U required). DISPENSE: 1 day early if closed on fill date. WARN: No CNS-depressants within 8 hrs of med.   oxyCODONE (OXY IR/ROXICODONE) 5 MG immediate release tablet    Sig: Take 1 tablet (5 mg total) by mouth 2 (two) times daily as needed for severe pain. Must last 30 days    Dispense:  60 tablet    Refill:  0    DO NOT: delete (not duplicate); no partial-fill (will deny script to complete), no refill request (F/U required). DISPENSE: 1 day early if closed on fill date. WARN: No CNS-depressants within 8 hrs of  med.   oxyCODONE (OXY IR/ROXICODONE) 5 MG immediate release tablet    Sig: Take 1 tablet (5 mg total) by mouth 2 (two) times daily as needed for severe pain. Must last 30 days    Dispense:  60 tablet    Refill:  0    DO NOT: delete (not duplicate); no partial-fill (will deny script to complete), no refill request (F/U required). DISPENSE: 1 day early if closed on fill date. WARN: No CNS-depressants within 8 hrs of med.   predniSONE (DELTASONE) 20 MG tablet    Sig: Take 3 tablets (60 mg total) by mouth daily with breakfast for 3 days, THEN 2 tablets (40 mg total) daily with breakfast for 3 days, THEN 1 tablet (20 mg total) daily with breakfast for 3 days.    Dispense:  18 tablet    Refill:  0   Orders:  Orders Placed This Encounter  Procedures   Nursing Instructions:    Please complete this patient's postprocedure evaluation.    Scheduling Instructions:     Please complete this patient's postprocedure evaluation.   Follow-up plan:   Return in about 3 months (around 11/27/2022) for Eval-day (M,W), (F2F), (MM).      Interventional Therapies  Risk Factors  Considerations:   Antiplatelet therapy: Plaquenil (Stop: 11 days  Restart: next day)  Risk of prolonged QT Syndrome  Tobacco abuse  Anxiety  Depression \ Insomnia  SLE  Urinary incontinence     Planned  Pending:   Therapeutic left cervical facet RFA #4    Under consideration:   Therapeutic bilateral cervical facet MB RFA #4 (starting with the left side)   Completed:   Therapeutic left IA shoulder joint inj. x1 (12/30/2019) (100/100/100 x 5 days/0)  Palliative right IA shoulder joint inj. x2 (10/06/2018) (100/100/50/50)  Palliative right AC joint inj. x3 (04/02/2018) (N/A)  Palliative left medial epicondyle elbow inj. x1 (04/02/2018) (N/A)  Diagnostic left GONB x1 (12/25/2017) (N/A)  Palliative right CESI x2 (02/12/2016) (N/A) Therapeutic left C7-T1 cervical ESI x1 (03/18/2016) (100/50/0/0)  Palliative bilateral  cervical facet MBB x2 (06/26/2016)  Palliative right cervical facet RFA x3 (04/13/2020) (100/100/75/75)  Palliative left cervical facet RFA x3 (03/14/2020) (100/100/75/75)  Diagnostic/therapeutic midline C7-T1 TPI/MNB x1 (10/08/2021) (100/100/100/100)    Therapeutic  Palliative (PRN) options:   Palliative cervical facet MBB  Palliative cervical facet RFA     Pharmacotherapy  Nonopioids transfer 12/22/2019: Baclofen and Neurontin           Recent Visits Date Type Provider Dept  08/29/22 Office Visit Delano Metz, MD [facility]  07/18/22 Procedure visit Delano Metz, MD [facility]  07/02/22 Office Visit Delano Metz, MD [facility]  Showing recent visits within past 90 days  and meeting all other requirements Future Appointments Date Type Provider Dept  11/20/22 Appointment Delano Metz, MD [facility]  Showing future appointments within next 90 days and meeting all other requirements  I discussed the assessment and treatment plan with the patient. The patient was provided an opportunity to ask questions and all were answered. The patient agreed with the plan and demonstrated an understanding of the instructions.  Patient advised to call back or seek an in-person evaluation if the symptoms or condition worsens.  Duration of encounter: 30 minutes.  Total time on encounter, as per AMA guidelines included both the face-to-face and non-face-to-face time personally spent by the physician and/or other qualified health care professional(s) on the day of the encounter (includes time in activities that require the physician or other qualified health care professional and does not include time in activities normally performed by clinical staff). Physician's time may include the following activities when performed: Preparing to see the patient (e.g., pre-charting review of records, searching for previously ordered imaging, lab  work, and nerve conduction tests) Review of prior analgesic pharmacotherapies. Reviewing PMP Interpreting ordered tests (e.g., lab work, imaging, nerve conduction tests) Performing post-procedure evaluations, including interpretation of diagnostic procedures Obtaining and/or reviewing separately obtained history Performing a medically appropriate examination and/or evaluation Counseling and educating the patient/family/caregiver Ordering medications, tests, or procedures Referring and communicating with other health care professionals (when not separately reported) Documenting clinical information in the electronic or other health record Independently interpreting results (not separately reported) and communicating results to the patient/ family/caregiver Care coordination (not separately reported)  Note by: Oswaldo Done, MD Date: 08/29/2022; Time: 2:11 PM

## 2022-08-29 NOTE — Progress Notes (Signed)
Nursing Pain Medication Assessment:  Safety precautions to be maintained throughout the outpatient stay will include: orient to surroundings, keep bed in low position, maintain call bell within reach at all times, provide assistance with transfer out of bed and ambulation.  Medication Inspection Compliance: Pill count conducted under aseptic conditions, in front of the patient. Neither the pills nor the bottle was removed from the patient's sight at any time. Once count was completed pills were immediately returned to the patient in their original bottle.  Medication #1: Oxycodone IR Pill/Patch Count:  0 of 60 pills remain Pill/Patch Appearance: Markings consistent with prescribed medication Bottle Appearance: Standard pharmacy container. Clearly labeled. Filled Date: 6 / 10 / 2024 Last Medication intake:  Ran out of medicine more than 48 hours ago  Medication #2: Hydrocodone/APAP Pill/Patch Count:  0 of 15 pills remain Pill/Patch Appearance: Markings consistent with prescribed medication Bottle Appearance: Standard pharmacy container. Clearly labeled. Filled Date: 28 / 27 / 2024 Last Medication intake:  Ran out of medicine more than 48 hours ago  Safety precautions to be maintained throughout the outpatient stay will include: orient to surroundings, keep bed in low position, maintain call bell within reach at all times, provide assistance with transfer out of bed and ambulation.

## 2022-09-05 ENCOUNTER — Ambulatory Visit
Admission: RE | Admit: 2022-09-05 | Discharge: 2022-09-05 | Disposition: A | Discharge status: Home / Self Care | Payer: Medicaid Other | Source: Ambulatory Visit | Attending: Infectious Diseases | Admitting: Infectious Diseases

## 2022-09-05 DIAGNOSIS — R911 Solitary pulmonary nodule: Secondary | ICD-10-CM | POA: Diagnosis present

## 2022-09-05 DIAGNOSIS — J439 Emphysema, unspecified: Secondary | ICD-10-CM | POA: Diagnosis not present

## 2022-09-05 LAB — GLUCOSE, CAPILLARY: Glucose-Capillary: 116 mg/dL — ABNORMAL HIGH (ref 70–99)

## 2022-09-05 MED ORDER — FLUDEOXYGLUCOSE F - 18 (FDG) INJECTION
8.0000 | Freq: Once | INTRAVENOUS | Status: AC | PRN
  Administered 2022-09-05: 8.47 via INTRAVENOUS

## 2022-11-18 NOTE — Progress Notes (Unsigned)
PROVIDER NOTE: Information contained herein reflects review and annotations entered in association with encounter. Interpretation of such information and data should be left to medically-trained personnel. Information provided to patient can be located elsewhere in the medical record under "Patient Instructions". Document created using STT-dictation technology, any transcriptional errors that may result from process are unintentional.    Patient: Tanya [name]  Service Category: E/M  Provider: Oswaldo Done, MD  DOB: May 13, 1960  DOS: 11/20/2022  Referring Provider: Mick Sell, MD  MRN: [id number]  Specialty: Interventional Pain Management  PCP: Mick Sell, MD  Type: Established Patient  Setting: Ambulatory outpatient    Location: Office  Delivery: Face-to-face     HPI  [name], a 62 y.o. year old female, is here today because of her No primary diagnosis found.. Tanya [name] primary complain today is No chief complaint on file.  Pertinent problems: Tanya [name] has Chronic upper back pain (2ry area of Pain) (Bilateral) (R>L); Chronic low back pain (3ry area of Pain) (Bilateral) (R>L) w/o sciatica; Chronic neck pain (1ry area of Pain) (Bilateral) (R>L); Fibromyalgia; Osteoarthritis; Chronic pain syndrome; Chronic upper extremity pain (Right); Chronic cervical radicular pain (Right); Chronic shoulder pain (Bilateral) (R>L); Chronic musculoskeletal pain; Cervical spondylosis; Cervical facet syndrome (Bilateral) (R>L); Shoulder radicular pain (Bilateral) (R>L); Acute postoperative pain; Spondylosis without myelopathy or radiculopathy, cervical region; Cervicalgia; Occipital headache (Left); Neurogenic pain; Cervico-occipital neuralgia (Left); Chronic elbow pain (Left); Osteoarthritis of AC (acromioclavicular) joint (Right); Chronic acromioclavicular joint pain (Right); Chronic shoulder pain (Right); Epicondylitis elbow, medial (Left); Osteoarthritis of shoulders  (Bilateral) (R>L); Chronic shoulder pain (Left); Osteoarthritis of shoulder (Left); DDD (degenerative disc disease), cervical; Arthropathy of cervical facet joint (Bilateral); Cervical spine pain; Abnormal MRI, cervical spine (09/20/2015); Cervical central spinal stenosis (Multilevel); 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 Pain Assessment: Severity of   is reported as a  /10. Location:    / . Onset:  . Quality:  . Timing:  . Modifying factor(s):  Marland Kitchen Vitals:  vitals were not taken for this visit.  BMI: Estimated body mass index is 27.46 kg/m as calculated from the following:   Height as of 08/29/22: 5\' 3"  (1.6 m).   Weight as of 08/29/22: 155 lb (70.3 kg). Last encounter: 08/29/2022. Last procedure: 07/18/2022.  Reason for encounter:  *** . ***  Pharmacotherapy Assessment  Analgesic: None MME/day: 0 mg/day.   Monitoring: [place] PMP: PDMP reviewed during this encounter.       Pharmacotherapy: No side-effects or adverse reactions reported. Compliance: No problems identified. Effectiveness: Clinically acceptable.  No notes on file  No results found for: "CBDTHCR" No results found for: "D8THCCBX" No results found for: "D9THCCBX"  UDS:  Summary  Date Value Ref Range Status  05/01/2022 Note  Final    Comment:    ==================================================================== [employer] Select 13 (MW) ==================================================================== Specimen Alert Note: Urinary creatinine is low; ability to detect some drugs may be compromised. Interpret results with caution. (Creatinine) ==================================================================== Test                             Result       Flag       Units  Drug Absent but Declared for Prescription Verification    Oxycodone  Not Detected UNEXPECTED ng/mg creat ==================================================================== Test                      Result    Flag   Units      Ref Range   Creatinine              11        LL     mg/dL      >=44 ==================================================================== Declared Medications:  The flagging and interpretation on this report are based on the  following declared medications.  Unexpected results may arise from  inaccuracies in the declared medications.   **Note: The testing scope of this panel includes these medications:   Oxycodone (Roxicodone)   **Note: The testing scope of this panel does not include the  following reported medications:   Albuterol (Ventolin HFA)  Atorvastatin (Lipitor)  Clobetasol (Temovate)  Duloxetine (Cymbalta)  Folic Acid  Gabapentin (Neurontin)  Hydroxychloroquine (Plaquenil)  Hydroxyzine (Vistaril)  Methotrexate  Naloxone (Narcan)  Nicotine ==================================================================== For clinical consultation, please call [phone]. ====================================================================       ROS  Constitutional: Denies any fever or chills Gastrointestinal: No reported hemesis, hematochezia, vomiting, or acute GI distress Musculoskeletal: Denies any acute onset joint swelling, redness, loss of ROM, or weakness Neurological: No reported episodes of acute onset apraxia, aphasia, dysarthria, agnosia, amnesia, paralysis, loss of coordination, or loss of consciousness  Medication Review  DULoxetine, albuterol, atorvastatin, baclofen, clobetasol cream, gabapentin, hydrOXYzine, hydroxychloroquine, mycophenolate, naloxone, nicotine, and oxyCODONE  History Review  Allergy: Tanya [name] is allergic to pregabalin, trazodone, bupropion, methocarbamol, and tramadol. Drug: Tanya [name]  reports no history of drug use. Alcohol:  reports no  history of alcohol use. Tobacco:  reports that she has been smoking cigarettes. She has a 35 pack-year smoking history. She has been exposed to tobacco smoke. She has never used smokeless tobacco. Social: Tanya [name]  reports that she has been smoking cigarettes. She has a 35 pack-year smoking history. She has been exposed to tobacco smoke. She has never used smokeless tobacco. She reports that she does not drink alcohol and does not use drugs. Medical:  has a past medical history of Anxiety, Bladder infection, Chronic lower back pain, Chronic neck pain, Collagen vascular disease (HCC), COVID-19, Depression, Diverticulitis, GERD (gastroesophageal reflux disease), Hyperlipidemia, Lupus (HCC), and Overactive bladder. Surgical: Tanya [name]  has a past surgical history that includes Abdominal hysterectomy; Colonoscopy; Colonoscopy with propofol (N/A, 07/03/2017); Tonsillectomy; Esophagogastroduodenoscopy (N/A, 10/21/2017); and Colonoscopy with propofol (N/A, 10/21/2017). Family: family history includes Alcohol abuse in her mother; Cancer in her mother; Depression in her mother; Diabetes in her brother; Drug abuse in her son; Emphysema in her mother; Glaucoma in her father; Heart disease in her father; Hypertension in her mother; Stroke in her mother.  Laboratory Chemistry Profile   Renal Lab Results  Component Value Date   BUN 7 06/09/2019   CREATININE 0.72 06/09/2019   BCR 15 09/03/2017   GFRAA >60 06/09/2019   GFRNONAA >60 06/09/2019    Hepatic Lab Results  Component Value Date   AST 26 06/09/2019   ALT 20 06/09/2019   ALBUMIN 4.1 06/09/2019   ALKPHOS 29 (L) 06/09/2019   LIPASE 35 06/09/2019    Electrolytes Lab Results  Component Value Date   NA 135 06/09/2019   K 4.2 06/09/2019   CL 102 06/09/2019   CALCIUM 8.4 (L) 06/09/2019   MG 1.9 09/03/2017    Bone Lab Results  Component Value Date  25OHVITD1 25 (L) 09/03/2017   25OHVITD2 2.9 09/03/2017   25OHVITD3 22 09/03/2017     Inflammation (CRP: Acute Phase) (ESR: Chronic Phase) Lab Results  Component Value Date   CRP 2 09/03/2017   ESRSEDRATE 21 09/03/2017         Note: Above Lab results reviewed.  Recent Imaging Review  NM PET Image Initial (PI) Skull Base To Thigh (F-18 FDG) CLINICAL DATA:  Initial treatment strategy for pulmonary nodule.  EXAM: NUCLEAR MEDICINE PET SKULL BASE TO THIGH  TECHNIQUE: 8.5 mCi F-18 FDG was injected intravenously. Full-ring PET imaging was performed from the skull base to thigh after the radiotracer. CT data was obtained and used for attenuation correction and anatomic localization.  Fasting blood glucose: 116 mg/dl  COMPARISON:  CT chest dated 02/26/2022  FINDINGS: Mediastinal blood pool activity: SUV max 2.8  Liver activity: SUV max NA  NECK: No hypermetabolic cervical lymphadenopathy.  Incidental CT findings: None.  CHEST: 2.5 x 1.7 cm predominantly solid left upper lobe pulmonary nodule (series 4/image 44), grossly unchanged, max SUV 2.0.  No hypermetabolic thoracic lymphadenopathy.  Incidental CT findings: Atherosclerotic calcifications of the aorta arch. Aberrant right subclavian artery.  ABDOMEN/PELVIS: No abnormal hypermetabolism in the liver, spleen, pancreas, or adrenal glands.  No hypermetabolic abdominopelvic lymphadenopathy.  Incidental CT findings: Hepatic cysts. Extensive left colonic diverticulosis, without evidence of diverticulitis. Atherosclerotic calcifications of the abdominal aorta and branch vessels. Status post hysterectomy.  SKELETON: No focal hypermetabolic activity to suggest skeletal metastasis.  Incidental CT findings: None.  IMPRESSION: 2.5 cm left upper lobe pulmonary nodule, grossly unchanged. Associated mild in levels in. 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  No evidence of metastatic disease.  Electronically Signed   By: Charline Bills M.D.   On: 09/13/2022 02:32 Note: Reviewed        Physical Exam  General appearance: Well nourished, well developed, and well hydrated. In no apparent acute distress Mental status: Alert, oriented x 3 (person, place, & time)       Respiratory: No evidence of acute respiratory distress Eyes: PERLA Vitals: There were no vitals taken for this visit. BMI: Estimated body mass index is 27.46 kg/m as calculated from the following:   Height as of 08/29/22: 5\' 3"  (1.6 m).   Weight as of 08/29/22: 155 lb (70.3 kg). Ideal: Patient weight not recorded  Assessment   Diagnosis Status  No diagnosis found. Controlled Controlled Controlled   Updated Problems: No problems updated.  Plan of Care  Problem-specific:  No problem-specific Assessment & Plan notes found for this encounter.  Tanya [name] has a current medication list which includes the following long-term medication(s): duloxetine, gabapentin, oxycodone, oxycodone, oxycodone, and ventolin hfa.  Pharmacotherapy (Medications Ordered): No orders of the defined types were placed in this encounter.  Orders:  No orders of the defined types were placed in this encounter.  Follow-up plan:   No follow-ups on file.      Interventional Therapies  Risk Factors  Considerations:   Antiplatelet therapy: Plaquenil (Stop: 11 days  Restart: next day)  Risk of prolonged QT Syndrome  Tobacco abuse  Anxiety  Depression \ Insomnia  SLE  Urinary incontinence     Planned  Pending:   Therapeutic left cervical facet RFA #4    Under consideration:   Therapeutic bilateral cervical facet MB RFA #4 (starting with the left side)   Completed:   Therapeutic left IA shoulder joint inj. x1 (12/30/2019) (100/100/100  x 5 days/0)  Palliative right IA shoulder joint inj. x2 (10/06/2018) (100/100/50/50)  Palliative right AC joint inj. x3 (04/02/2018) (N/A)  Palliative left medial epicondyle elbow inj. x1 (04/02/2018) (N/A)   Diagnostic left GONB x1 (12/25/2017) (N/A)  Palliative right CESI x2 (02/12/2016) (N/A) Therapeutic left C7-T1 cervical ESI x1 (03/18/2016) (100/50/0/0)  Palliative bilateral cervical facet MBB x2 (06/26/2016)  Palliative right cervical facet RFA x3 (04/13/2020) (100/100/75/75)  Palliative left cervical facet RFA x3 (03/14/2020) (100/100/75/75)  Diagnostic/therapeutic midline C7-T1 TPI/MNB x1 (10/08/2021) (100/100/100/100)    Therapeutic  Palliative (PRN) options:   Palliative cervical facet MBB  Palliative cervical facet RFA     Pharmacotherapy  Nonopioids transfer 12/22/2019: Baclofen and Neurontin            Recent Visits Date Type Provider Dept  08/29/22 Office Visit Delano Metz, MD [facility]  Showing recent visits within past 90 days and meeting all other requirements Future Appointments Date Type Provider Dept  11/20/22 Appointment Delano Metz, MD [facility]  Showing future appointments within next 90 days and meeting all other requirements  I discussed the assessment and treatment plan with the patient. The patient was provided an opportunity to ask questions and all were answered. The patient agreed with the plan and demonstrated an understanding of the instructions.  Patient advised to call back or seek an in-person evaluation if the symptoms or condition worsens.  Duration of encounter: *** minutes.  Total time on encounter, as per AMA guidelines included both the face-to-face and non-face-to-face time personally spent by the physician and/or other qualified health care professional(s) on the day of the encounter (includes time in activities that require the physician or other qualified health care professional and does not include time in activities normally performed by clinical staff). Physician's time may include the following activities when performed: Preparing to see the patient (e.g., pre-charting review of records,  searching for previously ordered imaging, lab work, and nerve conduction tests) Review of prior analgesic pharmacotherapies. Reviewing PMP Interpreting ordered tests (e.g., lab work, imaging, nerve conduction tests) Performing post-procedure evaluations, including interpretation of diagnostic procedures Obtaining and/or reviewing separately obtained history Performing a medically appropriate examination and/or evaluation Counseling and educating the patient/family/caregiver Ordering medications, tests, or procedures Referring and communicating with other health care professionals (when not separately reported) Documenting clinical information in the electronic or other health record Independently interpreting results (not separately reported) and communicating results to the patient/ family/caregiver Care coordination (not separately reported)  Note by: Oswaldo Done, MD Date: 11/20/2022; Time: 1:05 PM

## 2022-11-20 ENCOUNTER — Ambulatory Visit: Payer: Medicaid Other | Attending: Pain Medicine | Admitting: Pain Medicine

## 2022-11-20 ENCOUNTER — Encounter: Payer: Self-pay | Admitting: Pain Medicine

## 2022-11-20 VITALS — BP 142/82 | HR 98 | Temp 97.3°F | Resp 18 | Ht 63.0 in | Wt 145.0 lb

## 2022-11-20 DIAGNOSIS — M25511 Pain in right shoulder: Secondary | ICD-10-CM | POA: Diagnosis present

## 2022-11-20 DIAGNOSIS — M545 Low back pain, unspecified: Secondary | ICD-10-CM

## 2022-11-20 DIAGNOSIS — M25512 Pain in left shoulder: Secondary | ICD-10-CM | POA: Diagnosis present

## 2022-11-20 DIAGNOSIS — G894 Chronic pain syndrome: Secondary | ICD-10-CM

## 2022-11-20 DIAGNOSIS — Z79899 Other long term (current) drug therapy: Secondary | ICD-10-CM

## 2022-11-20 DIAGNOSIS — M2550 Pain in unspecified joint: Secondary | ICD-10-CM

## 2022-11-20 DIAGNOSIS — M542 Cervicalgia: Secondary | ICD-10-CM

## 2022-11-20 DIAGNOSIS — M7918 Myalgia, other site: Secondary | ICD-10-CM | POA: Insufficient documentation

## 2022-11-20 DIAGNOSIS — M47812 Spondylosis without myelopathy or radiculopathy, cervical region: Secondary | ICD-10-CM

## 2022-11-20 DIAGNOSIS — Z79891 Long term (current) use of opiate analgesic: Secondary | ICD-10-CM

## 2022-11-20 DIAGNOSIS — M549 Dorsalgia, unspecified: Secondary | ICD-10-CM | POA: Insufficient documentation

## 2022-11-20 DIAGNOSIS — G8929 Other chronic pain: Secondary | ICD-10-CM

## 2022-11-20 MED ORDER — OXYCODONE HCL 5 MG PO TABS
5.0000 mg | ORAL_TABLET | Freq: Two times a day (BID) | ORAL | 0 refills | Status: DC | PRN
Start: 2022-11-27 — End: 2023-02-19

## 2022-11-20 MED ORDER — OXYCODONE HCL 5 MG PO TABS
5.0000 mg | ORAL_TABLET | Freq: Two times a day (BID) | ORAL | 0 refills | Status: DC | PRN
Start: 2022-12-27 — End: 2023-02-19

## 2022-11-20 MED ORDER — OXYCODONE HCL 5 MG PO TABS: 5.0000 mg | ORAL_TABLET | Freq: Two times a day (BID) | ORAL | 0 refills | Status: DC | PRN

## 2022-11-20 MED ORDER — NALOXONE HCL 4 MG/0.1ML NA LIQD
1.0000 | NASAL | 0 refills | Status: AC | PRN
Start: 2022-11-20 — End: 2023-11-20

## 2022-11-20 NOTE — Progress Notes (Signed)
Nursing Pain Medication Assessment:  Safety precautions to be maintained throughout the outpatient stay will include: orient to surroundings, keep bed in low position, maintain call bell within reach at all times, provide assistance with transfer out of bed and ambulation.  Medication Inspection Compliance: Pill count conducted under aseptic conditions, in front of the patient. Neither the pills nor the bottle was removed from the patient's sight at any time. Once count was completed pills were immediately returned to the patient in their original bottle.  Medication: Oxycodone IR Pill/Patch Count:  0 of 60 pills remain Pill/Patch Appearance: Markings consistent with prescribed medication Bottle Appearance: Standard pharmacy container. Clearly labeled. Filled Date: 89 / 7 / 2024 Last Medication intake:  Yesterday

## 2022-11-20 NOTE — Patient Instructions (Addendum)
______________________________________________________________________    OTC Supplements:   The following is a list of over-the-counter (OTC) supplements that have been found to have [facility] [employer]) studies suggesting that they may be of some benefits when used in moderation in some chronic pain-related conditions.  NOTE:  Always consult with your primary care provider and/or pharmacist before taking any OTC medications to make sure they will not interact with your current medications. Always use manufacturer's recommended dosage.  Supplement Possible benefit May be of benefit in treatment of  Active ingredient(s)  Turmeric/curcumin anti-inflammatory Joint and muscle aches and pain associated with arthritis and inflammation The main active ingredient in turmeric is curcumin.  Glucosamine/chondroitin (triple strength) may slow loss of articular cartilage Osteoarthritis glucosamine HCl and chondroitin sulfate  Vitamin D-3* may suppress release of chemicals associated with inflammation Joint and muscle aches and pain associated with arthritis and inflammation. It is known that vitamin D has a beneficial effect on the modulation of the immune system components responsible for the inflammatory process. Vitamin D3 may be an alternative supplementary treatment for chronic arthritis.  CHOLECALCIFEROL; ALPHA.-TOCOPHEROL, D  Moringa(+) anti-inflammatory with mild analgesic effects Joint and muscle aches and pain associated with arthritis and inflammation Many:  Phenolic acids: Gallic acid, ellagic acid, ferulic acid, caffeic acid, o-coumaric acid, and chlorogenic acid. Flavonoids: Rutin, quercetin, rhamnetin, kaempferol, apigenin, and myricetin. Terpenes: Lutein, all-E-luteoxanthin, 13-Z-lutein, 15-Z-?-carotene, and all-E-zeaxanthin. Alkaloids: Marumoside A, marumoside B, and pyrrolemarumine-4?-O-?-L-rhamnopyranoside. Vitamins: Vitamin A and vitamin C. Protein: Milk protein  Melatonin(+)  Helps reset sleep cycle. Insomnia. May also be helpful in neurodegenerative disorders melatonin, also known as N-acetyl-5-methoxytryptamine  Vitamin B-12* may help keep nerves and blood cells healthy as well as maintaining function of nervous system Neuropathies. Nerve pain (Burning pain) methylcobalamin and 5-deoxyadenosylcobalamin  Alpha-Lipoic-Acid (ALA)* antioxidant that may help with nerve health, pain, and blocking the activation of some inflammatory chemicals Diabetic neuropathy and metabolic syndrome Alpha-lipoic acid (LA), or 1,2-dithiolane-3-pentanoic acid  superoxide dismutase (SOD)** Currently being reviewed.  Superoxide dismutase (SOD); Copper-zinc superoxide dismutase  Tiger Balm Currently being reviewed.  Camphor; Menthol; Capsicum Extract (Capsicin); Methyl Salicylate; Essential Oils (Cassia Oil, Cajuput Oil, Clove Oil, and Dementholized Mint Oil)  hydrolyzed collagen peptides* Currently being reviewed. Possible chondroprotective effects. May help with protection of joint health. Possible skin protective effects. Collagen supplementation may increases bone strength, density, and mass; may improve joint stiffness/mobility, and functionality; and may reduce joint pain. Hydrolyzed collagen peptides caused diminished proinflammatory cytokine levels, an effect synergized significantly by the simultaneous treatment with vitamin D3. Vit D3 may help prevent tolerance to collagen supplementation.  Methylsulfonylmethane (MSM)* Currently being reviewed.    CBD(+) Currently being reviewed.    Delta-8 THC(+) Currently being reviewed.    *  Generally Recognized As Safe (GRAS) approved substance.-FDA (FindDrives.pl) ** "Possibly Safe", but not considered Generally Recognized As Safe (Not GRAS) by the [place] (FDA) as a food additive. (+) Not considered Generally Recognized As Safe (Not GRAS) by the  [employer] (FDA) as a food additive.  ______________________________________________________________________      ______________________________________________________________________    Opioid Pain Medication Update  To: All patients taking opioid pain medications. (I.e.: hydrocodone, hydromorphone, oxycodone, oxymorphone, morphine, codeine, methadone, tapentadol, tramadol, buprenorphine, fentanyl, etc.)  Re: Updated review of side effects and adverse reactions of opioid analgesics, as well as new information about long term effects of this class of medications.  Direct risks of long-term opioid therapy are not limited to opioid  addiction and overdose. Potential medical risks include serious fractures, breathing problems during sleep, hyperalgesia, immunosuppression, chronic constipation, bowel obstruction, myocardial infarction, and tooth decay secondary to xerostomia.  Unpredictable adverse effects that can occur even if you take your medication correctly: Cognitive impairment, respiratory depression, and death. Most people think that if they take their medication "correctly", and "as instructed", that they will be safe. Nothing could be farther from the truth. In reality, a significant amount of recorded deaths associated with the use of opioids has occurred in individuals that had taken the medication for a long time, and were taking their medication correctly. The following are examples of how this can happen: Patient taking his/her medication for a long time, as instructed, without any side effects, is given a certain antibiotic or another unrelated medication, which in turn triggers a "Drug-to-drug interaction" leading to disorientation, cognitive impairment, impaired reflexes, respiratory depression or an untoward event leading to serious bodily harm or injury, including death.  Patient taking his/her medication for a long time, as instructed, without any  side effects, develops an acute impairment of liver and/or kidney function. This will lead to a rapid inability of the body to breakdown and eliminate their pain medication, which will result in effects similar to an "overdose", but with the same medicine and dose that they had always taken. This again may lead to disorientation, cognitive impairment, impaired reflexes, respiratory depression or an untoward event leading to serious bodily harm or injury, including death.  A similar problem will occur with patients as they grow older and their liver and kidney function begins to decrease as part of the aging process.  Background information: Historically, the original case for using long-term opioid therapy to treat chronic noncancer pain was based on safety assumptions that subsequent experience has called into question. In 1996, the American Pain Society and the American Academy of Pain Medicine issued a consensus statement supporting long-term opioid therapy. This statement acknowledged the dangers of opioid prescribing but concluded that the risk for addiction was low; respiratory depression induced by opioids was short-lived, occurred mainly in opioid-naive patients, and was antagonized by pain; tolerance was not a common problem; and efforts to control diversion should not constrain opioid prescribing. This has now proven to be wrong. Experience regarding the risks for opioid addiction, misuse, and overdose in community practice has failed to support these assumptions.  According to the Centers for Disease Control and Prevention, fatal overdoses involving opioid analgesics have increased sharply over the past decade. Currently, more than 96,700 people die from drug overdoses every year. Opioids are a factor in 7 out of every 10 overdose deaths. Deaths from drug overdose have surpassed motor vehicle accidents as the leading cause of death for individuals between the ages of 39 and 36.  Clinical data  suggest that neuroendocrine dysfunction may be very common in both men and women, potentially causing hypogonadism, erectile dysfunction, infertility, decreased libido, osteoporosis, and depression. Recent studies linked higher opioid dose to increased opioid-related mortality. Controlled observational studies reported that long-term opioid therapy may be associated with increased risk for cardiovascular events. Subsequent meta-analysis concluded that the safety of long-term opioid therapy in elderly patients has not been proven.   Side Effects and adverse reactions: Common side effects: Drowsiness (sedation). Dizziness. Nausea and vomiting. Constipation. Physical dependence -- Dependence often manifests with withdrawal symptoms when opioids are discontinued or decreased. Tolerance -- As you take repeated doses of opioids, you require increased medication to experience the same effect of pain  relief. Respiratory depression -- This can occur in healthy people, especially with higher doses. However, people with COPD, asthma or other lung conditions may be even more susceptible to fatal respiratory impairment.  Uncommon side effects: An increased sensitivity to feeling pain and extreme response to pain (hyperalgesia). Chronic use of opioids can lead to this. Delayed gastric emptying (the process by which the contents of your stomach are moved into your small intestine). Muscle rigidity. Immune system and hormonal dysfunction. Quick, involuntary muscle jerks (myoclonus). Arrhythmia. Itchy skin (pruritus). Dry mouth (xerostomia).  Long-term side effects: Chronic constipation. Sleep-disordered breathing (SDB). Increased risk of bone fractures. Hypothalamic-pituitary-adrenal dysregulation. Increased risk of overdose.  RISKS: Respiratory depression and death: Opioids increase the risk of respiratory depression and death.  Drug-to-drug interactions: Opioids are relatively contraindicated in  combination with benzodiazepines, sleep inducers, and other central nervous system depressants. Other classes of medications (i.e.: certain antibiotics and even over-the-counter medications) may also trigger or induce respiratory depression in some patients.  Medical conditions: Patients with pre-existing respiratory problems are at higher risk of respiratory failure and/or depression when in combination with opioid analgesics. Opioids are relatively contraindicated in some medical conditions such as central sleep apnea.   Fractures and Falls:  Opioids increase the risk and incidence of falls. This is of particular importance in elderly patients.  Endocrine System:  Long-term administration is associated with endocrine abnormalities (endocrinopathies). (Also known as Opioid-induced Endocrinopathy) Influences on both the hypothalamic-pituitary-adrenal axis?and the hypothalamic-pituitary-gonadal axis have been demonstrated with consequent hypogonadism and adrenal insufficiency in both sexes. Hypogonadism and decreased levels of dehydroepiandrosterone sulfate have been reported in men and women. Endocrine effects include: Amenorrhoea in women (abnormal absence of menstruation) Reduced libido in both sexes Decreased sexual function Erectile dysfunction in men Hypogonadisms (decreased testicular function with shrinkage of testicles) Infertility Depression and fatigue Loss of muscle mass Anxiety Depression Immune suppression Hyperalgesia Weight gain Anemia Osteoporosis Patients (particularly women of childbearing age) should avoid opioids. There is insufficient evidence to recommend routine monitoring of asymptomatic patients taking opioids in the long-term for hormonal deficiencies.  Immune System: Human studies have demonstrated that opioids have an immunomodulating effect. These effects are mediated via opioid receptors both on immune effector cells and in the central nervous  system. Opioids have been demonstrated to have adverse effects on antimicrobial response and anti-tumour surveillance. Buprenorphine has been demonstrated to have no impact on immune function.  Opioid Induced Hyperalgesia: Human studies have demonstrated that prolonged use of opioids can lead to a state of abnormal pain sensitivity, sometimes called opioid induced hyperalgesia (OIH). Opioid induced hyperalgesia is not usually seen in the absence of tolerance to opioid analgesia. Clinically, hyperalgesia may be diagnosed if the patient on long-term opioid therapy presents with increased pain. This might be qualitatively and anatomically distinct from pain related to disease progression or to breakthrough pain resulting from development of opioid tolerance. Pain associated with hyperalgesia tends to be more diffuse than the pre-existing pain and less defined in quality. Management of opioid induced hyperalgesia requires opioid dose reduction.  Cancer: Chronic opioid therapy has been associated with an increased risk of cancer among noncancer patients with chronic pain. This association was more evident in chronic strong opioid users. Chronic opioid consumption causes significant pathological changes in the small intestine and colon. Epidemiological studies have found that there is a link between opium dependence and initiation of gastrointestinal cancers. Cancer is the second leading cause of death after cardiovascular disease. Chronic use of opioids can cause multiple conditions such  as GERD, immunosuppression and renal damage as well as carcinogenic effects, which are associated with the incidence of cancers.   Mortality: Long-term opioid use has been associated with increased mortality among patients with chronic non-cancer pain (CNCP).  Prescription of long-acting opioids for chronic noncancer pain was associated with a significantly increased risk of all-cause mortality, including deaths from  causes other than overdose.  Reference: Von Korff M, Kolodny A, Deyo RA, Chou R. Long-term opioid therapy reconsidered. Ann Intern Med. 2011 Sep 6;155(5):325-8. doi: 10.7326/[id number]. PMID: 65784696; PMCID: [id number]. Randon Goldsmith, Hayward RA, Dunn KM, [place] KP. Risk of adverse events in patients prescribed long-term opioids: A cohort study in the [place] [facility] Research D[employer]. Eur J Pain. 2019 May;23(5):908-922. doi: 10.1002/ejp.1357. Epub 2019 Jan 31. PMID: 40102725. Colameco S, Coren JS, Ciervo CA. Continuous opioid treatment for chronic noncancer pain: a time for moderation in prescribing. Postgrad Med. 2009 Jul;121(4):61-6. doi: 10.3810/pgm.2009.07.2032. PMID: 36644034. William Hamburger RN, [place], Blazina I, Cristopher Peru, Bougatsos C, Deyo RA. The effectiveness and risks of long-term opioid therapy for chronic pain: a systematic review for a [employer] Pathways to [employer]. Ann Intern Med. 2015 Feb 17;162(4):276-86. doi: 10.7326/M14-2559. PMID: 74259563. Caryl [facility], [place] DM. NCHS Data Brief No. 22. [place]: Centers for Disease Control and Prevention; 2009. Sep, Increase in Fatal Poisonings Involving Opioid Analgesics in the [place], 1999-2006. Song IA, Choi HR, Oh TK. Long-term opioid use and mortality in patients with chronic non-cancer pain: Ten-year follow-up study in [place] from 2010 through 2019. EClinicalMedicine. 2022 Jul 18;51:101558. doi: 10.1016/j.eclinm.2022.875643. PMID: 32951884; PMCID: [id number]. Huser, W., Schubert, T., Vogelmann, T. et al. All-cause mortality in patients with long-term opioid therapy compared with non-opioid analgesics for chronic non-cancer pain: a database study. BMC Med 18, 162 (2020). http://lester.info/ Rashidian H, Karie Kirks, Malekzadeh R, Haghdoost AA. An Ecological Study of the Association between Opiate  Use and Incidence of Cancers. Addict Health. 2016 Fall;8(4):252-260. PMID: 10932355; PMCID: [id number].  Our Goal: Our goal is to control your pain with means other than the use of opioid pain medications.  Our Recommendation: Talk to your physician about coming off of these medications. We can assist you with the tapering down and stopping these medicines. Based on the new information, even if you cannot completely stop the medication, a decrease in the dose may be associated with a lesser risk. Ask for other means of controlling the pain. Decrease or eliminate those factors that significantly contribute to your pain such as smoking, obesity, and a diet heavily tilted towards "inflammatory" nutrients.  Last Updated: 07/29/2022   ______________________________________________________________________       ______________________________________________________________________    National Pain Medication Shortage  The [place] is experiencing worsening drug shortages. These have had a negative widespread effect on patient care and treatment. Not expected to improve any time soon. Predicted to last past 2029.   Drug shortage list (generic names) Oxycodone IR Oxycodone/APAP Oxymorphone IR Hydromorphone Hydrocodone/APAP Morphine  Where is the problem?  Manufacturing and supply level.  Will this shortage affect you?  Only if you take any of the above pain medications.  How? You may be unable to fill your prescription.  Your pharmacist may offer a "partial fill" of your prescription. (Warning: Do not accept partial fills.) Prescriptions partially filled cannot be transferred to another pharmacy. Read our Medication Rules and Regulation. Depending on how much medicine you are dependent on, you may experience withdrawals  when unable to get the medication.  Recommendations: Consider ending your dependence on opioid pain medications. Ask your pain specialist to assist you with the  process. Consider switching to a medication currently not in shortage, such as Buprenorphine. Talk to your pain specialist about this option. Consider decreasing your pain medication requirements by managing tolerance thru "Drug Holidays". This may help minimize withdrawals, should you run out of medicine. Control your pain thru the use of non-pharmacological interventional therapies.   Your prescriber: Prescribers cannot be blamed for shortages. Medication manufacturing and supply issues cannot be fixed by the prescriber.   NOTE: The prescriber is not responsible for supplying the medication, or solving supply issues. Work with your pharmacist to solve it. The patient is responsible for the decision to take or continue taking the medication and for identifying and securing a legal supply source. By law, supplying the medication is the job and responsibility of the pharmacy. The prescriber is responsible for the evaluation, monitoring, and prescribing of these medications.   Prescribers will NOT: Re-issue prescriptions that have been partially filled. Re-issue prescriptions already sent to a pharmacy.  Re-send prescriptions to a different pharmacy because yours did not have your medication. Ask pharmacist to order more medicine or transfer the prescription to another pharmacy. (Read below.)  New 2023 regulation: "September 21, 2021 Revised Regulation Allows DEA-Registered Pharmacies to Transfer Electronic Prescriptions at a Patient's Request DEA Headquarters Division - Public Information Office Patients now have the ability to request their electronic prescription be transferred to another pharmacy without having to go back to their practitioner to initiate the request. This revised regulation went into effect on Monday, September 17, 2021.     At a patient's request, a DEA-registered retail pharmacy can now transfer an electronic prescription for a controlled substance (schedules II-V) to another  DEA-registered retail pharmacy. Prior to this change, patients would have to go through their practitioner to cancel their prescription and have it re-issued to a different pharmacy. The process was taxing and time consuming for both patients and practitioners.    The Drug Enforcement Administration [facility]) published its intent to revise the process for transferring electronic prescriptions on December 10, 2019.  The final rule was published in the federal register on August 16, 2021 and went into effect 30 days later.  Under the final rule, a prescription can only be transferred once between pharmacies, and only if allowed under existing state or other applicable law. The prescription must remain in its electronic form; may not be altered in any way; and the transfer must be communicated directly between two licensed pharmacists. It's important to note, any authorized refills transfer with the original prescription, which means the entire prescription will be filled at the same pharmacy".  Reference: HugeHand.is [facility] website announcement)  [email or web address].pdf Financial planner of Justice)   [employer] / Vol. 88, No. 143 / Thursday, August 16, 2021 / Rules and Regulations DEPARTMENT OF JUSTICE  Drug Enforcement Administration  21 CFR Part 1306  [Docket No. [id number]]  RIN [id number] Transfer of Electronic Prescriptions for Schedules II-V Controlled Substances Between Pharmacies for Initial Filling  ______________________________________________________________________       ______________________________________________________________________    Transfer of Pain Medication between Pharmacies  Re: 2023 DEA Clarification on existing regulation  Published on DEA Website: September 21, 2021  Title: Revised Regulation Allows  DEA-Registered Pharmacies to Electrical engineer Prescriptions at a Patient's Request DEA Headquarters Division - [employer]  "Patients now have the ability to  request their electronic prescription be transferred to another pharmacy without having to go back to their practitioner to initiate the request. This revised regulation went into effect on Monday, September 17, 2021.     At a patient's request, a DEA-registered retail pharmacy can now transfer an electronic prescription for a controlled substance (schedules II-V) to another DEA-registered retail pharmacy. Prior to this change, patients would have to go through their practitioner to cancel their prescription and have it re-issued to a different pharmacy. The process was taxing and time consuming for both patients and practitioners.    The Drug Enforcement Administration [facility]) published its intent to revise the process for transferring electronic prescriptions on December 10, 2019.  The final rule was published in the federal register on August 16, 2021 and went into effect 30 days later.  Under the final rule, a prescription can only be transferred once between pharmacies, and only if allowed under existing state or other applicable law. The prescription must remain in its electronic form; may not be altered in any way; and the transfer must be communicated directly between two licensed pharmacists. It's important to note, any authorized refills transfer with the original prescription, which means the entire prescription will be filled at the same pharmacy."    REFERENCES: 1. DEA website announcement [email or web address]  2. Department of Justice website  [email or web address].pdf  3. DEPARTMENT OF JUSTICE Drug Enforcement Administration 21 CFR Part 1306 [Docket No. [id number]] RIN  [id number] "Transfer of Electronic Prescriptions for Schedules II-V Controlled Substances Between Pharmacies for Initial Filling"  ______________________________________________________________________       ______________________________________________________________________    Medication Rules  Purpose: To inform patients, and their family members, of our medication rules and regulations.  Applies to: All patients receiving prescriptions from our practice (written or electronic).  Pharmacy of record: This is the pharmacy where your electronic prescriptions will be sent. Make sure we have the correct one.  Electronic prescriptions: In compliance with the [facility] Strengthen Opioid Misuse Prevention (STOP) Act of 2017 (Session Conni Elliot [phone]), effective January 21, 2018, all controlled substances must be electronically prescribed. Written prescriptions, faxing, or calling prescriptions to a pharmacy will no longer be done.  Prescription refills: These will be provided only during in-person appointments. No medications will be renewed without a "face-to-face" evaluation with your provider. Applies to all prescriptions.  NOTE: The following applies primarily to controlled substances (Opioid* Pain Medications).   Type of encounter (visit): For patients receiving controlled substances, face-to-face visits are required. (Not an option and not up to the patient.)  Patient's Responsibilities: Pain Pills: Bring all pain pills to every appointment (except for procedure appointments). Pill counts are required.  Pill Bottles: Bring pills in original pharmacy bottle. Bring bottle, even if empty. Always bring the bottle of the most recent fill.  Medication refills: You are responsible for knowing and keeping track of what medications you are taking and when is it that you will need a refill. The day before your appointment: write a list of all prescriptions that need to be refilled. The day  of the appointment: give the list to the admitting nurse. Prescriptions will be written only during appointments. No prescriptions will be written on procedure days. If you forget a medication: it will not be "Called in", "Faxed", or "electronically sent". You will need to get another appointment to get these prescribed. No early refills. Do not call asking to have your prescription filled early. Partial  or short prescriptions: Occasionally your pharmacy may not  have enough pills to fill your prescription.  NEVER ACCEPT a partial fill or a prescription that is short of the total amount of pills that you were prescribed.  With controlled substances the law allows 72 hours for the pharmacy to complete the prescription.  If the prescription is not completed within 72 hours, the pharmacist will require a new prescription to be written. This means that you will be short on your medicine and we WILL NOT send another prescription to complete your original prescription.  Instead, request the pharmacy to send a carrier to a nearby branch to get enough medication to provide you with your full prescription. Prescription Accuracy: You are responsible for carefully inspecting your prescriptions before leaving our office. Have the discharge nurse carefully go over each prescription with you, before taking them home. Make sure that your name is accurately spelled, that your address is correct. Check the name and dose of your medication to make sure it is accurate. Check the number of pills, and the written instructions to make sure they are clear and accurate. Make sure that you are given enough medication to last until your next medication refill appointment. Taking Medication: Take medication as prescribed. When it comes to controlled substances, taking less pills or less frequently than prescribed is permitted and encouraged. Never take more pills than instructed. Never take the medication more frequently than  prescribed.  Inform other Doctors: Always inform, all of your healthcare providers, of all the medications you take. Pain Medication from other Providers: You are not allowed to accept any additional pain medication from any other Doctor or Healthcare provider. There are two exceptions to this rule. (see below) In the event that you require additional pain medication, you are responsible for notifying us, as stated below. Cough Medicine: Often these contain an opioid, such as codeine or hydrocodone. Never accept or take cough medicine containing these opioids if you are already taking an opioid* medication. The combination may cause respiratory failure and death. Medication Agreement: You are responsible for carefully reading and following our Medication Agreement. This must be signed before receiving any prescriptions from our practice. Safely store a copy of your signed Agreement. Violations to the Agreement will result in no further prescriptions. (Additional copies of our Medication Agreement are available upon request.) Laws, Rules, & Regulations: All patients are expected to follow all [address] and [employer], Rules, [place]. Ignorance of the Laws does not constitute a valid excuse.  Illegal drugs and Controlled Substances: The use of illegal substances (including, but not limited to marijuana and its derivatives) and/or the illegal use of any controlled substances is strictly prohibited. Violation of this rule may result in the immediate and permanent discontinuation of any and all prescriptions being written by our practice. The use of any illegal substances is prohibited. Adopted CDC guidelines & recommendations: Target dosing levels will be at or below 60 MME/day. Use of benzodiazepines** is not recommended. Urine Drug testing: Patients taking controlled substances will be required to provide a urine sample upon request. Do not void before coming to your medication management  appointments. Hold emptying your bladder until you are admitted. The admitting nurse will inform you if a sample is required. Our practice reserves the right to call you at any time to provide a sample. Once receiving the call, you have 24 hours to comply with request. Not providing a sample upon request may result in termination of medication therapy.  Exceptions: There are only two exceptions to  the rule of not receiving pain medications from other Healthcare Providers. Exception #1 (Emergencies): In the event of an emergency (i.e.: accident requiring emergency care), you are allowed to receive additional pain medication. However, you are responsible for: As soon as you are able, call our office [phone], at any time of the day or night, and leave a message stating your name, the date and nature of the emergency, and the name and dose of the medication prescribed. In the event that your call is answered by a member of our staff, make sure to document and save the date, time, and the name of the person that took your information.  Exception #2 (Planned Surgery): In the event that you are scheduled by another doctor or dentist to have any type of surgery or procedure, you are allowed (for a period no longer than 30 days), to receive additional pain medication, for the acute post-op pain. However, in this case, you are responsible for picking up a copy of our "Post-op Pain Management for Surgeons" handout, and giving it to your surgeon or dentist. This document is available at our office, and does not require an appointment to obtain it. Simply go to our office during business hours (Monday-Thursday from 8:00 AM to 4:00 PM) (Friday 8:00 AM to 12:00 Noon) or if you have a scheduled appointment with [place], prior to your surgery, and ask for it by name. In addition, you are responsible for: calling our office [phone], at any time of the day or night, and leaving a message stating your name, name of your  surgeon, type of surgery, and date of procedure or surgery. Failure to comply with your responsibilities may result in termination of therapy involving the controlled substances.  Consequences:  Non-compliance with the above rules may result in permanent discontinuation of medication prescription therapy. All patients receiving any type of controlled substance is expected to comply with the above patient responsibilities. Not doing so may result in permanent discontinuation of medication prescription therapy. Medication Agreement Violation. Following the above rules, including your responsibilities will help you in avoiding a Medication Agreement Violation ("Breaking your Pain Medication Contract").  *Opioid medications include: morphine, codeine, oxycodone, oxymorphone, hydrocodone, hydromorphone, meperidine, tramadol, tapentadol, buprenorphine, fentanyl, methadone. **Benzodiazepine medications include: diazepam (Valium), alprazolam (Xanax), clonazepam (Klonopine), lorazepam (Ativan), clorazepate (Tranxene), chlordiazepoxide (Librium), estazolam (Prosom), oxazepam (Serax), temazepam (Restoril), triazolam (Halcion) (Last updated: 11/13/2022) ______________________________________________________________________      ______________________________________________________________________    Medication Recommendations and Reminders  Applies to: All patients receiving prescriptions (written and/or electronic).  Medication Rules & Regulations: You are responsible for reading, knowing, and following our "Medication Rules" document. These exist for your safety and that of others. They are not flexible and neither are we. Dismissing or ignoring them is an act of "non-compliance" that may result in complete and irreversible termination of such medication therapy. For safety reasons, "non-compliance" will not be tolerated. As with the [place] fundamental legal principle of "ignorance of the law is no defense",  we will accept no excuses for not having read and knowing the content of documents provided to you by our practice.  Pharmacy of record:  Definition: This is the pharmacy where your electronic prescriptions will be sent.  We do not endorse any particular pharmacy. It is up to you and your insurance to decide what pharmacy to use.  We do not restrict you in your choice of pharmacy. However, once we write for your prescriptions, we will NOT be re-sending more prescriptions to  fix restricted supply problems created by your pharmacy, or your insurance.  The pharmacy listed in the electronic medical record should be the one where you want electronic prescriptions to be sent. If you choose to change pharmacy, simply notify our nursing staff. Changes will be made only during your regular appointments and not over the phone.  Recommendations: Keep all of your pain medications in a safe place, under lock and key, even if you live alone. We will NOT replace lost, stolen, or damaged medication. We do not accept "Police Reports" as proof of medications having been stolen. After you fill your prescription, take 1 week's worth of pills and put them away in a safe place. You should keep a separate, properly labeled bottle for this purpose. The remainder should be kept in the original bottle. Use this as your primary supply, until it runs out. Once it's gone, then you know that you have 1 week's worth of medicine, and it is time to come in for a prescription refill. If you do this correctly, it is unlikely that you will ever run out of medicine. To make sure that the above recommendation works, it is very important that you make sure your medication refill appointments are scheduled at least 1 week before you run out of medicine. To do this in an effective manner, make sure that you do not leave the office without scheduling your next medication management appointment. Always ask the nursing staff to show you in your  prescription , when your medication will be running out. Then arrange for the receptionist to get you a return appointment, at least 7 days before you run out of medicine. Do not wait until you have 1 or 2 pills left, to come in. This is very poor planning and does not take into consideration that we may need to cancel appointments due to bad weather, sickness, or emergencies affecting our staff. DO NOT ACCEPT A "Partial Fill": If for any reason your pharmacy does not have enough pills/tablets to completely fill or refill your prescription, do not allow for a "partial fill". The law allows the pharmacy to complete that prescription within 72 hours, without requiring a new prescription. If they do not fill the rest of your prescription within those 72 hours, you will need a separate prescription to fill the remaining amount, which we will NOT provide. If the reason for the partial fill is your insurance, you will need to talk to the pharmacist about payment alternatives for the remaining tablets, but again, DO NOT ACCEPT A PARTIAL FILL, unless you can trust your pharmacist to obtain the remainder of the pills within 72 hours.  Prescription refills and/or changes in medication(s):  Prescription refills, and/or changes in dose or medication, will be conducted only during scheduled medication management appointments. (Applies to both, written and electronic prescriptions.) No refills on procedure days. No medication will be changed or started on procedure days. No changes, adjustments, and/or refills will be conducted on a procedure day. Doing so will interfere with the diagnostic portion of the procedure. No phone refills. No medications will be "called into the pharmacy". No Fax refills. No weekend refills. No Holliday refills. No after hours refills.  Remember:  Business hours are:  Monday to Thursday 8:00 AM to 4:00 PM Provider's Schedule: Delano Metz, MD - Appointments are:  Medication  management: Monday and Wednesday 8:00 AM to 4:00 PM Procedure day: Tuesday and Thursday 7:30 AM to 4:00 PM Edward Jolly, MD - Appointments are:  Medication management: Tuesday and Thursday 8:00 AM to 4:00 PM Procedure day: Monday and Wednesday 7:30 AM to 4:00 PM (Last update: 11/13/2021) ______________________________________________________________________      ______________________________________________________________________     Naloxone Nasal Spray  Why am I receiving this medication? [place] STOP ACT requires that all patients taking high dose opioids or at risk of opioids respiratory depression, be prescribed an opioid reversal agent, such as Naloxone (AKA: Narcan).  What is this medication? NALOXONE (nal OX one) treats opioid overdose, which causes slow or shallow breathing, severe drowsiness, or trouble staying awake. Call emergency services after using this medication. You may need additional treatment. Naloxone works by reversing the effects of opioids. It belongs to a group of medications called opioid blockers.  COMMON BRAND NAME(S): Kloxxado, Narcan  What should I tell my care team before I take this medication? They need to know if you have any of these conditions: Heart disease Substance use disorder An unusual or allergic reaction to naloxone, other medications, foods, dyes, or preservatives Pregnant or trying to get pregnant Breast-feeding  When to use this medication? This medication is to be used for the treatment of respiratory depression (less than 8 breaths per minute) secondary to opioid overdose.   How to use this medication? This medication is for use in the nose. Lay the person on their back. Support their neck with your hand and allow the head to tilt back before giving the medication. The nasal spray should be given into 1 nostril. After giving the medication, move the person onto their side. Do not remove or test the nasal spray until ready to  use. Get emergency medical help right away after giving the first dose of this medication, even if the person wakes up. You should be familiar with how to recognize the signs and symptoms of a narcotic overdose. If more doses are needed, give the additional dose in the other nostril. Talk to your care team about the use of this medication in children. While this medication may be prescribed for children as young as newborns for selected conditions, precautions do apply.  Naloxone Overdosage: If you think you have taken too much of this medicine contact a poison control center or emergency room at once.  NOTE: This medicine is only for you. Do not share this medicine with others.  What if I miss a dose? This does not apply.  What may interact with this medication? This is only used during an emergency. No interactions are expected during emergency use. This list may not describe all possible interactions. Give your health care provider a list of all the medicines, herbs, non-prescription drugs, or dietary supplements you use. Also tell them if you smoke, drink alcohol, or use illegal drugs. Some items may interact with your medicine.  What should I watch for while using this medication? Keep this medication ready for use in the case of an opioid overdose. Make sure that you have the phone number of your care team and local hospital ready. You may need to have additional doses of this medication. Each nasal spray contains a single dose. Some emergencies may require additional doses. After use, bring the treated person to the nearest hospital or call 911. Make sure the treating care team knows that the person has received a dose of this medication. You will receive additional instructions on what to do during and after use of this medication before an emergency occurs.  What side effects may I notice from receiving this  medication? Side effects that you should report to your care team as soon as  possible: Allergic reactions--skin rash, itching, hives, swelling of the face, lips, tongue, or throat Side effects that usually do not require medical attention (report these to your care team if they continue or are bothersome): Constipation Dryness or irritation inside the nose Headache Increase in blood pressure Muscle spasms Stuffy nose Toothache This list may not describe all possible side effects. Call your doctor for medical advice about side effects. You may report side effects to FDA at 1-800-FDA-1088.  Where should I keep my medication? Because this is an emergency medication, you should keep it with you at all times.  Keep out of the reach of children and pets. Store between 20 and 25 degrees C (68 and 77 degrees F). Do not freeze. Throw away any unused medication after the expiration date. Keep in original box until ready to use.  NOTE: This sheet is a summary. It may not cover all possible information. If you have questions about this medicine, talk to your doctor, pharmacist, or health care provider.   2023 [employer] Standard (2020-09-15 00:00:00)  ______________________________________________________________________

## 2022-11-27 ENCOUNTER — Telehealth: Payer: Self-pay | Admitting: Pain Medicine

## 2022-11-27 NOTE — Telephone Encounter (Signed)
PT called stated that the pharmacy need PA to be send in for Oxycodone. Please give [facility] a call and patient. TY

## 2022-12-22 ENCOUNTER — Ambulatory Visit
Admission: EM | Admit: 2022-12-22 | Discharge: 2022-12-22 | Disposition: A | Discharge status: Home / Self Care | Payer: Medicaid Other

## 2022-12-22 DIAGNOSIS — J069 Acute upper respiratory infection, unspecified: Secondary | ICD-10-CM | POA: Diagnosis not present

## 2022-12-22 DIAGNOSIS — J441 Chronic obstructive pulmonary disease with (acute) exacerbation: Secondary | ICD-10-CM

## 2022-12-22 MED ORDER — LEVOFLOXACIN 500 MG PO TABS: 500.0000 mg | ORAL_TABLET | Freq: Every day | ORAL | 0 refills | Status: DC

## 2022-12-22 MED ORDER — PROMETHAZINE-DM 6.25-15 MG/5ML PO SYRP: 5.0000 mL | ORAL_SOLUTION | Freq: Four times a day (QID) | ORAL | 0 refills | Status: DC | PRN

## 2022-12-22 MED ORDER — BENZONATATE 100 MG PO CAPS: 200.0000 mg | ORAL_CAPSULE | Freq: Three times a day (TID) | ORAL | 0 refills | Status: DC

## 2022-12-22 MED ORDER — PREDNISONE 20 MG PO TABS: 60.0000 mg | ORAL_TABLET | Freq: Every day | ORAL | 0 refills | Status: AC

## 2022-12-22 MED ORDER — IPRATROPIUM BROMIDE 0.06 % NA SOLN: 2.0000 | Freq: Four times a day (QID) | NASAL | 12 refills | Status: DC

## 2022-12-22 NOTE — ED Provider Notes (Signed)
[facility]    CSN: [id number] Arrival date & time: 12/22/22  1447      History   Chief Complaint Chief Complaint  Patient presents with   Headache   Chest Congestion    HPI JOYCELINE [name] is a 62 y.o. female.   HPI  62 year old female with a past medical history significant for lupus, hyperlipidemia, GERD, chronic back pain, and COPD presents for evaluation of respiratory symptoms that been going on for over a week.  She reports subjective fever with nasal congestion and yellow nasal discharge, a cough that is productive for a yellow-green mucus and is worse at night.  No shortness of breath or wheezing.  She has been using her albuterol inhaler.  Past Medical History:  Diagnosis Date   Anxiety    Bladder infection    8/18   Chronic lower back pain    Chronic neck pain    Collagen vascular disease (HCC)    COVID-19    Depression    Diverticulitis    GERD (gastroesophageal reflux disease)    Hyperlipidemia    Lupus    Overactive bladder     Patient Active Problem List   Diagnosis Date Noted   Chronic generalized arthralgias of multiple sites 11/20/2022   Cervical facet joint pain 05/21/2022   Chronic anticoagulation (Plaquenil) 05/01/2022   Trigger point with neck pain 10/08/2021   Acute upper back pain (Midline) 10/08/2021   Cervicothoracic interspinous bursitis 10/08/2021   Prediabetes 08/13/2021   MDD (major depressive disorder), recurrent, in full remission (HCC) 07/19/2021   Moderate episode of recurrent major depressive disorder (HCC) 03/23/2021   Tobacco use disorder 03/23/2021   Insomnia 02/06/2021   At risk for prolonged QT interval syndrome 02/06/2021   Psychiatric illness 11/01/2020   Chronic use of opiate for therapeutic purpose 06/25/2020   Arthropathy of cervical facet joint (Bilateral) 04/13/2020   Cervical spine pain 04/13/2020   Abnormal MRI, cervical spine (09/20/2015) 04/13/2020   Cervical central spinal stenosis  (Multilevel) 04/13/2020   Cervical foraminal stenosis (Right: C3-4) (Bilateral: C4-5, C5-6) 04/13/2020   DDD (degenerative disc disease), cervical 03/14/2020   Chronic shoulder pain (Left) 09/06/2019   Osteoarthritis of shoulder (Left) 09/06/2019   Osteoarthritis of shoulders (Bilateral) (R>L) 10/25/2018   Osteoarthritis of AC (acromioclavicular) joint (Right) 04/02/2018   Chronic acromioclavicular joint pain (Right) 04/02/2018   Chronic shoulder pain (Right) 04/02/2018   Epicondylitis elbow, medial (Left) 04/02/2018   Chronic elbow pain (Left) 03/25/2018   Occipital headache (Left) 12/10/2017   Neurogenic pain 12/10/2017   Cervico-occipital neuralgia (Left) 12/10/2017   Pharmacologic therapy 09/01/2017   Disorder of skeletal system 09/01/2017   Problems influencing health status 09/01/2017   Spondylosis without myelopathy or radiculopathy, cervical region 08/05/2017   Cervicalgia 08/05/2017   Rash 05/13/2017   Acute postoperative pain 06/26/2016   Cervical spondylosis 04/15/2016   Cervical facet syndrome (Bilateral) (R>L) 04/15/2016   Shoulder radicular pain (Bilateral) (R>L) 04/15/2016   Chronic musculoskeletal pain 03/07/2016   Vitamin D insufficiency 12/19/2015   Anxiety 11/23/2015   Chronic low back pain (3ry area of Pain) (Bilateral) (R>L) w/o sciatica 11/23/2015   Chronic neck pain (1ry area of Pain) (Bilateral) (R>L) 11/23/2015   Long term prescription benzodiazepine use 11/23/2015   Long term current use of opiate analgesic 11/23/2015   Long term prescription opiate use 11/23/2015   Opiate use (10 MME/Day) 11/23/2015   Chronic pain syndrome 11/23/2015   Chronic upper extremity pain (Right) 11/23/2015   Chronic  cervical radicular pain (Right) 11/23/2015   Chronic shoulder pain (Bilateral) (R>L) 11/23/2015   Hypercholesterolemia 06/20/2015   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   Encounter for long-term (current) use of high-risk medication 06/06/2015   Pain  medication agreement signed 11/24/2014   Hyperlipidemia 01/25/2014   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   Diverticulitis 10/04/2012   Osteoarthritis 10/04/2012   Chronic upper back pain (2ry area of Pain) (Bilateral) (R>L) 08/17/2012   Urinary incontinence 08/17/2012   Fibromyalgia 06/04/2012   SLE (systemic lupus erythematosus) (HCC) 02/18/2012   GAD (generalized anxiety disorder) 01/07/2012   Major depressive disorder, recurrent (HCC) 09/29/2009    Past Surgical History:  Procedure Laterality Date   ABDOMINAL HYSTERECTOMY     COLONOSCOPY     COLONOSCOPY WITH PROPOFOL N/A 07/03/2017   Procedure: COLONOSCOPY WITH PROPOFOL;  Surgeon: Christena Deem, MD;  Location: [facility] ENDOSCOPY;  Service: Endoscopy;  Laterality: N/A;   COLONOSCOPY WITH PROPOFOL N/A 10/21/2017   Procedure: COLONOSCOPY WITH PROPOFOL;  Surgeon: Christena Deem, MD;  Location: [facility] ENDOSCOPY;  Service: Endoscopy;  Laterality: N/A;   ESOPHAGOGASTRODUODENOSCOPY N/A 10/21/2017   Procedure: ESOPHAGOGASTRODUODENOSCOPY (EGD);  Surgeon: Christena Deem, MD;  Location: [facility] ENDOSCOPY;  Service: Endoscopy;  Laterality: N/A;   TONSILLECTOMY      OB History   No obstetric history on file.      Home Medications    Prior to Admission medications   Medication Sig Start Date End Date Taking? Authorizing Provider  atorvastatin (LIPITOR) 80 MG tablet Take 80 mg by mouth every evening. 04/19/20  Yes [provider]  baclofen (LIORESAL) 10 MG tablet Take 1 tablet by mouth at bedtime. 06/27/22 06/27/23 Yes [provider]  benzonatate (TESSALON) 100 MG capsule Take 2 capsules (200 mg total) by mouth every 8 (eight) hours. 12/22/22  Yes Becky Augusta, NP  clobetasol cream (TEMOVATE) 0.05 % Apply 1 Application topically every 3 (three) days.   Yes [provider]  gabapentin (NEURONTIN) 300 MG capsule Take 1 capsule (300 mg total) by mouth 3 (three) times daily. 06/26/20 03/03/23 Yes Delano Metz, MD   hydroxychloroquine (PLAQUENIL) 200 MG tablet Take 200 mg by mouth in the morning. 01/04/21  Yes [provider]  hydrOXYzine (VISTARIL) 25 MG capsule TAKE 1-2 CAPSULES BY MOUTH ONCE DAILY ASNEEDED FOR ANXIETY OR SLEEP 07/15/22  Yes Darcel Smalling, MD  ipratropium (ATROVENT) 0.06 % nasal spray Place 2 sprays into both nostrils 4 (four) times daily. 12/22/22  Yes Becky Augusta, NP  levofloxacin (LEVAQUIN) 500 MG tablet Take 1 tablet (500 mg total) by mouth daily. 12/22/22  Yes Becky Augusta, NP  mycophenolate (CELLCEPT) 500 MG tablet Take by mouth. 07/02/22 07/02/23 Yes [provider]  naloxone (NARCAN) nasal spray 4 mg/0.1 mL Place 1 spray into the nose as needed for up to 365 doses (for opioid-induced respiratory depresssion). In case of emergency (overdose), spray once into each nostril. If no response within 3 minutes, repeat application and call 911. 11/20/22 11/20/23 Yes Delano Metz, MD  nicotine (NICODERM CQ - DOSED IN MG/24 HOURS) 14 mg/24hr patch Place 1 patch (14 mg total) onto the skin daily. 03/07/22  Yes Jomarie Longs, MD  oxyCODONE (OXY IR/ROXICODONE) 5 MG immediate release tablet Take 1 tablet (5 mg total) by mouth 2 (two) times daily as needed for severe pain (pain score 7-10). Must last 30 days 11/27/22 12/27/22 Yes Delano Metz, MD  oxyCODONE (OXY IR/ROXICODONE) 5 MG immediate release tablet Take 1 tablet (5 mg total) by mouth 2 (  two) times daily as needed for severe pain (pain score 7-10). Must last 30 days 12/27/22 01/26/23 Yes Delano Metz, MD  oxyCODONE (OXY IR/ROXICODONE) 5 MG immediate release tablet Take 1 tablet (5 mg total) by mouth 2 (two) times daily as needed for severe pain (pain score 7-10). Must last 30 days 01/26/23 02/25/23 Yes Delano Metz, MD  predniSONE (DELTASONE) 20 MG tablet Take 3 tablets (60 mg total) by mouth daily with breakfast for 5 days. 3 tablets daily for 5 days. 12/22/22 12/27/22 Yes Becky Augusta, NP   promethazine-dextromethorphan (PROMETHAZINE-DM) 6.25-15 MG/5ML syrup Take 5 mLs by mouth 4 (four) times daily as needed. 12/22/22  Yes Becky Augusta, NP  TRINTELLIX 10 MG TABS tablet Take 10 mg by mouth daily.   Yes [provider]  VENTOLIN HFA 108 (90 Base) MCG/ACT inhaler INHALE 2 PUFFS INTO THE LUNGS EVERY 6 HOURS AS NEEDED FOR WHEEZING OR SHORTNESS OF BREATH 06/19/22  Yes Salena Saner, MD    Family History Family History  Problem Relation Age of Onset   Depression Mother    Alcohol abuse Mother    Cancer Mother    Hypertension Mother    Emphysema Mother    Stroke Mother    Glaucoma Father    Heart disease Father    Diabetes Brother    Drug abuse Son     Social History Social History   Tobacco Use   Smoking status: Every Day    Current packs/day: 1.00    Average packs/day: 1 pack/day for 35.0 years (35.0 ttl pk-yrs)    Types: Cigarettes    Passive exposure: Past   Smokeless tobacco: Never   Tobacco comments:    1ppd - 03/01/2021  Vaping Use   Vaping status: Never Used  Substance Use Topics   Alcohol use: No   Drug use: No     Allergies   Pregabalin, Trazodone, Bupropion, Methocarbamol, and Tramadol   Review of Systems Review of Systems  Constitutional:  Positive for fever.  HENT:  Positive for congestion and rhinorrhea. Negative for ear pain and sore throat.   Respiratory:  Positive for cough. Negative for shortness of breath and wheezing.      Physical Exam Triage Vital Signs ED Triage Vitals [12/22/22 1527]  Encounter Vitals Group     BP      Systolic BP Percentile      Diastolic BP Percentile      Pulse      Resp 16     Temp      Temp Source Oral     SpO2      Weight      Height      Head Circumference      Peak Flow      Pain Score      Pain Loc      Pain Education      Exclude from Growth Chart    No data found.  Updated Vital Signs BP 137/83 (BP Location: Left Arm)   Pulse 85   Temp 98.5 F (36.9 C) (Oral)   Resp 16    Ht 5\' 3"  (1.6 m)   Wt 148 lb (67.1 kg)   SpO2 94%   BMI 26.22 kg/m   Visual Acuity Right Eye Distance:   Left Eye Distance:   Bilateral Distance:    Right Eye Near:   Left Eye Near:    Bilateral Near:     Physical Exam Vitals and nursing note reviewed.  Constitutional:      Appearance: Normal appearance. She is not ill-appearing.  HENT:     Head: Normocephalic and atraumatic.     Right Ear: Tympanic membrane, ear canal and external ear normal. There is no impacted cerumen.     Left Ear: Tympanic membrane, ear canal and external ear normal. There is no impacted cerumen.     Nose: Congestion and rhinorrhea present.     Comments: Patient mucosa is erythematous and edematous with yellow discharge in both nares.    Mouth/Throat:     Mouth: Mucous membranes are moist.     Pharynx: Oropharynx is clear. No oropharyngeal exudate or posterior oropharyngeal erythema.  Cardiovascular:     Rate and Rhythm: Normal rate and regular rhythm.     Pulses: Normal pulses.     Heart sounds: Normal heart sounds. No murmur heard.    No friction rub. No gallop.  Pulmonary:     Effort: Pulmonary effort is normal.     Breath sounds: Normal breath sounds. No wheezing, rhonchi or rales.  Musculoskeletal:     Cervical back: Normal range of motion and neck supple.  Lymphadenopathy:     Cervical: No cervical adenopathy.  Skin:    General: Skin is warm and dry.     Capillary Refill: Capillary refill takes less than 2 seconds.     Findings: No rash.  Neurological:     General: No focal deficit present.     Mental Status: She is alert and oriented to person, place, and time.      UC Treatments / Results  Labs (all labs ordered are listed, but only abnormal results are displayed) Labs Reviewed - No data to display  EKG   Radiology No results found.  Procedures Procedures (including critical care time)  Medications Ordered in UC Medications - No data to display  Initial Impression /  Assessment and Plan / UC Course  I have reviewed the triage vital signs and the nursing notes.  Pertinent labs & imaging results that were available during my care of the patient were reviewed by me and considered in my medical decision making (see chart for details).   Patient is a nontoxic-appearing 62 year old female presenting for evaluation of greater than 1 week worth of respiratory symptoms outlined HPI above.  She does have inflamed nasal mucosa with yellow nasal discharge but her oropharyngeal exam is benign.  Cardiopulmonary exam reveals clear lung sounds in all fields.  Given her history of COPD and her cough is productive for purulent sputum I will discharge her home with a diagnosis of URI and COPD exacerbation on Levaquin 500 milligrams once daily for 7 days.  Atrovent nasal spray to help the nasal congestion and Tessalon Perles and Promethazine DM cough syrup for cough and congestion.  I will also prescribe 60 mg of prednisone daily starting tomorrow morning that she will take for 5 days to decrease pulmonary inflammation.  She denies needing a refill of her albuterol inhaler.  Return precautions reviewed.   Final Clinical Impressions(s) / UC Diagnoses   Final diagnoses:  URI with cough and congestion  COPD exacerbation (HCC)     Discharge Instructions      Take the Levaquin once daily for 7 days for treatment of your COPD exacerbation and URI.  Use your albuterol inhaler, 2 puffs every 4-6 hours as needed for shortness breath or wheezing.  Take the prednisone 60 mg daily at breakfast time starting today.  Use the Atrovent nasal  spray, 2 squirts up each nostril every 6 hours, as needed for runny nose and nasal congestion.  Use the Tessalon Perles every 8 hours as needed for cough during the day.  They will sometimes cause numbness to the base of your tongue or give you metallic taste in your mouth.  This is normal.  You will need to take them with a small sip of  water.  Use the Promethazine DM cough syrup at bedtime as needed for cough and congestion.  Return for reevaluation for new or worsening symptoms.      ED Prescriptions     Medication Sig Dispense Auth. Provider   levofloxacin (LEVAQUIN) 500 MG tablet Take 1 tablet (500 mg total) by mouth daily. 7 tablet Becky Augusta, NP   predniSONE (DELTASONE) 20 MG tablet Take 3 tablets (60 mg total) by mouth daily with breakfast for 5 days. 3 tablets daily for 5 days. 15 tablet Becky Augusta, NP   benzonatate (TESSALON) 100 MG capsule Take 2 capsules (200 mg total) by mouth every 8 (eight) hours. 21 capsule Becky Augusta, NP   ipratropium (ATROVENT) 0.06 % nasal spray Place 2 sprays into both nostrils 4 (four) times daily. 15 mL Becky Augusta, NP   promethazine-dextromethorphan (PROMETHAZINE-DM) 6.25-15 MG/5ML syrup Take 5 mLs by mouth 4 (four) times daily as needed. 118 mL Becky Augusta, NP      PDMP not reviewed this encounter.   Becky Augusta, NP 12/22/22 1544

## 2022-12-22 NOTE — Discharge Instructions (Addendum)
Take the Levaquin once daily for 7 days for treatment of your COPD exacerbation and URI.  Use your albuterol inhaler, 2 puffs every 4-6 hours as needed for shortness breath or wheezing.  Take the prednisone 60 mg daily at breakfast time starting today.  Use the Atrovent nasal spray, 2 squirts up each nostril every 6 hours, as needed for runny nose and nasal congestion.  Use the Tessalon Perles every 8 hours as needed for cough during the day.  They will sometimes cause numbness to the base of your tongue or give you metallic taste in your mouth.  This is normal.  You will need to take them with a small sip of water.  Use the Promethazine DM cough syrup at bedtime as needed for cough and congestion.  Return for reevaluation for new or worsening symptoms.

## 2022-12-22 NOTE — ED Triage Notes (Signed)
Pt c/o head & chest congestion x1 wk.

## 2023-01-10 ENCOUNTER — Ambulatory Visit (INDEPENDENT_AMBULATORY_CARE_PROVIDER_SITE_OTHER): Payer: Medicaid Other

## 2023-01-10 ENCOUNTER — Ambulatory Visit
Admission: EM | Admit: 2023-01-10 | Discharge: 2023-01-10 | Disposition: A | Discharge status: Home / Self Care | Payer: Medicaid Other | Attending: Emergency Medicine | Admitting: Emergency Medicine

## 2023-01-10 DIAGNOSIS — J0140 Acute pansinusitis, unspecified: Secondary | ICD-10-CM | POA: Insufficient documentation

## 2023-01-10 DIAGNOSIS — R051 Acute cough: Secondary | ICD-10-CM | POA: Insufficient documentation

## 2023-01-10 DIAGNOSIS — J441 Chronic obstructive pulmonary disease with (acute) exacerbation: Secondary | ICD-10-CM | POA: Diagnosis not present

## 2023-01-10 LAB — BASIC METABOLIC PANEL
Anion gap: 9 (ref 5–15)
BUN: 16 mg/dL (ref 6–20)
CO2: 26 mmol/L (ref 22–32)
Calcium: 9.4 mg/dL (ref 8.9–10.3)
Chloride: 103 mmol/L (ref 98–111)
Creatinine, Ser: 0.79 mg/dL (ref 0.44–1.00)
GFR, Estimated: >60 mL/min (ref >60)
Glucose, Bld: 90 mg/dL (ref 70–99)
Potassium: 3.9 mmol/L (ref 3.5–5.1)
Sodium: 138 mmol/L (ref 135–145)

## 2023-01-10 LAB — RESP PANEL BY RT-PCR (FLU A&B, COVID) ARPGX2
Influenza A by PCR: NEGATIVE
Influenza B by PCR: NEGATIVE
SARS Coronavirus 2 by RT PCR: NEGATIVE

## 2023-01-10 MED ORDER — AZITHROMYCIN 250 MG PO TABS: 250.0000 mg | ORAL_TABLET | Freq: Every day | ORAL | 0 refills | Status: DC

## 2023-01-10 MED ORDER — IPRATROPIUM-ALBUTEROL 0.5-2.5 (3) MG/3ML IN SOLN
3.0000 mL | Freq: Once | RESPIRATORY_TRACT | Status: AC
  Administered 2023-01-10: 3 mL via RESPIRATORY_TRACT

## 2023-01-10 MED ORDER — AMOXICILLIN-POT CLAVULANATE 875-125 MG PO TABS: 1.0000 | ORAL_TABLET | Freq: Two times a day (BID) | ORAL | 0 refills | Status: DC

## 2023-01-10 MED ORDER — PREDNISONE 50 MG PO TABS: 50.0000 mg | ORAL_TABLET | Freq: Every day | ORAL | 0 refills | Status: DC

## 2023-01-10 MED ORDER — ALBUTEROL SULFATE (2.5 MG/3ML) 0.083% IN NEBU
2.5000 mg | INHALATION_SOLUTION | Freq: Once | RESPIRATORY_TRACT | Status: AC
  Administered 2023-01-10: 2.5 mg via RESPIRATORY_TRACT

## 2023-01-10 MED ORDER — FLUTICASONE PROPIONATE 50 MCG/ACT NA SUSP: 2.0000 | Freq: Every day | NASAL | 0 refills | Status: DC

## 2023-01-10 MED ORDER — AEROCHAMBER MV MISC: 1 refills | Status: DC

## 2023-01-10 NOTE — ED Triage Notes (Addendum)
Sx x 3 days  Cough Headache frontal facial pain Runny nose Post nasal drip makes her nauseas.  fatigue

## 2023-01-10 NOTE — Discharge Instructions (Signed)
Radiology did not see a pneumonia on your x-ray either.  Your COVID and flu, basic metabolic panel are normal today.  Take two puffs from your albuterol inhaler every 4 hours for 2 days, then every 6 hours for 2 days, then as needed. You can back off if you start to fimprove  sooner. Finish the steroids unless your doctor tells you to stop.  You may take tylenol 1 gram up to 4 times a day as needed for pain and fever. Make sure you drink extra fluids. Return if you get worse, have a fever >100.4, or any other concerns.   Saline nasal irrigation with a NeilMed sinus rinse and distilled water as often as you want, Flonase for the nasal congestion.  Promethazine DM for the cough.  Wait-and-see prescription of Augmentin and azithromycin.  I would start this in a day or 2 if you are not improving or if you get worse.  If the spacer is too expensive at the pharmacy, you can get an AeroChamber Z-Stat off of [employer] for about $10-$15.  Go to www.goodrx.com  or www.costplusdrugs.com to look up your medications. This will give you a list of where you can find your prescriptions at the most affordable prices. Or ask the pharmacist what the cash price is, or if they have any other discount programs available to help make your medication more affordable. This can be less expensive than what you would pay with insurance.

## 2023-01-10 NOTE — ED Provider Notes (Signed)
HPI  SUBJECTIVE:  Tanya [name] is a 61 y.o. female who presents with 3 days of headaches, nasal congestion, rhinorrhea, chest congestion, postnasal drip, cough productive of phlegm, posttussive emesis.  She reports sinus pain and pressure behind her eyes, sore throat, wheezing, dull, achy chest soreness, shortness of breath, dyspnea on exertion.  She also reports body aches, bilateral lower rib soreness from the cough.  No fevers, nausea, vomiting.  She had an episode of diarrhea yesterday, but this has resolved.  No new or different abdominal pain.  She got the COVID vaccines.  She did not get this years flu vaccine.  No known COVID or flu exposure.  No antipyretic in the past 6 hours.  She was on Levaquin earlier this month for COPD exacerbation.  She has been taking Tylenol, using her albuterol once a day, Tessalon, Promethazine DM and Atrovent nasal spray without improvement in her symptoms.  Symptoms are better with going into cool air worse with getting hot. Patient has a past medical history of GERD, hyperlipidemia, lupus on CellCept and Plaquenil, chronic pain, rheumatoid arthritis, asthma/COPD.  She also has a left upper pulmonary nodule that is followed by pulmonology.  No history of chronic kidney disease.  [facility].  Patient was seen here on 12/1 for upper respiratory infection with cough and congestion, found to have a COPD exacerbation and was sent home on Levaquin 500 mg once a day for 7 days, 60 mg of prednisone for 5 days, Atrovent nasal spray, Tessalon Perles, Promethazine DM cough syrup.  She states Atrovent nasal spray made her "sick" and that the [employer] did not work for her.   Past Medical History:  Diagnosis Date   Anxiety    Bladder infection    8/18   Chronic lower back pain    Chronic neck pain    Collagen vascular disease (HCC)    COVID-19    Depression    Diverticulitis    GERD (gastroesophageal reflux disease)    Hyperlipidemia    Lupus     Overactive bladder     Past Surgical History:  Procedure Laterality Date   ABDOMINAL HYSTERECTOMY     COLONOSCOPY     COLONOSCOPY WITH PROPOFOL N/A 07/03/2017   Procedure: COLONOSCOPY WITH PROPOFOL;  Surgeon: Christena Deem, MD;  Location: [facility] ENDOSCOPY;  Service: Endoscopy;  Laterality: N/A;   COLONOSCOPY WITH PROPOFOL N/A 10/21/2017   Procedure: COLONOSCOPY WITH PROPOFOL;  Surgeon: Christena Deem, MD;  Location: [facility] ENDOSCOPY;  Service: Endoscopy;  Laterality: N/A;   ESOPHAGOGASTRODUODENOSCOPY N/A 10/21/2017   Procedure: ESOPHAGOGASTRODUODENOSCOPY (EGD);  Surgeon: Christena Deem, MD;  Location: [facility] ENDOSCOPY;  Service: Endoscopy;  Laterality: N/A;   TONSILLECTOMY      Family History  Problem Relation Age of Onset   Depression Mother    Alcohol abuse Mother    Cancer Mother    Hypertension Mother    Emphysema Mother    Stroke Mother    Glaucoma Father    Heart disease Father    Diabetes Brother    Drug abuse Son     Social History   Tobacco Use   Smoking status: Every Day    Current packs/day: 1.00    Average packs/day: 1 pack/day for 35.0 years (35.0 ttl pk-yrs)    Types: Cigarettes    Passive exposure: Past   Smokeless tobacco: Never   Tobacco comments:    1ppd - 03/01/2021  Vaping Use   Vaping status: Never Used  Substance Use Topics   Alcohol use: No   Drug use: No    No current facility-administered medications for this encounter.  Current Outpatient Medications:    amoxicillin-clavulanate (AUGMENTIN) 875-125 MG tablet, Take 1 tablet by mouth every 12 (twelve) hours., Disp: 14 tablet, Rfl: 0   atorvastatin (LIPITOR) 80 MG tablet, Take 80 mg by mouth every evening., Disp: , Rfl:    azithromycin (ZITHROMAX) 250 MG tablet, Take 1 tablet (250 mg total) by mouth daily. 2 tabs po on day 1, 1 tab po on days 2-5, Disp: 6 tablet, Rfl: 0   baclofen (LIORESAL) 10 MG tablet, Take 1 tablet by mouth at bedtime., Disp: , Rfl:    fluticasone (FLONASE) 50 MCG/ACT  nasal spray, Place 2 sprays into both nostrils daily., Disp: 16 g, Rfl: 0   gabapentin (NEURONTIN) 300 MG capsule, Take 1 capsule (300 mg total) by mouth 3 (three) times daily., Disp: 90 capsule, Rfl: 2   hydroxychloroquine (PLAQUENIL) 200 MG tablet, Take 200 mg by mouth in the morning., Disp: , Rfl:    hydrOXYzine (VISTARIL) 25 MG capsule, TAKE 1-2 CAPSULES BY MOUTH ONCE DAILY ASNEEDED FOR ANXIETY OR SLEEP, Disp: 60 capsule, Rfl: 3   mycophenolate (CELLCEPT) 500 MG tablet, Take by mouth., Disp: , Rfl:    predniSONE (DELTASONE) 50 MG tablet, Take 1 tablet (50 mg total) by mouth daily with breakfast., Disp: 5 tablet, Rfl: 0   Spacer/Aero-Holding Chambers (AEROCHAMBER MV) inhaler, Use as instructed, Disp: 1 each, Rfl: 1   TRINTELLIX 10 MG TABS tablet, Take 10 mg by mouth daily., Disp: , Rfl:    clobetasol cream (TEMOVATE) 0.05 %, Apply 1 Application topically every 3 (three) days., Disp: , Rfl:    naloxone (NARCAN) nasal spray 4 mg/0.1 mL, Place 1 spray into the nose as needed for up to 365 doses (for opioid-induced respiratory depresssion). In case of emergency (overdose), spray once into each nostril. If no response within 3 minutes, repeat application and call 911., Disp: 1 each, Rfl: 0   nicotine (NICODERM CQ - DOSED IN MG/24 HOURS) 14 mg/24hr patch, Place 1 patch (14 mg total) onto the skin daily., Disp: 28 patch, Rfl: 1   oxyCODONE (OXY IR/ROXICODONE) 5 MG immediate release tablet, Take 1 tablet (5 mg total) by mouth 2 (two) times daily as needed for severe pain (pain score 7-10). Must last 30 days, Disp: 60 tablet, Rfl: 0   oxyCODONE (OXY IR/ROXICODONE) 5 MG immediate release tablet, Take 1 tablet (5 mg total) by mouth 2 (two) times daily as needed for severe pain (pain score 7-10). Must last 30 days, Disp: 60 tablet, Rfl: 0   [START ON 01/26/2023] oxyCODONE (OXY IR/ROXICODONE) 5 MG immediate release tablet, Take 1 tablet (5 mg total) by mouth 2 (two) times daily as needed for severe pain (pain score  7-10). Must last 30 days, Disp: 60 tablet, Rfl: 0   VENTOLIN HFA 108 (90 Base) MCG/ACT inhaler, INHALE 2 PUFFS INTO THE LUNGS EVERY 6 HOURS AS NEEDED FOR WHEEZING OR SHORTNESS OF BREATH, Disp: 18 g, Rfl: 2  Facility-Administered Medications Ordered in Other Encounters:    albuterol (PROVENTIL) (2.5 MG/3ML) 0.083% nebulizer solution 2.5 mg, 2.5 mg, Nebulization, Once, Salena Saner, MD  Allergies  Allergen Reactions   Tessalon [Benzonatate] Anaphylaxis   Pregabalin Other (See Comments)    unknown   Trazodone Other (See Comments)    Agitation   Bupropion Anxiety   Methocarbamol Other (See Comments)    Agitation and stiffness  Tramadol Other (See Comments)    Nausea And Vomiting Reports she feels "high"     ROS  As noted in HPI.   Physical Exam  BP 139/87 (BP Location: Right Arm)   Pulse 92   Temp 98.4 F (36.9 C) (Oral)   Resp (!) 24   SpO2 94%   Constitutional: Well developed, well nourished, no acute distress Eyes:  EOMI, conjunctiva normal bilaterally HENT: Normocephalic, atraumatic,mucus membranes moist nasal congestion.  Swollen, erythematous turbinates.  Positive frontal, maxillary tenderness.  No postnasal drip. Neck: No cervical lymphadenopathy Respiratory: Normal inspiratory effort , diffuse rhonchi on the right side, rhonchi left base.  No chest wall tenderness Cardiovascular: Normal rate, regular rhythm, no murmurs rubs or gallops GI: nondistended skin: No rash, skin intact Musculoskeletal: no deformities Neurologic: Alert & oriented x 3, no focal neuro deficits Psychiatric: Speech and behavior appropriate   ED Course   Medications  albuterol (PROVENTIL) (2.5 MG/3ML) 0.083% nebulizer solution 2.5 mg (2.5 mg Nebulization Given 01/10/23 1657)  ipratropium-albuterol (DUONEB) 0.5-2.5 (3) MG/3ML nebulizer solution 3 mL (3 mLs Nebulization Given 01/10/23 1657)    Orders Placed This Encounter  Procedures   Resp Panel by RT-PCR (Flu A&B, Covid)  Anterior Nasal Swab    Standing Status:   Standing    Number of Occurrences:   1    Patient immune status:   Immunocompromised   DG Chest 2 View    Standing Status:   Standing    Number of Occurrences:   1    Reason for Exam (SYMPTOM  OR DIAGNOSIS REQUIRED):   cough, rales right side, left lower lobe rule out pneumonia   Basic metabolic panel    Standing Status:   Standing    Number of Occurrences:   1    Results for orders placed or performed during the hospital encounter of 01/10/23 (from the past 24 hours)  Resp Panel by RT-PCR (Flu A&B, Covid) Anterior Nasal Swab     Status: None   Collection Time: 01/10/23  5:00 PM   Specimen: Anterior Nasal Swab  Result Value Ref Range   SARS Coronavirus 2 by RT PCR NEGATIVE NEGATIVE   Influenza A by PCR NEGATIVE NEGATIVE   Influenza B by PCR NEGATIVE NEGATIVE  Basic metabolic panel     Status: None   Collection Time: 01/10/23  5:12 PM  Result Value Ref Range   Sodium 138 135 - 145 mmol/L   Potassium 3.9 3.5 - 5.1 mmol/L   Chloride 103 98 - 111 mmol/L   CO2 26 22 - 32 mmol/L   Glucose, Bld 90 70 - 99 mg/dL   BUN 16 6 - 20 mg/dL   Creatinine, Ser 1.61 0.44 - 1.00 mg/dL   Calcium 9.4 8.9 - 09.6 mg/dL   GFR, Estimated >04 >54 mL/min   Anion gap 9 5 - 15   DG Chest 2 View Result Date: 01/10/2023 CLINICAL DATA:  Cough and rales on right, left lower lobe. Evaluate for pneumonia. EXAM: CHEST - 2 VIEW COMPARISON:  Chest radiographs 06/30/2021 and 03/14/2021; PET-CT 09/03/2022 and 03/09/2021 FINDINGS: Cardiac silhouette and mediastinal contours are within limits. There is again mild hyperinflation. Increased lucencies within upper lungs again suggesting centrilobular emphysematous changes. No pleural effusion pneumothorax. Left upper pole pulmonary nodule is again noted. 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 No acute skeletal abnormality. IMPRESSION: 1. No acute  cardiopulmonary process. 2. Left upper pole pulmonary nodule is  again noted. 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 Electronically Signed   By: Neita Garnet M.D.   On: 01/10/2023 18:04    ED Clinical Impression  1. COPD exacerbation (HCC)   2. Acute cough   3. Acute non-recurrent pansinusitis      ED Assessment/Plan     Previous records reviewed.  As noted in HPI.  Outside medical records reviewed.  Additional medical history obtained.  Patient has diffuse rhonchi in the right lung and left lower lobe.  Will give DuoNeb 5/0.5.  Will check a basic metabolic panel given the fact that she has a history of lupus although was able to find a CMP with a GFR of 99 in September.  Checking chest x-ray, COVID/flu.  Reviewed imaging independently.  Pulmonary nodule left upper lobe.  No consolidation, pleural effusion.  No obvious infiltrate.  Formal radiology read pending.   Reviewed radiology report.  Left upper pulmonary nodule, unchanged.  Emphysematous changes.  See radiology report for details.  COVID, flu negative.  BMP normal.  On reevaluation post nebulizer treatment, patient states she feels significantly better.  Heart rate 95, oxygen saturation 97%.  Improved air movement.  Wheezing isolated to the left lower lobe.    Will treat this as a sinusitis/COPD exacerbation with regularly scheduled albuterol inhaler with a spacer for 4 days, then as needed thereafter, prednisone 50 mg for 5 days, saline nasal irrigation, Flonase, Promethazine DM.  Wait-and-see prescription of Augmentin and azithromycin to cover COPD exacerbation/pneumonia.  This will also cover a sinusitis.  She will follow-up with her PCP if not getting better, especially if on antibiotics.  Strict ER return precautions given.  Reviewed radiology report.  See radiology report for full details.  Discussed labs, imaging, MDM, treatment plan, and  plan for follow-up with patient. Discussed sn/sx that should prompt return to the ED. patient agrees with plan.   Meds ordered this encounter  Medications   albuterol (PROVENTIL) (2.5 MG/3ML) 0.083% nebulizer solution 2.5 mg   ipratropium-albuterol (DUONEB) 0.5-2.5 (3) MG/3ML nebulizer solution 3 mL   amoxicillin-clavulanate (AUGMENTIN) 875-125 MG tablet    Sig: Take 1 tablet by mouth every 12 (twelve) hours.    Dispense:  14 tablet    Refill:  0   Spacer/Aero-Holding Chambers (AEROCHAMBER MV) inhaler    Sig: Use as instructed    Dispense:  1 each    Refill:  1   predniSONE (DELTASONE) 50 MG tablet    Sig: Take 1 tablet (50 mg total) by mouth daily with breakfast.    Dispense:  5 tablet    Refill:  0   fluticasone (FLONASE) 50 MCG/ACT nasal spray    Sig: Place 2 sprays into both nostrils daily.    Dispense:  16 g    Refill:  0   azithromycin (ZITHROMAX) 250 MG tablet    Sig: Take 1 tablet (250 mg total) by mouth daily. 2 tabs po on day 1, 1 tab po on days 2-5    Dispense:  6 tablet    Refill:  0      *This clinic note was created using Scientist, clinical (histocompatibility and immunogenetics). Therefore, there may be occasional mistakes despite careful proofreading.  ?    Domenick Gong, MD 01/11/23 [phone]

## 2023-01-23 DIAGNOSIS — Z7960 Long term (current) use of unspecified immunomodulators and immunosuppressants: Secondary | ICD-10-CM | POA: Insufficient documentation

## 2023-02-18 NOTE — Progress Notes (Unsigned)
PROVIDER NOTE: Information contained herein reflects review and annotations entered in association with encounter. Interpretation of such information and data should be left to medically-trained personnel. Information provided to patient can be located elsewhere in the medical record under "Patient Instructions". Document created using STT-dictation technology, any transcriptional errors that may result from process are unintentional.    Patient: Tanya [name]  Service Category: E/M  Provider: Oswaldo Done, MD  DOB: [birth year]  DOS: 02/19/2023  Referring Provider: Mick Sell, MD  MRN: [id number]  Specialty: Interventional Pain Management  PCP: Mick Sell, MD  Type: Established Patient  Setting: Ambulatory outpatient    Location: Office  Delivery: Face-to-face     HPI  [name], a 63 y.o. year old female, is here today because of her No primary diagnosis found.. Ms. Slager primary complain today is No chief complaint on file.  Pertinent problems: Ms. Reznick has Chronic upper back pain (2ry area of Pain) (Bilateral) (R>L); Chronic low back pain (3ry area of Pain) (Bilateral) (R>L) w/o sciatica; Chronic neck pain (1ry area of Pain) (Bilateral) (R>L); Fibromyalgia; Osteoarthritis; Chronic pain syndrome; Chronic upper extremity pain (Right); Chronic cervical radicular pain (Right); Chronic shoulder pain (Bilateral) (R>L); Chronic musculoskeletal pain; Cervical spondylosis; Cervical facet syndrome (Bilateral) (R>L); Shoulder radicular pain (Bilateral) (R>L); Acute postoperative pain; Spondylosis without myelopathy or radiculopathy, cervical region; Cervicalgia; Occipital headache (Left); Neurogenic pain; Cervico-occipital neuralgia (Left); Chronic elbow pain (Left); Osteoarthritis of AC (acromioclavicular) joint (Right); Chronic acromioclavicular joint pain (Right); Chronic shoulder pain (Right); Epicondylitis elbow, medial (Left); Osteoarthritis of shoulders  (Bilateral) (R>L); Chronic shoulder pain (Left); Osteoarthritis of shoulder (Left); DDD (degenerative disc disease), cervical; Arthropathy of cervical facet joint (Bilateral); Cervical spine pain; Abnormal MRI, cervical spine (09/20/2015); Cervical central spinal stenosis (Multilevel); 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 Pain Assessment: Severity of   is reported as a  /10. Location:    / . Onset:  . Quality:  . Timing:  . Modifying factor(s):  Marland Kitchen Vitals:  vitals were not taken for this visit.  BMI: Estimated body mass index is 26.22 kg/m as calculated from the following:   Height as of 12/22/22: 5\' 3"  (1.6 m).   Weight as of 12/22/22: 148 lb (67.1 kg). Last encounter: 11/20/2022. Last procedure: 07/18/2022.  Reason for encounter:  *** . ***  Discussed the use of AI scribe software for clinical note transcription with the patient, who gave verbal consent to proceed.  History of Present Illness           Pharmacotherapy Assessment  Analgesic: Oxycodone IR 5 mg, 1 tab p.o. twice daily (60/month) (10 mg/day of oxycodone) MME/day: 15 mg/day.   Monitoring: [place] PMP: PDMP reviewed during this encounter.       Pharmacotherapy: No side-effects or adverse reactions reported. Compliance: No problems identified. Effectiveness: Clinically acceptable.  No notes on file  No results found for: "CBDTHCR" No results found for: "D8THCCBX" No results found for: "D9THCCBX"  UDS:  Summary  Date Value Ref Range Status  05/01/2022 Note  Final    Comment:    ==================================================================== [employer] Select 13 (MW) ==================================================================== Specimen Alert Note: Urinary creatinine is  low; ability to detect some drugs may be compromised. Interpret results with caution. (Creatinine) ==================================================================== Test  Result       Flag       Units  Drug Absent but Declared for Prescription Verification   Oxycodone                      Not Detected UNEXPECTED ng/mg creat ==================================================================== Test                      Result    Flag   Units      Ref Range   Creatinine              11        LL     mg/dL      >=16 ==================================================================== Declared Medications:  The flagging and interpretation on this report are based on the  following declared medications.  Unexpected results may arise from  inaccuracies in the declared medications.   **Note: The testing scope of this panel includes these medications:   Oxycodone (Roxicodone)   **Note: The testing scope of this panel does not include the  following reported medications:   Albuterol (Ventolin HFA)  Atorvastatin (Lipitor)  Clobetasol (Temovate)  Duloxetine (Cymbalta)  Folic Acid  Gabapentin (Neurontin)  Hydroxychloroquine (Plaquenil)  Hydroxyzine (Vistaril)  Methotrexate  Naloxone (Narcan)  Nicotine ==================================================================== For clinical consultation, please call [phone]. ====================================================================       ROS  Constitutional: Denies any fever or chills Gastrointestinal: No reported hemesis, hematochezia, vomiting, or acute GI distress Musculoskeletal: Denies any acute onset joint swelling, redness, loss of ROM, or weakness Neurological: No reported episodes of acute onset apraxia, aphasia, dysarthria, agnosia, amnesia, paralysis, loss of coordination, or loss of consciousness  Medication Review  AeroChamber MV, albuterol, amoxicillin-clavulanate, atorvastatin,  azithromycin, baclofen, clobetasol cream, fluticasone, gabapentin, hydrOXYzine, hydroxychloroquine, mycophenolate, naloxone, nicotine, oxyCODONE, predniSONE, and vortioxetine HBr  History Review  Allergy: Ms. Ordaz is allergic to tessalon [benzonatate], pregabalin, trazodone, bupropion, methocarbamol, and tramadol. Drug: Ms. Verdejo  reports no history of drug use. Alcohol:  reports no history of alcohol use. Tobacco:  reports that she has been smoking cigarettes. She has a 35 pack-year smoking history. She has been exposed to tobacco smoke. She has never used smokeless tobacco. Social: Ms. Labrum  reports that she has been smoking cigarettes. She has a 35 pack-year smoking history. She has been exposed to tobacco smoke. She has never used smokeless tobacco. She reports that she does not drink alcohol and does not use drugs. Medical:  has a past medical history of Anxiety, Bladder infection, Chronic lower back pain, Chronic neck pain, Collagen vascular disease (HCC), COVID-19, Depression, Diverticulitis, GERD (gastroesophageal reflux disease), Hyperlipidemia, Lupus, and Overactive bladder. Surgical: Ms. Valenti  has a past surgical history that includes Abdominal hysterectomy; Colonoscopy; Colonoscopy with propofol (N/A, 07/03/2017); Tonsillectomy; Esophagogastroduodenoscopy (N/A, 10/21/2017); and Colonoscopy with propofol (N/A, 10/21/2017). Family: family history includes Alcohol abuse in her mother; Cancer in her mother; Depression in her mother; Diabetes in her brother; Drug abuse in her son; Emphysema in her mother; Glaucoma in her father; Heart disease in her father; Hypertension in her mother; Stroke in her mother.  Laboratory Chemistry Profile   Renal Lab Results  Component Value Date   BUN 16 01/10/2023   CREATININE 0.79 01/10/2023   BCR 15 09/03/2017   GFRAA >60 06/09/2019   GFRNONAA >60 01/10/2023    Hepatic Lab Results  Component Value Date   AST 26 06/09/2019   ALT  20 06/09/2019   ALBUMIN 4.1 06/09/2019  ALKPHOS 29 (L) 06/09/2019   LIPASE 35 06/09/2019    Electrolytes Lab Results  Component Value Date   NA 138 01/10/2023   K 3.9 01/10/2023   CL 103 01/10/2023   CALCIUM 9.4 01/10/2023   MG 1.9 09/03/2017    Bone Lab Results  Component Value Date   25OHVITD1 25 (L) 09/03/2017   25OHVITD2 2.9 09/03/2017   25OHVITD3 22 09/03/2017    Inflammation (CRP: Acute Phase) (ESR: Chronic Phase) Lab Results  Component Value Date   CRP 2 09/03/2017   ESRSEDRATE 21 09/03/2017         Note: Above Lab results reviewed.  Recent Imaging Review  DG Chest 2 View CLINICAL DATA:  Cough and rales on right, left lower lobe. Evaluate for pneumonia.  EXAM: CHEST - 2 VIEW  COMPARISON:  Chest radiographs 06/30/2021 and 03/14/2021; PET-CT 09/03/2022 and 03/09/2021  FINDINGS: Cardiac silhouette and mediastinal contours are within limits. There is again mild hyperinflation. Increased lucencies within upper lungs again suggesting centrilobular emphysematous changes. No pleural effusion pneumothorax. Left upper pole pulmonary nodule is again noted. 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  No acute skeletal abnormality.  IMPRESSION: 1. No acute cardiopulmonary process. 2. Left upper pole pulmonary nodule is again noted. 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  Electronically Signed   By: Neita Garnet M.D.   On: 01/10/2023 18:04 Note: Reviewed        Physical Exam  General appearance: Well nourished, well developed, and well hydrated. In no apparent acute distress Mental status: Alert, oriented x 3 (person, place, & time)       Respiratory: No evidence of acute respiratory distress Eyes: PERLA Vitals: There were no vitals taken for this visit. BMI: Estimated body mass index is  26.22 kg/m as calculated from the following:   Height as of 12/22/22: 5\' 3"  (1.6 m).   Weight as of 12/22/22: 148 lb (67.1 kg). Ideal: Patient weight not recorded  Assessment   Diagnosis Status  No diagnosis found. Controlled Controlled Controlled   Updated Problems: No problems updated.  Plan of Care  Problem-specific:  Assessment and Plan            Ms. DOROTHA HIRSCHI has a current medication list which includes the following long-term medication(s): fluticasone, gabapentin, oxycodone, oxycodone, oxycodone, and ventolin hfa.  Pharmacotherapy (Medications Ordered): No orders of the defined types were placed in this encounter.  Orders:  No orders of the defined types were placed in this encounter.  Follow-up plan:   No follow-ups on file.      Interventional Therapies  Risk Factors  Considerations:   Antiplatelet therapy: Plaquenil (Stop: 11 days  Restart: next day)  Risk of prolonged QT Syndrome  Tobacco abuse  Anxiety  Depression \ Insomnia  SLE  Urinary incontinence     Planned  Pending:   Therapeutic left cervical facet RFA #4    Under consideration:   Therapeutic bilateral cervical facet MB RFA #4 (starting with the left side)   Completed:   Therapeutic left IA shoulder joint inj. x1 (12/30/2019) (100/100/100 x 5 days/0)  Palliative right IA shoulder joint inj. x2 (10/06/2018) (100/100/50/50)  Palliative right AC joint inj. x3 (04/02/2018) (N/A)  Palliative left medial epicondyle elbow inj. x1 (04/02/2018) (N/A)  Diagnostic left GONB x1 (12/25/2017) (N/A)  Palliative right CESI x2 (02/12/2016) (N/A) Therapeutic left C7-T1 cervical ESI x1 (03/18/2016) (  100/50/0/0)  Palliative bilateral cervical facet MBB x2 (06/26/2016)  Palliative right cervical facet RFA x3 (04/13/2020) (100/100/75/75)  Palliative left cervical facet RFA x3 (03/14/2020) (100/100/75/75)  Diagnostic/therapeutic midline C7-T1 TPI/MNB x1 (10/08/2021) (100/100/100/100)     Therapeutic  Palliative (PRN) options:   Palliative cervical facet MBB  Palliative cervical facet RFA     Pharmacotherapy  Nonopioids transfer 12/22/2019: Baclofen and Neurontin       Recent Visits Date Type Provider Dept  11/20/22 Office Visit Delano Metz, MD [facility]  Showing recent visits within past 90 days and meeting all other requirements Future Appointments Date Type Provider Dept  02/19/23 Appointment Delano Metz, MD [facility]  Showing future appointments within next 90 days and meeting all other requirements  I discussed the assessment and treatment plan with the patient. The patient was provided an opportunity to ask questions and all were answered. The patient agreed with the plan and demonstrated an understanding of the instructions.  Patient advised to call back or seek an in-person evaluation if the symptoms or condition worsens.  Duration of encounter: *** minutes.  Total time on encounter, as per AMA guidelines included both the face-to-face and non-face-to-face time personally spent by the physician and/or other qualified health care professional(s) on the day of the encounter (includes time in activities that require the physician or other qualified health care professional and does not include time in activities normally performed by clinical staff). Physician's time may include the following activities when performed: Preparing to see the patient (e.g., pre-charting review of records, searching for previously ordered imaging, lab work, and nerve conduction tests) Review of prior analgesic pharmacotherapies. Reviewing PMP Interpreting ordered tests (e.g., lab work, imaging, nerve conduction tests) Performing post-procedure evaluations, including interpretation of diagnostic procedures Obtaining and/or reviewing separately obtained history Performing a medically appropriate examination and/or evaluation Counseling and  educating the patient/family/caregiver Ordering medications, tests, or procedures Referring and communicating with other health care professionals (when not separately reported) Documenting clinical information in the electronic or other health record Independently interpreting results (not separately reported) and communicating results to the patient/ family/caregiver Care coordination (not separately reported)  Note by: Oswaldo Done, MD Date: 02/19/2023; Time: 8:15 AM

## 2023-02-19 ENCOUNTER — Encounter: Payer: Self-pay | Admitting: Pain Medicine

## 2023-02-19 ENCOUNTER — Ambulatory Visit: Payer: Medicaid Other | Attending: Pain Medicine | Admitting: Pain Medicine

## 2023-02-19 VITALS — BP 148/83 | HR 85 | Temp 97.7°F | Resp 16 | Ht 63.0 in | Wt 150.0 lb

## 2023-02-19 DIAGNOSIS — M25511 Pain in right shoulder: Secondary | ICD-10-CM | POA: Insufficient documentation

## 2023-02-19 DIAGNOSIS — G894 Chronic pain syndrome: Secondary | ICD-10-CM | POA: Diagnosis present

## 2023-02-19 DIAGNOSIS — M7918 Myalgia, other site: Secondary | ICD-10-CM | POA: Diagnosis present

## 2023-02-19 DIAGNOSIS — M2550 Pain in unspecified joint: Secondary | ICD-10-CM | POA: Diagnosis present

## 2023-02-19 DIAGNOSIS — G8929 Other chronic pain: Secondary | ICD-10-CM | POA: Diagnosis present

## 2023-02-19 DIAGNOSIS — M47812 Spondylosis without myelopathy or radiculopathy, cervical region: Secondary | ICD-10-CM | POA: Diagnosis present

## 2023-02-19 DIAGNOSIS — M549 Dorsalgia, unspecified: Secondary | ICD-10-CM | POA: Diagnosis present

## 2023-02-19 DIAGNOSIS — Z79899 Other long term (current) drug therapy: Secondary | ICD-10-CM | POA: Diagnosis present

## 2023-02-19 DIAGNOSIS — M545 Low back pain, unspecified: Secondary | ICD-10-CM | POA: Insufficient documentation

## 2023-02-19 DIAGNOSIS — M25512 Pain in left shoulder: Secondary | ICD-10-CM | POA: Insufficient documentation

## 2023-02-19 DIAGNOSIS — M542 Cervicalgia: Secondary | ICD-10-CM | POA: Insufficient documentation

## 2023-02-19 DIAGNOSIS — Z79891 Long term (current) use of opiate analgesic: Secondary | ICD-10-CM | POA: Diagnosis present

## 2023-02-19 MED ORDER — OXYCODONE HCL 5 MG PO TABS: 5.0000 mg | ORAL_TABLET | Freq: Two times a day (BID) | ORAL | 0 refills | Status: DC | PRN

## 2023-02-19 MED ORDER — OXYCODONE HCL 5 MG PO TABS
5.0000 mg | ORAL_TABLET | Freq: Two times a day (BID) | ORAL | 0 refills | Status: DC | PRN
Start: 2023-02-25 — End: 2023-05-18

## 2023-02-19 MED ORDER — OXYCODONE HCL 5 MG PO TABS
5.0000 mg | ORAL_TABLET | Freq: Two times a day (BID) | ORAL | 0 refills | Status: DC | PRN
Start: 2023-03-27 — End: 2023-05-18

## 2023-02-19 NOTE — Progress Notes (Signed)
Nursing Pain Medication Assessment:  Safety precautions to be maintained throughout the outpatient stay will include: orient to surroundings, keep bed in low position, maintain call bell within reach at all times, provide assistance with transfer out of bed and ambulation.  Medication Inspection Compliance: Pill count conducted under aseptic conditions, in front of the patient. Neither the pills nor the bottle was removed from the patient's sight at any time. Once count was completed pills were immediately returned to the patient in their original bottle.  Medication: Oxycodone IR Pill/Patch Count:  0 of 60 pills remain Pill/Patch Appearance: Markings consistent with prescribed medication Bottle Appearance: Standard pharmacy container. Clearly labeled. Filled Date: 01 / 06 / 2025 Last Medication intake:  Today

## 2023-02-19 NOTE — Patient Instructions (Addendum)
______________________________________________________________________    OTC Supplements:   The following is a list of over-the-counter (OTC) supplements that have been found to have [facility] [employer]) studies suggesting that they may be of some benefits when used in moderation in some chronic pain-related conditions.  NOTE:  Always consult with your primary care provider and/or pharmacist before taking any OTC medications to make sure they will not interact with your current medications. Always use manufacturer's recommended dosage.  Supplement Possible benefit May be of benefit in treatment of   Turmeric/curcumin anti-inflammatory Joint and muscle aches and pain.  Glucosamine/chondroitin (triple strength) may slow loss of articular cartilage Joint pain.  Vitamin D-3* may suppress release of chemicals associated with inflammation. Increases tolerance to pain. Joint and muscle aches and pain.   Moringa(+) anti-inflammatory with mild analgesic effects Joint and muscle aches and pain.  Melatonin(+) Helps reset sleep cycle. Insomnia.  Vitamin B-12* may help keep nerves and blood cells healthy as well as maintaining function of nervous system Nerve pain (Burning pain)  Alpha-Lipoic-Acid (ALA)* antioxidant that may help with nerve health, pain, and blocking the activation of some inflammatory chemicals Diabetic neuropathy and metabolic syndrome  superoxide dismutase (SOD)** Currently being reviewed.   Tiger Balm Currently being reviewed.   hydrolyzed collagen peptides* Currently being reviewed.  Collagen supplementation may increases bone strength, density, and mass; may improve joint stiffness/mobility, and functionality; and may reduce joint pain. Possible chondroprotective effects. May help with protection of joint health.   Methylsulfonylmethane (MSM)* Currently being reviewed.   CBD(+) Currently being reviewed.   Delta-8 THC(+) Currently being reviewed.   *  Generally  Recognized As Safe (GRAS) approved substance.-FDA (FindDrives.pl) ** "Possibly Safe", but not considered Generally Recognized As Safe (Not GRAS) by the [place] (FDA) as a food additive. (+) Not considered Generally Recognized As Safe (Not GRAS) by the [employer] (FDA) as a food additive.  ______________________________________________________________________      ______________________________________________________________________    Opioid Pain Medication Update  To: All patients taking opioid pain medications. (I.e.: hydrocodone, hydromorphone, oxycodone, oxymorphone, morphine, codeine, methadone, tapentadol, tramadol, buprenorphine, fentanyl, etc.)  Re: Updated review of side effects and adverse reactions of opioid analgesics, as well as new information about long term effects of this class of medications.  Direct risks of long-term opioid therapy are not limited to opioid addiction and overdose. Potential medical risks include serious fractures, breathing problems during sleep, hyperalgesia, immunosuppression, chronic constipation, bowel obstruction, myocardial infarction, and tooth decay secondary to xerostomia.  Unpredictable adverse effects that can occur even if you take your medication correctly: Cognitive impairment, respiratory depression, and death. Most people think that if they take their medication "correctly", and "as instructed", that they will be safe. Nothing could be farther from the truth. In reality, a significant amount of recorded deaths associated with the use of opioids has occurred in individuals that had taken the medication for a long time, and were taking their medication correctly. The following are examples of how this can happen: Patient taking his/her medication for a long time, as instructed, without any side effects, is given a  certain antibiotic or another unrelated medication, which in turn triggers a "Drug-to-drug interaction" leading to disorientation, cognitive impairment, impaired reflexes, respiratory depression or an untoward event leading to serious bodily harm or injury, including death.  Patient taking his/her medication for a long time, as instructed, without any side effects, develops an acute impairment of liver and/or kidney function.  This will lead to a rapid inability of the body to breakdown and eliminate their pain medication, which will result in effects similar to an "overdose", but with the same medicine and dose that they had always taken. This again may lead to disorientation, cognitive impairment, impaired reflexes, respiratory depression or an untoward event leading to serious bodily harm or injury, including death.  A similar problem will occur with patients as they grow older and their liver and kidney function begins to decrease as part of the aging process.  Background information: Historically, the original case for using long-term opioid therapy to treat chronic noncancer pain was based on safety assumptions that subsequent experience has called into question. In 1996, the American Pain Society and the American Academy of Pain Medicine issued a consensus statement supporting long-term opioid therapy. This statement acknowledged the dangers of opioid prescribing but concluded that the risk for addiction was low; respiratory depression induced by opioids was short-lived, occurred mainly in opioid-naive patients, and was antagonized by pain; tolerance was not a common problem; and efforts to control diversion should not constrain opioid prescribing. This has now proven to be wrong. Experience regarding the risks for opioid addiction, misuse, and overdose in community practice has failed to support these assumptions.  According to the Centers for Disease Control and Prevention, fatal overdoses involving  opioid analgesics have increased sharply over the past decade. Currently, more than 96,700 people die from drug overdoses every year. Opioids are a factor in 7 out of every 10 overdose deaths. Deaths from drug overdose have surpassed motor vehicle accidents as the leading cause of death for individuals between the ages of 73 and 92.  Clinical data suggest that neuroendocrine dysfunction may be very common in both men and women, potentially causing hypogonadism, erectile dysfunction, infertility, decreased libido, osteoporosis, and depression. Recent studies linked higher opioid dose to increased opioid-related mortality. Controlled observational studies reported that long-term opioid therapy may be associated with increased risk for cardiovascular events. Subsequent meta-analysis concluded that the safety of long-term opioid therapy in elderly patients has not been proven.   Side Effects and adverse reactions: Common side effects: Drowsiness (sedation). Dizziness. Nausea and vomiting. Constipation. Physical dependence -- Dependence often manifests with withdrawal symptoms when opioids are discontinued or decreased. Tolerance -- As you take repeated doses of opioids, you require increased medication to experience the same effect of pain relief. Respiratory depression -- This can occur in healthy people, especially with higher doses. However, people with COPD, asthma or other lung conditions may be even more susceptible to fatal respiratory impairment.  Uncommon side effects: An increased sensitivity to feeling pain and extreme response to pain (hyperalgesia). Chronic use of opioids can lead to this. Delayed gastric emptying (the process by which the contents of your stomach are moved into your small intestine). Muscle rigidity. Immune system and hormonal dysfunction. Quick, involuntary muscle jerks (myoclonus). Arrhythmia. Itchy skin (pruritus). Dry mouth (xerostomia).  Long-term side  effects: Chronic constipation. Sleep-disordered breathing (SDB). Increased risk of bone fractures. Hypothalamic-pituitary-adrenal dysregulation. Increased risk of overdose.  RISKS: Respiratory depression and death: Opioids increase the risk of respiratory depression and death.  Drug-to-drug interactions: Opioids are relatively contraindicated in combination with benzodiazepines, sleep inducers, and other central nervous system depressants. Other classes of medications (i.e.: certain antibiotics and even over-the-counter medications) may also trigger or induce respiratory depression in some patients.  Medical conditions: Patients with pre-existing respiratory problems are at higher risk of respiratory failure and/or depression when in combination with opioid  analgesics. Opioids are relatively contraindicated in some medical conditions such as central sleep apnea.   Fractures and Falls:  Opioids increase the risk and incidence of falls. This is of particular importance in elderly patients.  Endocrine System:  Long-term administration is associated with endocrine abnormalities (endocrinopathies). (Also known as Opioid-induced Endocrinopathy) Influences on both the hypothalamic-pituitary-adrenal axis?and the hypothalamic-pituitary-gonadal axis have been demonstrated with consequent hypogonadism and adrenal insufficiency in both sexes. Hypogonadism and decreased levels of dehydroepiandrosterone sulfate have been reported in men and women. Endocrine effects include: Amenorrhoea in women (abnormal absence of menstruation) Reduced libido in both sexes Decreased sexual function Erectile dysfunction in men Hypogonadisms (decreased testicular function with shrinkage of testicles) Infertility Depression and fatigue Loss of muscle mass Anxiety Depression Immune suppression Hyperalgesia Weight gain Anemia Osteoporosis Patients (particularly women of childbearing age) should avoid  opioids. There is insufficient evidence to recommend routine monitoring of asymptomatic patients taking opioids in the long-term for hormonal deficiencies.  Immune System: Human studies have demonstrated that opioids have an immunomodulating effect. These effects are mediated via opioid receptors both on immune effector cells and in the central nervous system. Opioids have been demonstrated to have adverse effects on antimicrobial response and anti-tumour surveillance. Buprenorphine has been demonstrated to have no impact on immune function.  Opioid Induced Hyperalgesia: Human studies have demonstrated that prolonged use of opioids can lead to a state of abnormal pain sensitivity, sometimes called opioid induced hyperalgesia (OIH). Opioid induced hyperalgesia is not usually seen in the absence of tolerance to opioid analgesia. Clinically, hyperalgesia may be diagnosed if the patient on long-term opioid therapy presents with increased pain. This might be qualitatively and anatomically distinct from pain related to disease progression or to breakthrough pain resulting from development of opioid tolerance. Pain associated with hyperalgesia tends to be more diffuse than the pre-existing pain and less defined in quality. Management of opioid induced hyperalgesia requires opioid dose reduction.  Cancer: Chronic opioid therapy has been associated with an increased risk of cancer among noncancer patients with chronic pain. This association was more evident in chronic strong opioid users. Chronic opioid consumption causes significant pathological changes in the small intestine and colon. Epidemiological studies have found that there is a link between opium dependence and initiation of gastrointestinal cancers. Cancer is the second leading cause of death after cardiovascular disease. Chronic use of opioids can cause multiple conditions such as GERD, immunosuppression and renal damage as well as carcinogenic  effects, which are associated with the incidence of cancers.   Mortality: Long-term opioid use has been associated with increased mortality among patients with chronic non-cancer pain (CNCP).  Prescription of long-acting opioids for chronic noncancer pain was associated with a significantly increased risk of all-cause mortality, including deaths from causes other than overdose.  Reference: Von Korff M, Kolodny A, Deyo RA, Chou R. Long-term opioid therapy reconsidered. Ann Intern Med. 2011 Sep 6;155(5):325-8. doi: 10.7326/[id number]. PMID: 16109604; PMCID: [id number]. Randon Goldsmith, Hayward RA, Dunn KM, [place] KP. Risk of adverse events in patients prescribed long-term opioids: A cohort study in the [place] Clinical Practice Research Datalink. Eur J Pain. 2019 May;23(5):908-922. doi: 10.1002/ejp.1357. Epub 2019 Jan 31. PMID: 78295621. Colameco S, Coren JS, Ciervo CA. Continuous opioid treatment for chronic noncancer pain: a time for moderation in prescribing. Postgrad Med. 2009 Jul;121(4):61-6. doi: 10.3810/pgm.2009.07.2032. PMID: 30865784. William Hamburger RN, Prue SD, Blazina I, Cristopher Peru, Bougatsos C, Deyo RA. The effectiveness and risks of long-term opioid therapy for chronic  pain: a systematic review for a [employer] of [employer] Pathways to [employer]. Ann Intern Med. 2015 Feb 17;162(4):276-86. doi: 10.7326/M14-2559. PMID: 16109604. Caryl Bis [facility], Makuc DM. NCHS Data Brief No. 22. [place]: Centers for Disease Control and Prevention; 2009. Sep, Increase in Fatal Poisonings Involving Opioid Analgesics in the [place], 1999-2006. Song IA, Choi HR, Oh TK. Long-term opioid use and mortality in patients with chronic non-cancer pain: Ten-year follow-up study in [place] from 2010 through 2019. EClinicalMedicine. 2022 Jul 18;51:101558. doi: 10.1016/j.eclinm.2022.540981. PMID: 19147829; PMCID: [id number]. Huser, W., Schubert,  T., Vogelmann, T. et al. All-cause mortality in patients with long-term opioid therapy compared with non-opioid analgesics for chronic non-cancer pain: a database study. BMC Med 18, 162 (2020). http://lester.info/ Rashidian H, Karie Kirks, Malekzadeh R, Haghdoost AA. An Ecological Study of the Association between Opiate Use and Incidence of Cancers. Addict Health. 2016 Fall;8(4):252-260. PMID: 84696295; PMCID: [id number].  Our Goal: Our goal is to control your pain with means other than the use of opioid pain medications.  Our Recommendation: Talk to your physician about coming off of these medications. We can assist you with the tapering down and stopping these medicines. Based on the new information, even if you cannot completely stop the medication, a decrease in the dose may be associated with a lesser risk. Ask for other means of controlling the pain. Decrease or eliminate those factors that significantly contribute to your pain such as smoking, obesity, and a diet heavily tilted towards "inflammatory" nutrients.  Last Updated: 07/29/2022   ______________________________________________________________________       ______________________________________________________________________    National Pain Medication Shortage  The [place] is experiencing worsening drug shortages. These have had a negative widespread effect on patient care and treatment. Not expected to improve any time soon. Predicted to last past 2029.   Drug shortage list (generic names) Oxycodone IR Oxycodone/APAP Oxymorphone IR Hydromorphone Hydrocodone/APAP Morphine  Where is the problem?  Manufacturing and supply level.  Will this shortage affect you?  Only if you take any of the above pain medications.  How? You may be unable to fill your prescription.  Your pharmacist may offer a "partial fill" of your prescription. (Warning: Do not accept partial fills.) Prescriptions  partially filled cannot be transferred to another pharmacy. Read our Medication Rules and Regulation. Depending on how much medicine you are dependent on, you may experience withdrawals when unable to get the medication.  Recommendations: Consider ending your dependence on opioid pain medications. Ask your pain specialist to assist you with the process. Consider switching to a medication currently not in shortage, such as Buprenorphine. Talk to your pain specialist about this option. Consider decreasing your pain medication requirements by managing tolerance thru "Drug Holidays". This may help minimize withdrawals, should you run out of medicine. Control your pain thru the use of non-pharmacological interventional therapies.   Your prescriber: Prescribers cannot be blamed for shortages. Medication manufacturing and supply issues cannot be fixed by the prescriber.   NOTE: The prescriber is not responsible for supplying the medication, or solving supply issues. Work with your pharmacist to solve it. The patient is responsible for the decision to take or continue taking the medication and for identifying and securing a legal supply source. By law, supplying the medication is the job and responsibility of the pharmacy. The prescriber is responsible for the evaluation, monitoring, and prescribing of these medications.   Prescribers will NOT: Re-issue prescriptions that have been partially filled. Re-issue prescriptions already sent to a  pharmacy.  Re-send prescriptions to a different pharmacy because yours did not have your medication. Ask pharmacist to order more medicine or transfer the prescription to another pharmacy. (Read below.)  New 2023 regulation: "September 21, 2021 Revised Regulation Allows DEA-Registered Pharmacies to Transfer Electronic Prescriptions at a Patient's Request DEA Headquarters Division - Public Information Office Patients now have the ability to request their electronic  prescription be transferred to another pharmacy without having to go back to their practitioner to initiate the request. This revised regulation went into effect on Monday, September 17, 2021.     At a patient's request, a DEA-registered retail pharmacy can now transfer an electronic prescription for a controlled substance (schedules II-V) to another DEA-registered retail pharmacy. Prior to this change, patients would have to go through their practitioner to cancel their prescription and have it re-issued to a different pharmacy. The process was taxing and time consuming for both patients and practitioners.    The Drug Enforcement Administration [facility]) published its intent to revise the process for transferring electronic prescriptions on December 10, 2019.  The final rule was published in the federal register on August 16, 2021 and went into effect 30 days later.  Under the final rule, a prescription can only be transferred once between pharmacies, and only if allowed under existing state or other applicable law. The prescription must remain in its electronic form; may not be altered in any way; and the transfer must be communicated directly between two licensed pharmacists. It's important to note, any authorized refills transfer with the original prescription, which means the entire prescription will be filled at the same pharmacy".  Reference: HugeHand.is [facility] website announcement)  CheapWipes.at.pdf Financial planner of Justice)   [employer] / Vol. 88, No. 143 / Thursday, August 16, 2021 / Rules and Regulations DEPARTMENT OF JUSTICE  Drug Enforcement Administration  21 CFR Part 1306  [Docket No. [id number]]  RIN [id number] Transfer of Electronic Prescriptions for Schedules II-V Controlled Substances Between Pharmacies for Initial  Filling  ______________________________________________________________________       ______________________________________________________________________    Transfer of Pain Medication between Pharmacies  Re: 2023 DEA Clarification on existing regulation  Published on DEA Website: September 21, 2021  Title: Revised Regulation Allows DEA-Registered Pharmacies to Electrical engineer Prescriptions at a Patient's Request DEA Headquarters Division - [employer]  "Patients now have the ability to request their electronic prescription be transferred to another pharmacy without having to go back to their practitioner to initiate the request. This revised regulation went into effect on Monday, September 17, 2021.     At a patient's request, a DEA-registered retail pharmacy can now transfer an electronic prescription for a controlled substance (schedules II-V) to another DEA-registered retail pharmacy. Prior to this change, patients would have to go through their practitioner to cancel their prescription and have it re-issued to a different pharmacy. The process was taxing and time consuming for both patients and practitioners.    The Drug Enforcement Administration [facility]) published its intent to revise the process for transferring electronic prescriptions on December 10, 2019.  The final rule was published in the federal register on August 16, 2021 and went into effect 30 days later.  Under the final rule, a prescription can only be transferred once between pharmacies, and only if allowed under existing state or other applicable law. The prescription must remain in its electronic form; may not be altered in any way; and the transfer must be communicated directly between two licensed  pharmacists. It's important to note, any authorized refills transfer with the original prescription, which means the entire prescription will be filled at the same pharmacy."    REFERENCES: 1. DEA website  announcement HugeHand.is  2. Department of Justice website  CheapWipes.at.pdf  3. DEPARTMENT OF JUSTICE Drug Enforcement Administration 21 CFR Part 1306 [Docket No. [id number]] RIN [id number]-[id number] "Transfer of Electronic Prescriptions for Schedules II-V Controlled Substances Between Pharmacies for Initial Filling"  ______________________________________________________________________       ______________________________________________________________________    Medication Rules  Purpose: To inform patients, and their family members, of our medication rules and regulations.  Applies to: All patients receiving prescriptions from our practice (written or electronic).  Pharmacy of record: This is the pharmacy where your electronic prescriptions will be sent. Make sure we have the correct one.  Electronic prescriptions: In compliance with the [facility] Strengthen Opioid Misuse Prevention (STOP) Act of 2017 (Session Conni Elliot [phone]), effective January 21, 2018, all controlled substances must be electronically prescribed. Written prescriptions, faxing, or calling prescriptions to a pharmacy will no longer be done.  Prescription refills: These will be provided only during in-person appointments. No medications will be renewed without a "face-to-face" evaluation with your provider. Applies to all prescriptions.  NOTE: The following applies primarily to controlled substances (Opioid* Pain Medications).   Type of encounter (visit): For patients receiving controlled substances, face-to-face visits are required. (Not an option and not up to the patient.)  Patient's Responsibilities: Pain Pills: Bring all pain pills to every appointment (except for procedure appointments). Pill counts are required.  Pill Bottles: Bring pills in original pharmacy  bottle. Bring bottle, even if empty. Always bring the bottle of the most recent fill.  Medication refills: You are responsible for knowing and keeping track of what medications you are taking and when is it that you will need a refill. The day before your appointment: write a list of all prescriptions that need to be refilled. The day of the appointment: give the list to the admitting nurse. Prescriptions will be written only during appointments. No prescriptions will be written on procedure days. If you forget a medication: it will not be "Called in", "Faxed", or "electronically sent". You will need to get another appointment to get these prescribed. No early refills. Do not call asking to have your prescription filled early. Partial  or short prescriptions: Occasionally your pharmacy may not have enough pills to fill your prescription.  NEVER ACCEPT a partial fill or a prescription that is short of the total amount of pills that you were prescribed.  With controlled substances the law allows 72 hours for the pharmacy to complete the prescription.  If the prescription is not completed within 72 hours, the pharmacist will require a new prescription to be written. This means that you will be short on your medicine and we WILL NOT send another prescription to complete your original prescription.  Instead, request the pharmacy to send a carrier to a nearby branch to get enough medication to provide you with your full prescription. Prescription Accuracy: You are responsible for carefully inspecting your prescriptions before leaving our office. Have the discharge nurse carefully go over each prescription with you, before taking them home. Make sure that your name is accurately spelled, that your address is correct. Check the name and dose of your medication to make sure it is accurate. Check the number of pills, and the written instructions to make sure they are clear and accurate. Make sure that you are given  enough medication to last until your next medication refill appointment. Taking Medication: Take medication as prescribed. When it comes to controlled substances, taking less pills or less frequently than prescribed is permitted and encouraged. Never take more pills than instructed. Never take the medication more frequently than prescribed.  Inform other Doctors: Always inform, all of your healthcare providers, of all the medications you take. Pain Medication from other Providers: You are not allowed to accept any additional pain medication from any other Doctor or Healthcare provider. There are two exceptions to this rule. (see below) In the event that you require additional pain medication, you are responsible for notifying us, as stated below. Cough Medicine: Often these contain an opioid, such as codeine or hydrocodone. Never accept or take cough medicine containing these opioids if you are already taking an opioid* medication. The combination may cause respiratory failure and death. Medication Agreement: You are responsible for carefully reading and following our Medication Agreement. This must be signed before receiving any prescriptions from our practice. Safely store a copy of your signed Agreement. Violations to the Agreement will result in no further prescriptions. (Additional copies of our Medication Agreement are available upon request.) Laws, Rules, & Regulations: All patients are expected to follow all [address] and [employer], Rules, [place] [employer]. Ignorance of the Laws does not constitute a valid excuse.  Illegal drugs and Controlled Substances: The use of illegal substances (including, but not limited to marijuana and its derivatives) and/or the illegal use of any controlled substances is strictly prohibited. Violation of this rule may result in the immediate and permanent discontinuation of any and all prescriptions being written by our practice. The use of any illegal substances  is prohibited. Adopted CDC guidelines & recommendations: Target dosing levels will be at or below 60 MME/day. Use of benzodiazepines** is not recommended. Urine Drug testing: Patients taking controlled substances will be required to provide a urine sample upon request. Do not void before coming to your medication management appointments. Hold emptying your bladder until you are admitted. The admitting nurse will inform you if a sample is required. Our practice reserves the right to call you at any time to provide a sample. Once receiving the call, you have 24 hours to comply with request. Not providing a sample upon request may result in termination of medication therapy.  Exceptions: There are only two exceptions to the rule of not receiving pain medications from other Healthcare Providers. Exception #1 (Emergencies): In the event of an emergency (i.e.: accident requiring emergency care), you are allowed to receive additional pain medication. However, you are responsible for: As soon as you are able, call our office [phone], at any time of the day or night, and leave a message stating your name, the date and nature of the emergency, and the name and dose of the medication prescribed. In the event that your call is answered by a member of our staff, make sure to document and save the date, time, and the name of the person that took your information.  Exception #2 (Planned Surgery): In the event that you are scheduled by another doctor or dentist to have any type of surgery or procedure, you are allowed (for a period no longer than 30 days), to receive additional pain medication, for the acute post-op pain. However, in this case, you are responsible for picking up a copy of our "Post-op Pain Management for Surgeons" handout, and giving it to your surgeon or dentist. This document is available  at our office, and does not require an appointment to obtain it. Simply go to our office during business hours  (Monday-Thursday from 8:00 AM to 4:00 PM) (Friday 8:00 AM to 12:00 Noon) or if you have a scheduled appointment with [place], prior to your surgery, and ask for it by name. In addition, you are responsible for: calling our office [phone], at any time of the day or night, and leaving a message stating your name, name of your surgeon, type of surgery, and date of procedure or surgery. Failure to comply with your responsibilities may result in termination of therapy involving the controlled substances.  Consequences:  Non-compliance with the above rules may result in permanent discontinuation of medication prescription therapy. All patients receiving any type of controlled substance is expected to comply with the above patient responsibilities. Not doing so may result in permanent discontinuation of medication prescription therapy. Medication Agreement Violation. Following the above rules, including your responsibilities will help you in avoiding a Medication Agreement Violation ("Breaking your Pain Medication Contract").  *Opioid medications include: morphine, codeine, oxycodone, oxymorphone, hydrocodone, hydromorphone, meperidine, tramadol, tapentadol, buprenorphine, fentanyl, methadone. **Benzodiazepine medications include: diazepam (Valium), alprazolam (Xanax), clonazepam (Klonopine), lorazepam (Ativan), clorazepate (Tranxene), chlordiazepoxide (Librium), estazolam (Prosom), oxazepam (Serax), temazepam (Restoril), triazolam (Halcion) (Last updated: 11/13/2022) ______________________________________________________________________      ______________________________________________________________________    Medication Recommendations and Reminders  Applies to: All patients receiving prescriptions (written and/or electronic).  Medication Rules & Regulations: You are responsible for reading, knowing, and following our "Medication Rules" document. These exist for your safety and that of others.  They are not flexible and neither are we. Dismissing or ignoring them is an act of "non-compliance" that may result in complete and irreversible termination of such medication therapy. For safety reasons, "non-compliance" will not be tolerated. As with the [place] fundamental legal principle of "ignorance of the law is no defense", we will accept no excuses for not having read and knowing the content of documents provided to you by our practice.  Pharmacy of record:  Definition: This is the pharmacy where your electronic prescriptions will be sent.  We do not endorse any particular pharmacy. It is up to you and your insurance to decide what pharmacy to use.  We do not restrict you in your choice of pharmacy. However, once we write for your prescriptions, we will NOT be re-sending more prescriptions to fix restricted supply problems created by your pharmacy, or your insurance.  The pharmacy listed in the electronic medical record should be the one where you want electronic prescriptions to be sent. If you choose to change pharmacy, simply notify our nursing staff. Changes will be made only during your regular appointments and not over the phone.  Recommendations: Keep all of your pain medications in a safe place, under lock and key, even if you live alone. We will NOT replace lost, stolen, or damaged medication. We do not accept "Police Reports" as proof of medications having been stolen. After you fill your prescription, take 1 week's worth of pills and put them away in a safe place. You should keep a separate, properly labeled bottle for this purpose. The remainder should be kept in the original bottle. Use this as your primary supply, until it runs out. Once it's gone, then you know that you have 1 week's worth of medicine, and it is time to come in for a prescription refill. If you do this correctly, it is unlikely that you will ever run out of medicine. To  make sure that the above recommendation works,  it is very important that you make sure your medication refill appointments are scheduled at least 1 week before you run out of medicine. To do this in an effective manner, make sure that you do not leave the office without scheduling your next medication management appointment. Always ask the nursing staff to show you in your prescription , when your medication will be running out. Then arrange for the receptionist to get you a return appointment, at least 7 days before you run out of medicine. Do not wait until you have 1 or 2 pills left, to come in. This is very poor planning and does not take into consideration that we may need to cancel appointments due to bad weather, sickness, or emergencies affecting our staff. DO NOT ACCEPT A "Partial Fill": If for any reason your pharmacy does not have enough pills/tablets to completely fill or refill your prescription, do not allow for a "partial fill". The law allows the pharmacy to complete that prescription within 72 hours, without requiring a new prescription. If they do not fill the rest of your prescription within those 72 hours, you will need a separate prescription to fill the remaining amount, which we will NOT provide. If the reason for the partial fill is your insurance, you will need to talk to the pharmacist about payment alternatives for the remaining tablets, but again, DO NOT ACCEPT A PARTIAL FILL, unless you can trust your pharmacist to obtain the remainder of the pills within 72 hours.  Prescription refills and/or changes in medication(s):  Prescription refills, and/or changes in dose or medication, will be conducted only during scheduled medication management appointments. (Applies to both, written and electronic prescriptions.) No refills on procedure days. No medication will be changed or started on procedure days. No changes, adjustments, and/or refills will be conducted on a procedure day. Doing so will interfere with the diagnostic portion of  the procedure. No phone refills. No medications will be "called into the pharmacy". No Fax refills. No weekend refills. No Holliday refills. No after hours refills.  Remember:  Business hours are:  Monday to Thursday 8:00 AM to 4:00 PM Provider's Schedule: Delano Metz, MD - Appointments are:  Medication management: Monday and Wednesday 8:00 AM to 4:00 PM Procedure day: Tuesday and Thursday 7:30 AM to 4:00 PM Edward Jolly, MD - Appointments are:  Medication management: Tuesday and Thursday 8:00 AM to 4:00 PM Procedure day: Monday and Wednesday 7:30 AM to 4:00 PM (Last update: 11/13/2021) ______________________________________________________________________      ______________________________________________________________________     Naloxone Nasal Spray  Why am I receiving this medication? [place] STOP ACT requires that all patients taking high dose opioids or at risk of opioids respiratory depression, be prescribed an opioid reversal agent, such as Naloxone (AKA: Narcan).  What is this medication? NALOXONE (nal OX one) treats opioid overdose, which causes slow or shallow breathing, severe drowsiness, or trouble staying awake. Call emergency services after using this medication. You may need additional treatment. Naloxone works by reversing the effects of opioids. It belongs to a group of medications called opioid blockers.  COMMON BRAND NAME(S): Kloxxado, Narcan  What should I tell my care team before I take this medication? They need to know if you have any of these conditions: Heart disease Substance use disorder An unusual or allergic reaction to naloxone, other medications, foods, dyes, or preservatives Pregnant or trying to get pregnant Breast-feeding  When to use this medication? This  medication is to be used for the treatment of respiratory depression (less than 8 breaths per minute) secondary to opioid overdose.   How to use this medication? This  medication is for use in the nose. Lay the person on their back. Support their neck with your hand and allow the head to tilt back before giving the medication. The nasal spray should be given into 1 nostril. After giving the medication, move the person onto their side. Do not remove or test the nasal spray until ready to use. Get emergency medical help right away after giving the first dose of this medication, even if the person wakes up. You should be familiar with how to recognize the signs and symptoms of a narcotic overdose. If more doses are needed, give the additional dose in the other nostril. Talk to your care team about the use of this medication in children. While this medication may be prescribed for children as young as newborns for selected conditions, precautions do apply.  Naloxone Overdosage: If you think you have taken too much of this medicine contact a poison control center or emergency room at once.  NOTE: This medicine is only for you. Do not share this medicine with others.  What if I miss a dose? This does not apply.  What may interact with this medication? This is only used during an emergency. No interactions are expected during emergency use. This list may not describe all possible interactions. Give your health care provider a list of all the medicines, herbs, non-prescription drugs, or dietary supplements you use. Also tell them if you smoke, drink alcohol, or use illegal drugs. Some items may interact with your medicine.  What should I watch for while using this medication? Keep this medication ready for use in the case of an opioid overdose. Make sure that you have the phone number of your care team and local hospital ready. You may need to have additional doses of this medication. Each nasal spray contains a single dose. Some emergencies may require additional doses. After use, bring the treated person to the nearest hospital or call 911. Make sure the treating care  team knows that the person has received a dose of this medication. You will receive additional instructions on what to do during and after use of this medication before an emergency occurs.  What side effects may I notice from receiving this medication? Side effects that you should report to your care team as soon as possible: Allergic reactions--skin rash, itching, hives, swelling of the face, lips, tongue, or throat Side effects that usually do not require medical attention (report these to your care team if they continue or are bothersome): Constipation Dryness or irritation inside the nose Headache Increase in blood pressure Muscle spasms Stuffy nose Toothache This list may not describe all possible side effects. Call your doctor for medical advice about side effects. You may report side effects to FDA at 1-800-FDA-1088.  Where should I keep my medication? Because this is an emergency medication, you should keep it with you at all times.  Keep out of the reach of children and pets. Store between 20 and 25 degrees C (68 and 77 degrees F). Do not freeze. Throw away any unused medication after the expiration date. Keep in original box until ready to use.  NOTE: This sheet is a summary. It may not cover all possible information. If you have questions about this medicine, talk to your doctor, pharmacist, or health care provider.  2023 Elsevier/Gold Standard (2020-09-15 00:00:00)  ______________________________________________________________________

## 2023-03-27 ENCOUNTER — Other Ambulatory Visit: Payer: Self-pay | Admitting: Pulmonary Disease

## 2023-03-27 DIAGNOSIS — R911 Solitary pulmonary nodule: Secondary | ICD-10-CM

## 2023-04-04 ENCOUNTER — Ambulatory Visit
Admission: RE | Admit: 2023-04-04 | Discharge: 2023-04-04 | Disposition: A | Discharge status: Home / Self Care | Source: Ambulatory Visit | Attending: Pulmonary Disease | Admitting: Pulmonary Disease

## 2023-04-04 DIAGNOSIS — R911 Solitary pulmonary nodule: Secondary | ICD-10-CM | POA: Diagnosis present

## 2023-04-25 ENCOUNTER — Other Ambulatory Visit: Payer: Self-pay | Admitting: Pain Medicine

## 2023-04-25 DIAGNOSIS — M542 Cervicalgia: Secondary | ICD-10-CM

## 2023-04-25 DIAGNOSIS — Z79899 Other long term (current) drug therapy: Secondary | ICD-10-CM

## 2023-04-25 DIAGNOSIS — G8929 Other chronic pain: Secondary | ICD-10-CM

## 2023-04-25 DIAGNOSIS — M545 Low back pain, unspecified: Secondary | ICD-10-CM

## 2023-04-25 DIAGNOSIS — M47812 Spondylosis without myelopathy or radiculopathy, cervical region: Secondary | ICD-10-CM

## 2023-04-25 DIAGNOSIS — Z79891 Long term (current) use of opiate analgesic: Secondary | ICD-10-CM

## 2023-04-25 DIAGNOSIS — G894 Chronic pain syndrome: Secondary | ICD-10-CM

## 2023-04-25 DIAGNOSIS — M2550 Pain in unspecified joint: Secondary | ICD-10-CM

## 2023-05-07 IMAGING — CT CT CHEST LUNG CANCER SCREENING LOW DOSE W/O CM
1 series · 14 of 33 positions shown, 18 images · non-contrast
Comparison: None.

CLINICAL DATA: Current smoker with 44 pack-year history



[Series 2: axial st · axial · 0.64mm/px · z∈[-728,-464]mm · 14 of 63 slices shown, 18 images]
[im 5/63  mediastinal]
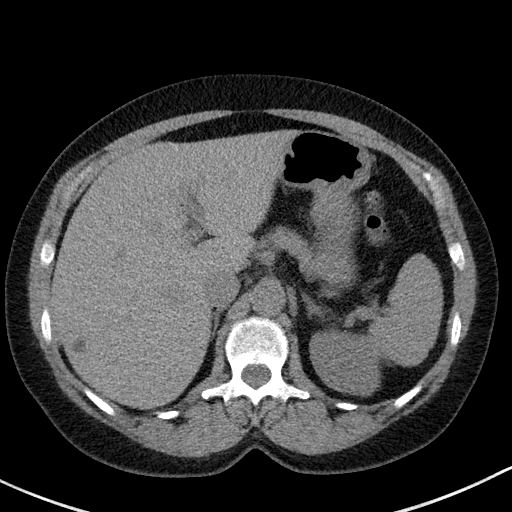
[im 5/63  lung]
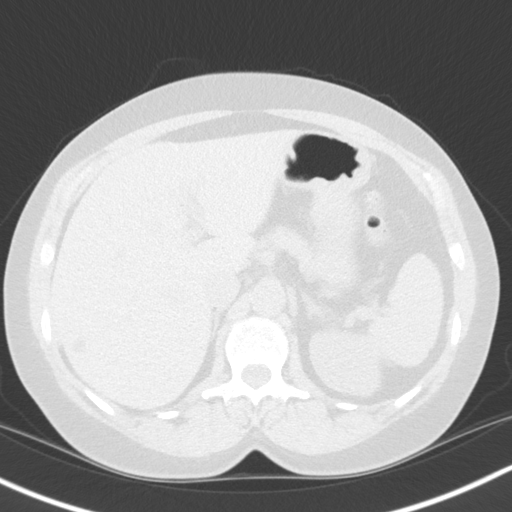
[im 10/63  lung]
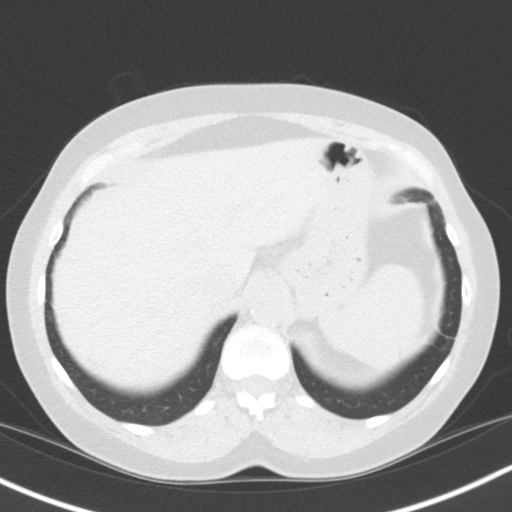
[im 13/63  lung]
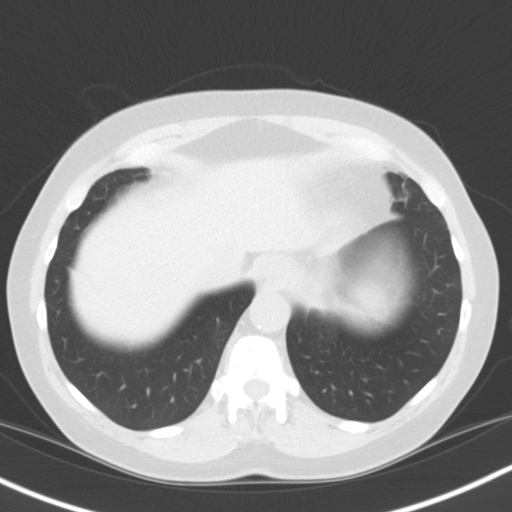
[im 17/63  lung]
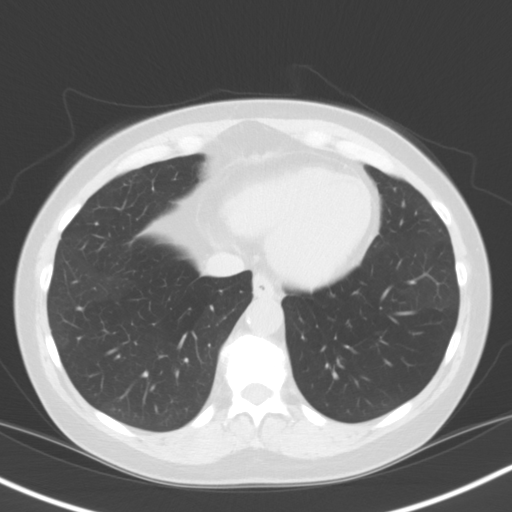
[im 21/63  mediastinal]
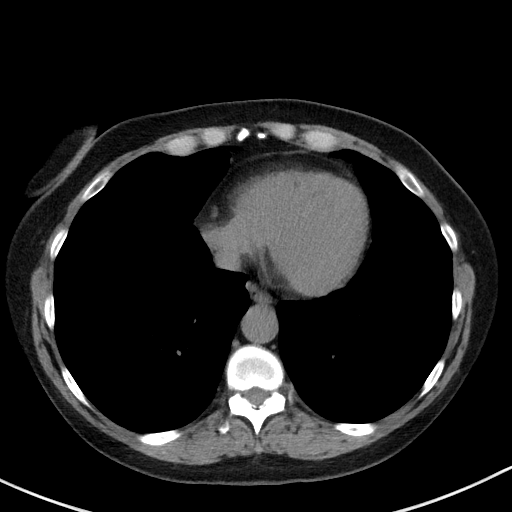
[im 21/63  lung]
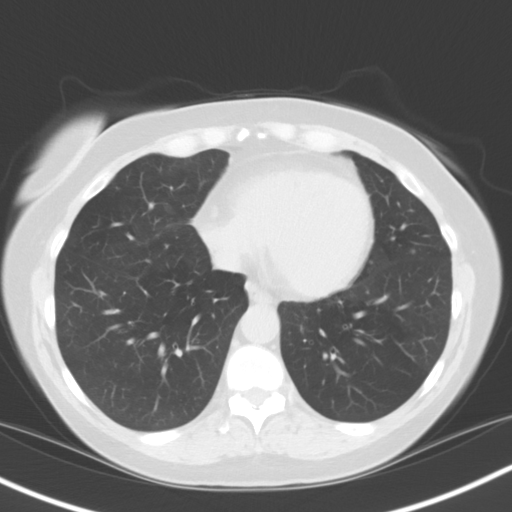
[im 26/63  lung]
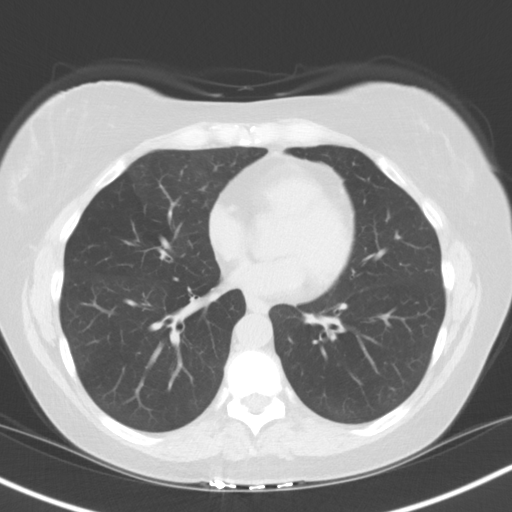
[im 30/63  lung]
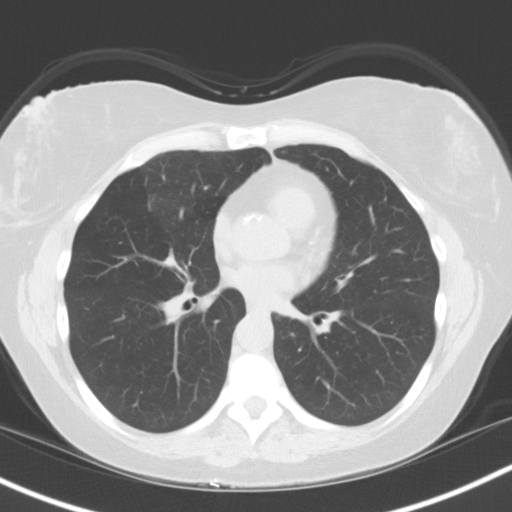
[im 34/63  lung]
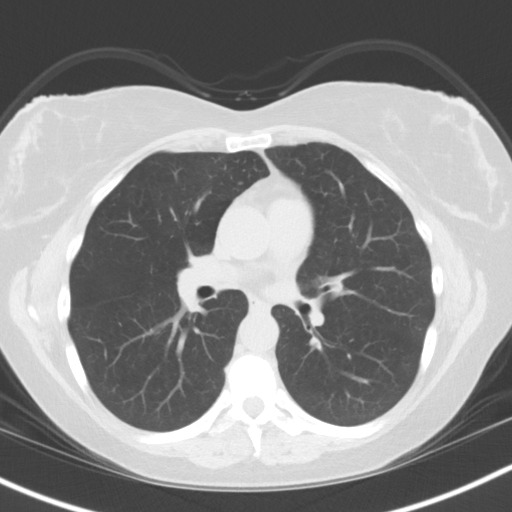
[im 37/63  mediastinal]
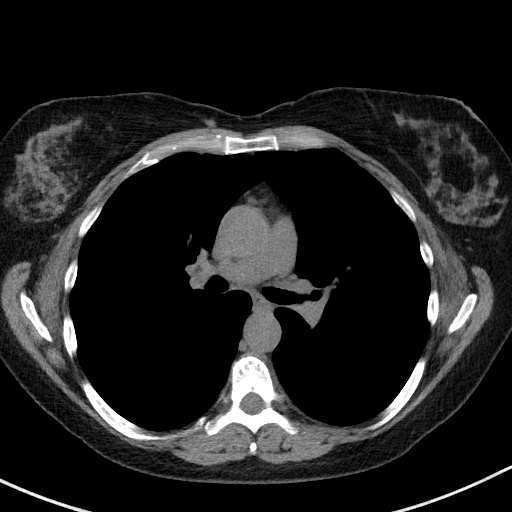
[im 37/63  lung]
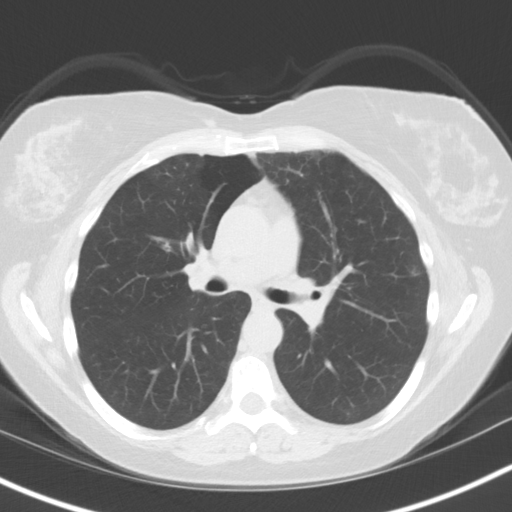
[im 42/63  lung]
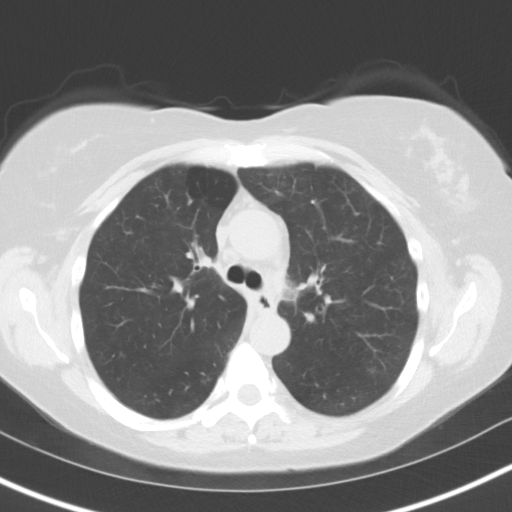
[im 46/63  lung]
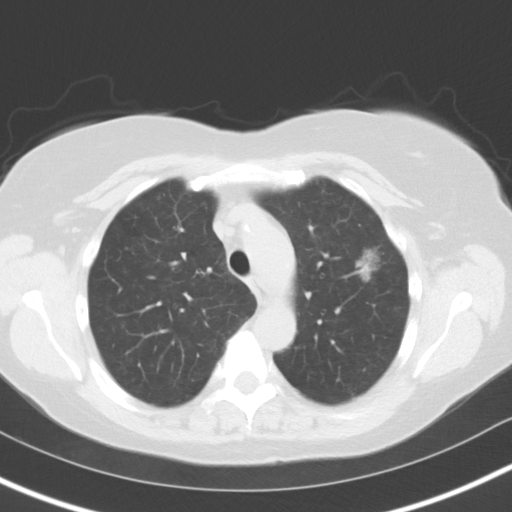
[im 50/63  lung]
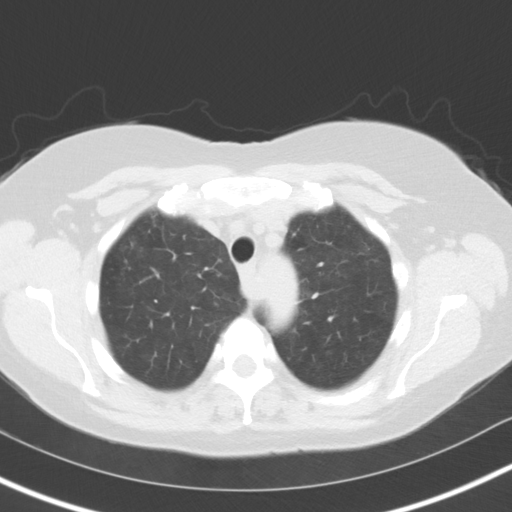
[im 53/63  mediastinal]
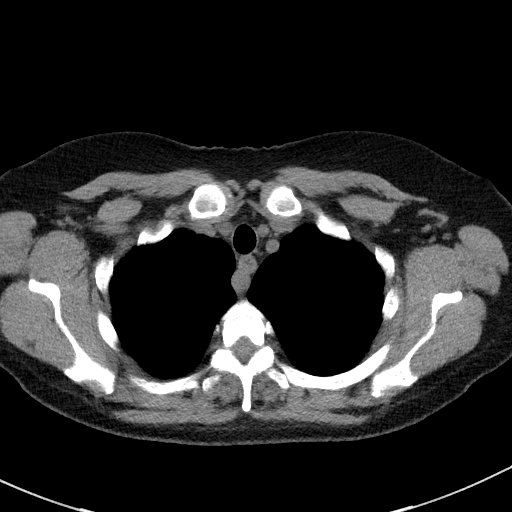
[im 53/63  lung]
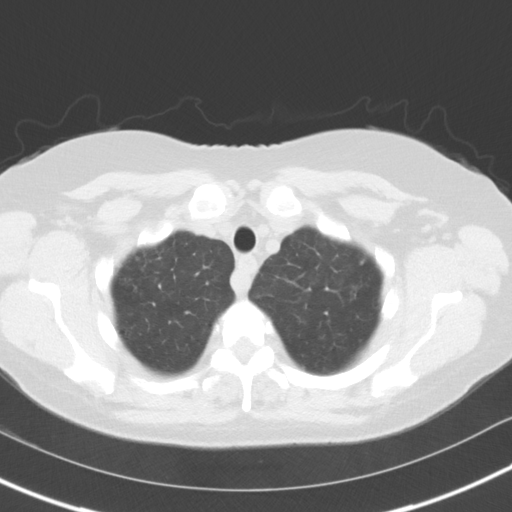
[im 58/63  lung]
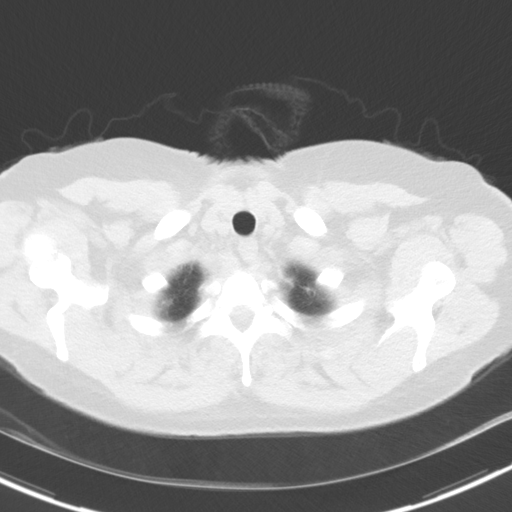

[14 of 33 positions shown; findings below may reference images not displayed]

FINDINGS: Cardiovascular: Normal heart size. No pericardial effusion. Mild
coronary artery calcifications of the LAD. Mild atherosclerotic
disease of the thoracic aorta.

Mediastinum/Nodes: Esophagus and thyroid are unremarkable. No
pathologically enlarged lymph nodes seen in the chest.

Lungs/Pleura: Central airways are patent. Centrilobular emphysema.
No consolidation, pleural effusion or pneumothorax. Multiple part
solid nodules are seen in the left upper lobe. Larger part solid
nodule measures up to 23.4 Mm in mean diameter with 19.9 mm solid
component, located on image 82. Multiple subtle and ill-defined
centrilobular ground-glass nodules are seen primarily in the upper
lungs. Additional small bilateral solid pulmonary nodules are seen
measuring less 4 mm.

Upper Abdomen: Scattered low-attenuation liver lesions which are
likely simple cysts. No acute abnormality.

Musculoskeletal: No chest wall mass or suspicious bone lesions
identified.
IMPRESSION: 1. Multiple part solid nodules are seen in the left upper lobe,
largest measures up to 23.4 mm with 19.9 mm solid component.
Lung-RADS 4BS, suspicious. Additional imaging evaluation or
consultation with Pulmonology or Thoracic Surgery recommended. S
modifier for coronary artery calcifications and respiratory
bronchiolitis.
2. Ill-defined centrilobular ground-glass nodules primarily in the
upper lungs, findings are compatible with smoking related
respiratory bronchiolitis.
3. Mild coronary artery calcifications of the LAD, insert CAD.
4. Aortic Atherosclerosis (JTXC6-UZ9.9) and Emphysema ([id number]).

These results will be called to the ordering clinician or
representative by the Radiologist Assistant, and communication
documented in the PACS or [REDACTED].

## 2023-05-18 NOTE — Progress Notes (Unsigned)
 PROVIDER NOTE: Interpretation of information contained herein should be left to medically-trained personnel. Specific patient instructions are provided elsewhere under "Patient Instructions" section of medical record. This document was created in part using AI and STT-dictation technology, any transcriptional errors that may result from this process are unintentional.  Patient: Tanya [name]  Service: E/M   PCP: Eartha Gold, MD  DOB: 1960-09-08  DOS: 05/19/2023  Provider: Candi Chafe, MD  MRN: [id number]  Delivery: Face-to-face  Specialty: Interventional Pain Management  Type: Established Patient  Setting: Ambulatory outpatient facility  Specialty designation: 09  Referring Prov.: Eartha Gold, MD  Location: Outpatient office facility       HPI  [name], a 63 y.o. year old female, is here today because of her Chronic pain syndrome [G89.4]. Ms. Durnin primary complain today is No chief complaint on file.  Pertinent problems: Ms. Campa has Chronic upper back pain (2ry area of Pain) (Bilateral) (R>L); Chronic low back pain (3ry area of Pain) (Bilateral) (R>L) w/o sciatica; Chronic neck pain (1ry area of Pain) (Bilateral) (R>L); Fibromyalgia; Osteoarthritis; Chronic pain syndrome; Chronic upper extremity pain (Right); Chronic cervical radicular pain (Right); Chronic shoulder pain (Bilateral) (R>L); Chronic musculoskeletal pain; Cervical spondylosis; Cervical facet syndrome (Bilateral) (R>L); Shoulder radicular pain (Bilateral) (R>L); Acute postoperative pain; Spondylosis without myelopathy or radiculopathy, cervical region; Cervicalgia; Occipital headache (Left); Neurogenic pain; Cervico-occipital neuralgia (Left); Chronic elbow pain (Left); Osteoarthritis of AC (acromioclavicular) joint (Right); Chronic acromioclavicular joint pain (Right); Chronic shoulder pain (Right); Epicondylitis elbow, medial (Left); Osteoarthritis of shoulders (Bilateral) (R>L);  Chronic shoulder pain (Left); Osteoarthritis of shoulder (Left); DDD (degenerative disc disease), cervical; Arthropathy of cervical facet joint (Bilateral); Cervical spine pain; Abnormal MRI, cervical spine (09/20/2015); Cervical central spinal stenosis (Multilevel); 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 Pain Assessment: Severity of   is reported as a  /10. Location:    / . Onset:  . Quality:  . Timing:  . Modifying factor(s):  Aaron Aas Vitals:  vitals were not taken for this visit.  BMI: Estimated body mass index is 26.57 kg/m as calculated from the following:   Height as of 02/19/23: 5\' 3"  (1.6 m).   Weight as of 02/19/23: 150 lb (68 kg). Last encounter: 02/19/2023. Last procedure: 07/18/2022.  Reason for encounter: medication management. ***  Routine UDS ordered today.   Discussed the use of AI scribe software for clinical note transcription with the patient, who gave verbal consent to proceed.  History of Present Illness         RTCB: 08/24/2023   Pharmacotherapy Assessment  Analgesic: Oxycodone  IR 5 mg, 1 tab p.o. twice daily (60/month) (10 mg/day of oxycodone ) MME/day: 15 mg/day.   Monitoring: [place] PMP: PDMP reviewed during this encounter.       Pharmacotherapy: No side-effects or adverse reactions reported. Compliance: No problems identified. Effectiveness: Clinically acceptable.  No notes on file  No results found for: "CBDTHCR" No results found for: "D8THCCBX" No results found for: "D9THCCBX"  UDS:  Summary  Date Value Ref Range Status  05/01/2022 Note  Final    Comment:    ==================================================================== [employer] Select 13  (MW) ==================================================================== Specimen Alert Note: Urinary creatinine is low; ability to detect some drugs may be compromised. Interpret results with caution. (Creatinine) ==================================================================== Test  Result       Flag       Units  Drug Absent but Declared for Prescription Verification   Oxycodone                       Not Detected UNEXPECTED ng/mg creat ==================================================================== Test                      Result    Flag   Units      Ref Range   Creatinine              11        LL     mg/dL      >=16 ==================================================================== Declared Medications:  The flagging and interpretation on this report are based on the  following declared medications.  Unexpected results may arise from  inaccuracies in the declared medications.   **Note: The testing scope of this panel includes these medications:   Oxycodone  (Roxicodone )   **Note: The testing scope of this panel does not include the  following reported medications:   Albuterol  (Ventolin  HFA)  Atorvastatin (Lipitor)  Clobetasol (Temovate)  Duloxetine  (Cymbalta )  Folic Acid  Gabapentin  (Neurontin )  Hydroxychloroquine (Plaquenil)  Hydroxyzine  (Vistaril )  Methotrexate  Naloxone  (Narcan )  Nicotine  ==================================================================== For clinical consultation, please call [phone]. ====================================================================       ROS  Constitutional: Denies any fever or chills Gastrointestinal: No reported hemesis, hematochezia, vomiting, or acute GI distress Musculoskeletal: Denies any acute onset joint swelling, redness, loss of ROM, or weakness Neurological: No reported episodes of acute onset apraxia, aphasia, dysarthria, agnosia, amnesia, paralysis, loss of  coordination, or loss of consciousness  Medication Review  AeroChamber MV, albuterol , amoxicillin -clavulanate, atorvastatin, azithromycin , baclofen , clobetasol cream, fluticasone , gabapentin , hydrOXYzine , hydroxychloroquine, mycophenolate, naloxone , nicotine , oxyCODONE , predniSONE , and vortioxetine HBr  History Review  Allergy: Ms. Mesker is allergic to tessalon  [benzonatate ], pregabalin, trazodone, bupropion, methocarbamol, and tramadol. Drug: Ms. Desposito  reports no history of drug use. Alcohol:  reports no history of alcohol use. Tobacco:  reports that she has been smoking cigarettes. She has a 35 pack-year smoking history. She has been exposed to tobacco smoke. She has never used smokeless tobacco. Social: Ms. Popovich  reports that she has been smoking cigarettes. She has a 35 pack-year smoking history. She has been exposed to tobacco smoke. She has never used smokeless tobacco. She reports that she does not drink alcohol and does not use drugs. Medical:  has a past medical history of Anxiety, Bladder infection, Chronic lower back pain, Chronic neck pain, Collagen vascular disease (HCC), COVID-19, Depression, Diverticulitis, GERD (gastroesophageal reflux disease), Hyperlipidemia, Lupus, and Overactive bladder. Surgical: Ms. Trottier  has a past surgical history that includes Abdominal hysterectomy; Colonoscopy; Colonoscopy with propofol  (N/A, 07/03/2017); Tonsillectomy; Esophagogastroduodenoscopy (N/A, 10/21/2017); and Colonoscopy with propofol  (N/A, 10/21/2017). Family: family history includes Alcohol abuse in her mother; Cancer in her mother; Depression in her mother; Diabetes in her brother; Drug abuse in her son; Emphysema in her mother; Glaucoma in her father; Heart disease in her father; Hypertension in her mother; Stroke in her mother.  Laboratory Chemistry Profile   Renal Lab Results  Component Value Date   BUN 16 01/10/2023   CREATININE 0.79 01/10/2023   BCR 15 09/03/2017    GFRAA >60 06/09/2019   GFRNONAA >60 01/10/2023    Hepatic Lab Results  Component Value Date   AST 26 06/09/2019   ALT 20 06/09/2019   ALBUMIN 4.1 06/09/2019  ALKPHOS 29 (L) 06/09/2019   LIPASE 35 06/09/2019    Electrolytes Lab Results  Component Value Date   NA 138 01/10/2023   K 3.9 01/10/2023   CL 103 01/10/2023   CALCIUM 9.4 01/10/2023   MG 1.9 09/03/2017    Bone Lab Results  Component Value Date   25OHVITD1 25 (L) 09/03/2017   25OHVITD2 2.9 09/03/2017   25OHVITD3 22 09/03/2017    Inflammation (CRP: Acute Phase) (ESR: Chronic Phase) Lab Results  Component Value Date   CRP 2 09/03/2017   ESRSEDRATE 21 09/03/2017         Note: Above Lab results reviewed.  Recent Imaging Review  CT CHEST WO CONTRAST CLINICAL DATA:  Follow-up lung nodule  EXAM: CT CHEST WITHOUT CONTRAST  TECHNIQUE: Multidetector CT imaging of the chest was performed following the standard protocol without IV contrast.  RADIATION DOSE REDUCTION: This exam was performed according to the departmental dose-optimization program which includes automated exposure control, adjustment of the mA and/or kV according to patient size and/or use of iterative reconstruction technique.  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 Thyroid  gland, trachea, and esophagus demonstrate no significant findings.  Lungs/Pleura: Comparison with prior examination accounting for differences in techniques there is no significant change in the ground-glass appearance parenchymal density of the left upper lobe measuring 2.5 by 1.9 cm in maximum diameter AP and transverse diameter.  Comparison  with prior examination no significant change in the ill-defined ground-glass appearance of both lungs which correlates with chronic interstitial lung disease, and COPD changes  No other pulmonary nodules or masses.  Upper Abdomen: No acute abnormality. No change in the multiple hepatic cysts compared with priors  Musculoskeletal: No chest wall mass or suspicious bone lesions identified.  IMPRESSION: *No acute findings. 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 *No change in the left upper lobe parenchymal ground-glass appearing compared with prior examinations. Level of suspicion for bronchogenic carcinoma or alveolar cell carcinoma remains unchanged.  Electronically Signed   By: Fredrich Jefferson M.D.   On: 04/14/2023 14:06 Note: Reviewed        Physical Exam  General appearance: Well nourished, well developed, and well hydrated. In no apparent acute distress Mental status: Alert, oriented x 3 (person, place, & time)       Respiratory: No evidence of acute respiratory distress Eyes: PERLA Vitals: There were no vitals taken for this visit. BMI: Estimated body mass index is 26.57 kg/m as calculated from the following:   Height as of 02/19/23: 5\' 3"  (1.6 m).   Weight as of 02/19/23: 150 lb (68 kg). Ideal: Patient weight not recorded  Assessment   Diagnosis Status  1. Chronic pain syndrome   2. Chronic neck pain (1ry area of Pain) (Bilateral) (R>L)   3. Chronic upper back pain (2ry area of Pain) (Bilateral) (R>L)   4. Chronic low back pain (3ry area of Pain) (Bilateral) (R>L) w/o sciatica   5. Cervical spine pain   6. Cervical facet syndrome (Bilateral) (R>L)   7. Cervicalgia   8. Chronic shoulder pain (Bilateral) (R>L)   9. Chronic generalized arthralgias of multiple sites  10. Chronic musculoskeletal pain   11. Pharmacologic therapy   12.  Chronic use of opiate for therapeutic purpose   13. Encounter for medication management   14. Encounter for chronic pain management    Controlled Controlled Controlled   Updated Problems: No problems updated.  Plan of Care  Problem-specific:  Assessment and Plan            Ms. POLLY SAVARD has a current medication list which includes the following long-term medication(s): fluticasone , gabapentin , oxycodone , and ventolin  hfa.  Pharmacotherapy (Medications Ordered): No orders of the defined types were placed in this encounter.  Orders:  No orders of the defined types were placed in this encounter.  Follow-up plan:   No follow-ups on file.     Interventional Therapies  Risk Factors  Considerations:   Antiplatelet therapy: Plaquenil (Stop: 11 days  Restart: next day)  Risk of prolonged QT Syndrome  Tobacco abuse  Anxiety  Depression \ Insomnia  SLE  Urinary incontinence     Planned  Pending:   Therapeutic left cervical facet RFA #4    Under consideration:   Therapeutic bilateral cervical facet MB RFA #4 (starting with the left side)   Completed:   Therapeutic left IA shoulder joint inj. x1 (12/30/2019) (100/100/100 x 5 days/0)  Palliative right IA shoulder joint inj. x2 (10/06/2018) (100/100/50/50)  Palliative right AC joint inj. x3 (04/02/2018) (N/A)  Palliative left medial epicondyle elbow inj. x1 (04/02/2018) (N/A)  Diagnostic left GONB x1 (12/25/2017) (N/A)  Palliative right CESI x2 (02/12/2016) (N/A) Therapeutic left C7-T1 cervical ESI x1 (03/18/2016) (100/50/0/0)  Palliative bilateral cervical facet MBB x2 (06/26/2016)  Palliative right cervical facet RFA x3 (04/13/2020) (100/100/75/75)  Palliative left cervical facet RFA x3 (03/14/2020) (100/100/75/75)  Diagnostic/therapeutic midline C7-T1 TPI/MNB x1 (10/08/2021) (100/100/100/100)    Therapeutic  Palliative (PRN) options:   Palliative cervical facet MBB  Palliative cervical facet RFA      Pharmacotherapy  Nonopioids transfer 12/22/2019: Baclofen  and Neurontin       Recent Visits Date Type Provider Dept  02/19/23 Office Visit Renaldo Caroli, MD [facility]  Showing recent visits within past 90 days and meeting all other requirements Future Appointments Date Type Provider Dept  05/19/23 Appointment Renaldo Caroli, MD [facility]  Showing future appointments within next 90 days and meeting all other requirements  I discussed the assessment and treatment plan with the patient. The patient was provided an opportunity to ask questions and all were answered. The patient agreed with the plan and demonstrated an understanding of the instructions.  Patient advised to call back or seek an in-person evaluation if the symptoms or condition worsens.  Duration of encounter: *** minutes.  Total time on encounter, as per AMA guidelines included both the face-to-face and non-face-to-face time personally spent by the physician and/or other qualified health care professional(s) on the day of the encounter (includes time in activities that require the physician or other qualified health care professional and does not include time in activities normally performed by clinical staff). Physician's time may include the following activities when performed: Preparing to see the patient (e.g., pre-charting review of records, searching for previously ordered imaging, lab work, and nerve conduction tests) Review of prior analgesic pharmacotherapies. Reviewing PMP Interpreting ordered tests (e.g., lab work, imaging, nerve conduction tests) Performing post-procedure evaluations, including interpretation of diagnostic procedures Obtaining and/or reviewing separately obtained history Performing a medically appropriate examination and/or evaluation Counseling and educating the patient/family/caregiver Ordering medications, tests, or procedures Referring and  communicating with  other health care professionals (when not separately reported) Documenting clinical information in the electronic or other health record Independently interpreting results (not separately reported) and communicating results to the patient/ family/caregiver Care coordination (not separately reported)  Note by: Candi Chafe, MD (TTS and AI technology used. I apologize for any typographical errors that were not detected and corrected.) Date: 05/19/2023; Time: 10:20 AM

## 2023-05-18 NOTE — Patient Instructions (Incomplete)
 ______________________________________________________________________    New Medication Management Provider - Marthe Slain, NP  Purpose: To inform patients of the addition of a new member to our group, Marthe Slain, NP.  Applies to: All patients receiving prescriptions from our practice (written or electronic).  Announcement: We are happy to announce the addition of Marthe Slain, NP to or practice ([facility]).  She will take up a support role assisting our interventional pain specialists in the management of our patients.  She will be primarily assigned to the medication management portion of our practice.  Physician assignment: Patient will continue to be assigned to their current Pain Specialist Physician however, Ms. Lydia Sams, NP will take over the Medication Management visits along with the responsibilities associated with such visits.  Medication Management: Any questions or inquiries associated with the day-to-day management of your pain medications, refills, or anything else associated with those prescriptions should be directed to Ms. Marthe Slain, NP.  Interventional Therapies: All issues associated with these therapies will continue to be managed by your Primary Pain Specialist.   Requesting appointments: When requesting that appointment, please make sure to specify whether the appointment is for Medication Management (to be scheduled with Ms. Lydia Sams, NP) or if an evaluation is required/desired with your Primary Pain Specialist.  (Last updated: 04/22/2023) ______________________________________________________________________      ______________________________________________________________________    Opioid Pain Medication Update  To: All patients taking opioid pain medications. (I.e.: hydrocodone , hydromorphone, oxycodone , oxymorphone, morphine, codeine , methadone, tapentadol, tramadol, buprenorphine, fentanyl , etc.)  Re:  Updated review of side effects and adverse reactions of opioid analgesics, as well as new information about long term effects of this class of medications.  Direct risks of long-term opioid therapy are not limited to opioid addiction and overdose. Potential medical risks include serious fractures, breathing problems during sleep, hyperalgesia, immunosuppression, chronic constipation, bowel obstruction, myocardial infarction, and tooth decay secondary to xerostomia.  Unpredictable adverse effects that can occur even if you take your medication correctly: Cognitive impairment, respiratory depression, and death. Most people think that if they take their medication "correctly", and "as instructed", that they will be safe. Nothing could be farther from the truth. In reality, a significant amount of recorded deaths associated with the use of opioids has occurred in individuals that had taken the medication for a long time, and were taking their medication correctly. The following are examples of how this can happen: Patient taking his/her medication for a long time, as instructed, without any side effects, is given a certain antibiotic or another unrelated medication, which in turn triggers a "Drug-to-drug interaction" leading to disorientation, cognitive impairment, impaired reflexes, respiratory depression or an untoward event leading to serious bodily harm or injury, including death.  Patient taking his/her medication for a long time, as instructed, without any side effects, develops an acute impairment of liver and/or kidney function. This will lead to a rapid inability of the body to breakdown and eliminate their pain medication, which will result in effects similar to an "overdose", but with the same medicine and dose that they had always taken. This again may lead to disorientation, cognitive impairment, impaired reflexes, respiratory depression or an untoward event leading to serious bodily harm or injury,  including death.  A similar problem will occur with patients as they grow older and their liver and kidney function begins to decrease as part of the aging process.  Background information: Historically, the original case for using long-term opioid therapy to treat chronic noncancer pain was  based on safety assumptions that subsequent experience has called into question. In 1996, the American Pain Society and the American Academy of Pain Medicine issued a consensus statement supporting long-term opioid therapy. This statement acknowledged the dangers of opioid prescribing but concluded that the risk for addiction was low; respiratory depression induced by opioids was short-lived, occurred mainly in opioid-naive patients, and was antagonized by pain; tolerance was not a common problem; and efforts to control diversion should not constrain opioid prescribing. This has now proven to be wrong. Experience regarding the risks for opioid addiction, misuse, and overdose in community practice has failed to support these assumptions.  According to the Centers for Disease Control and Prevention, fatal overdoses involving opioid analgesics have increased sharply over the past decade. Currently, more than 96,700 people die from drug overdoses every year. Opioids are a factor in 7 out of every 10 overdose deaths. Deaths from drug overdose have surpassed motor vehicle accidents as the leading cause of death for individuals between the ages of 106 and 92.  Clinical data suggest that neuroendocrine dysfunction may be very common in both men and women, potentially causing hypogonadism, erectile dysfunction, infertility, decreased libido, osteoporosis, and depression. Recent studies linked higher opioid dose to increased opioid-related mortality. Controlled observational studies reported that long-term opioid therapy may be associated with increased risk for cardiovascular events. Subsequent meta-analysis concluded that the  safety of long-term opioid therapy in elderly patients has not been proven.   Side Effects and adverse reactions: Common side effects: Drowsiness (sedation). Dizziness. Nausea and vomiting. Constipation. Physical dependence -- Dependence often manifests with withdrawal symptoms when opioids are discontinued or decreased. Tolerance -- As you take repeated doses of opioids, you require increased medication to experience the same effect of pain relief. Respiratory depression -- This can occur in healthy people, especially with higher doses. However, people with COPD, asthma or other lung conditions may be even more susceptible to fatal respiratory impairment.  Uncommon side effects: An increased sensitivity to feeling pain and extreme response to pain (hyperalgesia). Chronic use of opioids can lead to this. Delayed gastric emptying (the process by which the contents of your stomach are moved into your small intestine). Muscle rigidity. Immune system and hormonal dysfunction. Quick, involuntary muscle jerks (myoclonus). Arrhythmia. Itchy skin (pruritus). Dry mouth (xerostomia).  Long-term side effects: Chronic constipation. Sleep-disordered breathing (SDB). Increased risk of bone fractures. Hypothalamic-pituitary-adrenal dysregulation. Increased risk of overdose.  RISKS: Respiratory depression and death: Opioids increase the risk of respiratory depression and death.  Drug-to-drug interactions: Opioids are relatively contraindicated in combination with benzodiazepines, sleep inducers, and other central nervous system depressants. Other classes of medications (i.e.: certain antibiotics and even over-the-counter medications) may also trigger or induce respiratory depression in some patients.  Medical conditions: Patients with pre-existing respiratory problems are at higher risk of respiratory failure and/or depression when in combination with opioid analgesics. Opioids are relatively  contraindicated in some medical conditions such as central sleep apnea.   Fractures and Falls:  Opioids increase the risk and incidence of falls. This is of particular importance in elderly patients.  Endocrine System:  Long-term administration is associated with endocrine abnormalities (endocrinopathies). (Also known as Opioid-induced Endocrinopathy) Influences on both the hypothalamic-pituitary-adrenal axis?and the hypothalamic-pituitary-gonadal axis have been demonstrated with consequent hypogonadism and adrenal insufficiency in both sexes. Hypogonadism and decreased levels of dehydroepiandrosterone sulfate have been reported in men and women. Endocrine effects include: Amenorrhoea in women (abnormal absence of menstruation) Reduced libido in both sexes Decreased sexual function Erectile dysfunction  in men Hypogonadisms (decreased testicular function with shrinkage of testicles) Infertility Depression and fatigue Loss of muscle mass Anxiety Depression Immune suppression Hyperalgesia Weight gain Anemia Osteoporosis Patients (particularly women of childbearing age) should avoid opioids. There is insufficient evidence to recommend routine monitoring of asymptomatic patients taking opioids in the long-term for hormonal deficiencies.  Immune System: Human studies have demonstrated that opioids have an immunomodulating effect. These effects are mediated via opioid receptors both on immune effector cells and in the central nervous system. Opioids have been demonstrated to have adverse effects on antimicrobial response and anti-tumour surveillance. Buprenorphine has been demonstrated to have no impact on immune function.  Opioid Induced Hyperalgesia: Human studies have demonstrated that prolonged use of opioids can lead to a state of abnormal pain sensitivity, sometimes called opioid induced hyperalgesia (OIH). Opioid induced hyperalgesia is not usually seen in the absence of tolerance  to opioid analgesia. Clinically, hyperalgesia may be diagnosed if the patient on long-term opioid therapy presents with increased pain. This might be qualitatively and anatomically distinct from pain related to disease progression or to breakthrough pain resulting from development of opioid tolerance. Pain associated with hyperalgesia tends to be more diffuse than the pre-existing pain and less defined in quality. Management of opioid induced hyperalgesia requires opioid dose reduction.  Cancer: Chronic opioid therapy has been associated with an increased risk of cancer among noncancer patients with chronic pain. This association was more evident in chronic strong opioid users. Chronic opioid consumption causes significant pathological changes in the small intestine and colon. Epidemiological studies have found that there is a link between opium dependence and initiation of gastrointestinal cancers. Cancer is the second leading cause of death after cardiovascular disease. Chronic use of opioids can cause multiple conditions such as GERD, immunosuppression and renal damage as well as carcinogenic effects, which are associated with the incidence of cancers.   Mortality: Long-term opioid use has been associated with increased mortality among patients with chronic non-cancer pain (CNCP).  Prescription of long-acting opioids for chronic noncancer pain was associated with a significantly increased risk of all-cause mortality, including deaths from causes other than overdose.  Reference: Von Korff M, Kolodny A, Deyo RA, Chou R. Long-term opioid therapy reconsidered. Ann Intern Med. 2011 Sep 6;155(5):325-8. doi: 10.7326/[id number]. PMID: 91478295; PMCID: [id number]. Achilles Achilles, Hayward RA, Dunn KM, [place] KP. Risk of adverse events in patients prescribed long-term opioids: A cohort study in the [place] Clinical Practice Research Datalink. Eur J Pain. 2019 May;23(5):908-922.  doi: 10.1002/ejp.1357. Epub 2019 Jan 31. PMID: 46962952. Colameco S, Coren JS, Ciervo CA. Continuous opioid treatment for chronic noncancer pain: a time for moderation in prescribing. Postgrad Med. 2009 Jul;121(4):61-6. doi: 10.3810/pgm.2009.07.2032. PMID: 84132440. Orlan Bis RN, [place] SD, Blazina I, Sheliah Deutscher, Bougatsos C, Deyo RA. The effectiveness and risks of long-term opioid therapy for chronic pain: a systematic review for a [employer] of [employer] Pathways to [employer]. Ann Intern Med. 2015 Feb 17;162(4):276-86. doi: 10.7326/M14-2559. PMID: 10272536. Dawson Europe [facility], Makuc DM. NCHS Data Brief No. 22. [place]: Centers for Disease Control and Prevention; 2009. Sep, Increase in Fatal Poisonings Involving Opioid Analgesics in the [place] , 1999-2006. Song IA, Choi HR, Oh TK. Long-term opioid use and mortality in patients with chronic non-cancer pain: Ten-year follow-up study in [place] from 2010 through 2019. EClinicalMedicine. 2022 Jul 18;51:101558. doi: 10.1016/j.eclinm.2022.644034. PMID: 74259563; PMCID: [id number]. Huser, WAleta Anda, T., Vogelmann, T. et al. All-cause mortality in patients with long-term opioid  therapy compared with non-opioid analgesics for chronic non-cancer pain: a database study. BMC Med 18, 162 (2020). http://lester.info/ Rashidian H, Zendehdel K, Kamangar F, Malekzadeh R, Haghdoost AA. An Ecological Study of the Association between Opiate Use and Incidence of Cancers. Addict Health. 2016 Fall;8(4):252-260. PMID: 16109604; PMCID: [id number].  Our Goal: Our goal is to control your pain with means other than the use of opioid pain medications.  Our Recommendation: Talk to your physician about coming off of these medications. We can assist you with the tapering down and stopping these medicines. Based on the new information, even if you cannot completely stop the medication, a decrease in the dose  may be associated with a lesser risk. Ask for other means of controlling the pain. Decrease or eliminate those factors that significantly contribute to your pain such as smoking, obesity, and a diet heavily tilted towards "inflammatory" nutrients.  Last Updated: 07/29/2022   ______________________________________________________________________       ______________________________________________________________________    National Pain Medication Shortage  The [place] is experiencing worsening drug shortages. These have had a negative widespread effect on patient care and treatment. Not expected to improve any time soon. Predicted to last past 2029.   Drug shortage list (generic names) Oxycodone  IR Oxycodone /APAP Oxymorphone IR Hydromorphone Hydrocodone /APAP Morphine  Where is the problem?  Manufacturing and supply level.  Will this shortage affect you?  Only if you take any of the above pain medications.  How? You may be unable to fill your prescription.  Your pharmacist may offer a "partial fill" of your prescription. (Warning: Do not accept partial fills.) Prescriptions partially filled cannot be transferred to another pharmacy. Read our Medication Rules and Regulation. Depending on how much medicine you are dependent on, you may experience withdrawals when unable to get the medication.  Recommendations: Consider ending your dependence on opioid pain medications. Ask your pain specialist to assist you with the process. Consider switching to a medication currently not in shortage, such as Buprenorphine. Talk to your pain specialist about this option. Consider decreasing your pain medication requirements by managing tolerance thru "Drug Holidays". This may help minimize withdrawals, should you run out of medicine. Control your pain thru the use of non-pharmacological interventional therapies.   Your prescriber: Prescribers cannot be blamed for shortages. Medication  manufacturing and supply issues cannot be fixed by the prescriber.   NOTE: The prescriber is not responsible for supplying the medication, or solving supply issues. Work with your pharmacist to solve it. The patient is responsible for the decision to take or continue taking the medication and for identifying and securing a legal supply source. By law, supplying the medication is the job and responsibility of the pharmacy. The prescriber is responsible for the evaluation, monitoring, and prescribing of these medications.   Prescribers will NOT: Re-issue prescriptions that have been partially filled. Re-issue prescriptions already sent to a pharmacy.  Re-send prescriptions to a different pharmacy because yours did not have your medication. Ask pharmacist to order more medicine or transfer the prescription to another pharmacy. (Read below.)  New 2023 regulation: "September 21, 2021 Revised Regulation Allows DEA-Registered Pharmacies to Transfer Electronic Prescriptions at a Patient's Request DEA Headquarters Division - Public Information Office Patients now have the ability to request their electronic prescription be transferred to another pharmacy without having to go back to their practitioner to initiate the request. This revised regulation went into effect on Monday, September 17, 2021.     At a patient's request, a DEA-registered retail pharmacy can  now transfer an electronic prescription for a controlled substance (schedules II-V) to another DEA-registered retail pharmacy. Prior to this change, patients would have to go through their practitioner to cancel their prescription and have it re-issued to a different pharmacy. The process was taxing and time consuming for both patients and practitioners.    The Drug Enforcement Administration [facility]) published its intent to revise the process for transferring electronic prescriptions on December 10, 2019.  The final rule was published in the federal  register on August 16, 2021 and went into effect 30 days later.  Under the final rule, a prescription can only be transferred once between pharmacies, and only if allowed under existing state or other applicable law. The prescription must remain in its electronic form; may not be altered in any way; and the transfer must be communicated directly between two licensed pharmacists. It's important to note, any authorized refills transfer with the original prescription, which means the entire prescription will be filled at the same pharmacy".  Reference: HugeHand.is [facility] website announcement)  [email or web address].pdf Financial planner of Justice)   [employer] / Vol. 88, No. 143 / Thursday, August 16, 2021 / Rules and Regulations DEPARTMENT OF JUSTICE  Drug Enforcement Administration  21 CFR Part 1306  [Docket No. [id number]]  RIN [id number] Transfer of Electronic Prescriptions for Schedules II-V Controlled Substances Between Pharmacies for Initial Filling  ______________________________________________________________________       ______________________________________________________________________    Transfer of Pain Medication between Pharmacies  Re: 2023 DEA Clarification on existing regulation  Published on DEA Website: September 21, 2021  Title: Revised Regulation Allows DEA-Registered Pharmacies to Electrical engineer Prescriptions at a Patient's Request DEA Headquarters Division - [employer]  "Patients now have the ability to request their electronic prescription be transferred to another pharmacy without having to go back to their practitioner to initiate the request. This revised regulation went into effect on Monday, September 17, 2021.     At a patient's request, a DEA-registered retail pharmacy can now  transfer an electronic prescription for a controlled substance (schedules II-V) to another DEA-registered retail pharmacy. Prior to this change, patients would have to go through their practitioner to cancel their prescription and have it re-issued to a different pharmacy. The process was taxing and time consuming for both patients and practitioners.    The Drug Enforcement Administration [facility]) published its intent to revise the process for transferring electronic prescriptions on December 10, 2019.  The final rule was published in the federal register on August 16, 2021 and went into effect 30 days later.  Under the final rule, a prescription can only be transferred once between pharmacies, and only if allowed under existing state or other applicable law. The prescription must remain in its electronic form; may not be altered in any way; and the transfer must be communicated directly between two licensed pharmacists. It's important to note, any authorized refills transfer with the original prescription, which means the entire prescription will be filled at the same pharmacy."    REFERENCES: 1. DEA website announcement HugeHand.[email or web address]  2. Department of Justice website  CheapWipes.at.pdf  3. DEPARTMENT OF JUSTICE Drug Enforcement Administration 21 CFR Part 1306 [Docket No. [id number]] RIN [id number] "Transfer of Electronic Prescriptions for Schedules II-V Controlled Substances Between Pharmacies for Initial Filling"  ______________________________________________________________________       ______________________________________________________________________    Medication Rules  Purpose: To inform patients, and their family members, of our medication rules and regulations.  Applies to: All patients receiving  prescriptions from our practice  (written or electronic).  Pharmacy of record: This is the pharmacy where your electronic prescriptions will be sent. Make sure we have the correct one.  Electronic prescriptions: In compliance with the [place]  Strengthen Opioid Misuse Prevention (STOP) Act of 2017 (Session Law 2017-74/H243), effective January 21, 2018, all controlled substances must be electronically prescribed. Written prescriptions, faxing, or calling prescriptions to a pharmacy will no longer be done.  Prescription refills: These will be provided only during in-person appointments. No medications will be renewed without a "face-to-face" evaluation with your provider. Applies to all prescriptions.  NOTE: The following applies primarily to controlled substances (Opioid* Pain Medications).   Type of encounter (visit): For patients receiving controlled substances, face-to-face visits are required. (Not an option and not up to the patient.)  Patient's Responsibilities: Pain Pills: Bring all pain pills to every appointment (except for procedure appointments). Pill counts are required.  Pill Bottles: Bring pills in original pharmacy bottle. Bring bottle, even if empty. Always bring the bottle of the most recent fill.  Medication refills: You are responsible for knowing and keeping track of what medications you are taking and when is it that you will need a refill. The day before your appointment: write a list of all prescriptions that need to be refilled. The day of the appointment: give the list to the admitting nurse. Prescriptions will be written only during appointments. No prescriptions will be written on procedure days. If you forget a medication: it will not be "Called in", "Faxed", or "electronically sent". You will need to get another appointment to get these prescribed. No early refills. Do not call asking to have your prescription filled early. Partial  or short prescriptions: Occasionally your pharmacy may not have  enough pills to fill your prescription.  NEVER ACCEPT a partial fill or a prescription that is short of the total amount of pills that you were prescribed.  With controlled substances the law allows 72 hours for the pharmacy to complete the prescription.  If the prescription is not completed within 72 hours, the pharmacist will require a new prescription to be written. This means that you will be short on your medicine and we WILL NOT send another prescription to complete your original prescription.  Instead, request the pharmacy to send a carrier to a nearby branch to get enough medication to provide you with your full prescription. Prescription Accuracy: You are responsible for carefully inspecting your prescriptions before leaving our office. Have the discharge nurse carefully go over each prescription with you, before taking them home. Make sure that your name is accurately spelled, that your address is correct. Check the name and dose of your medication to make sure it is accurate. Check the number of pills, and the written instructions to make sure they are clear and accurate. Make sure that you are given enough medication to last until your next medication refill appointment. Taking Medication: Take medication as prescribed. When it comes to controlled substances, taking less pills or less frequently than prescribed is permitted and encouraged. Never take more pills than instructed. Never take the medication more frequently than prescribed.  Inform other Doctors: Always inform, all of your healthcare providers, of all the medications you take. Pain Medication from other Providers: You are not allowed to accept any additional pain medication from any other Doctor or Healthcare provider. There are two exceptions to this rule. (see below) In the event that you require additional pain medication, you are responsible for  notifying us , as stated below. Cough Medicine: Often these contain an opioid, such as  codeine  or hydrocodone . Never accept or take cough medicine containing these opioids if you are already taking an opioid* medication. The combination may cause respiratory failure and death. Medication Agreement: You are responsible for carefully reading and following our Medication Agreement. This must be signed before receiving any prescriptions from our practice. Safely store a copy of your signed Agreement. Violations to the Agreement will result in no further prescriptions. (Additional copies of our Medication Agreement are available upon request.) Laws, Rules, & Regulations: All patients are expected to follow all [address] and [employer], Rules, [place] [employer]. Ignorance of the Laws does not constitute a valid excuse.  Illegal drugs and Controlled Substances: The use of illegal substances (including, but not limited to marijuana and its derivatives) and/or the illegal use of any controlled substances is strictly prohibited. Violation of this rule may result in the immediate and permanent discontinuation of any and all prescriptions being written by our practice. The use of any illegal substances is prohibited. Adopted CDC guidelines & recommendations: Target dosing levels will be at or below 60 MME/day. Use of benzodiazepines** is not recommended. Urine Drug testing: Patients taking controlled substances will be required to provide a urine sample upon request. Do not void before coming to your medication management appointments. Hold emptying your bladder until you are admitted. The admitting nurse will inform you if a sample is required. Our practice reserves the right to call you at any time to provide a sample. Once receiving the call, you have 24 hours to comply with request. Not providing a sample upon request may result in termination of medication therapy.  Exceptions: There are only two exceptions to the rule of not receiving pain medications from other Healthcare Providers. Exception  #1 (Emergencies): In the event of an emergency (i.e.: accident requiring emergency care), you are allowed to receive additional pain medication. However, you are responsible for: As soon as you are able, call our office [phone], at any time of the day or night, and leave a message stating your name, the date and nature of the emergency, and the name and dose of the medication prescribed. In the event that your call is answered by a member of our staff, make sure to document and save the date, time, and the name of the person that took your information.  Exception #2 (Planned Surgery): In the event that you are scheduled by another doctor or dentist to have any type of surgery or procedure, you are allowed (for a period no longer than 30 days), to receive additional pain medication, for the acute post-op pain. However, in this case, you are responsible for picking up a copy of our "Post-op Pain Management for Surgeons" handout, and giving it to your surgeon or dentist. This document is available at our office, and does not require an appointment to obtain it. Simply go to our office during business hours (Monday-Thursday from 8:00 AM to 4:00 PM) (Friday 8:00 AM to 12:00 Noon) or if you have a scheduled appointment with us , prior to your surgery, and ask for it by name. In addition, you are responsible for: calling our office [phone], at any time of the day or night, and leaving a message stating your name, name of your surgeon, type of surgery, and date of procedure or surgery. Failure to comply with your responsibilities may result in termination of therapy involving the controlled substances.  Consequences:  Non-compliance with the above rules may result in permanent discontinuation of medication prescription therapy. All patients receiving any type of controlled substance is expected to comply with the above patient responsibilities. Not doing so may result in permanent discontinuation of  medication prescription therapy. Medication Agreement Violation. Following the above rules, including your responsibilities will help you in avoiding a Medication Agreement Violation ("Breaking your Pain Medication Contract").  *Opioid medications include: morphine, codeine , oxycodone , oxymorphone, hydrocodone , hydromorphone, meperidine, tramadol, tapentadol, buprenorphine, fentanyl , methadone. **Benzodiazepine medications include: diazepam (Valium), alprazolam (Xanax), clonazepam (Klonopine), lorazepam (Ativan), clorazepate (Tranxene), chlordiazepoxide (Librium), estazolam (Prosom), oxazepam (Serax), temazepam (Restoril), triazolam (Halcion) (Last updated: 11/13/2022) ______________________________________________________________________      ______________________________________________________________________    Medication Recommendations and Reminders  Applies to: All patients receiving prescriptions (written and/or electronic).  Medication Rules & Regulations: You are responsible for reading, knowing, and following our "Medication Rules" document. These exist for your safety and that of others. They are not flexible and neither are we. Dismissing or ignoring them is an act of "non-compliance" that may result in complete and irreversible termination of such medication therapy. For safety reasons, "non-compliance" will not be tolerated. As with the [place] fundamental legal principle of "ignorance of the law is no defense", we will accept no excuses for not having read and knowing the content of documents provided to you by our practice.  Pharmacy of record:  Definition: This is the pharmacy where your electronic prescriptions will be sent.  We do not endorse any particular pharmacy. It is up to you and your insurance to decide what pharmacy to use.  We do not restrict you in your choice of pharmacy. However, once we write for your prescriptions, we will NOT be re-sending more prescriptions to  fix restricted supply problems created by your pharmacy, or your insurance.  The pharmacy listed in the electronic medical record should be the one where you want electronic prescriptions to be sent. If you choose to change pharmacy, simply notify our nursing staff. Changes will be made only during your regular appointments and not over the phone.  Recommendations: Keep all of your pain medications in a safe place, under lock and key, even if you live alone. We will NOT replace lost, stolen, or damaged medication. We do not accept "Police Reports" as proof of medications having been stolen. After you fill your prescription, take 1 week's worth of pills and put them away in a safe place. You should keep a separate, properly labeled bottle for this purpose. The remainder should be kept in the original bottle. Use this as your primary supply, until it runs out. Once it's gone, then you know that you have 1 week's worth of medicine, and it is time to come in for a prescription refill. If you do this correctly, it is unlikely that you will ever run out of medicine. To make sure that the above recommendation works, it is very important that you make sure your medication refill appointments are scheduled at least 1 week before you run out of medicine. To do this in an effective manner, make sure that you do not leave the office without scheduling your next medication management appointment. Always ask the nursing staff to show you in your prescription , when your medication will be running out. Then arrange for the receptionist to get you a return appointment, at least 7 days before you run out of medicine. Do not wait until you have 1 or 2 pills left, to come in.  This is very poor planning and does not take into consideration that we may need to cancel appointments due to bad weather, sickness, or emergencies affecting our staff. DO NOT ACCEPT A "Partial Fill": If for any reason your pharmacy does not have enough  pills/tablets to completely fill or refill your prescription, do not allow for a "partial fill". The law allows the pharmacy to complete that prescription within 72 hours, without requiring a new prescription. If they do not fill the rest of your prescription within those 72 hours, you will need a separate prescription to fill the remaining amount, which we will NOT provide. If the reason for the partial fill is your insurance, you will need to talk to the pharmacist about payment alternatives for the remaining tablets, but again, DO NOT ACCEPT A PARTIAL FILL, unless you can trust your pharmacist to obtain the remainder of the pills within 72 hours.  Prescription refills and/or changes in medication(s):  Prescription refills, and/or changes in dose or medication, will be conducted only during scheduled medication management appointments. (Applies to both, written and electronic prescriptions.) No refills on procedure days. No medication will be changed or started on procedure days. No changes, adjustments, and/or refills will be conducted on a procedure day. Doing so will interfere with the diagnostic portion of the procedure. No phone refills. No medications will be "called into the pharmacy". No Fax refills. No weekend refills. No Holliday refills. No after hours refills.  Remember:  Business hours are:  Monday to Thursday 8:00 AM to 4:00 PM Provider's Schedule: Renaldo Caroli, MD - Appointments are:  Medication management: Monday and Wednesday 8:00 AM to 4:00 PM Procedure day: Tuesday and Thursday 7:30 AM to 4:00 PM Cephus Collin, MD - Appointments are:  Medication management: Tuesday and Thursday 8:00 AM to 4:00 PM Procedure day: Monday and Wednesday 7:30 AM to 4:00 PM (Last update: 11/13/2021) ______________________________________________________________________

## 2023-05-19 ENCOUNTER — Ambulatory Visit: Payer: Medicaid Other | Attending: Pain Medicine | Admitting: Pain Medicine

## 2023-05-19 ENCOUNTER — Encounter: Payer: Self-pay | Admitting: Pain Medicine

## 2023-05-19 VITALS — BP 133/69 | HR 79 | Temp 97.1°F | Resp 18 | Ht 63.0 in | Wt 138.0 lb

## 2023-05-19 DIAGNOSIS — G894 Chronic pain syndrome: Secondary | ICD-10-CM

## 2023-05-19 DIAGNOSIS — M47812 Spondylosis without myelopathy or radiculopathy, cervical region: Secondary | ICD-10-CM

## 2023-05-19 DIAGNOSIS — M542 Cervicalgia: Secondary | ICD-10-CM | POA: Diagnosis not present

## 2023-05-19 DIAGNOSIS — Z79899 Other long term (current) drug therapy: Secondary | ICD-10-CM | POA: Diagnosis present

## 2023-05-19 DIAGNOSIS — M7918 Myalgia, other site: Secondary | ICD-10-CM | POA: Diagnosis present

## 2023-05-19 DIAGNOSIS — M2550 Pain in unspecified joint: Secondary | ICD-10-CM

## 2023-05-19 DIAGNOSIS — Z79891 Long term (current) use of opiate analgesic: Secondary | ICD-10-CM

## 2023-05-19 DIAGNOSIS — M25512 Pain in left shoulder: Secondary | ICD-10-CM

## 2023-05-19 DIAGNOSIS — M25511 Pain in right shoulder: Secondary | ICD-10-CM | POA: Diagnosis present

## 2023-05-19 DIAGNOSIS — G8929 Other chronic pain: Secondary | ICD-10-CM

## 2023-05-19 DIAGNOSIS — M545 Low back pain, unspecified: Secondary | ICD-10-CM

## 2023-05-19 DIAGNOSIS — M549 Dorsalgia, unspecified: Secondary | ICD-10-CM | POA: Insufficient documentation

## 2023-05-19 MED ORDER — OXYCODONE HCL 5 MG PO TABS
5.0000 mg | ORAL_TABLET | Freq: Two times a day (BID) | ORAL | 0 refills | Status: DC | PRN
Start: 2023-06-25 — End: 2023-08-19

## 2023-05-19 MED ORDER — OXYCODONE HCL 5 MG PO TABS: 5.0000 mg | ORAL_TABLET | Freq: Two times a day (BID) | ORAL | 0 refills | Status: DC | PRN

## 2023-05-19 MED ORDER — OXYCODONE HCL 5 MG PO TABS
5.0000 mg | ORAL_TABLET | Freq: Two times a day (BID) | ORAL | 0 refills | Status: DC | PRN
Start: 2023-05-26 — End: 2023-08-19

## 2023-05-19 NOTE — Progress Notes (Signed)
 Nursing Pain Medication Assessment:  Safety precautions to be maintained throughout the outpatient stay will include: orient to surroundings, keep bed in low position, maintain call bell within reach at all times, provide assistance with transfer out of bed and ambulation.  Medication Inspection Compliance: Pill count conducted under aseptic conditions, in front of the patient. Neither the pills nor the bottle was removed from the patient's sight at any time. Once count was completed pills were immediately returned to the patient in their original bottle.  Medication: Oxycodone  IR Pill/Patch Count:  0 of 60 pills remain Pill/Patch Appearance: Markings consistent with prescribed medication Bottle Appearance: Standard pharmacy container. Clearly labeled. Filled Date: 04 / 05 / 2025 Last Medication intake:  Today

## 2023-05-20 ENCOUNTER — Other Ambulatory Visit: Payer: Self-pay | Admitting: Pulmonary Disease

## 2023-05-20 NOTE — Telephone Encounter (Signed)
 Pt hasn't been seen since May of 2024, no showed/cancelled follow up appts. Needs appt with Dr. Viva Grise to continue medication management.

## 2023-05-22 ENCOUNTER — Encounter: Payer: Self-pay | Admitting: Pain Medicine

## 2023-05-22 LAB — TOXASSURE SELECT 13 (MW), URINE

## 2023-05-22 NOTE — Progress Notes (Addendum)
 Unfortunately, the recent UDS (urine drug screening test) demonstrated the patient to have positive levels of THC (marijuana metabolite).  Because this represents a violation to our medication agreement, we will be discontinuing the use of this controlled substance.  (06/09/2023) addendum: The patient has clarified for almost the source of the [facility] found on her UDS.  She will need to have the UDS repeated but this time along with a CBD to THC ratio and a delta 8 to delta 9 ratio test.

## 2023-06-09 ENCOUNTER — Encounter: Payer: Self-pay | Admitting: Pain Medicine

## 2023-06-09 ENCOUNTER — Other Ambulatory Visit (HOSPITAL_BASED_OUTPATIENT_CLINIC_OR_DEPARTMENT_OTHER): Admitting: Pain Medicine

## 2023-06-09 DIAGNOSIS — R892 Abnormal level of other drugs, medicaments and biological substances in specimens from other organs, systems and tissues: Secondary | ICD-10-CM

## 2023-06-09 DIAGNOSIS — Z79899 Other long term (current) drug therapy: Secondary | ICD-10-CM

## 2023-06-09 NOTE — Progress Notes (Signed)
(  06/09/2023) patient indicates THC in (05/19/2023) UDS to be coming from over-the-counter Gummies taken to help her sleep.

## 2023-06-10 ENCOUNTER — Telehealth: Payer: Self-pay | Admitting: *Deleted

## 2023-06-10 NOTE — Telephone Encounter (Signed)
 Left voicemail asking patient to come to the office to provide urine sample.

## 2023-06-10 NOTE — Telephone Encounter (Signed)
-----   Message from Candi Chafe sent at 06/09/2023  4:12 PM EDT ----- Regarding: UDS needed. Please have the patient come in for a UDS to clarify abnormal results found on 05/19/2023 testing.

## 2023-06-17 ENCOUNTER — Other Ambulatory Visit: Payer: Self-pay | Admitting: Infectious Diseases

## 2023-06-17 DIAGNOSIS — J439 Emphysema, unspecified: Secondary | ICD-10-CM

## 2023-06-17 DIAGNOSIS — I25119 Atherosclerotic heart disease of native coronary artery with unspecified angina pectoris: Secondary | ICD-10-CM

## 2023-06-17 DIAGNOSIS — R942 Abnormal results of pulmonary function studies: Secondary | ICD-10-CM

## 2023-06-17 DIAGNOSIS — F411 Generalized anxiety disorder: Secondary | ICD-10-CM

## 2023-06-17 DIAGNOSIS — Z720 Tobacco use: Secondary | ICD-10-CM

## 2023-06-17 DIAGNOSIS — E7800 Pure hypercholesterolemia, unspecified: Secondary | ICD-10-CM

## 2023-06-17 DIAGNOSIS — R7303 Prediabetes: Secondary | ICD-10-CM

## 2023-06-17 DIAGNOSIS — F332 Major depressive disorder, recurrent severe without psychotic features: Secondary | ICD-10-CM

## 2023-06-17 DIAGNOSIS — G894 Chronic pain syndrome: Secondary | ICD-10-CM

## 2023-06-26 ENCOUNTER — Other Ambulatory Visit: Payer: Self-pay | Admitting: Nurse Practitioner

## 2023-06-26 DIAGNOSIS — G894 Chronic pain syndrome: Secondary | ICD-10-CM

## 2023-06-30 LAB — DELTA-8 / DELTA-9 THC MTB,MS,U
Carboxy-Delta-8-THC: >500 ng/mL
Carboxy-Delta-9-THC: NOT DETECTED ng/mL

## 2023-07-01 ENCOUNTER — Other Ambulatory Visit: Payer: Self-pay

## 2023-07-01 ENCOUNTER — Other Ambulatory Visit: Payer: Self-pay | Admitting: Nurse Practitioner

## 2023-07-01 ENCOUNTER — Telehealth: Payer: Self-pay | Admitting: Pain Medicine

## 2023-07-01 ENCOUNTER — Telehealth: Payer: Self-pay

## 2023-07-01 NOTE — Telephone Encounter (Signed)
 Called patient and explained to her that new prescription would not written because of previously filling half or prescription. Patient with understanding. I informed the patient again about never to have half of the prescription filled.

## 2023-07-01 NOTE — Telephone Encounter (Signed)
 Talked with Dr Naveira about patients current situation. MD stated that he would not fill partial prescriptions. MD  tells the patient not to fill only get partial prescription filled because it would void the remaining amount. This information is also printed on the discharge summary notes.

## 2023-07-01 NOTE — Telephone Encounter (Signed)
 Tanya [name] (Key: [id number]) Rx #: [id number]   Available without authorization.  Drug oxyCODONE  HCl 5MG  tablet   [facility] [place]  Medicaid Electronic PA Form (2017 [place]) Original Claim Info 75 01PLAN LIMITATION EXCEEDED 5 DAYS SUPPLY. 02CALL [phone] OR SUBMIT PA TO WWW. Toma Founds

## 2023-07-01 NOTE — Telephone Encounter (Signed)
 Patient picked up script for partial script. She has completed UDS and results are in. She is asking if Dr Barth Borne can write a script for the remainder of her medications for this month. She spoke to [place] last Thursday about this. Antoine Bathe told her to only take 2 tablets a day so she wouldn't run out. Please advise patient. She has a script to fill on July 4 bu;t will be out of meds tomorrow. Cyndi also knows about this.

## 2023-07-02 ENCOUNTER — Telehealth: Payer: Self-pay

## 2023-07-05 ENCOUNTER — Other Ambulatory Visit: Payer: Self-pay | Admitting: Pain Medicine

## 2023-07-05 DIAGNOSIS — G8929 Other chronic pain: Secondary | ICD-10-CM

## 2023-07-05 DIAGNOSIS — M47812 Spondylosis without myelopathy or radiculopathy, cervical region: Secondary | ICD-10-CM

## 2023-07-05 DIAGNOSIS — M2550 Pain in unspecified joint: Secondary | ICD-10-CM

## 2023-07-05 DIAGNOSIS — G894 Chronic pain syndrome: Secondary | ICD-10-CM

## 2023-07-05 DIAGNOSIS — Z79891 Long term (current) use of opiate analgesic: Secondary | ICD-10-CM

## 2023-07-05 DIAGNOSIS — Z79899 Other long term (current) drug therapy: Secondary | ICD-10-CM

## 2023-07-05 DIAGNOSIS — M542 Cervicalgia: Secondary | ICD-10-CM

## 2023-07-07 NOTE — Telephone Encounter (Signed)
 Tanya [name]

## 2023-07-11 ENCOUNTER — Ambulatory Visit: Admission: RE | Admit: 2023-07-11 | Source: Ambulatory Visit

## 2023-08-04 ENCOUNTER — Ambulatory Visit

## 2023-08-19 ENCOUNTER — Ambulatory Visit

## 2023-08-20 ENCOUNTER — Ambulatory Visit: Attending: Nurse Practitioner | Admitting: Nurse Practitioner

## 2023-08-20 ENCOUNTER — Encounter: Payer: Self-pay | Admitting: Nurse Practitioner

## 2023-08-20 DIAGNOSIS — M25512 Pain in left shoulder: Secondary | ICD-10-CM | POA: Insufficient documentation

## 2023-08-20 DIAGNOSIS — M25511 Pain in right shoulder: Secondary | ICD-10-CM | POA: Insufficient documentation

## 2023-08-20 DIAGNOSIS — G8929 Other chronic pain: Secondary | ICD-10-CM | POA: Diagnosis present

## 2023-08-20 DIAGNOSIS — M47812 Spondylosis without myelopathy or radiculopathy, cervical region: Secondary | ICD-10-CM | POA: Diagnosis present

## 2023-08-20 DIAGNOSIS — G894 Chronic pain syndrome: Secondary | ICD-10-CM | POA: Diagnosis present

## 2023-08-20 DIAGNOSIS — Z79899 Other long term (current) drug therapy: Secondary | ICD-10-CM | POA: Diagnosis present

## 2023-08-20 DIAGNOSIS — M545 Low back pain, unspecified: Secondary | ICD-10-CM | POA: Insufficient documentation

## 2023-08-20 DIAGNOSIS — M7918 Myalgia, other site: Secondary | ICD-10-CM | POA: Diagnosis present

## 2023-08-20 DIAGNOSIS — M2550 Pain in unspecified joint: Secondary | ICD-10-CM | POA: Insufficient documentation

## 2023-08-20 DIAGNOSIS — Z79891 Long term (current) use of opiate analgesic: Secondary | ICD-10-CM | POA: Diagnosis present

## 2023-08-20 DIAGNOSIS — M549 Dorsalgia, unspecified: Secondary | ICD-10-CM | POA: Insufficient documentation

## 2023-08-20 DIAGNOSIS — M542 Cervicalgia: Secondary | ICD-10-CM | POA: Insufficient documentation

## 2023-08-20 MED ORDER — OXYCODONE HCL 5 MG PO TABS: 5.0000 mg | ORAL_TABLET | Freq: Two times a day (BID) | ORAL | 0 refills | Status: AC | PRN

## 2023-08-20 NOTE — Progress Notes (Signed)
 PROVIDER NOTE: Interpretation of information contained herein should be left to medically-trained personnel. Specific patient instructions are provided elsewhere under Patient Instructions section of medical record. This document was created in part using AI and STT-dictation technology, any transcriptional errors that may result from this process are unintentional.  Patient: Tanya [name]  Service: E/M   PCP: Epifanio Alm SQUIBB, MD  DOB: April 04, 1960  DOS: 08/20/2023  Provider: Emmy MARLA Blanch, NP  MRN: [id number]  Delivery: Face-to-face  Specialty: Interventional Pain Management  Type: Established Patient  Setting: Ambulatory outpatient facility  Specialty designation: 09  Referring Prov.: Epifanio Alm SQUIBB, MD  Location: Outpatient office facility       History of present illness (HPI) Tanya [name], a 63 y.o. year old female, is here today because of her No primary diagnosis found.. Tanya [name] primary complain today is Back Pain (Lower back ) and Leg Pain (Bilateral )  Pertinent problems: Tanya [name] does not have any pertinent problems on file.  Pain Assessment: Severity of Chronic pain is reported as a 8 /10. Location: Back Lower, Mid/Lower back into entire legs bilateral down to feet bilateral. Onset: More than a month ago. Quality: Aching, Burning, Constant, Throbbing. Timing: Constant. Modifying factor(s): Pain medication. Vitals:  height is 5' 3 (1.6 m) and weight is 124 lb (56.2 kg). Her temporal temperature is 97.1 F (36.2 C) (abnormal). Her blood pressure is 141/84 (abnormal) and her pulse is 81. Her respiration is 16 and oxygen saturation is 99%.  BMI: Estimated body mass index is 21.97 kg/m as calculated from the following:   Height as of this encounter: 5' 3 (1.6 m).   Weight as of this encounter: 124 lb (56.2 kg).  Last encounter: Visit date not found. Last procedure: Visit date not found.  Reason for encounter: medication management. The patient  indicates doing well with current medication regimen. No side effects or adverse reaction reported to medication.   Pharmacotherapy Assessment   Oxycodone  (Oxy IR/oxycodone ) 5 mg immediate release 2 times daily as needed for pain. MME=15 Monitoring:  PMP: PDMP reviewed during this encounter.       Pharmacotherapy: No side-effects or adverse reactions reported. Compliance: No problems identified. Effectiveness: Clinically acceptable.  Bonner Norris, RN  08/20/2023  1:28 PM  Sign when Signing Visit Nursing Pain Medication Assessment:  Safety precautions to be maintained throughout the outpatient stay will include: orient to surroundings, keep bed in low position, maintain call bell within reach at all times, provide assistance with transfer out of bed and ambulation.    Medication Inspection Compliance: Pill count conducted under aseptic conditions, in front of the patient. Neither the pills nor the bottle was removed from the patient's sight at any time. Once count was completed pills were immediately returned to the patient in their original bottle.  Medication: Oxycodone  IR Pill/Patch Count: 0 of 60 pills/patches remain Pill/Patch Appearance: Markings consistent with prescribed medication Bottle Appearance: Standard pharmacy container. Clearly labeled. Filled Date: 07 / 05 / 2025 Last Medication intake:  Yesterday    UDS:  Summary  Date Value Ref Range Status  05/19/2023 FINAL  Final    Comment:    ==================================================================== [facility] (MW) ==================================================================== Test                             Result       Flag       Units  Drug Present and Declared for  Prescription Verification   Oxymorphone                    161          EXPECTED   ng/mg creat   Noroxycodone                   530          EXPECTED   ng/mg creat    Oxymorphone and noroxycodone are expected metabolites of  oxycodone .    Sources of oxycodone  are scheduled prescription medications.    Oxymorphone is also available as a scheduled prescription medication.  Drug Present not Declared for Prescription Verification   Desmethyldiazepam              155          UNEXPECTED ng/mg creat   Oxazepam                       103          UNEXPECTED ng/mg creat   Temazepam                      285          UNEXPECTED ng/mg creat    Desmethyldiazepam, oxazepam, and temazepam are benzodiazepine drugs,    but may also be present as common metabolites of other    benzodiazepine drugs, including diazepam.    Carboxy-THC                    888          UNEXPECTED ng/mg creat    Carboxy-THC is a metabolite of tetrahydrocannabinol (THC). Source of    THC is most commonly herbal marijuana or marijuana-based products,    but THC is also present in a scheduled prescription medication.    Trace amounts of THC can be present in hemp and cannabidiol (CBD)    products. This test is not intended to distinguish between delta-9-    tetrahydrocannabinol, the predominant form of THC in most herbal or    marijuana-based products, and delta-8-tetrahydrocannabinol.  Drug Absent but Declared for Prescription Verification   Oxycodone                       Not Detected UNEXPECTED ng/mg creat    Oxycodone  is almost always present in patients taking this drug    consistently.  Absence of oxycodone  could be due to lapse of time    since the last dose or unusual pharmacokinetics (rapid metabolism).  ==================================================================== Test                      Result    Flag   Units      Ref Range   Creatinine              33               mg/dL      >=79 ==================================================================== Declared Medications:  The flagging and interpretation on this report are based on the  following declared medications.  Unexpected results may arise from  inaccuracies in the  declared medications.   **Note: The testing scope of this panel includes these medications:   Oxycodone  (Roxicodone )   **Note: The testing scope of this panel does not include the  following reported medications:   Albuterol  (Ventolin  HFA)  Amoxicillin  (Augmentin )  Atorvastatin (Lipitor)  Azithromycin  (Zithromax )  Baclofen  (Lioresal )  Clavulanate (Augmentin )  Clobetasol (Temovate)  Fluticasone  (Flonase )  Gabapentin  (Neurontin )  Hydroxychloroquine (Plaquenil)  Hydroxyzine  (Vistaril )  Mycophenolate mofetil (Cellcept)  Naloxone  (Narcan )  Nicotine   Prednisone  (Deltasone )  Vortioxetine (Trintellix) ==================================================================== For clinical consultation, please call [phone]. ====================================================================     No results found for: CBDTHCR Carboxy-Delta-8-THC  Date Value Ref Range Status  06/26/2023 >500.0 ng/mL Final   Carboxy-Delta-9-THC  Date Value Ref Range Status  06/26/2023 Not Detected ng/mL Final    Comment:    Carboxy-Delta-9-THC is the primary metabolite of Delta-9- Tetrahydrocannabinol. Sources include the prescription medication Dronabinol as well as illicit, recreational, and medical marijuana and marijuana derived products of the same categories.  Carboxy-Delta-8-THC is the primary metabolite of Delta-8- Tetrahydrocannabinol. Sources are products containing Delta- 8-THC, which is primarily chemically manufactured from cannabidiol (CBD).  Testing Threshold = 2.0 ng/mL  Analysis performed by Liquid Chromatography with Tandem Mass Spectrometry (LC/MS/MS).  This test was developed and its performance characteristics determined by [facility].  It has not been cleared or approved by the Food and Drug Administration.     ROS  Constitutional: Denies any fever or chills Gastrointestinal: No reported hemesis, hematochezia, vomiting, or acute GI distress Musculoskeletal:  Low back pain, bilateral leg pain Neurological: No reported episodes of acute onset apraxia, aphasia, dysarthria, agnosia, amnesia, paralysis, loss of coordination, or loss of consciousness  Medication Review  Cannabidiol, DULoxetine , albuterol , atorvastatin, baclofen , clobetasol cream, gabapentin , hydrOXYzine , hydroxychloroquine, naloxone , nicotine , and oxyCODONE   History Review  Allergy: Tanya [name] is allergic to tessalon  [benzonatate ], pregabalin, trazodone, bupropion, methocarbamol, and tramadol. Drug: Tanya [name]  reports no history of drug use. Alcohol:  reports no history of alcohol use. Tobacco:  reports that she has been smoking cigarettes. She has a 35 pack-year smoking history. She has been exposed to tobacco smoke. She has never used smokeless tobacco. Social: Tanya [name]  reports that she has been smoking cigarettes. She has a 35 pack-year smoking history. She has been exposed to tobacco smoke. She has never used smokeless tobacco. She reports that she does not drink alcohol and does not use drugs. Medical:  has a past medical history of Anxiety, Bladder infection, Chronic lower back pain, Chronic neck pain, Collagen vascular disease (HCC), COVID-19, Depression, Diverticulitis, GERD (gastroesophageal reflux disease), Hyperlipidemia, Lupus, and Overactive bladder. Surgical: Tanya [name]  has a past surgical history that includes Abdominal hysterectomy; Colonoscopy; Colonoscopy with propofol  (N/A, 07/03/2017); Tonsillectomy; Esophagogastroduodenoscopy (N/A, 10/21/2017); and Colonoscopy with propofol  (N/A, 10/21/2017). Family: family history includes Alcohol abuse in her mother; Cancer in her mother; Depression in her mother; Diabetes in her brother; Drug abuse in her son; Emphysema in her mother; Glaucoma in her father; Heart disease in her father; Hypertension in her mother; Stroke in her mother.  Laboratory Chemistry Profile   Renal Lab Results  Component Value Date   BUN  16 01/10/2023   CREATININE 0.79 01/10/2023   BCR 15 09/03/2017   GFRAA >60 06/09/2019   GFRNONAA >60 01/10/2023    Hepatic Lab Results  Component Value Date   AST 26 06/09/2019   ALT 20 06/09/2019   ALBUMIN 4.1 06/09/2019   ALKPHOS 29 (L) 06/09/2019   LIPASE 35 06/09/2019    Electrolytes Lab Results  Component Value Date   NA 138 01/10/2023   K 3.9 01/10/2023   CL 103 01/10/2023   CALCIUM 9.4 01/10/2023   MG 1.9 09/03/2017    Bone Lab Results  Component Value Date   25OHVITD1 25 (  L) 09/03/2017   25OHVITD2 2.9 09/03/2017   25OHVITD3 22 09/03/2017    Inflammation (CRP: Acute Phase) (ESR: Chronic Phase) Lab Results  Component Value Date   CRP 2 09/03/2017   ESRSEDRATE 21 09/03/2017         Note: Above Lab results reviewed.  Recent Imaging Review  CT CHEST WO CONTRAST CLINICAL DATA:  Follow-up lung nodule  EXAM: CT CHEST WITHOUT CONTRAST  TECHNIQUE: Multidetector CT imaging of the chest was performed following the standard protocol without IV contrast.  RADIATION DOSE REDUCTION: This exam was performed according to the departmental dose-optimization program which includes automated exposure control, adjustment of the mA and/or kV according to patient size and/or use of iterative reconstruction technique.  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 Thyroid  gland, trachea, and esophagus demonstrate no significant findings.  Lungs/Pleura: Comparison with prior examination accounting for differences in techniques there is no significant change in the ground-glass appearance parenchymal density of the left upper lobe measuring 2.5 by 1.9 cm  in maximum diameter AP and transverse diameter.  Comparison with prior examination no significant change in the ill-defined ground-glass appearance of both lungs which correlates with chronic interstitial lung disease, and COPD changes  No other pulmonary nodules or masses.  Upper Abdomen: No acute abnormality. No change in the multiple hepatic cysts compared with priors  Musculoskeletal: No chest wall mass or suspicious bone lesions identified.  IMPRESSION: *No acute findings. 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 *No change in the left upper lobe parenchymal ground-glass appearing compared with prior examinations. Level of suspicion for bronchogenic carcinoma or alveolar cell carcinoma remains unchanged.  Electronically Signed   By: Franky Chard M.D.   On: 04/14/2023 14:06 Note: Reviewed        Physical Exam  Vitals: BP (!) 141/84 (Patient Position: Sitting, Cuff Size: Normal)   Pulse 81   Temp (!) 97.1 F (36.2 C) (Temporal)   Resp 16   Ht 5' 3 (1.6 m)   Wt 124 lb (56.2 kg)   SpO2 99%   BMI 21.97 kg/m  BMI: Estimated body mass index is 21.97 kg/m as calculated from the following:   Height as of this encounter: 5' 3 (1.6 m).   Weight as of this encounter: 124 lb (56.2 kg). Ideal: Ideal body weight: 52.4 kg (115 lb 8.3 oz) Adjusted ideal body weight: 53.9 kg (118 lb 14.6 oz) General appearance: Well nourished, well developed, and well hydrated. In no apparent acute distress Mental status: Alert, oriented x 3 (person, place, & time)       Respiratory: No evidence of acute respiratory distress Eyes: PERLA   Assessment   Diagnosis Status  1. Chronic neck pain (1ry area of Pain) (Bilateral) (R>L)   2. Chronic upper back pain (2ry area of Pain) (Bilateral) (R>L)   3. Chronic low back pain (3ry area of Pain) (Bilateral) (R>L)  w/o sciatica   4. Cervical spine pain   5. Cervical facet syndrome (Bilateral) (R>L)   6. Cervicalgia   7. Chronic shoulder pain (Bilateral) (R>L)   8. Chronic generalized arthralgias of multiple sites   9. Chronic musculoskeletal  pain   10. Chronic pain syndrome   11. Pharmacologic therapy   12. Chronic use of opiate for therapeutic purpose   13. Encounter for medication management   14. Encounter for chronic pain management    Controlled Controlled Controlled   Updated Problems: No problems updated.  Plan of Care  Problem-specific:  Assessment and Plan We will continue on current medication regimen.  Prescription drug monitoring (PDMP) reviewed; findings consistent with the use of prescribed medication and no evidence of narcotic misuse or abuse.  Urine drug screening (UDS) up-to-date.  No other new issues or problems reported to this visit.  Schedule follow-up in 90 days for medication management.  Tanya [name] has a current medication list which includes the following long-term medication(s): gabapentin , ventolin  hfa, [START ON 08/25/2023] oxycodone , [START ON 09/24/2023] oxycodone , and [START ON 10/24/2023] oxycodone .  Pharmacotherapy (Medications Ordered): Meds ordered this encounter  Medications   oxyCODONE  (OXY IR/ROXICODONE ) 5 MG immediate release tablet    Sig: Take 1 tablet (5 mg total) by mouth 2 (two) times daily as needed for severe pain (pain score 7-10). Must last 30 days    Dispense:  60 tablet    Refill:  0    DO NOT: delete (not duplicate); no partial-fill (will deny script to complete), no refill request (F/U required). DISPENSE: 1 day early if closed on fill date. WARN: No CNS-depressants within 8 hrs of med.   oxyCODONE  (OXY IR/ROXICODONE ) 5 MG immediate release tablet    Sig: Take 1 tablet (5 mg total) by mouth 2 (two) times daily as needed for severe pain (pain score 7-10). Must last 30 days    Dispense:  60 tablet    Refill:  0    DO NOT: delete  (not duplicate); no partial-fill (will deny script to complete), no refill request (F/U required). DISPENSE: 1 day early if closed on fill date. WARN: No CNS-depressants within 8 hrs of med.   oxyCODONE  (OXY IR/ROXICODONE ) 5 MG immediate release tablet    Sig: Take 1 tablet (5 mg total) by mouth 2 (two) times daily as needed for severe pain (pain score 7-10). Must last 30 days    Dispense:  60 tablet    Refill:  0    DO NOT: delete (not duplicate); no partial-fill (will deny script to complete), no refill request (F/U required). DISPENSE: 1 day early if closed on fill date. WARN: No CNS-depressants within 8 hrs of med.   Orders:  No orders of the defined types were placed in this encounter.       Return in about 3 months (around 11/20/2023) for (F2F), (MM), Emmy Blanch NP.    Recent Visits No visits were found meeting these conditions. Showing recent visits within past 90 days and meeting all other requirements Today's Visits Date Type Provider Dept  08/20/23 Office Visit Kristi Norment K, NP [facility]  Showing today's visits and meeting all other requirements Future Appointments No visits were found meeting these conditions. Showing future appointments within next 90 days and meeting all other requirements  I discussed the assessment and treatment plan with the patient. The patient was provided an opportunity to ask questions and all were answered. The patient agreed with the plan and demonstrated an understanding of the instructions.  Patient advised to call back or seek an in-person evaluation if the symptoms or condition worsens.  Duration of encounter: 30 minutes.  Total time on encounter, as per AMA guidelines included both the face-to-face  and non-face-to-face time personally spent by the physician and/or other qualified health care professional(s) on the day of the encounter (includes time in activities that require the physician or other qualified health care  professional and does not include time in activities normally performed by clinical staff). Physician's time may include the following activities when performed: Preparing to see the patient (e.g., pre-charting review of records, searching for previously ordered imaging, lab work, and nerve conduction tests) Review of prior analgesic pharmacotherapies. Reviewing PMP Interpreting ordered tests (e.g., lab work, imaging, nerve conduction tests) Performing post-procedure evaluations, including interpretation of diagnostic procedures Obtaining and/or reviewing separately obtained history Performing a medically appropriate examination and/or evaluation Counseling and educating the patient/family/caregiver Ordering medications, tests, or procedures Referring and communicating with other health care professionals (when not separately reported) Documenting clinical information in the electronic or other health record Independently interpreting results (not separately reported) and communicating results to the patient/ family/caregiver Care coordination (not separately reported)  Note by: Karsten Howry K Leopoldo Mazzie, NP (TTS and AI technology used. I apologize for any typographical errors that were not detected and corrected.) Date: 08/20/2023; Time: 1:48 PM

## 2023-08-20 NOTE — Progress Notes (Signed)
 Nursing Pain Medication Assessment:  Safety precautions to be maintained throughout the outpatient stay will include: orient to surroundings, keep bed in low position, maintain call bell within reach at all times, provide assistance with transfer out of bed and ambulation.    Medication Inspection Compliance: Pill count conducted under aseptic conditions, in front of the patient. Neither the pills nor the bottle was removed from the patient's sight at any time. Once count was completed pills were immediately returned to the patient in their original bottle.  Medication: Oxycodone  IR Pill/Patch Count: 0 of 60 pills/patches remain Pill/Patch Appearance: Markings consistent with prescribed medication Bottle Appearance: Standard pharmacy container. Clearly labeled. Filled Date: 07 / 05 / 2025 Last Medication intake:  Yesterday

## 2023-08-20 NOTE — Patient Instructions (Signed)
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
## 2023-08-25 ENCOUNTER — Encounter: Payer: Self-pay | Admitting: Psychiatry

## 2023-08-25 ENCOUNTER — Ambulatory Visit: Admitting: Psychiatry

## 2023-08-25 ENCOUNTER — Other Ambulatory Visit: Payer: Self-pay

## 2023-08-25 VITALS — BP 133/78 | HR 69 | Temp 97.2°F | Ht 63.0 in | Wt 126.8 lb

## 2023-08-25 DIAGNOSIS — F419 Anxiety disorder, unspecified: Secondary | ICD-10-CM | POA: Diagnosis not present

## 2023-08-25 DIAGNOSIS — F331 Major depressive disorder, recurrent, moderate: Secondary | ICD-10-CM

## 2023-08-25 NOTE — Patient Instructions (Signed)
 Continue duloxetine  30 mg daily  Obtain lab (TSH) at [facility] Next appointment- 9/15 at 2:30
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
## 2023-10-06 ENCOUNTER — Encounter: Payer: Self-pay | Admitting: Psychiatry

## 2023-10-06 ENCOUNTER — Other Ambulatory Visit
Admission: RE | Admit: 2023-10-06 | Discharge: 2023-10-06 | Disposition: A | Discharge status: Home / Self Care | Source: Ambulatory Visit | Attending: Psychiatry | Admitting: Psychiatry

## 2023-10-06 ENCOUNTER — Other Ambulatory Visit: Payer: Self-pay

## 2023-10-06 ENCOUNTER — Ambulatory Visit (INDEPENDENT_AMBULATORY_CARE_PROVIDER_SITE_OTHER): Admitting: Psychiatry

## 2023-10-06 ENCOUNTER — Ambulatory Visit: Payer: Self-pay | Admitting: Psychiatry

## 2023-10-06 VITALS — BP 118/71 | HR 62 | Temp 95.7°F | Ht 63.0 in | Wt 124.4 lb

## 2023-10-06 DIAGNOSIS — F331 Major depressive disorder, recurrent, moderate: Secondary | ICD-10-CM

## 2023-10-06 DIAGNOSIS — F419 Anxiety disorder, unspecified: Secondary | ICD-10-CM

## 2023-10-06 LAB — TSH: TSH: 3.456 u[IU]/mL (ref 0.350–4.500)

## 2023-10-06 MED ORDER — DULOXETINE HCL 20 MG PO CPEP: 40.0000 mg | ORAL_CAPSULE | Freq: Every day | ORAL | 1 refills | Status: DC

## 2023-10-06 NOTE — Patient Instructions (Signed)
 Increase duloxetine  40 mg daily  Continue hydroxyzine  25 mg twice a day as needed for anxiety [facility] Next appointment- 11/10 at 4:30

## 2023-10-23 ENCOUNTER — Other Ambulatory Visit: Payer: Self-pay | Admitting: Nurse Practitioner

## 2023-10-23 DIAGNOSIS — M47812 Spondylosis without myelopathy or radiculopathy, cervical region: Secondary | ICD-10-CM

## 2023-10-23 DIAGNOSIS — G894 Chronic pain syndrome: Secondary | ICD-10-CM

## 2023-10-23 DIAGNOSIS — Z79891 Long term (current) use of opiate analgesic: Secondary | ICD-10-CM

## 2023-10-23 DIAGNOSIS — M542 Cervicalgia: Secondary | ICD-10-CM

## 2023-10-23 DIAGNOSIS — M7918 Myalgia, other site: Secondary | ICD-10-CM

## 2023-10-23 DIAGNOSIS — G8929 Other chronic pain: Secondary | ICD-10-CM

## 2023-10-23 DIAGNOSIS — Z79899 Other long term (current) drug therapy: Secondary | ICD-10-CM

## 2023-10-23 DIAGNOSIS — M2550 Pain in unspecified joint: Secondary | ICD-10-CM

## 2023-10-29 ENCOUNTER — Other Ambulatory Visit: Payer: Self-pay | Admitting: Gastroenterology

## 2023-10-29 DIAGNOSIS — R1013 Epigastric pain: Secondary | ICD-10-CM

## 2023-11-01 ENCOUNTER — Ambulatory Visit
Admission: RE | Admit: 2023-11-01 | Discharge: 2023-11-01 | Disposition: A | Discharge status: Home / Self Care | Attending: Emergency Medicine | Admitting: Emergency Medicine

## 2023-11-01 VITALS — BP 153/83 | HR 77 | Temp 97.8°F | Resp 16 | Ht 63.0 in | Wt 124.3 lb

## 2023-11-01 DIAGNOSIS — R300 Dysuria: Secondary | ICD-10-CM | POA: Insufficient documentation

## 2023-11-01 LAB — URINALYSIS, W/ REFLEX TO CULTURE (INFECTION SUSPECTED)
Bilirubin Urine: NEGATIVE
Glucose, UA: NEGATIVE mg/dL
Hgb urine dipstick: NEGATIVE
Ketones, ur: NEGATIVE mg/dL
Leukocytes,Ua: NEGATIVE
Nitrite: NEGATIVE
Protein, ur: NEGATIVE mg/dL
RBC / HPF: NONE SEEN RBC/hpf (ref 0–5)
Specific Gravity, Urine: 1.015 (ref 1.005–1.030)
WBC, UA: NONE SEEN WBC/hpf (ref 0–5)
pH: 8.5 — ABNORMAL HIGH (ref 5.0–8.0)

## 2023-11-01 MED ORDER — METRONIDAZOLE 500 MG PO TABS: 500.0000 mg | ORAL_TABLET | Freq: Two times a day (BID) | ORAL | 0 refills | Status: AC

## 2023-11-01 MED ORDER — PHENAZOPYRIDINE HCL 200 MG PO TABS: 200.0000 mg | ORAL_TABLET | Freq: Three times a day (TID) | ORAL | 0 refills | Status: AC

## 2023-11-01 MED ORDER — FLUCONAZOLE 150 MG PO TABS: 150.0000 mg | ORAL_TABLET | ORAL | 0 refills | Status: AC

## 2023-11-01 NOTE — ED Provider Notes (Signed)
 [facility]    CSN: [id number] Arrival date & time: 11/01/23  1014      History   Chief Complaint Chief Complaint  Patient presents with   Urinary Frequency    HPI AZANA KIESLER is a 63 y.o. female.   HPI  63 year old female with past medical history significant for chronic pain issues, anxiety, GERD, hyperlipidemia, depression, prediabetes, and discoid lupus presents for evaluation of urinary symptoms that started approximately 5 days ago.  She is reporting subjective fevers, urinary urgency and frequency with increased nocturia, bladder pressure, and low back pain.  She feels like her bladder does not completely empty when she urinates and she has to wait while urine drips out.  She has not seen the blood in her urine and she denies any vaginal discharge or itching.  She reports that she and her husband did have sexual intercourse approximately 2 weeks ago for the first time in a long time.  Past Medical History:  Diagnosis Date   Anxiety    Bladder infection    8/18   Chronic lower back pain    Chronic neck pain    Collagen vascular disease    COVID-19    Depression    Diverticulitis    GERD (gastroesophageal reflux disease)    Hyperlipidemia    Lupus    Overactive bladder     Patient Active Problem List   Diagnosis Date Noted   Abnormal drug screen (05/19/2023) 06/09/2023   OTC drug - CBD 06/09/2023   Long-term use of immunosuppressant medication 01/23/2023   Chronic generalized arthralgias of multiple sites 11/20/2022   Cervical facet joint pain 05/21/2022   Chronic anticoagulation (Plaquenil) 05/01/2022   Trigger point with neck pain 10/08/2021   Cervicothoracic interspinous bursitis 10/08/2021   Prediabetes 08/13/2021   MDD (major depressive disorder), recurrent, in full remission 07/19/2021   Moderate episode of recurrent major depressive disorder (HCC) 03/23/2021   Tobacco use disorder 03/23/2021   Insomnia 02/06/2021   At risk for  prolonged QT interval syndrome 02/06/2021   Psychiatric illness 11/01/2020   Chronic use of opiate for therapeutic purpose 06/25/2020   Arthropathy of cervical facet joint (Bilateral) 04/13/2020   Cervical spine pain 04/13/2020   Abnormal MRI, cervical spine (09/20/2015) 04/13/2020   Cervical central spinal stenosis (Multilevel) 04/13/2020   Cervical foraminal stenosis (Right: C3-4) (Bilateral: C4-5, C5-6) 04/13/2020   DDD (degenerative disc disease), cervical 03/14/2020   Chronic shoulder pain (Left) 09/06/2019   Osteoarthritis of shoulder (Left) 09/06/2019   Osteoarthritis of shoulders (Bilateral) (R>L) 10/25/2018   Osteoarthritis of AC (acromioclavicular) joint (Right) 04/02/2018   Chronic acromioclavicular joint pain (Right) 04/02/2018   Chronic shoulder pain (Right) 04/02/2018   Epicondylitis elbow, medial (Left) 04/02/2018   Chronic elbow pain (Left) 03/25/2018   Occipital headache (Left) 12/10/2017   Neurogenic pain 12/10/2017   Cervico-occipital neuralgia (Left) 12/10/2017   Pharmacologic therapy 09/01/2017   Disorder of skeletal system 09/01/2017   Problems influencing health status 09/01/2017   Spondylosis without myelopathy or radiculopathy, cervical region 08/05/2017   Cervicalgia 08/05/2017   Rash 05/13/2017   Cervical spondylosis 04/15/2016   Cervical facet syndrome (Bilateral) (R>L) 04/15/2016   Shoulder radicular pain (Bilateral) (R>L) 04/15/2016   Chronic musculoskeletal pain 03/07/2016   Vitamin D  insufficiency 12/19/2015   Anxiety 11/23/2015   Chronic low back pain (3ry area of Pain) (Bilateral) (R>L) w/o sciatica 11/23/2015   Chronic neck pain (1ry area of Pain) (Bilateral) (R>L) 11/23/2015   Long term prescription  benzodiazepine use 11/23/2015   Long term current use of opiate analgesic 11/23/2015   Long term prescription opiate use 11/23/2015   Opiate use (10 MME/Day) 11/23/2015   Chronic pain syndrome 11/23/2015   Chronic upper extremity pain (Right)  11/23/2015   Chronic cervical radicular pain (Right) 11/23/2015   Chronic shoulder pain (Bilateral) (R>L) 11/23/2015   Hypercholesterolemia 06/20/2015   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   Encounter for long-term (current) use of high-risk medication 06/06/2015   Pain medication agreement signed 11/24/2014   Hyperlipidemia 01/25/2014   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   Diverticulitis 10/04/2012   Osteoarthritis 10/04/2012   Chronic upper back pain (2ry area of Pain) (Bilateral) (R>L) 08/17/2012   Urinary incontinence 08/17/2012   Fibromyalgia 06/04/2012   SLE (systemic lupus erythematosus) (HCC) 02/18/2012   GAD (generalized anxiety disorder) 01/07/2012   Major depressive disorder, recurrent 09/29/2009    Past Surgical History:  Procedure Laterality Date   ABDOMINAL HYSTERECTOMY     COLONOSCOPY     COLONOSCOPY WITH PROPOFOL  N/A 07/03/2017   Procedure: COLONOSCOPY WITH PROPOFOL ;  Surgeon: Gaylyn Gladis PENNER, MD;  Location: [facility] ENDOSCOPY;  Service: Endoscopy;  Laterality: N/A;   COLONOSCOPY WITH PROPOFOL  N/A 10/21/2017   Procedure: COLONOSCOPY WITH PROPOFOL ;  Surgeon: Gaylyn Gladis PENNER, MD;  Location: [facility] ENDOSCOPY;  Service: Endoscopy;  Laterality: N/A;   ESOPHAGOGASTRODUODENOSCOPY N/A 10/21/2017   Procedure: ESOPHAGOGASTRODUODENOSCOPY (EGD);  Surgeon: Gaylyn Gladis PENNER, MD;  Location: [facility] ENDOSCOPY;  Service: Endoscopy;  Laterality: N/A;   TONSILLECTOMY      OB History   No obstetric history on file.      Home Medications    Prior to Admission medications   Medication Sig Start Date End Date Taking? Authorizing Provider  atorvastatin (LIPITOR) 80 MG tablet Take 80 mg by mouth every evening. 04/19/20  Yes [provider]  baclofen  (LIORESAL ) 10 MG tablet Take 10 mg by mouth daily. 07/18/23  Yes [provider]  DULoxetine  (CYMBALTA ) 20 MG capsule Take 2 capsules (40 mg total) by mouth daily. 10/06/23 12/05/23 Yes Vickey Mettle, MD  fluconazole  (DIFLUCAN) 150 MG tablet Take 1 tablet (150 mg total) by mouth every 3 (three) days for 3 doses. 11/01/23 11/08/23 Yes Bernardino Ditch, NP  gabapentin  (NEURONTIN ) 300 MG capsule Take 1 capsule (300 mg total) by mouth 3 (three) times daily. 06/26/20 11/01/23 Yes Tanya Glisson, MD  hydroxychloroquine (PLAQUENIL) 200 MG tablet Take 200 mg by mouth in the morning. 01/04/21  Yes [provider]  hydrOXYzine  (VISTARIL ) 25 MG capsule TAKE 1-2 CAPSULES BY MOUTH ONCE DAILY ASNEEDED FOR ANXIETY OR SLEEP 07/15/22  Yes Umrania, Hiren M, MD  metroNIDAZOLE (FLAGYL) 500 MG tablet Take 1 tablet (500 mg total) by mouth 2 (two) times daily. 11/01/23  Yes Bernardino Ditch, NP  oxyCODONE  (OXY IR/ROXICODONE ) 5 MG immediate release tablet Take 1 tablet (5 mg total) by mouth 2 (two) times daily as needed for severe pain (pain score 7-10). Must last 30 days 10/24/23 11/23/23 Yes Patel, Seema K, NP  phenazopyridine (PYRIDIUM) 200 MG tablet Take 1 tablet (200 mg total) by mouth 3 (three) times daily. 11/01/23  Yes Bernardino Ditch, NP  CANNABIDIOL PO Take 1 Piece by mouth at bedtime. Gummies    Requested, Self  clobetasol cream (TEMOVATE) 0.05 % Apply 1 Application topically every 3 (three) days.    [provider]  naloxone  (NARCAN ) nasal spray 4 mg/0.1 mL Place 1 spray into the nose as needed for up to 365 doses (for opioid-induced respiratory depresssion).  In case of emergency (overdose), spray once into each nostril. If no response within 3 minutes, repeat application and call 911. 11/20/22 11/20/23  Tanya Glisson, MD  nicotine  (NICODERM CQ  - DOSED IN MG/24 HOURS) 14 mg/24hr patch Place 1 patch (14 mg total) onto the skin daily. 03/07/22   Eappen, Saramma, MD  oxyCODONE  (OXY IR/ROXICODONE ) 5 MG immediate release tablet Take 1 tablet (5 mg total) by mouth 2 (two) times daily as needed for severe pain (pain score 7-10). Must last 30 days 08/25/23 09/24/23  Patel, Seema K, NP  oxyCODONE  (OXY IR/ROXICODONE ) 5 MG immediate  release tablet Take 1 tablet (5 mg total) by mouth 2 (two) times daily as needed for severe pain (pain score 7-10). Must last 30 days 09/24/23 10/24/23  Patel, Seema K, NP  VENTOLIN  HFA 108 (90 Base) MCG/ACT inhaler INHALE 2 PUFFS INTO THE LUNGS EVERY 6 HOURS AS NEEDED FOR WHEEZING OR SHORTNESS OF BREATH 06/19/22   Tamea Dedra CROME, MD    Family History Family History  Problem Relation Age of Onset   Depression Mother    Alcohol abuse Mother    Cancer Mother    Hypertension Mother    Emphysema Mother    Stroke Mother    Glaucoma Father    Heart disease Father    Diabetes Brother    Drug abuse Son     Social History Social History   Tobacco Use   Smoking status: Every Day    Current packs/day: 1.00    Average packs/day: 1 pack/day for 35.0 years (35.0 ttl pk-yrs)    Types: Cigarettes    Passive exposure: Past   Smokeless tobacco: Never   Tobacco comments:    1ppd - 03/01/2021  Vaping Use   Vaping status: Never Used  Substance Use Topics   Alcohol use: No   Drug use: No     Allergies   Tessalon  [benzonatate ]; Antihistamines, diphenhydramine -type; Pregabalin; Trazodone; Bupropion; Methocarbamol; and Tramadol   Review of Systems Review of Systems  Constitutional:  Positive for fever.  Genitourinary:  Positive for difficulty urinating, frequency and urgency. Negative for hematuria, vaginal discharge and vaginal pain.  Musculoskeletal:  Positive for back pain.     Physical Exam Triage Vital Signs ED Triage Vitals  Encounter Vitals Group     BP      Girls Systolic BP Percentile      Girls Diastolic BP Percentile      Boys Systolic BP Percentile      Boys Diastolic BP Percentile      Pulse      Resp      Temp      Temp src      SpO2      Weight      Height      Head Circumference      Peak Flow      Pain Score      Pain Loc      Pain Education      Exclude from Growth Chart    No data found.  Updated Vital Signs BP (!) 153/83 (BP Location: Right Arm)    Pulse 77   Temp 97.8 F (36.6 C) (Oral)   Resp 16   Ht 5' 3 (1.6 m)   Wt 124 lb 5.4 oz (56.4 kg)   SpO2 97%   BMI 22.03 kg/m   Visual Acuity Right Eye Distance:   Left Eye Distance:   Bilateral Distance:    Right Eye Near:  Left Eye Near:    Bilateral Near:     Physical Exam Vitals and nursing note reviewed.  Constitutional:      Appearance: Normal appearance. She is not ill-appearing.  HENT:     Head: Normocephalic and atraumatic.  Cardiovascular:     Rate and Rhythm: Normal rate and regular rhythm.     Pulses: Normal pulses.     Heart sounds: Normal heart sounds. No murmur heard.    No friction rub. No gallop.  Pulmonary:     Effort: Pulmonary effort is normal.     Breath sounds: Normal breath sounds. No wheezing, rhonchi or rales.  Abdominal:     Tenderness: There is no right CVA tenderness or left CVA tenderness.  Skin:    General: Skin is warm and dry.     Capillary Refill: Capillary refill takes less than 2 seconds.     Findings: No rash.  Neurological:     General: No focal deficit present.     Mental Status: She is alert and oriented to person, place, and time.      UC Treatments / Results  Labs (all labs ordered are listed, but only abnormal results are displayed) Labs Reviewed  URINALYSIS, W/ REFLEX TO CULTURE (INFECTION SUSPECTED) - Abnormal; Notable for the following components:      Result Value   pH 8.5 (*)    Bacteria, UA RARE (*)    All other components within normal limits  CERVICOVAGINAL ANCILLARY ONLY    EKG   Radiology No results found.  Procedures Procedures (including critical care time)  Medications Ordered in UC Medications - No data to display  Initial Impression / Assessment and Plan / UC Course  I have reviewed the triage vital signs and the nursing notes.  Pertinent labs & imaging results that were available during my care of the patient were reviewed by me and considered in my medical decision making (see chart  for details).   Patient is a very pleasant, nontoxic-appearing 64 year old female presenting for evaluation of genitourinary symptoms as outlined HPI above.  She reports that her symptoms began after she and her husband had intercourse 2 weeks ago.  She reports that they infrequently have intercourse.  She also commented that her husband is uncircumcised and is difficult for him to retract his foreskin so she is unsure if he cleaned the area properly.  She reports that she has had some pelvic discomfort since having intercourse 2 weeks ago but the bladder pressure, urgency, frequency, and nocturia started approximately 5 days ago.  She has also had subjective fevers and low back pain.  She has no CVA tenderness on exam.  I advised the patient that the active having sexual intercourse can contribute to urinary tract infections.  Given that her husband is uncircumcised and finds it difficult to retract his foreskin might also have contributed to possible BV or yeast even though she is not having any vaginal itching or discharge.  I will order a urinalysis to assess for the presence of UTI.  If her urinalysis is negative I will order a vaginal cytology swab.  Urinalysis is negative for leukocyte esterase, nitrates, protein, hemoglobin, or glucose.  Reflex microscopy shows rare bacteria.  I will order a vaginal cytology swab to assess for BV or yeast.  I will discharge the patient home and cover presumptively for both BV and yeast with metronidazole and Diflucan.  I also prescribed a Diflucan that she can use for the urinary comfort.  Final Clinical Impressions(s) / UC Diagnoses   Final diagnoses:  Dysuria     Discharge Instructions      Your urinalysis did not show any evidence of urinary tract infection.  As we discussed, you may have contracted BV or vaginal yeast infection as a result of your recent sexual activity.  Take the metronidazole twice daily for 7 days to cover for presumptive BV.   And take the Diflucan to cover for presumptive yeast.  You will take 1 Diflucan tablet now and repeat every 3 days for total of 3 doses.  Your vaginal swab will be back in the next 1 to 2 days.  If you test positive for BV we will contact you and have you stop the Diflucan.  If you test positive for yeast we will contact you and have you stop taking the metronidazole.  You may also use the Pyridium every 8 hours to help with urinary discomfort.  If your swab results are negative I would recommend that you follow-up with your primary care provider or your OB/GYN for further evaluation.     ED Prescriptions     Medication Sig Dispense Auth. Provider   metroNIDAZOLE (FLAGYL) 500 MG tablet Take 1 tablet (500 mg total) by mouth 2 (two) times daily. 14 tablet Bernardino Ditch, NP   fluconazole (DIFLUCAN) 150 MG tablet Take 1 tablet (150 mg total) by mouth every 3 (three) days for 3 doses. 3 tablet Bernardino Ditch, NP   phenazopyridine (PYRIDIUM) 200 MG tablet Take 1 tablet (200 mg total) by mouth 3 (three) times daily. 6 tablet Bernardino Ditch, NP      PDMP not reviewed this encounter.   Bernardino Ditch, NP 11/01/23 1104

## 2023-11-01 NOTE — Discharge Instructions (Addendum)
 Your urinalysis did not show any evidence of urinary tract infection.  As we discussed, you may have contracted BV or vaginal yeast infection as a result of your recent sexual activity.  Take the metronidazole twice daily for 7 days to cover for presumptive BV.  And take the Diflucan to cover for presumptive yeast.  You will take 1 Diflucan tablet now and repeat every 3 days for total of 3 doses.  Your vaginal swab will be back in the next 1 to 2 days.  If you test positive for BV we will contact you and have you stop the Diflucan.  If you test positive for yeast we will contact you and have you stop taking the metronidazole.  You may also use the Pyridium every 8 hours to help with urinary discomfort.  If your swab results are negative I would recommend that you follow-up with your primary care provider or your OB/GYN for further evaluation.

## 2023-11-01 NOTE — ED Triage Notes (Signed)
 Pt c/o lower back pain, urinary frequency, and subjective fever. Started about 5 days ago.

## 2023-11-03 ENCOUNTER — Ambulatory Visit (HOSPITAL_COMMUNITY): Payer: Self-pay

## 2023-11-03 LAB — CERVICOVAGINAL ANCILLARY ONLY
Bacterial Vaginitis (gardnerella): POSITIVE — AB
Candida Glabrata: NEGATIVE
Candida Vaginitis: NEGATIVE
Comment: NEGATIVE
Comment: NEGATIVE
Comment: NEGATIVE

## 2023-11-05 ENCOUNTER — Encounter: Payer: Self-pay | Admitting: *Deleted

## 2023-11-07 ENCOUNTER — Encounter: Payer: Self-pay | Admitting: Pain Medicine

## 2023-11-14 ENCOUNTER — Other Ambulatory Visit: Payer: Self-pay | Admitting: Psychiatry

## 2023-11-17 ENCOUNTER — Telehealth: Payer: Self-pay

## 2023-11-17 NOTE — Telephone Encounter (Signed)
 Medication problem - Called pt back, after she left a message she wanted to discuss a possible medication change with Dr. Vickey. Patient reported no problems with increase to Cymbalta  40 mg but still not doing a lot better, never feeling happy or any motivation.  Patient questioned if trying Zoloft  might work better for her and would like to discuss with Dr. Vickey.  Agreed to send her the message with request to speak to her about a possible change in medication.  Patient denied any suicidal or homicidal ideations, plans, intent, or means to want to harm self or others at this time.

## 2023-11-17 NOTE — Telephone Encounter (Signed)
 I believe she has previously tried sertraline  and other medications. It would be helpful to have a visit to explore alternative treatment options and check in on how she's truly doing.  Please advise her to continue her current medications unless she experiences any side effects. I'll notify the front desk to place her on the waitlist for an earlier appointment.

## 2023-11-17 NOTE — Telephone Encounter (Signed)
 Medication management - Called patient back to follow up with what Dr. Vickey wanted to relay to patient. Patient reported she felt she had tried Sertraline  in the past and explained this was the same as Zoloft . Informed patient Dr. Hisada was going to put her on a call back list in case she gets a cancellation to bring her back sooner so they can discuss other options and patient agreed with this plan.  Patient also greed to call our office back if any worsening of symptoms prior to then as well.

## 2023-11-18 ENCOUNTER — Other Ambulatory Visit: Payer: Self-pay

## 2023-11-18 ENCOUNTER — Other Ambulatory Visit: Payer: Self-pay | Admitting: Psychiatry

## 2023-11-18 ENCOUNTER — Ambulatory Visit (INDEPENDENT_AMBULATORY_CARE_PROVIDER_SITE_OTHER): Admitting: Psychiatry

## 2023-11-18 ENCOUNTER — Encounter: Payer: Self-pay | Admitting: Psychiatry

## 2023-11-18 VITALS — BP 136/68 | HR 64 | Temp 97.0°F | Ht 63.0 in | Wt 126.2 lb

## 2023-11-18 DIAGNOSIS — F331 Major depressive disorder, recurrent, moderate: Secondary | ICD-10-CM | POA: Diagnosis not present

## 2023-11-18 DIAGNOSIS — F419 Anxiety disorder, unspecified: Secondary | ICD-10-CM | POA: Diagnosis not present

## 2023-11-18 MED ORDER — MIRTAZAPINE 15 MG PO TABS: 15.0000 mg | ORAL_TABLET | Freq: Every day | ORAL | 1 refills | Status: DC
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
## 2023-11-18 NOTE — Patient Instructions (Signed)
 Decrease duloxetine  20 mg daily Start mirtazapine  7.5 mg at night for one week, then 15 mg at night Continue hydroxyzine  25 mg twice a day as needed for anxiety Next appointment- 12/15 at 3 pm

## 2023-11-20 ENCOUNTER — Encounter: Admitting: Nurse Practitioner

## 2023-11-20 ENCOUNTER — Other Ambulatory Visit: Payer: Self-pay | Admitting: Infectious Diseases

## 2023-11-20 DIAGNOSIS — Z1231 Encounter for screening mammogram for malignant neoplasm of breast: Secondary | ICD-10-CM

## 2023-11-21 ENCOUNTER — Ambulatory Visit
Admission: RE | Admit: 2023-11-21 | Discharge: 2023-11-21 | Disposition: A | Discharge status: Home / Self Care | Attending: Gastroenterology | Admitting: Gastroenterology

## 2023-11-21 ENCOUNTER — Encounter
Admission: RE | Disposition: A | Discharge status: Home / Self Care | Payer: Self-pay | Source: Home / Self Care | Attending: Gastroenterology

## 2023-11-21 ENCOUNTER — Other Ambulatory Visit: Payer: Self-pay

## 2023-11-21 ENCOUNTER — Ambulatory Visit: Admitting: Anesthesiology

## 2023-11-21 DIAGNOSIS — Z1211 Encounter for screening for malignant neoplasm of colon: Secondary | ICD-10-CM | POA: Diagnosis not present

## 2023-11-21 DIAGNOSIS — G8929 Other chronic pain: Secondary | ICD-10-CM | POA: Insufficient documentation

## 2023-11-21 DIAGNOSIS — K621 Rectal polyp: Secondary | ICD-10-CM | POA: Insufficient documentation

## 2023-11-21 DIAGNOSIS — K5730 Diverticulosis of large intestine without perforation or abscess without bleeding: Secondary | ICD-10-CM | POA: Insufficient documentation

## 2023-11-21 DIAGNOSIS — F17200 Nicotine dependence, unspecified, uncomplicated: Secondary | ICD-10-CM | POA: Diagnosis not present

## 2023-11-21 DIAGNOSIS — D123 Benign neoplasm of transverse colon: Secondary | ICD-10-CM | POA: Insufficient documentation

## 2023-11-21 DIAGNOSIS — F419 Anxiety disorder, unspecified: Secondary | ICD-10-CM | POA: Diagnosis not present

## 2023-11-21 DIAGNOSIS — Z860101 Personal history of adenomatous and serrated colon polyps: Secondary | ICD-10-CM | POA: Diagnosis present

## 2023-11-21 DIAGNOSIS — K640 First degree hemorrhoids: Secondary | ICD-10-CM | POA: Insufficient documentation

## 2023-11-21 DIAGNOSIS — R1013 Epigastric pain: Secondary | ICD-10-CM | POA: Diagnosis present

## 2023-11-21 DIAGNOSIS — K5900 Constipation, unspecified: Secondary | ICD-10-CM | POA: Insufficient documentation

## 2023-11-21 DIAGNOSIS — K2950 Unspecified chronic gastritis without bleeding: Secondary | ICD-10-CM | POA: Diagnosis not present

## 2023-11-21 DIAGNOSIS — J449 Chronic obstructive pulmonary disease, unspecified: Secondary | ICD-10-CM | POA: Diagnosis not present

## 2023-11-21 DIAGNOSIS — K3189 Other diseases of stomach and duodenum: Secondary | ICD-10-CM | POA: Diagnosis not present

## 2023-11-21 DIAGNOSIS — F32A Depression, unspecified: Secondary | ICD-10-CM | POA: Diagnosis not present

## 2023-11-21 DIAGNOSIS — K219 Gastro-esophageal reflux disease without esophagitis: Secondary | ICD-10-CM | POA: Insufficient documentation

## 2023-11-21 HISTORY — PX: POLYPECTOMY: SHX149

## 2023-11-21 HISTORY — PX: ESOPHAGOGASTRODUODENOSCOPY: SHX5428

## 2023-11-21 HISTORY — PX: COLONOSCOPY: SHX5424

## 2023-11-21 SURGERY — COLONOSCOPY
Anesthesia: General

## 2023-11-21 MED ORDER — DEXMEDETOMIDINE HCL IN NACL 80 MCG/20ML IV SOLN
INTRAVENOUS | Status: DC | PRN
  Administered 2023-11-21: 8 ug via INTRAVENOUS
  Administered 2023-11-21: 12 ug via INTRAVENOUS

## 2023-11-21 MED ORDER — LIDOCAINE HCL (CARDIAC) PF 100 MG/5ML IV SOSY
PREFILLED_SYRINGE | INTRAVENOUS | Status: DC | PRN
  Administered 2023-11-21: 40 mg via INTRAVENOUS

## 2023-11-21 MED ORDER — GLYCOPYRROLATE 0.2 MG/ML IJ SOLN
INTRAMUSCULAR | Status: DC | PRN
  Administered 2023-11-21: .2 mg via INTRAVENOUS

## 2023-11-21 MED ORDER — PROPOFOL 10 MG/ML IV BOLUS
INTRAVENOUS | Status: DC | PRN
  Administered 2023-11-21 (×3): 50 mg via INTRAVENOUS

## 2023-11-21 MED ORDER — GLYCOPYRROLATE 0.2 MG/ML IJ SOLN
INTRAMUSCULAR | Status: AC
  Filled 2023-11-21: qty 1

## 2023-11-21 MED ORDER — PROPOFOL 500 MG/50ML IV EMUL
INTRAVENOUS | Status: DC | PRN
  Administered 2023-11-21: 75 ug/kg/min via INTRAVENOUS

## 2023-11-21 MED ORDER — PHENYLEPHRINE 80 MCG/ML (10ML) SYRINGE FOR IV PUSH (FOR BLOOD PRESSURE SUPPORT)
PREFILLED_SYRINGE | INTRAVENOUS | Status: DC | PRN
  Administered 2023-11-21: 160 ug via INTRAVENOUS

## 2023-11-21 MED ORDER — LIDOCAINE HCL (PF) 2 % IJ SOLN
INTRAMUSCULAR | Status: AC
  Filled 2023-11-21: qty 5

## 2023-11-21 MED ORDER — SODIUM CHLORIDE 0.9 % IV SOLN: INTRAVENOUS | Status: DC

## 2023-11-21 NOTE — H&P (Signed)
 Outpatient short stay form Pre-procedure 11/21/2023  Tanya ONEIDA Schick, MD  Primary Physician: Epifanio Alm SQUIBB, MD  Reason for visit:  Epigastric pain/Surveillance  History of present illness:    63 y/o lady with history of chronic pain, lupus, depression, and anxiety here for EGD for epigastric pain and colonoscopy for history of adenomatous polyp on last colonoscopy in 2019. No blood thinners. She does take opioid pain medicine. No abdominal surgeries. No family history of GI malignancies.    Current Facility-Administered Medications:    0.9 %  sodium chloride  infusion, , Intravenous, Continuous, Sharmaine Bain, Tanya ONEIDA, MD, Last Rate: 20 mL/hr at 11/21/23 1213, New Bag at 11/21/23 1213  Facility-Administered Medications Ordered in Other Encounters:    albuterol  (PROVENTIL ) (2.5 MG/3ML) 0.083% nebulizer solution 2.5 mg, 2.5 mg, Nebulization, Once, Tamea Dedra CROME, MD  Medications Prior to Admission  Medication Sig Dispense Refill Last Dose/Taking   atorvastatin (LIPITOR) 80 MG tablet Take 80 mg by mouth every evening.   Past Week   baclofen  (LIORESAL ) 10 MG tablet Take 10 mg by mouth daily.   Past Week   DULoxetine  (CYMBALTA ) 20 MG capsule Take 1 capsule (20 mg total) by mouth daily. 30 capsule 1 11/21/2023 at  8:30 AM   hydroxychloroquine (PLAQUENIL) 200 MG tablet Take 200 mg by mouth in the morning.   Past Week   hydrOXYzine  (VISTARIL ) 25 MG capsule TAKE 1-2 CAPSULES BY MOUTH ONCE DAILY ASNEEDED FOR ANXIETY OR SLEEP 60 capsule 3 Past Week   mirtazapine  (REMERON ) 15 MG tablet Take 1 tablet (15 mg total) by mouth at bedtime. 30 tablet 1 11/20/2023   CANNABIDIOL PO Take 1 Piece by mouth at bedtime. Gummies      clobetasol cream (TEMOVATE) 0.05 % Apply 1 Application topically every 3 (three) days.      gabapentin  (NEURONTIN ) 300 MG capsule Take 1 capsule (300 mg total) by mouth 3 (three) times daily. 90 capsule 2    metroNIDAZOLE (FLAGYL) 500 MG tablet Take 1 tablet (500 mg total)  by mouth 2 (two) times daily. 14 tablet 0    nicotine  (NICODERM CQ  - DOSED IN MG/24 HOURS) 14 mg/24hr patch Place 1 patch (14 mg total) onto the skin daily. 28 patch 1    oxyCODONE  (OXY IR/ROXICODONE ) 5 MG immediate release tablet Take 1 tablet (5 mg total) by mouth 2 (two) times daily as needed for severe pain (pain score 7-10). Must last 30 days 60 tablet 0    oxyCODONE  (OXY IR/ROXICODONE ) 5 MG immediate release tablet Take 1 tablet (5 mg total) by mouth 2 (two) times daily as needed for severe pain (pain score 7-10). Must last 30 days 60 tablet 0    oxyCODONE  (OXY IR/ROXICODONE ) 5 MG immediate release tablet Take 1 tablet (5 mg total) by mouth 2 (two) times daily as needed for severe pain (pain score 7-10). Must last 30 days 60 tablet 0    phenazopyridine (PYRIDIUM) 200 MG tablet Take 1 tablet (200 mg total) by mouth 3 (three) times daily. 6 tablet 0    VENTOLIN  HFA 108 (90 Base) MCG/ACT inhaler INHALE 2 PUFFS INTO THE LUNGS EVERY 6 HOURS AS NEEDED FOR WHEEZING OR SHORTNESS OF BREATH 18 g 2      Allergies  Allergen Reactions   Tessalon  [Benzonatate ] Anaphylaxis   Antihistamines, Diphenhydramine -Type Other (See Comments)    Leg pain   Pregabalin Other (See Comments)    unknown   Trazodone Other (See Comments)    Agitation   Bupropion Anxiety  Methocarbamol Other (See Comments)    Agitation and stiffness   Tramadol Other (See Comments)    Nausea And Vomiting Reports she feels high     Past Medical History:  Diagnosis Date   Anxiety    Bladder infection    8/18   Chronic lower back pain    Chronic neck pain    Collagen vascular disease    COVID-19    Depression    Diverticulitis    GERD (gastroesophageal reflux disease)    Hyperlipidemia    Lupus    Overactive bladder     Review of systems:  Otherwise negative.    Physical Exam  Gen: Alert, oriented. Appears stated age.  HEENT: PERRLA. Lungs: No respiratory distress CV: RRR Abd: soft, benign, no masses Ext:  No edema    Planned procedures: Proceed with colonoscopy. The patient understands the nature of the planned procedure, indications, risks, alternatives and potential complications including but not limited to bleeding, infection, perforation, damage to internal organs and possible oversedation/side effects from anesthesia. The patient agrees and gives consent to proceed.  Please refer to procedure notes for findings, recommendations and patient disposition/instructions.     Tanya ONEIDA Schick, MD [facility]

## 2023-11-21 NOTE — Anesthesia Preprocedure Evaluation (Signed)
 Anesthesia Evaluation  Patient identified by MRN, date of birth, ID band Patient awake    Reviewed: Allergy & Precautions, NPO status , Patient's Chart, lab work & pertinent test results  Airway Mallampati: II  TM Distance: >3 FB Neck ROM: full    Dental  (+) Lower Dentures, Upper Dentures   Pulmonary neg pulmonary ROS, COPD,  COPD inhaler, Current Smoker   Pulmonary exam normal  + decreased breath sounds      Cardiovascular Exercise Tolerance: Good negative cardio ROS Normal cardiovascular exam Rhythm:Regular Rate:Normal     Neuro/Psych  Headaches  Anxiety     negative neurological ROS  negative psych ROS   GI/Hepatic negative GI ROS, Neg liver ROS,GERD  ,,  Endo/Other  negative endocrine ROS    Renal/GU negative Renal ROS  negative genitourinary   Musculoskeletal   Abdominal Normal abdominal exam  (+)   Peds negative pediatric ROS (+)  Hematology negative hematology ROS (+)   Anesthesia Other Findings Past Medical History: No date: Anxiety No date: Bladder infection     Comment:  8/18 No date: Chronic lower back pain No date: Chronic neck pain No date: Collagen vascular disease No date: COVID-19 No date: Depression No date: Diverticulitis No date: GERD (gastroesophageal reflux disease) No date: Hyperlipidemia No date: Lupus No date: Overactive bladder  Past Surgical History: No date: ABDOMINAL HYSTERECTOMY No date: COLONOSCOPY 07/03/2017: COLONOSCOPY WITH PROPOFOL ; N/A     Comment:  Procedure: COLONOSCOPY WITH PROPOFOL ;  Surgeon:               Gaylyn Gladis PENNER, MD;  Location: [facility] ENDOSCOPY;                Service: Endoscopy;  Laterality: N/A; 10/21/2017: COLONOSCOPY WITH PROPOFOL ; N/A     Comment:  Procedure: COLONOSCOPY WITH PROPOFOL ;  Surgeon:               Gaylyn Gladis PENNER, MD;  Location: [facility] ENDOSCOPY;                Service: Endoscopy;  Laterality: N/A; 10/21/2017:  ESOPHAGOGASTRODUODENOSCOPY; N/A     Comment:  Procedure: ESOPHAGOGASTRODUODENOSCOPY (EGD);  Surgeon:               Gaylyn Gladis PENNER, MD;  Location: [facility] ENDOSCOPY;                Service: Endoscopy;  Laterality: N/A; No date: TONSILLECTOMY  BMI    Body Mass Index: 21.82 kg/m      Reproductive/Obstetrics negative OB ROS                              Anesthesia Physical Anesthesia Plan  ASA: 3  Anesthesia Plan: General   Post-op Pain Management:    Induction: Intravenous  PONV Risk Score and Plan: Propofol  infusion and TIVA  Airway Management Planned: Natural Airway and Nasal Cannula  Additional Equipment:   Intra-op Plan:   Post-operative Plan:   Informed Consent: I have reviewed the patients History and Physical, chart, labs and discussed the procedure including the risks, benefits and alternatives for the proposed anesthesia with the patient or authorized representative who has indicated his/her understanding and acceptance.     Dental Advisory Given  Plan Discussed with: CRNA  Anesthesia Plan Comments:         Anesthesia Quick Evaluation

## 2023-11-21 NOTE — Op Note (Signed)
 [facility]astroenterology Patient Name: Tanya [name] Procedure Date: 11/21/2023 1:00 PM MRN: [id number] Account #: [id number] Date of Birth: [birth year] Admit Type: Outpatient Age: 63 Room: [facility] ENDO ROOM 3 Gender: Female Note Status: Finalized Instrument Name: Colon Scope [id number] Procedure:             Colonoscopy Indications:           Surveillance: Personal history of adenomatous polyps                         on last colonoscopy > 5 years ago, Incidental - Rectal                         bleeding, Incidental - Constipation Providers:             Ole Schick MD, MD Medicines:             Monitored Anesthesia Care Complications:         No immediate complications. Estimated blood loss:                         Minimal. Procedure:             Pre-Anesthesia Assessment:                        - Prior to the procedure, a History and Physical was                         performed, and patient medications and allergies were                         reviewed. The patient is competent. The risks and                         benefits of the procedure and the sedation options and                         risks were discussed with the patient. All questions                         were answered and informed consent was obtained.                         Patient identification and proposed procedure were                         verified by the physician, the nurse, the                         anesthesiologist, the anesthetist and the technician                         in the endoscopy suite. Mental Status Examination:                         alert and oriented. Airway Examination: normal                         oropharyngeal airway and neck mobility. Respiratory  Examination: clear to auscultation. CV Examination:                         normal. Prophylactic Antibiotics: The patient does not                         require prophylactic  antibiotics. Prior                         Anticoagulants: The patient has taken no anticoagulant                         or antiplatelet agents. ASA Grade Assessment: III - A                         patient with severe systemic disease. After reviewing                         the risks and benefits, the patient was deemed in                         satisfactory condition to undergo the procedure. The                         anesthesia plan was to use monitored anesthesia care                         (MAC). Immediately prior to administration of                         medications, the patient was re-assessed for adequacy                         to receive sedatives. The heart rate, respiratory                         rate, oxygen saturations, blood pressure, adequacy of                         pulmonary ventilation, and response to care were                         monitored throughout the procedure. The physical                         status of the patient was re-assessed after the                         procedure.                        After obtaining informed consent, the colonoscope was                         passed under direct vision. Throughout the procedure,                         the patient's blood pressure, pulse, and oxygen  saturations were monitored continuously. The                         Colonoscope was introduced through the anus and                         advanced to the the terminal ileum, with                         identification of the appendiceal orifice and IC                         valve. The colonoscopy was performed without                         difficulty. The patient tolerated the procedure well.                         The quality of the bowel preparation was adequate to                         identify polyps. The terminal ileum, ileocecal valve,                         appendiceal orifice, and rectum were  photographed. Findings:      The perianal and digital rectal examinations were normal.      The terminal ileum appeared normal.      Multiple large-mouthed and small-mouthed diverticula were found in the       sigmoid colon, descending colon, transverse colon, hepatic flexure,       ascending colon and cecum.      A 1 mm polyp was found in the transverse colon. The polyp was sessile.       The polyp was removed with a cold snare. Resection and retrieval were       complete. Estimated blood loss was minimal.      Two hyperplastic polyps were found in the rectum. The polyps were 2 to 3       mm in size. These polyps were removed with a cold snare. Resection and       retrieval were complete. Estimated blood loss was minimal.      Internal hemorrhoids were found during retroflexion. The hemorrhoids       were Grade I (internal hemorrhoids that do not prolapse).      The exam was otherwise without abnormality on direct and retroflexion       views. Impression:            - The examined portion of the ileum was normal.                        - Diverticulosis in the sigmoid colon, in the                         descending colon, in the transverse colon, at the                         hepatic flexure, in the ascending colon and in the  cecum.                        - One 1 mm polyp in the transverse colon, removed with                         a cold snare. Resected and retrieved.                        - Two 2 to 3 mm polyps in the rectum, removed with a                         cold snare. Resected and retrieved.                        - Internal hemorrhoids.                        - The examination was otherwise normal on direct and                         retroflexion views. Recommendation:        - Discharge patient to home.                        - Resume previous diet.                        - Continue present medications.                        - Await pathology  results.                        - Repeat colonoscopy for surveillance based on                         pathology results.                        - Return to referring physician as previously                         scheduled. Procedure Code(s):     --- Professional ---                        [phone], Colonoscopy, flexible; with removal of                         tumor(s), polyp(s), or other lesion(s) by snare                         technique Diagnosis Code(s):     --- Professional ---                        Z86.010, Personal history of colonic polyps                        K64.0, First degree hemorrhoids  D12.3, Benign neoplasm of transverse colon (hepatic                         flexure or splenic flexure)                        D12.8, Benign neoplasm of rectum                        K57.30, Diverticulosis of large intestine without                         perforation or abscess without bleeding CPT copyright 2022 American [employer]. All rights reserved. The codes documented in this report are preliminary and upon coder review may  be revised to meet current compliance requirements. Ole Schick MD, MD 11/21/2023 1:39:25 PM Number of Addenda: 0 Note Initiated On: 11/21/2023 1:00 PM Scope Withdrawal Time: 0 hours 10 minutes 14 seconds  Total Procedure Duration: 0 hours 16 minutes 9 seconds  Estimated Blood Loss:  Estimated blood loss was minimal.      [facility]

## 2023-11-21 NOTE — Anesthesia Postprocedure Evaluation (Signed)
 Anesthesia Post Note  Patient: Tanya [name]  Procedure(s) Performed: COLONOSCOPY EGD (ESOPHAGOGASTRODUODENOSCOPY) POLYPECTOMY, INTESTINE  Patient location during evaluation: PACU Anesthesia Type: General Level of consciousness: awake Pain management: satisfactory to patient Vital Signs Assessment: post-procedure vital signs reviewed and stable Respiratory status: spontaneous breathing Cardiovascular status: stable Anesthetic complications: no   No notable events documented.   Last Vitals:  Vitals:   11/21/23 1343 11/21/23 1353  BP: 109/72 116/66  Pulse: 79 70  Resp: 20 19  Temp:    SpO2: 96% 100%    Last Pain:  Vitals:   11/21/23 1353  TempSrc:   PainSc: 0-No pain                 VAN STAVEREN,Lyssa Hackley

## 2023-11-21 NOTE — Interval H&P Note (Signed)
 History and Physical Interval Note:  11/21/2023 1:03 PM  Tanya [name]  has presented today for surgery, with the diagnosis of epigastric pain.rectal pain,hx of adenomatous polyp of colon.  The various methods of treatment have been discussed with the patient and family. After consideration of risks, benefits and other options for treatment, the patient has consented to  Procedure(s): COLONOSCOPY (N/A) EGD (ESOPHAGOGASTRODUODENOSCOPY) (N/A) as a surgical intervention.  The patient's history has been reviewed, patient examined, no change in status, stable for surgery.  I have reviewed the patient's chart and labs.  Questions were answered to the patient's satisfaction.     Ole ONEIDA Schick  Ok to proceed with colonoscopy

## 2023-11-21 NOTE — Transfer of Care (Signed)
 Immediate Anesthesia Transfer of Care Note  Patient: Tanya [name]  Procedure(s) Performed: COLONOSCOPY EGD (ESOPHAGOGASTRODUODENOSCOPY) POLYPECTOMY, INTESTINE  Patient Location: PACU  Anesthesia Type:General  Level of Consciousness: sedated  Airway & Oxygen Therapy: Patient Spontanous Breathing  Post-op Assessment: Report given to RN and Post -op Vital signs reviewed and stable  Post vital signs: Reviewed and stable  Last Vitals:  Vitals Value Taken Time  BP    Temp 35.6 C 11/21/23 13:33  Pulse 66 11/21/23 13:34  Resp 18 11/21/23 13:34  SpO2 98 % 11/21/23 13:34  Vitals shown include unfiled device data.  Last Pain:  Vitals:   11/21/23 1333  TempSrc: Temporal  PainSc: 0-No pain         Complications: No notable events documented.

## 2023-11-21 NOTE — Op Note (Signed)
 [facility] Gastroenterology Patient Name: Tanya [name] Procedure Date: 11/21/2023 1:01 PM MRN: [id number] Account #: [id number] Date of Birth: 1960/02/02 Admit Type: Outpatient Age: 63 Room: [facility] ENDO ROOM 3 Gender: Female Note Status: Finalized Instrument Name: Endoscope [id number] Procedure:             Upper GI endoscopy Indications:           Epigastric abdominal pain Providers:             Ole Schick MD, MD Medicines:             Monitored Anesthesia Care Complications:         No immediate complications. Estimated blood loss:                         Minimal. Procedure:             Pre-Anesthesia Assessment:                        - Prior to the procedure, a History and Physical was                         performed, and patient medications and allergies were                         reviewed. The patient is competent. The risks and                         benefits of the procedure and the sedation options and                         risks were discussed with the patient. All questions                         were answered and informed consent was obtained.                         Patient identification and proposed procedure were                         verified by the physician, the nurse, the                         anesthesiologist, the anesthetist and the technician                         in the endoscopy suite. Mental Status Examination:                         alert and oriented. Airway Examination: normal                         oropharyngeal airway and neck mobility. Respiratory                         Examination: clear to auscultation. CV Examination:                         normal. Prophylactic Antibiotics: The patient does not  require prophylactic antibiotics. Prior                         Anticoagulants: The patient has taken no anticoagulant                         or antiplatelet agents. ASA Grade Assessment:  III - A                         patient with severe systemic disease. After reviewing                         the risks and benefits, the patient was deemed in                         satisfactory condition to undergo the procedure. The                         anesthesia plan was to use monitored anesthesia care                         (MAC). Immediately prior to administration of                         medications, the patient was re-assessed for adequacy                         to receive sedatives. The heart rate, respiratory                         rate, oxygen saturations, blood pressure, adequacy of                         pulmonary ventilation, and response to care were                         monitored throughout the procedure. The physical                         status of the patient was re-assessed after the                         procedure.                        After obtaining informed consent, the endoscope was                         passed under direct vision. Throughout the procedure,                         the patient's blood pressure, pulse, and oxygen                         saturations were monitored continuously. The Endoscope                         was introduced through the mouth, and advanced to the  second part of duodenum. The upper GI endoscopy was                         accomplished without difficulty. The patient tolerated                         the procedure well. Findings:      The examined esophagus was normal.      The entire examined stomach was normal. Biopsies were taken with a cold       forceps for Helicobacter pylori testing. Estimated blood loss was       minimal.      The examined duodenum was normal. Impression:            - Normal esophagus.                        - Normal stomach. Biopsied.                        - Normal examined duodenum. Recommendation:        - Discharge patient to home.                         - Resume previous diet.                        - Continue present medications.                        - Await pathology results.                        - Return to referring physician as previously                         scheduled. Procedure Code(s):     --- Professional ---                        [phone], Esophagogastroduodenoscopy, flexible,                         transoral; with biopsy, single or multiple Diagnosis Code(s):     --- Professional ---                        R10.13, Epigastric pain CPT copyright 2022 American Medical Association. All rights reserved. The codes documented in this report are preliminary and upon coder review may  be revised to meet current compliance requirements. Ole Schick MD, MD 11/21/2023 1:35:59 PM Number of Addenda: 0 Note Initiated On: 11/21/2023 1:01 PM Estimated Blood Loss:  Estimated blood loss was minimal.      [facility]

## 2023-11-24 LAB — SURGICAL PATHOLOGY

## 2023-12-01 ENCOUNTER — Ambulatory Visit: Admitting: Psychiatry

## 2023-12-05 ENCOUNTER — Ambulatory Visit
Admission: RE | Admit: 2023-12-05 | Discharge: 2023-12-05 | Disposition: A | Discharge status: Home / Self Care | Source: Ambulatory Visit | Attending: Gastroenterology | Admitting: Gastroenterology

## 2023-12-05 DIAGNOSIS — R1013 Epigastric pain: Secondary | ICD-10-CM | POA: Diagnosis present

## 2023-12-05 MED ORDER — TECHNETIUM TC 99M SULFUR COLLOID
2.1800 | Freq: Once | INTRAVENOUS | Status: AC | PRN
  Administered 2023-12-05: 2.18 via ORAL
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
## 2024-01-05 ENCOUNTER — Encounter: Payer: Self-pay | Admitting: Psychiatry

## 2024-01-05 ENCOUNTER — Ambulatory Visit: Admitting: Psychiatry

## 2024-01-05 ENCOUNTER — Other Ambulatory Visit: Payer: Self-pay

## 2024-01-05 VITALS — BP 102/80 | HR 93 | Temp 97.9°F | Ht 63.0 in | Wt 130.4 lb

## 2024-01-05 DIAGNOSIS — F3341 Major depressive disorder, recurrent, in partial remission: Secondary | ICD-10-CM | POA: Diagnosis not present

## 2024-01-05 DIAGNOSIS — F419 Anxiety disorder, unspecified: Secondary | ICD-10-CM

## 2024-01-05 MED ORDER — MIRTAZAPINE 15 MG PO TABS: 15.0000 mg | ORAL_TABLET | Freq: Every day | ORAL | 1 refills | Status: AC

## 2024-01-05 MED ORDER — DULOXETINE HCL 20 MG PO CPEP: 40.0000 mg | ORAL_CAPSULE | Freq: Every day | ORAL | 1 refills | Status: AC

## 2024-01-05 NOTE — Patient Instructions (Signed)
 Continue duloxetine  40 mg daily  Continue mirtazapine  15 mg at night Continue hydroxyzine  25 mg twice a day as needed for anxiety Next appointment- 2/9 at 2:30

## 2024-01-20 ENCOUNTER — Other Ambulatory Visit: Payer: Self-pay | Admitting: Pulmonary Disease

## 2024-02-17 ENCOUNTER — Other Ambulatory Visit: Payer: Self-pay | Admitting: Psychiatry

## 2024-03-01 ENCOUNTER — Ambulatory Visit: Admitting: Psychiatry
